# Patient Record
Sex: Female | Born: 1935 | Race: White | Hispanic: No | Smoking: Never smoker
Health system: Southern US, Community
[De-identification: ages and names within clinical notes are randomized; demographics above are authoritative.]

## PROBLEM LIST (undated history)

## (undated) DIAGNOSIS — G8929 Other chronic pain: Secondary | ICD-10-CM

## (undated) DIAGNOSIS — G629 Polyneuropathy, unspecified: Secondary | ICD-10-CM

## (undated) DIAGNOSIS — E119 Type 2 diabetes mellitus without complications: Secondary | ICD-10-CM

## (undated) DIAGNOSIS — E559 Vitamin D deficiency, unspecified: Secondary | ICD-10-CM

## (undated) DIAGNOSIS — E1140 Type 2 diabetes mellitus with diabetic neuropathy, unspecified: Secondary | ICD-10-CM

## (undated) DIAGNOSIS — I10 Essential (primary) hypertension: Secondary | ICD-10-CM

## (undated) DIAGNOSIS — M1990 Unspecified osteoarthritis, unspecified site: Secondary | ICD-10-CM

## (undated) DIAGNOSIS — K769 Liver disease, unspecified: Secondary | ICD-10-CM

## (undated) DIAGNOSIS — E7800 Pure hypercholesterolemia, unspecified: Secondary | ICD-10-CM

## (undated) DIAGNOSIS — Z973 Presence of spectacles and contact lenses: Secondary | ICD-10-CM

## (undated) DIAGNOSIS — M549 Dorsalgia, unspecified: Secondary | ICD-10-CM

## (undated) HISTORY — DX: Polyneuropathy, unspecified: G62.9

## (undated) HISTORY — DX: Essential (primary) hypertension: I10

## (undated) HISTORY — PX: APPENDECTOMY: SHX54

## (undated) HISTORY — DX: Pure hypercholesterolemia, unspecified: E78.00

## (undated) HISTORY — DX: Other chronic pain: G89.29

## (undated) HISTORY — DX: Type 2 diabetes mellitus without complications: E11.9

## (undated) HISTORY — PX: EYE SURGERY: SHX253

## (undated) HISTORY — DX: Unspecified osteoarthritis, unspecified site: M19.90

## (undated) HISTORY — PX: COLONOSCOPY: SHX174

## (undated) HISTORY — DX: Liver disease, unspecified: K76.9

## (undated) HISTORY — DX: Other chronic pain: M54.9

## (undated) HISTORY — DX: Vitamin D deficiency, unspecified: E55.9

## (undated) HISTORY — PX: ABDOMINAL HYSTERECTOMY: SHX81

## (undated) SURGERY — RELEASE, A1 PULLEY, FOR TRIGGER FINGER
Anesthesia: Regional | Laterality: Right

---

## 1965-04-25 HISTORY — PX: BREAST EXCISIONAL BIOPSY: SUR124

## 1991-04-26 HISTORY — PX: BACK SURGERY: SHX140

## 1998-07-13 ENCOUNTER — Ambulatory Visit (HOSPITAL_COMMUNITY): Admission: RE | Admit: 1998-07-13 | Discharge: 1998-07-13 | Payer: Self-pay | Admitting: Obstetrics & Gynecology

## 2000-01-07 ENCOUNTER — Encounter: Payer: Self-pay | Admitting: Family Medicine

## 2000-01-07 ENCOUNTER — Encounter: Admission: RE | Admit: 2000-01-07 | Discharge: 2000-01-07 | Payer: Self-pay | Admitting: Family Medicine

## 2000-12-08 ENCOUNTER — Encounter: Admission: RE | Admit: 2000-12-08 | Discharge: 2000-12-23 | Payer: Self-pay | Admitting: Anesthesiology

## 2001-01-09 ENCOUNTER — Encounter: Admission: RE | Admit: 2001-01-09 | Discharge: 2001-01-09 | Payer: Self-pay | Admitting: Family Medicine

## 2001-01-09 ENCOUNTER — Encounter: Payer: Self-pay | Admitting: Family Medicine

## 2001-02-08 ENCOUNTER — Ambulatory Visit (HOSPITAL_COMMUNITY): Admission: RE | Admit: 2001-02-08 | Discharge: 2001-02-08 | Payer: Self-pay | Admitting: Family Medicine

## 2001-04-05 ENCOUNTER — Encounter: Admission: RE | Admit: 2001-04-05 | Discharge: 2001-04-05 | Payer: Self-pay | Admitting: Family Medicine

## 2001-04-05 ENCOUNTER — Encounter: Payer: Self-pay | Admitting: Family Medicine

## 2001-12-25 ENCOUNTER — Encounter: Admission: RE | Admit: 2001-12-25 | Discharge: 2001-12-25 | Payer: Self-pay | Admitting: Family Medicine

## 2001-12-25 ENCOUNTER — Encounter: Payer: Self-pay | Admitting: Family Medicine

## 2002-08-03 ENCOUNTER — Encounter: Payer: Self-pay | Admitting: Emergency Medicine

## 2002-08-03 ENCOUNTER — Emergency Department (HOSPITAL_COMMUNITY): Admission: EM | Admit: 2002-08-03 | Discharge: 2002-08-03 | Payer: Self-pay | Admitting: Emergency Medicine

## 2002-12-24 ENCOUNTER — Encounter: Admission: RE | Admit: 2002-12-24 | Discharge: 2003-03-24 | Payer: Self-pay | Admitting: Family Medicine

## 2002-12-27 ENCOUNTER — Encounter: Payer: Self-pay | Admitting: Family Medicine

## 2002-12-27 ENCOUNTER — Encounter: Admission: RE | Admit: 2002-12-27 | Discharge: 2002-12-27 | Payer: Self-pay | Admitting: Family Medicine

## 2003-09-15 ENCOUNTER — Encounter: Admission: RE | Admit: 2003-09-15 | Discharge: 2003-12-14 | Payer: Self-pay | Admitting: Family Medicine

## 2004-01-27 ENCOUNTER — Encounter: Admission: RE | Admit: 2004-01-27 | Discharge: 2004-01-27 | Payer: Self-pay | Admitting: Family Medicine

## 2004-05-26 LAB — HM COLONOSCOPY

## 2004-11-24 ENCOUNTER — Encounter: Admission: RE | Admit: 2004-11-24 | Discharge: 2004-11-24 | Payer: Self-pay | Admitting: Family Medicine

## 2005-01-28 ENCOUNTER — Other Ambulatory Visit: Admission: RE | Admit: 2005-01-28 | Discharge: 2005-01-28 | Payer: Self-pay | Admitting: Family Medicine

## 2005-02-07 ENCOUNTER — Encounter: Admission: RE | Admit: 2005-02-07 | Discharge: 2005-02-07 | Payer: Self-pay | Admitting: Family Medicine

## 2005-11-02 ENCOUNTER — Encounter: Admission: RE | Admit: 2005-11-02 | Discharge: 2005-11-18 | Payer: Self-pay | Admitting: Family Medicine

## 2006-02-14 ENCOUNTER — Encounter: Admission: RE | Admit: 2006-02-14 | Discharge: 2006-02-14 | Payer: Self-pay | Admitting: Family Medicine

## 2006-07-19 ENCOUNTER — Ambulatory Visit: Admission: RE | Admit: 2006-07-19 | Discharge: 2006-07-19 | Payer: Self-pay | Admitting: Family Medicine

## 2006-07-19 ENCOUNTER — Ambulatory Visit: Payer: Self-pay | Admitting: Vascular Surgery

## 2006-07-19 ENCOUNTER — Encounter: Payer: Self-pay | Admitting: Vascular Surgery

## 2007-02-19 ENCOUNTER — Encounter: Admission: RE | Admit: 2007-02-19 | Discharge: 2007-02-19 | Payer: Self-pay | Admitting: Family Medicine

## 2008-02-20 ENCOUNTER — Encounter: Admission: RE | Admit: 2008-02-20 | Discharge: 2008-02-20 | Payer: Self-pay | Admitting: Family Medicine

## 2008-06-09 ENCOUNTER — Inpatient Hospital Stay (HOSPITAL_COMMUNITY): Admission: EM | Admit: 2008-06-09 | Discharge: 2008-06-10 | Payer: Self-pay | Admitting: Emergency Medicine

## 2009-02-24 ENCOUNTER — Encounter: Admission: RE | Admit: 2009-02-24 | Discharge: 2009-02-24 | Payer: Self-pay | Admitting: Family Medicine

## 2009-10-25 ENCOUNTER — Emergency Department (HOSPITAL_COMMUNITY): Admission: EM | Admit: 2009-10-25 | Discharge: 2009-10-25 | Payer: Self-pay | Admitting: Emergency Medicine

## 2009-11-06 ENCOUNTER — Encounter: Admission: RE | Admit: 2009-11-06 | Discharge: 2009-11-06 | Payer: Self-pay | Admitting: Family Medicine

## 2010-03-03 ENCOUNTER — Encounter: Admission: RE | Admit: 2010-03-03 | Discharge: 2010-03-03 | Payer: Self-pay | Admitting: Internal Medicine

## 2010-04-13 ENCOUNTER — Encounter
Admission: RE | Admit: 2010-04-13 | Discharge: 2010-04-13 | Payer: Self-pay | Source: Home / Self Care | Attending: Internal Medicine | Admitting: Internal Medicine

## 2010-07-11 LAB — POCT I-STAT, CHEM 8
BUN: 10 mg/dL (ref 6–23)
Calcium, Ion: 1.15 mmol/L (ref 1.12–1.32)
Chloride: 105 mEq/L (ref 96–112)
Creatinine, Ser: 0.9 mg/dL (ref 0.4–1.2)
Glucose, Bld: 139 mg/dL — ABNORMAL HIGH (ref 70–99)
HCT: 40 % (ref 36.0–46.0)
Hemoglobin: 13.6 g/dL (ref 12.0–15.0)
Potassium: 3.9 mEq/L (ref 3.5–5.1)
Sodium: 138 mEq/L (ref 135–145)
TCO2: 28 mmol/L (ref 0–100)

## 2010-07-11 LAB — POCT CARDIAC MARKERS
CKMB, poc: <1 ng/mL — ABNORMAL LOW (ref 1.0–8.0)
Myoglobin, poc: 71.7 ng/mL (ref 12–200)
Troponin i, poc: <0.05 ng/mL (ref 0.00–0.09)

## 2010-08-10 LAB — POCT I-STAT, CHEM 8
BUN: 28 mg/dL — ABNORMAL HIGH (ref 6–23)
Calcium, Ion: 0.92 mmol/L — ABNORMAL LOW (ref 1.12–1.32)
Chloride: 106 mEq/L (ref 96–112)
Creatinine, Ser: 0.7 mg/dL (ref 0.4–1.2)
Glucose, Bld: 247 mg/dL — ABNORMAL HIGH (ref 70–99)
HCT: 42 % (ref 36.0–46.0)
Hemoglobin: 14.3 g/dL (ref 12.0–15.0)
Potassium: 4.6 mEq/L (ref 3.5–5.1)
Sodium: 135 mEq/L (ref 135–145)
TCO2: 24 mmol/L (ref 0–100)

## 2010-08-10 LAB — DIFFERENTIAL
Basophils Absolute: 0 10*3/uL (ref 0.0–0.1)
Basophils Relative: 0 % (ref 0–1)
Eosinophils Absolute: 0.1 10*3/uL (ref 0.0–0.7)
Eosinophils Relative: 0 % (ref 0–5)
Lymphocytes Relative: 4 % — ABNORMAL LOW (ref 12–46)
Lymphs Abs: 0.8 10*3/uL (ref 0.7–4.0)
Monocytes Absolute: 0.5 10*3/uL (ref 0.1–1.0)
Monocytes Relative: 3 % (ref 3–12)
Neutro Abs: 17.4 10*3/uL — ABNORMAL HIGH (ref 1.7–7.7)
Neutrophils Relative %: 93 % — ABNORMAL HIGH (ref 43–77)

## 2010-08-10 LAB — BASIC METABOLIC PANEL
BUN: 8 mg/dL (ref 6–23)
CO2: 29 mEq/L (ref 19–32)
Calcium: 8.6 mg/dL (ref 8.4–10.5)
Chloride: 96 mEq/L (ref 96–112)
Creatinine, Ser: 0.72 mg/dL (ref 0.4–1.2)
GFR calc Af Amer: >60 mL/min (ref >60)
GFR calc non Af Amer: >60 mL/min (ref >60)
Glucose, Bld: 87 mg/dL (ref 70–99)
Potassium: 4.1 mEq/L (ref 3.5–5.1)
Sodium: 135 mEq/L (ref 135–145)

## 2010-08-10 LAB — CK TOTAL AND CKMB (NOT AT ARMC)
CK, MB: 0.8 ng/mL (ref 0.3–4.0)
Relative Index: INVALID (ref 0.0–2.5)
Total CK: 52 U/L (ref 7–177)

## 2010-08-10 LAB — CBC
HCT: 40.1 % (ref 36.0–46.0)
HCT: 36.3 % (ref 36.0–46.0)
Hemoglobin: 14 g/dL (ref 12.0–15.0)
Hemoglobin: 12.8 g/dL (ref 12.0–15.0)
MCHC: 35 g/dL (ref 30.0–36.0)
MCHC: 35.1 g/dL (ref 30.0–36.0)
MCV: 96.3 fL (ref 78.0–100.0)
MCV: 96.9 fL (ref 78.0–100.0)
Platelets: 211 10*3/uL (ref 150–400)
Platelets: 177 10*3/uL (ref 150–400)
RBC: 4.17 MIL/uL (ref 3.87–5.11)
RBC: 3.75 MIL/uL — ABNORMAL LOW (ref 3.87–5.11)
RDW: 12.8 % (ref 11.5–15.5)
RDW: 13.2 % (ref 11.5–15.5)
WBC: 18.7 10*3/uL — ABNORMAL HIGH (ref 4.0–10.5)
WBC: 12.4 10*3/uL — ABNORMAL HIGH (ref 4.0–10.5)

## 2010-08-10 LAB — COMPREHENSIVE METABOLIC PANEL
ALT: 14 U/L (ref 0–35)
AST: 40 U/L — ABNORMAL HIGH (ref 0–37)
Albumin: 3.7 g/dL (ref 3.5–5.2)
Alkaline Phosphatase: 64 U/L (ref 39–117)
BUN: 22 mg/dL (ref 6–23)
CO2: 25 mEq/L (ref 19–32)
Calcium: 9.3 mg/dL (ref 8.4–10.5)
Chloride: 100 mEq/L (ref 96–112)
Creatinine, Ser: 0.75 mg/dL (ref 0.4–1.2)
GFR calc Af Amer: >60 mL/min (ref >60)
GFR calc non Af Amer: >60 mL/min (ref >60)
Glucose, Bld: 256 mg/dL — ABNORMAL HIGH (ref 70–99)
Potassium: 4.5 mEq/L (ref 3.5–5.1)
Sodium: 137 mEq/L (ref 135–145)
Total Bilirubin: 1.3 mg/dL — ABNORMAL HIGH (ref 0.3–1.2)
Total Protein: 7.1 g/dL (ref 6.0–8.3)

## 2010-08-10 LAB — GLUCOSE, CAPILLARY
Glucose-Capillary: 171 mg/dL — ABNORMAL HIGH (ref 70–99)
Glucose-Capillary: 187 mg/dL — ABNORMAL HIGH (ref 70–99)
Glucose-Capillary: 200 mg/dL — ABNORMAL HIGH (ref 70–99)
Glucose-Capillary: 245 mg/dL — ABNORMAL HIGH (ref 70–99)
Glucose-Capillary: 188 mg/dL — ABNORMAL HIGH (ref 70–99)
Glucose-Capillary: 230 mg/dL — ABNORMAL HIGH (ref 70–99)
Glucose-Capillary: 85 mg/dL (ref 70–99)

## 2010-08-10 LAB — URINALYSIS, ROUTINE W REFLEX MICROSCOPIC
Bilirubin Urine: NEGATIVE
Glucose, UA: >1000 mg/dL — AB
Hgb urine dipstick: NEGATIVE
Ketones, ur: 15 mg/dL — AB
Nitrite: NEGATIVE
Protein, ur: 100 mg/dL — AB
Specific Gravity, Urine: 1.033 — ABNORMAL HIGH (ref 1.005–1.030)
Urobilinogen, UA: 0.2 mg/dL (ref 0.0–1.0)
pH: 6.5 (ref 5.0–8.0)

## 2010-08-10 LAB — LIPID PANEL
Cholesterol: 210 mg/dL — ABNORMAL HIGH (ref 0–200)
HDL: 43 mg/dL (ref >39)
LDL Cholesterol: 107 mg/dL — ABNORMAL HIGH (ref 0–99)
Total CHOL/HDL Ratio: 4.9 RATIO
Triglycerides: 298 mg/dL — ABNORMAL HIGH (ref <150)
VLDL: 60 mg/dL — ABNORMAL HIGH (ref 0–40)

## 2010-08-10 LAB — CARDIAC PANEL(CRET KIN+CKTOT+MB+TROPI)
CK, MB: 1.2 ng/mL (ref 0.3–4.0)
CK, MB: 1.2 ng/mL (ref 0.3–4.0)
CK, MB: 0.9 ng/mL (ref 0.3–4.0)
Relative Index: 1.1 (ref 0.0–2.5)
Relative Index: INVALID (ref 0.0–2.5)
Relative Index: 0.8 (ref 0.0–2.5)
Total CK: 106 U/L (ref 7–177)
Total CK: 79 U/L (ref 7–177)
Total CK: 119 U/L (ref 7–177)
Troponin I: <0.01 ng/mL (ref 0.00–0.06)
Troponin I: <0.01 ng/mL (ref 0.00–0.06)
Troponin I: <0.01 ng/mL (ref 0.00–0.06)

## 2010-08-10 LAB — POCT CARDIAC MARKERS
CKMB, poc: <1 ng/mL — ABNORMAL LOW (ref 1.0–8.0)
CKMB, poc: <1 ng/mL — ABNORMAL LOW (ref 1.0–8.0)
Myoglobin, poc: 73 ng/mL (ref 12–200)
Myoglobin, poc: 75.3 ng/mL (ref 12–200)
Troponin i, poc: <0.05 ng/mL (ref 0.00–0.09)
Troponin i, poc: <0.05 ng/mL (ref 0.00–0.09)

## 2010-08-10 LAB — APTT: aPTT: 30 seconds (ref 24–37)

## 2010-08-10 LAB — URINE MICROSCOPIC-ADD ON

## 2010-08-10 LAB — PROTIME-INR
INR: 1 (ref 0.00–1.49)
Prothrombin Time: 13.3 seconds (ref 11.6–15.2)

## 2010-08-10 LAB — TSH: TSH: 0.664 u[IU]/mL (ref 0.350–4.500)

## 2010-08-10 LAB — LIPASE, BLOOD: Lipase: 20 U/L (ref 11–59)

## 2010-08-10 LAB — D-DIMER, QUANTITATIVE (NOT AT ARMC): D-Dimer, Quant: 0.35 ug/mL-FEU (ref 0.00–0.48)

## 2010-08-10 LAB — HEMOGLOBIN A1C
Hgb A1c MFr Bld: 8 % — ABNORMAL HIGH (ref 4.6–6.1)
Mean Plasma Glucose: 183 mg/dL

## 2010-08-10 LAB — TROPONIN I: Troponin I: 0.01 ng/mL (ref 0.00–0.06)

## 2010-09-07 NOTE — H&P (Signed)
NAMEJAHARI, [name]                ACCOUNT NO.:  [id number]   MEDICAL RECORD NO.:  [id number]          PATIENT TYPE:  EMS   LOCATION:  MAJO                         FACILITY:  [facility]   PHYSICIAN:  Farris Has, MDDATE OF BIRTH:  02-Apr-1930   DATE OF ADMISSION:  06/09/2008  DATE OF DISCHARGE:                              HISTORY & PHYSICAL   PRIMARY CARE PHYSICIAN:  Emeline General. White, M.D.   CHIEF COMPLAINT:  Abdominal pain and chest pain.  The patient is a 77-  year-old female with history of diabetes and hypertension who reports  this past one week she had been having intermittent chest pain which is  not positional, sometimes worse with deep inspiration, sometimes not,  but he had been hurting consistently all day.  She cannot quite qualify  it or describe it.  She described it as a hurts.  States that her left  arm has been hurting as well.  Pain does not appear to be worse with  exertion.  She usually does not get chest pain or shortness of breath  with any exertion.  Has no coronary artery disease history.  Also, the  patient has been endorsing abdominal pain that has been happening for  about as long.  No fever, no chills, no constipation, no diarrhea.  The  abdominal pain is also hard to describe.  She says that there is some  right upper quadrant epigastric pain and as well as some suprapubic  pain.  She notes that there may have been some blood in her urine for  the past few days.  Otherwise, review of systems unremarkable.  No  shortness of breath.  No wheezing.  No lower extremity swelling.  She  does have history of peripheral neuropathy for which she takes  Neurontin, and that has been under control.   SOCIAL HISTORY:  The patient does not smoke or drink and does not abuse  drugs.  Lives at home with her husband.   FAMILY HISTORY:  Noncontributory.   PAST MEDICAL HISTORY:  1. Chronic back pain.  2. Diabetes.  3. Hypercholesteremia.  4. Hypertension.  5.  Neuropathy.   ALLERGIES:  MORPHINE.   MEDICATIONS:  1. Actos 30 mg daily.  2. Darvocet as needed.  3. Neurontin 300 mg q.i.d.  4. Benicar 20 mg daily.  5. Aspirin 81 mg daily.  6. Humalog 20 mg subcu p.m.  7. Metformin 500 mg b.i.d.  8. The patient may be taking some other medications but cannot      remember what.   PHYSICAL EXAMINATION:  VITAL SIGNS:  Temperature 99.9, blood pressure  179/64, pulse 88, respirations 19, satting 94% on room air.  The patient  appears to be in no acute distress, currently states that she is chest  pain free.  HEAD:  Nontraumatic.  Somewhat dry mucous membranes but normal skin  turgor.  LUNGS:  Clear to auscultation bilaterally.  HEART:  Regular rate and rhythm.  No murmurs, rubs or gallops.  ABDOMEN:  Slight epigastric tenderness noted.  Otherwise soft.  No  rebound tenderness noted.  Normal  bowel sounds throughout.  LOWER EXTREMITIES:  No clubbing, cyanosis or edema.  NEUROLOGIC:  Intact.   LABORATORY DATA:  White blood cell count 17.7, hemoglobin 14, sodium  137, potassium 4.5, creatinine 0.75 with a bilirubin 1.3, alk-phos 64,  AST 40, ALT 14, lipase 20.  Cardiac enzymes negative.  UA showing 21-50  white blood cells.  Chest x-ray showing pulmonary nodule.  Will need a  repeat to confirm.  CT scan of abdomen and pelvis showing liver lesion  which cannot be further qualified on the CT scan, MRI recommended, and  also gallbladder is distended, but no evidence of infection.  EKG showed  normal sinus rhythm.   ASSESSMENT/PLAN:  This is a 77 year old female with past medical history  of diabetes.  Presents with UTI, chest pain and abdominal pain with  possible liver lesion and distended gallbladder.   1. Chest pain.  The patient has a risk factors such as diabetes and      hypertension.  Will cycle cardiac enzymes.  The chest pain is      currently hard to quantify.  Says it has been persistent at least a      week or so.  Will check fasting  lipid panel check hemoglobin A1c.      Since sometimes it is pleuritic, will check a D-dimer.  2. Abdominal pain.  Etiology is not quite clear.  It is suprapubic      pain.  Could be related to UTI.  The epigastric pain may be related      to gallbladder distention revealed no evidence of cholecystitis.      Will continue to watch CMP.  Will obtain right upper quadrant      ultrasound to try to further evaluate gallbladder.  If there is      evidence of acute cholecystitis, would need surgical consult.  3. Liver lesions.  Will need MRI in the future.  This could be done      either as an outpatient or inpatient if the patient is staying here      for a long period time.  4. Diabetes.  Will switch to Lantus while in-house and continue      sliding scale insulin.  Will hold Actos.  5. Urinary tract infection.  Will cover with Cipro for now and await      urine culture.  6. New diabetic neuropathy.  Continue Neurontin.  7. Prophylaxis.  Protonix and Lovenox.      Farris Has, MD  Electronically Signed     AVD/MEDQ  D:  06/09/2008  T:  06/09/2008  Job:  [id number]   cc:   Emeline General. Dema Severin, M.D.

## 2010-09-07 NOTE — Consult Note (Signed)
NAMERIANA, COLLINSON                ACCOUNT NO.:  [id number]   MEDICAL RECORD NO.:  [id number]          PATIENT TYPE:  INP   LOCATION:  2010                         FACILITY:  [facility]   PHYSICIAN:  Jerline Pain, MD      DATE OF BIRTH:  [birth year]   DATE OF CONSULTATION:  06/09/2008  DATE OF DISCHARGE:                                 CONSULTATION   PRIMARY CARE PHYSICIAN:  Emeline General. White, MD   REFERRING PHYSICIAN:  Corinna L. Conley Canal, MD, [facility] hospitalist.   REASON FOR REFERRAL:  Evaluation of chest pain.   HISTORY OF PRESENT ILLNESS:  This is a 77 year old female with diabetes,  hypertension, hypercholesterolemia here with approximately 3- to 4-week  history of abdominal discomfort as well as chronic cough, which led to  nausea and vomiting and concomitant left breast chest discomfort.  The  chest pain occurred after 3 weeks of coughing and was not associated  with any diaphoresis, shortness of breath.  The chest pain has been  indolent, usually lasting several weeks in duration.  Occasionally, the  chest pain is under her left breast and radiates down her left arm.  Duration can be hours to days.  No specific exacerbating factors such as  exertion.  Pain is mild to moderate in intensity.  However currently,  she does not have any significant discomfort.  She is not tender to  palpation.  She is denying any rash.  No significant fevers.  Yesterday  after eating breakfast, she started belching and vomiting and aching  all over her abdomen.   She had had some suprapubic and right upper  quadrant pain.  Since she has been here, she has been diagnosed with  urinary tract infection.  Ultrasound of her gallbladder shows distended  gallbladder with no stones.  She does have hepatic steatosis.  Gallbladder was distended on CT of her abdomen and pelvis.  Her EKG was  sinus rhythm without any abnormalities.  There was no change from prior  ECG.  No ST changes.  No Q waves.  She has a son  who in his early 53s  had an  myocardial infarction.  Several years ago, she had had a stress  test, which was normal per the patient.  Her husband sees Dr. Mar Daring  and he is status post bypass.   PAST MEDICAL HISTORY:  1. Hypertension.  2. Hyperlipidemia.  3. Diabetes with lower extremity neuropathy.  4. Chronic back pain.   ALLERGIES:  MORPHINE.   MEDICATIONS:  1. Aspirin 81 mg a day.  2. Pravastatin 40 mg a day.  3. Cipro.  4. Neurontin 600 mg nightly.  5. Lantus 10 units subcu nightly.  6. Benicar 20 mg a day.  7. Protonix 40 mg a day, currently on IV heparin for DVT prophylaxis.   At home, she is also on:  1. Actos 30 mg a day.  2. Neurontin 300 mg 4 times a day.  3. Metformin 500 mg twice a day.   SOCIAL HISTORY:  She lives in [place] with her husband.  Note,  denies any tobacco, alcohol, or drug use ever.   FAMILY HISTORY:  No early family history of coronary artery disease;  however, she has 11 siblings and 61 of them have died of cancer.   REVIEW OF SYSTEMS:  Positive for fatigue, chest pain, nausea, vomiting,  abdominal pain.  She denies any hematemesis, melena, fevers, rashes,  syncope, palpitations.  Unless specified above, all other 12 review of  systems negative.   PHYSICAL EXAMINATION:  VITAL SIGNS:  Temperature 97.9, pulse 80,  respirations 16, blood pressure 151/71, oxygen saturation 96% on room  air.  GENERAL:  Alert and oriented x3, in no acute distress, pleasant.  EYES:  Well-perfused conjunctivae.  EOMI.  No scleral icterus.  NECK:  Supple.  No lymphadenopathy.  No thyromegaly.  No carotid bruits.  No JVD noted.  CARDIOVASCULAR:  Regular rate and rhythm without any appreciable  murmurs, rubs, or gallops.  Normal PMI.  LUNGS:  Clear to auscultation bilaterally.  Normal respiratory effort.  No wheezes.  No rales.  ABDOMEN:  Mild tenderness diffusely with positive bowel sounds.  No  hepatosplenomegaly noted.  No bruits.  EXTREMITIES:  No  clubbing, cyanosis, or edema.  Normal distal pulses.  SKIN:  Warm, dry, and intact.  No rashes.  PSYCH:  Normal affect.  NEUROLOGIC:  Nonfocal.   LABORATORY DATA:  Hemoglobin A1c 8.0 mildly elevated.  TSH 0.664, which  is normal.  Cardiac biomarkers last set 10:35 this morning were normal.  First set was at just after midnight and was normal.  She has had a 10-  hour span.  Total cholesterol 210, triglycerides elevated at 298, HDL  43, LDL 107.  PT/PTT were normal.  D-dimer was normal at 0.35.  Urinalysis positive.  Lipase was negative.  Liver function showed  bilirubin of 1.3, alkaline phosphatase of 64, AST of 40, ALT of 14.  Albumin was normal at 3.7.  Chest x-ray shows elevation of the right  hemidiaphragm.  No active disease, personally reviewed.  Ultrasound of  the abdomen shows gallbladder is distended without any gallstones, wall  thickening, or sonographic Murphy sign.  There is no biliary dilatation.  There is hepatic steatosis without demonstrated focal lesion on  ultrasound.  There was no demonstrated acute abdominal finding.  CT of  her abdomen showed prior hysterectomy.  No lymphadenopathy and prior  cage grafts at L4 and S1.  No acute abnormalities.  EKG, personally  viewed as described above in HPI, normal sinus rhythm without any  abnormalities.   ASSESSMENT AND PLAN:  This is a 77 year old female with diabetes,  hypertension, hyperlipidemia with atypical chest pain and abdominal  discomfort, chronic.  1. Chest pain - atypical.  So far ECG and cardiac biomarkers are      reassuring.  I would like to obtain one more set prior to      discharge.  Given her multiple cardiac risk factors including      diabetes mellitus which is a coronary artery disease equivalent, I      do believe that she warrants further cardiac testing with nuclear      stress test which can be performed on an outpatient basis to allow      expedited discharge.  Continue to modify risk factors.  I  agree      with aspirin and statin medication pravastatin.  I am curious if      she has had any prior statin intolerances in the past.  She is also  taking fish oil at home given her hypertriglyceridemia.  She may      require more potent formulation such as fenofibrate in the future.      In regards to her abdominal discomfort, she does have a distended      gallbladder; however, no appreciable stones are present.  In      regards to her diabetes, her hemoglobin A1c was 8.0, which is      elevated.  Goal was less than 7.  She may require increase in her      Lantus or other insulin at home.  2. Leukocytosis - white count was elevated earlier this morning at      18.7 indicative of an inflammatory response.  So far, there are no      acute abnormalities in regards to her abdominal process.  She does      have urinary tract infection, which may be the cause of this      leukocytosis.  She is currently being treated accordingly.      Jerline Pain, MD  Electronically Signed     MCS/MEDQ  D:  06/09/2008  T:  06/10/2008  Job:  [id number]   cc:   Corinna L. Conley Canal, [place]. Dema Severin, M.D.

## 2010-09-10 NOTE — H&P (Signed)
[facility]  Patient:    [name]                       MRN: [id number] Adm. Date:  [id number] Attending:  Nicholaus Bloom CC:         Biagio Borg, M.D.   History and Physical  NEW PATIENT EVALUATION  HISTORY OF PRESENT ILLNESS:  [name] is a very pleasant 77 year old who was sent to [place] by Dr. Jacklynn Ganong for evaluation and management of chronic back pain syndrome on the basis of degenerative disk disease.  The patient states that she has a history of back pain which began in the late 1990s when she was initially followed by Dr. Alfred Levins with degenerative disk disease.  She was sent to Dr. Gilmer Mor, who gave her epidural steroid injections, which partially helped her pain, and eventually she required surgical intervention by Dr. Glenna Fellows in 1998.  Subsequent to this she went back to Dr. Asa Lente, but Dr. Carma Lair group no longer covered partners, so she was sent to Dr. [name], who has been treating her for the past year with Ultram and nortriptyline, and she has done fairly well with pain levels around 3/10.  She would like to get off these medications nevertheless.  The patient describes her pain as a discomfort which is increased by movements, weightbearing, or housework, and improved by sitting still or relaxing.  There is no numbness or tingling, bowel or bladder incontinence, or weakness.  She has dysesthesias of her feet, which have been worked up with nerve conduction studies by Dr. Sanjuan Dame, but the results are not available.  She has had an MRI in the past year, and the results of this were not available at the time of dictation.  She does not recall other medications used, but she does recall that Dr. Sanjuan Dame did not want her on Vioxx.  She is not currently taking a nonsteroidal anti-inflammatory agent.  MEDICATIONS:  Ultram 50 mg one to two p.o. q.6h., nortriptyline  25 mg two in the evening, Lipitor, and  Premarin.  ALLERGIES:  MORPHINE causes itching.  FAMILY HISTORY:  Positive for diabetes, coronary artery disease, emphysema, cancer, hypertension, peripheral vascular disease, and strokes.  PAST SURGICAL HISTORY:  Significant for three C-sections, lumpectomy, and her back surgery.  ACTIVE MEDICAL PROBLEMS:  Hyperlipidemia and arthralgias of her hands on the basis of osteoarthritis.  SOCIAL HISTORY:  The patient is a nonsmoker, nondrinker.  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  She does part-time.  REVIEW OF SYSTEMS:  GENERAL:  Negative.  HEAD:  Significant for rare headaches.  EYES:  Negative.  NOSE, MOUTH, THROAT:  Negative.  EARS: Negative.  PULMONARY:  Negative.  CARDIOVASCULAR:  Negative. GASTROINTESTINAL:  Negative.  LIVER:  Negative.  GENITOURINARY:  Negative. MUSCULOSKELETAL:  See HPI.  NEUROLOGIC:  See HPI.  No history of seizure or stroke.  CUTANEOUS:  Significant for redness of her feet, which has been present for about the past two months, which is improved by putting her feet in cold water.  HEMATOLOGIC:  Negative.  ENDOCRINE:  Negative.  PSYCHIATRIC: Negative.  ALLERGY/IMMUNOLOGIC:  Positive for allergies to bug bites.  PHYSICAL EXAMINATION:  VITAL SIGNS:  Blood pressure 140/63, heart rate is 83, respiratory rate 16, O2 saturation is 96%, pain level is 3/10.  GENERAL:  This is a pleasant female who looks younger than her stated age.  HEENT:  Head was  normocephalic, atraumatic.  Eyes:  Extraocular movements intact, conjunctivae and sclerae clear.  Nose:  Patent nares without discharge.  Oropharynx is free of lesions.  NECK:  Supple without lymphadenopathy.  CHEST:  Lungs were clear to auscultation and percussion.  BREASTS, ABDOMEN, PELVIC, RECTAL:  Not performed.  BACK:  Well-healed surgical scar with intact forward flexion and hyperextension.  Straight leg raise signs were negative.  EXTREMITIES:  The patient demonstrated some mild bony enlargement of the  PIPs and DIPs of her hands with good range of motion of her joints.  Radial pulses and dorsalis pedis pulses were 2+ and symmetric.  She did have some reddish discoloration of her feet but no increased warmth.  NEUROLOGIC:  The patient was oriented to person, place, time, and reason for visit.  Deep tendon reflexes were symmetric in the upper and lower extremities.  They were hypoactive in the lower extremities.  Straight leg raise signs were negative.  Motor was 5/5 with symmetric bulk and tone. Sensory was significant for attenuated vibratory sense in both lower extremities, with intact pinprick.  Coordination was grossly intact.  IMPRESSION: 1. Chronic low back pain syndrome on the basis of lumbar degenerative disk    disease, status post lumbar laminectomy. 2. Dysesthesias of the feet with unknown results of nerve conduction studies,    which seem improved with nortriptyline. 3. Other health problems per Dr. Valere Dross, which include hyperlipidemia and    perimenopausal.  DISPOSITION: 1. I advised the patient that she should continue on her current medical    regimen, since it seems to be helping her fairly significantly, and I have    no problem with writing for the Ultram in the generic form and gave her a    prescription for 50 mg one to two p.o. q.6h. p.r.n., #200 with two refills. 2. She does not need a refill on her nortriptyline, but I encouraged her to    continue with this. 3. Trial of Celebrex 100 mg one p.o. q.d. versus glucosamine with MSG.    She will try one or the other.  I gave her enough samples to last two    months with the Celebrex and made her fully aware of the potential side    effects of this. 4. We will obtain the records from Dr. Sanjuan Dame and the results of her nerve    conduction studies if possible. 5. Follow up with me in eight weeks. 6. I have encouraged her to get [name] book on the back to see    whether we can get her on a more regimented  exercise regimen and see if    we can help with her back problems to a greater extent with this route.     She seems very open to this approach.  She plans to follow up with [place]    up at the new office at [facility].  DD:  12/08/00 TD:  12/09/00 Job: [id number]

## 2011-01-21 ENCOUNTER — Other Ambulatory Visit: Payer: Self-pay | Admitting: Internal Medicine

## 2011-01-21 DIAGNOSIS — Z1231 Encounter for screening mammogram for malignant neoplasm of breast: Secondary | ICD-10-CM

## 2011-02-16 ENCOUNTER — Emergency Department (HOSPITAL_COMMUNITY)
Admission: EM | Admit: 2011-02-16 | Discharge: 2011-02-16 | Disposition: A | Discharge status: Home / Self Care | Payer: Medicare Other | Attending: Emergency Medicine | Admitting: Emergency Medicine

## 2011-02-16 ENCOUNTER — Other Ambulatory Visit: Payer: Self-pay | Admitting: Internal Medicine

## 2011-02-16 ENCOUNTER — Emergency Department (HOSPITAL_COMMUNITY): Payer: Medicare Other

## 2011-02-16 ENCOUNTER — Inpatient Hospital Stay (INDEPENDENT_AMBULATORY_CARE_PROVIDER_SITE_OTHER)
Admission: RE | Admit: 2011-02-16 | Discharge: 2011-02-16 | Disposition: A | Discharge status: Home / Self Care | Payer: Medicare Other | Source: Ambulatory Visit | Attending: Family Medicine | Admitting: Family Medicine

## 2011-02-16 DIAGNOSIS — M79609 Pain in unspecified limb: Secondary | ICD-10-CM | POA: Insufficient documentation

## 2011-02-16 DIAGNOSIS — Z79899 Other long term (current) drug therapy: Secondary | ICD-10-CM

## 2011-02-16 DIAGNOSIS — E7800 Pure hypercholesterolemia, unspecified: Secondary | ICD-10-CM | POA: Insufficient documentation

## 2011-02-16 DIAGNOSIS — E119 Type 2 diabetes mellitus without complications: Secondary | ICD-10-CM | POA: Insufficient documentation

## 2011-02-16 DIAGNOSIS — M549 Dorsalgia, unspecified: Secondary | ICD-10-CM | POA: Insufficient documentation

## 2011-02-16 DIAGNOSIS — N644 Mastodynia: Secondary | ICD-10-CM

## 2011-02-16 DIAGNOSIS — Z7982 Long term (current) use of aspirin: Secondary | ICD-10-CM | POA: Insufficient documentation

## 2011-02-16 DIAGNOSIS — N63 Unspecified lump in unspecified breast: Secondary | ICD-10-CM

## 2011-02-16 DIAGNOSIS — Z794 Long term (current) use of insulin: Secondary | ICD-10-CM | POA: Insufficient documentation

## 2011-02-16 DIAGNOSIS — R51 Headache: Secondary | ICD-10-CM

## 2011-02-16 DIAGNOSIS — G8929 Other chronic pain: Secondary | ICD-10-CM | POA: Insufficient documentation

## 2011-02-16 DIAGNOSIS — I10 Essential (primary) hypertension: Secondary | ICD-10-CM | POA: Insufficient documentation

## 2011-02-16 DIAGNOSIS — M5020 Other cervical disc displacement, unspecified cervical region: Secondary | ICD-10-CM | POA: Insufficient documentation

## 2011-02-16 DIAGNOSIS — M25519 Pain in unspecified shoulder: Secondary | ICD-10-CM | POA: Insufficient documentation

## 2011-02-16 DIAGNOSIS — N6459 Other signs and symptoms in breast: Secondary | ICD-10-CM

## 2011-02-16 LAB — GLUCOSE, CAPILLARY: Glucose-Capillary: 257 mg/dL — ABNORMAL HIGH (ref 70–99)

## 2011-02-22 ENCOUNTER — Other Ambulatory Visit: Payer: Self-pay | Admitting: Neurosurgery

## 2011-02-22 DIAGNOSIS — M5020 Other cervical disc displacement, unspecified cervical region: Secondary | ICD-10-CM

## 2011-02-25 ENCOUNTER — Ambulatory Visit
Admission: RE | Admit: 2011-02-25 | Discharge: 2011-02-25 | Disposition: A | Discharge status: Home / Self Care | Payer: Medicare Other | Source: Ambulatory Visit | Attending: Neurosurgery | Admitting: Neurosurgery

## 2011-02-25 DIAGNOSIS — M5020 Other cervical disc displacement, unspecified cervical region: Secondary | ICD-10-CM

## 2011-02-25 MED ORDER — IOHEXOL 300 MG/ML  SOLN
1.0000 mL | Freq: Once | INTRAMUSCULAR | Status: AC | PRN
  Administered 2011-02-25: 1 mL via EPIDURAL

## 2011-02-25 MED ORDER — TRIAMCINOLONE ACETONIDE 40 MG/ML IJ SUSP (RADIOLOGY)
60.0000 mg | Freq: Once | INTRAMUSCULAR | Status: AC
  Administered 2011-02-25: 60 mg via EPIDURAL

## 2011-03-01 ENCOUNTER — Ambulatory Visit
Admit: 2011-03-01 | Discharge: 2011-03-01 | Disposition: A | Discharge status: Home / Self Care | Payer: Medicare Other | Attending: Internal Medicine | Admitting: Internal Medicine

## 2011-03-01 DIAGNOSIS — N63 Unspecified lump in unspecified breast: Secondary | ICD-10-CM

## 2011-03-01 DIAGNOSIS — N644 Mastodynia: Secondary | ICD-10-CM

## 2011-03-07 ENCOUNTER — Ambulatory Visit: Payer: Self-pay

## 2011-03-08 ENCOUNTER — Other Ambulatory Visit: Payer: Self-pay | Admitting: Internal Medicine

## 2011-03-08 ENCOUNTER — Ambulatory Visit
Admission: RE | Admit: 2011-03-08 | Discharge: 2011-03-08 | Disposition: A | Discharge status: Home / Self Care | Payer: Medicare Other | Source: Ambulatory Visit | Attending: Internal Medicine | Admitting: Internal Medicine

## 2011-03-08 DIAGNOSIS — E041 Nontoxic single thyroid nodule: Secondary | ICD-10-CM

## 2011-03-10 ENCOUNTER — Other Ambulatory Visit: Payer: Self-pay | Admitting: Internal Medicine

## 2011-03-10 DIAGNOSIS — E041 Nontoxic single thyroid nodule: Secondary | ICD-10-CM

## 2011-03-22 ENCOUNTER — Other Ambulatory Visit (HOSPITAL_COMMUNITY)
Admission: RE | Admit: 2011-03-22 | Discharge: 2011-03-22 | Disposition: A | Discharge status: Home / Self Care | Payer: Medicare Other | Source: Ambulatory Visit | Attending: Diagnostic Radiology | Admitting: Diagnostic Radiology

## 2011-03-22 ENCOUNTER — Ambulatory Visit
Admission: RE | Admit: 2011-03-22 | Discharge: 2011-03-22 | Disposition: A | Discharge status: Home / Self Care | Payer: Medicare Other | Source: Ambulatory Visit | Attending: Internal Medicine | Admitting: Internal Medicine

## 2011-03-22 DIAGNOSIS — E049 Nontoxic goiter, unspecified: Secondary | ICD-10-CM | POA: Insufficient documentation

## 2011-03-22 DIAGNOSIS — E041 Nontoxic single thyroid nodule: Secondary | ICD-10-CM

## 2011-06-10 ENCOUNTER — Other Ambulatory Visit: Payer: Self-pay | Admitting: Internal Medicine

## 2011-06-14 ENCOUNTER — Ambulatory Visit
Admission: RE | Admit: 2011-06-14 | Discharge: 2011-06-14 | Disposition: A | Discharge status: Home / Self Care | Payer: Medicare Other | Source: Ambulatory Visit | Attending: Internal Medicine | Admitting: Internal Medicine

## 2011-06-14 ENCOUNTER — Encounter: Payer: Self-pay | Admitting: Gastroenterology

## 2011-06-20 ENCOUNTER — Other Ambulatory Visit (INDEPENDENT_AMBULATORY_CARE_PROVIDER_SITE_OTHER): Payer: Medicare Other

## 2011-06-20 ENCOUNTER — Ambulatory Visit (INDEPENDENT_AMBULATORY_CARE_PROVIDER_SITE_OTHER): Payer: Medicare Other | Admitting: Gastroenterology

## 2011-06-20 ENCOUNTER — Encounter: Payer: Self-pay | Admitting: Gastroenterology

## 2011-06-20 DIAGNOSIS — R109 Unspecified abdominal pain: Secondary | ICD-10-CM

## 2011-06-20 LAB — CBC WITH DIFFERENTIAL/PLATELET
Basophils Absolute: 0.1 10*3/uL (ref 0.0–0.1)
Basophils Relative: 0.5 % (ref 0.0–3.0)
Eosinophils Absolute: 0.3 10*3/uL (ref 0.0–0.7)
Eosinophils Relative: 1.9 % (ref 0.0–5.0)
HCT: 41.1 % (ref 36.0–46.0)
Hemoglobin: 13.6 g/dL (ref 12.0–15.0)
Lymphocytes Relative: 29.8 % (ref 12.0–46.0)
Lymphs Abs: 3.9 10*3/uL (ref 0.7–4.0)
MCHC: 33.1 g/dL (ref 30.0–36.0)
MCV: 95.7 fl (ref 78.0–100.0)
Monocytes Absolute: 1 10*3/uL (ref 0.1–1.0)
Monocytes Relative: 7.2 % (ref 3.0–12.0)
Neutro Abs: 8 10*3/uL — ABNORMAL HIGH (ref 1.4–7.7)
Neutrophils Relative %: 60.6 % (ref 43.0–77.0)
Platelets: 307 10*3/uL (ref 150.0–400.0)
RBC: 4.3 Mil/uL (ref 3.87–5.11)
RDW: 12.4 % (ref 11.5–14.6)
WBC: 13.2 10*3/uL — ABNORMAL HIGH (ref 4.5–10.5)

## 2011-06-20 LAB — COMPREHENSIVE METABOLIC PANEL
ALT: 23 U/L (ref 0–35)
AST: 34 U/L (ref 0–37)
Albumin: 4.5 g/dL (ref 3.5–5.2)
Alkaline Phosphatase: 54 U/L (ref 39–117)
BUN: 19 mg/dL (ref 6–23)
CO2: 30 mEq/L (ref 19–32)
Calcium: 10.4 mg/dL (ref 8.4–10.5)
Chloride: 101 mEq/L (ref 96–112)
Creatinine, Ser: 1.3 mg/dL — ABNORMAL HIGH (ref 0.4–1.2)
GFR: 42.71 mL/min — ABNORMAL LOW (ref >60.00)
Glucose, Bld: 208 mg/dL — ABNORMAL HIGH (ref 70–99)
Potassium: 5 mEq/L (ref 3.5–5.1)
Sodium: 139 mEq/L (ref 135–145)
Total Bilirubin: 0.3 mg/dL (ref 0.3–1.2)
Total Protein: 7.9 g/dL (ref 6.0–8.3)

## 2011-06-20 NOTE — Patient Instructions (Addendum)
We will get records from Dr. Amedeo Plenty, [facility], for colonoscopy reports, pathology reports. You will be set up for a CT scan of abdomen and pelvis with IV and oral contrast for left sided abdominal pains. You will have labs checked today in the basement lab.  Please head down after you check out with the front desk  (cbc, cmet).     You have been scheduled for a CT scan of the abdomen and pelvis at [facility] ([address]---this is in the same building as Press photographer).   You are scheduled on 06/21/11 at 1 pm . You should arrive 15 minutes prior to your appointment time for registration. Please follow the written instructions below on the day of your exam:  WARNING: IF YOU ARE ALLERGIC TO IODINE/X-RAY DYE, PLEASE NOTIFY RADIOLOGY IMMEDIATELY AT [phone]! YOU WILL BE GIVEN A 13 HOUR PREMEDICATION PREP.  1) Do not eat or drink anything after 9 am  (4 hours prior to your test) 2) You have been given 2 bottles of oral contrast to drink. The solution may taste better if refrigerated, but do NOT add ice or any other liquid to this solution. Shake  well before drinking.    Drink 1 bottle of contrast @ 11 am  (2 hours prior to your exam)  Drink 1 bottle of contrast @ 12 pm  (1 hour prior to your exam)  You may take any medications as prescribed with a small amount of water except for the following: Metformin, Glucophage, Glucovance, Avandamet, Riomet, Fortamet, Actoplus Met, Janumet, Glumetza or Metaglip. The above medications must be held the day of the exam AND 48 hours after the exam.  The purpose of you drinking the oral contrast is to aid in the visualization of your intestinal tract. The contrast solution may cause some diarrhea. Before your exam is started, you will be given a small amount of fluid to drink. Depending on your individual set of symptoms, you may also receive an intravenous injection of x-ray contrast/dye. Plan on being at [facility] for 30 minutes  or long, depending on the type of exam you are having performed.  If you have any questions regarding your exam or if you need to reschedule, you may call the CT department at [phone] between the hours of 8:00 am and 5:00 pm, Monday-Friday.  ________________________________________________________________________

## 2011-06-20 NOTE — Progress Notes (Signed)
HPI: This is a   very pleasant 78 year old woman who is here with her husband today. I am meeting her for the first time  Had headache and abd pains at the same time.  She was given medicine for blood pressure medicine (benicar) and was told to come here for her abd pains.  Controlling the blood pressure helped her headaches.   She was told to take benicar.    She takes Aspirin.    She has belching whenever she eats, burping and the pains in peribump pains (left greater than right).  The pains in abd started about 3 weeks.  No nausea.  + anorexia.  The pain was present all the time, seemed worse after eating.  She has never had pains like this.  No fevers or chills.   Stopped meloxicam 2 weeks ago.   She is on tramadol, 81mg  asa.  darvocet is on her list but says she does not take it.    She has had colonoscopies:   [facility] Dr. Teena Irani has performed at least 2 of them.  She takes magnesium pill every day for chronic constipation.  But this past weekend diarrhrea.   The diarrhea has subsided though.  IMPRESSION from recent abd ultrasound:  Negative for gallstones. Fatty infiltration of the liver.   Review of systems: Pertinent positive and negative review of systems were noted in the above HPI section. Complete review of systems was performed and was otherwise normal.    Past Medical History  Diagnosis Date  . Liver lesion   . Chronic back pain   . Diabetes mellitus   . Hypercholesteremia   . Hypertension   . Neuropathy     Past Surgical History  Procedure Date  . Abdominal hysterectomy   . Cesarean section     x 3   . Back surgery     Current Outpatient Prescriptions  Medication Sig Dispense Refill  . aspirin 81 MG tablet Take 81 mg by mouth daily.      Marland Kitchen gabapentin (NEURONTIN) 300 MG capsule Take 300 mg by mouth 4 (four) times daily.      . Insulin Lispro, Human, (HUMALOG Craig) Inject 20 Units into the skin every evening.      . metFORMIN  (GLUCOPHAGE) 500 MG tablet Take 500 mg by mouth 2 (two) times daily with a meal.      . olmesartan (BENICAR) 20 MG tablet Take 20 mg by mouth daily.      . pioglitazone (ACTOS) 30 MG tablet Take 30 mg by mouth daily.      . propoxyphene-acetaminophen (DARVOCET A500) 100-500 MG per tablet Take 1 tablet by mouth as needed.        Allergies as of 06/20/2011 - Review Complete 06/20/2011  Allergen Reaction Noted  . Morphine and related Other (See Comments) 02/25/2011    Family History  Problem Relation Age of Onset  . Colon cancer Neg Hx   . Liver disease Sister   . Diabetes Sister     History   Social History  . Marital Status: Married    Spouse Name: N/A    Number of Children: 3  . Years of Education: N/A   Occupational History  . Retired    Social History Main Topics  . Smoking status: Never Smoker   . Smokeless tobacco: Never Used  . Alcohol Use: No  . Drug Use: No  . Sexually Active: Not on file   Other Topics Concern  .  Not on file   Social History Narrative   Daily caffeine        Physical Exam: BP 134/68  Pulse 60  Ht 5\' 3"  (1.6 m)  Wt 161 lb (73.029 kg)  BMI 28.52 kg/m2 Constitutional: generally well-appearing Psychiatric: alert and oriented x3 Eyes: extraocular movements intact Mouth: oral pharynx moist, no lesions Neck: supple no lymphadenopathy Cardiovascular: heart regular rate and rhythm Lungs: clear to auscultation bilaterally Abdomen: soft, minimal left-sided tenderness, nondistended, no obvious ascites, no peritoneal signs, normal bowel sounds, nicely healed midline incision from several previous surgeries Extremities: no lower extremity edema bilaterally Skin: no lesions on visible extremities    Assessment and plan: 78 y.o. female with  recent left-sided abdominal pain  We will get records sent over from her previous gastroenterologist, Dr. Amedeo Plenty. I am suspicious that she might have diverticulitis however she has no fevers and she is  only very minimally tender on the left side.  We will set up CT scan abdomen and pelvis with IV and oral contrast and lab tests including CBC and complete metabolic profile.

## 2011-06-21 ENCOUNTER — Ambulatory Visit (INDEPENDENT_AMBULATORY_CARE_PROVIDER_SITE_OTHER)
Admission: RE | Admit: 2011-06-21 | Discharge: 2011-06-21 | Disposition: A | Discharge status: Home / Self Care | Payer: Medicare Other | Source: Ambulatory Visit | Attending: Gastroenterology | Admitting: Gastroenterology

## 2011-06-21 DIAGNOSIS — R109 Unspecified abdominal pain: Secondary | ICD-10-CM

## 2011-06-21 MED ORDER — IOHEXOL 300 MG/ML  SOLN
80.0000 mL | Freq: Once | INTRAMUSCULAR | Status: AC | PRN
  Administered 2011-06-21: 80 mL via INTRAVENOUS

## 2011-07-07 ENCOUNTER — Telehealth: Payer: Self-pay | Admitting: Gastroenterology

## 2011-07-07 NOTE — Telephone Encounter (Signed)
Colonoscopy Dr. Amedeo Plenty, 01/2005, done for routine risk screening; "diverticulosis in sigmoid colon" no polyps or cancers. He recommended recall colonoscopy at 10 year interval which I agree with.   Chong Sicilian, she needs recall colonoscopy 01/2015.

## 2011-07-07 NOTE — Telephone Encounter (Signed)
Recall has been put in EPIC

## 2011-10-26 ENCOUNTER — Other Ambulatory Visit: Payer: Self-pay | Admitting: Dermatology

## 2011-11-16 ENCOUNTER — Emergency Department (HOSPITAL_COMMUNITY)
Admission: EM | Admit: 2011-11-16 | Discharge: 2011-11-16 | Disposition: A | Discharge status: Home / Self Care | Payer: Medicare Other | Attending: Emergency Medicine | Admitting: Emergency Medicine

## 2011-11-16 ENCOUNTER — Emergency Department (HOSPITAL_COMMUNITY): Payer: Medicare Other

## 2011-11-16 ENCOUNTER — Encounter (HOSPITAL_COMMUNITY): Payer: Self-pay | Admitting: *Deleted

## 2011-11-16 DIAGNOSIS — R079 Chest pain, unspecified: Secondary | ICD-10-CM | POA: Insufficient documentation

## 2011-11-16 LAB — COMPREHENSIVE METABOLIC PANEL
ALT: 13 U/L (ref 0–35)
AST: 19 U/L (ref 0–37)
Albumin: 3.8 g/dL (ref 3.5–5.2)
Alkaline Phosphatase: 45 U/L (ref 39–117)
BUN: 29 mg/dL — ABNORMAL HIGH (ref 6–23)
CO2: 25 mEq/L (ref 19–32)
Calcium: 9.7 mg/dL (ref 8.4–10.5)
Chloride: 106 mEq/L (ref 96–112)
Creatinine, Ser: 1.29 mg/dL — ABNORMAL HIGH (ref 0.50–1.10)
GFR calc Af Amer: 44 mL/min — ABNORMAL LOW (ref >90)
GFR calc non Af Amer: 38 mL/min — ABNORMAL LOW (ref >90)
Glucose, Bld: 155 mg/dL — ABNORMAL HIGH (ref 70–99)
Potassium: 4 mEq/L (ref 3.5–5.1)
Sodium: 142 mEq/L (ref 135–145)
Total Bilirubin: 0.1 mg/dL — ABNORMAL LOW (ref 0.3–1.2)
Total Protein: 7 g/dL (ref 6.0–8.3)

## 2011-11-16 LAB — TROPONIN I: Troponin I: <0.3 ng/mL (ref <0.30)

## 2011-11-16 LAB — CBC
HCT: 31.9 % — ABNORMAL LOW (ref 36.0–46.0)
Hemoglobin: 11 g/dL — ABNORMAL LOW (ref 12.0–15.0)
MCH: 31.7 pg (ref 26.0–34.0)
MCHC: 34.5 g/dL (ref 30.0–36.0)
MCV: 91.9 fL (ref 78.0–100.0)
Platelets: 279 10*3/uL (ref 150–400)
RBC: 3.47 MIL/uL — ABNORMAL LOW (ref 3.87–5.11)
RDW: 12.6 % (ref 11.5–15.5)
WBC: 9.8 10*3/uL (ref 4.0–10.5)

## 2011-11-16 LAB — POCT I-STAT TROPONIN I
Troponin i, poc: 0 ng/mL (ref 0.00–0.08)
Troponin i, poc: 0.01 ng/mL (ref 0.00–0.08)

## 2011-11-16 LAB — CK TOTAL AND CKMB (NOT AT ARMC)
CK, MB: 2.8 ng/mL (ref 0.3–4.0)
Relative Index: 2.5 (ref 0.0–2.5)
Total CK: 111 U/L (ref 7–177)

## 2011-11-16 LAB — LIPASE, BLOOD: Lipase: 24 U/L (ref 11–59)

## 2011-11-16 LAB — PROTIME-INR
INR: 1.04 (ref 0.00–1.49)
Prothrombin Time: 13.8 seconds (ref 11.6–15.2)

## 2011-11-16 LAB — APTT: aPTT: 31 seconds (ref 24–37)

## 2011-11-16 LAB — GLUCOSE, CAPILLARY: Glucose-Capillary: 136 mg/dL — ABNORMAL HIGH (ref 70–99)

## 2011-11-16 MED ORDER — HYDROCODONE-ACETAMINOPHEN 5-325 MG PO TABS: 1.0000 | ORAL_TABLET | ORAL | Status: AC | PRN

## 2011-11-16 MED ORDER — HYDROCODONE-ACETAMINOPHEN 5-325 MG PO TABS
2.0000 | ORAL_TABLET | Freq: Once | ORAL | Status: AC
  Administered 2011-11-16: 1 via ORAL
  Filled 2011-11-16: qty 2

## 2011-11-16 MED ORDER — FAMOTIDINE IN NACL 20-0.9 MG/50ML-% IV SOLN
20.0000 mg | Freq: Once | INTRAVENOUS | Status: AC
  Administered 2011-11-16: 20 mg via INTRAVENOUS
  Filled 2011-11-16: qty 50

## 2011-11-16 NOTE — ED Notes (Signed)
Received report and care of patient from [place]. Patient settled and oriented to room. She states there is no change in her pain. Underneath her breastbone all the way across her chest.... Steady, hard pain... Level about 6.

## 2011-11-16 NOTE — ED Notes (Signed)
Care transferred and report given to Melody, RN.

## 2011-11-16 NOTE — ED Provider Notes (Addendum)
History     CSN: [id number]  Arrival date & time 11/16/11  0458   First MD Initiated Contact with Patient 11/16/11 0459      Chief Complaint  Patient presents with  . Chest Pain    (Consider location/radiation/quality/duration/timing/severity/associated sxs/prior treatment) Patient is a 78 y.o. female presenting with chest pain. The history is provided by the patient.  Chest Pain The chest pain began 6 - 12 hours ago. Duration of episode(s) is 5 hours. Chest pain occurs constantly. The chest pain is worsening. The pain is associated with eating. At its most intense, the pain is at 7/10. The pain is currently at 2/10. The severity of the pain is mild. The quality of the pain is described as burning and aching. The pain radiates to the epigastrium. Primary symptoms include nausea. Pertinent negatives for primary symptoms include no cough, no wheezing, no palpitations and no abdominal pain. She tried nitroglycerin and aspirin for the symptoms.  Her past medical history is significant for diabetes and hypertension.  Her family medical history is significant for connective tissue disease in family.     Past Medical History  Diagnosis Date  . Liver lesion   . Chronic back pain   . Diabetes mellitus   . Hypercholesteremia   . Hypertension   . Neuropathy   . Arthritis     Past Surgical History  Procedure Date  . Abdominal hysterectomy   . Cesarean section     x 3   . Back surgery     Family History  Problem Relation Age of Onset  . Colon cancer Neg Hx   . Liver disease Sister   . Diabetes Sister     History  Substance Use Topics  . Smoking status: Never Smoker   . Smokeless tobacco: Never Used  . Alcohol Use: No    OB History    Grav Para Term Preterm Abortions TAB SAB Ect Mult Living                  Review of Systems  Respiratory: Negative for cough and wheezing.   Cardiovascular: Positive for chest pain. Negative for palpitations.  Gastrointestinal:  Positive for nausea. Negative for abdominal pain.  All other systems reviewed and are negative.    Allergies  Morphine and related  Home Medications   Current Outpatient Rx  Name Route Sig Dispense Refill  . ASPIRIN 81 MG PO TABS Oral Take 81 mg by mouth daily.    Marland Kitchen VITAMIN D3 2000 UNITS PO TABS Oral Take 1 capsule by mouth 2 (two) times daily.    . FENOFIBRATE MICRONIZED 134 MG PO CAPS Oral Take 134 mg by mouth daily.    Marland Kitchen GABAPENTIN 300 MG PO CAPS Oral Take 300 mg by mouth 2 (two) times daily.     Marland Kitchen HYDROCHLOROTHIAZIDE 12.5 MG PO CAPS Oral Take 12.5 mg by mouth daily.    . INSULIN GLARGINE 100 UNIT/ML [place] SOLN Subcutaneous Inject 40 Units into the skin daily.    . INSULIN LISPRO (HUMAN) 100 UNIT/ML [place] SOLN Subcutaneous Inject 10 Units into the skin 3 (three) times daily before meals.    Marland Kitchen METFORMIN HCL 500 MG PO TABS Oral Take 500 mg by mouth 2 (two) times daily with a meal.    . OCUVITE PO Oral Take by mouth as directed.    Marland Kitchen TRAMADOL HCL 50 MG PO TABS Oral Take 50 mg by mouth as directed.    Marland Kitchen VITAMIN B-12 1000 MCG  PO TABS Oral Take 1,000 mcg by mouth 2 (two) times daily.      BP 161/49  Pulse 60  Temp 98 F (36.7 C) (Oral)  Resp 16  SpO2 97%  Physical Exam  Constitutional: She is oriented to person, place, and time. She appears well-developed and well-nourished.  HENT:  Head: Normocephalic and atraumatic.  Eyes: Conjunctivae and EOM are normal. Pupils are equal, round, and reactive to light.  Neck: Normal range of motion.  Cardiovascular: Normal rate, regular rhythm and normal heart sounds.   Pulmonary/Chest: Effort normal and breath sounds normal.  Abdominal: Soft. Bowel sounds are normal.  Musculoskeletal: Normal range of motion.  Neurological: She is alert and oriented to person, place, and time.  Skin: Skin is warm and dry.  Psychiatric: She has a normal mood and affect. Her behavior is normal.    ED Course  Procedures (including critical care time)   Labs  Reviewed  CBC  COMPREHENSIVE METABOLIC PANEL  APTT  PROTIME-INR  TROPONIN I  CK TOTAL AND CKMB   No results found.   No diagnosis found.   Date: 11/16/2011  Rate: 60  Rhythm: normal sinus rhythm  QRS Axis: normal  Intervals: normal  ST/T Wave abnormalities: normal  Conduction Disutrbances: none  Narrative Interpretation: unremarkable     MDM  + epigastric chest pain.  Improved with nitro.  Labs benign x 1 .  + risk factors for cad.  appropriate for cdu.  Will obs, and reassess        Aaron Edelman, MD 12/05/11 2303  Aaron Edelman, MD 01/18/12 Lurena Nida

## 2011-11-16 NOTE — ED Notes (Signed)
Per EMS - pt from home, pt states pain began approx 10pm yesterday evening, progressively worse. Pain is non-radiating, steady/constant pain, epigastric pain. Worse w/ position change - EMS w/ EKG showing heart block - pt denies any cardiac hx. Pt w/ cbg of 147. Pt denies shortness of breath or diaphoresis - some intermittent nausea. Pain decreased s/p x4 0.4mg  SL nitro - pt also given 324mg  ASA.

## 2011-11-16 NOTE — ED Provider Notes (Signed)
Medical screening examination/treatment/procedure(s) were conducted as a shared visit with non-physician practitioner(s) and myself.  I personally evaluated the patient during the encounter  [name] game plan was sorted out by myself and the mid-level wasn't clear at turnover with the expectation was. However the patient and having the chest pain for more than 10 hours so a third troponin was ordered and that was negative patient was be discharged home with a cardiology followup and followup with her primary care Dr. based on 12 hours worth of chest pain without any change in the troponin it is unlikely that it was cardiac event. Patient has had persistent chest pain throughout the ED stay.  Mervin Kung, MD 11/16/11 [phone]

## 2011-11-16 NOTE — ED Notes (Signed)
Pt reports left lower chest pain that radiates up into her chest that began yesterday evening after dinner, pt denies shortness of breath or nausea, skin warm and dry. Pt denies any cardiac or GI hx. Pt describes pain as constant discomfort.

## 2011-11-16 NOTE — ED Notes (Signed)
Patient requested one Vicodin instead of two. Believes one will be enough. PA agreed.

## 2011-11-16 NOTE — ED Provider Notes (Signed)
9:15 AM Assumed care of patient in the CDU from Dr. Wetzel Bjornstad.  Patient began having chest pain around 10 PM last evening.  Pain located just inferior to both breasts.  She describes the pain as a pressure and reports that it does not radiate.  Pain has been constant, but is improving.  She has never had pain like this before.  Pain not associated with nausea, vomiting, diaphoresis, numbness, tingling, dizziness, syncope, or SOB.  She was brought in by EMS and was given Aspirin and NG x 3.  She reports that the NG helped with the pain somewhat.  PMH significant for HTN, hyperlipidemia, and DM.  Patient denies prior cardiac history.  No acute changes on EKG.  Troponin negative x 2.  Reassessed patient.  She reports that she is still having chest pain at this time.  She continues to deny any associated symptoms.  Patient in no acute distress.  VSS, Heart RRR, Lungs CTAB, chest wall tender to palpation just inferior to both breasts at the area where she is having pain, epigastrium tender to palpation, no LE edema, DP pulse 2+ bilaterally.  Discussed plan with Dr. Rogene Houston.  He is recommending giving the patient pain medication, CXR, getting another troponin, and having the patient follow up with Cardiology outpatient.  10:09 AM Discussed plan with patient.  She is in agreement with the plan.  Patient now reports that she was wearing a bra all day yesterday that was very tight.  She is wondering if this contributed to the pain.  Pain located in the area that the underwire of a bra would have been.  Third troponin negative, which was approximately 12 hours after the initial onset of pain.  No acute findings on EKG.  CXR negative. Therefore, feel that patient can be discharged home.  Patient instructed to follow up with PCP and Cardiology.  Patient in agreement with plan.    Sherlyn Lees Linden, PA-C 11/16/11 1256

## 2011-12-23 ENCOUNTER — Other Ambulatory Visit: Payer: Self-pay | Admitting: Orthopaedic Surgery

## 2012-01-05 ENCOUNTER — Encounter (HOSPITAL_BASED_OUTPATIENT_CLINIC_OR_DEPARTMENT_OTHER): Payer: Self-pay | Admitting: *Deleted

## 2012-01-05 NOTE — Progress Notes (Signed)
Pt diabetic-seeing pcp 9/13-will see if they can do lab Told to have a late even snack-clear liq until 6am dos-do not take any insulin am of surgery

## 2012-01-06 NOTE — H&P (Signed)
[name] is an 78 y.o. female.   Chief Complaint: right ring finger trigger HPI:[name] is complaining of a right ring trigger finger for the last number of months almost a year.  She has had multiple injections done in our office that helped only temporarily and she is now requesting surgery to take care of Kelsey Dennisthe most recent injection was done December 21, 2011.  We have discussed proceeding with a right ring finger trigger release to take care of [name].  Past Medical History  Diagnosis Date  . Liver lesion   . Chronic back pain   . Diabetes mellitus   . Hypercholesteremia   . Hypertension   . Neuropathy   . Arthritis   . Diabetic neuropathy   . Wears glasses     Past Surgical History  Procedure Date  . Abdominal hysterectomy   . Cesarean section     x 3   . Back surgery 1993    lumb lam-fusion  . Appendectomy   . Colonoscopy   . Eye surgery     both cataracts    Family History  Dennis Relation Age of Onset  . Colon cancer Neg Hx   . Liver disease Sister   . Diabetes Sister    Social History:  reports that she has never smoked. She has never used smokeless tobacco. She reports that she does not drink alcohol or use illicit drugs.  Allergies:  Allergies  Allergen Reactions  . Morphine And Related Other (See Comments)    fuzzy feeling    No prescriptions prior to admission    No results found for this or any previous visit (from the past 48 hour(s)). No results found.  Review of Systems  Constitutional: Negative.   HENT: Negative.   Eyes: Negative.   Respiratory: Negative.   Cardiovascular: Negative.   Gastrointestinal: Negative.   Genitourinary: Negative.   Musculoskeletal: Negative.   Skin: Negative.   Neurological: Negative.   Endo/Heme/Allergies: Negative.   Psychiatric/Behavioral: Negative.     Height 5\' 3"  (1.6 m), weight 68.04 kg (150 lb). Physical Exam  Constitutional: She appears well-nourished.  HENT:    Head: Atraumatic.  Eyes: EOM are normal.  Neck: Normal range of motion.  Cardiovascular: Regular rhythm.   Respiratory: Breath sounds normal.  GI: Bowel sounds are normal.  Musculoskeletal:       Right hand: Right ring finger catches in the palm on a regular basis.  She also has pain over the flexor tendon sheath to direct palpation.  Good neurovascular status and good capillary refill.  Neurological: She is alert.  Skin: Skin is warm.  Psychiatric: She has a normal mood and affect.     Assessment/Plan Assessment: Right ring trigger finger injected multiple times continuing to trigger. Plan: [name] needs a trigger finger release.  We have reviewed the risks of anesthesia and infection and neurovascular injury.  We have also discussed her recovery and the potential need for physical therapy postoperatively.  [name] R 01/06/2012, 12:45 PM

## 2012-01-10 ENCOUNTER — Encounter (HOSPITAL_BASED_OUTPATIENT_CLINIC_OR_DEPARTMENT_OTHER): Payer: Self-pay | Admitting: *Deleted

## 2012-01-10 ENCOUNTER — Ambulatory Visit (HOSPITAL_BASED_OUTPATIENT_CLINIC_OR_DEPARTMENT_OTHER)
Admission: RE | Admit: 2012-01-10 | Discharge: 2012-01-10 | Disposition: A | Discharge status: Home / Self Care | Payer: Medicare Other | Source: Ambulatory Visit | Attending: Orthopaedic Surgery | Admitting: Orthopaedic Surgery

## 2012-01-10 ENCOUNTER — Encounter (HOSPITAL_BASED_OUTPATIENT_CLINIC_OR_DEPARTMENT_OTHER)
Admission: RE | Disposition: A | Discharge status: Home / Self Care | Payer: Self-pay | Source: Ambulatory Visit | Attending: Orthopaedic Surgery

## 2012-01-10 ENCOUNTER — Ambulatory Visit (HOSPITAL_BASED_OUTPATIENT_CLINIC_OR_DEPARTMENT_OTHER): Payer: Medicare Other | Admitting: *Deleted

## 2012-01-10 DIAGNOSIS — Z794 Long term (current) use of insulin: Secondary | ICD-10-CM | POA: Insufficient documentation

## 2012-01-10 DIAGNOSIS — E1149 Type 2 diabetes mellitus with other diabetic neurological complication: Secondary | ICD-10-CM | POA: Insufficient documentation

## 2012-01-10 DIAGNOSIS — M6530 Trigger finger, unspecified finger: Secondary | ICD-10-CM

## 2012-01-10 DIAGNOSIS — I10 Essential (primary) hypertension: Secondary | ICD-10-CM | POA: Insufficient documentation

## 2012-01-10 DIAGNOSIS — E1142 Type 2 diabetes mellitus with diabetic polyneuropathy: Secondary | ICD-10-CM | POA: Insufficient documentation

## 2012-01-10 HISTORY — DX: Type 2 diabetes mellitus with diabetic neuropathy, unspecified: E11.40

## 2012-01-10 HISTORY — PX: TRIGGER FINGER RELEASE: SHX641

## 2012-01-10 HISTORY — DX: Presence of spectacles and contact lenses: Z97.3

## 2012-01-10 LAB — POCT HEMOGLOBIN-HEMACUE: Hemoglobin: 10.8 g/dL — ABNORMAL LOW (ref 12.0–15.0)

## 2012-01-10 LAB — GLUCOSE, CAPILLARY: Glucose-Capillary: 65 mg/dL — ABNORMAL LOW (ref 70–99)

## 2012-01-10 SURGERY — RELEASE, A1 PULLEY, FOR TRIGGER FINGER
Anesthesia: Regional | Site: Finger | Laterality: Right | Wound class: Clean

## 2012-01-10 MED ORDER — FENTANYL CITRATE 0.05 MG/ML IJ SOLN
INTRAMUSCULAR | Status: DC | PRN
  Administered 2012-01-10: 1 ug via INTRAVENOUS

## 2012-01-10 MED ORDER — MIDAZOLAM HCL 5 MG/5ML IJ SOLN
INTRAMUSCULAR | Status: DC | PRN
  Administered 2012-01-10: 0.5 mg via INTRAVENOUS

## 2012-01-10 MED ORDER — OXYCODONE HCL 5 MG/5ML PO SOLN: 5.0000 mg | Freq: Once | ORAL | Status: DC | PRN

## 2012-01-10 MED ORDER — LACTATED RINGERS IV SOLN
INTRAVENOUS | Status: DC
  Administered 2012-01-10: 10:00:00 via INTRAVENOUS

## 2012-01-10 MED ORDER — BUPIVACAINE HCL (PF) 0.5 % IJ SOLN
INTRAMUSCULAR | Status: DC | PRN
  Administered 2012-01-10: 4 mL

## 2012-01-10 MED ORDER — PROPOFOL 10 MG/ML IV BOLUS
INTRAVENOUS | Status: DC | PRN
  Administered 2012-01-10: 200 mg via INTRAVENOUS

## 2012-01-10 MED ORDER — HYDROCODONE-ACETAMINOPHEN 5-325 MG PO TABS: 1.0000 | ORAL_TABLET | Freq: Four times a day (QID) | ORAL | Status: DC | PRN

## 2012-01-10 MED ORDER — CEFAZOLIN SODIUM-DEXTROSE 2-3 GM-% IV SOLR
2.0000 g | INTRAVENOUS | Status: AC
  Administered 2012-01-10: 2 g via INTRAVENOUS

## 2012-01-10 MED ORDER — HYDROMORPHONE HCL PF 1 MG/ML IJ SOLN: 0.2500 mg | INTRAMUSCULAR | Status: DC | PRN

## 2012-01-10 MED ORDER — ONDANSETRON HCL 4 MG/2ML IJ SOLN: 4.0000 mg | Freq: Once | INTRAMUSCULAR | Status: DC | PRN

## 2012-01-10 MED ORDER — ONDANSETRON HCL 4 MG/2ML IJ SOLN
INTRAMUSCULAR | Status: DC | PRN
  Administered 2012-01-10: 4 mg via INTRAVENOUS

## 2012-01-10 MED ORDER — LIDOCAINE HCL (CARDIAC) 20 MG/ML IV SOLN
INTRAVENOUS | Status: DC | PRN
  Administered 2012-01-10: 25 mg via INTRAVENOUS

## 2012-01-10 MED ORDER — OXYCODONE HCL 5 MG PO TABS: 5.0000 mg | ORAL_TABLET | Freq: Once | ORAL | Status: DC | PRN

## 2012-01-10 MED ORDER — CHLORHEXIDINE GLUCONATE 4 % EX LIQD: 60.0000 mL | Freq: Once | CUTANEOUS | Status: DC

## 2012-01-10 SURGICAL SUPPLY — 46 items
BANDAGE ELASTIC 3 VELCRO ST LF (GAUZE/BANDAGES/DRESSINGS) ×1 IMPLANT
BANDAGE ELASTIC 4 VELCRO ST LF (GAUZE/BANDAGES/DRESSINGS) ×1 IMPLANT
BANDAGE GAUZE ELAST BULKY 4 IN (GAUZE/BANDAGES/DRESSINGS) ×2 IMPLANT
BLADE MINI RND TIP GREEN BEAV (BLADE) ×2 IMPLANT
BLADE SURG 15 STRL LF DISP TIS (BLADE) ×1 IMPLANT
BLADE SURG 15 STRL SS (BLADE) ×2
BNDG CMPR 9X4 STRL LF SNTH (GAUZE/BANDAGES/DRESSINGS) ×1
BNDG ESMARK 4X9 LF (GAUZE/BANDAGES/DRESSINGS) ×1 IMPLANT
COVER MAYO STAND STRL (DRAPES) ×2 IMPLANT
COVER TABLE BACK 60X90 (DRAPES) ×2 IMPLANT
CUFF TOURNIQUET SINGLE 18IN (TOURNIQUET CUFF) ×1 IMPLANT
DRAPE EXTREMITY T 121X128X90 (DRAPE) ×2 IMPLANT
DRAPE U-SHAPE 47X51 STRL (DRAPES) IMPLANT
DRSG EMULSION OIL 3X3 NADH (GAUZE/BANDAGES/DRESSINGS) ×2 IMPLANT
DURAPREP 26ML APPLICATOR (WOUND CARE) ×2 IMPLANT
ELECT NDL TIP 2.8 STRL (NEEDLE) IMPLANT
ELECT NEEDLE TIP 2.8 STRL (NEEDLE) ×2 IMPLANT
ELECT REM PT RETURN 9FT ADLT (ELECTROSURGICAL) ×2
ELECTRODE REM PT RTRN 9FT ADLT (ELECTROSURGICAL) ×1 IMPLANT
GLOVE BIO SURGEON STRL SZ 6.5 (GLOVE) ×1 IMPLANT
GLOVE BIO SURGEON STRL SZ8.5 (GLOVE) ×2 IMPLANT
GLOVE BIOGEL PI IND STRL 8 (GLOVE) ×1 IMPLANT
GLOVE BIOGEL PI IND STRL 8.5 (GLOVE) ×1 IMPLANT
GLOVE BIOGEL PI INDICATOR 8 (GLOVE) ×1
GLOVE BIOGEL PI INDICATOR 8.5 (GLOVE) ×1
GLOVE SS BIOGEL STRL SZ 8 (GLOVE) ×1 IMPLANT
GLOVE SUPERSENSE BIOGEL SZ 8 (GLOVE) ×1
GOWN PREVENTION PLUS XLARGE (GOWN DISPOSABLE) ×2 IMPLANT
GOWN STRL REIN XL XLG (GOWN DISPOSABLE) ×4 IMPLANT
LOOP VESSEL MAXI BLUE (MISCELLANEOUS) IMPLANT
NDL HYPO 25X1 1.5 SAFETY (NEEDLE) IMPLANT
NEEDLE HYPO 25X1 1.5 SAFETY (NEEDLE) ×2 IMPLANT
NS IRRIG 1000ML POUR BTL (IV SOLUTION) ×2 IMPLANT
PACK BASIN DAY SURGERY FS (CUSTOM PROCEDURE TRAY) ×2 IMPLANT
PENCIL BUTTON HOLSTER BLD 10FT (ELECTRODE) ×2 IMPLANT
SPONGE GAUZE 4X4 12PLY (GAUZE/BANDAGES/DRESSINGS) ×2 IMPLANT
STOCKINETTE 4X48 STRL (DRAPES) ×2 IMPLANT
SUT ETHILON 4 0 PS 2 18 (SUTURE) ×2 IMPLANT
SUT VIC AB 4-0 P-3 18XBRD (SUTURE) IMPLANT
SUT VIC AB 4-0 P3 18 (SUTURE)
SYR BULB 3OZ (MISCELLANEOUS) ×1 IMPLANT
SYR CONTROL 10ML LL (SYRINGE) ×1 IMPLANT
TOWEL OR 17X24 6PK STRL BLUE (TOWEL DISPOSABLE) ×2 IMPLANT
TOWEL OR NON WOVEN STRL DISP B (DISPOSABLE) ×2 IMPLANT
UNDERPAD 30X30 INCONTINENT (UNDERPADS AND DIAPERS) ×2 IMPLANT
WATER STERILE IRR 1000ML POUR (IV SOLUTION) ×2 IMPLANT

## 2012-01-10 NOTE — Transfer of Care (Signed)
Immediate Anesthesia Transfer of Care Note  Patient: [name]  Procedure(s) Performed: Procedure(s) (LRB) with comments: RELEASE TRIGGER FINGER/A-1 PULLEY (Right) - right ring trigger release  Patient Location: PACU  Anesthesia Type: General  Level of Consciousness: awake and sedated, cooperative  Airway & Oxygen Therapy: Patient Spontanous Breathing and Patient connected to face mask oxygen  Post-op Assessment: Report given to PACU RN, Post -op Vital signs reviewed and stable and Patient moving all extremities  Post vital signs: Reviewed and stable  Complications: No apparent anesthesia complications

## 2012-01-10 NOTE — Anesthesia Preprocedure Evaluation (Signed)
Anesthesia Evaluation  Patient identified by MRN, date of birth, ID band Patient awake    Airway Mallampati: I TM Distance: >3 FB Neck ROM: Full    Dental  (+) Teeth Intact and Dental Advisory Given   Pulmonary  breath sounds clear to auscultation        Cardiovascular hypertension, Pt. on medications Rhythm:Regular Rate:Normal     Neuro/Psych    GI/Hepatic   Endo/Other  diabetes, Well Controlled, Type 2, Insulin Dependent  Renal/GU      Musculoskeletal   Abdominal   Peds  Hematology   Anesthesia Other Findings   Reproductive/Obstetrics                           Anesthesia Physical Anesthesia Plan  ASA: III  Anesthesia Plan: Regional   Post-op Pain Management:    Induction: Intravenous  Airway Management Planned:   Additional Equipment:   Intra-op Plan:   Post-operative Plan:   Informed Consent: I have reviewed the patients History and Physical, chart, labs and discussed the procedure including the risks, benefits and alternatives for the proposed anesthesia with the patient or authorized representative who has indicated his/her understanding and acceptance.   Dental advisory given  Plan Discussed with: CRNA, Anesthesiologist and Surgeon  Anesthesia Plan Comments:         Anesthesia Quick Evaluation

## 2012-01-10 NOTE — Interval H&P Note (Signed)
History and Physical Interval Note:  01/10/2012 9:33 AM  [name]  has presented today for surgery, with the diagnosis of right ring trigger finger  The various methods of treatment have been discussed with the patient and family. After consideration of risks, benefits and other options for treatment, the patient has consented to  Procedure(s) (LRB) with comments: RELEASE TRIGGER FINGER/A-1 PULLEY (Right) - right ring trigger release as a surgical intervention .  The patient's history has been reviewed, patient examined, no change in status, stable for surgery.  I have reviewed the patient's chart and labs.  Questions were answered to the patient's satisfaction.     Camylle Whicker G

## 2012-01-10 NOTE — Anesthesia Procedure Notes (Signed)
Procedure Name: LMA Insertion Date/Time: 01/10/2012 10:10 AM Performed by: [place]ney [place] Pre-anesthesia Checklist: Patient identified, Emergency Drugs available, Suction available and Patient being monitored Patient Re-evaluated:Patient Re-evaluated prior to inductionOxygen Delivery Method: Circle System Utilized Preoxygenation: Pre-oxygenation with 100% oxygen Intubation Type: IV induction Ventilation: Mask ventilation without difficulty LMA: LMA inserted LMA Size: 4.0 Number of attempts: 1 Airway Equipment and Method: bite block Placement Confirmation: positive ETCO2 and breath sounds checked- equal and bilateral Tube secured with: Tape Dental Injury: Teeth and Oropharynx as per pre-operative assessment

## 2012-01-10 NOTE — Op Note (Signed)
#  [id number] 

## 2012-01-10 NOTE — Anesthesia Postprocedure Evaluation (Signed)
  Anesthesia Post-op Note  Patient: [name]  Procedure(s) Performed: Procedure(s) (LRB) with comments: RELEASE TRIGGER FINGER/A-1 PULLEY (Right) - right ring trigger release  Patient Location: PACU  Anesthesia Type: General  Level of Consciousness: awake, alert  and oriented  Airway and Oxygen Therapy: Patient Spontanous Breathing  Post-op Pain: none  Post-op Assessment: Post-op Vital signs reviewed  Post-op Vital Signs: Reviewed  Complications: No apparent anesthesia complications

## 2012-01-11 ENCOUNTER — Encounter (HOSPITAL_BASED_OUTPATIENT_CLINIC_OR_DEPARTMENT_OTHER): Payer: Self-pay | Admitting: Orthopaedic Surgery

## 2012-01-11 LAB — GLUCOSE, CAPILLARY: Glucose-Capillary: 80 mg/dL (ref 70–99)

## 2012-01-11 NOTE — Op Note (Signed)
NAMEAVANELL, CAJINA                ACCOUNT NO.:  [id number]  MEDICAL RECORD NO.:  [id number]  LOCATION:                                 FACILITY:  PHYSICIAN:  Monico Blitz. Evone Arseneau, M.D.DATE OF BIRTH:  1929/12/31  DATE OF PROCEDURE:  01/10/2012 DATE OF DISCHARGE:                              OPERATIVE REPORT   PREOPERATIVE DIAGNOSIS:  Right ring trigger finger.  POSTOPERATIVE DIAGNOSIS:  Right ring trigger finger.  PROCEDURE:  Right ring trigger finger release.  ANESTHESIA:  General.  ATTENDING SURGEON:  Monico Blitz. Rhona Raider, MD  ASSISTANT:  Roselee Nova, PA  INDICATION FOR PROCEDURE:  The patient is a 78 year old woman with a long history of a triggering right ring finger.  We have injected her on at least 3 occasions with transient relief with the first two.  She has dysfunction of the hand related to this triggering and she is offered a release.  Informed operative consent was obtained after discussion of possible complications including reaction to anesthesia, infection, and neurovascular injury.  SUMMARY OF FINDINGS AND PROCEDURE:  Under general anesthesia, through a small palmar incision, a very thick A1 pulley was released at the base of the right ring finger.  She was closed primarily and discharged home.  DESCRIPTION OF PROCEDURE:  The patient was taken to the operating suite where an attempt of a Bier block was made.  Portion of this was not successful and a general anesthetic ensued.  She was then positioned supine and prepped and draped in normal sterile fashion.  After the administration of IV Kefzol and an appropriate time-out, the right arm was elevated, exsanguinated, and a tourniquet inflated about the forearm.  A small palmar incision was made along the distal palmar flexion crease.  Dissection was carried down to the flexor tendon sheath for the ring finger.  This was then incised longitudinally sharply.  The A1 pulley was completely released.   Subsequently I could pull the tendon out of the wound.  The wound was then irrigated followed by reapproximation of skin with nylon.  The tourniquet was deflated and the hand became pink and warm immediately.  Adaptic was applied after some local anesthetic was instilled around the incision.  We then placed dry gauze dressing and loose Ace wrap.  Estimated blood loss and fluids were obtained from anesthesia records as well as accurate tourniquet time.  DISPOSITION:  The patient was extubated in the operating room and taken to recovery in stable condition.  She was to go home same day and follow up in the office closely.  I will contact her by phone tonight.     Monico Blitz Rhona Raider, M.D.     PGD/MEDQ  D:  01/10/2012  T:  01/11/2012  Job:  [id number]

## 2012-02-06 ENCOUNTER — Other Ambulatory Visit: Payer: Self-pay | Admitting: Internal Medicine

## 2012-02-06 DIAGNOSIS — Z1231 Encounter for screening mammogram for malignant neoplasm of breast: Secondary | ICD-10-CM

## 2012-03-05 ENCOUNTER — Ambulatory Visit
Admission: RE | Admit: 2012-03-05 | Discharge: 2012-03-05 | Disposition: A | Discharge status: Home / Self Care | Payer: Medicare Other | Source: Ambulatory Visit | Attending: Internal Medicine | Admitting: Internal Medicine

## 2012-03-05 DIAGNOSIS — Z1231 Encounter for screening mammogram for malignant neoplasm of breast: Secondary | ICD-10-CM

## 2012-07-29 ENCOUNTER — Emergency Department (HOSPITAL_COMMUNITY): Payer: Medicare Other

## 2012-07-29 ENCOUNTER — Other Ambulatory Visit (HOSPITAL_COMMUNITY): Payer: Medicare Other

## 2012-07-29 ENCOUNTER — Observation Stay (HOSPITAL_COMMUNITY)
Admission: EM | Admit: 2012-07-29 | Discharge: 2012-07-30 | Disposition: A | Discharge status: Home / Self Care | Payer: Medicare Other | Attending: Internal Medicine | Admitting: Internal Medicine

## 2012-07-29 ENCOUNTER — Observation Stay (HOSPITAL_COMMUNITY): Payer: Medicare Other

## 2012-07-29 ENCOUNTER — Encounter (HOSPITAL_COMMUNITY): Payer: Self-pay | Admitting: Emergency Medicine

## 2012-07-29 DIAGNOSIS — Z794 Long term (current) use of insulin: Secondary | ICD-10-CM | POA: Insufficient documentation

## 2012-07-29 DIAGNOSIS — R4182 Altered mental status, unspecified: Secondary | ICD-10-CM | POA: Insufficient documentation

## 2012-07-29 DIAGNOSIS — R001 Bradycardia, unspecified: Secondary | ICD-10-CM

## 2012-07-29 DIAGNOSIS — N189 Chronic kidney disease, unspecified: Secondary | ICD-10-CM | POA: Insufficient documentation

## 2012-07-29 DIAGNOSIS — N179 Acute kidney failure, unspecified: Secondary | ICD-10-CM | POA: Insufficient documentation

## 2012-07-29 DIAGNOSIS — I129 Hypertensive chronic kidney disease with stage 1 through stage 4 chronic kidney disease, or unspecified chronic kidney disease: Secondary | ICD-10-CM | POA: Insufficient documentation

## 2012-07-29 DIAGNOSIS — E119 Type 2 diabetes mellitus without complications: Secondary | ICD-10-CM

## 2012-07-29 DIAGNOSIS — E1149 Type 2 diabetes mellitus with other diabetic neurological complication: Secondary | ICD-10-CM | POA: Insufficient documentation

## 2012-07-29 DIAGNOSIS — G8929 Other chronic pain: Secondary | ICD-10-CM | POA: Diagnosis present

## 2012-07-29 DIAGNOSIS — I498 Other specified cardiac arrhythmias: Secondary | ICD-10-CM | POA: Insufficient documentation

## 2012-07-29 DIAGNOSIS — G9340 Encephalopathy, unspecified: Principal | ICD-10-CM | POA: Insufficient documentation

## 2012-07-29 DIAGNOSIS — M549 Dorsalgia, unspecified: Secondary | ICD-10-CM | POA: Insufficient documentation

## 2012-07-29 DIAGNOSIS — I10 Essential (primary) hypertension: Secondary | ICD-10-CM | POA: Diagnosis present

## 2012-07-29 DIAGNOSIS — D72829 Elevated white blood cell count, unspecified: Secondary | ICD-10-CM | POA: Insufficient documentation

## 2012-07-29 DIAGNOSIS — E1142 Type 2 diabetes mellitus with diabetic polyneuropathy: Secondary | ICD-10-CM | POA: Insufficient documentation

## 2012-07-29 DIAGNOSIS — E7800 Pure hypercholesterolemia, unspecified: Secondary | ICD-10-CM | POA: Insufficient documentation

## 2012-07-29 LAB — URINE MICROSCOPIC-ADD ON

## 2012-07-29 LAB — COMPREHENSIVE METABOLIC PANEL
ALT: 16 U/L (ref 0–35)
AST: 21 U/L (ref 0–37)
Albumin: 3.9 g/dL (ref 3.5–5.2)
Alkaline Phosphatase: 48 U/L (ref 39–117)
BUN: 34 mg/dL — ABNORMAL HIGH (ref 6–23)
CO2: 26 mEq/L (ref 19–32)
Calcium: 10.3 mg/dL (ref 8.4–10.5)
Chloride: 100 mEq/L (ref 96–112)
Creatinine, Ser: 1.48 mg/dL — ABNORMAL HIGH (ref 0.50–1.10)
GFR calc Af Amer: 37 mL/min — ABNORMAL LOW (ref >90)
GFR calc non Af Amer: 32 mL/min — ABNORMAL LOW (ref >90)
Glucose, Bld: 163 mg/dL — ABNORMAL HIGH (ref 70–99)
Potassium: 4.3 mEq/L (ref 3.5–5.1)
Sodium: 138 mEq/L (ref 135–145)
Total Bilirubin: 0.3 mg/dL (ref 0.3–1.2)
Total Protein: 7.7 g/dL (ref 6.0–8.3)

## 2012-07-29 LAB — URINALYSIS, ROUTINE W REFLEX MICROSCOPIC
Bilirubin Urine: NEGATIVE
Glucose, UA: 250 mg/dL — AB
Hgb urine dipstick: NEGATIVE
Ketones, ur: NEGATIVE mg/dL
Nitrite: NEGATIVE
Protein, ur: NEGATIVE mg/dL
Specific Gravity, Urine: 1.021 (ref 1.005–1.030)
Urobilinogen, UA: 0.2 mg/dL (ref 0.0–1.0)
pH: 5 (ref 5.0–8.0)

## 2012-07-29 LAB — POCT I-STAT, CHEM 8
BUN: 35 mg/dL — ABNORMAL HIGH (ref 6–23)
Calcium, Ion: 1.26 mmol/L (ref 1.13–1.30)
Chloride: 103 mEq/L (ref 96–112)
Creatinine, Ser: 1.4 mg/dL — ABNORMAL HIGH (ref 0.50–1.10)
Glucose, Bld: 165 mg/dL — ABNORMAL HIGH (ref 70–99)
HCT: 38 % (ref 36.0–46.0)
Hemoglobin: 12.9 g/dL (ref 12.0–15.0)
Potassium: 4.1 mEq/L (ref 3.5–5.1)
Sodium: 142 mEq/L (ref 135–145)
TCO2: 30 mmol/L (ref 0–100)

## 2012-07-29 LAB — GLUCOSE, CAPILLARY
Glucose-Capillary: 130 mg/dL — ABNORMAL HIGH (ref 70–99)
Glucose-Capillary: 148 mg/dL — ABNORMAL HIGH (ref 70–99)
Glucose-Capillary: 112 mg/dL — ABNORMAL HIGH (ref 70–99)

## 2012-07-29 LAB — CBC WITH DIFFERENTIAL/PLATELET
Basophils Absolute: 0.1 10*3/uL (ref 0.0–0.1)
Basophils Relative: 0 % (ref 0–1)
Eosinophils Absolute: 0.2 10*3/uL (ref 0.0–0.7)
Eosinophils Relative: 1 % (ref 0–5)
HCT: 37.1 % (ref 36.0–46.0)
Hemoglobin: 12.6 g/dL (ref 12.0–15.0)
Lymphocytes Relative: 41 % (ref 12–46)
Lymphs Abs: 5.3 10*3/uL — ABNORMAL HIGH (ref 0.7–4.0)
MCH: 30.4 pg (ref 26.0–34.0)
MCHC: 34 g/dL (ref 30.0–36.0)
MCV: 89.6 fL (ref 78.0–100.0)
Monocytes Absolute: 1 10*3/uL (ref 0.1–1.0)
Monocytes Relative: 8 % (ref 3–12)
Neutro Abs: 6.4 10*3/uL (ref 1.7–7.7)
Neutrophils Relative %: 50 % (ref 43–77)
Platelets: 296 10*3/uL (ref 150–400)
RBC: 4.14 MIL/uL (ref 3.87–5.11)
RDW: 13.2 % (ref 11.5–15.5)
WBC: 12.9 10*3/uL — ABNORMAL HIGH (ref 4.0–10.5)

## 2012-07-29 LAB — RAPID URINE DRUG SCREEN, HOSP PERFORMED
Amphetamines: NOT DETECTED
Barbiturates: NOT DETECTED
Benzodiazepines: NOT DETECTED
Cocaine: NOT DETECTED
Opiates: NOT DETECTED
Tetrahydrocannabinol: NOT DETECTED

## 2012-07-29 LAB — BLOOD GAS, ARTERIAL
Acid-Base Excess: 1.1 mmol/L (ref 0.0–2.0)
Bicarbonate: 25.5 mEq/L — ABNORMAL HIGH (ref 20.0–24.0)
Drawn by: 31276
FIO2: 0.21 %
O2 Saturation: 95.6 %
Patient temperature: 98.6
TCO2: 26.8 mmol/L (ref 0–100)
pCO2 arterial: 42.8 mmHg (ref 35.0–45.0)
pH, Arterial: 7.393 (ref 7.350–7.450)
pO2, Arterial: 74.9 mmHg — ABNORMAL LOW (ref 80.0–100.0)

## 2012-07-29 LAB — ETHANOL: Alcohol, Ethyl (B): <11 mg/dL (ref 0–11)

## 2012-07-29 LAB — PROTIME-INR
INR: 1 (ref 0.00–1.49)
Prothrombin Time: 13.1 seconds (ref 11.6–15.2)

## 2012-07-29 LAB — POCT I-STAT 3, ART BLOOD GAS (G3+)
Acid-Base Excess: 2 mmol/L (ref 0.0–2.0)
Bicarbonate: 29 mEq/L — ABNORMAL HIGH (ref 20.0–24.0)
O2 Saturation: 98 %
Patient temperature: 98.6
TCO2: 31 mmol/L (ref 0–100)
pCO2 arterial: 53.5 mmHg — ABNORMAL HIGH (ref 35.0–45.0)
pH, Arterial: 7.342 — ABNORMAL LOW (ref 7.350–7.450)
pO2, Arterial: 117 mmHg — ABNORMAL HIGH (ref 80.0–100.0)

## 2012-07-29 LAB — TROPONIN I
Troponin I: <0.3 ng/mL (ref <0.30)
Troponin I: <0.3 ng/mL (ref <0.30)

## 2012-07-29 LAB — AMMONIA: Ammonia: 36 umol/L (ref 11–60)

## 2012-07-29 LAB — LACTIC ACID, PLASMA: Lactic Acid, Venous: 2.1 mmol/L (ref 0.5–2.2)

## 2012-07-29 LAB — TSH: TSH: 0.196 u[IU]/mL — ABNORMAL LOW (ref 0.350–4.500)

## 2012-07-29 MED ORDER — INSULIN ASPART 100 UNIT/ML ~~LOC~~ SOLN
0.0000 [IU] | Freq: Three times a day (TID) | SUBCUTANEOUS | Status: DC
  Administered 2012-07-29 – 2012-07-30 (×2): 1 [IU] via SUBCUTANEOUS
  Administered 2012-07-30: 2 [IU] via SUBCUTANEOUS

## 2012-07-29 MED ORDER — SODIUM CHLORIDE 0.9 % IJ SOLN
3.0000 mL | Freq: Two times a day (BID) | INTRAMUSCULAR | Status: DC
  Administered 2012-07-29: 3 mL via INTRAVENOUS

## 2012-07-29 MED ORDER — ATROPINE SULFATE 1 MG/ML IJ SOLN
0.5000 mg | Freq: Once | INTRAMUSCULAR | Status: AC
  Administered 2012-07-29: 0.5 mg via INTRAVENOUS
  Filled 2012-07-29: qty 1

## 2012-07-29 MED ORDER — NALOXONE HCL 1 MG/ML IJ SOLN
1.0000 mg | INTRAMUSCULAR | Status: DC | PRN
  Administered 2012-07-29: 1 mg via INTRAVENOUS
  Filled 2012-07-29 (×2): qty 2

## 2012-07-29 MED ORDER — SODIUM CHLORIDE 0.9 % IV SOLN
INTRAVENOUS | Status: AC
  Administered 2012-07-29: 18:00:00 via INTRAVENOUS

## 2012-07-29 MED ORDER — HEPARIN SODIUM (PORCINE) 5000 UNIT/ML IJ SOLN
5000.0000 [IU] | Freq: Three times a day (TID) | INTRAMUSCULAR | Status: DC
  Administered 2012-07-29 – 2012-07-30 (×3): 5000 [IU] via SUBCUTANEOUS
  Filled 2012-07-29 (×6): qty 1

## 2012-07-29 NOTE — ED Notes (Signed)
Pt from home.  Per EMS, pt's husband states pt was lethargic this morning and difficult to arouse.  Pt with hx of headaches and states she's had one for a week.  Pt had MRI Thursday and was told everything was ok.  Pt with multiple meds that the husband is not sure why she takes them including Tramadol, Percocet, Neurontin, Klonopin.  Pt is SB with HR 43-45.

## 2012-07-29 NOTE — ED Notes (Signed)
Admit MD at bedside

## 2012-07-29 NOTE — ED Notes (Signed)
Pt's CBG is 130. RN Varney Biles notified.

## 2012-07-29 NOTE — H&P (Addendum)
[facility] History and Physical  [name] [id number] DOB: 27-Apr-1929 DOA: 07/29/2012  Referring physician: Dr. Monia Pouch PCP: Alesia Richards, MD  Specialists: none  Chief Complaint: AMS  HPI: [name] is a 79 y.o. female has a past medical history significant for insulin-dependent diabetes, poorly controlled (last hemoglobin A1c was 10.0 in 2010), chronic pain for which he is taking hydrocodone, oxycodone (we do not have evidence of oxycodone in Epic however son endorses seeing this medication at her place), hypertension presents with a chief complaint of altered mental status that started this morning. She lives with her husband, and this morning he wasn't able to wake her up. The husband reports that in the last couple weeks she was complaining of intermittent headaches, some dizziness. She was evaluated by her primary care Dr. and there was concern about the stroke, she had an MRI about a week ago and per family that was negative. When EMS arrived at their home, she was normotensive, her sugars were 155 however her heart rate was in the upper 40s. Chart review shows that she had an ED visit in 2013, and at that time her heart rate was 59-60. She had a brief visit in September of 2013 for trigger finger release, and heart rate was recorded as 57-60. Her husband denies that patient complained of chest pain, shortness of breath, abdominal pain, fevers or chills. Last week she was prescribed Prednisone (unclear why), as well as Meclizine for vertigo. Her husband reports that she had an episode like this on Friday when he wasn't able to wake her up, however she improved on her own. On Saturday she was at baseline, alert and able to participate in conversations.  Review of Systems: Unable to obtain a full review of systems secondary to her mental status  Past Medical History  Diagnosis Date  . Liver lesion   . Chronic back pain   . Diabetes mellitus   . Hypercholesteremia    . Hypertension   . Neuropathy   . Arthritis   . Diabetic neuropathy   . Wears glasses    Past Surgical History  Procedure Laterality Date  . Abdominal hysterectomy    . Cesarean section      x 3   . Back surgery  1993    lumb lam-fusion  . Appendectomy    . Colonoscopy    . Eye surgery      both cataracts  . Trigger finger release  01/10/2012    Procedure: RELEASE TRIGGER FINGER/A-1 PULLEY;  Surgeon: Hessie Dibble, MD;  Location: [place];  Service: Orthopedics;  Laterality: Right;  right ring trigger release   Social History:  reports that she has never smoked. She has never used smokeless tobacco. She reports that she does not drink alcohol or use illicit drugs.  Allergies  Allergen Reactions  . Morphine And Related Other (See Comments)    fuzzy feeling    Family History  Problem Relation Age of Onset  . Colon cancer Neg Hx   . Liver disease Sister   . Diabetes Sister     Prior to Admission medications   Medication Sig Start Date End Date Taking? Authorizing Provider  insulin glargine (LANTUS) 100 UNIT/ML injection Inject 15 Units into the skin at bedtime.    Yes Historical Provider, MD  insulin lispro (HUMALOG) 100 UNIT/ML injection Inject 5-10 Units into the skin 3 (three) times daily before meals.    Yes Historical Provider, MD  aspirin  81 MG tablet Take 81 mg by mouth daily.    Historical Provider, MD  Cholecalciferol (VITAMIN D3) 2000 UNITS TABS Take 1 capsule by mouth 2 (two) times daily.    Historical Provider, MD  fenofibrate micronized (LOFIBRA) 134 MG capsule Take 134 mg by mouth daily.    Historical Provider, MD  gabapentin (NEURONTIN) 300 MG capsule Take 300 mg by mouth 2 (two) times daily.     Historical Provider, MD  hydrochlorothiazide (MICROZIDE) 12.5 MG capsule Take 12.5 mg by mouth daily.    Historical Provider, MD  HYDROcodone-acetaminophen (NORCO) 5-325 MG per tablet Take 1 tablet by mouth every 6 (six) hours as needed for pain.  01/10/12   Otelia Limes Carnaghi, PA-C  losartan (COZAAR) 100 MG tablet Take 100 mg by mouth daily.    Historical Provider, MD  metFORMIN (GLUCOPHAGE) 500 MG tablet Take 1,000 mg by mouth every evening.     Historical Provider, MD  Multiple Vitamins-Minerals (OCUVITE PO) Take by mouth as directed.    Historical Provider, MD  pravastatin (PRAVACHOL) 40 MG tablet Take 40 mg by mouth every evening.    Historical Provider, MD  traMADol (ULTRAM) 50 MG tablet Take 50 mg by mouth as directed.    Historical Provider, MD  vitamin B-12 (CYANOCOBALAMIN) 1000 MCG tablet Take 1,000 mcg by mouth 2 (two) times daily.    Historical Provider, MD   Physical Exam: Filed Vitals:   07/29/12 1359 07/29/12 1400 07/29/12 1430 07/29/12 1500  BP: 158/57 164/62 155/58 152/61  Pulse: 49 50 49 50  Temp:      TempSrc:      Resp: 18 11 11 12   SpO2: 100% 100% 100% 100%     General:  Asleep, wakes up when prompted, confused to place and time, oriented to person. Knows birthdate and recognizes family members.   Eyes: PERRL, EOMI  ENT: moist oropharynx; mild bilateral erythematous TMs without bulging or discharges  Neck: supple, no JVD  Cardiovascular: RRR without MRG, bradycardic  Respiratory: CTA biL, good air movement without wheezing, rhonchi or crackled  Abdomen: soft, non tender to palpation, positive bowel sounds, no guarding, no rebound  Skin: no rashes  Musculoskeletal: no peripheral edema  Psychiatric: normal mood and affect  Neurologic: CN 2-12 grossly intact, MS 5/5 in all 4; slow speech, slurred, but improving once she was more awake. No pronator drift.   Labs on Admission:  Basic Metabolic Panel:  Recent Labs Lab 07/29/12 1127 07/29/12 1153  NA 138 142  K 4.3 4.1  CL 100 103  CO2 26  --   GLUCOSE 163* 165*  BUN 34* 35*  CREATININE 1.48* 1.40*  CALCIUM 10.3  --    Liver Function Tests:  Recent Labs Lab 07/29/12 1127  AST 21  ALT 16  ALKPHOS 48  BILITOT 0.3  PROT 7.7  ALBUMIN  3.9    Recent Labs Lab 07/29/12 1145  AMMONIA 36   CBC:  Recent Labs Lab 07/29/12 1127 07/29/12 1153  WBC 12.9*  --   NEUTROABS 6.4  --   HGB 12.6 12.9  HCT 37.1 38.0  MCV 89.6  --   PLT 296  --    CBG:  Recent Labs Lab 07/29/12 1247  GLUCAP 130*    Radiological Exams on Admission: Ct Head Wo Contrast  07/29/2012  *RADIOLOGY REPORT*  Clinical Data: Lethargy, headache, hypertension  CT HEAD WITHOUT CONTRAST  Technique:  Contiguous axial images were obtained from the base of the skull through the  vertex without contrast.  Comparison: None.  Findings: Atherosclerotic and physiologic intracranial calcifications.  Mild parenchymal atrophy. Patchy areas of hypoattenuation in  periventricular white matter bilaterally. Negative for acute intracranial hemorrhage, mass lesion, acute infarction, midline shift, or mass-effect. Acute infarct may be inapparent on noncontrast CT. Ventricles and sulci symmetric. Bone windows demonstrate no focal lesion.  IMPRESSION:  1. Negative for bleed or other acute intracranial process.  2. Atrophy and nonspecific white matter changes   Original Report Authenticated By: D. Wallace Going, MD    Dg Chest Port 1 View  07/29/2012  *RADIOLOGY REPORT*  Clinical Data: Altered mental status.  PORTABLE CHEST - 1 VIEW  Comparison: 11/16/2011.  Findings: Cardiomegaly.  Central pulmonary vascular prominence. Mild elevation right hemidiaphragm.  No segmental consolidation, frank pulmonary edema or gross pneumothorax.  IMPRESSION: Cardiomegaly.   Original Report Authenticated By: Genia Del, M.D.     EKG: Independently reviewed.  Assessment/Plan Active Problems:   Acute encephalopathy   Hypertension   Bradycardia   Chronic pain   Diabetes   Acute encephalopathy - This may be related to her multiple pain medications. Patient did respond to Narcan in the ED, however it is very confusing as her UDS is negative. - given dizziness, episode of unresponsiveness Fri,  will obtain MRI/MRA brain. - with her slurred speech, bradycardia and AMS will check a TSH - able to protect her airway. ABG shows mild hypercarbia. Does not need O2. - NPO until she wakes up more, hold home medications.  - continue supportive treatment for now, repeat ABG later - may be related to Neurontin.  Bradycardia - she is in sinus bradycardia; has had low heart rates in the past. She is not hypotensive.  - asked for telemetry floor.  - 2D echo - will obtain 3 sets of cardiac enzymes  DM - will check HBA1C - SSI for now. R/o hypoglycemia first as she had Lantus last night then can start back her home regimen - hold Metformin  Acute on chronic renal failure - baseline Cr 1.3 in 2013, now close at 1.4. Monitor  Leukocytosis - not septic appearing, no fevers, cough, UTI - likely related to recent Prednisone - has bilateral tympanic membranes mildly erythematous. MRI should provide some information whether suspicion for infection, but less likely.  DVT Prophylaxis - heparin s.q.  Code Status: Full  Family Communication: husband and son at bedside  Disposition Plan: telemetry  Time spent: 4  Chozen Latulippe M. Cruzita Lederer, MD [facility] Pager [phone]  If 7PM-7AM, please contact night-coverage [email or web address] Password [facility] 07/29/2012, 3:25 PM

## 2012-07-29 NOTE — Progress Notes (Signed)
RT spoke with RN about verifying MD still wants ABG for pt. Pt is able to state name and DOB but slightly confused. RT unaware of pt baseline. Per RN pt's mental status has neither improved or declined. RN will notify RT if ABG is still needed.

## 2012-07-29 NOTE — ED Provider Notes (Signed)
History     CSN: [id number]  Arrival date & time 07/29/12  1039   First MD Initiated Contact with Patient 07/29/12 1104      Chief Complaint  Patient presents with  . Altered Mental Status    (Consider location/radiation/quality/duration/timing/severity/associated sxs/prior treatment) Patient is a 79 y.o. female presenting with altered mental status. The history is provided by the spouse and medical records. No language interpreter was used.  Altered Mental Status Chronicity: [name] husband says that he could not awaken her this morning and therefore called EMS and had her brought to [facility] [place]. The current episode started 3 to 5 hours ago. The problem occurs constantly. The problem has not changed since onset.Associated symptoms comments: She has a history of headaches, and had a recent MRI ordered by her primary physician, [name] M.D., which reportedly was -3 days ago.. Nothing aggravates the symptoms. Nothing relieves the symptoms. Treatments tried: Patient was brought to [facility] via EMS. The treatment provided no relief.    Past Medical History  Diagnosis Date  . Liver lesion   . Chronic back pain   . Diabetes mellitus   . Hypercholesteremia   . Hypertension   . Neuropathy   . Arthritis   . Diabetic neuropathy   . Wears glasses     Past Surgical History  Procedure Laterality Date  . Abdominal hysterectomy    . Cesarean section      x 3   . Back surgery  1993    lumb lam-fusion  . Appendectomy    . Colonoscopy    . Eye surgery      both cataracts  . Trigger finger release  01/10/2012    Procedure: RELEASE TRIGGER FINGER/A-1 PULLEY;  Surgeon: Hessie Dibble, MD;  Location: [place];  Service: Orthopedics;  Laterality: Right;  right ring trigger release    Family History  Problem Relation Age of Onset  . Colon cancer Neg Hx   . Liver disease Sister   . Diabetes Sister     History  Substance Use Topics  . Smoking status:  Never Smoker   . Smokeless tobacco: Never Used  . Alcohol Use: No    OB History   Grav Para Term Preterm Abortions TAB SAB Ect Mult Living                  Review of Systems  Unable to perform ROS: Mental status change  Psychiatric/Behavioral: Positive for altered mental status.    Allergies  Morphine and related  Home Medications   Current Outpatient Rx  Name  Route  Sig  Dispense  Refill  . aspirin 81 MG tablet   Oral   Take 81 mg by mouth daily.         . Cholecalciferol (VITAMIN D3) 2000 UNITS TABS   Oral   Take 1 capsule by mouth 2 (two) times daily.         . fenofibrate micronized (LOFIBRA) 134 MG capsule   Oral   Take 134 mg by mouth daily.         Marland Kitchen gabapentin (NEURONTIN) 300 MG capsule   Oral   Take 300 mg by mouth 2 (two) times daily.          . hydrochlorothiazide (MICROZIDE) 12.5 MG capsule   Oral   Take 12.5 mg by mouth daily.         Marland Kitchen HYDROcodone-acetaminophen (NORCO) 5-325 MG per tablet   Oral  Take 1 tablet by mouth every 6 (six) hours as needed for pain.   30 tablet   0   . insulin glargine (LANTUS) 100 UNIT/ML injection   Subcutaneous   Inject 40 Units into the skin daily.         . insulin lispro (HUMALOG) 100 UNIT/ML injection   Subcutaneous   Inject 10 Units into the skin 3 (three) times daily before meals.         Marland Kitchen losartan (COZAAR) 100 MG tablet   Oral   Take 100 mg by mouth daily.         . metFORMIN (GLUCOPHAGE) 500 MG tablet   Oral   Take 1,000 mg by mouth every evening.          . Multiple Vitamins-Minerals (OCUVITE PO)   Oral   Take by mouth as directed.         . pravastatin (PRAVACHOL) 40 MG tablet   Oral   Take 40 mg by mouth every evening.         . traMADol (ULTRAM) 50 MG tablet   Oral   Take 50 mg by mouth as directed.         . vitamin B-12 (CYANOCOBALAMIN) 1000 MCG tablet   Oral   Take 1,000 mcg by mouth 2 (two) times daily.           BP 151/61  Pulse 45  Temp(Src)  97.4 F (36.3 C) (Oral)  Resp 16  SpO2 100%  Physical Exam  Nursing note and vitals reviewed. Constitutional: She appears well-developed and well-nourished.  Patient is awake, but does not answer questions. She has a resting rate of 43.  HENT:  Head: Normocephalic and atraumatic.  Right Ear: External ear normal.  Left Ear: External ear normal.  Mouth/Throat: Oropharynx is clear and moist.  Eyes: Conjunctivae and EOM are normal. Pupils are equal, round, and reactive to light.  Neck: Normal range of motion. Neck supple.  Cardiovascular: Regular rhythm and normal heart sounds.   Resting bradycardia of 43.  Pulmonary/Chest: Effort normal and breath sounds normal.  Abdominal: Soft. Bowel sounds are normal. She exhibits no distension. There is no tenderness.  Musculoskeletal: Normal range of motion. She exhibits no edema and no tenderness.  Neurological:   Awake but does not answer questions. Sensation and motor function appear intact.  Skin: Skin is warm and dry.  Psychiatric:  Unable to assess.    ED Course  Procedures (including critical care time)  11:04 AM  Date: 07/29/2012  Rate: 46  Rhythm: sinus bradycardia  QRS Axis: normal  Intervals: PR prolonged  ST/T Wave abnormalities: normal  Conduction Disutrbances:first-degree A-V block   Narrative Interpretation: Borderline EKG  Old EKG Reviewed: unchanged  11:28 AM Patient was seen and had physical examination. History was obtained from her husband. IV atropine and IV naloxone ordered. Laboratory testing was ordered.  Results for orders placed during the hospital encounter of 07/29/12  CBC WITH DIFFERENTIAL      Result Value Range   WBC 12.9 (*) 4.0 - 10.5 K/uL   RBC 4.14  3.87 - 5.11 MIL/uL   Hemoglobin 12.6  12.0 - 15.0 g/dL   HCT 37.1  36.0 - 46.0 %   MCV 89.6  78.0 - 100.0 fL   MCH 30.4  26.0 - 34.0 pg   MCHC 34.0  30.0 - 36.0 g/dL   RDW 13.2  11.5 - 15.5 %   Platelets 296  150 - 400 K/uL  Neutrophils  Relative 50  43 - 77 %   Neutro Abs 6.4  1.7 - 7.7 K/uL   Lymphocytes Relative 41  12 - 46 %   Lymphs Abs 5.3 (*) 0.7 - 4.0 K/uL   Monocytes Relative 8  3 - 12 %   Monocytes Absolute 1.0  0.1 - 1.0 K/uL   Eosinophils Relative 1  0 - 5 %   Eosinophils Absolute 0.2  0.0 - 0.7 K/uL   Basophils Relative 0  0 - 1 %   Basophils Absolute 0.1  0.0 - 0.1 K/uL  AMMONIA      Result Value Range   Ammonia 36  11 - 60 umol/L  COMPREHENSIVE METABOLIC PANEL      Result Value Range   Sodium 138  135 - 145 mEq/L   Potassium 4.3  3.5 - 5.1 mEq/L   Chloride 100  96 - 112 mEq/L   CO2 26  19 - 32 mEq/L   Glucose, Bld 163 (*) 70 - 99 mg/dL   BUN 34 (*) 6 - 23 mg/dL   Creatinine, Ser 1.48 (*) 0.50 - 1.10 mg/dL   Calcium 10.3  8.4 - 10.5 mg/dL   Total Protein 7.7  6.0 - 8.3 g/dL   Albumin 3.9  3.5 - 5.2 g/dL   AST 21  0 - 37 U/L   ALT 16  0 - 35 U/L   Alkaline Phosphatase 48  39 - 117 U/L   Total Bilirubin 0.3  0.3 - 1.2 mg/dL   GFR calc non Af Amer 32 (*) >90 mL/min   GFR calc Af Amer 37 (*) >90 mL/min  ETHANOL      Result Value Range   Alcohol, Ethyl (B) <11  0 - 11 mg/dL  LACTIC ACID, PLASMA      Result Value Range   Lactic Acid, Venous 2.1  0.5 - 2.2 mmol/L  PROTIME-INR      Result Value Range   Prothrombin Time 13.1  11.6 - 15.2 seconds   INR 1.00  0.00 - 1.49  URINALYSIS, ROUTINE W REFLEX MICROSCOPIC      Result Value Range   Color, Urine YELLOW  YELLOW   APPearance CLEAR  CLEAR   Specific Gravity, Urine 1.021  1.005 - 1.030   pH 5.0  5.0 - 8.0   Glucose, UA 250 (*) NEGATIVE mg/dL   Hgb urine dipstick NEGATIVE  NEGATIVE   Bilirubin Urine NEGATIVE  NEGATIVE   Ketones, ur NEGATIVE  NEGATIVE mg/dL   Protein, ur NEGATIVE  NEGATIVE mg/dL   Urobilinogen, UA 0.2  0.0 - 1.0 mg/dL   Nitrite NEGATIVE  NEGATIVE   Leukocytes, UA SMALL (*) NEGATIVE  URINE MICROSCOPIC-ADD ON      Result Value Range   Squamous Epithelial / LPF RARE  RARE   WBC, UA 3-6  <3 WBC/hpf   Bacteria, UA RARE  RARE   POCT I-STAT, CHEM 8      Result Value Range   Sodium 142  135 - 145 mEq/L   Potassium 4.1  3.5 - 5.1 mEq/L   Chloride 103  96 - 112 mEq/L   BUN 35 (*) 6 - 23 mg/dL   Creatinine, Ser 1.40 (*) 0.50 - 1.10 mg/dL   Glucose, Bld 165 (*) 70 - 99 mg/dL   Calcium, Ion 1.26  1.13 - 1.30 mmol/L   TCO2 30  0 - 100 mmol/L   Hemoglobin 12.9  12.0 - 15.0 g/dL   HCT 38.0  36.0 - 46.0 %  POCT I-STAT 3, BLOOD GAS (G3+)      Result Value Range   pH, Arterial 7.342 (*) 7.350 - 7.450   pCO2 arterial 53.5 (*) 35.0 - 45.0 mmHg   pO2, Arterial 117.0 (*) 80.0 - 100.0 mmHg   Bicarbonate 29.0 (*) 20.0 - 24.0 mEq/L   TCO2 31  0 - 100 mmol/L   O2 Saturation 98.0     Acid-Base Excess 2.0  0.0 - 2.0 mmol/L   Patient temperature 98.6 F     Collection site RADIAL, ALLEN'S TEST ACCEPTABLE     Drawn by RT     Sample type ARTERIAL     Dg Chest Port 1 View  07/29/2012  *RADIOLOGY REPORT*  Clinical Data: Altered mental status.  PORTABLE CHEST - 1 VIEW  Comparison: 11/16/2011.  Findings: Cardiomegaly.  Central pulmonary vascular prominence. Mild elevation right hemidiaphragm.  No segmental consolidation, frank pulmonary edema or gross pneumothorax.  IMPRESSION: Cardiomegaly.   Original Report Authenticated By: Genia Del, M.D.    1:17 PM Lab workup non-revealing up to now.  Still waiting on UDS and head CT.  Results up to this point reviewed with pt's husband.   2:08 PM Head CT and UDS both negative.  Call to [facility] to admit her for altered mental status.  She is somewhat more alert now, able to speak a little in response to questions.  2:17 PM Case discussed with Dr. Renne Crigler --> admit to observation status to a telemetry bed to [facility].    1. Altered mental status   2. Bradycardia            Mylinda Latina III, MD 07/29/12 1420

## 2012-07-30 DIAGNOSIS — E119 Type 2 diabetes mellitus without complications: Secondary | ICD-10-CM

## 2012-07-30 DIAGNOSIS — I495 Sick sinus syndrome: Secondary | ICD-10-CM

## 2012-07-30 DIAGNOSIS — R4182 Altered mental status, unspecified: Secondary | ICD-10-CM

## 2012-07-30 DIAGNOSIS — I10 Essential (primary) hypertension: Secondary | ICD-10-CM

## 2012-07-30 DIAGNOSIS — I498 Other specified cardiac arrhythmias: Secondary | ICD-10-CM

## 2012-07-30 LAB — HEMOGLOBIN A1C
Hgb A1c MFr Bld: 7.3 % — ABNORMAL HIGH (ref <5.7)
Mean Plasma Glucose: 163 mg/dL — ABNORMAL HIGH (ref <117)

## 2012-07-30 LAB — GLUCOSE, CAPILLARY
Glucose-Capillary: 124 mg/dL — ABNORMAL HIGH (ref 70–99)
Glucose-Capillary: 218 mg/dL — ABNORMAL HIGH (ref 70–99)
Glucose-Capillary: 144 mg/dL — ABNORMAL HIGH (ref 70–99)

## 2012-07-30 LAB — T4, FREE: Free T4: 1.07 ng/dL (ref 0.80–1.80)

## 2012-07-30 LAB — TROPONIN I: Troponin I: <0.3 ng/mL (ref <0.30)

## 2012-07-30 LAB — T3, FREE: T3, Free: 2 pg/mL — ABNORMAL LOW (ref 2.3–4.2)

## 2012-07-30 MED ORDER — LOSARTAN POTASSIUM 100 MG PO TABS: 100.0000 mg | ORAL_TABLET | Freq: Every day | ORAL | Status: DC

## 2012-07-30 MED ORDER — METFORMIN HCL 500 MG PO TABS: 1000.0000 mg | ORAL_TABLET | Freq: Every evening | ORAL | Status: DC

## 2012-07-30 MED ORDER — LOSARTAN POTASSIUM 50 MG PO TABS: 100.0000 mg | ORAL_TABLET | Freq: Every day | ORAL | Status: DC

## 2012-07-30 MED ORDER — HYDROCHLOROTHIAZIDE 12.5 MG PO CAPS
12.5000 mg | ORAL_CAPSULE | Freq: Every day | ORAL | Status: DC
  Administered 2012-07-30: 12.5 mg via ORAL
  Filled 2012-07-30: qty 1

## 2012-07-30 MED ORDER — INSULIN GLARGINE 100 UNIT/ML ~~LOC~~ SOLN: 25.0000 [IU] | Freq: Every day | SUBCUTANEOUS | Status: DC

## 2012-07-30 MED ORDER — LOSARTAN POTASSIUM 50 MG PO TABS
100.0000 mg | ORAL_TABLET | Freq: Every day | ORAL | Status: DC
  Administered 2012-07-30: 100 mg via ORAL
  Filled 2012-07-30: qty 2

## 2012-07-30 MED ORDER — HYDRALAZINE HCL 20 MG/ML IJ SOLN
5.0000 mg | Freq: Once | INTRAMUSCULAR | Status: AC
  Administered 2012-07-30: 5 mg via INTRAVENOUS
  Filled 2012-07-30: qty 1

## 2012-07-30 NOTE — Discharge Summary (Addendum)
Physician Discharge Summary  [name] [id number] DOB: 1929-12-18 DOA: 07/29/2012  PCP: Kelsey Richards, MD  Admit date: 07/29/2012 Discharge date: 07/30/2012  Time spent: 30 minutes  Recommendations for Outpatient Follow-up:  1. Follow up with PCP in 1 week check a b-met  Discharge Diagnoses:  Active Problems:   Acute encephalopathy   Hypertension   Bradycardia   Chronic pain   Diabetes   Discharge Condition: stable  Diet recommendation: carb modified medication  Filed Weights   07/29/12 1603  Weight: 67.586 kg (149 lb)    History of present illness:  79 y.o. female has a past medical history significant for insulin-dependent diabetes, poorly controlled (last hemoglobin A1c was 10.0 in 2010), chronic pain for which he is taking hydrocodone, oxycodone (we do not have evidence of oxycodone in Epic however son endorses seeing this medication at her place), hypertension presents with a chief complaint of altered mental status that started this morning. She lives with her husband, and this morning he wasn't able to wake her up. The husband reports that in the last couple weeks she was complaining of intermittent headaches, some dizziness. She was evaluated by her primary care Dr. and there was concern about the stroke, she had an MRI about a week ago and per family that was negative. When EMS arrived at their home, she was normotensive, her sugars were 155 however her heart rate was in the upper 40s. Chart review shows that she had an ED visit in 2013, and at that time her heart rate was 59-60. She had a brief visit in September of 2013 for trigger finger release, and heart rate was recorded as 57-60. Her husband denies that patient complained of chest pain, shortness of breath, abdominal pain, fevers or chills. Last week she was prescribed Prednisone (unclear why), as well as Meclizine for vertigo. Her husband reports that she had an episode like this on Friday when he wasn't able  to wake her up, however she improved on her own. On Saturday she was at baseline, alert and able to participate in conversations.   Hospital Course:  Acute encephalopathy:  - ct head 4.6.2014: Negative for bleed or other acute intracranial process  - MRI 4.6.2014 No acute infarct.  - pt is narcotics at home, she did respond to narcan.  - cont ASA.  - most likely cause of her encephalopathy was multifactorial AKI and narcotics.  Hypertension:  - she was intially orthostatic, this resolved. - will only restart The ACE in 1 week.  - hold the diuretic until she sees her PCP.  - b-met in  1 week.  Bradycardia:  - sinus brady and first degree AV block on EKG.  - not on rate controlling medications at home.  Diabetes  - restart metformin as outpatient.  - cont SSI.  - HBgA1c 7.3   Procedures:  CT head: no acute IC findings.  MRI head no acute infarct  Consultations:  none  Discharge Exam: Filed Vitals:   07/29/12 1603 07/29/12 2159 07/30/12 0240 07/30/12 0554  BP: 156/70 152/59 156/60 183/61  Pulse: 48 43 45 45  Temp: 97.7 F (36.5 C) 97.7 F (36.5 C) 97.4 F (36.3 C) 97.4 F (36.3 C)  TempSrc: Axillary Axillary Oral Oral  Resp: 14 15 18 20   Height: 5\' 2"  (1.575 m)     Weight: 67.586 kg (149 lb)     SpO2: 96% 97% 98% 93%    General: a&o X3 Cardiovascular: RRR Respiratory: good air movement,CTAB/L  Discharge Instructions  Discharge Orders   Future Orders Complete By Expires     Diet - low sodium heart healthy  As directed     Increase activity slowly  As directed         Medication List    STOP taking these medications       hydrochlorothiazide 12.5 MG capsule  Commonly known as:  MICROZIDE      TAKE these medications       aspirin 81 MG tablet  Take 81 mg by mouth daily.     fenofibrate micronized 134 MG capsule  Commonly known as:  LOFIBRA  Take 134 mg by mouth daily.     gabapentin 300 MG capsule  Commonly known as:  NEURONTIN  Take 300  mg by mouth 2 (two) times daily.     HUMALOG 100 UNIT/ML injection  Generic drug:  insulin lispro  Inject 5-10 Units into the skin 3 (three) times daily before meals.     HYDROcodone-acetaminophen 5-325 MG per tablet  Commonly known as:  NORCO  Take 1 tablet by mouth every 6 (six) hours as needed for pain.     insulin glargine 100 UNIT/ML injection  Commonly known as:  LANTUS  Inject 0.25 mLs (25 Units total) into the skin at bedtime.     losartan 100 MG tablet  Commonly known as:  COZAAR  Take 1 tablet (100 mg total) by mouth daily.  Start taking on:  08/03/2012     metFORMIN 500 MG tablet  Commonly known as:  GLUCOPHAGE  Take 1,000 mg by mouth every evening.     OCUVITE PO  Take by mouth as directed.     pravastatin 40 MG tablet  Commonly known as:  PRAVACHOL  Take 40 mg by mouth every evening.     traMADol 50 MG tablet  Commonly known as:  ULTRAM  Take 50 mg by mouth as directed.     vitamin B-12 1000 MCG tablet  Commonly known as:  CYANOCOBALAMIN  Take 1,000 mcg by mouth 2 (two) times daily.     Vitamin D3 2000 UNITS Tabs  Take 1 capsule by mouth 2 (two) times daily.          The results of significant diagnostics from this hospitalization (including imaging, microbiology, ancillary and laboratory) are listed below for reference.    Significant Diagnostic Studies: Ct Head Wo Contrast  07/29/2012  *RADIOLOGY REPORT*  Clinical Data: Lethargy, headache, hypertension  CT HEAD WITHOUT CONTRAST  Technique:  Contiguous axial images were obtained from the base of the skull through the vertex without contrast.  Comparison: None.  Findings: Atherosclerotic and physiologic intracranial calcifications.  Mild parenchymal atrophy. Patchy areas of hypoattenuation in  periventricular white matter bilaterally. Negative for acute intracranial hemorrhage, mass lesion, acute infarction, midline shift, or mass-effect. Acute infarct may be inapparent on noncontrast CT. Ventricles and  sulci symmetric. Bone windows demonstrate no focal lesion.  IMPRESSION:  1. Negative for bleed or other acute intracranial process.  2. Atrophy and nonspecific white matter changes   Original Report Authenticated By: D. Wallace Going, MD    Mr [name] Head Wo Contrast  07/29/2012  *RADIOLOGY REPORT*  Clinical Data:  Diabetic hypertensive patient presenting with altered mental status.  MRI BRAIN WITHOUT CONTRAST MRA HEAD WITHOUT CONTRAST  Technique: Multiplanar, multiecho pulse sequences of the brain and surrounding structures were obtained according to standard protocol without intravenous contrast.  Angiographic images of the head were obtained using MRA technique  without contrast.  Comparison: 07/29/2012 CT.  No comparison brain MR.  MRI HEAD  Findings:  Exam is slightly motion degraded.  No acute infarct.  No intracranial hemorrhage.  Mild small vessel disease type changes.  Mild global atrophy without hydrocephalus.  No intracranial mass lesion detected on this unenhanced exam.  Transverse ligament hypertrophy.  IMPRESSION: No acute infarct.  Please see above.  MRA HEAD  Findings: No significant stenosis of the internal carotid arteries or M1 segment of the middle cerebral arteries.  Moderate narrowing A1 segment right anterior cerebral artery.  Moderate narrowing middle cerebral artery branch vessels bilaterally.  Slightly irregular mildly ectatic vertebral arteries and basilar artery without high-grade stenosis.  Nonvisualization AICAs.  Narrowing proximal left superior cerebellar artery.  Moderate narrowing proximal posterior cerebral artery bilaterally. High-grade stenosis P2-P3 segment right posterior cerebral artery. Posterior cerebral artery branch vessel irregularity and narrowing bilaterally.  No aneurysm or vascular malformation.  IMPRESSION: Intracranial atherosclerotic type changes as detailed above most notable involving the posterior cerebral arteries.   Original Report Authenticated By: Genia Del,  M.D.    Mr Brain Wo Contrast  07/29/2012  *RADIOLOGY REPORT*  Clinical Data:  Diabetic hypertensive patient presenting with altered mental status.  MRI BRAIN WITHOUT CONTRAST MRA HEAD WITHOUT CONTRAST  Technique: Multiplanar, multiecho pulse sequences of the brain and surrounding structures were obtained according to standard protocol without intravenous contrast.  Angiographic images of the head were obtained using MRA technique without contrast.  Comparison: 07/29/2012 CT.  No comparison brain MR.  MRI HEAD  Findings:  Exam is slightly motion degraded.  No acute infarct.  No intracranial hemorrhage.  Mild small vessel disease type changes.  Mild global atrophy without hydrocephalus.  No intracranial mass lesion detected on this unenhanced exam.  Transverse ligament hypertrophy.  IMPRESSION: No acute infarct.  Please see above.  MRA HEAD  Findings: No significant stenosis of the internal carotid arteries or M1 segment of the middle cerebral arteries.  Moderate narrowing A1 segment right anterior cerebral artery.  Moderate narrowing middle cerebral artery branch vessels bilaterally.  Slightly irregular mildly ectatic vertebral arteries and basilar artery without high-grade stenosis.  Nonvisualization AICAs.  Narrowing proximal left superior cerebellar artery.  Moderate narrowing proximal posterior cerebral artery bilaterally. High-grade stenosis P2-P3 segment right posterior cerebral artery. Posterior cerebral artery branch vessel irregularity and narrowing bilaterally.  No aneurysm or vascular malformation.  IMPRESSION: Intracranial atherosclerotic type changes as detailed above most notable involving the posterior cerebral arteries.   Original Report Authenticated By: Genia Del, M.D.    Dg Chest Port 1 View  07/29/2012  *RADIOLOGY REPORT*  Clinical Data: Altered mental status.  PORTABLE CHEST - 1 VIEW  Comparison: 11/16/2011.  Findings: Cardiomegaly.  Central pulmonary vascular prominence. Mild elevation  right hemidiaphragm.  No segmental consolidation, frank pulmonary edema or gross pneumothorax.  IMPRESSION: Cardiomegaly.   Original Report Authenticated By: Genia Del, M.D.     Microbiology: No results found for this or any previous visit (from the past 240 hour(s)).   Labs: Basic Metabolic Panel:  Recent Labs Lab 07/29/12 1127 07/29/12 1153  NA 138 142  K 4.3 4.1  CL 100 103  CO2 26  --   GLUCOSE 163* 165*  BUN 34* 35*  CREATININE 1.48* 1.40*  CALCIUM 10.3  --    Liver Function Tests:  Recent Labs Lab 07/29/12 1127  AST 21  ALT 16  ALKPHOS 48  BILITOT 0.3  PROT 7.7  ALBUMIN 3.9   No results found  for this basename: LIPASE, AMYLASE,  in the last 168 hours  Recent Labs Lab 07/29/12 1145  AMMONIA 36   CBC:  Recent Labs Lab 07/29/12 1127 07/29/12 1153  WBC 12.9*  --   NEUTROABS 6.4  --   HGB 12.6 12.9  HCT 37.1 38.0  MCV 89.6  --   PLT 296  --    Cardiac Enzymes:  Recent Labs Lab 07/29/12 1521 07/29/12 2025 07/30/12 0137  TROPONINI <0.30 <0.30 <0.30   BNP: BNP (last 3 results) No results found for this basename: PROBNP,  in the last 8760 hours CBG:  Recent Labs Lab 07/29/12 1247 07/29/12 1616 07/29/12 2156 07/30/12 0529 07/30/12 0819  GLUCAP 130* 148* 112* 124* 218*       Signed:  Charlynne Cousins  [facility] 07/30/2012, 10:34 AM

## 2012-07-30 NOTE — Progress Notes (Signed)
[facility] PROGRESS NOTE  Assessment/Plan:  Acute encephalopathy: - ct head 4.6.2014: Negative for bleed or other acute intracranial process - MRI 4.6.2014 No acute infarct. - pt is narcotics at home, she did respond to narcan. - cont ASA.    Hypertension: - will only restart  The ACE in 1 week. - hold the diuretic until she sees her PCP.    Bradycardia: - sinus brady and first degree AV block on EKG. - not on rate controlling medications at home.   Diabetes - restart metformin as outpatient. - cont SSI. - HBgA1c 7.3    Code Status: Full  Family Communication: husband and son at bedside  Disposition Plan: telemetry   Consultants:  none  Procedures:  CT 4.6.2014  MRI 4.7.2014  Antibiotics:  none  HPI/Subjective:SHe relates she is more alert less dizzy with standing.  Objective: Filed Vitals:   07/29/12 1603 07/29/12 2159 07/30/12 0240 07/30/12 0554  BP: 156/70 152/59 156/60 183/61  Pulse: 48 43 45 45  Temp: 97.7 F (36.5 C) 97.7 F (36.5 C) 97.4 F (36.3 C) 97.4 F (36.3 C)  TempSrc: Axillary Axillary Oral Oral  Resp: 14 15 18 20   Height: 5\' 2"  (1.575 m)     Weight: 67.586 kg (149 lb)     SpO2: 96% 97% 98% 93%    Intake/Output Summary (Last 24 hours) at 07/30/12 1011 Last data filed at 07/30/12 0900  Gross per 24 hour  Intake    360 ml  Output   1100 ml  Net   -740 ml   Filed Weights   07/29/12 1603  Weight: 67.586 kg (149 lb)    Exam:  General: Alert, awake, oriented x3, in no acute distress.  HEENT: No bruits, no goiter.  Heart: Regular rate and rhythm, without murmurs, rubs, gallops.  Lungs: Good air movement, bilateral air movement.  Abdomen: Soft, nontender, nondistended, positive bowel sounds.  Neuro: Grossly intact, nonfocal.   Data Reviewed: Basic Metabolic Panel:  Recent Labs Lab 07/29/12 1127 07/29/12 1153  NA 138 142  K 4.3 4.1  CL 100 103  CO2 26  --   GLUCOSE 163* 165*  BUN 34* 35*  CREATININE 1.48*  1.40*  CALCIUM 10.3  --    Liver Function Tests:  Recent Labs Lab 07/29/12 1127  AST 21  ALT 16  ALKPHOS 48  BILITOT 0.3  PROT 7.7  ALBUMIN 3.9   No results found for this basename: LIPASE, AMYLASE,  in the last 168 hours  Recent Labs Lab 07/29/12 1145  AMMONIA 36   CBC:  Recent Labs Lab 07/29/12 1127 07/29/12 1153  WBC 12.9*  --   NEUTROABS 6.4  --   HGB 12.6 12.9  HCT 37.1 38.0  MCV 89.6  --   PLT 296  --    Cardiac Enzymes:  Recent Labs Lab 07/29/12 1521 07/29/12 2025 07/30/12 0137  TROPONINI <0.30 <0.30 <0.30   BNP (last 3 results) No results found for this basename: PROBNP,  in the last 8760 hours CBG:  Recent Labs Lab 07/29/12 1247 07/29/12 1616 07/29/12 2156 07/30/12 0529 07/30/12 0819  GLUCAP 130* 148* 112* 124* 218*    No results found for this or any previous visit (from the past 240 hour(s)).   Studies: Ct Head Wo Contrast  07/29/2012  *RADIOLOGY REPORT*  Clinical Data: Lethargy, headache, hypertension  CT HEAD WITHOUT CONTRAST  Technique:  Contiguous axial images were obtained from the base of the skull through the vertex without contrast.  Comparison: None.  Findings: Atherosclerotic and physiologic intracranial calcifications.  Mild parenchymal atrophy. Patchy areas of hypoattenuation in  periventricular white matter bilaterally. Negative for acute intracranial hemorrhage, mass lesion, acute infarction, midline shift, or mass-effect. Acute infarct may be inapparent on noncontrast CT. Ventricles and sulci symmetric. Bone windows demonstrate no focal lesion.  IMPRESSION:  1. Negative for bleed or other acute intracranial process.  2. Atrophy and nonspecific white matter changes   Original Report Authenticated By: D. Wallace Going, MD    Mr [name] Head Wo Contrast  07/29/2012  *RADIOLOGY REPORT*  Clinical Data:  Diabetic hypertensive patient presenting with altered mental status.  MRI BRAIN WITHOUT CONTRAST MRA HEAD WITHOUT CONTRAST  Technique:  Multiplanar, multiecho pulse sequences of the brain and surrounding structures were obtained according to standard protocol without intravenous contrast.  Angiographic images of the head were obtained using MRA technique without contrast.  Comparison: 07/29/2012 CT.  No comparison brain MR.  MRI HEAD  Findings:  Exam is slightly motion degraded.  No acute infarct.  No intracranial hemorrhage.  Mild small vessel disease type changes.  Mild global atrophy without hydrocephalus.  No intracranial mass lesion detected on this unenhanced exam.  Transverse ligament hypertrophy.  IMPRESSION: No acute infarct.  Please see above.  MRA HEAD  Findings: No significant stenosis of the internal carotid arteries or M1 segment of the middle cerebral arteries.  Moderate narrowing A1 segment right anterior cerebral artery.  Moderate narrowing middle cerebral artery branch vessels bilaterally.  Slightly irregular mildly ectatic vertebral arteries and basilar artery without high-grade stenosis.  Nonvisualization AICAs.  Narrowing proximal left superior cerebellar artery.  Moderate narrowing proximal posterior cerebral artery bilaterally. High-grade stenosis P2-P3 segment right posterior cerebral artery. Posterior cerebral artery branch vessel irregularity and narrowing bilaterally.  No aneurysm or vascular malformation.  IMPRESSION: Intracranial atherosclerotic type changes as detailed above most notable involving the posterior cerebral arteries.   Original Report Authenticated By: Genia Del, M.D.    Mr Brain Wo Contrast  07/29/2012  *RADIOLOGY REPORT*  Clinical Data:  Diabetic hypertensive patient presenting with altered mental status.  MRI BRAIN WITHOUT CONTRAST MRA HEAD WITHOUT CONTRAST  Technique: Multiplanar, multiecho pulse sequences of the brain and surrounding structures were obtained according to standard protocol without intravenous contrast.  Angiographic images of the head were obtained using MRA technique without  contrast.  Comparison: 07/29/2012 CT.  No comparison brain MR.  MRI HEAD  Findings:  Exam is slightly motion degraded.  No acute infarct.  No intracranial hemorrhage.  Mild small vessel disease type changes.  Mild global atrophy without hydrocephalus.  No intracranial mass lesion detected on this unenhanced exam.  Transverse ligament hypertrophy.  IMPRESSION: No acute infarct.  Please see above.  MRA HEAD  Findings: No significant stenosis of the internal carotid arteries or M1 segment of the middle cerebral arteries.  Moderate narrowing A1 segment right anterior cerebral artery.  Moderate narrowing middle cerebral artery branch vessels bilaterally.  Slightly irregular mildly ectatic vertebral arteries and basilar artery without high-grade stenosis.  Nonvisualization AICAs.  Narrowing proximal left superior cerebellar artery.  Moderate narrowing proximal posterior cerebral artery bilaterally. High-grade stenosis P2-P3 segment right posterior cerebral artery. Posterior cerebral artery branch vessel irregularity and narrowing bilaterally.  No aneurysm or vascular malformation.  IMPRESSION: Intracranial atherosclerotic type changes as detailed above most notable involving the posterior cerebral arteries.   Original Report Authenticated By: Genia Del, M.D.    Dg Chest Port 1 View  07/29/2012  *RADIOLOGY REPORT*  Clinical  Data: Altered mental status.  PORTABLE CHEST - 1 VIEW  Comparison: 11/16/2011.  Findings: Cardiomegaly.  Central pulmonary vascular prominence. Mild elevation right hemidiaphragm.  No segmental consolidation, frank pulmonary edema or gross pneumothorax.  IMPRESSION: Cardiomegaly.   Original Report Authenticated By: Genia Del, M.D.     Scheduled Meds: . heparin  5,000 Units Subcutaneous Q8H  . insulin aspart  0-9 Units Subcutaneous TID WC  . sodium chloride  3 mL Intravenous Q12H   Continuous Infusions:    Charlynne Cousins  [facility] Pager [phone]. If 8PM-8AM, please  contact night-coverage at [email or web address], password [facility] 07/30/2012, 10:11 AM  LOS: 1 day

## 2012-07-30 NOTE — Evaluation (Signed)
Physical Therapy Evaluation Patient Details Name: [name] MRN: [id number] DOB: 10/19/1929 Today's Date: 07/30/2012 Time: [id number] PT Time Calculation (min): 15 min  PT Assessment / Plan / Recommendation Clinical Impression  Pt adm with encephalopathy.  Pt's amb very close to baseline per family but it appears she has some balance issues that could be improved with HHPT. Pt agreeable to HHPT.    PT Assessment  All further PT needs can be met in the next venue of care    Follow Up Recommendations  Home health PT    Does the patient have the potential to tolerate intense rehabilitation      Barriers to Discharge        Equipment Recommendations  None recommended by PT    Recommendations for Other Services     Frequency      Precautions / Restrictions Precautions Precautions: Fall   Pertinent Vitals/Pain N/A      Mobility  Bed Mobility Bed Mobility: Supine to Sit;Sitting - Scoot to Edge of Bed Supine to Sit: 6: Modified independent (Device/Increase time);HOB elevated Sitting - Scoot to Edge of Bed: 7: Independent Transfers Transfers: Sit to Stand;Stand to Sit Sit to Stand: 6: Modified independent (Device/Increase time) Stand to Sit: 7: Independent Details for Transfer Assistance: Pt braced back of legs on bed when coming to stand. Ambulation/Gait Ambulation/Gait Assistance: 5: Supervision;6: Modified independent (Device/Increase time) Ambulation Distance (Feet): 200 Feet Assistive device: None Ambulation/Gait Assistance Details: In small environment such as room pt mod I.  In hallway needed supervision. Gait Pattern: Step-through pattern;Decreased step length - right;Decreased step length - left;Narrow base of support Gait velocity: decr    Exercises     PT Diagnosis: Abnormality of gait  PT Problem List: Decreased balance;Decreased mobility PT Treatment Interventions:     PT Goals    Visit Information  Last PT Received On: 07/30/12 Assistance  Needed: +1    Subjective Data  Subjective: "I almost have," pt stated about falling when asked if she had fallen at home. 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 difficulties    Solicitor Overall Cognitive Status: Impaired Area of Impairment: Memory Arousal/Alertness: Awake/alert Behavior During Session: WFL for tasks performed Memory Deficits: Decr memory of recent events    Extremity/Trunk Assessment Right Lower Extremity Assessment RLE ROM/Strength/Tone: WFL for tasks assessed Left Lower Extremity Assessment LLE ROM/Strength/Tone: WFL for tasks assessed   Balance Balance Balance Assessed: Yes Static Standing Balance Static Standing - Balance Support: No upper extremity supported Static Standing - Level of Assistance: 6: Modified independent (Device/Increase time)  End of Session PT - End of Session Activity Tolerance: Patient tolerated treatment well Patient left: in bed;with call bell/phone within reach;with family/visitor present;with bed alarm set  GP Functional Assessment Tool Used: clinical judgement Functional Limitation: Mobility: Walking and moving around Mobility: Walking and Moving Around Current Status [phone]): At least 1 percent but less than 20 percent impaired, limited or restricted Mobility: Walking and Moving Around Goal Status [phone]): 0 percent impaired, limited or restricted Mobility: Walking and Moving Around Discharge Status [phone]): At least 1 percent but less than 20 percent impaired, limited or restricted   [facility] 07/30/2012, 12:30 PM  [place]

## 2012-07-30 NOTE — Care Management Note (Signed)
    Page 1 of 2   07/30/2012     12:34:30 PM   CARE MANAGEMENT NOTE 07/30/2012  Patient:  [name], [name]   Account Number:  [id number]  Date Initiated:  07/30/2012  Documentation initiated by:  Tomi Bamberger  Subjective/Objective Assessment:   dx acute encephalopahty  admit as observation- lives with spouse.     Action/Plan:   pt eval- rec hhpt   Anticipated DC Date:  07/30/2012   Anticipated DC Plan:  [place]  CM consult      [facility] Choice  HOME HEALTH   Choice offered to / List presented to:  C-1 Patient        [place] arranged  HH-1 RN  [place].   Status of service:  Completed, signed off Medicare Important Message given?   (If response is "NO", the following Medicare IM given date fields will be blank) Date Medicare IM given:   Date Additional Medicare IM given:    Discharge Disposition:  [place]  Per UR Regulation:  Reviewed for med. necessity/level of care/duration of stay  If discussed at [place] of Stay Meetings, dates discussed:    Comments:  07/30/12 12:32 Tomi Bamberger RN, BSN [phone] patient lives with spouse, patient is for discharge today, patient chose [facility] from agency list for [facility] for medication management and hhpt.  Referral made to [facility] [place] notified.  Soc will begin 24-48 hrs post discharge.

## 2012-07-30 NOTE — Progress Notes (Signed)
NURSING PROGRESS NOTE  REJINA LUNDIN [id number] Discharge Data: 07/30/2012 5:11 PM Attending Provider: No att. providers found [id number] DAVID, MD     Sheralyn Boatman to be D/C'd Home per MD order.  Discussed with the patient and family the After Visit Summary and all questions fully answered. All IV's discontinued with no bleeding noted. All belongings returned to patient for patient to take home.   Last Vital Signs:  Blood pressure 133/63, pulse 52, temperature 98.2 F (36.8 C), temperature source Oral, resp. rate 18, height 5\' 2"  (1.575 m), weight 67.586 kg (149 lb), SpO2 93.00%.  Discharge Medication List   Medication List    STOP taking these medications       hydrochlorothiazide 12.5 MG capsule  Commonly known as:  MICROZIDE      TAKE these medications       aspirin 81 MG tablet  Take 81 mg by mouth daily.     beta carotene w/minerals tablet  Take 1 tablet by mouth daily.     fenofibrate micronized 134 MG capsule  Commonly known as:  LOFIBRA  Take 134 mg by mouth daily before breakfast.     gabapentin 300 MG capsule  Commonly known as:  NEURONTIN  Take 300 mg by mouth 2 (two) times daily.     HUMALOG 100 UNIT/ML injection  Generic drug:  insulin lispro  Inject 5-10 Units into the skin 3 (three) times daily before meals.     insulin glargine 100 UNIT/ML injection  Commonly known as:  LANTUS  Inject 0.25 mLs (25 Units total) into the skin at bedtime.     losartan 100 MG tablet  Commonly known as:  COZAAR  Take 1 tablet (100 mg total) by mouth daily.  Start taking on:  08/03/2012     meclizine 25 MG tablet  Commonly known as:  ANTIVERT  Take 25 mg by mouth 3 (three) times daily as needed for dizziness.     metFORMIN 500 MG tablet  Commonly known as:  GLUCOPHAGE  Take 1,000 mg by mouth every evening.     pravastatin 40 MG tablet  Commonly known as:  PRAVACHOL  Take 40 mg by mouth every evening.     solifenacin 10 MG tablet  Commonly known as:   VESICARE  Take 5-10 mg by mouth daily.     traMADol 50 MG tablet  Commonly known as:  ULTRAM  Take 50 mg by mouth every 8 (eight) hours as needed for pain.     vitamin B-12 1000 MCG tablet  Commonly known as:  CYANOCOBALAMIN  Take 1,000 mcg by mouth 2 (two) times daily.     Vitamin D3 2000 UNITS Tabs  Take 1 capsule by mouth 2 (two) times daily.         Joslyn Hy RN, MSN, [employer]

## 2012-07-30 NOTE — Progress Notes (Signed)
  Echocardiogram 2D Echocardiogram has been performed.  [name] 07/30/2012, 9:54 AM

## 2012-09-26 ENCOUNTER — Other Ambulatory Visit: Payer: Self-pay | Admitting: Neurosurgery

## 2012-09-26 DIAGNOSIS — M47817 Spondylosis without myelopathy or radiculopathy, lumbosacral region: Secondary | ICD-10-CM

## 2012-10-05 ENCOUNTER — Ambulatory Visit
Admission: RE | Admit: 2012-10-05 | Discharge: 2012-10-05 | Disposition: A | Discharge status: Home / Self Care | Payer: Medicare Other | Source: Ambulatory Visit | Attending: Neurosurgery | Admitting: Neurosurgery

## 2012-10-05 DIAGNOSIS — M47817 Spondylosis without myelopathy or radiculopathy, lumbosacral region: Secondary | ICD-10-CM

## 2012-10-05 MED ORDER — GADOBENATE DIMEGLUMINE 529 MG/ML IV SOLN
7.0000 mL | Freq: Once | INTRAVENOUS | Status: AC | PRN
  Administered 2012-10-05: 7 mL via INTRAVENOUS

## 2013-01-30 ENCOUNTER — Other Ambulatory Visit: Payer: Self-pay

## 2013-01-30 DIAGNOSIS — Z1231 Encounter for screening mammogram for malignant neoplasm of breast: Secondary | ICD-10-CM

## 2013-03-07 ENCOUNTER — Ambulatory Visit
Admission: RE | Admit: 2013-03-07 | Discharge: 2013-03-07 | Disposition: A | Discharge status: Home / Self Care | Payer: Medicare Other | Source: Ambulatory Visit

## 2013-03-07 DIAGNOSIS — Z1231 Encounter for screening mammogram for malignant neoplasm of breast: Secondary | ICD-10-CM

## 2013-03-07 LAB — HM MAMMOGRAPHY: HM Mammogram: NORMAL

## 2013-03-18 ENCOUNTER — Other Ambulatory Visit: Payer: Self-pay | Admitting: Emergency Medicine

## 2013-03-18 ENCOUNTER — Ambulatory Visit (INDEPENDENT_AMBULATORY_CARE_PROVIDER_SITE_OTHER): Payer: Medicare Other

## 2013-03-18 DIAGNOSIS — G2581 Restless legs syndrome: Secondary | ICD-10-CM

## 2013-03-18 DIAGNOSIS — G629 Polyneuropathy, unspecified: Secondary | ICD-10-CM

## 2013-03-18 DIAGNOSIS — G589 Mononeuropathy, unspecified: Secondary | ICD-10-CM

## 2013-03-18 MED ORDER — GABAPENTIN ENACARBIL ER 300 MG PO TBCR: 300.0000 mg | EXTENDED_RELEASE_TABLET | Freq: Every day | ORAL | Status: DC

## 2013-03-18 NOTE — Progress Notes (Signed)
Pt is currently taking Gabapentin 300 mg QID. Pt states her daughter was also taking Gabapentin QID and was switched to [place] and is taking that once a day. Pt is requesting a Rx for Horizion so she only has to take Rx once a day. Pt c/o pain in both feet and states the pain is keeping her up at night. She is taking Ropinirole HCl 3 mg QHS, but has no relief.

## 2013-03-18 NOTE — Progress Notes (Signed)
Patient will be placed on Horizant 300 mg and discontinue gabapentin. Advised may not be covered under insurance. Rx sent to [facility]

## 2013-04-02 ENCOUNTER — Encounter: Payer: Self-pay | Admitting: Internal Medicine

## 2013-04-02 DIAGNOSIS — E559 Vitamin D deficiency, unspecified: Secondary | ICD-10-CM | POA: Insufficient documentation

## 2013-04-02 DIAGNOSIS — E1122 Type 2 diabetes mellitus with diabetic chronic kidney disease: Secondary | ICD-10-CM | POA: Insufficient documentation

## 2013-04-02 DIAGNOSIS — E1169 Type 2 diabetes mellitus with other specified complication: Secondary | ICD-10-CM | POA: Insufficient documentation

## 2013-04-02 DIAGNOSIS — N184 Chronic kidney disease, stage 4 (severe): Secondary | ICD-10-CM | POA: Insufficient documentation

## 2013-04-03 ENCOUNTER — Encounter: Payer: Self-pay | Admitting: Emergency Medicine

## 2013-04-03 ENCOUNTER — Ambulatory Visit (INDEPENDENT_AMBULATORY_CARE_PROVIDER_SITE_OTHER): Payer: Medicare Other | Admitting: Emergency Medicine

## 2013-04-03 VITALS — BP 138/72 | HR 84 | Temp 98.2°F | Resp 18 | Wt 146.0 lb

## 2013-04-03 DIAGNOSIS — E109 Type 1 diabetes mellitus without complications: Secondary | ICD-10-CM

## 2013-04-03 DIAGNOSIS — E538 Deficiency of other specified B group vitamins: Secondary | ICD-10-CM

## 2013-04-03 DIAGNOSIS — G2581 Restless legs syndrome: Secondary | ICD-10-CM

## 2013-04-03 DIAGNOSIS — D649 Anemia, unspecified: Secondary | ICD-10-CM

## 2013-04-03 DIAGNOSIS — R5381 Other malaise: Secondary | ICD-10-CM

## 2013-04-03 DIAGNOSIS — E782 Mixed hyperlipidemia: Secondary | ICD-10-CM

## 2013-04-03 DIAGNOSIS — E559 Vitamin D deficiency, unspecified: Secondary | ICD-10-CM

## 2013-04-03 DIAGNOSIS — I10 Essential (primary) hypertension: Secondary | ICD-10-CM

## 2013-04-03 LAB — BASIC METABOLIC PANEL WITH GFR
BUN: 21 mg/dL (ref 6–23)
CO2: 29 mEq/L (ref 19–32)
Calcium: 10.2 mg/dL (ref 8.4–10.5)
Chloride: 101 mEq/L (ref 96–112)
Creat: 1.29 mg/dL — ABNORMAL HIGH (ref 0.50–1.10)
GFR, Est African American: 45 mL/min — ABNORMAL LOW
GFR, Est Non African American: 39 mL/min — ABNORMAL LOW
Glucose, Bld: 241 mg/dL — ABNORMAL HIGH (ref 70–99)
Potassium: 4.4 mEq/L (ref 3.5–5.3)
Sodium: 138 mEq/L (ref 135–145)

## 2013-04-03 LAB — CBC WITH DIFFERENTIAL/PLATELET
Basophils Absolute: 0 10*3/uL (ref 0.0–0.1)
Basophils Relative: 1 % (ref 0–1)
Eosinophils Absolute: 0.3 10*3/uL (ref 0.0–0.7)
Eosinophils Relative: 4 % (ref 0–5)
HCT: 34.9 % — ABNORMAL LOW (ref 36.0–46.0)
Hemoglobin: 12 g/dL (ref 12.0–15.0)
Lymphocytes Relative: 30 % (ref 12–46)
Lymphs Abs: 2.5 10*3/uL (ref 0.7–4.0)
MCH: 30.9 pg (ref 26.0–34.0)
MCHC: 34.4 g/dL (ref 30.0–36.0)
MCV: 89.9 fL (ref 78.0–100.0)
Monocytes Absolute: 0.9 10*3/uL (ref 0.1–1.0)
Monocytes Relative: 10 % (ref 3–12)
Neutro Abs: 4.6 10*3/uL (ref 1.7–7.7)
Neutrophils Relative %: 55 % (ref 43–77)
Platelets: 329 10*3/uL (ref 150–400)
RBC: 3.88 MIL/uL (ref 3.87–5.11)
RDW: 13.2 % (ref 11.5–15.5)
WBC: 8.3 10*3/uL (ref 4.0–10.5)

## 2013-04-03 LAB — IRON AND TIBC
%SAT: 21 % (ref 20–55)
Iron: 77 ug/dL (ref 42–145)
TIBC: 373 ug/dL (ref 250–470)
UIBC: 296 ug/dL (ref 125–400)

## 2013-04-03 LAB — HEPATIC FUNCTION PANEL
ALT: 15 U/L (ref 0–35)
AST: 19 U/L (ref 0–37)
Albumin: 4.5 g/dL (ref 3.5–5.2)
Alkaline Phosphatase: 56 U/L (ref 39–117)
Bilirubin, Direct: 0.1 mg/dL (ref 0.0–0.3)
Indirect Bilirubin: 0.2 mg/dL (ref 0.0–0.9)
Total Bilirubin: 0.3 mg/dL (ref 0.3–1.2)
Total Protein: 7.6 g/dL (ref 6.0–8.3)

## 2013-04-03 LAB — LIPID PANEL
Cholesterol: 176 mg/dL (ref 0–200)
HDL: 58 mg/dL (ref >39)
LDL Cholesterol: 91 mg/dL (ref 0–99)
Total CHOL/HDL Ratio: 3 Ratio
Triglycerides: 135 mg/dL (ref <150)
VLDL: 27 mg/dL (ref 0–40)

## 2013-04-03 LAB — MAGNESIUM: Magnesium: 2 mg/dL (ref 1.5–2.5)

## 2013-04-03 NOTE — Patient Instructions (Signed)
Restless Legs Syndrome Restless legs syndrome is a movement disorder. It may also be called a sensori-motor disorder.  CAUSES  No one knows what specifically causes restless legs syndrome, but it tends to run in families. It is also more common in people with low iron, in pregnancy, in people who need dialysis, and those with nerve damage (neuropathy).Some medications may make restless legs syndrome worse.Those medications include drugs to treat high blood pressure, some heart conditions, nausea, colds, allergies, and depression. SYMPTOMS Symptoms include uncomfortable sensations in the legs. These leg sensations are worse during periods of inactivity or rest. They are also worse while sitting or lying down. Individuals that have the disorder describe sensations in the legs that feel like:  Pulling.  Drawing.  Crawling.  Worming.  Boring.  Tingling.  Pins and needles.  Prickling.  Pain. The sensations are usually accompanied by an overwhelming urge to move the legs. Sudden muscle jerks may also occur. Movement provides temporary relief from the discomfort. In rare cases, the arms may also be affected. Symptoms may interfere with going to sleep (sleep onset insomnia). Restless legs syndrome may also be related to periodic limb movement disorder (PLMD). PLMD is another more common motor disorder. It also causes interrupted sleep. The symptoms from PLMD usually occur most often when you are awake. TREATMENT  Treatment for restless legs syndrome is symptomatic. This means that the symptoms are treated.   Massage and cold compresses may provide temporary relief.  Walk, stretch, or take a cold or hot bath.  Get regular exercise and a good night's sleep.  Avoid caffeine, alcohol, nicotine, and medications that can make it worse.  Do activities that provide mental stimulation like discussions, needlework, and video games. These may be helpful if you are not able to walk or  stretch. Some medications are effective in relieving the symptoms. However, many of these medications have side effects. Ask your caregiver about medications that may help your symptoms. Correcting iron deficiency may improve symptoms for some patients. Document Released: 04/01/2002 Document Revised: 07/04/2011 Document Reviewed: 07/08/2010 [employer] Patient Information 2014 [employer].  

## 2013-04-04 LAB — HEMOGLOBIN A1C
Hgb A1c MFr Bld: 8.9 % — ABNORMAL HIGH (ref <5.7)
Mean Plasma Glucose: 209 mg/dL — ABNORMAL HIGH (ref <117)

## 2013-04-04 LAB — TSH: TSH: 1.829 u[IU]/mL (ref 0.350–4.500)

## 2013-04-04 LAB — VITAMIN B12: Vitamin B-12: >2000 pg/mL — ABNORMAL HIGH (ref 211–911)

## 2013-04-05 NOTE — Progress Notes (Signed)
Subjective:    Patient ID: [name], female    DOB: 06-04-1929, 79 y.o.   MRN: [id number]  HPI Comments: 79 yo female presents for 3 month F/U for HTN, Cholesterol, IDDM with neuropathy, D. Deficient, anemia. She reports she is not exercising routinely. She is not eating healthy. She has been checking BS QD with average around 100. She admits to adjusting her insulin without checking BS level, depending on how bad she's eating.  Her BP has been good.  She notes she is still with fatigue but has not tried to increase activity to see if any relief. She denies any triggers.  She notes her legs are still aching and Requip is not helping. She also notes pain can be at qhs or after sitting for long periods. She denies any recent trauma.    Current Outpatient Prescriptions on File Prior to Visit  Medication Sig Dispense Refill  . aspirin 81 MG tablet Take 81 mg by mouth daily.      . beta carotene w/minerals (OCUVITE) tablet Take 1 tablet by mouth daily.      . Cholecalciferol (VITAMIN D3) 2000 UNITS TABS Take 1 capsule by mouth 2 (two) times daily.      . fenofibrate micronized (LOFIBRA) 134 MG capsule Take 134 mg by mouth daily before breakfast.      . gabapentin (NEURONTIN) 300 MG capsule Take 300 mg by mouth. Bottle states patient can take TID-QID daily      . insulin lispro (HUMALOG) 100 UNIT/ML injection Inject 5-10 Units into the skin. Patient takes Rx on a sliding scale basis      . losartan (COZAAR) 100 MG tablet Take 1 tablet (100 mg total) by mouth daily.      . metFORMIN (GLUCOPHAGE) 500 MG tablet Take 1,000 mg by mouth every evening.       . pravastatin (PRAVACHOL) 40 MG tablet Take 40 mg by mouth every evening.      . traMADol (ULTRAM) 50 MG tablet Take 50 mg by mouth every 8 (eight) hours as needed for pain.      . vitamin B-12 (CYANOCOBALAMIN) 1000 MCG tablet Take 1,000 mcg by mouth 2 (two) times daily.      . meclizine (ANTIVERT) 25 MG tablet Take 25 mg by mouth 3 (three)  times daily as needed for dizziness.       No current facility-administered medications on file prior to visit.   ALLERGIES Morphine and related  Past Medical History  Diagnosis Date  . Liver lesion   . Chronic back pain   . Neuropathy   . Diabetic neuropathy   . Wears glasses   . Type II or unspecified type diabetes mellitus without mention of complication, not stated as uncontrolled   . Hypercholesteremia   . Hypertension   . Arthritis   . Vitamin D deficiency      Review of Systems  Constitutional: Positive for fatigue.  Musculoskeletal: Positive for arthralgias and myalgias.    BP 138/72  Pulse 84  Temp(Src) 98.2 F (36.8 C) (Temporal)  Resp 18  Wt 146 lb (66.225 kg)     Objective:   Physical Exam  Nursing note and vitals reviewed. Constitutional: She is oriented to person, place, and time. She appears well-developed and well-nourished. No distress.  HENT:  Head: Normocephalic and atraumatic.  Right Ear: External ear normal.  Left Ear: External ear normal.  Nose: Nose normal.  Mouth/Throat: Oropharynx is clear and moist.  Eyes: Conjunctivae  and EOM are normal.  Neck: Normal range of motion. Neck supple. No JVD present. No thyromegaly present.  Cardiovascular: Normal rate, regular rhythm, normal heart sounds and intact distal pulses.   Pulmonary/Chest: Effort normal and breath sounds normal.  Abdominal: Soft. Bowel sounds are normal. She exhibits no distension and no mass. There is no tenderness. There is no rebound and no guarding.  Musculoskeletal: Normal range of motion. She exhibits no edema and no tenderness.  Lymphadenopathy:    She has no cervical adenopathy.  Neurological: She is alert and oriented to person, place, and time. No cranial nerve deficit.  Skin: Skin is warm and dry. No rash noted. No erythema. No pallor.  Psychiatric: She has a normal mood and affect. Her behavior is normal. Judgment and thought content normal.          Assessment  & Plan:   1.  3 month F/U for HTN, Cholesterol, IDDM with neuropathy, D. Deficient. Needs healthy diet, cardio QD and obtain healthy weight. Check Labs, Check BP if >130/80 or BS >150 call office. Advise needs to check BS TID with insulin. 2. Fatigue/ anemia- check labs, increase activity and H2O 3. Leg pain with history of neuropathy vs circulation- advised support stockings, elevation, increase activity and better DM control

## 2013-04-26 ENCOUNTER — Other Ambulatory Visit: Payer: Self-pay | Admitting: Emergency Medicine

## 2013-04-26 MED ORDER — AMMONIUM LACTATE 12 % EX LOTN: TOPICAL_LOTION | CUTANEOUS | Status: DC

## 2013-05-01 ENCOUNTER — Other Ambulatory Visit: Payer: Self-pay | Admitting: Internal Medicine

## 2013-05-08 ENCOUNTER — Ambulatory Visit: Payer: Self-pay | Admitting: Emergency Medicine

## 2013-06-01 ENCOUNTER — Other Ambulatory Visit: Payer: Self-pay | Admitting: Internal Medicine

## 2013-06-04 ENCOUNTER — Ambulatory Visit (INDEPENDENT_AMBULATORY_CARE_PROVIDER_SITE_OTHER): Payer: Medicare Other | Admitting: Internal Medicine

## 2013-06-04 ENCOUNTER — Encounter: Payer: Self-pay | Admitting: Internal Medicine

## 2013-06-04 VITALS — BP 152/80 | HR 64 | Temp 97.9°F | Resp 16 | Wt 147.2 lb

## 2013-06-04 DIAGNOSIS — I10 Essential (primary) hypertension: Secondary | ICD-10-CM

## 2013-06-04 DIAGNOSIS — Z79899 Other long term (current) drug therapy: Secondary | ICD-10-CM

## 2013-06-04 DIAGNOSIS — E1049 Type 1 diabetes mellitus with other diabetic neurological complication: Secondary | ICD-10-CM

## 2013-06-04 DIAGNOSIS — E1140 Type 2 diabetes mellitus with diabetic neuropathy, unspecified: Secondary | ICD-10-CM | POA: Insufficient documentation

## 2013-06-04 MED ORDER — ROPINIROLE HCL 1 MG PO TABS: ORAL_TABLET | ORAL | Status: DC

## 2013-06-04 MED ORDER — MAGNESIUM 400 MG PO CAPS: 500.0000 mg | ORAL_CAPSULE | Freq: Two times a day (BID) | ORAL | Status: DC

## 2013-06-04 MED ORDER — LORATADINE 10 MG PO TABS
10.0000 mg | ORAL_TABLET | Freq: Every day | ORAL | Status: DC
Start: 2013-06-04 — End: 2013-08-06

## 2013-06-04 MED ORDER — BISOPROLOL-HYDROCHLOROTHIAZIDE 5-6.25 MG PO TABS: 1.0000 | ORAL_TABLET | Freq: Every day | ORAL | Status: DC

## 2013-06-04 MED ORDER — MELOXICAM 15 MG PO TABS: ORAL_TABLET | ORAL | Status: DC

## 2013-06-04 MED ORDER — INSULIN GLARGINE 100 UNIT/ML SOLOSTAR PEN: 30.0000 [IU] | PEN_INJECTOR | Freq: Every day | SUBCUTANEOUS | Status: DC

## 2013-06-04 MED ORDER — METFORMIN HCL 500 MG PO TABS: ORAL_TABLET | ORAL | Status: DC

## 2013-06-04 NOTE — Progress Notes (Signed)
Patient ID: [name], female   DOB: [birth year], 80 y.o.   MRN: [id number]   This very nice 80 y.o. female presents for discussion her Restless Legs Symptoms. Also , she discussed her nocturnal Sx's of painful aching in her feet and calves which awaken her and / or keep her awake . About 15-20 minutes was spent discussing and reviewing her medications. And correcting her med list. Last BUN/Cr 19/1.22 and calc GFR = 42 consistent with with moderate renal impairment     Medication List         ammonium lactate 12 % lotion  Commonly known as:  AMLACTIN  Apply to affected area daily     aspirin 81 MG tablet  Take 81 mg by mouth daily.     beta carotene w/minerals tablet  Take 1 tablet by mouth daily.     HAIR/SKIN/NAILS Tabs  Take by mouth 2 (two) times daily.     fenofibrate micronized 134 MG capsule  Commonly known as:  LOFIBRA  Take 134 mg by mouth daily before breakfast.     FREESTYLE LITE test strip  Generic drug:  glucose blood     gabapentin 300 MG capsule  Commonly known as:  NEURONTIN  Take 300 mg by mouth. Bottle states patient can take TID-QID daily     HUMALOG 100 UNIT/ML injection  Generic drug:  insulin lispro  Inject 5-10 Units into the skin. Patient takes Rx on a sliding scale basis     insulin glargine 100 UNIT/ML injection  Commonly known as:  LANTUS  Inject 50 Units into the skin at bedtime.     LANTUS SOLOSTAR 100 UNIT/ML Solostar Pen  Generic drug:  Insulin Glargine  INJECT 50 TO 80 UNITS EACH DAY AS DIRECTED     LORazepam 1 MG tablet  Commonly known as:  ATIVAN     losartan 100 MG tablet  Commonly known as:  COZAAR  Take 1 tablet (100 mg total) by mouth daily.     Magnesium 400 MG Caps  Take 400 mg by mouth 2 (two) times daily.     meclizine 25 MG tablet  Commonly known as:  ANTIVERT  Take 25 mg by mouth 3 (three) times daily as needed for dizziness.     meloxicam 15 MG tablet  Commonly known as:  MOBIC  Take 15 mg by mouth daily.      metFORMIN 500 MG tablet  Commonly known as:  GLUCOPHAGE  Take 1,000 mg by mouth every evening.     Omega 3 1000 MG Caps  Take by mouth daily.     pravastatin 40 MG tablet  Commonly known as:  PRAVACHOL  TAKE 1 TABLET BY MOUTH AT BEDTIME FOR CHOLESTEROL     rOPINIRole 1 MG tablet  Commonly known as:  REQUIP  Take 1 mg by mouth. Takes one pill at lunch, one pill at dinner and 2 pills at bedtime     traMADol 50 MG tablet  Commonly known as:  ULTRAM  Take 50 mg by mouth every 8 (eight) hours as needed for pain.     vitamin B-12 1000 MCG tablet  Commonly known as:  CYANOCOBALAMIN  Take 1,000 mcg by mouth 2 (two) times daily.     vitamin C 500 MG tablet  Commonly known as:  ASCORBIC ACID  Take 500 mg by mouth daily.     Vitamin D3 2000 UNITS Tabs  Take 1 capsule by mouth 2 (two) times daily.  Allergies  Allergen Reactions  . Morphine And Related Other (See Comments)    fuzzy feeling    PMHx:   Past Medical History  Diagnosis Date  . Liver lesion   . Chronic back pain   . Neuropathy   . Diabetic neuropathy   . Wears glasses   . Type II or unspecified type diabetes mellitus without mention of complication, not stated as uncontrolled   . Hypercholesteremia   . Hypertension   . Arthritis   . Vitamin D deficiency     FHx:    Reviewed / unchanged  SHx:    Reviewed / unchanged  Systems Review: Constitutional: Denies fever, chills, wt changes, headaches, insomnia, fatigue, night sweats, change in appetite. Eyes: Denies redness, blurred vision, diplopia, discharge, itchy, watery eyes.  ENT: Denies discharge, congestion, post nasal drip, epistaxis, sore throat, earache, hearing loss, dental pain, tinnitus, vertigo, sinus pain, snoring.  CV: Denies chest pain, palpitations, irregular heartbeat, syncope, dyspnea, diaphoresis, orthopnea, PND, claudication, edema. Respiratory: denies cough, dyspnea, DOE, pleurisy, hoarseness, laryngitis, wheezing.   Gastrointestinal: Denies dysphagia, odynophagia, heartburn, reflux, water brash, abdominal pain or cramps, nausea, vomiting, bloating, diarrhea, constipation, hematemesis, melena, hematochezia,  or hemorrhoids. Genitourinary: Denies dysuria, frequency, urgency, nocturia, hesitancy, discharge, hematuria, flank pain. Musculoskeletal: Denies arthralgias, myalgias, stiffness, jt. swelling, pain, limp, strain/sprain.  Skin: Denies pruritus, rash, hives, warts, acne, eczema, change in skin lesion(s). Neuro: No weakness, tremor, incoordination, spasms, paresthesia, or pain. Psychiatric: Denies confusion, memory loss, or sensory loss. Endo: Denies change in weight, skin, hair change.  Heme/Lymph: No excessive bleeding, bruising, orenlarged lymph nodes.  BP: 152/80  Pulse: 64  Temp: 97.9 F (36.6 C)  Resp: 16    Estimated body mass index is 26.92 kg/(m^2) as calculated from the following:   Height as of 07/29/12: 5\' 2"  (1.575 m).   Weight as of this encounter: 147 lb 3.2 oz (66.769 kg).  On Exam: Appears well nourished - in no distress. Eyes: PERRLA, EOMs, conjunctiva no swelling or erythema. Sinuses: No frontal/maxillary tenderness ENT/Mouth: EAC's clear, TM's nl w/o erythema, bulging. Nares clear w/o erythema, swelling, exudates. Oropharynx clear without erythema or exudates. Oral hygiene is good. Tongue normal, non obstructing. Hearing intact.  Neck: Supple. Thyroid nl. Car 2+/2+ without bruits, nodes or JVD. Chest: Respirations nl with BS clear & equal w/o rales, rhonchi, wheezing or stridor.  Cor: Heart sounds normal w/ regular rate and rhythm without sig. murmurs, gallops, clicks, or rubs. Peripheral pulses normal and equal  without edema.  Abdomen: Soft & bowel sounds normal. Non-tender w/o guarding, rebound, hernias, masses, or organomegaly.  Lymphatics: Unremarkable.  Musculoskeletal: Full ROM all peripheral extremities, joint stability, 5/5 strength, and normal gait.  Skin: Warm, dry  without exposed rashes, lesions, ecchymosis apparent.  Neuro: Cranial nerves intact, reflexes equal bilaterally. Sensory-motor testing grossly intact. Tendon reflexes grossly intact.  Pysch: Alert & oriented x 3. Insight and judgement nl & appropriate. No ideations.  Assessment and Plan:  1. Hypertension - Continue monitor blood pressure at home. As BP's have been up at home in the [id number] systolic range will add [place] -5 qam  2. Hyperlipidemia - Continue diet/meds, exercise,& lifestyle modifications. Continue monitor periodic cholesterol/liver & renal functions   3. T1 IDDM w/ Neuropathy and Nephropathy - continue recommend prudent low glycemic diet, weight control, regular exercise, diabetic monitoring and periodic eye exams. Discussed changing Gabapentin 300 mg dosing to 1 taab at suppertime and 2 or 3 tablets at bedtime to help with her nocturnal neuralgia  pains.  4. Vitamin D Deficiency - Continue supplementation.  Recommended regular exercise, BP monitoring, weight control, and discussed med and SE's. Recommended labs to assess and monitor clinical status. Further disposition pending results of labs.

## 2013-06-04 NOTE — Patient Instructions (Addendum)
Suggest you take your   (1)     Gabapentin 300 mg - 1 tablet at supper and 2 or 3 at bedtime  (2)     Also you may take 1 or 2 Tramadol tablets at bedtime if needed for leg pains    Diabetic Neuropathy Diabetic neuropathy is a nerve disease or nerve damage that is caused by diabetes mellitus. About half of all people with diabetes mellitus have some form of nerve damage. Nerve damage is more common in those who have had diabetes mellitus for many years and who generally have not had good control of their blood sugar (glucose) level. Diabetic neuropathy is a common complication of diabetes mellitus. There are three more common types of diabetic neuropathy and a fourth type that is less common and less understood:   Peripheral neuropathy This is the most common type of diabetic neuropathy. It causes damage to the nerves of the feet and legs first and then eventually the hands and arms.The damage affects the ability to sense touch.  Autonomic neuropathy This type causes damage to the autonomic nervous system, which controls the following functions:  Heartbeat.  Body temperature.  Blood pressure.  Urination.  Digestion.  Sweating.  Sexual function.  Focal neuropathy Focal neuropathy can be painful and unpredictable and occurs most often in older adults with diabetes mellitus. It involves a specific nerve or one area and often comes on suddenly. It usually does not cause long-term problems.  Radiculoplexus neuropathy Sometimes called lumbosacral radiculoplexus neuropathy, radiculoplexus neuropathy affects the nerves of the thighs, hips, buttocks, or legs. It is more common in people with type 2 diabetes mellitus and in older men. It is characterized by debilitating pain, weakness, and atrophy, usually in the thigh muscles. CAUSES  The cause of peripheral, autonomic, and focal neuropathies is diabetes mellitus that is uncontrolled and high glucose levels. The cause of radiculoplexus  neuropathy is unknown. However, it is thought to be caused by inflammation related to uncontrolled glucose levels. SIGNS AND SYMPTOMS  Peripheral Neuropathy Peripheral neuropathy develops slowly over time. When the nerves of the feet and legs no longer work there may be:   Burning, stabbing, or aching pain in the legs or feet.  Inability to feel pressure or pain in your feet. This can lead to:  Thick calluses over pressure areas.  Pressure sores.  Ulcers.  Foot deformities.  Reduced ability to feel temperature changes.  Muscle weakness. Autonomic Neuropathy The symptoms of autonomic neuropathy vary depending on which nerves are affected. Symptoms may include:  Problems with digestion, such as:  Feeling sick to your stomach (nausea).  Vomiting.  Bloating.  Constipation.  Diarrhea.  Abdominal pain.  Difficulty with urination. This occurs if you lose your ability to sense when your bladder is full. Problems include:  Urine leakage (incontinence).  Inability to empty your bladder completely (retention).  Rapid or irregular heartbeat (palpitations).  Blood pressure drops when you stand up (orthostatic hypotension). When you stand up you may feel:  Dizzy.  Weak.  Faint.  In men, inability to attain and maintain an erection.  In women, vaginal dryness and problems with decreased sexual desire and arousal.  Problems with body temperature regulation.  Increased or decreased sweating. Focal Neuropathy  Abnormal eye movements or abnormal alignment of both eyes.  Weakness in the wrist.  Foot drop. This results in an inability to lift the foot properly and abnormal walking or foot movement.  Paralysis on one side of your face Gloriann Loan  palsy).  Chest or abdominal pain. Radiculoplexus Neuropathy  Sudden, severe pain in your hip, thigh, or buttocks.  Weakness and wasting of thigh muscles.  Difficulty rising from a seated position.  Abdominal  swelling.  Unexplained weight loss (usually more than 10 lb [4.5 kg]). DIAGNOSIS  Peripheral Neuropathy Your senses may be tested. Sensory function testing can be done with:  A light touch using a monofilament.  A vibration with tuning fork.  A sharp sensation with a pin prick. Other tests that can help diagnose neuropathy are:  Nerve conduction velocity. This test checks the transmission of an electrical current through a nerve.  Electromyography. This shows how muscles respond to electrical signals transmitted by nearby nerves.  Quantitative sensory testing. This is used to assess how your nerves respond to vibrations and changes in temperature. Autonomic Neuropathy Diagnosis is often based on reported symptoms. Tell your health care provider if you experience:   Dizziness.   Constipation.   Diarrhea.   Inappropriate urination or inability to urinate.   Inability to get or maintain an erection.  Tests that may be done include:   Electrocardiography or Holter monitor. These are tests that can help show problems with the heart rate or heart rhythm.   An X-ray exam may be done. Focal Neuropathy Diagnosis is made based on your symptoms and what your health care provider finds during your exam. Other tests may be done. They may include:  Nerve conduction velocities. This checks the transmission of electrical current through a nerve.  Electromyography. This shows how muscles respond to electrical signals transmitted by nearby nerves.  Quantitative sensory testing. This test is used to assess how your nerves respond to vibration and changes in temperature. Radiculoplexus Neuropathy  Often the first thing is to eliminate any other issue or problems that might be the cause, as there is no stick test for diagnosis.  X-ray exam of your spine and lumbar region.  Spinal tap to rule out cancer.  MRI to rule out other lesions. TREATMENT  Once nerve damage occurs, it  cannot be reversed. The goal of treatment is to keep the disease or nerve damage from getting worse and affecting more nerve fibers. Controlling your blood glucose level is the key. Most people with radiculoplexus neuropathy see at least a partial improvement over time. You will need to keep your blood glucose and HbA1c levels in the target range determined by your health care provider. Things that help control blood glucose levels include:   Blood glucose monitoring.   Meal planning.   Physical activity.   Diabetes medicine.  Over time, maintaining lower blood glucose levels helps lessen symptoms. Sometimes, prescription pain medicine is needed. HOME CARE INSTRUCTIONS:  Do not smoke.  Keep your blood glucose level in the range that you and your health care provider have determined acceptable for you.  Keep your blood pressure level in the range that you and your health care provider have determined acceptable for you.  Eat a well-balanced diet.  Be active every day.  Check your feet every day. SEEK MEDICAL CARE IF:   You have burning, stabbing, or aching pain in the legs or feet.  You are unable to feel pressure or pain in your feet.  You develop problems with digestion such as:  Nausea.  Vomiting.  Bloating.  Constipation.  Diarrhea.  Abdominal pain.  You have difficulty with urination, such as:  Incontinence.  Retention.  You have palpitations.  You develop orthostatic hypotension. When you  stand up you may feel:  Dizzy.  Weak.  Faint.  You cannot attain and maintain an erection (in men).  You have vaginal dryness and problems with decreased sexual desire and arousal (in women).  You have severe pain in your thighs, legs, or buttocks.  You have unexplained weight loss. Document Released: 06/20/2001 Document Revised: 01/30/2013 Document Reviewed: 09/20/2012 [facility] Patient Information 2014 [place].  Type 1 Diabetes Mellitus,  Adult Type 1 diabetes mellitus, often simply referred to as diabetes, is a long-term (chronic) disease. It occurs when the islet cells in the pancreas that make insulin (a hormone) are destroyed and can no longer make insulin. Insulin is needed to move sugars from food into the tissue cells. The tissue cells use the sugars for energy. In people with type 1 diabetes, the sugars build up in the blood instead of going into the tissue cells. As a result, high blood sugar (hyperglycemia) develops. Without insulin, the body breaks down fat cells for the needed energy. This breakdown of fat cells produces acid chemicals (ketones), which increases the acid levels in the body. The effect of either high ketone or sugar (glucose) levels can be life-threatening.  Type 1 diabetes was also previously called juvenile diabetes. It most often occurs before the age of 14, but it can occur at any age. RISK FACTORS A person is predisposed to developing type 1 diabetes if someone in his or her family has the disease and is exposed to certain additional environmental triggers.  SYMPTOMS  Symptoms of type 1 diabetes may develop gradually over days to weeks or suddenly. The symptoms occur due to hyperglycemia. The symptoms can include:   Increased thirst (polydipsia).  Increased urination (polyuria).  Increased urination during the night (nocturia).  Weight loss. This weight loss may be rapid.  Frequent, recurring infections.  Tiredness (fatigue).  Weakness.  Vision changes, such as blurred vision.  Fruity smell to your breath.  Abdominal pain.  Nausea or vomiting. DIAGNOSIS  Type 1 diabetes is diagnosed when symptoms of diabetes are present and when blood glucose levels are increased. Your blood glucose level may be checked by one or more of the following blood tests:  A fasting blood glucose test. You will not be allowed to eat for at least 8 hours before a blood sample is taken.  A random blood glucose  test. Your blood glucose is checked at any time of the day regardless of when you ate.  A hemoglobin A1c blood glucose test. A hemoglobin A1c test provides information about blood glucose control over the previous 3 months. TREATMENT  Although type 1 diabetes cannot be prevented, it can be managed with insulin, diet, and exercise.  You will need to take insulin daily to keep blood glucose in the desired range.  You will need to match insulin dosing with exercise and healthy food choices. The treatment goal is to maintain the before-meal blood sugar (preprandial glucose) level at 70 130 mg/dL.  HOME CARE INSTRUCTIONS   Have your hemoglobin A1c level checked twice a year.  Perform daily blood glucose monitoring as directed by your caregiver.  Monitor urine ketones when you are ill and as directed by your caregiver.  Take your insulin as directed by your caregiver to maintain your blood glucose level in the desired range.  Never run out of insulin. It is needed every day.  Adjust insulin based on your intake of carbohydrates. Carbohydrates can raise blood glucose levels but need to be included in your  diet. Carbohydrates provide vitamins, minerals, and fiber, which are an essential part of a healthy diet. Carbohydrates are found in fruits, vegetables, whole grains, dairy products, legumes, and foods containing added sugars.    Eat healthy foods. Alternate 3 meals with 3 snacks.  Maintain a healthy weight.  Carry a medical alert card or wear your medical alert jewelry.  Carry a 15 gram carbohydrate snack with you at all times to treat low blood glucose (hypoglycemia). Some examples of 15 gram carbohydrate snacks include:  Glucose tablets, 3 or 4.   Glucose gel, 15 gram tube.  Raisins, 2 tablespoons (24 grams).  Jelly beans, 6.  Animal crackers, 8.  Fruit juice, regular soda, or low-fat milk, 4 ounces (120 mL).  Gummy treats, 9.    Recognize hypoglycemia. Hypoglycemia  occurs with blood glucose levels of 70 mg/dL and below. The risk for hypoglycemia increases when fasting or skipping meals, during or after intense exercise, and during sleep. Hypoglycemia symptoms can include:  Tremors or shakes.  Decreased ability to concentrate.  Sweating.  Increased heart rate.  Headache.  Dry mouth.  Hunger.  Irritability.  Anxiety.  Restless sleep.  Altered speech or coordination.  Confusion.  Treat hypoglycemia promptly. If you are alert and able to safely swallow, follow the 15:15 rule:  Take 15 20 grams of rapid-acting glucose or carbohydrate. Rapid-acting options include glucose gel, glucose tablets, or 4 ounces (120 mL) of fruit juice, regular soda, or low-fat milk.  Check your blood glucose level 15 minutes after taking the glucose.   Take 15 20 grams more of glucose if the repeat blood glucose level is still 70 mg/dL or below.  Eat a meal or snack within 1 hour once blood glucose levels return to normal.  Be alert to polyuria and polydipsia, which are early signs of hyperglycemia. An early awareness of hyperglycemia allows for prompt treatment. Treat hyperglycemia as directed by your caregiver.  Engage in at least 150 minutes of moderate-intensity physical activity a week, spread over at least 3 days of the week or as directed by your caregiver.  Adjust your insulin dosing and food intake as needed if you start a new exercise or sport.  Follow your sick day plan at any time you are unable to eat or drink as usual.   Avoid tobacco use.  Limit alcohol intake to no more than 1 drink per day for nonpregnant women and 2 drinks per day for men. You should drink alcohol only when you are also eating food. Talk with your caregiver about whether alcohol is safe for you. Tell your caregiver if you drink alcohol several times a week.  Follow up with your caregiver regularly.  Schedule an eye exam within 5 years of diagnosis and then  annually.  Perform daily skin and foot care. Examine your skin and feet daily for cuts, bruises, redness, nail problems, bleeding, blisters, or sores. A foot exam by a caregiver should be done annually.  Brush your teeth and gums at least twice a day and floss at least once a day. Follow up with your dentist regularly.  Share your diabetes management plan with your workplace or school.  Stay up-to-date with immunizations.  Learn to manage stress.  Obtain ongoing diabetes education and support as needed.  Participate or seek rehabilitation as needed to maintain or improve independence and quality of life. Request a physical or occupational therapy referral if you are having foot or hand numbness or difficulties with grooming, dressing, eating, or  physical activity. SEEK MEDICAL CARE IF:   You are unable to eat food or drink fluids for more than 6 hours.  You have nausea and vomiting for more than 6 hours.  Your blood glucose level is over 240 mg/dL.  There is a change in mental status.  You develop an additional serious illness.  You have diarrhea for more than 6 hours.  You have been sick or have had a fever for a couple of days and are not getting better.  You have pain during any physical activity. SEEK IMMEDIATE MEDICAL CARE IF:  You have difficulty breathing.  You have moderate to large ketone levels. MAKE SURE YOU:  Understand these instructions.  Will watch your condition.  Will get help right away if you are not doing well or get worse. Document Released: 04/08/2000 Document Revised: 01/04/2012 Document Reviewed: 11/08/2011 [facility] Patient Information 2014 [place].

## 2013-06-06 ENCOUNTER — Encounter: Payer: Self-pay | Admitting: Emergency Medicine

## 2013-06-06 ENCOUNTER — Ambulatory Visit (INDEPENDENT_AMBULATORY_CARE_PROVIDER_SITE_OTHER): Payer: Medicare Other | Admitting: Emergency Medicine

## 2013-06-06 VITALS — BP 136/60 | HR 62 | Temp 98.2°F | Resp 18 | Ht 64.0 in | Wt 148.0 lb

## 2013-06-06 DIAGNOSIS — R21 Rash and other nonspecific skin eruption: Secondary | ICD-10-CM

## 2013-06-06 MED ORDER — DEXAMETHASONE SODIUM PHOSPHATE 100 MG/10ML IJ SOLN
10.0000 mg | Freq: Once | INTRAMUSCULAR | Status: AC
  Administered 2013-06-06: 10 mg via INTRAMUSCULAR

## 2013-06-06 MED ORDER — PREDNISONE 10 MG PO TABS: ORAL_TABLET | ORAL | Status: DC

## 2013-06-06 NOTE — Progress Notes (Signed)
Subjective:    Patient ID: [name], female    DOB: 09-01-1929, 80 y.o.   MRN: [id number]  HPI Comments: 80 yo female with rash x 2 days. She notes rash started yesterday with itch in head and now all over her body. She has been using aveeno body wash x several months with out any problems. She denies relief with topical peroxide or Benadryl. She notes she started Vinegar and honey on Sunday.  She denies fever, ST, Cough, runny eyes. She had measles as a child. She denies any new exposure to measles.  Rash    Current Outpatient Prescriptions on File Prior to Visit  Medication Sig Dispense Refill  . ammonium lactate (AMLACTIN) 12 % lotion Apply to affected area daily  400 g  1  . aspirin 81 MG tablet Take 81 mg by mouth daily.      . bisoprolol-hydrochlorothiazide (ZIAC) 5-6.25 MG per tablet Take 1 tablet by mouth daily. For BP  90 tablet  99  . Blood Glucose Monitoring Suppl (ACCU-CHEK AVIVA PLUS) W/DEVICE KIT       . Cholecalciferol (VITAMIN D3) 2000 UNITS TABS Take 1 capsule by mouth 2 (two) times daily.      . fenofibrate micronized (LOFIBRA) 134 MG capsule Take 134 mg by mouth daily before breakfast.      . gabapentin (NEURONTIN) 300 MG capsule Take 300 mg by mouth. Bottle states patient can take TID-QID daily      . Insulin Glargine (LANTUS SOLOSTAR) 100 UNIT/ML Solostar Pen Inject 30 Units into the skin daily. or as directed to control blood sugars  45 mL  3  . insulin lispro (HUMALOG) 100 UNIT/ML injection Inject 5-10 Units into the skin. Patient takes Rx on a sliding scale basis      . loratadine (CLARITIN) 10 MG tablet Take 1 tablet (10 mg total) by mouth daily. For allergies or itching      . LORazepam (ATIVAN) 1 MG tablet       . losartan (COZAAR) 100 MG tablet Take 1 tablet (100 mg total) by mouth daily.      . Magnesium 400 MG CAPS Take 500 mg by mouth 2 (two) times daily.  500 capsule    . meclizine (ANTIVERT) 25 MG tablet Take 25 mg by mouth 3 (three) times daily as  needed for dizziness.      . meloxicam (MOBIC) 15 MG tablet 1/2 to 1 tablet daily with food for pain and inflammation      . metFORMIN (GLUCOPHAGE) 500 MG tablet 1/2 (500 mg ) to 1 tablet (1000 mg) twice daily with food for diabetes      . Multiple Vitamins-Minerals (HAIR/SKIN/NAILS) TABS Take by mouth 2 (two) times daily.      . Omega 3 1000 MG CAPS Take by mouth daily.      . pravastatin (PRAVACHOL) 40 MG tablet TAKE 1 TABLET BY MOUTH AT BEDTIME FOR CHOLESTEROL  90 tablet  2  . rOPINIRole (REQUIP) 1 MG tablet Take 1 or 2 tablets twice daily (up to 4 tablets daily) for Restless Legs      . traMADol (ULTRAM) 50 MG tablet Take 50 mg by mouth every 8 (eight) hours as needed for pain.      . vitamin B-12 (CYANOCOBALAMIN) 1000 MCG tablet Take 1,000 mcg by mouth 2 (two) times daily.      . vitamin C (ASCORBIC ACID) 500 MG tablet Take 500 mg by mouth daily.  No current facility-administered medications on file prior to visit.   Allergies  Allergen Reactions  . Morphine And Related Other (See Comments)    fuzzy feeling   Past Medical History  Diagnosis Date  . Liver lesion   . Chronic back pain   . Neuropathy   . Diabetic neuropathy   . Wears glasses   . Type II or unspecified type diabetes mellitus without mention of complication, not stated as uncontrolled   . Hypercholesteremia   . Hypertension   . Arthritis   . Vitamin D deficiency      Review of Systems  Skin: Positive for rash.  All other systems reviewed and are negative.   BP 136/60  Pulse 62  Temp(Src) 98.2 F (36.8 C) (Temporal)  Resp 18  Ht 5' 4"  (1.626 m)  Wt 148 lb (67.132 kg)  BMI 25.39 kg/m2     Objective:   Physical Exam  Nursing note and vitals reviewed. Constitutional: She is oriented to person, place, and time. She appears well-developed and well-nourished.  HENT:  Head: Normocephalic and atraumatic.  Right Ear: External ear normal.  Left Ear: External ear normal.  Nose: Nose normal.   Mouth/Throat: Oropharynx is clear and moist. No oropharyngeal exudate.  Eyes: Conjunctivae and EOM are normal.  Neck: Normal range of motion.  Cardiovascular: Normal rate, regular rhythm, normal heart sounds and intact distal pulses.   Pulmonary/Chest: Effort normal and breath sounds normal.  Musculoskeletal: Normal range of motion.  Lymphadenopathy:    She has no cervical adenopathy.  Neurological: She is alert and oriented to person, place, and time.  Skin: Skin is warm and dry. Rash noted.  Minimal erythema with splotchy 2-3 mm mildly elevated patches over sides of abdomen and back primarily, milder rash on arms  Psychiatric: She has a normal mood and affect. Judgment normal.          Assessment & Plan:  Rash ? Related to Honey. D/C Honey- Dexam 10 mg, continue Benadryl, Aveeno lotion, mild showers. PredDP 10 mg if no relief will start tomorrow. w/c if SX increase or ER.   I DO NOT FEEL SHE HAS THE MEASLES BUT MADE PATIENT AWARE OF WHAT TO MONITOR FOR.

## 2013-06-06 NOTE — Patient Instructions (Signed)
Measles, Adult Measles is an illness that is caused by a virus. The virus can spread from person to person (contagious) very easily. Measles is more common in children than adults, but adults can get it if they have not had a vaccine or have never had the illness before. CAUSES  Measles is caused by a virus called rubeola. SYMPTOMS   Fever.  White spots inside the mouth ([place] spots).  Red, runny eyes (conjunctivitis) that might be extra sensitive to bright light.  Sneezing, coughing, and sore throat.  A red rash that starts on the face and spreads to the body. Symptoms usually begin to appear 8 10 days after exposure to the virus. The rash is the last symptom to develop and lasts 3 5 days. In very rare cases, there is no rash. DIAGNOSIS  Your caregiver will perform a physical exam and may take some blood tests. Caregivers can often tell if a person has measles by doing an exam and learning about the symptoms. TREATMENT  There is no specific treatment or cure for measles. Treatment aims to relieve symptoms and prevent complications from happening. Measles goes away on its own, usually within 2 weeks of symptoms starting.  HOME CARE INSTRUCTIONS  Rest.   Drink enough fluids to keep urine clear or pale yellow.  Keep the lights low if bright lights bother you.  Keep a humidifier in your room, if possible, to help relieve your cough.   Only take over-the-counter or prescription medicines as directed by your caregiver.   Keep all follow-up appointments as directed by your caregiver.  Prevent measles from spreading by staying away from others until 4 days after the rash appears. Others may get measles by breathing in infected droplets or touching a surface where the droplets fell and then touching the mouth or nose. These droplets are released into the air when you talk, cough, or sneeze. They are contagious for 2 hours.  Be aware that measles cases are often reported to a public  health agency. You may be contacted by a public health department and asked questions about how you got infected. PREVENTION  Measles can be prevented with a vaccine. If you had measles before, you cannot get it again and do not need a vaccine. If you are exposed to measles and did not receive a vaccine or have not had measles before, you may be able to get a vaccine or an antibody shot within 6 days of exposure to prevent infection. SEEK IMMEDIATE MEDICAL CARE IF:  You have ear pain or a headache.  You are breathing rapidly or strangely.  You have chest pain.  You have shortness of breath.  You are confused.  You have a seizure.  You have nausea or vomiting.  Your measles symptoms do not go away in 2 weeks.  You have symptoms of another illness. MAKE SURE YOU:  Understand these instructions.  Will watch your condition.  Will get help right away if you are not doing well or get worse. Document Released: 01/04/2012 Document Reviewed: 01/04/2012 [facility] Patient Information 2014 [place], [place]. Rash A rash is a change in the color or feel of your skin. There are many different types of rashes. You may have other problems along with your rash. HOME CARE  Avoid the thing that caused your rash.  Do not scratch your rash.  You may take cools baths to help stop itching.  Only take medicines as told by your doctor.  Keep all doctor visits  as told. GET HELP RIGHT AWAY IF:   Your pain, puffiness (swelling), or redness gets worse.  You have a fever.  You have new or severe problems.  You have body aches, watery poop (diarrhea), or you throw up (vomit).  Your rash is not better after 3 days. MAKE SURE YOU:   Understand these instructions.  Will watch your condition.  Will get help right away if you are not doing well or get worse. Document Released: 09/28/2007 Document Revised: 07/04/2011 Document Reviewed: 01/24/2011 [facility] Patient Information 2014 [place],  [place].

## 2013-06-29 ENCOUNTER — Encounter (HOSPITAL_COMMUNITY): Payer: Self-pay | Admitting: Emergency Medicine

## 2013-06-29 DIAGNOSIS — Z8719 Personal history of other diseases of the digestive system: Secondary | ICD-10-CM | POA: Insufficient documentation

## 2013-06-29 DIAGNOSIS — Z79899 Other long term (current) drug therapy: Secondary | ICD-10-CM | POA: Insufficient documentation

## 2013-06-29 DIAGNOSIS — E7800 Pure hypercholesterolemia, unspecified: Secondary | ICD-10-CM | POA: Insufficient documentation

## 2013-06-29 DIAGNOSIS — E1149 Type 2 diabetes mellitus with other diabetic neurological complication: Secondary | ICD-10-CM | POA: Insufficient documentation

## 2013-06-29 DIAGNOSIS — G8929 Other chronic pain: Secondary | ICD-10-CM | POA: Insufficient documentation

## 2013-06-29 DIAGNOSIS — M129 Arthropathy, unspecified: Secondary | ICD-10-CM | POA: Insufficient documentation

## 2013-06-29 DIAGNOSIS — I10 Essential (primary) hypertension: Secondary | ICD-10-CM | POA: Insufficient documentation

## 2013-06-29 DIAGNOSIS — E1142 Type 2 diabetes mellitus with diabetic polyneuropathy: Secondary | ICD-10-CM | POA: Insufficient documentation

## 2013-06-29 DIAGNOSIS — Z794 Long term (current) use of insulin: Secondary | ICD-10-CM | POA: Insufficient documentation

## 2013-06-29 DIAGNOSIS — Z7982 Long term (current) use of aspirin: Secondary | ICD-10-CM | POA: Insufficient documentation

## 2013-06-29 DIAGNOSIS — IMO0002 Reserved for concepts with insufficient information to code with codable children: Secondary | ICD-10-CM | POA: Insufficient documentation

## 2013-06-29 NOTE — ED Notes (Addendum)
Patient said she is having pain in her right leg, radiating to her groin and it goes down her leg.  The patient can walk but it hurts to walk.  She had this same pain but only on the side of her leg at the beginning of March and she saw Dr. Rhona Raider and Orthopedic MD.  He diagnosed her with bursitis and gave her an injection.  She could not advise what the medication.  She thinks it may have been a steroid.  She started having the pain again and called Dr. Rhona Raider they said the office was closed and she needed to go to Urgent Care.Dr. Erin Sons office called in a prescription and she took one of the pills and it did not work. Urgent Care advised her they could not give her any injections to the site and sent her here.  She says she cannot stand the pain any longer and needs to be treated.  She rates her pain 9/10.

## 2013-06-30 ENCOUNTER — Emergency Department (HOSPITAL_COMMUNITY)
Admission: EM | Admit: 2013-06-30 | Discharge: 2013-06-30 | Disposition: A | Discharge status: Home / Self Care | Payer: Medicare Other | Attending: Emergency Medicine | Admitting: Emergency Medicine

## 2013-06-30 DIAGNOSIS — M5410 Radiculopathy, site unspecified: Secondary | ICD-10-CM

## 2013-06-30 MED ORDER — METHOCARBAMOL 500 MG PO TABS
750.0000 mg | ORAL_TABLET | Freq: Once | ORAL | Status: AC
  Administered 2013-06-30: 750 mg via ORAL
  Filled 2013-06-30: qty 1.5

## 2013-06-30 MED ORDER — OXYCODONE-ACETAMINOPHEN 5-325 MG PO TABS: 2.0000 | ORAL_TABLET | ORAL | Status: DC | PRN

## 2013-06-30 MED ORDER — GABAPENTIN 300 MG PO CAPS: 300.0000 mg | ORAL_CAPSULE | Freq: Four times a day (QID) | ORAL | Status: DC

## 2013-06-30 MED ORDER — METHOCARBAMOL 750 MG PO TABS: 750.0000 mg | ORAL_TABLET | Freq: Four times a day (QID) | ORAL | Status: DC

## 2013-06-30 MED ORDER — OXYCODONE-ACETAMINOPHEN 5-325 MG PO TABS
2.0000 | ORAL_TABLET | Freq: Once | ORAL | Status: AC
  Administered 2013-06-30: 2 via ORAL
  Filled 2013-06-30: qty 2

## 2013-06-30 NOTE — ED Provider Notes (Signed)
CSN: [id number]     Arrival date & time 06/29/13  2145 History   First MD Initiated Contact with Patient 06/30/13 0112     Chief Complaint  Patient presents with  . Leg Pain     (Consider location/radiation/quality/duration/timing/severity/associated sxs/prior Treatment) HPI 80 year old female presents to emergency room from home with complaint of right leg pain.  Pain has been ongoing for the last 2 days.  She reports that initially she had pain only on the outer aspect of her right hip, seen by orthopedics, and told she had bursitis.  She was given a steroid injection, which made symptoms better.  Over the last 2 days, pain, has radiated from her hip, down to her toes.  Pain is constant gnawing, burning, stabbing pain.  Patient contacted her orthopedist office today, but was unable to be seen.  She was prescribed Naprosyn, which she has taken once, and reports has not helped with her pain.  Patient has history of chronic back pain, status post fusion.  She denies previous history of sciatica or radicular type pain.  She denies any weakness or numbness to the leg.  No bowel or bladder incontinence.  No trauma or change in activity.  Patient is prescribed Ultram, but has not taken it for her pain as she was unsure if they can be mixed with other medications.  She reports that she is taking Neurontin for peripheral neuropathy, takes one pill at dinnertime, and 2-3 tabs before bed.  She also takes Mobic for osteoarthritis, and Requip for restless legs. Past Medical History  Diagnosis Date  . Liver lesion   . Chronic back pain   . Neuropathy   . Diabetic neuropathy   . Wears glasses   . Type II or unspecified type diabetes mellitus without mention of complication, not stated as uncontrolled   . Hypercholesteremia   . Hypertension   . Arthritis   . Vitamin D deficiency    Past Surgical History  Procedure Laterality Date  . Abdominal hysterectomy    . Cesarean section      x 3   . Back  surgery  1993    lumb lam-fusion  . Appendectomy    . Colonoscopy    . Eye surgery      both cataracts  . Trigger finger release  01/10/2012    Procedure: RELEASE TRIGGER FINGER/A-1 PULLEY;  Surgeon: Hessie Dibble, MD;  Location: [facility];  Service: Orthopedics;  Laterality: Right;  right ring trigger release   Family History  Problem Relation Age of Onset  . Colon cancer Neg Hx   . Liver disease Sister   . Diabetes Sister    History  Substance Use Topics  . Smoking status: Never Smoker   . Smokeless tobacco: Never Used  . Alcohol Use: No   OB History   Grav Para Term Preterm Abortions TAB SAB Ect Mult Living                 Review of Systems   See History of Present Illness; otherwise all other systems are reviewed and negative  Allergies  Morphine and related  Home Medications   Current Outpatient Rx  Name  Route  Sig  Dispense  Refill  . ammonium lactate (AMLACTIN) 12 % lotion      Apply to affected area daily   400 g   1   . aspirin 81 MG tablet   Oral   Take 81 mg by mouth daily.         Marland Kitchen  bisoprolol-hydrochlorothiazide (ZIAC) 5-6.25 MG per tablet   Oral   Take 1 tablet by mouth daily. For BP   90 tablet   99   . Blood Glucose Monitoring Suppl (ACCU-CHEK AVIVA PLUS) W/DEVICE KIT               . Cholecalciferol (VITAMIN D3) 2000 UNITS TABS   Oral   Take 1 capsule by mouth 2 (two) times daily.         . fenofibrate micronized (LOFIBRA) 134 MG capsule   Oral   Take 134 mg by mouth daily before breakfast.         . gabapentin (NEURONTIN) 300 MG capsule   Oral   Take 1 capsule (300 mg total) by mouth 4 (four) times daily. Bottle states patient can take TID-QID daily   30 capsule   0   . Insulin Glargine (LANTUS SOLOSTAR) 100 UNIT/ML Solostar Pen   Subcutaneous   Inject 30 Units into the skin daily. or as directed to control blood sugars   45 mL   3     CYCLE FILL MEDICATION. Authorization is required f ...   .  insulin lispro (HUMALOG) 100 UNIT/ML injection   Subcutaneous   Inject 5-10 Units into the skin. Patient takes Rx on a sliding scale basis         . loratadine (CLARITIN) 10 MG tablet   Oral   Take 1 tablet (10 mg total) by mouth daily. For allergies or itching         . LORazepam (ATIVAN) 1 MG tablet               . losartan (COZAAR) 100 MG tablet   Oral   Take 1 tablet (100 mg total) by mouth daily.         . Magnesium 400 MG CAPS   Oral   Take 500 mg by mouth 2 (two) times daily.   500 capsule      . meclizine (ANTIVERT) 25 MG tablet   Oral   Take 25 mg by mouth 3 (three) times daily as needed for dizziness.         . meloxicam (MOBIC) 15 MG tablet      1/2 to 1 tablet daily with food for pain and inflammation         . metFORMIN (GLUCOPHAGE) 500 MG tablet      1/2 (500 mg ) to 1 tablet (1000 mg) twice daily with food for diabetes         . methocarbamol (ROBAXIN-750) 750 MG tablet   Oral   Take 1 tablet (750 mg total) by mouth 4 (four) times daily.   30 tablet   0   . Multiple Vitamins-Minerals (HAIR/SKIN/NAILS) TABS   Oral   Take by mouth 2 (two) times daily.         . Omega 3 1000 MG CAPS   Oral   Take by mouth daily.         . oxyCODONE-acetaminophen (PERCOCET/ROXICET) 5-325 MG per tablet   Oral   Take 2 tablets by mouth every 4 (four) hours as needed for severe pain.   20 tablet   0   . pravastatin (PRAVACHOL) 40 MG tablet      TAKE 1 TABLET BY MOUTH AT BEDTIME FOR CHOLESTEROL   90 tablet   2     CYCLE FILL MEDICATION. Authorization is required f ...   . predniSONE (DELTASONE) 10 MG tablet        1 po TID x 3 days, 1 PO BID x 3 days, 1 po QD x 5 days   20 tablet   0   . rOPINIRole (REQUIP) 1 MG tablet      Take 1 or 2 tablets twice daily (up to 4 tablets daily) for Restless Legs         . traMADol (ULTRAM) 50 MG tablet   Oral   Take 50 mg by mouth every 8 (eight) hours as needed for pain.         . vitamin B-12  (CYANOCOBALAMIN) 1000 MCG tablet   Oral   Take 1,000 mcg by mouth 2 (two) times daily.         . vitamin C (ASCORBIC ACID) 500 MG tablet   Oral   Take 500 mg by mouth daily.          BP 161/49  Pulse 60  Temp(Src) 98.2 F (36.8 C) (Oral)  Resp 16  Ht 5' 5" (1.651 m)  Wt 150 lb 5 oz (68.181 kg)  BMI 25.01 kg/m2  SpO2 98% Physical Exam  Nursing note and vitals reviewed. Constitutional: She is oriented to person, place, and time. She appears well-developed and well-nourished. She appears distressed.  HENT:  Head: Normocephalic and atraumatic.  Nose: Nose normal.  Mouth/Throat: Oropharynx is clear and moist.  Eyes: Conjunctivae and EOM are normal. Pupils are equal, round, and reactive to light.  Neck: Normal range of motion. Neck supple. No JVD present. No tracheal deviation present. No thyromegaly present.  Cardiovascular: Normal rate, regular rhythm, normal heart sounds and intact distal pulses.  Exam reveals no gallop and no friction rub.   No murmur heard. Pulmonary/Chest: Effort normal and breath sounds normal. No stridor. No respiratory distress. She has no wheezes. She has no rales. She exhibits no tenderness.  Abdominal: Soft. Bowel sounds are normal. She exhibits no distension and no mass. There is no tenderness. There is no rebound and no guarding.  Musculoskeletal: Normal range of motion. She exhibits tenderness. She exhibits no edema.  Patient is tender over her right SI joint, right buttock, right lateral thigh, right calf.  He has normal range of motion.  She reports pain is worse with external rotation of the hip.  There is no crepitus.  There is no overlying skin changes.  She has normal sensation.  Pulses are normal.  Lymphadenopathy:    She has no cervical adenopathy.  Neurological: She is alert and oriented to person, place, and time. She has normal reflexes. No cranial nerve deficit. She exhibits normal muscle tone. Coordination normal.  Skin: Skin is warm and  dry. No rash noted. No erythema. No pallor.  Psychiatric: She has a normal mood and affect. Her behavior is normal. Judgment and thought content normal.    ED Course  Procedures (including critical care time) Labs Review Labs Reviewed - No data to display Imaging Review No results found.   EKG Interpretation None      MDM   Final diagnoses:  Radiculopathy of leg    80 year old female with radiculopathy of the right leg.  We'll give a short course of Percocet, and Robaxin to: With her Naprosyn.  Prior charts reviewed, and patient may have had a unresponsive episode in the past with overuse of oxycodone, so be judicious in her prescribing.  She has been advised to follow back up with her orthopedist.    Kalman Drape, MD 06/30/13 [phone]

## 2013-06-30 NOTE — ED Notes (Signed)
Patient verbalized frustration with waiting time. Dr. Sharol Given made aware.

## 2013-06-30 NOTE — ED Notes (Signed)
Pt is being discharged with husband.

## 2013-06-30 NOTE — Discharge Instructions (Signed)
Take medications as instructed.  Follow up with your orthopedist as soon as possible. Return to the ER for worsening condition or new concerning symptoms.   Radicular Pain Radicular pain in either the arm or leg is usually from a bulging or herniated disk in the spine. A piece of the herniated disk may press against the nerves as the nerves exit the spine. This causes pain which is felt at the tips of the nerves down the arm or leg. Other causes of radicular pain may include:  Fractures.  Heart disease.  Cancer.  An abnormal and usually degenerative state of the nervous system or nerves (neuropathy). Diagnosis may require CT or MRI scanning to determine the primary cause.  Nerves that start at the neck (nerve roots) may cause radicular pain in the outer shoulder and arm. It can spread down to the thumb and fingers. The symptoms vary depending on which nerve root has been affected. In most cases radicular pain improves with conservative treatment. Neck problems may require physical therapy, a neck collar, or cervical traction. Treatment may take many weeks, and surgery may be considered if the symptoms do not improve.  Conservative treatment is also recommended for sciatica. Sciatica causes pain to radiate from the lower back or buttock area down the leg into the foot. Often there is a history of back problems. Most patients with sciatica are better after 2 to 4 weeks of rest and other supportive care. Short term bed rest can reduce the disk pressure considerably. Sitting, however, is not a good position since this increases the pressure on the disk. You should avoid bending, lifting, and all other activities which make the problem worse. Traction can be used in severe cases. Surgery is usually reserved for patients who do not improve within the first months of treatment. Only take over-the-counter or prescription medicines for pain, discomfort, or fever as directed by your caregiver. Narcotics and  muscle relaxants may help by relieving more severe pain and spasm and by providing mild sedation. Cold or massage can give significant relief. Spinal manipulation is not recommended. It can increase the degree of disc protrusion. Epidural steroid injections are often effective treatment for radicular pain. These injections deliver medicine to the spinal nerve in the space between the protective covering of the spinal cord and back bones (vertebrae). Your caregiver can give you more information about steroid injections. These injections are most effective when given within two weeks of the onset of pain.  You should see your caregiver for follow up care as recommended. A program for neck and back injury rehabilitation with stretching and strengthening exercises is an important part of management.  SEEK IMMEDIATE MEDICAL CARE IF:  You develop increased pain, weakness, or numbness in your arm or leg.  You develop difficulty with bladder or bowel control.  You develop abdominal pain. Document Released: 05/19/2004 Document Revised: 07/04/2011 Document Reviewed: 08/04/2008 [facility] Patient Information 2014 [place], [place].

## 2013-07-02 ENCOUNTER — Other Ambulatory Visit: Payer: Self-pay | Admitting: Internal Medicine

## 2013-07-19 ENCOUNTER — Ambulatory Visit: Payer: Self-pay | Admitting: Internal Medicine

## 2013-08-06 ENCOUNTER — Encounter: Payer: Self-pay | Admitting: Internal Medicine

## 2013-08-06 ENCOUNTER — Ambulatory Visit (INDEPENDENT_AMBULATORY_CARE_PROVIDER_SITE_OTHER): Payer: Medicare Other | Admitting: Internal Medicine

## 2013-08-06 VITALS — BP 154/66 | HR 60 | Temp 97.9°F | Resp 16 | Ht 64.0 in | Wt 143.2 lb

## 2013-08-06 DIAGNOSIS — D485 Neoplasm of uncertain behavior of skin: Secondary | ICD-10-CM

## 2013-08-06 DIAGNOSIS — E782 Mixed hyperlipidemia: Secondary | ICD-10-CM

## 2013-08-06 DIAGNOSIS — Z79899 Other long term (current) drug therapy: Secondary | ICD-10-CM | POA: Insufficient documentation

## 2013-08-06 DIAGNOSIS — E119 Type 2 diabetes mellitus without complications: Secondary | ICD-10-CM

## 2013-08-06 DIAGNOSIS — I10 Essential (primary) hypertension: Secondary | ICD-10-CM

## 2013-08-06 DIAGNOSIS — E559 Vitamin D deficiency, unspecified: Secondary | ICD-10-CM

## 2013-08-06 LAB — CBC WITH DIFFERENTIAL/PLATELET
Basophils Absolute: 0.1 10*3/uL (ref 0.0–0.1)
Basophils Relative: 1 % (ref 0–1)
Eosinophils Absolute: 0.3 10*3/uL (ref 0.0–0.7)
Eosinophils Relative: 4 % (ref 0–5)
HCT: 36.9 % (ref 36.0–46.0)
Hemoglobin: 12.3 g/dL (ref 12.0–15.0)
Lymphocytes Relative: 35 % (ref 12–46)
Lymphs Abs: 2.6 10*3/uL (ref 0.7–4.0)
MCH: 31.1 pg (ref 26.0–34.0)
MCHC: 33.3 g/dL (ref 30.0–36.0)
MCV: 93.4 fL (ref 78.0–100.0)
Monocytes Absolute: 0.7 10*3/uL (ref 0.1–1.0)
Monocytes Relative: 10 % (ref 3–12)
Neutro Abs: 3.7 10*3/uL (ref 1.7–7.7)
Neutrophils Relative %: 50 % (ref 43–77)
Platelets: 300 10*3/uL (ref 150–400)
RBC: 3.95 MIL/uL (ref 3.87–5.11)
RDW: 13.2 % (ref 11.5–15.5)
WBC: 7.4 10*3/uL (ref 4.0–10.5)

## 2013-08-06 NOTE — Patient Instructions (Signed)

## 2013-08-06 NOTE — Progress Notes (Signed)
Patient ID: [name], female   DOB: 27-Oct-1929, 80 y.o.   MRN: [id number]    This very nice 80 y.o. MWF presents for 3 month follow up with Hypertension, Hyperlipidemia, Pre-Diabetes and Vitamin D Deficiency.    HTN predates since 2000. BP has been controlled at home. Today's BP: 154/66 mmHg .  Patient had neg Cardiolyte in 2009.Patient denies any cardiac type chest pain, palpitations, dyspnea/orthopnea/PND, dizziness, claudication, or dependent edema.   Hyperlipidemia is controlled with diet & meds. Last Cholesterol was 176, Triglycerides were 135, HDL  58 and LDL 91 in Dec 2104 - at goal.  Patient denies myalgias or other med SE's.    Also, the patient has history of T1 IDDM since 1996 w/Stage 3 CKD (GFR 42 ml/min) and with last A1c of  8.9% in Dec 2014. Currently she's taking Lantus 30 u qhs and Humalog ~ 18 units by SS qam wit recent CBG's in the range of 200 +/- mg%.  Patient denies any symptoms of reactive hypoglycemia, diabetic polys, paresthesias or visual blurring.   Further, Patient has history of Vitamin D Deficiency with last vitamin D of   . Patient supplements vitamin D without any suspected side-effects.  Medication Sig  . ammonium lactate (AMLACTIN) 12 % lotion Apply to affected area daily  . aspirin 81 MG tablet Take 81 mg by mouth daily.  . bisoprolol-hydrochlorothiazide [facility]) 5-6.25  Take 1 tablet by mouth daily. For BP  . Glucose Monitoring Suppl (ACCU-CHEK AVIVA PLUS)    . Cholecalciferol (VITAMIN D3) 2000 UNITS TABS Take 1 capsule by mouth 2 (two) times daily.  . fenofibrate micronized (LOFIBRA) 134 MG capsule Take 134 mg by mouth daily before breakfast.  . gabapentin (NEURONTIN) 300 MG capsule Take 1 capsule (300 mg total) by mouth 4 (four) times daily  .  (LANTUS SOLOSTAR) 100 UNIT/ML Solostar Pen Inject 30 Units into the skin daily. or as directed to control blood sugars  . insulin lispro (HUMALOG) 100 UNIT/ML injection Inject 5-10 Units into the skin. Patient  takes Rx on a sliding scale basis  . LORazepam (ATIVAN) 1 MG tablet   . Magnesium 400 MG CAPS Take 500 mg by mouth 2 (two) times daily.  . meclizine (ANTIVERT) 25 MG tablet Take 25 mg by mouth 3 (three) times daily as needed for dizziness.  . meloxicam (MOBIC) 15 MG tablet 1/2 to 1 tablet daily with food for pain and inflammation  . metFORMIN (GLUCOPHAGE) 500 MG tablet 1/2 (500 mg ) to 1 tablet (1000 mg) twice daily with food for diabetes  . Multiple Vitamins-Minerals (HAIR/SKIN/NAILS) TABS Take by mouth 2 (two) times daily.  . Omega 3 1000 MG CAPS Take by mouth daily.  . pravastatin (PRAVACHOL) 40 MG tablet TAKE 1 TABLET BY MOUTH AT BEDTIME FOR CHOLESTEROL  . rOPINIRole (REQUIP) 1 MG tablet Take 1 or 2 tablets twice daily (up to 4 tablets daily) for Restless Legs  . traMADol (ULTRAM) 50 MG tablet Take 50 mg by mouth every 8 (eight) hours as needed for pain.  . vitamin B-12 (CYANOCOBALAMIN) 1000 MCG tablet Take 1,000 mcg by mouth 2 (two) times daily.  . vitamin C (ASCORBIC ACID) 500 MG tablet Take 500 mg by mouth daily.   Allergies  Allergen Reactions  . Morphine And Related Other (See Comments)    fuzzy feeling   PMHx:   Past Medical History  Diagnosis Date  . Liver lesion   . Chronic back pain   . Wears glasses   .  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 Hypercholesteremia   . Hypertension   . Arthritis   . Vitamin D deficiency    FHx:    Reviewed / unchanged  SHx:    Reviewed / unchanged   Systems Review: Constitutional: Denies fever, chills, wt changes, headaches, insomnia, fatigue, night sweats, change in appetite. Eyes: Denies redness, blurred vision, diplopia, discharge, itchy, watery eyes.  ENT: Denies discharge, congestion, post nasal drip, epistaxis, sore throat, earache, hearing loss, dental pain, tinnitus, vertigo, sinus pain, snoring.  CV: Denies chest pain, palpitations, irregular heartbeat, syncope, dyspnea, diaphoresis, orthopnea, PND, claudication,  edema. Respiratory: denies cough, dyspnea, DOE, pleurisy, hoarseness, laryngitis, wheezing.  Gastrointestinal: Denies dysphagia, odynophagia, heartburn, reflux, water brash, abdominal pain or cramps, nausea, vomiting, bloating, diarrhea, constipation, hematemesis, melena, hematochezia,  or hemorrhoids. Genitourinary: Denies dysuria, frequency, urgency, nocturia, hesitancy, discharge, hematuria, flank pain. Musculoskeletal: Denies arthralgias, myalgias, stiffness, jt. swelling, pain, limp, strain/sprain.  Skin: Denies pruritus, rash, hives, warts, acne, eczema, change in skin lesion(s). Neuro: No weakness, tremor, incoordination, spasms, paresthesia, or pain. Psychiatric: Denies confusion, memory loss, or sensory loss. Endo: Denies change in weight, skin, hair change.  Heme/Lymph: No excessive bleeding, bruising, orenlarged lymph nodes.   Exam:    BP 154/66  Pulse 60  Temp 97.9 F   Resp 16  Ht 5\' 4"    Wt 143 lb 3.2 oz   BMI 24.57 kg/m2  Appears well nourished - in no distress. Eyes: PERRLA, EOMs, conjunctiva no swelling or erythema. Sinuses: No frontal/maxillary tenderness ENT/Mouth: EAC's clear, TM's nl w/o erythema, bulging. Nares clear w/o erythema, swelling, exudates. Oropharynx clear without erythema or exudates. Oral hygiene is good. Tongue normal, non obstructing. Hearing intact.  Neck: Supple. Thyroid nl. Car 2+/2+ without bruits, nodes or JVD. Chest: Respirations nl with BS clear & equal w/o rales, rhonchi, wheezing or stridor.  Cor: Heart sounds normal w/ regular rate and rhythm without sig. murmurs, gallops, clicks, or rubs. Peripheral pulses normal and equal  without edema.   Musculoskeletal: Full ROM all peripheral extremities, joint stability, 5/5 strength, and normal gait.  Skin: Warm, dry without exposed rashes, lesions, ecchymosis apparent.  Neuro: Cranial nerves intact, reflexes equal bilaterally. Sensory-motor testing grossly intact. Tendon reflexes grossly intact.   Pysch: Alert & oriented x 3. Insight and judgement nl & appropriate. No ideations.  Assessment and Plan:  1. Hypertension - Continue monitor blood pressure at home. Continue diet/meds same.  2. Hyperlipidemia - Continue diet/meds, exercise,& lifestyle modifications. Continue monitor periodic cholesterol/liver & renal functions   3. T1 IDDM w/Stage III CKD - continue recommend prudent low glycemic diet, weight control, regular exercise, diabetic monitoring and periodic eye exams.  4. Vitamin D Deficiency - Continue supplementation.  Recommended regular exercise, BP monitoring, weight control, and discussed med and SE's. Recommended labs to assess and monitor clinical status. Further disposition pending results of labs.  ++++++++++++++++++++++++++++++++++++++++++++++++++++++++++++++++++++++++++++++++++++++   Patient also presents for evaluation of two irritated brown crusty  10 x 12 mm lesions of the mid back and Left flank.   Procedure Details   The risks, benefits, indications, potential complications were explained to the patient.  With informed consent the areas were treated with liquid nitrogen by triple freeze/thaw technique.  Areas wer covered with Bacitracin Ung and sterile Dsg.s  Diagnosis: Skin lesions unknown significance - suspect irritated seborrheic keratoses  Procedure code: A999333 / 0000000  Complications:  None  Plan: 1. Instructed to keep the wound dry and covered for 5-7 days 2.  Warning signs of infection were reviewed & pt advised to call if any questions/problems.    3. Recommended that the patient use minor analgesics as needed for pain.

## 2013-08-07 LAB — HEMOGLOBIN A1C
Hgb A1c MFr Bld: 10.2 % — ABNORMAL HIGH (ref <5.7)
Mean Plasma Glucose: 246 mg/dL — ABNORMAL HIGH (ref <117)

## 2013-08-07 LAB — LIPID PANEL
Cholesterol: 166 mg/dL (ref 0–200)
HDL: 53 mg/dL (ref >39)
LDL Cholesterol: 69 mg/dL (ref 0–99)
Total CHOL/HDL Ratio: 3.1 Ratio
Triglycerides: 222 mg/dL — ABNORMAL HIGH (ref <150)
VLDL: 44 mg/dL — ABNORMAL HIGH (ref 0–40)

## 2013-08-07 LAB — HEPATIC FUNCTION PANEL
ALT: 20 U/L (ref 0–35)
AST: 23 U/L (ref 0–37)
Albumin: 4.2 g/dL (ref 3.5–5.2)
Alkaline Phosphatase: 57 U/L (ref 39–117)
Bilirubin, Direct: 0.1 mg/dL (ref 0.0–0.3)
Indirect Bilirubin: 0.2 mg/dL (ref 0.2–1.2)
Total Bilirubin: 0.3 mg/dL (ref 0.2–1.2)
Total Protein: 7.1 g/dL (ref 6.0–8.3)

## 2013-08-07 LAB — BASIC METABOLIC PANEL WITH GFR
BUN: 25 mg/dL — ABNORMAL HIGH (ref 6–23)
CO2: 29 mEq/L (ref 19–32)
Calcium: 10.2 mg/dL (ref 8.4–10.5)
Chloride: 100 mEq/L (ref 96–112)
Creat: 1.5 mg/dL — ABNORMAL HIGH (ref 0.50–1.10)
GFR, Est African American: 37 mL/min — ABNORMAL LOW
GFR, Est Non African American: 32 mL/min — ABNORMAL LOW
Glucose, Bld: 321 mg/dL — ABNORMAL HIGH (ref 70–99)
Potassium: 5.1 mEq/L (ref 3.5–5.3)
Sodium: 139 mEq/L (ref 135–145)

## 2013-08-07 LAB — VITAMIN D 25 HYDROXY (VIT D DEFICIENCY, FRACTURES): Vit D, 25-Hydroxy: 55 ng/mL (ref 30–89)

## 2013-08-07 LAB — MAGNESIUM: Magnesium: 2.2 mg/dL (ref 1.5–2.5)

## 2013-08-07 LAB — TSH: TSH: 1.541 u[IU]/mL (ref 0.350–4.500)

## 2013-08-12 ENCOUNTER — Other Ambulatory Visit: Payer: Self-pay | Admitting: Neurosurgery

## 2013-08-12 DIAGNOSIS — M47816 Spondylosis without myelopathy or radiculopathy, lumbar region: Secondary | ICD-10-CM

## 2013-08-13 ENCOUNTER — Ambulatory Visit
Admission: RE | Admit: 2013-08-13 | Discharge: 2013-08-13 | Disposition: A | Discharge status: Home / Self Care | Payer: Medicare Other | Source: Ambulatory Visit | Attending: Neurosurgery | Admitting: Neurosurgery

## 2013-08-13 VITALS — BP 147/60 | HR 45

## 2013-08-13 DIAGNOSIS — G8929 Other chronic pain: Secondary | ICD-10-CM

## 2013-08-13 DIAGNOSIS — M47816 Spondylosis without myelopathy or radiculopathy, lumbar region: Secondary | ICD-10-CM

## 2013-08-13 MED ORDER — METHYLPREDNISOLONE ACETATE 40 MG/ML INJ SUSP (RADIOLOG
120.0000 mg | Freq: Once | INTRAMUSCULAR | Status: AC
  Administered 2013-08-13: 120 mg via EPIDURAL

## 2013-08-13 MED ORDER — IOHEXOL 180 MG/ML  SOLN
1.0000 mL | Freq: Once | INTRAMUSCULAR | Status: AC | PRN
  Administered 2013-08-13: 1 mL via EPIDURAL

## 2013-08-13 NOTE — Discharge Instructions (Signed)

## 2013-08-16 ENCOUNTER — Other Ambulatory Visit: Payer: Self-pay | Admitting: Internal Medicine

## 2013-08-19 ENCOUNTER — Other Ambulatory Visit: Payer: Self-pay | Admitting: Internal Medicine

## 2013-08-31 ENCOUNTER — Other Ambulatory Visit: Payer: Self-pay | Admitting: Internal Medicine

## 2013-10-14 ENCOUNTER — Other Ambulatory Visit: Payer: Self-pay | Admitting: Internal Medicine

## 2013-10-16 ENCOUNTER — Encounter: Payer: Self-pay | Admitting: Emergency Medicine

## 2013-10-16 ENCOUNTER — Ambulatory Visit (INDEPENDENT_AMBULATORY_CARE_PROVIDER_SITE_OTHER): Payer: Medicare Other | Admitting: Emergency Medicine

## 2013-10-16 VITALS — BP 144/68 | HR 56 | Temp 98.4°F | Resp 18 | Ht 64.0 in | Wt 143.0 lb

## 2013-10-16 DIAGNOSIS — IMO0001 Reserved for inherently not codable concepts without codable children: Secondary | ICD-10-CM

## 2013-10-16 DIAGNOSIS — L03818 Cellulitis of other sites: Secondary | ICD-10-CM

## 2013-10-16 DIAGNOSIS — L02818 Cutaneous abscess of other sites: Secondary | ICD-10-CM

## 2013-10-16 DIAGNOSIS — L03811 Cellulitis of head [any part, except face]: Secondary | ICD-10-CM

## 2013-10-16 LAB — CBC WITH DIFFERENTIAL/PLATELET
Basophils Absolute: 0 10*3/uL (ref 0.0–0.1)
Basophils Relative: 0 % (ref 0–1)
Eosinophils Absolute: 0.9 10*3/uL — ABNORMAL HIGH (ref 0.0–0.7)
Eosinophils Relative: 9 % — ABNORMAL HIGH (ref 0–5)
HCT: 36.1 % (ref 36.0–46.0)
Hemoglobin: 12 g/dL (ref 12.0–15.0)
Lymphocytes Relative: 26 % (ref 12–46)
Lymphs Abs: 2.5 10*3/uL (ref 0.7–4.0)
MCH: 31.1 pg (ref 26.0–34.0)
MCHC: 33.2 g/dL (ref 30.0–36.0)
MCV: 93.5 fL (ref 78.0–100.0)
Monocytes Absolute: 1 10*3/uL (ref 0.1–1.0)
Monocytes Relative: 10 % (ref 3–12)
Neutro Abs: 5.3 10*3/uL (ref 1.7–7.7)
Neutrophils Relative %: 55 % (ref 43–77)
Platelets: 305 10*3/uL (ref 150–400)
RBC: 3.86 MIL/uL — ABNORMAL LOW (ref 3.87–5.11)
RDW: 12.9 % (ref 11.5–15.5)
WBC: 9.6 10*3/uL (ref 4.0–10.5)

## 2013-10-16 MED ORDER — DEXAMETHASONE SODIUM PHOSPHATE 100 MG/10ML IJ SOLN
10.0000 mg | Freq: Once | INTRAMUSCULAR | Status: AC
  Administered 2013-10-16: 10 mg via INTRAMUSCULAR

## 2013-10-16 MED ORDER — CEFTRIAXONE SODIUM 1 G IJ SOLR
1.0000 g | Freq: Once | INTRAMUSCULAR | Status: AC
  Administered 2013-10-16: 1 g via INTRAMUSCULAR

## 2013-10-16 MED ORDER — DOXYCYCLINE HYCLATE 100 MG PO TABS: 100.0000 mg | ORAL_TABLET | Freq: Two times a day (BID) | ORAL | Status: DC

## 2013-10-16 NOTE — Patient Instructions (Signed)
Cellulitis If any increase in symptoms ER. Cellulitis is an infection of the skin and the tissue under the skin. The infected area is usually red and tender. This happens most often in the arms and lower legs. HOME CARE   Take your antibiotic medicine as told. Finish the medicine even if you start to feel better.  Keep the infected arm or leg raised (elevated).  Put a warm cloth on the area up to 4 times per day.  Only take medicines as told by your doctor.  Keep all doctor visits as told. GET HELP RIGHT AWAY IF:   You have a fever.  You feel very sleepy.  You throw up (vomit) or have watery poop (diarrhea).  You feel sick and have muscle aches and pains.  You see red streaks on the skin coming from the infected area.  Your red area gets bigger or turns a dark color.  Your bone or joint under the infected area is painful after the skin heals.  Your infection comes back in the same area or different area.  You have a puffy (swollen) bump in the infected area.  You have new symptoms. MAKE SURE YOU:   Understand these instructions.  Will watch your condition.  Will get help right away if you are not doing well or get worse. Document Released: 09/28/2007 Document Revised: 10/11/2011 Document Reviewed: 06/27/2011 [facility] Patient Information 2015 [place], [place]. This information is not intended to replace advice given to you by your health care provider. Make sure you discuss any questions you have with your health care provider.

## 2013-10-16 NOTE — Progress Notes (Signed)
Subjective:    Patient ID: [name], female    DOB: 25-Jan-1930, 80 y.o.   MRN: [id number]  HPI Comments: 80 yo WF with tick bite on left ear 4 days ago. She has been having increased redness and swelling and itch since then. She cleaned with peroxide but no relief with symptoms.     Medication List       This list is accurate as of: 10/16/13 11:05 AM.  Always use your most recent med list.               ACCU-CHEK AVIVA PLUS W/DEVICE Kit     ammonium lactate 12 % lotion  Commonly known as:  AMLACTIN  Apply to affected area daily     aspirin 81 MG tablet  Take 81 mg by mouth daily.     bisoprolol-hydrochlorothiazide 5-6.25 MG per tablet  Commonly known as:  ZIAC  Take 1 tablet by mouth daily. For BP     fenofibrate micronized 134 MG capsule  Commonly known as:  LOFIBRA  TAKE ONE CAPSULE BY MOUTH DAILY FOR BLOOD FATS     gabapentin 300 MG capsule  Commonly known as:  NEURONTIN  TAKE ONE CAPSULE BY MOUTH THREE(3) TO FOUR(4)  TIMES DAILY FOR DIABETIC NEUROPATHY PAIN     HAIR/SKIN/NAILS Tabs  Take by mouth 2 (two) times daily.     HUMALOG 100 UNIT/ML injection  Generic drug:  insulin lispro  Inject 5-10 Units into the skin. Patient takes Rx on a sliding scale basis     Insulin Glargine 100 UNIT/ML Solostar Pen  Commonly known as:  LANTUS SOLOSTAR  Inject 30 Units into the skin daily. or as directed to control blood sugars     LORazepam 1 MG tablet  Commonly known as:  ATIVAN     losartan 100 MG tablet  Commonly known as:  COZAAR  TAKE 1 TABLET BY MOUTH DAILY FOR BLOOD PRESSURE HEART AND KIDNEYS     Magnesium 400 MG Caps  Take 500 mg by mouth 2 (two) times daily.     meclizine 25 MG tablet  Commonly known as:  ANTIVERT  Take 25 mg by mouth 3 (three) times daily as needed for dizziness.     meloxicam 15 MG tablet  Commonly known as:  MOBIC  1/2 to 1 tablet daily with food for pain and inflammation     metFORMIN 500 MG tablet  Commonly known as:   GLUCOPHAGE  1/2 (500 mg ) to 1 tablet (1000 mg) twice daily with food for diabetes     Omega 3 1000 MG Caps  Take by mouth daily.     pravastatin 40 MG tablet  Commonly known as:  PRAVACHOL  TAKE 1 TABLET BY MOUTH AT BEDTIME FOR CHOLESTEROL     rOPINIRole 1 MG tablet  Commonly known as:  REQUIP  Take 1 or 2 tablets twice daily (up to 4 tablets daily) for Restless Legs     traMADol 50 MG tablet  Commonly known as:  ULTRAM  Take 50 mg by mouth every 8 (eight) hours as needed for pain.     vitamin B-12 1000 MCG tablet  Commonly known as:  CYANOCOBALAMIN  Take 1,000 mcg by mouth 2 (two) times daily.     vitamin C 500 MG tablet  Commonly known as:  ASCORBIC ACID  Take 500 mg by mouth daily.     Vitamin D3 2000 UNITS Tabs  Take 1 capsule by mouth 2 (  two) times daily.       Allergies  Allergen Reactions  . Morphine And Related Other (See Comments)    fuzzy feeling      Review of Systems  Skin: Positive for rash and wound.  All other systems reviewed and are negative.  BP 144/68  Pulse 56  Temp(Src) 98.4 F (36.9 C) (Temporal)  Resp 18  Ht 5' 4"  (1.626 m)  Wt 143 lb (64.864 kg)  BMI 24.53 kg/m2     Objective:   Physical Exam  Nursing note and vitals reviewed. Constitutional: She is oriented to person, place, and time. She appears well-developed and well-nourished.  HENT:  Head:    Right Ear: External ear normal.  Nose: Nose normal.  Mouth/Throat: Oropharynx is clear and moist.  Left lobe quarter size with edema, with decreased sensation  Eyes: Conjunctivae and EOM are normal.  Neck: Normal range of motion.  Cardiovascular: Normal rate, regular rhythm, normal heart sounds and intact distal pulses.   Pulmonary/Chest: Effort normal and breath sounds normal.  Musculoskeletal: Normal range of motion.  Lymphadenopathy:    She has no cervical adenopathy.  Neurological: She is alert and oriented to person, place, and time.  Skin: Skin is warm and dry.    Psychiatric: She has a normal mood and affect. Judgment normal.          Assessment & Plan:  1. Cellulitis from tick bite- Dexamethasone 10 mg IM, Rocephin 1 gm IM, Doxy 100 mg BID #28 AD, check labs, w/c if SX increase or ER.

## 2013-10-17 ENCOUNTER — Other Ambulatory Visit: Payer: Self-pay | Admitting: Emergency Medicine

## 2013-10-17 ENCOUNTER — Emergency Department (HOSPITAL_COMMUNITY)
Admission: EM | Admit: 2013-10-17 | Discharge: 2013-10-17 | Disposition: A | Discharge status: Home / Self Care | Payer: Medicare Other | Attending: Emergency Medicine | Admitting: Emergency Medicine

## 2013-10-17 ENCOUNTER — Encounter (HOSPITAL_COMMUNITY): Payer: Self-pay | Admitting: Emergency Medicine

## 2013-10-17 DIAGNOSIS — W57XXXA Bitten or stung by nonvenomous insect and other nonvenomous arthropods, initial encounter: Secondary | ICD-10-CM | POA: Insufficient documentation

## 2013-10-17 DIAGNOSIS — Z794 Long term (current) use of insulin: Secondary | ICD-10-CM | POA: Insufficient documentation

## 2013-10-17 DIAGNOSIS — E559 Vitamin D deficiency, unspecified: Secondary | ICD-10-CM | POA: Insufficient documentation

## 2013-10-17 DIAGNOSIS — I10 Essential (primary) hypertension: Secondary | ICD-10-CM | POA: Insufficient documentation

## 2013-10-17 DIAGNOSIS — M549 Dorsalgia, unspecified: Secondary | ICD-10-CM | POA: Insufficient documentation

## 2013-10-17 DIAGNOSIS — Z7982 Long term (current) use of aspirin: Secondary | ICD-10-CM | POA: Insufficient documentation

## 2013-10-17 DIAGNOSIS — H9210 Otorrhea, unspecified ear: Secondary | ICD-10-CM | POA: Insufficient documentation

## 2013-10-17 DIAGNOSIS — Y92009 Unspecified place in unspecified non-institutional (private) residence as the place of occurrence of the external cause: Secondary | ICD-10-CM | POA: Insufficient documentation

## 2013-10-17 DIAGNOSIS — T148 Other injury of unspecified body region: Secondary | ICD-10-CM | POA: Insufficient documentation

## 2013-10-17 DIAGNOSIS — E1149 Type 2 diabetes mellitus with other diabetic neurological complication: Secondary | ICD-10-CM | POA: Insufficient documentation

## 2013-10-17 DIAGNOSIS — G589 Mononeuropathy, unspecified: Secondary | ICD-10-CM | POA: Insufficient documentation

## 2013-10-17 DIAGNOSIS — R21 Rash and other nonspecific skin eruption: Secondary | ICD-10-CM | POA: Insufficient documentation

## 2013-10-17 DIAGNOSIS — Z79899 Other long term (current) drug therapy: Secondary | ICD-10-CM | POA: Insufficient documentation

## 2013-10-17 DIAGNOSIS — M129 Arthropathy, unspecified: Secondary | ICD-10-CM | POA: Insufficient documentation

## 2013-10-17 DIAGNOSIS — E1142 Type 2 diabetes mellitus with diabetic polyneuropathy: Secondary | ICD-10-CM | POA: Insufficient documentation

## 2013-10-17 DIAGNOSIS — E7800 Pure hypercholesterolemia, unspecified: Secondary | ICD-10-CM | POA: Insufficient documentation

## 2013-10-17 DIAGNOSIS — Y9389 Activity, other specified: Secondary | ICD-10-CM | POA: Insufficient documentation

## 2013-10-17 DIAGNOSIS — Z789 Other specified health status: Secondary | ICD-10-CM | POA: Insufficient documentation

## 2013-10-17 DIAGNOSIS — E119 Type 2 diabetes mellitus without complications: Secondary | ICD-10-CM | POA: Insufficient documentation

## 2013-10-17 DIAGNOSIS — G8929 Other chronic pain: Secondary | ICD-10-CM | POA: Insufficient documentation

## 2013-10-17 LAB — LYME ABY, WSTRN BLT IGG & IGM W/BANDS
B burgdorferi IgG Abs (IB): NEGATIVE
B burgdorferi IgM Abs (IB): NEGATIVE
Lyme Disease 18 kD IgG: NONREACTIVE
Lyme Disease 23 kD IgG: NONREACTIVE
Lyme Disease 23 kD IgM: NONREACTIVE
Lyme Disease 28 kD IgG: NONREACTIVE
Lyme Disease 30 kD IgG: NONREACTIVE
Lyme Disease 39 kD IgG: NONREACTIVE
Lyme Disease 39 kD IgM: NONREACTIVE
Lyme Disease 41 kD IgG: NONREACTIVE
Lyme Disease 41 kD IgM: REACTIVE — AB
Lyme Disease 45 kD IgG: NONREACTIVE
Lyme Disease 58 kD IgG: NONREACTIVE
Lyme Disease 66 kD IgG: NONREACTIVE
Lyme Disease 93 kD IgG: NONREACTIVE

## 2013-10-17 LAB — ROCKY MTN SPOTTED FVR ABS PNL(IGG+IGM)
RMSF IgG: 0.11 IV
RMSF IgM: 0.25 IV

## 2013-10-17 NOTE — ED Notes (Signed)
Pt found a tick on 10-14-13 behind LT ear . Pt reports going to PCP and was given a IM injection of steroids and RX's for Vibramycin 100mg  ,Cetirizine Hydrochloride 10mg . Pt also reports while at her PCP yesterday Blood work was done .

## 2013-10-17 NOTE — Progress Notes (Signed)
I called patient to provide Melissa's recommendations but patient states they are already on the way to the ER.  Patient denies any swelling or difficulty swallowing, just notes increasing rash spreading around neck.

## 2013-10-17 NOTE — ED Provider Notes (Signed)
CSN: [id number]     Arrival date & time 10/17/13  1614 History  This chart was scribed for non-physician practitioner Jamse Mead, PA-C working with Blanchie Dessert, MD by Eston Mould, ED Scribe. This patient was seen in room TR04C/TR04C and the patient's care was started at 5:12 PM .   Chief Complaint  Patient presents with  . Tick Removal   The history is provided by the patient. No language interpreter was used.   HPI Comments: [name] is a 80 y.o. female who presents to the Emergency Department complaining of tick removal F/U. Pt states she found a tick 10-14-13 behind her L ear. She reports being seen by PCP and was given a IM injection of steroids and Vancomycin 100-mg, and Cetirizine Hydrochloride 10-mg. States while at PCP yesterday evening, she had labs done and was advised to be seen in the ED for IV abx. She initially suspected she "had trash behind L ear", eventually removed the tick on her own 3 days ago, and reports having clear drainage with crusting shortly after from ear hole. She c/o itching and knot to L ear that began today; states she called her PCP and reports being informed to come to the ED if difficulty swallowing. She states "she feels the rash coming down to her throat". She reports cleaning tick wound with Peroxide and Alcohol but states "the area is worsening". Pt is generally outdoors gardening for short period of time. PCP is Dr. Melford Aase. Denies ear pain, recent fevers, CP, SOB, difficulty breathing, HA, emesis, and diarrhea.  Past Medical History  Diagnosis Date  . Liver lesion   . Chronic back pain   . Neuropathy   . Diabetic neuropathy   . Wears glasses   . Type II or unspecified type diabetes mellitus without mention of complication, not stated as uncontrolled   . Hypercholesteremia   . Hypertension   . Arthritis   . Vitamin D deficiency    Past Surgical History  Procedure Laterality Date  . Abdominal hysterectomy    . Cesarean  section      x 3   . Back surgery  1993    lumb lam-fusion  . Appendectomy    . Colonoscopy    . Eye surgery      both cataracts  . Trigger finger release  01/10/2012    Procedure: RELEASE TRIGGER FINGER/A-1 PULLEY;  Surgeon: Hessie Dibble, MD;  Location: [facility];  Service: Orthopedics;  Laterality: Right;  right ring trigger release   Family History  Problem Relation Age of Onset  . Colon cancer Neg Hx   . Liver disease Sister   . Diabetes Sister    History  Substance Use Topics  . Smoking status: Never Smoker   . Smokeless tobacco: Never Used  . Alcohol Use: No   OB History   Grav Para Term Preterm Abortions TAB SAB Ect Mult Living                 Review of Systems  Constitutional: Negative for fever and chills.  HENT: Positive for ear discharge. Negative for ear pain.   Respiratory: Negative for apnea and shortness of breath.   Cardiovascular: Negative for chest pain.  Gastrointestinal: Negative for nausea, vomiting and diarrhea.  Skin: Positive for rash and wound.  Neurological: Negative for headaches.   Allergies  Morphine and related  Home Medications   Prior to Admission medications   Medication Sig Start Date End Date Taking?  Authorizing Provider  ammonium lactate (LAC-HYDRIN) 12 % lotion Apply 1 application topically at bedtime. Apply to affected area daily 04/26/13 04/26/14 Yes Melissa R Smith, PA-C  aspirin 81 MG tablet Take 81 mg by mouth daily.   Yes Historical Provider, MD  bisoprolol-hydrochlorothiazide (ZIAC) 5-6.25 MG per tablet Take 1 tablet by mouth daily. For BP 06/04/13  Yes [name], MD  cetirizine (ZYRTEC) 10 MG chewable tablet Chew 10 mg by mouth daily.   Yes Historical Provider, MD  Cholecalciferol (VITAMIN D3) 2000 UNITS TABS Take 1 capsule by mouth 2 (two) times daily.   Yes Historical Provider, MD  doxycycline (VIBRA-TABS) 100 MG tablet Take 100 mg by mouth 2 (two) times daily. Started medication on 10-16-13 10/16/13   Yes Melissa R Smith, PA-C  fenofibrate micronized (LOFIBRA) 134 MG capsule TAKE ONE CAPSULE BY MOUTH DAILY FOR BLOOD FATS   Yes Vicie Mutters, PA-C  gabapentin (NEURONTIN) 300 MG capsule TAKE ONE CAPSULE BY MOUTH THREE(3) TO FOUR(4)  TIMES DAILY FOR DIABETIC NEUROPATHY PAIN   Yes Vicie Mutters, PA-C  Insulin Glargine (LANTUS) 100 UNIT/ML Solostar Pen Inject 30 Units into the skin at bedtime. or as directed to control blood sugars 06/04/13 06/04/14 Yes [name], MD  insulin lispro (HUMALOG) 100 UNIT/ML injection Inject 5-10 Units into the skin 3 (three) times daily with meals. Patient takes Rx on a sliding scale basis   Yes Historical Provider, MD  Loratadine 10 MG CAPS Take 1 capsule by mouth daily as needed. Allergies   Yes Historical Provider, MD  LORazepam (ATIVAN) 1 MG tablet Take 1 mg by mouth daily as needed for anxiety.  02/28/13  Yes Historical Provider, MD  losartan (COZAAR) 100 MG tablet TAKE 1 TABLET BY MOUTH DAILY FOR BLOOD PRESSURE HEART AND KIDNEYS 08/16/13  Yes [name], MD  Magnesium 400 MG CAPS Take 500 mg by mouth 2 (two) times daily. 06/04/13  Yes [name], MD  meclizine (ANTIVERT) 25 MG tablet Take 25 mg by mouth 3 (three) times daily as needed for dizziness.   Yes Historical Provider, MD  meloxicam (MOBIC) 15 MG tablet Take 7.5 mg by mouth daily. 1/2 to 1 tablet daily with food for pain and inflammation 06/04/13  Yes [name], MD  metFORMIN (GLUCOPHAGE) 500 MG tablet Take 500 mg by mouth See admin instructions. 1/2 (500 mg ) to 1 tablet (1000 mg) twice daily with food for diabetes 06/04/13  Yes [name], MD  Multiple Vitamins-Minerals (HAIR/SKIN/NAILS) TABS Take by mouth 2 (two) times daily.   Yes Historical Provider, MD  Omega 3 1000 MG CAPS Take 1 capsule by mouth daily.    Yes Historical Provider, MD  pravastatin (PRAVACHOL) 40 MG tablet TAKE 1 TABLET BY MOUTH AT BEDTIME FOR CHOLESTEROL 05/01/13  Yes [name], MD  rOPINIRole (REQUIP) 3 MG  tablet Take 3 mg by mouth 3 (three) times daily.   Yes Historical Provider, MD  traMADol (ULTRAM) 50 MG tablet Take 50 mg by mouth every 8 (eight) hours as needed for pain.   Yes Historical Provider, MD  vitamin B-12 (CYANOCOBALAMIN) 1000 MCG tablet Take 1,000 mcg by mouth 2 (two) times daily.   Yes Historical Provider, MD  vitamin C (ASCORBIC ACID) 500 MG tablet Take 500 mg by mouth daily.   Yes Historical Provider, MD  Blood Glucose Monitoring Suppl (ACCU-CHEK AVIVA PLUS) W/DEVICE KIT  05/17/13   Historical Provider, MD   BP 161/51  Pulse 55  Temp(Src) 98.5 F (36.9 C)  Resp 18  SpO2 100%  Physical Exam  Nursing note and vitals reviewed. Constitutional: She is oriented to person, place, and time. She appears well-developed and well-nourished. No distress.  HENT:  Head: Normocephalic and atraumatic.  Right Ear: Hearing, tympanic membrane, external ear and ear canal normal.  Left Ear: Hearing, tympanic membrane, external ear and ear canal normal.  Mouth/Throat: Oropharynx is clear and moist. No oropharyngeal exudate.  Left ear: Mild swelling with erythema and crust identified to the lobe with negative pain upon palpation. Negative induration or fluctuance identified. Negative active drainage or bleeding noted.  Mild papular rash identified to the left aspect of the neck running down into the anterior aspect of the neck with negative pain upon palpation. Mild erythema at the base noted.   Eyes: Conjunctivae and EOM are normal. Pupils are equal, round, and reactive to light. Right eye exhibits no discharge. Left eye exhibits no discharge.  Neck: Normal range of motion. Neck supple. No tracheal deviation present.  Mild lymphadenopathy identified to the left tonsillar aspect-anterior cervical region. Soft upon palpation, mobile-negative tenderness upon palpation.   Cardiovascular: Normal rate, regular rhythm and normal heart sounds.  Exam reveals no friction rub.   No murmur  heard. Pulmonary/Chest: Effort normal and breath sounds normal. No respiratory distress. She has no wheezes. She has no rales.  Patient is able to speak in full sentences without difficulty Negative use of accessory muscles Negative stridor  Musculoskeletal: Normal range of motion.  Full ROM to upper and lower extremities without difficulty noted, negative ataxia noted.  Lymphadenopathy:    She has cervical adenopathy.  Neurological: She is alert and oriented to person, place, and time. No cranial nerve deficit. She exhibits normal muscle tone. Coordination normal.  Cranial nerves III-XII grossly intact Strength 5+/5+ to upper and lower extremities bilaterally with resistance applied, equal distribution noted Negative facial drooping Negative slurred speech Negative aphasia Gait proper, proper balance - negative sway, negative drift, negative step-offs  Skin: Skin is warm and dry.  Psychiatric: She has a normal mood and affect. Her behavior is normal.    ED Course  Procedures (including critical care time) DIAGNOSTIC STUDIES: Oxygen Saturation is 100% on room air, normal by my interpretation.    COORDINATION OF CARE: 5:20 PM-Discussed treatment plan which includes labs. Pt agreed to plan.   Labs Review Labs Reviewed - No data to display  Imaging Review No results found.   EKG Interpretation None     MDM   Final diagnoses:  Tick bite  Rash    Filed Vitals:   10/17/13 1744 10/17/13 1753  BP:  161/51  Pulse:  55  Temp:  98.5 F (36.9 C)  Resp:  18  SpO2: 100% 100%    I personally performed the services described in this documentation, which was scribed in my presence. The recorded information has been reviewed and is accurate.  Patient presenting to the ED with rash secondary to tick bite-erythematous and swollen left ear lobe with negative active drainage or bleeding noted with rash extending to the left side and radiating to the anterior aspect of the  neck-appears to be in color relation. Rash appears to be papular with erythematous base with negative active drainage or bleeding. Negative swelling noted. Tick has been removed approximately 3 days ago. Patient was seen and assessed yesterday by her primary care provider -  Where blood work was performed and patient was started on doxycycline. Patient seen and assessed by her primary care provider today. This provider  spoke with attending physician, Dr. Maryan Rued who recommended patient to be discharged, reported that patient needs to continue taking doxycycline as prescribed for patient to followup with primary care provider. Patient stable, afebrile. Patient not septic appearing. Doubt erythema migrans. Denied chest pain, shortness of breath, difficulty breathing. Patient is able to speak in full senses that difficulty. Patient ambulated with pulse ox 100% room air. Discharged patient. Discussed with patient to continue taking medications as prescribed. Discussed with patient to avoid any physical strenuous activity. Discussed with patient to closely monitor symptoms and if symptoms are to worsen or change to report back to the ED - strict return instructions given.  Patient agreed to plan of care, understood, all questions answered.   Jamse Mead, PA-C 10/18/13 [phone]

## 2013-10-17 NOTE — ED Notes (Signed)
Declined W/C at D/C and was escorted to lobby by RN. 

## 2013-10-17 NOTE — Discharge Instructions (Signed)
Please call your doctor for a followup appointment within 24-48 hours. When you talk to your doctor please let them know that you were seen in the emergency department and have them acquire all of your records so that they can discuss the findings with you and formulate a treatment plan to fully care for your new and ongoing problems. Please continue doxycycline as prescribed-this can take a couple of days to work regarding rash Please call and set-up an appointment with your primary care provider to be reassessed Please rest and stay hydrated Please try not to scratch the rash Please continue to monitor symptoms closely and if symptoms are to worsen or change (fever greater than 101, chills, chest pain, shortness of breath, difficulty breathing, throat closing sensation, numbness, tingling, worsening or changes to rash, nausea, vomiting, stomach pain, weakness, blurred vision, sudden loss of vision, numbness to the left side of the face) please report back to the ED immediately  Tick Bite Information Ticks are insects that attach themselves to the skin and draw blood for food. There are various types of ticks. Common types include wood ticks and deer ticks. Most ticks live in shrubs and grassy areas. Ticks can climb onto your body when you make contact with leaves or grass where the tick is waiting. The most common places on the body for ticks to attach themselves are the scalp, neck, armpits, waist, and groin. Most tick bites are harmless, but sometimes ticks carry germs that cause diseases. These germs can be spread to a person during the tick's feeding process. The chance of a disease spreading through a tick bite depends on:   The type of tick.  Time of year.   How long the tick is attached.   Geographic location.  HOW CAN YOU PREVENT TICK BITES? Take these steps to help prevent tick bites when you are outdoors:  Wear protective clothing. Long sleeves and long pants are best.   Wear  white clothes so you can see ticks more easily.  Tuck your pant legs into your socks.   If walking on a trail, stay in the middle of the trail to avoid brushing against bushes.  Avoid walking through areas with long grass.  Put insect repellent on all exposed skin and along boot tops, pant legs, and sleeve cuffs.   Check clothing, hair, and skin repeatedly and before going inside.   Brush off any ticks that are not attached.  Take a shower or bath as soon as possible after being outdoors.  WHAT IS THE PROPER WAY TO REMOVE A TICK? Ticks should be removed as soon as possible to help prevent diseases caused by tick bites. 1. If latex gloves are available, put them on before trying to remove a tick.  2. Using fine-point tweezers, grasp the tick as close to the skin as possible. You may also use curved forceps or a tick removal tool. Grasp the tick as close to its head as possible. Avoid grasping the tick on its body. 3. Pull gently with steady upward pressure until the tick lets go. Do not twist the tick or jerk it suddenly. This may break off the tick's head or mouth parts. 4. Do not squeeze or crush the tick's body. This could force disease-carrying fluids from the tick into your body.  5. After the tick is removed, wash the bite area and your hands with soap and water or other disinfectant such as alcohol. 6. Apply a small amount of antiseptic cream or  ointment to the bite site.  7. Wash and disinfect any instruments that were used.  Do not try to remove a tick by applying a hot match, petroleum jelly, or fingernail polish to the tick. These methods do not work and may increase the chances of disease being spread from the tick bite.  WHEN SHOULD YOU SEEK MEDICAL CARE? Contact your health care provider if you are unable to remove a tick from your skin or if a part of the tick breaks off and is stuck in the skin.  After a tick bite, you need to be aware of signs and symptoms that  could be related to diseases spread by ticks. Contact your health care provider if you develop any of the following in the days or weeks after the tick bite:  Unexplained fever.  Rash. A circular rash that appears days or weeks after the tick bite may indicate the possibility of Lyme disease. The rash may resemble a target with a bull's-eye and may occur at a different part of your body than the tick bite.  Redness and swelling in the area of the tick bite.   Tender, swollen lymph glands.   Diarrhea.   Weight loss.   Cough.   Fatigue.   Muscle, joint, or bone pain.   Abdominal pain.   Headache.   Lethargy or a change in your level of consciousness.  Difficulty walking or moving your legs.   Numbness in the legs.   Paralysis.  Shortness of breath.   Confusion.   Repeated vomiting.  Document Released: 04/08/2000 Document Revised: 01/30/2013 Document Reviewed: 09/19/2012 [facility] Patient Information 2015 [place], [place]. This information is not intended to replace advice given to you by your health care provider. Make sure you discuss any questions you have with your health care provider.

## 2013-10-18 ENCOUNTER — Ambulatory Visit: Payer: Self-pay | Admitting: Emergency Medicine

## 2013-10-18 ENCOUNTER — Ambulatory Visit (INDEPENDENT_AMBULATORY_CARE_PROVIDER_SITE_OTHER): Payer: Medicare Other | Admitting: Emergency Medicine

## 2013-10-18 ENCOUNTER — Encounter: Payer: Self-pay | Admitting: Emergency Medicine

## 2013-10-18 VITALS — BP 126/64 | HR 56 | Temp 98.4°F | Resp 18 | Ht 64.0 in | Wt 143.0 lb

## 2013-10-18 DIAGNOSIS — IMO0001 Reserved for inherently not codable concepts without codable children: Secondary | ICD-10-CM

## 2013-10-18 NOTE — ED Provider Notes (Signed)
Medical screening examination/treatment/procedure(s) were performed by non-physician practitioner and as supervising physician I was immediately available for consultation/collaboration.   EKG Interpretation None        Blanchie Dessert, MD 10/18/13 1409

## 2013-10-18 NOTE — Progress Notes (Signed)
Subjective:    Patient ID: [name], female    DOB: 01/28/1930, 80 y.o.   MRN: [id number]  HPI Comments: 80 yo female with recent tick bite on 10/12/13 with cellulitis recheck. She did go to ED yesterday because itching was worse. ED only recommended adding aveeno lotion to current plan. She notes + improvement with itch and swelling this morning.      Medication List       This list is accurate as of: 10/18/13  9:07 AM.  Always use your most recent med list.               ACCU-CHEK AVIVA PLUS W/DEVICE Kit     ammonium lactate 12 % lotion  Commonly known as:  LAC-HYDRIN  Apply 1 application topically at bedtime. Apply to affected area daily     aspirin 81 MG tablet  Take 81 mg by mouth daily.     bisoprolol-hydrochlorothiazide 5-6.25 MG per tablet  Commonly known as:  ZIAC  Take 1 tablet by mouth daily. For BP     cetirizine 10 MG chewable tablet  Commonly known as:  ZYRTEC  Chew 10 mg by mouth daily.     doxycycline 100 MG tablet  Commonly known as:  VIBRA-TABS  Take 100 mg by mouth 2 (two) times daily. Started medication on 10-16-13     fenofibrate micronized 134 MG capsule  Commonly known as:  LOFIBRA  TAKE ONE CAPSULE BY MOUTH DAILY FOR BLOOD FATS     gabapentin 300 MG capsule  Commonly known as:  NEURONTIN  TAKE ONE CAPSULE BY MOUTH THREE(3) TO FOUR(4)  TIMES DAILY FOR DIABETIC NEUROPATHY PAIN     HAIR/SKIN/NAILS Tabs  Take by mouth 2 (two) times daily.     HUMALOG 100 UNIT/ML injection  Generic drug:  insulin lispro  Inject 5-10 Units into the skin 3 (three) times daily with meals. Patient takes Rx on a sliding scale basis     Insulin Glargine 100 UNIT/ML Solostar Pen  Commonly known as:  LANTUS  Inject 30 Units into the skin at bedtime. or as directed to control blood sugars     Loratadine 10 MG Caps  Take 1 capsule by mouth daily as needed. Allergies     LORazepam 1 MG tablet  Commonly known as:  ATIVAN  Take 1 mg by mouth daily as needed for  anxiety.     losartan 100 MG tablet  Commonly known as:  COZAAR  TAKE 1 TABLET BY MOUTH DAILY FOR BLOOD PRESSURE HEART AND KIDNEYS     Magnesium 400 MG Caps  Take 500 mg by mouth 2 (two) times daily.     meclizine 25 MG tablet  Commonly known as:  ANTIVERT  Take 25 mg by mouth 3 (three) times daily as needed for dizziness.     meloxicam 15 MG tablet  Commonly known as:  MOBIC  Take 7.5 mg by mouth daily. 1/2 to 1 tablet daily with food for pain and inflammation     metFORMIN 500 MG tablet  Commonly known as:  GLUCOPHAGE  Take 500 mg by mouth See admin instructions. 1/2 (500 mg ) to 1 tablet (1000 mg) twice daily with food for diabetes     Omega 3 1000 MG Caps  Take 1 capsule by mouth daily.     pravastatin 40 MG tablet  Commonly known as:  PRAVACHOL  TAKE 1 TABLET BY MOUTH AT BEDTIME FOR CHOLESTEROL     rOPINIRole  3 MG tablet  Commonly known as:  REQUIP  Take 3 mg by mouth 3 (three) times daily.     traMADol 50 MG tablet  Commonly known as:  ULTRAM  Take 50 mg by mouth every 8 (eight) hours as needed for pain.     vitamin B-12 1000 MCG tablet  Commonly known as:  CYANOCOBALAMIN  Take 1,000 mcg by mouth 2 (two) times daily.     vitamin C 500 MG tablet  Commonly known as:  ASCORBIC ACID  Take 500 mg by mouth daily.     Vitamin D3 2000 UNITS Tabs  Take 1 capsule by mouth 2 (two) times daily.       Allergies  Allergen Reactions  . Morphine And Related Other (See Comments)    fuzzy feeling   Past Medical History  Diagnosis Date  . Liver lesion   . Chronic back pain   . Neuropathy   . Diabetic neuropathy   . Wears glasses   . Type II or unspecified type diabetes mellitus without mention of complication, not stated as uncontrolled   . Hypercholesteremia   . Hypertension   . Arthritis   . Vitamin D deficiency      Review of Systems  Skin: Positive for rash.  All other systems reviewed and are negative.  BP 126/64  Pulse 56  Temp(Src) 98.4 F (36.9  C) (Temporal)  Resp 18  Ht 5' 4"  (1.626 m)  Wt 143 lb (64.864 kg)  BMI 24.53 kg/m2     Objective:   Physical Exam  Nursing note and vitals reviewed. Constitutional: She appears well-developed and well-nourished.  Neurological: She is alert.  Skin: Skin is warm. Rash noted. There is erythema.  Left ear lobe was erythematous firm approximately quarter size and has decreased to nickel size with less firmness. Hard mass in neck has almost resolved completely.  Psychiatric: Judgment normal.          Assessment & Plan:  Tick bite 10/12/13 left ear lobe with cellulitis- + improvement continue current plan and call with any concerns.

## 2013-11-12 ENCOUNTER — Ambulatory Visit: Payer: Self-pay | Admitting: Emergency Medicine

## 2013-11-22 ENCOUNTER — Ambulatory Visit (INDEPENDENT_AMBULATORY_CARE_PROVIDER_SITE_OTHER): Payer: Medicare Other | Admitting: Emergency Medicine

## 2013-11-22 ENCOUNTER — Encounter: Payer: Self-pay | Admitting: Emergency Medicine

## 2013-11-22 VITALS — BP 160/64 | HR 52 | Temp 98.2°F | Resp 16 | Ht 64.0 in | Wt 143.0 lb

## 2013-11-22 DIAGNOSIS — M810 Age-related osteoporosis without current pathological fracture: Secondary | ICD-10-CM

## 2013-11-22 DIAGNOSIS — I10 Essential (primary) hypertension: Secondary | ICD-10-CM

## 2013-11-22 DIAGNOSIS — S0006XD Insect bite (nonvenomous) of scalp, subsequent encounter: Secondary | ICD-10-CM

## 2013-11-22 DIAGNOSIS — S1096XD Insect bite of unspecified part of neck, subsequent encounter: Secondary | ICD-10-CM

## 2013-11-22 DIAGNOSIS — Z1331 Encounter for screening for depression: Secondary | ICD-10-CM

## 2013-11-22 DIAGNOSIS — E1149 Type 2 diabetes mellitus with other diabetic neurological complication: Secondary | ICD-10-CM

## 2013-11-22 DIAGNOSIS — S0086XD Insect bite (nonvenomous) of other part of head, subsequent encounter: Secondary | ICD-10-CM

## 2013-11-22 DIAGNOSIS — Z789 Other specified health status: Secondary | ICD-10-CM

## 2013-11-22 DIAGNOSIS — L089 Local infection of the skin and subcutaneous tissue, unspecified: Secondary | ICD-10-CM

## 2013-11-22 DIAGNOSIS — E782 Mixed hyperlipidemia: Secondary | ICD-10-CM

## 2013-11-22 DIAGNOSIS — Z0000 Encounter for general adult medical examination without abnormal findings: Secondary | ICD-10-CM

## 2013-11-22 LAB — HEPATIC FUNCTION PANEL
ALT: 14 U/L (ref 0–35)
AST: 17 U/L (ref 0–37)
Albumin: 4.3 g/dL (ref 3.5–5.2)
Alkaline Phosphatase: 56 U/L (ref 39–117)
Bilirubin, Direct: 0.1 mg/dL (ref 0.0–0.3)
Indirect Bilirubin: 0.3 mg/dL (ref 0.2–1.2)
Total Bilirubin: 0.4 mg/dL (ref 0.2–1.2)
Total Protein: 7.1 g/dL (ref 6.0–8.3)

## 2013-11-22 LAB — BASIC METABOLIC PANEL WITH GFR
BUN: 31 mg/dL — ABNORMAL HIGH (ref 6–23)
CO2: 30 mEq/L (ref 19–32)
Calcium: 10.3 mg/dL (ref 8.4–10.5)
Chloride: 103 mEq/L (ref 96–112)
Creat: 1.32 mg/dL — ABNORMAL HIGH (ref 0.50–1.10)
GFR, Est African American: 44 mL/min — ABNORMAL LOW
GFR, Est Non African American: 38 mL/min — ABNORMAL LOW
Glucose, Bld: 97 mg/dL (ref 70–99)
Potassium: 4.7 mEq/L (ref 3.5–5.3)
Sodium: 139 mEq/L (ref 135–145)

## 2013-11-22 LAB — CBC WITH DIFFERENTIAL/PLATELET
Basophils Absolute: 0.1 10*3/uL (ref 0.0–0.1)
Basophils Relative: 1 % (ref 0–1)
Eosinophils Absolute: 0.7 10*3/uL (ref 0.0–0.7)
Eosinophils Relative: 7 % — ABNORMAL HIGH (ref 0–5)
HCT: 35.6 % — ABNORMAL LOW (ref 36.0–46.0)
Hemoglobin: 12.1 g/dL (ref 12.0–15.0)
Lymphocytes Relative: 36 % (ref 12–46)
Lymphs Abs: 3.8 10*3/uL (ref 0.7–4.0)
MCH: 30.8 pg (ref 26.0–34.0)
MCHC: 34 g/dL (ref 30.0–36.0)
MCV: 90.6 fL (ref 78.0–100.0)
Monocytes Absolute: 1.1 10*3/uL — ABNORMAL HIGH (ref 0.1–1.0)
Monocytes Relative: 10 % (ref 3–12)
Neutro Abs: 4.8 10*3/uL (ref 1.7–7.7)
Neutrophils Relative %: 46 % (ref 43–77)
Platelets: 348 10*3/uL (ref 150–400)
RBC: 3.93 MIL/uL (ref 3.87–5.11)
RDW: 13.3 % (ref 11.5–15.5)
WBC: 10.5 10*3/uL (ref 4.0–10.5)

## 2013-11-22 LAB — LIPID PANEL
Cholesterol: 145 mg/dL (ref 0–200)
HDL: 61 mg/dL (ref >39)
LDL Cholesterol: 57 mg/dL (ref 0–99)
Total CHOL/HDL Ratio: 2.4 Ratio
Triglycerides: 135 mg/dL (ref <150)
VLDL: 27 mg/dL (ref 0–40)

## 2013-11-22 LAB — HEMOGLOBIN A1C
Hgb A1c MFr Bld: 8.9 % — ABNORMAL HIGH (ref <5.7)
Mean Plasma Glucose: 209 mg/dL — ABNORMAL HIGH (ref <117)

## 2013-11-22 NOTE — Patient Instructions (Signed)
Betadine and sugar paste on any sores. Dryer sheets before walking outside.   Diabetic Neuropathy Diabetic neuropathy is a nerve disease or nerve damage that is caused by diabetes mellitus. About half of all people with diabetes mellitus have some form of nerve damage. Nerve damage is more common in those who have had diabetes mellitus for many years and who generally have not had good control of their blood sugar (glucose) level. Diabetic neuropathy is a common complication of diabetes mellitus. There are three more common types of diabetic neuropathy and a fourth type that is less common and less understood:   Peripheral neuropathy--This is the most common type of diabetic neuropathy. It causes damage to the nerves of the feet and legs first and then eventually the hands and arms.The damage affects the ability to sense touch.  Autonomic neuropathy--This type causes damage to the autonomic nervous system, which controls the following functions:  Heartbeat.  Body temperature.  Blood pressure.  Urination.  Digestion.  Sweating.  Sexual function.  Focal neuropathy--Focal neuropathy can be painful and unpredictable and occurs most often in older adults with diabetes mellitus. It involves a specific nerve or one area and often comes on suddenly. It usually does not cause long-term problems.  Radiculoplexus neuropathy-- Sometimes called lumbosacral radiculoplexus neuropathy, radiculoplexus neuropathy affects the nerves of the thighs, hips, buttocks, or legs. It is more common in people with type 2 diabetes mellitus and in older men. It is characterized by debilitating pain, weakness, and atrophy, usually in the thigh muscles. CAUSES  The cause of peripheral, autonomic, and focal neuropathies is diabetes mellitus that is uncontrolled and high glucose levels. The cause of radiculoplexus neuropathy is unknown. However, it is thought to be caused by inflammation related to uncontrolled glucose  levels. SIGNS AND SYMPTOMS  Peripheral Neuropathy Peripheral neuropathy develops slowly over time. When the nerves of the feet and legs no longer work there may be:   Burning, stabbing, or aching pain in the legs or feet.  Inability to feel pressure or pain in your feet. This can lead to:  Thick calluses over pressure areas.  Pressure sores.  Ulcers.  Foot deformities.  Reduced ability to feel temperature changes.  Muscle weakness. Autonomic Neuropathy The symptoms of autonomic neuropathy vary depending on which nerves are affected. Symptoms may include:  Problems with digestion, such as:  Feeling sick to your stomach (nausea).  Vomiting.  Bloating.  Constipation.  Diarrhea.  Abdominal pain.  Difficulty with urination. This occurs if you lose your ability to sense when your bladder is full. Problems include:  Urine leakage (incontinence).  Inability to empty your bladder completely (retention).  Rapid or irregular heartbeat (palpitations).  Blood pressure drops when you stand up (orthostatic hypotension). When you stand up you may feel:  Dizzy.  Weak.  Faint.  In men, inability to attain and maintain an erection.  In women, vaginal dryness and problems with decreased sexual desire and arousal.  Problems with body temperature regulation.  Increased or decreased sweating. Focal Neuropathy  Abnormal eye movements or abnormal alignment of both eyes.  Weakness in the wrist.  Foot drop. This results in an inability to lift the foot properly and abnormal walking or foot movement.  Paralysis on one side of your face (Bell palsy).  Chest or abdominal pain. Radiculoplexus Neuropathy  Sudden, severe pain in your hip, thigh, or buttocks.  Weakness and wasting of thigh muscles.  Difficulty rising from a seated position.  Abdominal swelling.  Unexplained weight loss (  usually more than 10 lb [4.5 kg]). DIAGNOSIS  Peripheral Neuropathy Your senses  may be tested. Sensory function testing can be done with:  A light touch using a monofilament.  A vibration with tuning fork.  A sharp sensation with a pin prick. Other tests that can help diagnose neuropathy are:  Nerve conduction velocity. This test checks the transmission of an electrical current through a nerve.  Electromyography. This shows how muscles respond to electrical signals transmitted by nearby nerves.  Quantitative sensory testing. This is used to assess how your nerves respond to vibrations and changes in temperature. Autonomic Neuropathy Diagnosis is often based on reported symptoms. Tell your health care provider if you experience:   Dizziness.   Constipation.   Diarrhea.   Inappropriate urination or inability to urinate.   Inability to get or maintain an erection.  Tests that may be done include:   Electrocardiography or Holter monitor. These are tests that can help show problems with the heart rate or heart rhythm.   An X-ray exam may be done. Focal Neuropathy Diagnosis is made based on your symptoms and what your health care provider finds during your exam. Other tests may be done. They may include:  Nerve conduction velocities. This checks the transmission of electrical current through a nerve.  Electromyography. This shows how muscles respond to electrical signals transmitted by nearby nerves.  Quantitative sensory testing. This test is used to assess how your nerves respond to vibration and changes in temperature. Radiculoplexus Neuropathy  Often the first thing is to eliminate any other issue or problems that might be the cause, as there is no stick test for diagnosis.  X-ray exam of your spine and lumbar region.  Spinal tap to rule out cancer.  MRI to rule out other lesions. TREATMENT  Once nerve damage occurs, it cannot be reversed. The goal of treatment is to keep the disease or nerve damage from getting worse and affecting more nerve  fibers. Controlling your blood glucose level is the key. Most people with radiculoplexus neuropathy see at least a partial improvement over time. You will need to keep your blood glucose and HbA1c levels in the target range determined by your health care provider. Things that help control blood glucose levels include:   Blood glucose monitoring.   Meal planning.   Physical activity.   Diabetes medicine.  Over time, maintaining lower blood glucose levels helps lessen symptoms. Sometimes, prescription pain medicine is needed. HOME CARE INSTRUCTIONS:  Do not smoke.  Keep your blood glucose level in the range that you and your health care provider have determined acceptable for you.  Keep your blood pressure level in the range that you and your health care provider have determined acceptable for you.  Eat a well-balanced diet.  Be active every day.  Check your feet every day. SEEK MEDICAL CARE IF:   You have burning, stabbing, or aching pain in the legs or feet.  You are unable to feel pressure or pain in your feet.  You develop problems with digestion such as:  Nausea.  Vomiting.  Bloating.  Constipation.  Diarrhea.  Abdominal pain.  You have difficulty with urination, such as:  Incontinence.  Retention.  You have palpitations.  You develop orthostatic hypotension. When you stand up you may feel:  Dizzy.  Weak.  Faint.  You cannot attain and maintain an erection (in men).  You have vaginal dryness and problems with decreased sexual desire and arousal (in women).  You have  severe pain in your thighs, legs, or buttocks.  You have unexplained weight loss. Document Released: 06/20/2001 Document Revised: 01/30/2013 Document Reviewed: 09/20/2012 [facility] Patient Information 2015 [employer], [place]. This information is not intended to replace advice given to you by your health care provider. Make sure you discuss any questions you have with your health care  provider.

## 2013-11-22 NOTE — Progress Notes (Deleted)
Patient ID: SUMMIT BORCHARDT, female   DOB: [birth year], 80 y.o.   MRN: [id number] MEDICARE ANNUAL WELLNESS VISIT AND CPE  Assessment:     Plan:   During the course of the visit the patient was educated and counseled about appropriate screening and preventive services including:    Pneumococcal vaccine   Influenza vaccine  Td vaccine  Screening electrocardiogram  Screening mammography  Bone densitometry screening  Colorectal cancer screening  Diabetes screening  Glaucoma screening  Nutrition counseling   Advanced directives: given information/requested  Screening recommendations, referrals:  Vaccinations: Tdap vaccine {not indicated/requested/declined:14582} Influenza vaccine {not indicated/requested/declined:14582} Pneumococcal vaccine {not indicated/requested/declined:14582} Shingles vaccine {not indicated/requested/declined:14582} Hep B vaccine {not indicated/requested/declined:14582}  Nutrition assessed and recommended  Colonoscopy {not indicated/requested/declined:14582} Mammogram {not indicated/requested/declined:14582} Pap smear not indicated Pelvic exam not indicated Recommended yearly ophthalmology/optometry visit for glaucoma screening and checkup Recommended yearly dental visit for hygiene and checkup Advanced directives - {not indicated/requested/declined:14582}  Conditions/risks identified: BMI: Discussed weight loss, diet, and increase physical activity.  Increase physical activity: AHA recommends 150 minutes of physical activity a week.  Medications reviewed DEXA- {not indicated/requested/declined:14582} Diabetes at goal, ACE/ARB therapy {Ace Inhibitor Therapy:20833} RA- on DMARD*** Urinary Incontinence {ACTION; IS/IS BWI:20355974} an issue: discussed non pharmacology and pharmacology options.  Fall risk: {Desc; low/moderate/high:110033}- discussed PT, home fall assessment, medications.   Subjective:   [name] is a 80 y.o. female who  presents for Medicare Annual Wellness Visit and complete physical.    Date of last medicare wellness visit is unknown.  BS running better except with lumbar injections. She does have right side leg numbness on off in leg and thinks from back but uncertain if it could be related to diabetes.  She has had elevated blood pressure since ***. Her blood pressure {HAS HAS NOT:18834} been controlled at home, today their BP is BP: 160/64 mmHg She {DOES_DOES BUL:84536} workout. She denies chest pain, shortness of breath, dizziness.  She {ACTION; IS/IS IWO:03212248} on cholesterol medication and denies myalgias. Her cholesterol {ACTION; IS/IS GNO:03704888} at goal. The cholesterol last visit was:   Lab Results  Component Value Date   CHOL 166 08/06/2013   HDL 53 08/06/2013   LDLCALC 69 08/06/2013   TRIG 222* 08/06/2013   CHOLHDL 3.1 08/06/2013   She has had diabetes for *** years. She {Has/has not:18111} been working on diet and exercise for ***prediabetes, and denies {Symptoms; diabetes w/o none:19199}. Last A1C in the office was:  Lab Results  Component Value Date   HGBA1C 10.2* 08/06/2013   Patient is on Vitamin D supplement.   Lab Results  Component Value Date   VD25OH 55 08/06/2013       Names of Other Physician/Practitioners you currently use: 1. [facility]- here for primary care 2. ***, eye doctor, last visit *** 3. ***, dentist, last visit *** Patient Care Team: [name], MD as PCP - General (Internal Medicine)   Medication Review Current Outpatient Prescriptions on File Prior to Visit  Medication Sig Dispense Refill  . ammonium lactate (LAC-HYDRIN) 12 % lotion Apply 1 application topically at bedtime. Apply to affected area daily      . aspirin 81 MG tablet Take 81 mg by mouth daily.      . bisoprolol-hydrochlorothiazide (ZIAC) 5-6.25 MG per tablet Take 1 tablet by mouth daily. For BP      . Blood Glucose Monitoring Suppl (ACCU-CHEK AVIVA  PLUS) W/DEVICE KIT       . cetirizine (ZYRTEC) 10 MG  chewable tablet Chew 10 mg by mouth daily.      . Cholecalciferol (VITAMIN D3) 2000 UNITS TABS Take 1 capsule by mouth 2 (two) times daily.      . fenofibrate micronized (LOFIBRA) 134 MG capsule TAKE ONE CAPSULE BY MOUTH DAILY FOR BLOOD FATS  90 capsule  98  . gabapentin (NEURONTIN) 300 MG capsule TAKE ONE CAPSULE BY MOUTH THREE(3) TO FOUR(4)  TIMES DAILY FOR DIABETIC NEUROPATHY PAIN  360 capsule  2  . Insulin Glargine (LANTUS) 100 UNIT/ML Solostar Pen Inject 30 Units into the skin at bedtime. or as directed to control blood sugars      . insulin lispro (HUMALOG) 100 UNIT/ML injection Inject 5-10 Units into the skin 3 (three) times daily with meals. Patient takes Rx on a sliding scale basis      . Loratadine 10 MG CAPS Take 1 capsule by mouth daily as needed. Allergies      . LORazepam (ATIVAN) 1 MG tablet Take 1 mg by mouth daily as needed for anxiety.       Marland Kitchen losartan (COZAAR) 100 MG tablet TAKE 1 TABLET BY MOUTH DAILY FOR BLOOD PRESSURE HEART AND KIDNEYS  90 tablet  2  . Magnesium 400 MG CAPS Take 500 mg by mouth 2 (two) times daily.  500 capsule    . meclizine (ANTIVERT) 25 MG tablet Take 25 mg by mouth 3 (three) times daily as needed for dizziness.      . meloxicam (MOBIC) 15 MG tablet Take 7.5 mg by mouth daily. 1/2 to 1 tablet daily with food for pain and inflammation      . metFORMIN (GLUCOPHAGE) 500 MG tablet Take 500 mg by mouth See admin instructions. 1/2 (500 mg ) to 1 tablet (1000 mg) twice daily with food for diabetes      . Multiple Vitamins-Minerals (HAIR/SKIN/NAILS) TABS Take by mouth 2 (two) times daily.      . Omega 3 1000 MG CAPS Take 1 capsule by mouth daily.       . pravastatin (PRAVACHOL) 40 MG tablet TAKE 1 TABLET BY MOUTH AT BEDTIME FOR CHOLESTEROL      . rOPINIRole (REQUIP) 3 MG tablet Take 3 mg by mouth 3 (three) times daily.      . traMADol (ULTRAM) 50 MG tablet Take 50 mg by mouth every 8 (eight) hours as needed for  pain.      . vitamin B-12 (CYANOCOBALAMIN) 1000 MCG tablet Take 1,000 mcg by mouth 2 (two) times daily.      . vitamin C (ASCORBIC ACID) 500 MG tablet Take 500 mg by mouth daily.       No current facility-administered medications on file prior to visit.    Current Problems (verified) Patient Active Problem List   Diagnosis Date Noted  . Encounter for long-term (current) use of other medications 08/06/2013  . T1 IDDM w/Neuropathy 06/04/2013  . T1 IDDM w/nephropathy   . Mixed hyperlipidemia   . Arthritis   . Vitamin D deficiency   . Hypertension 07/29/2012  . Chronic pain 07/29/2012    Screening Tests Health Maintenance  Topic Date Due  . Foot Exam  01/21/1940  . Ophthalmology Exam  01/21/1940  . Urine Microalbumin  01/21/1942  . Tetanus/tdap  01/22/1951  . Zostavax  01/22/1992  . Influenza Vaccine  11/23/2013  . Hemoglobin A1c  02/05/2014  . Colonoscopy  02/18/2015  . Pneumococcal Polysaccharide Vaccine Age 14 And Over  Completed    Immunization History  Administered  Date(s) Administered  . DT 01/04/2011  . Influenza Split 01/23/2013  . Pneumococcal Polysaccharide-23 01/06/2012    Preventative care: Last colonoscopy: 2006 Last mammogram: 03/07/13 Last pap smear/pelvic exam: declines   DEXA:2007  Prior vaccinations: TD or Tdap: 2012  Influenza: 2014 Pneumococcal: 2013 Shingles/Zostavax:declines to cost  History reviewed: allergies, current medications, past family history, past medical history, past social history, past surgical history and problem list  Risk Factors: Osteoporosis: {osteoporosis causes:17871} History of fracture in the past year: {YES NO:22349}  Tobacco History  Substance Use Topics  . Smoking status: Never Smoker   . Smokeless tobacco: Never Used  . Alcohol Use: No   She {does/does not:19097} smoke.  Patient {ACTION; IS/IS UKG:25427062} a former smoker. Are there smokers in your home (other than you)?   {yes/no:20286}  Alcohol Current alcohol use: {history; alcohol:11675}  Caffeine Current caffeine use: {caffeine use:31917}  Exercise  Current exercise: {exercise types:16438}  Nutrition/Diet Current diet: {diet habits:16563}  Cardiac risk factors: {risk factors:510}.  Depression Screen (Note: if answer to either of the following is "Yes", a more complete depression screening is indicated)   Q1: Over the past two weeks, have you felt down, depressed or hopeless? {Responses; yes/no (default no):140031::"No"}  Q2: Over the past two weeks, have you felt little interest or pleasure in doing things? {Responses; yes/no (default no):140031::"No"}  Have you lost interest or pleasure in daily life? {Responses; yes/no (default no):140031::"No"}  Do you often feel hopeless? {Responses; yes/no (default no):140031::"No"}  Do you cry easily over simple problems? {Responses; yes/no (default no):140031::"No"}  Activities of Daily Living In your present state of health, do you have any difficulty performing the following activities?:  Driving? {Responses; yes/no (default no):140031::"No"} Managing money?  {Responses; yes/no (default no):140031::"No"} Feeding yourself? {Responses; yes/no (default no):140031::"No"} Getting from bed to chair? {Responses; yes/no (default no):140031::"No"} Climbing a flight of stairs? {Responses; yes/no (default no):140031::"No"} Preparing food and eating?: {yes/no (default no):140031::"No"} Bathing or showering? {Responses; yes/no (default no):140031::"No"} Getting dressed: {yes/no (default no):140031::"No"} Getting to the toilet? {Responses; yes/no (default no):140031::"No"} Using the toilet:{yes/no (default no):140031::"No"} Moving around from place to place: {yes/no (default no):140031::"No"} In the past year have you fallen or had a near fall?:{yes/no (default no):140031::"No"}   Are you sexually active?  {yes/no:20286}  Do you have more than one partner?   {Responses; yes/no (default no):140031::"No"}  Vision Difficulties: {yes/no (default no):140031::"No"}  Hearing Difficulties: {yes/no (default no):140031::"No"} Do you often ask people to speak up or repeat themselves? {Responses; yes/no (default no):140031::"No"} Do you experience ringing or noises in your ears? {Responses; yes/no (default no):140031::"No"} Do you have difficulty understanding soft or whispered voices? {Responses; yes/no (default no):140031::"No"}  Cognition  Do you feel that you have a problem with memory?{Responses; yes/no (default no):140031::"No"}  Do you often misplace items? {Responses; yes/no (default no):140031::"No"}  Do you feel safe at home?  {yes/no:20286}  Advanced directives Does patient have a Health Care Power of Attorney? {yes/no:20286} Does patient have a Living Will? {yes/no:20286}   Objective:     Blood pressure 160/64, pulse 52, temperature 98.2 F (36.8 C), temperature source Temporal, resp. rate 16, height _0  (1.626 m), weight 143 lb (64.864 kg). Body mass index is 24.53 kg/(m^2).  RECHECK 140/70  General appearance: alert, no distress, WD/WN,  female Cognitive Testing  Alert? {yes/no:20286}  Normal Appearance?{yes/no:20286}  Oriented to person? {yes/no:20286}  Place? {yes/no:20286}   Time? {yes/no:20286}  Recall of three objects?  {yes/no:20286}  Can perform simple calculations? {yes/no:20286}  Displays appropriate judgment?{yes/no:20286}  Can read the  correct time from a watch face?{yes/no:20286}  HEENT: normocephalic, sclerae anicteric, TMs pearly, nares patent, no discharge or erythema, pharynx normal Oral cavity: MMM, no lesions Neck: supple, no lymphadenopathy, no thyromegaly, no masses Heart: RRR, normal S1, S2, no murmurs Lungs: CTA bilaterally, no wheezes, rhonchi, or rales Abdomen: +bs, soft, non tender, non distended, no masses, no hepatomegaly, no splenomegaly Musculoskeletal: nontender, no swelling, no obvious  deformity Extremities: no edema, no cyanosis, no clubbing Pulses: 2+ symmetric, upper and lower extremities, normal cap refill Neurological: alert, oriented x 3, CN2-12 intact, strength normal upper extremities and lower extremities, sensation normal throughout, DTRs 2+ throughout, no cerebellar signs, gait normal Psychiatric: normal affect, behavior normal, pleasant  Breast: *** nontender, no masses or lumps, no skin changes, no nipple discharge or inversion, no axillary lymphadenopathy Gyn: defer  Rectal: defer  Medicare Attestation I have personally reviewed: The patient's medical and social history Their use of alcohol, tobacco or illicit drugs Their current medications and supplements The patient's functional ability including ADLs,fall risks, home safety risks, cognitive, and hearing and visual impairment Diet and physical activities Evidence for depression or mood disorders  The patient's weight, height, BMI, and visual acuity have been recorded in the chart.  I have made referrals, counseling, and provided education to the patient based on review of the above and I have provided the patient with a written personalized care plan for preventive services.     Kelby Aline, R, PA-C   11/22/2013

## 2013-11-25 ENCOUNTER — Other Ambulatory Visit: Payer: Self-pay

## 2013-11-25 DIAGNOSIS — E1149 Type 2 diabetes mellitus with other diabetic neurological complication: Secondary | ICD-10-CM

## 2013-11-25 NOTE — Progress Notes (Addendum)
Patient ID: ALEXISS ITURRALDE, female   DOB: 03-13-1930, 80 y.o.   MRN: [id number] Subjective:   CHINWE LOPE is a 80 y.o. female who presents for Medicare Annual Wellness Visit and 3 month follow up on hypertension, diabetes, hyperlipidemia, vitamin D def.  Date of last medicare wellness visit is unknown.   BS running better except with lumbar injections. She does have right side leg numbness on off in leg and thinks from back but uncertain if it could be related to diabetes.   She notes she had tick bite 10/12/13 with significant cellulitis associated infection involving left ear/ face/ neck which has resolved except for remaining scab and minor itching.  Her blood pressure has not been controlled at home, today their BP is BP: 160/64 mmHg She does not workout. She denies chest pain, shortness of breath, dizziness.  She is on cholesterol medication and denies myalgias. Her cholesterol is at goal. The cholesterol last visit was:   Lab Results  Component Value Date   CHOL 145 11/22/2013   HDL 61 11/22/2013   LDLCALC 57 11/22/2013   TRIG 135 11/22/2013   CHOLHDL 2.4 11/22/2013   She has not been working on diet and exercise for diabetes, and denies nausea, polyuria and visual disturbances. Last A1C in the office was:  Lab Results  Component Value Date   HGBA1C 8.9* 11/22/2013   Patient is on Vitamin D supplement.  Names of Other Physician/Practitioners you currently use:  Patient Care Team: [name], MD as PCP - General (Internal Medicine) Josephina Gip, MD as Consulting Physician (Optometry) [name], MD as Consulting Physician (Ophthalmology) Glenna Fellows, MD as Attending Physician (Neurosurgery) Hessie Dibble, MD as Consulting Physician (Orthopedic Surgery) Milus Banister, MD as Attending Physician (Gastroenterology)  Medication Review Current Outpatient Prescriptions on File Prior to Visit  Medication Sig Dispense Refill  . ammonium lactate (LAC-HYDRIN) 12 % lotion  Apply 1 application topically at bedtime. Apply to affected area daily      . aspirin 81 MG tablet Take 81 mg by mouth daily.      . bisoprolol-hydrochlorothiazide (ZIAC) 5-6.25 MG per tablet Take 1 tablet by mouth daily. For BP      . Blood Glucose Monitoring Suppl (ACCU-CHEK AVIVA PLUS) W/DEVICE KIT       . cetirizine (ZYRTEC) 10 MG chewable tablet Chew 10 mg by mouth daily.      . Cholecalciferol (VITAMIN D3) 2000 UNITS TABS Take 1 capsule by mouth 2 (two) times daily.      . fenofibrate micronized (LOFIBRA) 134 MG capsule TAKE ONE CAPSULE BY MOUTH DAILY FOR BLOOD FATS  90 capsule  98  . gabapentin (NEURONTIN) 300 MG capsule TAKE ONE CAPSULE BY MOUTH THREE(3) TO FOUR(4)  TIMES DAILY FOR DIABETIC NEUROPATHY PAIN  360 capsule  2  . Insulin Glargine (LANTUS) 100 UNIT/ML Solostar Pen Inject 30 Units into the skin at bedtime. or as directed to control blood sugars      . insulin lispro (HUMALOG) 100 UNIT/ML injection Inject 5-10 Units into the skin 3 (three) times daily with meals. Patient takes Rx on a sliding scale basis      . Loratadine 10 MG CAPS Take 1 capsule by mouth daily as needed. Allergies      . LORazepam (ATIVAN) 1 MG tablet Take 1 mg by mouth daily as needed for anxiety.       Marland Kitchen losartan (COZAAR) 100 MG tablet TAKE 1 TABLET BY MOUTH DAILY FOR  BLOOD PRESSURE HEART AND KIDNEYS  90 tablet  2  . Magnesium 400 MG CAPS Take 500 mg by mouth 2 (two) times daily.  500 capsule    . meclizine (ANTIVERT) 25 MG tablet Take 25 mg by mouth 3 (three) times daily as needed for dizziness.      . meloxicam (MOBIC) 15 MG tablet Take 7.5 mg by mouth daily. 1/2 to 1 tablet daily with food for pain and inflammation      . metFORMIN (GLUCOPHAGE) 500 MG tablet Take 500 mg by mouth See admin instructions. 1/2 (500 mg ) to 1 tablet (1000 mg) twice daily with food for diabetes      . Multiple Vitamins-Minerals (HAIR/SKIN/NAILS) TABS Take by mouth 2 (two) times daily.      . Omega 3 1000 MG CAPS Take 1 capsule by  mouth daily.       . pravastatin (PRAVACHOL) 40 MG tablet TAKE 1 TABLET BY MOUTH AT BEDTIME FOR CHOLESTEROL      . rOPINIRole (REQUIP) 3 MG tablet Take 3 mg by mouth 3 (three) times daily.      . traMADol (ULTRAM) 50 MG tablet Take 50 mg by mouth every 8 (eight) hours as needed for pain.      . vitamin B-12 (CYANOCOBALAMIN) 1000 MCG tablet Take 1,000 mcg by mouth 2 (two) times daily.      . vitamin C (ASCORBIC ACID) 500 MG tablet Take 500 mg by mouth daily.       No current facility-administered medications on file prior to visit.    Current Problems (verified) Patient Active Problem List   Diagnosis Date Noted  . Encounter for long-term (current) use of other medications 08/06/2013  . T1 IDDM w/Neuropathy 06/04/2013  . T1 IDDM w/nephropathy   . Mixed hyperlipidemia   . Arthritis   . Vitamin D deficiency   . Hypertension 07/29/2012  . Chronic pain 07/29/2012    Screening Tests Health Maintenance  Topic Date Due  . Ophthalmology Exam  01/21/1942  . Urine Microalbumin  01/21/1942  . Tetanus/tdap  01/22/1951  . Zostavax  01/22/1992  . Influenza Vaccine  11/23/2013  . Hemoglobin A1c  05/25/2014  . Foot Exam  11/23/2014  . Colonoscopy  02/18/2015  . Pneumococcal Polysaccharide Vaccine Age 32 And Over  Completed     Immunization History  Administered Date(s) Administered  . DT 01/04/2011  . Influenza Split 01/23/2013  . Pneumococcal Polysaccharide-23 01/06/2012    Preventative care: Last colonoscopy: 2006 Last mammogram: 03/07/13 Last pap smear/pelvic exam: 2012   DEXA:03/17/2006  Prior vaccinations: TD:2012  Influenza: 2014 Pneumococcal: 2013 Shingles/Zostavax:   Past Medical History  Diagnosis Date  . Liver lesion   . Chronic back pain   . Neuropathy   . Diabetic neuropathy   . Wears glasses   . Type II or unspecified type diabetes mellitus without mention of complication, not stated as uncontrolled   . Hypercholesteremia   . Hypertension   . Arthritis    . Vitamin D deficiency    Past Surgical History  Procedure Laterality Date  . Abdominal hysterectomy    . Cesarean section      x 3   . Back surgery  1993    lumb lam-fusion  . Appendectomy    . Colonoscopy    . Eye surgery      both cataracts  . Trigger finger release  01/10/2012    Procedure: RELEASE TRIGGER FINGER/A-1 PULLEY;  Surgeon: Hessie Dibble, MD;  Location: [place];  Service: Orthopedics;  Laterality: Right;  right ring trigger release   History  Substance Use Topics  . Smoking status: Never Smoker   . Smokeless tobacco: Never Used  . Alcohol Use: No   Family History  Problem Relation Age of Onset  . Colon cancer Neg Hx   . Liver disease Sister   . Diabetes Sister     Risk Factors: Osteoporosis: postmenopausal estrogen deficiency History of fracture in the past year: no  Tobacco History  Substance Use Topics  . Smoking status: Never Smoker   . Smokeless tobacco: Never Used  . Alcohol Use: No   She does not smoke.  Patient is not a former smoker. Are there smokers in your home (other than you)?  No  Alcohol Current alcohol use: none  Caffeine Current caffeine use: coffee 1-2 /day  Exercise Exercise limitations: The patient is experiencing exercise intolerance (difficulty climbing 5 stairs). Current exercise: housecleaning  Nutrition/Diet Current diet: in general, a "healthy" diet    Cardiac risk factors: advanced age (older than 29 for men, 39 for women), diabetes mellitus and dyslipidemia.  Depression Screen Nurse depression screen reviewed.  (Note: if answer to either of the following is "Yes", a more complete depression screening is indicated)   Q1: Over the past two weeks, have you felt down, depressed or hopeless? No  Q2: Over the past two weeks, have you felt little interest or pleasure in doing things? No  Have you lost interest or pleasure in daily life? No  Do you often feel hopeless? No  Do you cry easily  over simple problems? No  Activities of Daily Living Nurse ADLs screen reviewed.  In your present state of health, do you have any difficulty performing the following activities?:  Driving? No Managing money?  No Feeding yourself? No Getting from bed to chair? No Climbing a flight of stairs? No Preparing food and eating?: No Bathing or showering? No Getting dressed: No Getting to the toilet? No Using the toilet:No Moving around from place to place: No In the past year have you fallen or had a near fall?:No   Are you sexually active?  No  Do you have more than one partner?  No  Vision Difficulties: No  Hearing Difficulties: No Do you often ask people to speak up or repeat themselves? No Do you experience ringing or noises in your ears? No Do you have difficulty understanding soft or whispered voices? No  Cognition  Do you feel that you have a problem with memory?No  Do you often misplace items? No  Do you feel safe at home?  Yes  Advanced directives Does patient have a [place]? Yes Does patient have a Living Will? Yes    Objective:     Vision and hearing screens reviewed.   Blood pressure 160/64, pulse 52, temperature 98.2 F (36.8 C), temperature source Temporal, resp. rate 16, height $RemoveBe'5\' 4"'[id number]$  (1.626 m), weight 143 lb (64.864 kg). Body mass index is 24.53 kg/(m^2).  General appearance: alert, no distress, WD/WN,  female Cognitive Testing  Alert? Yes  Normal Appearance?Yes  Oriented to person? Yes  Place? Yes   Time? Yes  Recall of three objects?  Yes  Can perform simple calculations? Yes  Displays appropriate judgment?Yes  Can read the correct time from a watch face?Yes  HEENT: normocephalic, sclerae anicteric, TMs pearly, nares patent, no discharge or erythema, pharynx normal Oral cavity: MMM, no lesions Neck: supple,  no lymphadenopathy, no thyromegaly, no masses Heart: RRR, normal S1, S2, no murmurs Lungs: CTA bilaterally, no  wheezes, rhonchi, or rales Abdomen: +bs, soft, non tender, non distended, no masses, no hepatomegaly, no splenomegaly Musculoskeletal: nontender, no swelling, no obvious deformity Extremities: no edema, no cyanosis, no clubbing Pulses: 2+ symmetric, upper and lower extremities, normal cap refill Skin: Thick toe nails , left posterior ear lobe scaling with improvement from tick bite Neurological: alert, oriented x 3, CN2-12 intact, strength normal upper extremities and lower extremities, sensation normal throughout except mild decrease bilateral feet R>L, DTRs 2+ throughout, no cerebellar signs, gait normal Psychiatric: normal affect, behavior normal, pleasant  Breast: def Gyn: def  Rectal: def   Assessment:  1.  3 month F/U for HTN, Cholesterol,DM, D. Deficient. Needs healthy diet, cardio QD and obtain healthy weight. Check Labs, Check BP if >130/80 call office, Check BS if >200 call office  2. Thick nails/ dry skin DM- Advised needs podiatry evaluation with  DM Hx, epsom salt soaks, vaseline treatment QD, monitor QD and call with any concerns/ adverse changes  3. Tick bite f/u from 10/12/13- Improving continue to monitor for any changes or symptoms  4. Back pain with questionable right leg radiation/ neuropathy- Advised continue f/u ORTHO but add water exercise Plan:   During the course of the visit the patient was educated and counseled about appropriate screening and preventive services including:    Bone densitometry screening  Diabetes screening  Nutrition counseling   Screening recommendations, referrals: ALL FOLLOWING UP TO DATE OR DECLINES  Vaccinations: Tdap vaccine no  Influenza vaccine no Pneumococcal vaccine no Shingles vaccine no Hep B vaccine no  Nutrition assessed and recommended  Colonoscopy no Mammogram no Pap smear no Pelvic exam no Recommended yearly ophthalmology/optometry visit for glaucoma screening and checkup Recommended yearly dental visit for  hygiene and checkup Advanced directives - no  Conditions/risks identified: BMI: Discussed weight loss, diet, and increase physical activity.  Increase physical activity: AHA recommends 150 minutes of physical activity a week.  Medications reviewed DEXA- ordered Diabetes is not at goal, ACE/ARB therapy: Yes. Urinary Incontinence is not an issue: discussed non pharmacology and pharmacology options.  Fall risk: low- discussed PT, home fall assessment, medications.   Medicare Attestation I have personally reviewed: The patient's medical and social history Their use of alcohol, tobacco or illicit drugs Their current medications and supplements The patient's functional ability including ADLs,fall risks, home safety risks, cognitive, and hearing and visual impairment Diet and physical activities Evidence for depression or mood disorders  The patient's weight, height, BMI, and visual acuity have been recorded in the chart.  I have made referrals, counseling, and provided education to the patient based on review of the above and I have provided the patient with a written personalized care plan for preventive services.     Zniyah Midkiff, R, PA-C   11/25/2013    Other referrals - social services, PT, meals, transportation, nutrition therapy, weight loss, exercise, falls, tobacco, pap, pelvic CPT [phone] first AWV CPT [phone] subsequent AWV

## 2013-12-11 ENCOUNTER — Ambulatory Visit (INDEPENDENT_AMBULATORY_CARE_PROVIDER_SITE_OTHER): Payer: Medicare Other | Admitting: Podiatry

## 2013-12-11 ENCOUNTER — Encounter: Payer: Self-pay | Admitting: Podiatry

## 2013-12-11 ENCOUNTER — Telehealth (HOSPITAL_COMMUNITY): Payer: Self-pay | Admitting: *Deleted

## 2013-12-11 VITALS — BP 161/70 | HR 50 | Resp 14 | Ht 64.0 in | Wt 142.0 lb

## 2013-12-11 DIAGNOSIS — B351 Tinea unguium: Secondary | ICD-10-CM

## 2013-12-11 DIAGNOSIS — E1059 Type 1 diabetes mellitus with other circulatory complications: Secondary | ICD-10-CM

## 2013-12-11 DIAGNOSIS — R0989 Other specified symptoms and signs involving the circulatory and respiratory systems: Secondary | ICD-10-CM

## 2013-12-11 NOTE — Progress Notes (Signed)
   Subjective:    Patient ID: [name], female    DOB: May 28, 1929, 80 y.o.   MRN: [id number]  HPI Comments: N fungal toenails L left 1, 2nd toenails D and O over 4 to 5 years C thickened, elongated toenails and the 2nd toenail seems loose to the pt A diabetes and difficulty trimming T hx of oral anti-fungal medication about 5 years ago  Pt request diabetic foot exam and trimming of the toenails x 10.     Review of Systems  Musculoskeletal: Positive for back pain.  All other systems reviewed and are negative.      Objective:   Physical Exam  Orientated x3 white female  Vascular: DP pulses 2/4 bilaterally PT pulses 0/4 bilaterally  Neurological: Sensation to 10 g monofilament wire intact 4/5 right and 5/5 left Vibratory sensation intact bilaterally Ankle reflex equal and reactive bilaterally  Dermatological: Surgical scar medial right ankle The toenails are elongated incurvated with maximum hypertrophy and the first and second toenails bilaterally  Musculoskeletal: No deformities noted No restriction ankle, subtalar, midtarsal joints bilaterally       Assessment & Plan:   Assessment: Absent posterior tibial pulses bilaterally rule out peripheral arterial disease Protective is sensation intact bilaterally Onychomycoses  Plan: Debrided toenails 6-10 without a bleeding  Refer patient to vascular lab for a lower extremity arterial Doppler bilaterally for the indication of absent posterior tibial pulses and diabetes.  Notify patient upon receipt of vascular lab

## 2013-12-11 NOTE — Patient Instructions (Signed)
The vascular lab we'll contact you to schedule a lower extremity arterial Doppler  Diabetes and Foot Care Diabetes may cause you to have problems because of poor blood supply (circulation) to your feet and legs. This may cause the skin on your feet to become thinner, break easier, and heal more slowly. Your skin may become dry, and the skin may peel and crack. You may also have nerve damage in your legs and feet causing decreased feeling in them. You may not notice minor injuries to your feet that could lead to infections or more serious problems. Taking care of your feet is one of the most important things you can do for yourself.  HOME CARE INSTRUCTIONS  Wear shoes at all times, even in the house. Do not go barefoot. Bare feet are easily injured.  Check your feet daily for blisters, cuts, and redness. If you cannot see the bottom of your feet, use a mirror or ask someone for help.  Wash your feet with warm water (do not use hot water) and mild soap. Then pat your feet and the areas between your toes until they are completely dry. Do not soak your feet as this can dry your skin.  Apply a moisturizing lotion or petroleum jelly (that does not contain alcohol and is unscented) to the skin on your feet and to dry, brittle toenails. Do not apply lotion between your toes.  Trim your toenails straight across. Do not dig under them or around the cuticle. File the edges of your nails with an emery board or nail file.  Do not cut corns or calluses or try to remove them with medicine.  Wear clean socks or stockings every day. Make sure they are not too tight. Do not wear knee-high stockings since they may decrease blood flow to your legs.  Wear shoes that fit properly and have enough cushioning. To break in new shoes, wear them for just a few hours a day. This prevents you from injuring your feet. Always look in your shoes before you put them on to be sure there are no objects inside.  Do not cross your  legs. This may decrease the blood flow to your feet.  If you find a minor scrape, cut, or break in the skin on your feet, keep it and the skin around it clean and dry. These areas may be cleansed with mild soap and water. Do not cleanse the area with peroxide, alcohol, or iodine.  When you remove an adhesive bandage, be sure not to damage the skin around it.  If you have a wound, look at it several times a day to make sure it is healing.  Do not use heating pads or hot water bottles. They may burn your skin. If you have lost feeling in your feet or legs, you may not know it is happening until it is too late.  Make sure your health care provider performs a complete foot exam at least annually or more often if you have foot problems. Report any cuts, sores, or bruises to your health care provider immediately. SEEK MEDICAL CARE IF:   You have an injury that is not healing.  You have cuts or breaks in the skin.  You have an ingrown nail.  You notice redness on your legs or feet.  You feel burning or tingling in your legs or feet.  You have pain or cramps in your legs and feet.  Your legs or feet are numb.  Your feet  always feel cold. SEEK IMMEDIATE MEDICAL CARE IF:   There is increasing redness, swelling, or pain in or around a wound.  There is a red line that goes up your leg.  Pus is coming from a wound.  You develop a fever or as directed by your health care provider.  You notice a bad smell coming from an ulcer or wound. Document Released: 04/08/2000 Document Revised: 12/12/2012 Document Reviewed: 09/18/2012 [facility] Patient Information 2015 [place], [place]. This information is not intended to replace advice given to you by your health care provider. Make sure you discuss any questions you have with your health care provider.

## 2013-12-16 ENCOUNTER — Telehealth (HOSPITAL_COMMUNITY): Payer: Self-pay | Admitting: *Deleted

## 2013-12-17 ENCOUNTER — Ambulatory Visit: Payer: Self-pay | Admitting: Internal Medicine

## 2013-12-17 ENCOUNTER — Ambulatory Visit (HOSPITAL_COMMUNITY): Payer: Medicare Other

## 2013-12-18 ENCOUNTER — Encounter: Payer: Self-pay | Admitting: Internal Medicine

## 2013-12-18 ENCOUNTER — Ambulatory Visit (INDEPENDENT_AMBULATORY_CARE_PROVIDER_SITE_OTHER): Payer: Medicare Other | Admitting: Internal Medicine

## 2013-12-18 VITALS — BP 160/64 | HR 50 | Temp 98.4°F | Resp 18 | Ht 64.0 in | Wt 146.0 lb

## 2013-12-18 DIAGNOSIS — I150 Renovascular hypertension: Secondary | ICD-10-CM

## 2013-12-18 DIAGNOSIS — Z79899 Other long term (current) drug therapy: Secondary | ICD-10-CM

## 2013-12-18 DIAGNOSIS — E119 Type 2 diabetes mellitus without complications: Secondary | ICD-10-CM

## 2013-12-18 MED ORDER — TRAMADOL HCL 50 MG PO TABS: ORAL_TABLET | ORAL | Status: DC

## 2013-12-18 NOTE — Progress Notes (Signed)
Patient ID: [name], female   DOB: [birth year], 80 y.o.   MRN: [id number]   This very nice 80 y.o.MWF presents for 2 week f/u of abnormal BMET with elevated BUN and creatinine. Lab Results  Component Value Date   CREATININE 1.32* 11/22/2013   BUN 31* 11/22/2013   NA 139 11/22/2013   K 4.7 11/22/2013   CL 103 11/22/2013   CO2 30 11/22/2013  Patient is also followed for with Hypertension, Hyperlipidemia, Pre-Diabetes and Vitamin D Deficiency. Patient also relates that she is seeing Dr Glenna Fellows, neurosurgeon  In [place] and she has received 3 EDSI over the last year w/o benefit.   Patient is treated for HTN & BP has been controlled at home. Today's BP was 160/64 mmHg. Patient denies any cardiac type chest pain, palpitations, dyspnea/orthopnea/PND, dizziness, claudication, or dependent edema.  Hyperlipidemia is controlled with diet & meds. Patient denies myalgias or other med SE's. Last Lipids were  Chol 145; HDL 61; LDL  57; Trig 135 on  11/22/2013.   Also, the patient has history of T1_IDDM and patient denies any symptoms of reactive hypoglycemia, diabetic polys, paresthesias or visual blurring.  Last A1c was  8.9% on  11/22/2013.    Patient has history of Vitamin D Deficiency and patient supplements vitamin D without any suspected side-effects. Last vitamin D was  55 on  08/06/2013.   Medication List   ammonium lactate 12 % lotion  Commonly known as:  LAC-HYDRIN  Apply 1 application topically at bedtime. Apply to affected area daily     aspirin 81 MG tablet  Take 81 mg by mouth daily.     bisoprolol-hydrochlorothiazide 5-6.25 MG per tablet  Commonly known as:  ZIAC  Take 1 tablet by mouth daily. For BP     fenofibrate micronized 134 MG capsule  Commonly known as:  LOFIBRA  TAKE ONE CAPSULE BY MOUTH DAILY FOR BLOOD FATS     gabapentin 300 MG capsule  Commonly known as:  NEURONTIN  TAKE ONE CAPSULE BY MOUTH THREE(3) TO FOUR(4)  TIMES DAILY FOR DIABETIC NEUROPATHY PAIN     HAIR/SKIN/NAILS  Tabs  Take by mouth 2 (two) times daily.     HUMALOG 100 UNIT/ML injection  Generic drug:  insulin lispro  Inject 5-10 Units into the skin 3 (three) times daily with meals. Patient takes Rx on a sliding scale basis     Insulin Glargine 100 UNIT/ML Solostar Pen  Commonly known as:  LANTUS  Inject 30 Units into the skin at bedtime. or as directed to control blood sugars     LORazepam 1 MG tablet  Commonly known as:  ATIVAN  Take 1 mg by mouth daily as needed for anxiety.     losartan 100 MG tablet  Commonly known as:  COZAAR  TAKE 1 TABLET BY MOUTH DAILY FOR BLOOD PRESSURE HEART AND KIDNEYS     Magnesium 500 MG Caps  Take 500 mg by mouth 2 (two) times daily.     meclizine 25 MG tablet  Commonly known as:  ANTIVERT  Take 25 mg by mouth 3 (three) times daily as needed for dizziness.     meloxicam 15 MG tablet  Commonly known as:  MOBIC  Take 7.5 mg by mouth daily. 1/2 to 1 tablet daily with food for pain and inflammation     metFORMIN 500 MG tablet  Commonly known as:  GLUCOPHAGE  Take 500 mg by mouth See admin instructions. 1/2 (500 mg ) to 1  tablet (1000 mg) twice daily with food for diabetes     pravastatin 40 MG tablet  Commonly known as:  PRAVACHOL  TAKE 1 TABLET BY MOUTH AT BEDTIME FOR CHOLESTEROL     rOPINIRole 3 MG tablet  Commonly known as:  REQUIP  Take 3 mg by mouth 3 (three) times daily.     traMADol 50 MG tablet  Commonly known as:  ULTRAM  Take 1/2 to 1 tablet 4 x day or every 4 hours if needed for severe pain     Vitamin D3 2000 UNITS Tabs  Take 1 capsule by mouth 2 (two) times daily.     Allergies  Allergen Reactions  . Morphine And Related Other (See Comments)    fuzzy feeling   PMHx:   Past Medical History  Diagnosis Date  . Liver lesion   . Chronic back pain   . Neuropathy   . Diabetic neuropathy   . Wears glasses   . Type II or unspecified type diabetes mellitus without mention of complication, not stated as uncontrolled   .  Hypercholesteremia   . Hypertension   . Arthritis   . Vitamin D deficiency    FHx:    Reviewed / unchanged SHx:    Reviewed / unchanged  Systems Review:  Constitutional: Denies fever, chills, wt changes, headaches, insomnia, fatigue, night sweats, change in appetite. Eyes: Denies redness, blurred vision, diplopia, discharge, itchy, watery eyes.  ENT: Denies discharge, congestion, post nasal drip, epistaxis, sore throat, earache, hearing loss, dental pain, tinnitus, vertigo, sinus pain, snoring.  CV: Denies chest pain, palpitations, irregular heartbeat, syncope, dyspnea, diaphoresis, orthopnea, PND, claudication or edema. Respiratory: denies cough, dyspnea, DOE, pleurisy, hoarseness, laryngitis, wheezing.  Gastrointestinal: Denies dysphagia, odynophagia, heartburn, reflux, water brash, abdominal pain or cramps, nausea, vomiting, bloating, diarrhea, constipation, hematemesis, melena, hematochezia  or hemorrhoids. Genitourinary: Denies dysuria, frequency, urgency, nocturia, hesitancy, discharge, hematuria or flank pain. Musculoskeletal: Denies arthralgias, myalgias, stiffness, jt. swelling, pain, limping or strain/sprain.  Skin: Denies pruritus, rash, hives, warts, acne, eczema or change in skin lesion(s). Neuro: No weakness, tremor, incoordination, spasms, paresthesia or pain. Psychiatric: Denies confusion, memory loss or sensory loss. Endo: Denies change in weight, skin or hair change.  Heme/Lymph: No excessive bleeding, bruising or enlarged lymph nodes.  Exam:  BP 160/64  Pulse 50  Temp(Src) 98.4 F (36.9 C) (Temporal)  Resp 18  Ht 5\' 4"  (1.626 m)  Wt 146 lb (66.225 kg)  BMI 25.05 kg/m2  Appears well nourished and in no distress. Eyes: PERRLA, EOMs, conjunctiva no swelling or erythema. Sinuses: No frontal/maxillary tenderness ENT/Mouth: EAC's clear, TM's nl w/o erythema, bulging. Nares clear w/o erythema, swelling, exudates. Oropharynx clear without erythema or exudates. Oral  hygiene is good. Tongue normal, non obstructing. Hearing intact.  Neck: Supple. Thyroid nl. Car 2+/2+ without bruits, nodes or JVD. Chest: Respirations nl with BS clear & equal w/o rales, rhonchi, wheezing or stridor.  Cor: Heart sounds normal w/ regular rate and rhythm without sig. murmurs, gallops, clicks, or rubs. Peripheral pulses normal and equal  without edema.  Abdomen: Soft & bowel sounds normal. Non-tender w/o guarding, rebound, hernias, masses, or organomegaly.  Lymphatics: Unremarkable.  Musculoskeletal: Full ROM all peripheral extremities, joint stability, 5/5 strength, and normal gait.  Skin: Warm, dry without exposed rashes, lesions or ecchymosis apparent.  Neuro: Cranial nerves intact, reflexes equal bilaterally. Sensory-motor testing grossly intact. Tendon reflexes grossly intact.  Pysch: Alert & oriented x 3.  Insight and judgement nl & appropriate. No ideations.  Assessment and Plan:  1. Hypertension - Continue monitor blood pressure at home. Continue diet/meds same.  2. Hyperlipidemia - Continue diet/meds, exercise,& lifestyle modifications. Continue monitor periodic cholesterol/liver & renal functions   3. T1_IDDM -    4. Vitamin D Deficiency - Continue supplementation.  Recommended regular exercise, BP monitoring, weight control, and discussed med and SE's. Recommended labs to assess and monitor clinical status. Further disposition pending results of labs.

## 2013-12-18 NOTE — Patient Instructions (Signed)

## 2013-12-19 LAB — BASIC METABOLIC PANEL WITH GFR
BUN: 24 mg/dL — ABNORMAL HIGH (ref 6–23)
CO2: 27 mEq/L (ref 19–32)
Calcium: 9.6 mg/dL (ref 8.4–10.5)
Chloride: 103 mEq/L (ref 96–112)
Creat: 1.16 mg/dL — ABNORMAL HIGH (ref 0.50–1.10)
GFR, Est African American: 51 mL/min — ABNORMAL LOW
GFR, Est Non African American: 44 mL/min — ABNORMAL LOW
Glucose, Bld: 291 mg/dL — ABNORMAL HIGH (ref 70–99)
Potassium: 5.2 mEq/L (ref 3.5–5.3)
Sodium: 137 mEq/L (ref 135–145)

## 2013-12-24 ENCOUNTER — Other Ambulatory Visit: Payer: Self-pay | Admitting: Internal Medicine

## 2013-12-25 ENCOUNTER — Telehealth (HOSPITAL_COMMUNITY): Payer: Self-pay | Admitting: *Deleted

## 2014-01-13 ENCOUNTER — Other Ambulatory Visit: Payer: Self-pay | Admitting: Physician Assistant

## 2014-01-16 ENCOUNTER — Other Ambulatory Visit: Payer: Self-pay | Admitting: Internal Medicine

## 2014-01-17 ENCOUNTER — Encounter: Payer: Self-pay | Admitting: Internal Medicine

## 2014-01-28 ENCOUNTER — Other Ambulatory Visit: Payer: Self-pay | Admitting: *Deleted

## 2014-01-28 MED ORDER — INSULIN GLARGINE 100 UNIT/ML SOLOSTAR PEN: 30.0000 [IU] | PEN_INJECTOR | Freq: Every day | SUBCUTANEOUS | Status: DC

## 2014-02-05 ENCOUNTER — Other Ambulatory Visit: Payer: Self-pay

## 2014-02-05 DIAGNOSIS — Z1231 Encounter for screening mammogram for malignant neoplasm of breast: Secondary | ICD-10-CM

## 2014-02-20 ENCOUNTER — Other Ambulatory Visit: Payer: Self-pay | Admitting: Internal Medicine

## 2014-02-27 ENCOUNTER — Ambulatory Visit (INDEPENDENT_AMBULATORY_CARE_PROVIDER_SITE_OTHER): Payer: Medicare Other | Admitting: Internal Medicine

## 2014-02-27 ENCOUNTER — Encounter: Payer: Self-pay | Admitting: Internal Medicine

## 2014-02-27 ENCOUNTER — Telehealth: Payer: Self-pay | Admitting: *Deleted

## 2014-02-27 VITALS — BP 122/60 | HR 52 | Temp 98.6°F | Resp 18 | Ht 64.0 in | Wt 144.2 lb

## 2014-02-27 DIAGNOSIS — Z1331 Encounter for screening for depression: Secondary | ICD-10-CM

## 2014-02-27 DIAGNOSIS — I152 Hypertension secondary to endocrine disorders: Secondary | ICD-10-CM

## 2014-02-27 DIAGNOSIS — Z1212 Encounter for screening for malignant neoplasm of rectum: Secondary | ICD-10-CM

## 2014-02-27 DIAGNOSIS — E782 Mixed hyperlipidemia: Secondary | ICD-10-CM

## 2014-02-27 DIAGNOSIS — E559 Vitamin D deficiency, unspecified: Secondary | ICD-10-CM

## 2014-02-27 DIAGNOSIS — E1040 Type 1 diabetes mellitus with diabetic neuropathy, unspecified: Secondary | ICD-10-CM

## 2014-02-27 DIAGNOSIS — I150 Renovascular hypertension: Secondary | ICD-10-CM

## 2014-02-27 DIAGNOSIS — Z79899 Other long term (current) drug therapy: Secondary | ICD-10-CM

## 2014-02-27 DIAGNOSIS — Z0001 Encounter for general adult medical examination with abnormal findings: Secondary | ICD-10-CM

## 2014-02-27 DIAGNOSIS — Z9181 History of falling: Secondary | ICD-10-CM

## 2014-02-27 DIAGNOSIS — Z23 Encounter for immunization: Secondary | ICD-10-CM

## 2014-02-27 DIAGNOSIS — E1029 Type 1 diabetes mellitus with other diabetic kidney complication: Secondary | ICD-10-CM

## 2014-02-27 DIAGNOSIS — R6889 Other general symptoms and signs: Secondary | ICD-10-CM

## 2014-02-27 LAB — CBC WITH DIFFERENTIAL/PLATELET
Basophils Absolute: 0 10*3/uL (ref 0.0–0.1)
Basophils Relative: 0 % (ref 0–1)
Eosinophils Absolute: 0.5 10*3/uL (ref 0.0–0.7)
Eosinophils Relative: 6 % — ABNORMAL HIGH (ref 0–5)
HCT: 36.4 % (ref 36.0–46.0)
Hemoglobin: 12.1 g/dL (ref 12.0–15.0)
Lymphocytes Relative: 33 % (ref 12–46)
Lymphs Abs: 2.7 10*3/uL (ref 0.7–4.0)
MCH: 30.6 pg (ref 26.0–34.0)
MCHC: 33.2 g/dL (ref 30.0–36.0)
MCV: 92.2 fL (ref 78.0–100.0)
Monocytes Absolute: 1 10*3/uL (ref 0.1–1.0)
Monocytes Relative: 12 % (ref 3–12)
Neutro Abs: 4 10*3/uL (ref 1.7–7.7)
Neutrophils Relative %: 49 % (ref 43–77)
Platelets: 271 10*3/uL (ref 150–400)
RBC: 3.95 MIL/uL (ref 3.87–5.11)
RDW: 13.3 % (ref 11.5–15.5)
WBC: 8.2 10*3/uL (ref 4.0–10.5)

## 2014-02-27 NOTE — Patient Instructions (Signed)

## 2014-02-27 NOTE — Telephone Encounter (Signed)
Error, no call.

## 2014-02-27 NOTE — Progress Notes (Signed)
Patient ID: [name], female   DOB: [birth year], 80 y.o.   MRN: [id number]  Annual Screening Comprehensive Examination  This very nice 80 y.o.MWF presents for complete physical.  Patient has been followed for HTN, T2_NIDDM, Hyperlipidemia, and Vitamin D Deficiency. Patient has remote hx/o back surg in  1995 by Dr Carloyn Manner - now in [place]. She's been developing progressive LBP and has had several EDSI w/o significant benefit and is contemplating surg by Dr Carloyn Manner.    HTN predates since 2000. [name] BP has been controlled at home and patient denies any cardiac symptoms as chest pain, palpitations, shortness of breath, dizziness or ankle swelling. Patient did have a negative Cardiolite in  Today's BP: 122/60 mmHg    [name] hyperlipidemia is controlled with diet and medications. Patient denies myalgias or other medication SE's. Last lipids were at goal - Total Chol 145; HDL  61; LDL 57; Trig 135 on 11/22/2013.   Patient has T2_NIDDM since 1996 and was on pills until 2000 starting insulin and now T1DM.Marland Kitchen predating since  and patient denies reactive hypoglycemic symptoms, visual blurring, diabetic polys, or paresthesias.  Patient has been on Lantus with CBG's usu less than 130's. On infreq occasions, she covers with SS Humalog. She is very concerned over the cost of her insulins. Last A1c was 8.9%    Finally, patient has history of Vitamin D Deficiency and last Vitamin D was  55 on 08/06/2013.  Medication Sig  . ammonium lactate (LAC-HYDRIN) 12 % lotion Apply 1 application topically at bedtime. Apply to affected area daily  . aspirin 81 MG tablet Take 81 mg by mouth daily.  . bisoprolol-hctz 5-6.25 MG per tablet Take 1 tablet by mouth daily. For BP  . VITAMIN D 2000 UNITS TABS Take 1 capsule by mouth 2 (two) times daily.  . fenofibrate 134 MG capsule TAKE ONE CAPSULE BY MOUTH DAILY FOR BLOOD FATS  . gabapentin 300 MG capsule ONE CAP 3-4  TIMES DAILY FOR DIABETIC NEUROPATHY PAIN  . Insulin Glargine  (LANTUS)  Solostar  Inject 30 Units into the skin at bedtime. or as directed to control blood sugars  . insulin lispro (HUMALOG) 100 UNIT/ML injection Inject 5-10 Units into the skin 3  times daily with meals. Patient takes Rx on a sliding scale basis  . losartan  100 MG tablet TAKE 1 TABLET DAILY  . Magnesium 500 MG CAPS Take 500 mg  2 times daily.  . meloxicam  15 MG tablet Take 7.5 mg by mouth daily. 1/2 to 1 tablet daily with food for pain and inflammation  . metFORMIN  1000  TAKE ONE TABLET TWO TIMES A DAY AFTER MEALS  .  HAIR/SKIN/NAILS Take 2  times daily.  . pravastatin  40 MG tablet TAKE 1 TABLET BY MOUTH AT BEDTIME  . rOPINIRole  3 MG tablet TAKE 1 TAB TWICE DAILY AND TWO TABLETS AT BEDTIME FOR RESTLESS  LEGS  . traMADol (ULTRAM) 50 MG tablet Take 1/2 to 1 tablet 4 x day or every 4 hours if needed for severe pain   Allergies  Allergen Reactions  . Morphine And Related Other (See Comments)    fuzzy feeling   Past Medical History  Diagnosis Date  . Liver lesion   . Chronic back pain   . Neuropathy   . Diabetic neuropathy   . Wears glasses   . Type II or unspecified type diabetes mellitus without mention of complication, not stated as uncontrolled   . Hypercholesteremia   .  Hypertension   . Arthritis   . Vitamin D deficiency    Health Maintenance  Topic Date Due  . OPHTHALMOLOGY EXAM  [birth year]/21/1942  . URINE MICROALBUMIN  01/21/1942  . TETANUS/TDAP  01/22/1951  . ZOSTAVAX  01/22/1992  . INFLUENZA VACCINE  11/23/2013  . HEMOGLOBIN A1C  05/25/2014  . FOOT EXAM  11/23/2014  . COLONOSCOPY  02/18/2015  . PNEUMOCOCCAL POLYSACCHARIDE VACCINE AGE 48 AND OVER  Completed   Immunization History  Administered Date(s) Administered  . DT 01/04/2011  . Influenza Split 01/23/2013  . Influenza, High Dose Seasonal PF 02/27/2014  . Pneumococcal Conjugate-13 02/27/2014  . Pneumococcal Polysaccharide-23 01/06/2012   Past Surgical History  Procedure Laterality Date  . Abdominal  hysterectomy    . Cesarean section      x 3   . Back surgery  1993    lumb lam-fusion  . Appendectomy    . Colonoscopy    . Eye surgery      both cataracts  . Trigger finger release  01/10/2012    Procedure: RELEASE TRIGGER FINGER/A-1 PULLEY;  Surgeon: Hessie Dibble, MD;  Location: [facility];  Service: Orthopedics;  Laterality: Right;  right ring trigger release   Family History  Problem Relation Age of Onset  . Colon cancer Neg Hx   . Liver disease Sister   . Diabetes Sister    History  Substance Use Topics  . Smoking status: Never Smoker   . Smokeless tobacco: Never Used  . Alcohol Use: No     ROS Constitutional: Denies fever, chills, weight loss/gain, headaches, insomnia, fatigue, night sweats, and change in appetite. Eyes: Denies redness, blurred vision, diplopia, discharge, itchy, watery eyes.  ENT: Denies discharge, congestion, post nasal drip, epistaxis, sore throat, earache, hearing loss, dental pain, Tinnitus, Vertigo, Sinus pain, snoring.  Cardio: Denies chest pain, palpitations, irregular heartbeat, syncope, dyspnea, diaphoresis, orthopnea, PND, claudication, edema Respiratory: denies cough, dyspnea, DOE, pleurisy, hoarseness, laryngitis, wheezing.  Gastrointestinal: Denies dysphagia, heartburn, reflux, water brash, pain, cramps, nausea, vomiting, bloating, diarrhea, constipation, hematemesis, melena, hematochezia, jaundice, hemorrhoids Genitourinary: Denies dysuria, frequency, urgency, nocturia, hesitancy, discharge, hematuria, flank pain Breast: Breast lumps, nipple discharge, bleeding.  Musculoskeletal: Denies arthralgia, myalgia, stiffness, Jt. Swelling, pain, limp, and strain/sprain. Denies falls. Skin: Denies puritis, rash, hives, warts, acne, eczema, changing in skin lesion Neuro: No weakness, tremor, incoordination, spasms, paresthesia, pain Psychiatric: Denies confusion, memory loss, sensory loss. Denies Depression. Endocrine: Denies change  in weight, skin, hair change, nocturia, and paresthesia, diabetic polys, visual blurring, hyper / hypo glycemic episodes.  Heme/Lymph: No excessive bleeding, bruising, enlarged lymph nodes.  Physical Exam  BP 122/60  Pulse 52  Temp 98.6 F   Resp 18  Ht 5\' 4"    Wt 144 lb 3.2 oz   BMI 24.74   General Appearance: Well nourished and in no apparent distress. Eyes: PERRLA, EOMs, conjunctiva no swelling or erythema, normal fundi and vessels. Sinuses: No frontal/maxillary tenderness ENT/Mouth: EACs patent / TMs  nl. Nares clear without erythema, swelling, mucoid exudates. Oral hygiene is good. No erythema, swelling, or exudate. Tongue normal, non-obstructing. Tonsils not swollen or erythematous. Hearing normal.  Neck: Supple, thyroid normal. No bruits, nodes or JVD. Respiratory: Respiratory effort normal.  BS equal and clear bilateral without rales, rhonci, wheezing or stridor. Cardio: Heart sounds are normal with regular rate and rhythm and no murmurs, rubs or gallops. Peripheral pulses are normal and equal bilaterally without edema. No aortic or femoral bruits. Chest: symmetric with normal excursions  and percussion. Breasts: Symmetric, without lumps, nipple discharge, retractions, or fibrocystic changes.  Abdomen: Flat, soft, with bowl sounds. Nontender, no guarding, rebound, hernias, masses, or organomegaly.  Lymphatics: Non tender without lymphadenopathy.  Genitourinary:  Musculoskeletal: Full ROM all peripheral extremities, joint stability, 5/5 strength, and normal gait. Skin: Warm and dry without rashes, lesions, cyanosis, clubbing or  ecchymosis.  Neuro: Cranial nerves intact, reflexes 1(+) equal in UE, but absent in LE's. . Normal muscle tone, no cerebellar symptoms. Sensation sl decreased distally by monofilament ina distal pattern to the proximal days. Pysch: Awake and oriented X 3, normal affect, Insight and Judgment appropriate.   Assessment and Plan  1. Annual Screening  Examination 2. Hypertension  3. Hyperlipidemia 4. Pre Diabetes 5. Vitamin D Deficiency 6. Chronic LBP due to DDD.    Continue prudent diet as discussed, weight control, BP monitoring, regular exercise, and medications. Discussed med's effects and SE's. Screening labs and tests as requested with regular follow-up as recommended.

## 2014-02-28 LAB — BASIC METABOLIC PANEL WITH GFR
BUN: 22 mg/dL (ref 6–23)
CO2: 28 mEq/L (ref 19–32)
Calcium: 9.8 mg/dL (ref 8.4–10.5)
Chloride: 102 mEq/L (ref 96–112)
Creat: 1.23 mg/dL — ABNORMAL HIGH (ref 0.50–1.10)
GFR, Est African American: 47 mL/min — ABNORMAL LOW
GFR, Est Non African American: 41 mL/min — ABNORMAL LOW
Glucose, Bld: 255 mg/dL — ABNORMAL HIGH (ref 70–99)
Potassium: 4.8 mEq/L (ref 3.5–5.3)
Sodium: 140 mEq/L (ref 135–145)

## 2014-02-28 LAB — URINALYSIS, MICROSCOPIC ONLY
Bacteria, UA: NONE SEEN
Casts: NONE SEEN
Crystals: NONE SEEN
Squamous Epithelial / LPF: NONE SEEN

## 2014-02-28 LAB — LIPID PANEL
Cholesterol: 149 mg/dL (ref 0–200)
HDL: 56 mg/dL (ref >39)
LDL Cholesterol: 69 mg/dL (ref 0–99)
Total CHOL/HDL Ratio: 2.7 Ratio
Triglycerides: 120 mg/dL (ref <150)
VLDL: 24 mg/dL (ref 0–40)

## 2014-02-28 LAB — MAGNESIUM: Magnesium: 1.8 mg/dL (ref 1.5–2.5)

## 2014-02-28 LAB — MICROALBUMIN / CREATININE URINE RATIO
Creatinine, Urine: 144 mg/dL
Microalb Creat Ratio: 4.2 mg/g (ref 0.0–30.0)
Microalb, Ur: 0.6 mg/dL (ref <2.0)

## 2014-02-28 LAB — HEPATIC FUNCTION PANEL
ALT: 10 U/L (ref 0–35)
AST: 18 U/L (ref 0–37)
Albumin: 4 g/dL (ref 3.5–5.2)
Alkaline Phosphatase: 52 U/L (ref 39–117)
Bilirubin, Direct: 0.1 mg/dL (ref 0.0–0.3)
Indirect Bilirubin: 0.2 mg/dL (ref 0.2–1.2)
Total Bilirubin: 0.3 mg/dL (ref 0.2–1.2)
Total Protein: 7 g/dL (ref 6.0–8.3)

## 2014-02-28 LAB — INSULIN, FASTING: Insulin fasting, serum: 51.3 u[IU]/mL — ABNORMAL HIGH (ref 2.0–19.6)

## 2014-02-28 LAB — HEMOGLOBIN A1C
Hgb A1c MFr Bld: 8.1 % — ABNORMAL HIGH (ref <5.7)
Mean Plasma Glucose: 186 mg/dL — ABNORMAL HIGH (ref <117)

## 2014-02-28 LAB — VITAMIN D 25 HYDROXY (VIT D DEFICIENCY, FRACTURES): Vit D, 25-Hydroxy: 43 ng/mL (ref 30–89)

## 2014-02-28 LAB — TSH: TSH: 1.696 u[IU]/mL (ref 0.350–4.500)

## 2014-03-11 ENCOUNTER — Other Ambulatory Visit: Payer: Self-pay | Admitting: Neurosurgery

## 2014-03-11 ENCOUNTER — Ambulatory Visit
Admission: RE | Admit: 2014-03-11 | Discharge: 2014-03-11 | Disposition: A | Discharge status: Home / Self Care | Payer: Medicare Other | Source: Ambulatory Visit

## 2014-03-11 DIAGNOSIS — M47816 Spondylosis without myelopathy or radiculopathy, lumbar region: Secondary | ICD-10-CM

## 2014-03-11 DIAGNOSIS — Z1231 Encounter for screening mammogram for malignant neoplasm of breast: Secondary | ICD-10-CM

## 2014-03-12 ENCOUNTER — Other Ambulatory Visit (INDEPENDENT_AMBULATORY_CARE_PROVIDER_SITE_OTHER): Payer: Medicare Other | Admitting: *Deleted

## 2014-03-12 DIAGNOSIS — Z1212 Encounter for screening for malignant neoplasm of rectum: Secondary | ICD-10-CM

## 2014-03-12 LAB — POC HEMOCCULT BLD/STL (HOME/3-CARD/SCREEN)
Card #2 Fecal Occult Blod, POC: NEGATIVE
Card #3 Fecal Occult Blood, POC: NEGATIVE
Fecal Occult Blood, POC: NEGATIVE

## 2014-03-16 ENCOUNTER — Ambulatory Visit
Admission: RE | Admit: 2014-03-16 | Discharge: 2014-03-16 | Disposition: A | Discharge status: Home / Self Care | Payer: Medicare Other | Source: Ambulatory Visit | Attending: Neurosurgery | Admitting: Neurosurgery

## 2014-03-16 DIAGNOSIS — M47816 Spondylosis without myelopathy or radiculopathy, lumbar region: Secondary | ICD-10-CM

## 2014-03-17 ENCOUNTER — Other Ambulatory Visit: Payer: Self-pay | Admitting: *Deleted

## 2014-03-17 MED ORDER — LOSARTAN POTASSIUM 100 MG PO TABS: ORAL_TABLET | ORAL | Status: DC

## 2014-03-19 ENCOUNTER — Ambulatory Visit: Payer: Medicare Other | Admitting: Podiatry

## 2014-04-04 ENCOUNTER — Other Ambulatory Visit: Payer: Self-pay

## 2014-04-04 MED ORDER — ROPINIROLE HCL 3 MG PO TABS: ORAL_TABLET | ORAL | Status: DC

## 2014-06-06 ENCOUNTER — Ambulatory Visit (INDEPENDENT_AMBULATORY_CARE_PROVIDER_SITE_OTHER): Payer: Medicare Other | Admitting: Physician Assistant

## 2014-06-06 ENCOUNTER — Ambulatory Visit: Payer: Self-pay | Admitting: Physician Assistant

## 2014-06-06 ENCOUNTER — Encounter: Payer: Self-pay | Admitting: Physician Assistant

## 2014-06-06 VITALS — BP 140/58 | HR 56 | Temp 98.1°F | Resp 16 | Wt 141.8 lb

## 2014-06-06 DIAGNOSIS — G8929 Other chronic pain: Secondary | ICD-10-CM | POA: Diagnosis not present

## 2014-06-06 DIAGNOSIS — R519 Headache, unspecified: Secondary | ICD-10-CM

## 2014-06-06 DIAGNOSIS — E1029 Type 1 diabetes mellitus with other diabetic kidney complication: Secondary | ICD-10-CM | POA: Diagnosis not present

## 2014-06-06 DIAGNOSIS — R51 Headache: Secondary | ICD-10-CM | POA: Diagnosis not present

## 2014-06-06 NOTE — Progress Notes (Signed)
Assessment and Plan: 1. DM (diabetes mellitus) type I controlled with renal manifestation Get back on lantus (samples given) and humolog, sliding scale given, discussed hypoglycemia and what to do.   2. Chronic pain Leg weakness likely due to spinal stenosis however DM neuropathy a possiblity, information given, has follow up with Dr. Carloyn Manner next week, get better control of sugars  3. Nonintractable headache, unspecified chronicity pattern, unspecified headache type Cervical DJD/ Cervical radiculopathy- normal neuro and no TMJ tenderness-? From neck Continue present treatment and plan., Lie in darkened room and apply cold packs as needed for pain., Patient reassured that neurodiagnostic workup not indicated from benign H&P., discuss injection with Dr. Carloyn Manner.   Future Appointments Date Time Provider [place]  09/18/2014 10:30 AM [name], MD [facility] None  03/11/2015 10:00 AM [name], MD [facility] None     HPI 81 y.o.female presents for 65monthfollow up from CPE. Her insulin was switched last time to suppose to be cheaper insulin however she states that it was the same price and that her sugars were worse so she has gone back to the humalog sliding scale as needed and lantus 25 units. Sugars have been running well, only once it has been in the 200's, lowest is 90's a few times.   She complains of headaches, ibuprofen and rest helps. She follows up with Dr. RZadie Rhinenext week for Macular Degeneration. Normal Ct head/MRI head 2014.   Complains of bilateral leg numbness, had back surgery Dec 1st with Dr. RCarloyn Mannerdue to ruptured disk. + spinal stenosis via recent lumbar MRI.   Past Medical History  Diagnosis Date  . Liver lesion   . Chronic back pain   . Neuropathy   . Diabetic neuropathy   . Wears glasses   . Type II or unspecified type diabetes mellitus without mention of complication, not stated as uncontrolled   . Hypercholesteremia   . Hypertension   . Arthritis   .  Vitamin D deficiency      Allergies  Allergen Reactions  . Morphine And Related Other (See Comments)    fuzzy feeling     Current Outpatient Prescriptions on File Prior to Visit  Medication Sig Dispense Refill  . aspirin 81 MG tablet Take 81 mg by mouth daily.    . bisoprolol-hydrochlorothiazide (ZIAC) 5-6.25 MG per tablet Take 1 tablet by mouth daily. For BP    . Blood Glucose Monitoring Suppl (ACCU-CHEK AVIVA PLUS) W/DEVICE KIT     . Cholecalciferol (VITAMIN D3) 2000 UNITS TABS Take 1 capsule by mouth 2 (two) times daily.    . fenofibrate micronized (LOFIBRA) 134 MG capsule TAKE ONE CAPSULE BY MOUTH DAILY FOR BLOOD FATS 90 capsule 98  . gabapentin (NEURONTIN) 300 MG capsule TAKE ONE CAPSULE BY MOUTH THREE(3) TO FOUR(4)  TIMES DAILY FOR DIABETIC NEUROPATHY PAIN 360 capsule 2  . Insulin Glargine (LANTUS) 100 UNIT/ML Solostar Pen Inject 30 Units into the skin at bedtime. or as directed to control blood sugars 15 mL 0  . insulin lispro (HUMALOG) 100 UNIT/ML injection Inject 5-10 Units into the skin 3 (three) times daily with meals. Patient takes Rx on a sliding scale basis    . losartan (COZAAR) 100 MG tablet TAKE 1 TABLET BY MOUTH DAILY FOR BLOOD PRESSURE HEART AND KIDNEYS 90 tablet 2  . Magnesium 500 MG CAPS Take 500 mg by mouth 2 (two) times daily.    . meloxicam (MOBIC) 15 MG tablet Take 7.5 mg by mouth daily. 1/2 to  1 tablet daily with food for pain and inflammation    . metFORMIN (GLUCOPHAGE) 1000 MG tablet TAKE ONE TABLET TWO TIMES A DAY AFTER MEALS FOR DIABETES 60 tablet 2  . Multiple Vitamins-Minerals (HAIR/SKIN/NAILS) TABS Take by mouth 2 (two) times daily.    . pravastatin (PRAVACHOL) 40 MG tablet TAKE 1 TABLET BY MOUTH AT BEDTIME FOR CHOLESTEROL 90 tablet 1  . rOPINIRole (REQUIP) 3 MG tablet TAKE 1 TABLET BY MOUTH TWICE DAILY AND TAKE TWO TABLETS AT BEDTIME FOR RESTLESS  LEGS 120 tablet 3  . traMADol (ULTRAM) 50 MG tablet Take 1/2 to 1 tablet 4 x day or every 4 hours if needed for  severe pain 100 tablet 0   No current facility-administered medications on file prior to visit.    ROS: all negative except above.   Physical Exam: Filed Weights   06/06/14 1316  Weight: 141 lb 12.8 oz (64.32 kg)   BP 140/58 mmHg  Pulse 56  Temp(Src) 98.1 F (36.7 C) (Temporal)  Resp 16  Wt 141 lb 12.8 oz (64.32 kg) General Appearance: Well nourished, in no apparent distress. Eyes: PERRLA, EOMs, conjunctiva no swelling or erythema Sinuses: No Frontal/maxillary tenderness ENT/Mouth: Ext aud canals clear, TMs without erythema, bulging. No erythema, swelling, or exudate on post pharynx.  Tonsils not swollen or erythematous. Hearing normal.  Neck: Supple, thyroid normal.  Respiratory: Respiratory effort normal, BS equal bilaterally without rales, rhonchi, wheezing or stridor.  Cardio: RRR with no MRGs. Brisk peripheral pulses without edema.  Abdomen: Soft, + BS.  Non tender, no guarding, rebound, hernias, masses. Lymphatics: Non tender without lymphadenopathy.  Musculoskeletal: Full ROM, 4/5 strength, normal gait.  Skin: Warm, dry without rashes, lesions, ecchymosis.  Neuro: Cranial nerves intact. Normal muscle tone, no cerebellar symptoms. Sensation decreased bilateral feet.  Psych: Awake and oriented X 3, normal affect, Insight and Judgment appropriate.     Vicie Mutters, PA-C 1:16 PM [facility]

## 2014-06-06 NOTE — Patient Instructions (Addendum)
Spinal Stenosis Spinal stenosis is an abnormal narrowing of the canals of your spine (vertebrae). CAUSES  Spinal stenosis is caused by areas of bone pushing into the central canals of your vertebrae. This condition can be present at birth (congenital). It also may be caused by arthritic deterioration of your vertebrae (spinal degeneration).  SYMPTOMS   Pain that is generally worse with activities, particularly standing and walking.  Numbness, tingling, hot or cold sensations, weakness, or weariness in your legs.  Frequent episodes of falling.  A foot-slapping gait that leads to muscle weakness. DIAGNOSIS  Spinal stenosis is diagnosed with the use of magnetic resonance imaging (MRI) or computed tomography (CT). TREATMENT  Initial therapy for spinal stenosis focuses on the management of the pain and other symptoms associated with the condition. These therapies include:  Practicing postural changes to lessen pressure on your nerves.  Exercises to strengthen the core of your body.  Loss of excess body weight.  The use of nonsteroidal anti-inflammatory medicines to reduce swelling and inflammation in your nerves. When therapies to manage pain are not successful, surgery to treat spinal stenosis may be recommended. This surgery involves removing excess bone, which puts pressure on your nerve roots. During this surgery (laminectomy), the posterior boney arch (lamina) and excess bone around the facet joints are removed. Document Released: 07/02/2003 Document Revised: 08/26/2013 Document Reviewed: 07/20/2012 [facility] Patient Information 2015 [place], [place]. This information is not intended to replace advice given to you by your health care provider. Make sure you discuss any questions you have with your health care provider.   Sugar Insulin  <70  NONE  70-130 None  131-180 2 units  181-240 4 unit  241-300 6 units  301-350 8 units  351-400 10 units and call the doctor

## 2014-06-10 ENCOUNTER — Telehealth: Payer: Self-pay | Admitting: *Deleted

## 2014-06-10 ENCOUNTER — Other Ambulatory Visit: Payer: Self-pay | Admitting: Internal Medicine

## 2014-06-11 NOTE — Telephone Encounter (Signed)
Patient called with concerns of elevated BP readings.  States BP was 177/71 at 10:00am and 165/63 at 11:00am  With c/o headache.  Per Dr. Idell Pickles orders advised patient to take an additional Losartan pill today only and to increase Ziac 5 mg to two pills daily = 10 mg and continue to monitor BP.  If still elevated or headache continues, advised patient to call for ov for further eval and treatment.

## 2014-06-12 DIAGNOSIS — H35051 Retinal neovascularization, unspecified, right eye: Secondary | ICD-10-CM | POA: Diagnosis not present

## 2014-06-12 DIAGNOSIS — H3532 Exudative age-related macular degeneration: Secondary | ICD-10-CM | POA: Diagnosis not present

## 2014-06-19 ENCOUNTER — Other Ambulatory Visit: Payer: Self-pay | Admitting: Internal Medicine

## 2014-07-22 ENCOUNTER — Other Ambulatory Visit: Payer: Self-pay | Admitting: *Deleted

## 2014-07-22 MED ORDER — BISOPROLOL-HYDROCHLOROTHIAZIDE 5-6.25 MG PO TABS: 1.0000 | ORAL_TABLET | Freq: Every day | ORAL | Status: DC

## 2014-07-23 DIAGNOSIS — M461 Sacroiliitis, not elsewhere classified: Secondary | ICD-10-CM | POA: Diagnosis not present

## 2014-07-23 DIAGNOSIS — M47816 Spondylosis without myelopathy or radiculopathy, lumbar region: Secondary | ICD-10-CM | POA: Diagnosis not present

## 2014-08-06 ENCOUNTER — Other Ambulatory Visit: Payer: Self-pay | Admitting: *Deleted

## 2014-08-06 MED ORDER — MELOXICAM 15 MG PO TABS: ORAL_TABLET | ORAL | Status: DC

## 2014-08-26 DIAGNOSIS — H3532 Exudative age-related macular degeneration: Secondary | ICD-10-CM | POA: Diagnosis not present

## 2014-08-27 ENCOUNTER — Other Ambulatory Visit: Payer: Self-pay | Admitting: *Deleted

## 2014-08-27 MED ORDER — INSULIN ISOPHANE & REGULAR (HUMAN 70-30)100 UNIT/ML KWIKPEN: PEN_INJECTOR | SUBCUTANEOUS | Status: DC

## 2014-09-04 ENCOUNTER — Other Ambulatory Visit: Payer: Self-pay

## 2014-09-04 ENCOUNTER — Telehealth: Payer: Self-pay

## 2014-09-04 MED ORDER — INSULIN GLARGINE 100 UNIT/ML SOLOSTAR PEN: 30.0000 [IU] | PEN_INJECTOR | Freq: Every day | SUBCUTANEOUS | Status: DC

## 2014-09-04 MED ORDER — AZITHROMYCIN 250 MG PO TABS: ORAL_TABLET | ORAL | Status: AC

## 2014-09-04 NOTE — Telephone Encounter (Signed)
Pt called c/o productive cough, chest congestion, and sinus drainage. Per Dr.McKeown, pt needs to start [place]. Pt aware and is advised to sched OV if no relief after finishing ABX. Rx was sent to [facility]

## 2014-09-05 ENCOUNTER — Other Ambulatory Visit: Payer: Self-pay | Admitting: Physician Assistant

## 2014-09-18 ENCOUNTER — Ambulatory Visit (INDEPENDENT_AMBULATORY_CARE_PROVIDER_SITE_OTHER): Payer: Medicare Other | Admitting: Internal Medicine

## 2014-09-18 ENCOUNTER — Encounter: Payer: Self-pay | Admitting: Internal Medicine

## 2014-09-18 VITALS — BP 108/44 | HR 60 | Temp 97.2°F | Resp 16 | Ht 64.0 in | Wt 136.0 lb

## 2014-09-18 DIAGNOSIS — I10 Essential (primary) hypertension: Secondary | ICD-10-CM

## 2014-09-18 DIAGNOSIS — Z0001 Encounter for general adult medical examination with abnormal findings: Secondary | ICD-10-CM

## 2014-09-18 DIAGNOSIS — E782 Mixed hyperlipidemia: Secondary | ICD-10-CM | POA: Diagnosis not present

## 2014-09-18 DIAGNOSIS — Z1331 Encounter for screening for depression: Secondary | ICD-10-CM

## 2014-09-18 DIAGNOSIS — Z79899 Other long term (current) drug therapy: Secondary | ICD-10-CM | POA: Diagnosis not present

## 2014-09-18 DIAGNOSIS — E559 Vitamin D deficiency, unspecified: Secondary | ICD-10-CM

## 2014-09-18 DIAGNOSIS — E1049 Type 1 diabetes mellitus with other diabetic neurological complication: Secondary | ICD-10-CM

## 2014-09-18 DIAGNOSIS — E1029 Type 1 diabetes mellitus with other diabetic kidney complication: Secondary | ICD-10-CM | POA: Diagnosis not present

## 2014-09-18 DIAGNOSIS — R6889 Other general symptoms and signs: Secondary | ICD-10-CM

## 2014-09-18 DIAGNOSIS — Z0000 Encounter for general adult medical examination without abnormal findings: Secondary | ICD-10-CM

## 2014-09-18 DIAGNOSIS — E1040 Type 1 diabetes mellitus with diabetic neuropathy, unspecified: Secondary | ICD-10-CM

## 2014-09-18 DIAGNOSIS — Z9181 History of falling: Secondary | ICD-10-CM

## 2014-09-18 LAB — HEPATIC FUNCTION PANEL
ALT: 12 U/L (ref 0–35)
AST: 23 U/L (ref 0–37)
Albumin: 4.4 g/dL (ref 3.5–5.2)
Alkaline Phosphatase: 53 U/L (ref 39–117)
Bilirubin, Direct: 0.1 mg/dL (ref 0.0–0.3)
Indirect Bilirubin: 0.4 mg/dL (ref 0.2–1.2)
Total Bilirubin: 0.5 mg/dL (ref 0.2–1.2)
Total Protein: 7.4 g/dL (ref 6.0–8.3)

## 2014-09-18 LAB — LIPID PANEL
Cholesterol: 137 mg/dL (ref 0–200)
HDL: 54 mg/dL (ref >=46)
LDL Cholesterol: 60 mg/dL (ref 0–99)
Total CHOL/HDL Ratio: 2.5 Ratio
Triglycerides: 114 mg/dL (ref <150)
VLDL: 23 mg/dL (ref 0–40)

## 2014-09-18 LAB — CBC WITH DIFFERENTIAL/PLATELET
Basophils Absolute: 0.1 10*3/uL (ref 0.0–0.1)
Basophils Relative: 1 % (ref 0–1)
Eosinophils Absolute: 0.8 10*3/uL — ABNORMAL HIGH (ref 0.0–0.7)
Eosinophils Relative: 8 % — ABNORMAL HIGH (ref 0–5)
HCT: 35.5 % — ABNORMAL LOW (ref 36.0–46.0)
Hemoglobin: 11.8 g/dL — ABNORMAL LOW (ref 12.0–15.0)
Lymphocytes Relative: 37 % (ref 12–46)
Lymphs Abs: 3.5 10*3/uL (ref 0.7–4.0)
MCH: 30.4 pg (ref 26.0–34.0)
MCHC: 33.2 g/dL (ref 30.0–36.0)
MCV: 91.5 fL (ref 78.0–100.0)
MPV: 11.7 fL (ref 8.6–12.4)
Monocytes Absolute: 1.2 10*3/uL — ABNORMAL HIGH (ref 0.1–1.0)
Monocytes Relative: 13 % — ABNORMAL HIGH (ref 3–12)
Neutro Abs: 3.9 10*3/uL (ref 1.7–7.7)
Neutrophils Relative %: 41 % — ABNORMAL LOW (ref 43–77)
Platelets: 330 10*3/uL (ref 150–400)
RBC: 3.88 MIL/uL (ref 3.87–5.11)
RDW: 13.4 % (ref 11.5–15.5)
WBC: 9.5 10*3/uL (ref 4.0–10.5)

## 2014-09-18 LAB — BASIC METABOLIC PANEL WITH GFR
BUN: 32 mg/dL — ABNORMAL HIGH (ref 6–23)
CO2: 30 mEq/L (ref 19–32)
Calcium: 10.5 mg/dL (ref 8.4–10.5)
Chloride: 104 mEq/L (ref 96–112)
Creat: 1.76 mg/dL — ABNORMAL HIGH (ref 0.50–1.10)
GFR, Est African American: 31 mL/min — ABNORMAL LOW
GFR, Est Non African American: 27 mL/min — ABNORMAL LOW
Glucose, Bld: 83 mg/dL (ref 70–99)
Potassium: 4.9 mEq/L (ref 3.5–5.3)
Sodium: 143 mEq/L (ref 135–145)

## 2014-09-18 LAB — MAGNESIUM: Magnesium: 1.9 mg/dL (ref 1.5–2.5)

## 2014-09-18 LAB — TSH: TSH: 2.043 u[IU]/mL (ref 0.350–4.500)

## 2014-09-18 LAB — HEMOGLOBIN A1C
Hgb A1c MFr Bld: 7.2 % — ABNORMAL HIGH (ref <5.7)
Mean Plasma Glucose: 160 mg/dL — ABNORMAL HIGH (ref <117)

## 2014-09-18 NOTE — Patient Instructions (Signed)
Recommend Adult Low dose Aspirin or baby Aspirin 81 mg daily   To reduce risk of Colon Cancer 20 %,   Skin Cancer 26 % ,   Melanoma 46%   and   Pancreatic cancer 60%  ++++++++++++++++++   Recommend the book "The END of DIETING" by Dr Excell Seltzer   & the book "The END of DIABETES " by Dr Excell Seltzer  At [email or web address] - get book & Audio CD's     Being diabetic has a  300% increased risk for heart attack, stroke, cancer, and alzheimer- type vascular dementia. It is very important that you work harder with diet by avoiding all foods that are white. Avoid white rice (brown & wild rice is OK), white potatoes (sweetpotatoes in moderation is OK), White bread or wheat bread or anything made out of white flour like bagels, donuts, rolls, buns, biscuits, cakes, pastries, cookies, pizza crust, and pasta (made from white flour & egg whites) - vegetarian pasta or spinach or wheat pasta is OK. Multigrain breads like Arnold's or [employer], or multigrain sandwich thins or flatbreads.  Diet, exercise and weight loss can reverse and cure diabetes in the early stages.  Diet, exercise and weight loss is very important in the control and prevention of complications of diabetes which affects every system in your body, ie. Brain - dementia/stroke, eyes - glaucoma/blindness, heart - heart attack/heart failure, kidneys - dialysis, stomach - gastric paralysis, intestines - malabsorption, nerves - severe painful neuritis, circulation - gangrene & loss of a leg(s), and finally cancer and Alzheimers.    I recommend avoid fried & greasy foods,  sweets/candy, white rice (brown or wild rice or Quinoa is OK), white potatoes (sweet potatoes are OK) - anything made from white flour - bagels, doughnuts, rolls, buns, biscuits,white and wheat breads, pizza crust and traditional pasta made of white flour & egg white(vegetarian pasta or spinach or wheat pasta is OK).  Multi-grain bread is OK - like multi-grain flat bread or  sandwich thins. Avoid alcohol in excess. Exercise is also important.    Eat all the vegetables you want - avoid meat, especially red meat and dairy - especially cheese.  Cheese is the most concentrated form of trans-fats which is the worst thing to clog up our arteries. Veggie cheese is OK which can be found in the fresh produce section at [facility] or [employer] or [employer]  ++++++++++++++++++++++++++  Preventive Care for Adults  A healthy lifestyle and preventive care can promote health and wellness. Preventive health guidelines for women include the following key practices.  A routine yearly physical is a good way to check with your health care provider about your health and preventive screening. It is a chance to share any concerns and updates on your health and to receive a thorough exam.  Visit your dentist for a routine exam and preventive care every 6 months. Brush your teeth twice a day and floss once a day. Good oral hygiene prevents tooth decay and gum disease.  The frequency of eye exams is based on your age, health, family medical history, use of contact lenses, and other factors. Follow your health care provider's recommendations for frequency of eye exams.  Eat a healthy diet. Foods like vegetables, fruits, whole grains, low-fat dairy products, and lean protein foods contain the nutrients you need without too many calories. Decrease your intake of foods high in solid fats, added sugars, and salt. Eat the right amount of calories for you.Get information about a  proper diet from your health care provider, if necessary.  Regular physical exercise is one of the most important things you can do for your health. Most adults should get at least 150 minutes of moderate-intensity exercise (any activity that increases your heart rate and causes you to sweat) each week. In addition, most adults need muscle-strengthening exercises on 2 or more days a week.  Maintain a healthy weight.  The body mass index (BMI) is a screening tool to identify possible weight problems. It provides an estimate of body fat based on height and weight. Your health care provider can find your BMI and can help you achieve or maintain a healthy weight.For adults 20 years and older:  A BMI below 18.5 is considered underweight.  A BMI of 18.5 to 24.9 is normal.  A BMI of 25 to 29.9 is considered overweight.  A BMI of 30 and above is considered obese.  Maintain normal blood lipids and cholesterol levels by exercising and minimizing your intake of saturated fat. Eat a balanced diet with plenty of fruit and vegetables. If your lipid or cholesterol levels are high, you are over 50, or you are at high risk for heart disease, you may need your cholesterol levels checked more frequently.Ongoing high lipid and cholesterol levels should be treated with medicines if diet and exercise are not working.  If you smoke, find out from your health care provider how to quit. If you do not use tobacco, do not start.  Lung cancer screening is recommended for adults aged 71-80 years who are at high risk for developing lung cancer because of a history of smoking. A yearly low-dose CT scan of the lungs is recommended for people who have at least a 30-pack-year history of smoking and are a current smoker or have quit within the past 15 years. A pack year of smoking is smoking an average of 1 pack of cigarettes a day for 1 year (for example: 1 pack a day for 30 years or 2 packs a day for 15 years). Yearly screening should continue until the smoker has stopped smoking for at least 15 years. Yearly screening should be stopped for people who develop a health problem that would prevent them from having lung cancer treatment.  Avoid use of street drugs. Do not share needles with anyone. Ask for help if you need support or instructions about stopping the use of drugs.  High blood pressure causes heart disease and increases the risk of  stroke.  Ongoing high blood pressure should be treated with medicines if weight loss and exercise do not work.  If you are 48-22 years old, ask your health care provider if you should take aspirin to prevent strokes.  Diabetes screening involves taking a blood sample to check your fasting blood sugar level. This should be done once every 3 years, after age 90, if you are within normal weight and without risk factors for diabetes. Testing should be considered at a younger age or be carried out more frequently if you are overweight and have at least 1 risk factor for diabetes.  Breast cancer screening is essential preventive care for women. You should practice "breast self-awareness." This means understanding the normal appearance and feel of your breasts and may include breast self-examination. Any changes detected, no matter how small, should be reported to a health care provider. Women in their 22s and 30s should have a clinical breast exam (CBE) by a health care provider as part of a regular health  exam every 1 to 3 years. After age 62, women should have a CBE every year. Starting at age 60, women should consider having a mammogram (breast X-ray test) every year. Women who have a family history of breast cancer should talk to their health care provider about genetic screening. Women at a high risk of breast cancer should talk to their health care providers about having an MRI and a mammogram every year.  Breast cancer gene (BRCA)-related cancer risk assessment is recommended for women who have family members with BRCA-related cancers. BRCA-related cancers include breast, ovarian, tubal, and peritoneal cancers. Having family members with these cancers may be associated with an increased risk for harmful changes (mutations) in the breast cancer genes BRCA1 and BRCA2. Results of the assessment will determine the need for genetic counseling and BRCA1 and BRCA2 testing.  Routine pelvic exams to screen for  cancer are no longer recommended for nonpregnant women who are considered low risk for cancer of the pelvic organs (ovaries, uterus, and vagina) and who do not have symptoms. Ask your health care provider if a screening pelvic exam is right for you.  If you have had past treatment for cervical cancer or a condition that could lead to cancer, you need Pap tests and screening for cancer for at least 20 years after your treatment. If Pap tests have been discontinued, your risk factors (such as having a new sexual partner) need to be reassessed to determine if screening should be resumed. Some women have medical problems that increase the chance of getting cervical cancer. In these cases, your health care provider may recommend more frequent screening and Pap tests.    Colorectal cancer can be detected and often prevented. Most routine colorectal cancer screening begins at the age of 27 years and continues through age 58 years. However, your health care provider may recommend screening at an earlier age if you have risk factors for colon cancer. On a yearly basis, your health care provider may provide home test kits to check for hidden blood in the stool. Use of a small camera at the end of a tube, to directly examine the colon (sigmoidoscopy or colonoscopy), can detect the earliest forms of colorectal cancer. Talk to your health care provider about this at age 73, when routine screening begins. Direct exam of the colon should be repeated every 5-10 years through age 40 years, unless early forms of pre-cancerous polyps or small growths are found.  Osteoporosis is a disease in which the bones lose minerals and strength with aging. This can result in serious bone fractures or breaks. The risk of osteoporosis can be identified using a bone density scan. Women ages 33 years and over and women at risk for fractures or osteoporosis should discuss screening with their health care providers. Ask your health care  provider whether you should take a calcium supplement or vitamin D to reduce the rate of osteoporosis.  Menopause can be associated with physical symptoms and risks. Hormone replacement therapy is available to decrease symptoms and risks. You should talk to your health care provider about whether hormone replacement therapy is right for you.  Use sunscreen. Apply sunscreen liberally and repeatedly throughout the day. You should seek shade when your shadow is shorter than you. Protect yourself by wearing long sleeves, pants, a wide-brimmed hat, and sunglasses year round, whenever you are outdoors.  Once a month, do a whole body skin exam, using a mirror to look at the skin on your back. Tell  your health care provider of new moles, moles that have irregular borders, moles that are larger than a pencil eraser, or moles that have changed in shape or color.  Stay current with required vaccines (immunizations).  Influenza vaccine. All adults should be immunized every year.  Tetanus, diphtheria, and acellular pertussis (Td, Tdap) vaccine. Pregnant women should receive 1 dose of Tdap vaccine during each pregnancy. The dose should be obtained regardless of the length of time since the last dose. Immunization is preferred during the 27th-36th week of gestation. An adult who has not previously received Tdap or who does not know her vaccine status should receive 1 dose of Tdap. This initial dose should be followed by tetanus and diphtheria toxoids (Td) booster doses every 10 years. Adults with an unknown or incomplete history of completing a 3-dose immunization series with Td-containing vaccines should begin or complete a primary immunization series including a Tdap dose. Adults should receive a Td booster every 10 years.    Zoster vaccine. One dose is recommended for adults aged 51 years or older unless certain conditions are present.    Pneumococcal 13-valent conjugate (PCV13) vaccine. When indicated, a  person who is uncertain of her immunization history and has no record of immunization should receive the PCV13 vaccine. An adult aged 95 years or older who has certain medical conditions and has not been previously immunized should receive 1 dose of PCV13 vaccine. This PCV13 should be followed with a dose of pneumococcal polysaccharide (PPSV23) vaccine. The PPSV23 vaccine dose should be obtained at least 8 weeks after the dose of PCV13 vaccine. An adult aged 76 years or older who has certain medical conditions and previously received 1 or more doses of PPSV23 vaccine should receive 1 dose of PCV13. The PCV13 vaccine dose should be obtained 1 or more years after the last PPSV23 vaccine dose.    Pneumococcal polysaccharide (PPSV23) vaccine. When PCV13 is also indicated, PCV13 should be obtained first. All adults aged 67 years and older should be immunized. An adult younger than age 89 years who has certain medical conditions should be immunized. Any person who resides in a nursing home or long-term care facility should be immunized. An adult smoker should be immunized. People with an immunocompromised condition and certain other conditions should receive both PCV13 and PPSV23 vaccines. People with human immunodeficiency virus (HIV) infection should be immunized as soon as possible after diagnosis. Immunization during chemotherapy or radiation therapy should be avoided. Routine use of PPSV23 vaccine is not recommended for American Indians, [place] Natives, or people younger than 65 years unless there are medical conditions that require PPSV23 vaccine. When indicated, people who have unknown immunization and have no record of immunization should receive PPSV23 vaccine. One-time revaccination 5 years after the first dose of PPSV23 is recommended for people aged 19-64 years who have chronic kidney failure, nephrotic syndrome, asplenia, or immunocompromised conditions. People who received 1-2 doses of PPSV23 before age  40 years should receive another dose of PPSV23 vaccine at age 43 years or later if at least 5 years have passed since the previous dose. Doses of PPSV23 are not needed for people immunized with PPSV23 at or after age 68 years.   Preventive Services / Frequency  Ages 60 years and over  Blood pressure check.  Lipid and cholesterol check.  Lung cancer screening. / Every year if you are aged 56-80 years and have a 30-pack-year history of smoking and currently smoke or have quit within the past 15  years. Yearly screening is stopped once you have quit smoking for at least 15 years or develop a health problem that would prevent you from having lung cancer treatment.  Clinical breast exam.** / Every year after age 82 years.  BRCA-related cancer risk assessment.** / For women who have family members with a BRCA-related cancer (breast, ovarian, tubal, or peritoneal cancers).  Mammogram.** / Every year beginning at age 60 years and continuing for as long as you are in good health. Consult with your health care provider.  Pap test.** / Every 3 years starting at age 79 years through age 82 or 50 years with 3 consecutive normal Pap tests. Testing can be stopped between 65 and 70 years with 3 consecutive normal Pap tests and no abnormal Pap or HPV tests in the past 10 years.  Fecal occult blood test (FOBT) of stool. / Every year beginning at age 34 years and continuing until age 66 years. You may not need to do this test if you get a colonoscopy every 10 years.  Flexible sigmoidoscopy or colonoscopy.** / Every 5 years for a flexible sigmoidoscopy or every 10 years for a colonoscopy beginning at age 1 years and continuing until age 58 years.  Hepatitis C blood test.** / For all people born from [birth year] through 1963 and any individual with known risks for hepatitis C.  Osteoporosis screening.** / A one-time screening for women ages 52 years and over and women at risk for fractures or osteoporosis.  Skin  self-exam. / Monthly.  Influenza vaccine. / Every year.  Tetanus, diphtheria, and acellular pertussis (Tdap/Td) vaccine.** / 1 dose of Td every 10 years.  Zoster vaccine.** / 1 dose for adults aged 36 years or older.  Pneumococcal 13-valent conjugate (PCV13) vaccine.** / Consult your health care provider.  Pneumococcal polysaccharide (PPSV23) vaccine.** / 1 dose for all adults aged 51 years and older. Screening for abdominal aortic aneurysm (AAA)  by ultrasound is recommended for people who have history of high blood pressure or who are current or former smokers.

## 2014-09-18 NOTE — Progress Notes (Signed)
Patient ID: [name], female   DOB: 08/19/1929, 81 y.o.   MRN: [id number]  MEDICARE ANNUAL WELLNESS VISIT AND OV  Assessment:   1. Essential hypertension  - TSH  2. Mixed hyperlipidemia  - Lipid panel  3. 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 Vitamin D deficiency  - Vit D  25 hydroxy  5. 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 Depression screen  - Screen Negative  7. At low risk for fall   8. Medication management  - CBC with Differential/Platelet - BASIC METABOLIC PANEL WITH GFR - Hepatic function panel - Magnesium  9. Routine general medical examination at a health care facility   Plan:   During the course of the visit the patient was educated and counseled about appropriate screening and preventive services including:    Pneumococcal vaccine   Influenza vaccine  Td vaccine  Screening electrocardiogram  Bone densitometry screening  Colorectal cancer screening  Diabetes screening  Glaucoma screening  Nutrition counseling   Advanced directives: requested  Screening recommendations, referrals: Vaccinations:  Immunization History  Administered Date(s) Administered  . DT 01/04/2011  . Influenza Split 01/23/2013  . Influenza, High Dose Seasonal PF 02/27/2014  . Pneumococcal Conjugate-13 02/27/2014  . Pneumococcal Polysaccharide-23 01/06/2012  Shingles vaccine undecided due to high co-pay. Hep B vaccine not indicated  Nutrition assessed and recommended  Colonoscopy 2006 Recommended yearly ophthalmology/optometry visit for glaucoma screening and checkup Recommended yearly dental visit for hygiene and checkup Advanced directives - yes  Conditions/risks identified: BMI: Discussed weight loss, diet, and increase physical activity.  Increase physical activity: AHA recommends 150 minutes of physical activity a week.  Medications reviewed Diabetes is not at goal,  ACE/ARB therapy: Yes. Urinary Incontinence is not an issue: discussed non pharmacology and pharmacology options.  Fall risk: low- discussed PT, home fall assessment, medications.   Subjective:    [name]  presents for Medicare Annual Wellness Visit and OV.  Date of last medicare wellness visit was 11/22/2013. This very nice 81 y.o. MWF also presents for 6 month follow up with Hypertension, Hyperlipidemia, T1_IDDM and Vitamin D Deficiency.    Patient is treated for HTN since 2000 & BP has been controlled at home. Today's BP is 108/84. Patient had a negative Cardiolite in 2009. Patient has had no complaints of any cardiac type chest pain, palpitations, dyspnea/orthopnea/PND, dizziness, claudication, or dependent edema.   Hyperlipidemia is controlled with diet & meds. Patient denies myalgias or other med SE's. Last Lipids were at goal - Total Chol 149; HDL  56; LDL  69; Triglycerides 120 on 02/27/2014.   Also, the patient has history of T1_IDDM since 1996 and has been on insulin since 2000 and she has complications w/ CKD, retinopathy and Neuropathy. She has had no symptoms of reactive hypoglycemia, diabetic polys or visual blurring. She does report tingling and occasional painful paresthesias of the feet.  Last A1c was 8.1% on 02/27/2014.    Further, the patient also has history of Vitamin D Deficiency and supplements vitamin D without any suspected side-effects. Last vitamin D was  43 on 02/27/2014.   Names of Other Physician/Practitioners you currently use: 1. [facility] here for primary care 2. Dr's Groat & Rankin, eye doctor, last visit in April & May respectively 3. Dr Scott Wolrond, DDS, dentist, last visit May 2016  Patient Care Team: William McKeown, MD as PCP -   General (Internal Medicine) Christopher Groat, MD as Consulting Physician (Optometry) Gary A Rankin, MD as Consulting Physician (Ophthalmology) Mark Roy, MD as Attending Physician  (Neurosurgery) Peter Dalldorf, MD as Consulting Physician (Orthopedic Surgery) Daniel P Jacobs, MD as Attending Physician (Gastroenterology)  Medication Review: Medication Sig  . aspirin 81 MG tablet Take 81 mg by mouth daily.  . bisoprolol-hydrochlorothiazide (ZIAC) 5-6.25 MG per tablet Take 1 tablet by mouth daily. For BP  . Blood Glucose Monitoring Suppl (ACCU-CHEK AVIVA PLUS) W/DEVICE KIT   . Cholecalciferol (VITAMIN D3) 2000 UNITS TABS Take 1 capsule by mouth 2 (two) times daily.  . fenofibrate micronized (LOFIBRA) 134 MG capsule TAKE ONE CAPSULE BY MOUTH DAILY FOR BLOOD FATS  . gabapentin (NEURONTIN) 300 MG capsule TAKE ONE CAPSULE BY MOUTH THREE(3) TO FOUR(4)  TIMES DAILY FOR DIABETIC NEUROPATHY PAIN  . Insulin Glargine (LANTUS) 100 UNIT/ML Solostar Pen Inject 30 Units into the skin at bedtime. or as directed to control blood sugars  . Insulin Isophane & Regular Human (HUMULIN 70/30 PEN) (70-30) 100 UNIT/ML PEN Inject 5-10 units Derby tid  with meals,  on a sliding scale.  DX-10.29  . losartan (COZAAR) 100 MG tablet TAKE 1 TABLET BY MOUTH DAILY FOR BLOOD PRESSURE HEART AND KIDNEYS  . Magnesium 500 MG CAPS Take 500 mg by mouth 2 (two) times daily.  . meloxicam (MOBIC) 15 MG tablet 1/2 to 1 tablet daily with food for pain and inflammation  . metFORMIN (GLUCOPHAGE) 1000 MG tablet TAKE ONE TABLET TWO TIMES A DAY AFTER MEALS FOR DIABETES  . Multiple Vitamins-Minerals (HAIR/SKIN/NAILS) TABS Take by mouth 2 (two) times daily.  . pravastatin (PRAVACHOL) 40 MG tablet TAKE 1 TABLET BY MOUTH AT BEDTIME FOR CHOLESTEROL  . rOPINIRole (REQUIP) 3 MG tablet TAKE 1 TABLET BY MOUTH TWICE DAILY AND TAKE TWO TABLETS AT BEDTIME FOR RESTLESS  LEGS  . traMADol (ULTRAM) 50 MG tablet Take 1/2 to 1 tablet 4 x day or every 4 hours if needed for severe pain   Current Problems (verified) Patient Active Problem List   Diagnosis Date Noted  . Medication management 08/06/2013  . Type 1 diabetes mellitus with  neurological manifestations 06/04/2013  . DM (diabetes mellitus) type I controlled with renal manifestation   . Mixed hyperlipidemia   . Arthritis   . Vitamin D deficiency   . Hypertension 07/29/2012  . Chronic pain 07/29/2012   Screening Tests Health Maintenance  Topic Date Due  . OPHTHALMOLOGY EXAM  01/21/1942  . TETANUS/TDAP  01/22/1951  . ZOSTAVAX  01/22/1992  . HEMOGLOBIN A1C  08/28/2014  . INFLUENZA VACCINE  11/24/2014  . COLONOSCOPY  02/18/2015  . FOOT EXAM  02/28/2015  . URINE MICROALBUMIN  02/28/2015  . DEXA SCAN  Completed  . PNA vac Low Risk Adult  Completed    Immunization History  Administered Date(s) Administered  . DT 01/04/2011  . Influenza Split 01/23/2013  . Influenza, High Dose Seasonal PF 02/27/2014  . Pneumococcal Conjugate-13 02/27/2014  . Pneumococcal Polysaccharide-23 01/06/2012   Preventative care: Last colonoscopy: 2006  History reviewed: allergies, current medications, past family history, past medical history, past social history, past surgical history and problem list  Risk Factors: Tobacco History  Substance Use Topics  . Smoking status: Never Smoker   . Smokeless tobacco: Never Used  . Alcohol Use: No   She does not smoke.  Patient is not a former smoker. Are there smokers in your home (other than you)?  No Alcohol Current alcohol use: none    Caffeine Current caffeine use: coffee 1-3 cups /day, tea 0-2 /day and caffeinated soft drinks 0-2 /day  Exercise Current exercise: housecleaning, walking and yard work  Nutrition/Diet Current diet: in general, a "healthy" diet    Cardiac risk factors: advanced age (older than 55 for men, 65 for women), diabetes mellitus, dyslipidemia, hypertension, obesity (BMI >= 30 kg/m2) and sedentary lifestyle.  Depression Screen (Note: if answer to either of the following is "Yes", a more complete depression screening is indicated)   Q1: Over the past two weeks, have you felt down, depressed or  hopeless? No  Q2: Over the past two weeks, have you felt little interest or pleasure in doing things? No  Have you lost interest or pleasure in daily life? No  Do you often feel hopeless? No  Do you cry easily over simple problems? No  Activities of Daily Living In your present state of health, do you have any difficulty performing the following activities?:  Driving? No Managing money?  No Feeding yourself? No Getting from bed to chair? No Climbing a flight of stairs? No Preparing food and eating?: No Bathing or showering? No Getting dressed: No Getting to the toilet? No Using the toilet:No Moving around from place to place: No In the past year have you fallen or had a near fall?:No   Are you sexually active?  No  Do you have more than one partner?  No  Vision Difficulties: No  Hearing Difficulties: No Do you often ask people to speak up or repeat themselves? No Do you experience ringing or noises in your ears? No Do you have difficulty understanding soft or whispered voices? Sometimes.  Cognition  Do you feel that you have a problem with memory?No  Do you often misplace items? No  Do you feel safe at home?  Yes  Advanced directives Does patient have a Health Care Power of Attorney? Yes Does patient have a Living Will? Yes  Past Medical History  Diagnosis Date  . Liver lesion   . Chronic back pain   . Neuropathy   . Diabetic neuropathy   . Wears glasses   . Type II or unspecified type diabetes mellitus without mention of complication, not stated as uncontrolled   . Hypercholesteremia   . Hypertension   . Arthritis   . Vitamin D deficiency    Past Surgical History  Procedure Laterality Date  . Abdominal hysterectomy    . Cesarean section      x 3   . Back surgery  1993    lumb lam-fusion  . Appendectomy    . Colonoscopy    . Eye surgery      both cataracts  . Trigger finger release  01/10/2012    Procedure: RELEASE TRIGGER FINGER/A-1 PULLEY;  Surgeon:  Peter G Dalldorf, MD;  Location: [facility];  Service: Orthopedics;  Laterality: Right;  right ring trigger release   ROS: Constitutional: Denies fever, chills, weight loss/gain, headaches, insomnia, fatigue, night sweats, and change in appetite. Eyes: Denies redness, blurred vision, diplopia, discharge, itchy, watery eyes.  ENT: Denies discharge, congestion, post nasal drip, epistaxis, sore throat, earache, hearing loss, dental pain, Tinnitus, Vertigo, Sinus pain, snoring.  Cardio: Denies chest pain, palpitations, irregular heartbeat, syncope, dyspnea, diaphoresis, orthopnea, PND, claudication, edema Respiratory: denies cough, dyspnea, DOE, pleurisy, hoarseness, laryngitis, wheezing.  Gastrointestinal: Denies dysphagia, heartburn, reflux, water brash, pain, cramps, nausea, vomiting, bloating, diarrhea, constipation, hematemesis, melena, hematochezia, jaundice, hemorrhoids Genitourinary: Denies dysuria, frequency, urgency, nocturia, hesitancy, discharge,   hematuria, flank pain Breast: Breast lumps, nipple discharge, bleeding.  Musculoskeletal: Denies arthralgia, myalgia, stiffness, Jt. Swelling, pain, limp, and strain/sprain. Denies falls. Skin: Denies puritis, rash, hives, warts, acne, eczema, changing in skin lesion Neuro: No weakness, tremor, incoordination, spasms, paresthesia, pain Psychiatric: Denies confusion, memory loss, sensory loss. Denies Depression. Endocrine: Denies change in weight, skin, hair change, nocturia, and paresthesia, diabetic polys, visual blurring, hyper / hypo glycemic episodes.  Heme/Lymph: No excessive bleeding, bruising, enlarged lymph nodes  Objective:     BP 108/44 mmHg  Pulse 60  Temp(Src) 97.2 F (36.2 C)  Resp 16  Ht 5' 4" (1.626 m)  Wt 136 lb (61.689 kg)  BMI 23.33 kg/m2  General Appearance: Well nourished, alert, WD/WN, female and in no apparent distress. Eyes: PERRLA, EOMs, conjunctiva no swelling or erythema, normal fundi and  vessels. Sinuses: No frontal/maxillary tenderness ENT/Mouth: EACs patent / TMs  nl. Nares clear without erythema, swelling, mucoid exudates. Oral hygiene is good. No erythema, swelling, or exudate. Tongue normal, non-obstructing. Tonsils not swollen or erythematous. Hearing normal.  Neck: Supple, thyroid normal. No bruits, nodes or JVD. Respiratory: Respiratory effort normal.  BS equal and clear bilateral without rales, rhonci, wheezing or stridor. Cardio: Heart sounds are normal with regular rate and rhythm and no murmurs, rubs or gallops. Peripheral pulses are normal and equal bilaterally without edema. No aortic or femoral bruits. Chest: symmetric with normal excursions and percussion. Abdomen: Flat, soft  with nl bowel sounds. Nontender, no guarding, rebound, hernias, masses, or organomegaly.  Lymphatics: Non tender without lymphadenopathy.  Musculoskeletal: Full ROM all peripheral extremities, joint stability, 5/5 strength, and normal gait. Skin: Warm and dry without rashes, lesions, cyanosis, clubbing or  ecchymosis.  Neuro: Cranial nerves intact. DTR's UE  Nl/Equal and DTR's LE's absent.  Normal muscle tone, no cerebellar symptoms. Sensation decreased in a stocking distribution by monofilament testing to the mid foot bilateral.  Pysch: Alert and oriented X 3, normal affect, Insight and Judgment appropriate.   Cognitive Testing  Alert? Yes  Normal Appearance?Yes  Oriented to person? Yes  Place? Yes   Time? Yes  Recall of three objects?  Yes  Can perform simple calculations? Yes  Displays appropriate judgment? Yes  Can read the correct time from a watch/clock?Yes  Medicare Attestation I have personally reviewed: The patient's medical and social history Their use of alcohol, tobacco or illicit drugs Their current medications and supplements The patient's functional ability including ADLs,fall risks, home safety risks, cognitive, and hearing and visual impairment Diet and physical  activities Evidence for depression or mood disorders  The patient's weight, height, BMI, and visual acuity have been recorded in the chart.  I have made referrals, counseling, and provided education to the patient based on review of the above and I have provided the patient with a written personalized care plan for preventive services.  Over 40 minutes of exam, counseling, chart review was performed.  Raghav Verrilli DAVID, MD   09/18/2014

## 2014-09-19 LAB — VITAMIN D 25 HYDROXY (VIT D DEFICIENCY, FRACTURES): Vit D, 25-Hydroxy: 36 ng/mL (ref 30–100)

## 2014-09-29 ENCOUNTER — Other Ambulatory Visit: Payer: Self-pay

## 2014-09-29 MED ORDER — METFORMIN HCL 1000 MG PO TABS: ORAL_TABLET | ORAL | Status: DC

## 2014-10-01 ENCOUNTER — Other Ambulatory Visit: Payer: Self-pay | Admitting: Internal Medicine

## 2014-10-01 DIAGNOSIS — T63483S Toxic effect of venom of other arthropod, assault, sequela: Secondary | ICD-10-CM

## 2014-10-01 MED ORDER — PREDNISONE 20 MG PO TABS: ORAL_TABLET | ORAL | Status: DC

## 2014-10-10 ENCOUNTER — Telehealth: Payer: Self-pay | Admitting: *Deleted

## 2014-10-10 DIAGNOSIS — T63483S Toxic effect of venom of other arthropod, assault, sequela: Secondary | ICD-10-CM

## 2014-10-10 MED ORDER — PREDNISONE 10 MG PO TABS: ORAL_TABLET | ORAL | Status: DC

## 2014-10-10 MED ORDER — AZITHROMYCIN 250 MG PO TABS: ORAL_TABLET | ORAL | Status: AC

## 2014-10-10 NOTE — Telephone Encounter (Signed)
Patient called and states she has had laryngitis x 3-4 days.  OK to send in RX for Z-pak and Prednisone.

## 2014-10-16 ENCOUNTER — Ambulatory Visit (INDEPENDENT_AMBULATORY_CARE_PROVIDER_SITE_OTHER): Payer: Medicare Other | Admitting: Physician Assistant

## 2014-10-16 VITALS — BP 120/60 | HR 52 | Temp 97.7°F | Resp 16 | Wt 133.0 lb

## 2014-10-16 DIAGNOSIS — B351 Tinea unguium: Secondary | ICD-10-CM | POA: Diagnosis not present

## 2014-10-16 DIAGNOSIS — G5602 Carpal tunnel syndrome, left upper limb: Secondary | ICD-10-CM

## 2014-10-16 DIAGNOSIS — E1029 Type 1 diabetes mellitus with other diabetic kidney complication: Secondary | ICD-10-CM

## 2014-10-16 DIAGNOSIS — L600 Ingrowing nail: Secondary | ICD-10-CM

## 2014-10-16 MED ORDER — TERBINAFINE HCL 250 MG PO TABS: 250.0000 mg | ORAL_TABLET | Freq: Every day | ORAL | Status: AC

## 2014-10-16 NOTE — Patient Instructions (Signed)
Get carpal tunnel splint and wear at night for 6 weeks Can refer to ortho if need to  Okay I will send in lamisil for you to take. You can only get it from [facility], [facility] or [facility] or your insurance will not cover it.The lamisil is taken once a day for 3 months and then if can be taken for several months after for a month on it and month off it. It gets processed through you liver so we need to check your liver function at 6 weeks after taking the drug. It can take up to 6 months to a year for your toenails to get better.   Onychomycosis/Fungal Toenails  WHAT IS IT? An infection that lies within the keratin of your nail plate that is caused by a fungus.  WHY ME? Fungal infections affect all ages, sexes, races, and creeds.  There may be many factors that predispose you to a fungal infection such as age, coexisting medical conditions such as diabetes, or an autoimmune disease; stress, medications, fatigue, genetics, etc.  Bottom line: fungus thrives in a warm, moist environment and your shoes offer such a location.  IS IT CONTAGIOUS? Theoretically, yes.  You do not want to share shoes, nail clippers or files with someone who has fungal toenails.  Walking around barefoot in the same room or sleeping in the same bed is unlikely to transfer the organism.  It is important to realize, however, that fungus can spread easily from one nail to the next on the same foot.  HOW DO WE TREAT THIS?  There are several ways to treat this condition.  Treatment may depend on many factors such as age, medications, pregnancy, liver and kidney conditions, etc.  It is best to ask your doctor which options are available to you.   No treatment.   Unlike many other medical concerns, you can live with this condition.  However for many people this can be a painful condition and may lead to ingrown toenails or a bacterial infection.  It is recommended that you keep the nails cut short to help reduce the amount of fungal  nail.  Topical treatment.  These range from herbal remedies to prescription strength nail lacquers.  About 40-50% effective, topicals require twice daily application for approximately 9 to 12 months or until an entirely new nail has grown out.  The most effective topicals are medical grade medications available through physicians offices.  Oral antifungal medications.  With an 80-90% cure rate, the most common oral medication requires 3 to 4 months of therapy and stays in your system for a year as the new nail grows out.  Oral antifungal medications do require blood work to make sure it is a safe drug for you.  A liver function panel will be performed prior to starting the medication and after the first month of treatment.  It is important to have the blood work performed to avoid any harmful side effects.  In general, this medication safe but blood work is required.  Laser Therapy.  This treatment is performed by applying a specialized laser to the affected nail plate.  This therapy is noninvasive, fast, and non-painful.  It is not covered by insurance and is therefore, out of pocket.  The results have been very good with a 80-95% cure rate.  [facility] is the only practice in the area to offer this therapy.  Permanent Nail Avulsion.  Removing the entire nail so that a new nail  will not grow back.  Carpal Tunnel Syndrome Carpal tunnel syndrome is a disorder of the nervous system in the wrist that causes pain, hand weakness, and/or loss of feeling. Carpal tunnel syndrome is caused by the compression, stretching, or irritation of the median nerve at the wrist joint. Athletes who experience carpal tunnel syndrome may notice a decrease in their performance to the condition, especially for sports that require strong hand or wrist action.  SYMPTOMS   Tingling, numbness, or burning pain in the hand or fingers.  Inability to sleep due to pain in the hand.  Sharp pains that shoot from the wrist  up the arm or to the fingers, especially at night.  Morning stiffness or cramping of the hand.  Thumb weakness, resulting in difficulty holding objects or making a fist.  Shiny, dry skin on the hand.  Reduced performance in any sport requiring a strong grip. CAUSES   Median nerve damage at the wrist is caused by pressure due to swelling, inflammation, or scarred tissue.  Sources of pressure include:  Repetitive gripping or squeezing that causes inflammation of the tendon sheaths.  Scarring or shortening of the ligament that covers the median nerve.  Traumatic injury to the wrist or forearm such as fracture, sprain, or dislocation.  Prolonged hyperextension (wrist bent backward) or hyperflexion (wrist bent downward) of the wrist. RISK INCREASES WITH:  Diabetes mellitus.  Menopause or amenorrhea.  Rheumatoid arthritis.  Raynaud disease.  Pregnancy.  Gout.  Kidney disease.  Ganglion cyst.  Repetitive hand or wrist action.  Hypothyroidism (underactive thyroid gland).  Repetitive jolting or shaking of the hands or wrist.  Prolonged forceful weight-bearing on the hands. PREVENTION  Bracing the hand and wrist straight during activities that involve repetitive grasping.  For activities that require prolonged extension of the wrist (bending towards the top of the forearm) periodically change the position of your wrists.  Learn and use proper technique in activities that result in the wrist position in neutral to slight extension.  Avoid bending the wrist into full extension or flexion (up or down).  Keep the wrist in a straight (neutral) position. To keep the wrist in this position, wear a splint.  Avoid repetitive hand and wrist motions.  When possible avoid prolonged grasping of items (steering wheel of a car, a pen, a vacuum cleaner, or a rake).  Loosen your grip for activities that require prolonged grasping of items.  Place keyboards and writing surfaces  at the correct height as to decrease strain on the wrist and hand.  Alternate work tasks to avoid prolonged wrist flexion.  Avoid pinching activities (needlework and writing) as they may irritate your carpal tunnel syndrome.  If these activities are necessary, complete them for shorter periods of time.  When writing, use a felt tip or rollerball pen and/or build up the grip on a pen to decrease the forces required for writing. PROGNOSIS  Carpal tunnel syndrome is usually curable with appropriate conservative treatment and sometimes resolves spontaneously. For some cases, surgery is necessary, especially if muscle wasting or nerve changes have developed.  RELATED COMPLICATIONS   Permanent numbness and a weak thumb or fingers in the affected hand.  Permanent paralysis of a portion of the hand and fingers. TREATMENT  Treatment initially consists of stopping activities that aggravate the symptoms as well as medication and ice to reduce inflammation. A wrist splint is often recommended for wear during activities of repetitive motion as well as at night. It is also important to  learn and use proper technique when performing activities that typically cause pain. On occasion, a corticosteroid injection may be given. If symptoms persist despite conservative treatment, surgery may be an option. Surgical techniques free the pinched or compressed nerve. Carpal tunnel surgery is usually performed on an outpatient basis, meaning you go home the same day as surgery. These procedures provide almost complete relief of all symptoms in 95% of patients. Expect at least 2 weeks for healing after surgery. For cases that are the result of repeated jolting or shaking of the hand or wrist or prolonged hyperextension, surgery is not usually recommended because stretching of the median nerve, not compression, is usually the cause of carpal tunnel syndrome in these cases. MEDICATION   If pain medication is necessary,  nonsteroidal anti-inflammatory medications, such as aspirin and ibuprofen, or other minor pain relievers, such as acetaminophen, are often recommended.  Do not take pain medication for 7 days before surgery.  Prescription pain relievers are usually only prescribed after surgery. Use only as directed and only as much as you need.  Corticosteroid injections may be given to reduce inflammation. However, they are not always recommended.  Vitamin B6 (pyridoxine) may reduce symptoms; use only if prescribed for your disorder. SEEK MEDICAL CARE IF:   Symptoms get worse or do not improve in 2 weeks despite treatment.  You also have a current or recent history of neck or shoulder injury that has resulted in pain or tingling elsewhere in your arm. Document Released: 04/11/2005 Document Revised: 08/26/2013 Document Reviewed: 07/24/2008 [facility] Patient Information 2015 [place], [place]. This information is not intended to replace advice given to you by your health care provider. Make sure you discuss any questions you have with your health care provider.

## 2014-10-16 NOTE — Progress Notes (Signed)
Toenail Avulsion Procedure Note  Pre-operative Diagnosis: Left Ingrown/onychomycosis of toenails Great toenail              Carpal tunnel  Post-operative Diagnosis: Left Ingrown Great toenail  Indications: risk infection  Anesthesia: Lidocaine 1% without epinephrine   Procedure Details   Left carpal tunnel compression test is positive, neurovascular exam is normal, no thenar or hypothenar atrophy, Phalen's: positive, strength normal and wrist has normal range of motion  The risks (including bleeding and infection) and benefits of the  procedure and Verbal informed consent obtained.  After digital block anesthesia was obtained.  After prepping with alcohol, the toenail was removed with forceps and debrided.  All visible granulation tissue is debrided. Antibiotic and bulky dressing was applied.   Findings: onychomycosis of toenails Carpal tunnel  Complications: none.  Plan: 1.  Change dressing twice daily until healed over. 2. Warning signs of infection were reviewed.   3. Recommended that the patient use OTC acetaminophen as needed for pain.  4. Return in 6 weeks  5. Get carpal tunnel splint for at night, normal neuro, + carpal tunnel, may need to refer to ortho. Marland Kitchen

## 2014-11-04 ENCOUNTER — Other Ambulatory Visit: Payer: Self-pay | Admitting: *Deleted

## 2014-11-04 ENCOUNTER — Other Ambulatory Visit: Payer: Self-pay

## 2014-11-04 MED ORDER — FENOFIBRATE MICRONIZED 134 MG PO CAPS: ORAL_CAPSULE | ORAL | Status: DC

## 2014-11-04 MED ORDER — MELOXICAM 15 MG PO TABS: ORAL_TABLET | ORAL | Status: DC

## 2014-11-11 DIAGNOSIS — H3532 Exudative age-related macular degeneration: Secondary | ICD-10-CM | POA: Diagnosis not present

## 2014-12-01 ENCOUNTER — Other Ambulatory Visit: Payer: Self-pay | Admitting: Physician Assistant

## 2014-12-01 ENCOUNTER — Other Ambulatory Visit: Payer: Self-pay | Admitting: *Deleted

## 2014-12-01 MED ORDER — PRAVASTATIN SODIUM 40 MG PO TABS: ORAL_TABLET | ORAL | Status: DC

## 2014-12-01 MED ORDER — ROPINIROLE HCL 3 MG PO TABS: ORAL_TABLET | ORAL | Status: DC

## 2014-12-12 ENCOUNTER — Other Ambulatory Visit: Payer: Self-pay | Admitting: Internal Medicine

## 2014-12-12 DIAGNOSIS — I10 Essential (primary) hypertension: Secondary | ICD-10-CM

## 2014-12-16 DIAGNOSIS — E119 Type 2 diabetes mellitus without complications: Secondary | ICD-10-CM | POA: Diagnosis not present

## 2014-12-16 DIAGNOSIS — Z961 Presence of intraocular lens: Secondary | ICD-10-CM | POA: Diagnosis not present

## 2014-12-16 DIAGNOSIS — H26491 Other secondary cataract, right eye: Secondary | ICD-10-CM | POA: Diagnosis not present

## 2014-12-16 DIAGNOSIS — H3531 Nonexudative age-related macular degeneration: Secondary | ICD-10-CM | POA: Diagnosis not present

## 2014-12-16 DIAGNOSIS — H3532 Exudative age-related macular degeneration: Secondary | ICD-10-CM | POA: Diagnosis not present

## 2014-12-24 DIAGNOSIS — M7071 Other bursitis of hip, right hip: Secondary | ICD-10-CM | POA: Diagnosis not present

## 2014-12-30 DIAGNOSIS — M7071 Other bursitis of hip, right hip: Secondary | ICD-10-CM | POA: Diagnosis not present

## 2015-01-01 ENCOUNTER — Encounter: Payer: Self-pay | Admitting: Internal Medicine

## 2015-01-01 ENCOUNTER — Ambulatory Visit (INDEPENDENT_AMBULATORY_CARE_PROVIDER_SITE_OTHER): Payer: Medicare Other | Admitting: Internal Medicine

## 2015-01-01 VITALS — BP 152/64 | HR 54 | Temp 98.2°F | Resp 18 | Ht 64.0 in | Wt 137.0 lb

## 2015-01-01 DIAGNOSIS — E782 Mixed hyperlipidemia: Secondary | ICD-10-CM | POA: Diagnosis not present

## 2015-01-01 DIAGNOSIS — I10 Essential (primary) hypertension: Secondary | ICD-10-CM

## 2015-01-01 DIAGNOSIS — E559 Vitamin D deficiency, unspecified: Secondary | ICD-10-CM | POA: Diagnosis not present

## 2015-01-01 DIAGNOSIS — E1029 Type 1 diabetes mellitus with other diabetic kidney complication: Secondary | ICD-10-CM

## 2015-01-01 DIAGNOSIS — Z79899 Other long term (current) drug therapy: Secondary | ICD-10-CM | POA: Diagnosis not present

## 2015-01-01 LAB — LIPID PANEL
Cholesterol: 146 mg/dL (ref 125–200)
HDL: 58 mg/dL
LDL Cholesterol: 58 mg/dL
Total CHOL/HDL Ratio: 2.5 ratio
Triglycerides: 152 mg/dL — ABNORMAL HIGH
VLDL: 30 mg/dL

## 2015-01-01 LAB — CBC WITH DIFFERENTIAL/PLATELET
Basophils Absolute: 0 10*3/uL (ref 0.0–0.1)
Basophils Relative: 0 % (ref 0–1)
Eosinophils Absolute: 0.1 10*3/uL (ref 0.0–0.7)
Eosinophils Relative: 1 % (ref 0–5)
HCT: 32.6 % — ABNORMAL LOW (ref 36.0–46.0)
Hemoglobin: 10.7 g/dL — ABNORMAL LOW (ref 12.0–15.0)
Lymphocytes Relative: 25 % (ref 12–46)
Lymphs Abs: 2.9 10*3/uL (ref 0.7–4.0)
MCH: 30.7 pg (ref 26.0–34.0)
MCHC: 32.8 g/dL (ref 30.0–36.0)
MCV: 93.7 fL (ref 78.0–100.0)
MPV: 11.4 fL (ref 8.6–12.4)
Monocytes Absolute: 1.1 10*3/uL — ABNORMAL HIGH (ref 0.1–1.0)
Monocytes Relative: 9 % (ref 3–12)
Neutro Abs: 7.6 10*3/uL (ref 1.7–7.7)
Neutrophils Relative %: 65 % (ref 43–77)
Platelets: 293 10*3/uL (ref 150–400)
RBC: 3.48 MIL/uL — ABNORMAL LOW (ref 3.87–5.11)
RDW: 13.7 % (ref 11.5–15.5)
WBC: 11.7 10*3/uL — ABNORMAL HIGH (ref 4.0–10.5)

## 2015-01-01 LAB — BASIC METABOLIC PANEL WITH GFR
BUN: 29 mg/dL — ABNORMAL HIGH (ref 7–25)
CO2: 28 mmol/L (ref 20–31)
Calcium: 9.8 mg/dL (ref 8.6–10.4)
Chloride: 101 mmol/L (ref 98–110)
Creat: 1.46 mg/dL — ABNORMAL HIGH (ref 0.60–0.88)
GFR, Est African American: 38 mL/min — ABNORMAL LOW (ref >=60)
GFR, Est Non African American: 33 mL/min — ABNORMAL LOW (ref >=60)
Glucose, Bld: 244 mg/dL — ABNORMAL HIGH (ref 65–99)
Potassium: 4.7 mmol/L (ref 3.5–5.3)
Sodium: 140 mmol/L (ref 135–146)

## 2015-01-01 LAB — HEPATIC FUNCTION PANEL
ALT: 17 U/L (ref 6–29)
AST: 20 U/L (ref 10–35)
Albumin: 4.3 g/dL (ref 3.6–5.1)
Alkaline Phosphatase: 49 U/L (ref 33–130)
Bilirubin, Direct: 0.1 mg/dL (ref <=0.2)
Indirect Bilirubin: 0.2 mg/dL (ref 0.2–1.2)
Total Bilirubin: 0.3 mg/dL (ref 0.2–1.2)
Total Protein: 6.9 g/dL (ref 6.1–8.1)

## 2015-01-01 LAB — MAGNESIUM: Magnesium: 2.2 mg/dL (ref 1.5–2.5)

## 2015-01-01 LAB — TSH: TSH: 0.755 u[IU]/mL (ref 0.350–4.500)

## 2015-01-01 NOTE — Patient Instructions (Signed)
Linaclotide oral capsules What is this medicine? LINACLOTIDE (lin a KLOE tide) is used to treat irritable bowel syndrome (IBS) with constipation as the main problem. It may also be used for relief of chronic constipation. This medicine may be used for other purposes; ask your health care provider or pharmacist if you have questions. COMMON BRAND NAME(S): Linzess What should I tell my health care provider before I take this medicine? They need to know if you have any of these conditions: -history of stool (fecal) impaction -now have diarrhea or have diarrhea often -other medical condition -stomach or intestinal disease, including bowel obstruction or abdominal adhesions -an unusual or allergic reaction to linaclotide, other medicines, foods, dyes, or preservatives -pregnant or trying to get pregnant -breast-feeding How should I use this medicine? Take this medicine by mouth with a glass of water. Follow the directions on the prescription label. Do not cut, crush or chew this medicine. Take on an empty stomach, at least 30 minutes before your first meal of the day. Take your medicine at regular intervals. Do not take your medicine more often than directed. Do not stop taking except on your doctor's advice. A special MedGuide will be given to you by the pharmacist with each prescription and refill. Be sure to read this information carefully each time. Talk to your pediatrician regarding the use of this medicine in children. This medicine is not approved for use in children. Overdosage: If you think you've taken too much of this medicine contact a poison control center or emergency room at once. Overdosage: If you think you have taken too much of this medicine contact a poison control center or emergency room at once. NOTE: This medicine is only for you. Do not share this medicine with others. What if I miss a dose? If you miss a dose, just skip that dose. Wait until your next dose, and take only  that dose. Do not take double or extra doses. What may interact with this medicine? -certain medicines for bowel problems or bladder incontinence (these can cause constipation) This list may not describe all possible interactions. Give your health care provider a list of all the medicines, herbs, non-prescription drugs, or dietary supplements you use. Also tell them if you smoke, drink alcohol, or use illegal drugs. Some items may interact with your medicine. What should I watch for while using this medicine? Visit your doctor for regular check ups. Tell your doctor if your symptoms do not get better or if they get worse. Diarrhea is a common side effect of this medicine. It often begins within 2 weeks of starting this medicine. Stop taking this medicine and call your doctor if you get severe diarrhea. Stop taking this medicine and call your doctor or go to the nearest hospital emergency room right away if you develop unusual or severe stomach-area (abdominal) pain, especially if you also have bright red, bloody stools or black stools that look like tar. What side effects may I notice from receiving this medicine? Side effects that you should report to your doctor or health care professional as soon as possible: -allergic reactions like skin rash, itching or hives, swelling of the face, lips, or tongue -black, tarry stools -bloody or watery diarrhea -new or worsening stomach pain -severe or prolonged diarrhea Side effects that usually do not require medical attention (Report these to your doctor or health care professional if they continue or are bothersome.): -bloating -gas -loose stools This list may not describe all possible side effects.  Call your doctor for medical advice about side effects. You may report side effects to FDA at 1-800-FDA-1088. Where should I keep my medicine? Keep out of the reach of children. Store at room temperature between 20 and 25 degrees C (68 and 77 degrees F).  Keep this medicine in the original container. Keep tightly closed in a dry place. Do not remove the desiccant packet from the bottle, it helps to protect your medicine from moisture. Throw away any unused medicine after the expiration date. NOTE: This sheet is a summary. It may not cover all possible information. If you have questions about this medicine, talk to your doctor, pharmacist, or health care provider.  2015, Elsevier/Gold Standard. (2010-12-28 13:05:27)

## 2015-01-01 NOTE — Progress Notes (Signed)
Patient ID: [name], female   DOB: [birth year], 81 y.o.   MRN: [id number]  Assessment and Plan:  Hypertension:  -Continue medication -monitor blood pressure at home. -Continue DASH diet -Reminder to go to the ER if any CP, SOB, nausea, dizziness, severe HA, changes vision/speech, left arm numbness and tingling and jaw pain.  Cholesterol - Continue diet and exercise -Check cholesterol.   Diabetes with diabetic chronic kidney disease  -lantus samples given -Continue diet and exercise.  -Check A1C  Vitamin D Def -check level -continue medications.   Constipation -linzess samples given -if she likes we will send that in  Continue diet and meds as discussed. Further disposition pending results of labs. Discussed med's effects and SE's.    HPI 81 y.o. female  presents for 3 month follow up with hypertension, hyperlipidemia, diabetes and vitamin D deficiency.   Her blood pressure has been controlled at home, today their BP is BP: (!) 152/64 mmHg.She does not workout. She denies chest pain, shortness of breath, dizziness.  She has been having some issues with with her sciatic pain.     She is on cholesterol medication and denies myalgias. Her cholesterol is at goal. The cholesterol was:  09/18/2014: Cholesterol 137; HDL 54; LDL Cholesterol 60; Triglycerides 114   She has been working on diet and exercise for diabetes with diabetic chronic kidney disease, she is on bASA, she is on ACE/ARB, and denies  foot ulcerations, hyperglycemia, hypoglycemia , increased appetite, nausea, paresthesia of the feet, polydipsia, polyuria, visual disturbances, vomiting and weight loss. Last A1C was: 09/18/2014: Hgb A1c MFr Bld 7.2*   Patient is on Vitamin D supplement. 09/18/2014: Vit D, 25-Hydroxy 36    Current Medications:  Current Outpatient Prescriptions on File Prior to Visit  Medication Sig Dispense Refill  . ACCU-CHEK AVIVA PLUS test strip     . Alcohol Swabs (ALCOHOL PREP) 70 % PADS      . aspirin 81 MG tablet Take 81 mg by mouth daily.    . ASSURE COMFORT LANCETS 30G MISC     . bisoprolol-hydrochlorothiazide (ZIAC) 5-6.25 MG per tablet Take 1 tablet by mouth daily. For BP 90 tablet 3  . Blood Glucose Calibration (ACCU-CHEK AVIVA) SOLN     . Blood Glucose Monitoring Suppl (ACCU-CHEK AVIVA PLUS) W/DEVICE KIT     . Cholecalciferol (VITAMIN D3) 2000 UNITS TABS Take 1 capsule by mouth 2 (two) times daily.    . fenofibrate micronized (LOFIBRA) 134 MG capsule TAKE ONE CAPSULE BY MOUTH DAILY FOR BLOOD FATS 90 capsule 98  . gabapentin (NEURONTIN) 300 MG capsule TAKE ONE CAPSULE BY MOUTH THREE(3) TO FOUR(4)  TIMES DAILY FOR DIABETIC NEUROPATHY PAIN 360 capsule 2  . Insulin Glargine (LANTUS) 100 UNIT/ML Solostar Pen Inject 30 Units into the skin at bedtime. or as directed to control blood sugars 15 mL 3  . Insulin Isophane & Regular Human (HUMULIN 70/30 PEN) (70-30) 100 UNIT/ML PEN Inject 5-10 units Page tid  with meals,  on a sliding scale.  DX-10.29 15 mL 11  . losartan (COZAAR) 100 MG tablet TAKE 1 TABLET BY MOUTH DAILY FOR HIGH BLOOD PRESSURE, HEART, AND KIDNEYS 90 tablet 1  . Magnesium 500 MG CAPS Take 500 mg by mouth 2 (two) times daily.    . meloxicam (MOBIC) 15 MG tablet 1/2 to 1 tablet daily with food for pain and inflammation 90 tablet 1  . metFORMIN (GLUCOPHAGE) 1000 MG tablet TAKE ONE TABLET TWO TIMES A DAY AFTER MEALS FOR  DIABETES 60 tablet 3  . Multiple Vitamins-Minerals (HAIR/SKIN/NAILS) TABS Take by mouth 2 (two) times daily.    . pravastatin (PRAVACHOL) 40 MG tablet TAKE 1 TABLET BY MOUTH AT BEDTIME FOR CHOLESTEROL 90 tablet 1  . rOPINIRole (REQUIP) 3 MG tablet TAKE 1 TABLET BY MOUTH TWICE DAILY AND TAKE TWO TABLETS AT BEDTIME FOR RESTLESS  LEGS 120 tablet 2  . terbinafine (LAMISIL) 250 MG tablet Take 1 tablet (250 mg total) by mouth daily. Take one daily for 3 months, need liver function lab at 6 weeks. 90 tablet 0  . traMADol (ULTRAM) 50 MG tablet Take 1/2 to 1 tablet 4 x  day or every 4 hours if needed for severe pain 100 tablet 0   No current facility-administered medications on file prior to visit.   Medical History:  Past Medical History  Diagnosis Date  . Liver lesion   . Chronic back pain   . Neuropathy   . Diabetic neuropathy   . Wears glasses   . Type II or unspecified type diabetes mellitus without mention of complication, not stated as uncontrolled   . Hypercholesteremia   . Hypertension   . Arthritis   . Vitamin D deficiency    Allergies:  Allergies  Allergen Reactions  . Morphine And Related Other (See Comments)    fuzzy feeling     Review of Systems:  Review of Systems  Constitutional: Positive for malaise/fatigue. Negative for fever and chills.  HENT: Negative for congestion, ear pain and sore throat.   Eyes: Negative.   Respiratory: Negative for cough, shortness of breath and wheezing.   Cardiovascular: Negative for chest pain, palpitations and leg swelling.  Gastrointestinal: Positive for constipation. Negative for heartburn, diarrhea, blood in stool and melena.  Genitourinary: Negative.   Skin: Negative.   Neurological: Negative for dizziness, sensory change, loss of consciousness and headaches.  Psychiatric/Behavioral: Negative for depression. The patient is not nervous/anxious and does not have insomnia.     Family history- Review and unchanged  Social history- Review and unchanged  Physical Exam: BP 152/64 mmHg  Pulse 54  Temp(Src) 98.2 F (36.8 C) (Temporal)  Resp 18  Ht 5' 4"  (1.626 m)  Wt 137 lb (62.143 kg)  BMI 23.50 kg/m2 Wt Readings from Last 3 Encounters:  01/01/15 137 lb (62.143 kg)  10/16/14 133 lb (60.328 kg)  09/18/14 136 lb (61.689 kg)   General Appearance: Well nourished well developed, non-toxic appearing, in no apparent distress. Eyes: PERRLA, EOMs, conjunctiva no swelling or erythema ENT/Mouth: Ear canals clear with no erythema, swelling, or discharge.  TMs normal bilaterally, oropharynx  clear, moist, with no exudate.   Neck: Supple, thyroid normal, no JVD, no cervical adenopathy.  Respiratory: Respiratory effort normal, breath sounds clear A&P, no wheeze, rhonchi or rales noted.  No retractions, no accessory muscle usage Cardio: RRR with no MRGs. No noted edema.  Abdomen: Soft, + BS.  Non tender, no guarding, rebound, masses.  Small umbilical hernia Musculoskeletal: Full ROM, 5/5 strength, Normal gait Skin: Warm, dry without rashes, lesions, ecchymosis.  Neuro: Awake and oriented X 3, Cranial nerves intact. No cerebellar symptoms.  Psych: normal affect, Insight and Judgment appropriate.    Starlyn Skeans, PA-C 10:08 AM [facility]

## 2015-01-02 LAB — INSULIN, RANDOM: Insulin: 35.9 u[IU]/mL — ABNORMAL HIGH (ref 2.0–19.6)

## 2015-01-02 LAB — HEMOGLOBIN A1C
Hgb A1c MFr Bld: 7.6 % — ABNORMAL HIGH (ref <5.7)
Mean Plasma Glucose: 171 mg/dL — ABNORMAL HIGH (ref <117)

## 2015-01-02 LAB — VITAMIN D 25 HYDROXY (VIT D DEFICIENCY, FRACTURES): Vit D, 25-Hydroxy: 48 ng/mL (ref 30–100)

## 2015-01-10 ENCOUNTER — Other Ambulatory Visit: Payer: Self-pay | Admitting: Internal Medicine

## 2015-01-10 ENCOUNTER — Other Ambulatory Visit: Payer: Self-pay | Admitting: Physician Assistant

## 2015-01-14 DIAGNOSIS — M5020 Other cervical disc displacement, unspecified cervical region: Secondary | ICD-10-CM | POA: Diagnosis not present

## 2015-01-14 DIAGNOSIS — M47816 Spondylosis without myelopathy or radiculopathy, lumbar region: Secondary | ICD-10-CM | POA: Diagnosis not present

## 2015-01-14 DIAGNOSIS — M461 Sacroiliitis, not elsewhere classified: Secondary | ICD-10-CM | POA: Diagnosis not present

## 2015-01-27 DIAGNOSIS — H353211 Exudative age-related macular degeneration, right eye, with active choroidal neovascularization: Secondary | ICD-10-CM | POA: Diagnosis not present

## 2015-01-31 ENCOUNTER — Encounter: Payer: Self-pay | Admitting: *Deleted

## 2015-02-10 ENCOUNTER — Encounter: Payer: Self-pay | Admitting: Gastroenterology

## 2015-02-12 ENCOUNTER — Other Ambulatory Visit: Payer: Self-pay

## 2015-02-12 DIAGNOSIS — Z1231 Encounter for screening mammogram for malignant neoplasm of breast: Secondary | ICD-10-CM

## 2015-02-20 DIAGNOSIS — M5020 Other cervical disc displacement, unspecified cervical region: Secondary | ICD-10-CM | POA: Diagnosis not present

## 2015-02-20 DIAGNOSIS — M47816 Spondylosis without myelopathy or radiculopathy, lumbar region: Secondary | ICD-10-CM | POA: Diagnosis not present

## 2015-02-20 DIAGNOSIS — M461 Sacroiliitis, not elsewhere classified: Secondary | ICD-10-CM | POA: Diagnosis not present

## 2015-02-25 ENCOUNTER — Other Ambulatory Visit: Payer: Self-pay | Admitting: Physician Assistant

## 2015-02-27 ENCOUNTER — Telehealth: Payer: Self-pay | Admitting: *Deleted

## 2015-02-27 NOTE — Telephone Encounter (Signed)
Called and followed up on patient in regard to having diarrhea earlier this week.  She states her diarrhea has subsided and she feels better.

## 2015-03-02 ENCOUNTER — Other Ambulatory Visit: Payer: Self-pay | Admitting: *Deleted

## 2015-03-02 MED ORDER — PRAVASTATIN SODIUM 40 MG PO TABS: ORAL_TABLET | ORAL | Status: DC

## 2015-03-11 ENCOUNTER — Other Ambulatory Visit: Payer: Self-pay | Admitting: *Deleted

## 2015-03-11 ENCOUNTER — Ambulatory Visit (INDEPENDENT_AMBULATORY_CARE_PROVIDER_SITE_OTHER): Payer: Medicare Other | Admitting: *Deleted

## 2015-03-11 ENCOUNTER — Encounter: Payer: Self-pay | Admitting: Internal Medicine

## 2015-03-11 DIAGNOSIS — Z23 Encounter for immunization: Secondary | ICD-10-CM

## 2015-03-11 DIAGNOSIS — I10 Essential (primary) hypertension: Secondary | ICD-10-CM

## 2015-03-11 MED ORDER — ACCU-CHEK AVIVA PLUS VI STRP: ORAL_STRIP | Status: DC

## 2015-03-11 MED ORDER — LOSARTAN POTASSIUM 100 MG PO TABS: ORAL_TABLET | ORAL | Status: DC

## 2015-03-11 MED ORDER — ROPINIROLE HCL 3 MG PO TABS: ORAL_TABLET | ORAL | Status: DC

## 2015-03-11 MED ORDER — INSULIN GLARGINE 100 UNIT/ML SOLOSTAR PEN: PEN_INJECTOR | SUBCUTANEOUS | Status: DC

## 2015-03-11 MED ORDER — INSULIN ISOPHANE & REGULAR (HUMAN 70-30)100 UNIT/ML KWIKPEN: PEN_INJECTOR | SUBCUTANEOUS | Status: DC

## 2015-03-11 MED ORDER — TRAMADOL HCL 50 MG PO TABS: ORAL_TABLET | ORAL | Status: DC

## 2015-03-11 MED ORDER — FENOFIBRATE MICRONIZED 134 MG PO CAPS: ORAL_CAPSULE | ORAL | Status: DC

## 2015-03-11 MED ORDER — ASSURE COMFORT LANCETS 30G MISC: Status: DC

## 2015-03-11 MED ORDER — ACCU-CHEK AVIVA VI SOLN: Status: DC

## 2015-03-11 MED ORDER — MELOXICAM 15 MG PO TABS: ORAL_TABLET | ORAL | Status: DC

## 2015-03-11 MED ORDER — PRAVASTATIN SODIUM 40 MG PO TABS: ORAL_TABLET | ORAL | Status: DC

## 2015-03-11 MED ORDER — FLUTICASONE PROPIONATE 50 MCG/ACT NA SUSP: 2.0000 | Freq: Every day | NASAL | Status: DC

## 2015-03-11 MED ORDER — GABAPENTIN 300 MG PO CAPS: ORAL_CAPSULE | ORAL | Status: DC

## 2015-03-11 MED ORDER — METFORMIN HCL 1000 MG PO TABS: ORAL_TABLET | ORAL | Status: DC

## 2015-03-11 MED ORDER — BISOPROLOL-HYDROCHLOROTHIAZIDE 5-6.25 MG PO TABS: 1.0000 | ORAL_TABLET | Freq: Every day | ORAL | Status: DC

## 2015-03-13 ENCOUNTER — Ambulatory Visit
Admission: RE | Admit: 2015-03-13 | Discharge: 2015-03-13 | Disposition: A | Discharge status: Home / Self Care | Payer: Medicare Other | Source: Ambulatory Visit

## 2015-03-13 DIAGNOSIS — Z1231 Encounter for screening mammogram for malignant neoplasm of breast: Secondary | ICD-10-CM | POA: Diagnosis not present

## 2015-04-08 DIAGNOSIS — H353211 Exudative age-related macular degeneration, right eye, with active choroidal neovascularization: Secondary | ICD-10-CM | POA: Diagnosis not present

## 2015-04-08 DIAGNOSIS — H353221 Exudative age-related macular degeneration, left eye, with active choroidal neovascularization: Secondary | ICD-10-CM | POA: Diagnosis not present

## 2015-04-08 DIAGNOSIS — E119 Type 2 diabetes mellitus without complications: Secondary | ICD-10-CM | POA: Diagnosis not present

## 2015-04-08 DIAGNOSIS — H35362 Drusen (degenerative) of macula, left eye: Secondary | ICD-10-CM | POA: Diagnosis not present

## 2015-04-08 DIAGNOSIS — H353231 Exudative age-related macular degeneration, bilateral, with active choroidal neovascularization: Secondary | ICD-10-CM | POA: Diagnosis not present

## 2015-04-08 DIAGNOSIS — H353133 Nonexudative age-related macular degeneration, bilateral, advanced atrophic without subfoveal involvement: Secondary | ICD-10-CM | POA: Diagnosis not present

## 2015-04-08 DIAGNOSIS — H35352 Cystoid macular degeneration, left eye: Secondary | ICD-10-CM | POA: Diagnosis not present

## 2015-04-08 DIAGNOSIS — Z961 Presence of intraocular lens: Secondary | ICD-10-CM | POA: Diagnosis not present

## 2015-04-15 ENCOUNTER — Encounter: Payer: Self-pay | Admitting: Internal Medicine

## 2015-04-15 ENCOUNTER — Ambulatory Visit (INDEPENDENT_AMBULATORY_CARE_PROVIDER_SITE_OTHER): Payer: Medicare Other | Admitting: Internal Medicine

## 2015-04-15 ENCOUNTER — Other Ambulatory Visit: Payer: Self-pay | Admitting: Internal Medicine

## 2015-04-15 VITALS — BP 136/74 | HR 60 | Temp 97.7°F | Resp 16 | Ht 64.0 in | Wt 135.8 lb

## 2015-04-15 DIAGNOSIS — Z1212 Encounter for screening for malignant neoplasm of rectum: Secondary | ICD-10-CM

## 2015-04-15 DIAGNOSIS — I10 Essential (primary) hypertension: Secondary | ICD-10-CM

## 2015-04-15 DIAGNOSIS — E1040 Type 1 diabetes mellitus with diabetic neuropathy, unspecified: Secondary | ICD-10-CM

## 2015-04-15 DIAGNOSIS — E559 Vitamin D deficiency, unspecified: Secondary | ICD-10-CM | POA: Diagnosis not present

## 2015-04-15 DIAGNOSIS — Z9181 History of falling: Secondary | ICD-10-CM

## 2015-04-15 DIAGNOSIS — E1022 Type 1 diabetes mellitus with diabetic chronic kidney disease: Secondary | ICD-10-CM | POA: Diagnosis not present

## 2015-04-15 DIAGNOSIS — E782 Mixed hyperlipidemia: Secondary | ICD-10-CM

## 2015-04-15 DIAGNOSIS — Z0000 Encounter for general adult medical examination without abnormal findings: Secondary | ICD-10-CM | POA: Diagnosis not present

## 2015-04-15 DIAGNOSIS — Z1331 Encounter for screening for depression: Secondary | ICD-10-CM

## 2015-04-15 DIAGNOSIS — Z79899 Other long term (current) drug therapy: Secondary | ICD-10-CM

## 2015-04-15 DIAGNOSIS — Z0001 Encounter for general adult medical examination with abnormal findings: Secondary | ICD-10-CM

## 2015-04-15 DIAGNOSIS — N189 Chronic kidney disease, unspecified: Secondary | ICD-10-CM | POA: Diagnosis not present

## 2015-04-15 LAB — CBC WITH DIFFERENTIAL/PLATELET
Basophils Absolute: 0.1 10*3/uL (ref 0.0–0.1)
Basophils Relative: 1 % (ref 0–1)
Eosinophils Absolute: 0.5 10*3/uL (ref 0.0–0.7)
Eosinophils Relative: 6 % — ABNORMAL HIGH (ref 0–5)
HCT: 36.5 % (ref 36.0–46.0)
Hemoglobin: 12 g/dL (ref 12.0–15.0)
Lymphocytes Relative: 30 % (ref 12–46)
Lymphs Abs: 2.4 10*3/uL (ref 0.7–4.0)
MCH: 30.9 pg (ref 26.0–34.0)
MCHC: 32.9 g/dL (ref 30.0–36.0)
MCV: 94.1 fL (ref 78.0–100.0)
MPV: 11.7 fL (ref 8.6–12.4)
Monocytes Absolute: 0.6 10*3/uL (ref 0.1–1.0)
Monocytes Relative: 8 % (ref 3–12)
Neutro Abs: 4.5 10*3/uL (ref 1.7–7.7)
Neutrophils Relative %: 55 % (ref 43–77)
Platelets: 315 10*3/uL (ref 150–400)
RBC: 3.88 MIL/uL (ref 3.87–5.11)
RDW: 12.9 % (ref 11.5–15.5)
WBC: 8.1 10*3/uL (ref 4.0–10.5)

## 2015-04-15 NOTE — Patient Instructions (Signed)

## 2015-04-15 NOTE — Progress Notes (Signed)
Patient ID: [name], female   DOB: 31-Oct-1929, 81 y.o.   MRN: [id number]  Annual Screening/Preventative Visit And Comprehensive Evaluation &  Examination  This very nice 81 y.o. MWF presents for presents for a Wellness/Preventative Visit & comprehensive evaluation and management of multiple medical co-morbidities.  Patient has been followed for HTN, Insulin req T2_DM w/CKD, Hyperlipidemia and Vitamin D Deficiency.    HTN predates since 2008. [name] BP has been controlled at home and patient denies any cardiac symptoms as chest pain, palpitations, shortness of breath, dizziness or ankle swelling. Today's BP: 136/74 mmHg    [name] hyperlipidemia is controlled with diet and medications. Patient denies myalgias or other medication SE's. Last lipids were at goal with  Cholesterol 146; HDL 58; LDL 58; Triglycerides 152 on 01/01/2015.   Patient has Ins req T2_DM prediabetes predating since 1996 and has CKD 2 and painful peripheral sensory neuropathy  and patient denies reactive hypoglycemic symptoms, visual blurring or  diabetic polys. She currently is on Metformin and a base of Lantus 22 units qhs and infrequently ha to cover with her SSI - Humulin 70/30 mix (only 3 x in the last month). Last A1c was 7.6% on 01/01/2015.   Finally, patient has history of Vitamin D Deficiency and last Vitamin D was 48 on 01/01/2015.    Medication Sig  . aspirin 81 MG tablet Take 81 mg by mouth daily.  . bisoprolol-hctz (ZIAC) 5-6.25 MG tablet Take 1 tablet by mouth daily. For BP  . VITAMIN D 2000 UNITS  Take 1 capsule by mouth 2 (two) times daily.  . fenofibrate  134 MG capsule TAKE ONE CAPSULE BY MOUTH DAILY FOR BLOOD FATS  . FLONASE nasal spray Place 2 sprays into both nostrils daily.  Marland Kitchen gabapentin  300 MG capsule TAKE 1 CAPSULE BY MOUTH THREE TIMES DAILY TO FOUR TIMES DAILY FOR DIABETIC NEUROPATHY PAIN  . LANTUS) 100 UNIT/ML Solostar  Inject 50 units at bedtime or as directed to control blood sugars.  DX-E11.22   . HUMULIN 70/30 PEN  Inject 5-10 units Elmer tid  with meals,  on a sliding scale.  DX-11.22  . losartan 100 MG tablet TAKE 1 TABLET BY MOUTH DAILY FOR HIGH BLOOD PRESSURE, HEART, AND KIDNEYS  . Magnesium 500 MG CAPS Take 500 mg by mouth 2 (two) times daily.  . meloxicam 15 MG tablet 1/2 to 1 tablet daily with food for pain and inflammation  . metFORMIN  1000 MG tablet TAKE ONE TABLET TWO TIMES A DAY AFTER MEALS FOR DIABETES  .  (HAIR/SKIN/NAILS Take by mouth 2 (two) times daily.  . pravastatin (PRAVACHOL) 40 MG tablet TAKE 1 TABLET BY MOUTH AT BEDTIME FOR CHOLESTEROL  . rOPINIRole (REQUIP) 3 MG tablet TAKE 1 TABLET BY MOUTH TWICE DAILY AND TAKE TWO TABLETS AT BEDTIME FOR RESTLESS  LEGS  . traMADol (ULTRAM) 50 MG tablet Take 1/2 to 1 tablet 4 x day or every 4 hours if needed for severe pain   Allergies  Allergen Reactions  . Morphine And Related Other (See Comments)    fuzzy feeling   Past Medical History  Diagnosis Date  . Liver lesion   . Chronic back pain   . Neuropathy ([place])   . Diabetic neuropathy ([place])   . Wears glasses   . Type II or unspecified type diabetes mellitus without mention of complication, not stated as uncontrolled   . Hypercholesteremia   . Hypertension   . Arthritis   . Vitamin D deficiency  Health Maintenance  Topic Date Due  . TETANUS/TDAP  01/22/1951  . ZOSTAVAX  01/22/1992  . HEMOGLOBIN A1C  07/01/2015  . FOOT EXAM  09/18/2015  . INFLUENZA VACCINE  11/24/2015  . OPHTHALMOLOGY EXAM  12/16/2015  . DEXA SCAN  Completed  . PNA vac Low Risk Adult  Completed   Immunization History  Administered Date(s) Administered  . DT 01/04/2011  . Influenza Split 01/23/2013  . Influenza, High Dose Seasonal PF 02/27/2014, 03/11/2015  . Pneumococcal Conjugate-13 02/27/2014  . Pneumococcal Polysaccharide-23 01/06/2012   Past Surgical History  Procedure Laterality Date  . Abdominal hysterectomy    . Cesarean section      x 3   . Back surgery  1993    lumb  lam-fusion  . Appendectomy    . Colonoscopy    . Eye surgery      both cataracts  . Trigger finger release  01/10/2012    Procedure: RELEASE TRIGGER FINGER/A-1 PULLEY;  Surgeon: Hessie Dibble, MD;  Location: [facility];  Service: Orthopedics;  Laterality: Right;  right ring trigger release   Family History  Problem Relation Age of Onset  . Colon cancer Neg Hx   . Liver disease Sister   . Diabetes Sister    Social History  Substance Use Topics  . Smoking status: Never Smoker   . Smokeless tobacco: Never Used  . Alcohol Use: No    ROS Constitutional: Denies fever, chills, weight loss/gain, headaches, insomnia,  night sweats, and change in appetite. Does c/o fatigue. Eyes: Denies redness, blurred vision, diplopia, discharge, itchy, watery eyes.  ENT: Denies discharge, congestion, post nasal drip, epistaxis, sore throat, earache, hearing loss, dental pain, Tinnitus, Vertigo, Sinus pain, snoring.  Cardio: Denies chest pain, palpitations, irregular heartbeat, syncope, dyspnea, diaphoresis, orthopnea, PND, claudication, edema Respiratory: denies cough, dyspnea, DOE, pleurisy, hoarseness, laryngitis, wheezing.  Gastrointestinal: Denies dysphagia, heartburn, reflux, water brash, pain, cramps, nausea, vomiting, bloating, diarrhea, constipation, hematemesis, melena, hematochezia, jaundice, hemorrhoids Genitourinary: Denies dysuria, frequency, urgency, nocturia, hesitancy, discharge, hematuria, flank pain Breast: Breast lumps, nipple discharge, bleeding.  Musculoskeletal: Denies arthralgia, myalgia, stiffness, Jt. Swelling, pain, limp, and strain/sprain. Denies falls. Skin: Denies puritis, rash, hives, warts, acne, eczema, changing in skin lesion Neuro: No weakness, tremor, incoordination, spasms,but has burning dysthesias of the feet and legs.  Psychiatric: Denies confusion, memory loss, sensory loss. Denies Depression. Endocrine: Denies change in weight, skin, hair change,  nocturia, and paresthesia, diabetic polys, visual blurring, hyper / hypo glycemic episodes.  Heme/Lymph: No excessive bleeding, bruising, enlarged lymph nodes.  Physical Exam  BP 136/74 mmHg  Pulse 60  Temp(Src) 97.7 F (36.5 C)  Resp 16  Ht 5\' 4"  (1.626 m)  Wt 135 lb 12.8 oz (61.598 kg)  BMI 23.30 kg/m2  General Appearance: Well nourished and in no apparent distress.  Eyes: PERRLA, EOMs, conjunctiva no swelling or erythema, normal fundi and vessels. Sinuses: No frontal/maxillary tenderness ENT/Mouth: EACs patent / TMs  nl. Nares clear without erythema, swelling, mucoid exudates. Oral hygiene is good. No erythema, swelling, or exudate. Tongue normal, non-obstructing. Tonsils not swollen or erythematous. Hearing normal.  Neck: Supple, thyroid normal. No bruits, nodes or JVD. Respiratory: Respiratory effort normal.  BS equal and clear bilateral without rales, rhonci, wheezing or stridor. Cardio: Heart sounds are normal with regular rate and rhythm and no murmurs, rubs or gallops. Peripheral pulses are normal and equal bilaterally without edema. No aortic or femoral bruits. Chest: symmetric with normal excursions and percussion. Breasts: Symmetric, without  lumps, nipple discharge, retractions, or fibrocystic changes.  Abdomen: Flat, soft, with bowl sounds. Nontender, no guarding, rebound, hernias, masses, or organomegaly.  Lymphatics: Non tender without lymphadenopathy.  Musculoskeletal: Full ROM all peripheral extremities, joint stability, 5/5 strength and normal gait. Generalized decrease in muscle power, tone & bulk.  Skin: Warm and dry without rashes, lesions, cyanosis, clubbing or  ecchymosis.  Neuro: Cranial nerves intact. DTR's 1+ in UE's and absent in LE's.  No cerebellar symptoms. Sensation decreased to touch, Vibratory & Monofilament at the mid shin level bilaterally.   Pysch: Alert and oriented X 3, normal affect, Insight and Judgment appropriate.   Assessment and Plan  1.  Annual Preventative Screening Examination   2. Essential hypertension  - EKG 12-Lead - TSH  3. Mixed hyperlipidemia  - Lipid panel - TSH  4. Controlled type 1 diabetes mellitus with chronic kidney disease, unspecified CKD stage (HCC)  - Microalbumin / creatinine urine ratio - Hemoglobin A1c  5. Vitamin D deficiency  - VITAMIN D 25 Hydroxy   6. Type 1 diabetes mellitus with diabetic neuropathy (HCC)  - HM DIABETES FOOT EXAM - LOW EXTREMITY NEUR EXAM DOCUM  7. Screening for rectal cancer  - POC Hemoccult Bld/Stl   8. At low risk for fall   9. Depression screen   10. Medication management  - Urinalysis, Routine w reflex microscopic  - CBC with Differential/Platelet - BASIC METABOLIC PANEL WITH GFR - Hepatic function panel - Magnesium   Continue prudent diet as discussed, weight control, BP monitoring, regular exercise, and medications. Discussed med's effects and SE's. Screening labs and tests as requested with regular follow-up as recommended. Over 40 minutes of exam, counseling, chart review and high complex critical decision making was performed.

## 2015-04-16 ENCOUNTER — Other Ambulatory Visit: Payer: Self-pay | Admitting: *Deleted

## 2015-04-16 ENCOUNTER — Other Ambulatory Visit: Payer: Self-pay | Admitting: Internal Medicine

## 2015-04-16 LAB — URINALYSIS, ROUTINE W REFLEX MICROSCOPIC
Bilirubin Urine: NEGATIVE
Glucose, UA: NEGATIVE
Hgb urine dipstick: NEGATIVE
Ketones, ur: NEGATIVE
Nitrite: NEGATIVE
Protein, ur: NEGATIVE
Specific Gravity, Urine: 1.018 (ref 1.001–1.035)
pH: 6.5 (ref 5.0–8.0)

## 2015-04-16 LAB — BASIC METABOLIC PANEL WITH GFR
BUN: 27 mg/dL — ABNORMAL HIGH (ref 7–25)
CO2: 25 mmol/L (ref 20–31)
Calcium: 9.9 mg/dL (ref 8.6–10.4)
Chloride: 103 mmol/L (ref 98–110)
Creat: 1.46 mg/dL — ABNORMAL HIGH (ref 0.60–0.88)
GFR, Est African American: 38 mL/min — ABNORMAL LOW (ref >=60)
GFR, Est Non African American: 33 mL/min — ABNORMAL LOW (ref >=60)
Glucose, Bld: 228 mg/dL — ABNORMAL HIGH (ref 65–99)
Potassium: 4.6 mmol/L (ref 3.5–5.3)
Sodium: 141 mmol/L (ref 135–146)

## 2015-04-16 LAB — HEPATIC FUNCTION PANEL
ALT: 14 U/L (ref 6–29)
AST: 19 U/L (ref 10–35)
Albumin: 4.3 g/dL (ref 3.6–5.1)
Alkaline Phosphatase: 52 U/L (ref 33–130)
Bilirubin, Direct: 0.1 mg/dL (ref <=0.2)
Indirect Bilirubin: 0.2 mg/dL (ref 0.2–1.2)
Total Bilirubin: 0.3 mg/dL (ref 0.2–1.2)
Total Protein: 6.8 g/dL (ref 6.1–8.1)

## 2015-04-16 LAB — URINALYSIS, MICROSCOPIC ONLY
Casts: NONE SEEN [LPF]
Crystals: NONE SEEN [HPF]
Squamous Epithelial / LPF: NONE SEEN [HPF] (ref <=5)
Yeast: NONE SEEN [HPF]

## 2015-04-16 LAB — MAGNESIUM: Magnesium: 1.9 mg/dL (ref 1.5–2.5)

## 2015-04-16 LAB — LIPID PANEL
Cholesterol: 160 mg/dL (ref 125–200)
HDL: 54 mg/dL (ref >=46)
LDL Cholesterol: 64 mg/dL (ref <130)
Total CHOL/HDL Ratio: 3 Ratio (ref <=5.0)
Triglycerides: 208 mg/dL — ABNORMAL HIGH (ref <150)
VLDL: 42 mg/dL — ABNORMAL HIGH (ref <30)

## 2015-04-16 LAB — MICROALBUMIN / CREATININE URINE RATIO
Creatinine, Urine: 148 mg/dL (ref 20–320)
Microalb Creat Ratio: 9 mcg/mg creat (ref <30)
Microalb, Ur: 1.3 mg/dL

## 2015-04-16 LAB — VITAMIN D 25 HYDROXY (VIT D DEFICIENCY, FRACTURES): Vit D, 25-Hydroxy: 36 ng/mL (ref 30–100)

## 2015-04-16 LAB — HEMOGLOBIN A1C
Hgb A1c MFr Bld: 7.5 % — ABNORMAL HIGH (ref <5.7)
Mean Plasma Glucose: 169 mg/dL — ABNORMAL HIGH (ref <117)

## 2015-04-16 LAB — TSH: TSH: 0.881 u[IU]/mL (ref 0.350–4.500)

## 2015-04-16 MED ORDER — INSULIN GLARGINE 100 UNIT/ML ~~LOC~~ SOLN: SUBCUTANEOUS | Status: DC

## 2015-04-17 ENCOUNTER — Other Ambulatory Visit: Payer: Self-pay | Admitting: Internal Medicine

## 2015-04-18 ENCOUNTER — Other Ambulatory Visit: Payer: Self-pay | Admitting: Internal Medicine

## 2015-04-18 DIAGNOSIS — N390 Urinary tract infection, site not specified: Secondary | ICD-10-CM

## 2015-04-18 LAB — URINE CULTURE: Colony Count: >=100000

## 2015-04-18 MED ORDER — CIPROFLOXACIN HCL 250 MG PO TABS: 250.0000 mg | ORAL_TABLET | Freq: Two times a day (BID) | ORAL | Status: AC

## 2015-04-29 DIAGNOSIS — H353211 Exudative age-related macular degeneration, right eye, with active choroidal neovascularization: Secondary | ICD-10-CM | POA: Diagnosis not present

## 2015-05-04 ENCOUNTER — Other Ambulatory Visit: Payer: Self-pay | Admitting: *Deleted

## 2015-05-04 DIAGNOSIS — Z1212 Encounter for screening for malignant neoplasm of rectum: Secondary | ICD-10-CM

## 2015-05-04 LAB — POC HEMOCCULT BLD/STL (HOME/3-CARD/SCREEN)
Card #2 Fecal Occult Blod, POC: NEGATIVE
Card #3 Fecal Occult Blood, POC: NEGATIVE
Fecal Occult Blood, POC: NEGATIVE

## 2015-05-06 DIAGNOSIS — H353133 Nonexudative age-related macular degeneration, bilateral, advanced atrophic without subfoveal involvement: Secondary | ICD-10-CM | POA: Diagnosis not present

## 2015-05-06 DIAGNOSIS — H353221 Exudative age-related macular degeneration, left eye, with active choroidal neovascularization: Secondary | ICD-10-CM | POA: Diagnosis not present

## 2015-05-06 DIAGNOSIS — H353211 Exudative age-related macular degeneration, right eye, with active choroidal neovascularization: Secondary | ICD-10-CM | POA: Diagnosis not present

## 2015-05-07 DIAGNOSIS — M461 Sacroiliitis, not elsewhere classified: Secondary | ICD-10-CM | POA: Diagnosis not present

## 2015-05-07 DIAGNOSIS — M5020 Other cervical disc displacement, unspecified cervical region: Secondary | ICD-10-CM | POA: Diagnosis not present

## 2015-05-07 DIAGNOSIS — M47816 Spondylosis without myelopathy or radiculopathy, lumbar region: Secondary | ICD-10-CM | POA: Diagnosis not present

## 2015-05-11 ENCOUNTER — Other Ambulatory Visit: Payer: Self-pay | Admitting: *Deleted

## 2015-05-11 MED ORDER — AMMONIUM LACTATE 12 % EX LOTN: 1.0000 "application " | TOPICAL_LOTION | CUTANEOUS | Status: DC | PRN

## 2015-05-18 ENCOUNTER — Encounter: Payer: Self-pay | Admitting: Physician Assistant

## 2015-05-18 ENCOUNTER — Ambulatory Visit (INDEPENDENT_AMBULATORY_CARE_PROVIDER_SITE_OTHER): Payer: Medicare Other | Admitting: Physician Assistant

## 2015-05-18 VITALS — BP 130/68 | HR 57 | Temp 97.3°F | Resp 14 | Ht 64.0 in | Wt 138.4 lb

## 2015-05-18 DIAGNOSIS — Z0001 Encounter for general adult medical examination with abnormal findings: Secondary | ICD-10-CM

## 2015-05-18 DIAGNOSIS — N183 Chronic kidney disease, stage 3 (moderate): Secondary | ICD-10-CM | POA: Diagnosis not present

## 2015-05-18 DIAGNOSIS — E782 Mixed hyperlipidemia: Secondary | ICD-10-CM

## 2015-05-18 DIAGNOSIS — E1140 Type 2 diabetes mellitus with diabetic neuropathy, unspecified: Secondary | ICD-10-CM

## 2015-05-18 DIAGNOSIS — I10 Essential (primary) hypertension: Secondary | ICD-10-CM

## 2015-05-18 DIAGNOSIS — G8929 Other chronic pain: Secondary | ICD-10-CM

## 2015-05-18 DIAGNOSIS — E1365 Other specified diabetes mellitus with hyperglycemia: Secondary | ICD-10-CM

## 2015-05-18 DIAGNOSIS — Z794 Long term (current) use of insulin: Secondary | ICD-10-CM

## 2015-05-18 DIAGNOSIS — L309 Dermatitis, unspecified: Secondary | ICD-10-CM

## 2015-05-18 DIAGNOSIS — E559 Vitamin D deficiency, unspecified: Secondary | ICD-10-CM

## 2015-05-18 DIAGNOSIS — E1322 Other specified diabetes mellitus with diabetic chronic kidney disease: Secondary | ICD-10-CM

## 2015-05-18 DIAGNOSIS — E11319 Type 2 diabetes mellitus with unspecified diabetic retinopathy without macular edema: Secondary | ICD-10-CM | POA: Insufficient documentation

## 2015-05-18 DIAGNOSIS — R6889 Other general symptoms and signs: Secondary | ICD-10-CM | POA: Diagnosis not present

## 2015-05-18 DIAGNOSIS — IMO0002 Reserved for concepts with insufficient information to code with codable children: Secondary | ICD-10-CM

## 2015-05-18 DIAGNOSIS — Z79899 Other long term (current) drug therapy: Secondary | ICD-10-CM | POA: Diagnosis not present

## 2015-05-18 DIAGNOSIS — Z0000 Encounter for general adult medical examination without abnormal findings: Secondary | ICD-10-CM

## 2015-05-18 MED ORDER — PREDNISONE 20 MG PO TABS: ORAL_TABLET | ORAL | Status: DC

## 2015-05-18 MED ORDER — TRIAMCINOLONE ACETONIDE 0.1 % EX CREA: 1.0000 "application " | TOPICAL_CREAM | Freq: Every day | CUTANEOUS | Status: DC

## 2015-05-18 NOTE — Progress Notes (Signed)
Patient ID: [name], female   DOB: [birth year], 82 y.o.   MRN: [id number]  MEDICARE ANNUAL WELLNESS VISIT AND OV  Assessment:   1. Essential hypertension - continue medications, DASH diet, exercise and monitor at home. Call if greater than 130/80.   2. Mixed hyperlipidemia -continue medications, check lipids, decrease fatty foods, increase activity.   3. Uncontrolled secondary diabetes mellitus with stage 3 CKD (GFR 30-59) (HCC) Discussed general issues about diabetes pathophysiology and management., Educational material distributed., Suggested low cholesterol diet., Encouraged aerobic exercise., Discussed foot care., Reminded to get yearly retinal exam.  4. Type 2 diabetes mellitus with diabetic neuropathy, with long-term current use of insulin ([place]) Discussed general issues about diabetes pathophysiology and management., Educational material distributed., Suggested low cholesterol diet., Encouraged aerobic exercise., Discussed foot care., Reminded to get yearly retinal exam.  5. Vitamin D deficiency  6. Medication management  7. Chronic pain Continue medications  8. Dermatitis - predniSONE (DELTASONE) 20 MG tablet; 2 tablets daily for 3 days, 1 tablet daily for 4 days.  Dispense: 10 tablet; Refill: 0 - triamcinolone cream (KENALOG) 0.1 %; Apply 1 application topically daily.  Dispense: 453.6 g; Refill: 1  9. Diabetic retinopathy associated with type 2 diabetes mellitus, macular edema presence unspecified, unspecified retinopathy severity ([place]) Continue follow up eye doctor   Plan:   During the course of the visit the patient was educated and counseled about appropriate screening and preventive services including:    Pneumococcal vaccine   Influenza vaccine  Td vaccine  Screening electrocardiogram  Bone densitometry screening  Colorectal cancer screening  Diabetes screening  Glaucoma screening  Nutrition counseling   Advanced directives:  requested  Conditions/risks identified: BMI: Discussed weight loss, diet, and increase physical activity.  Increase physical activity: AHA recommends 150 minutes of physical activity a week.  Medications reviewed Diabetes is not at goal, ACE/ARB therapy: Yes. Urinary Incontinence is not an issue: discussed non pharmacology and pharmacology options.  Fall risk: low- discussed PT, home fall assessment, medications.   Subjective:    [name]  presents for [employer] Visit and OV.  Date of last medicare wellness visit was 08/2014. This very nice 82 y.o. MWF also presents for rash and medicare wellness.   She states that for 3-4 days she has had itching from under her bra to up to her neck. She has had shingles years ago and states it feels similar. No pain, fever, chills. Has had new Aveeno body wash and new lotion, nothing else new.  Lab Results  Component Value Date   ALT 14 04/15/2015   AST 19 04/15/2015   ALKPHOS 52 04/15/2015   BILITOT 0.3 04/15/2015    Patient is treated for HTN since 2000 & BP has been controlled at home. BP: 130/68 mmHg  Patient had a negative Cardiolite in 2009. Patient has had no complaints of any cardiac type chest pain, palpitations, dyspnea/orthopnea/PND, dizziness, claudication, or dependent edema.   Hyperlipidemia is controlled with diet & meds. Patient denies myalgias or other med SE's. Last Lipids were at goal  Lab Results  Component Value Date   CHOL 160 04/15/2015   HDL 54 04/15/2015   LDLCALC 64 04/15/2015   TRIG 208* 04/15/2015   CHOLHDL 3.0 04/15/2015    Also, the patient has history of T1_IDDM since 1996 and has been on insulin since 2000 and she has complications w/ CKD, retinopathy and Neuropathy. She has had no symptoms of reactive hypoglycemia, diabetic polys or visual blurring.  She does report tingling and occasional painful paresthesias of the feet.  Lab Results  Component Value Date   HGBA1C 7.5* 04/15/2015   Lab  Results  Component Value Date   GFRNONAA 33* 04/15/2015    Further, the patient also has history of Vitamin D Deficiency and supplements vitamin D without any suspected side-effects.  Lab Results  Component Value Date   VD25OH 36 04/15/2015    Names of Other Physician/Practitioners you currently use: 1. [facility] here for primary care 2. [facility], eye doctor, last visit in May 06 2015, goes back Feb 16th.  3. Dr Sissy Hoff, DDS, dentist, last visit Nov 2016  Patient Care Team: [name], MD as PCP - General (Internal Medicine) Warden Fillers, MD as Consulting Physician (Optometry) [name], MD as Consulting Physician (Ophthalmology) Glenna Fellows, MD as Attending Physician (Neurosurgery) Melrose Nakayama, MD as Consulting Physician (Orthopedic Surgery) Milus Banister, MD as Attending Physician (Gastroenterology)    Medication List       This list is accurate as of: 05/18/15  3:01 PM.  Always use your most recent med list.               ACCU-CHEK AVIVA PLUS test strip  Generic drug:  glucose blood  Check blood sugar 3 times daily-DX-E11.22     ACCU-CHEK AVIVA PLUS w/Device Kit     ACCU-CHEK AVIVA Soln  Check blood sugar 3 times daily-DX-E11.22     ammonium lactate 12 % lotion  Commonly known as:  LAC-HYDRIN  Apply 1 application topically as needed for dry skin.     aspirin 81 MG tablet  Take 81 mg by mouth daily.     ASSURE COMFORT LANCETS 30G Misc  Check blood sugar 3 times daily-DX-E11.22     bisoprolol-hydrochlorothiazide 5-6.25 MG tablet  Commonly known as:  ZIAC  Take 1 tablet by mouth daily. For BP     fenofibrate micronized 134 MG capsule  Commonly known as:  LOFIBRA  TAKE ONE CAPSULE BY MOUTH DAILY FOR BLOOD FATS     fluticasone 50 MCG/ACT nasal spray  Commonly known as:  FLONASE  Place 2 sprays into both nostrils daily.     gabapentin 300 MG capsule  Commonly known as:  NEURONTIN   TAKE 1 CAPSULE BY MOUTH THREE TIMES DAILY TO FOUR TIMES DAILY FOR DIABETIC NEUROPATHY PAIN     gabapentin 600 MG tablet  Commonly known as:  NEURONTIN  Take 1 tablet by mouth 4  times daily for pain     HAIR/SKIN/NAILS Tabs  Take by mouth 2 (two) times daily.     Insulin Isophane & Regular Human (70-30) 100 UNIT/ML PEN  Commonly known as:  HUMULIN 70/30 PEN  Inject 5-10 units [place] tid  with meals,  on a sliding scale.  DX-11.22     LANTUS SOLOSTAR 100 UNIT/ML Solostar Pen  Generic drug:  Insulin Glargine  INJECT 30 UNITS INTO THE SKIN AT BEDTIME. OR AS DIRECTED TO CONTROL BLOOD SUGARS     losartan 100 MG tablet  Commonly known as:  COZAAR  TAKE 1 TABLET BY MOUTH DAILY FOR HIGH BLOOD PRESSURE, HEART, AND KIDNEYS     Magnesium 500 MG Caps  Take 500 mg by mouth 2 (two) times daily.     meloxicam 15 MG tablet  Commonly known as:  MOBIC  1/2 to 1 tablet daily with food for pain and inflammation     metFORMIN 1000  MG tablet  Commonly known as:  GLUCOPHAGE  TAKE ONE TABLET TWO TIMES A DAY AFTER MEALS FOR DIABETES     pravastatin 40 MG tablet  Commonly known as:  PRAVACHOL  TAKE 1 TABLET BY MOUTH AT BEDTIME FOR CHOLESTEROL     rOPINIRole 3 MG tablet  Commonly known as:  REQUIP  TAKE 1 TABLET BY MOUTH TWICE DAILY AND TAKE TWO TABLETS AT BEDTIME FOR RESTLESS  LEGS     traMADol 50 MG tablet  Commonly known as:  ULTRAM  Take 1/2 to 1 tablet 4 x day or every 4 hours if needed for severe pain     Vitamin D3 2000 units Tabs  Take 1 capsule by mouth 2 (two) times daily.      Screening Tests  Immunization History  Administered Date(s) Administered  . DT 01/04/2011  . Influenza Split 01/23/2013  . Influenza, High Dose Seasonal PF 02/27/2014, 03/11/2015  . Pneumococcal Conjugate-13 02/27/2014  . Pneumococcal Polysaccharide-23 01/06/2012   Preventative care: Last colonoscopy: 2006 [facility] 02/2015 DEXA 2007 Echo 2014  Tetanus 2012 Flu 2016 Pneumo 2013 Prevnar 13  2015 Shingles vaccine declines  Risk Factors: Tobacco Social History  Substance Use Topics  . Smoking status: Never Smoker   . Smokeless tobacco: Never Used  . Alcohol Use: No   She does not smoke.  Patient is not a former smoker. Are there smokers in your home (other than you)?  No Alcohol Current alcohol use: none  Caffeine Current caffeine use: coffee 1-3 cups /day, tea 0-2 /day and caffeinated soft drinks 0-2 /day  Exercise Current exercise: housecleaning, walking and yard work  Nutrition/Diet Current diet: in general, a "healthy" diet    Cardiac risk factors: advanced age (older than 52 for men, 28 for women), diabetes mellitus, dyslipidemia, hypertension, obesity (BMI >= 30 kg/m2) and sedentary lifestyle.  Depression Screen (Note: if answer to either of the following is "Yes", a more complete depression screening is indicated)   Q1: Over the past two weeks, have you felt down, depressed or hopeless? No  Q2: Over the past two weeks, have you felt little interest or pleasure in doing things? No  Have you lost interest or pleasure in daily life? No  Do you often feel hopeless? No  Do you cry easily over simple problems? No  Activities of Daily Living In your present state of health, do you have any difficulty performing the following activities?:  Driving? No Managing money?  No Feeding yourself? No Getting from bed to chair? No Climbing a flight of stairs? No Preparing food and eating?: No Bathing or showering? No Getting dressed: No Getting to the toilet? No Using the toilet:No Moving around from place to place: No In the past year have you fallen or had a near fall? Yes, 1 fall, no injury  Vision Difficulties: No  Hearing Difficulties: No Do you often ask people to speak up or repeat themselves? No Do you experience ringing or noises in your ears? No Do you have difficulty understanding soft or whispered voices? Sometimes.  Cognition  Do you feel that  you have a problem with memory?No  Do you often misplace items? No  Do you feel safe at home?  Yes  Advanced directives Does patient have a [place]? Yes Does patient have a Living Will? Yes  Past Medical History  Diagnosis Date  . Liver lesion   . Chronic back pain   . Neuropathy ([place])   . Diabetic  neuropathy ([place])   . Wears glasses   . Type II or unspecified type diabetes mellitus without mention of complication, not stated as uncontrolled   . Hypercholesteremia   . Hypertension   . Arthritis   . Vitamin D deficiency    Past Surgical History  Procedure Laterality Date  . Abdominal hysterectomy    . Cesarean section      x 3   . Back surgery  1993    lumb lam-fusion  . Appendectomy    . Colonoscopy    . Eye surgery      both cataracts  . Trigger finger release  01/10/2012    Procedure: RELEASE TRIGGER FINGER/A-1 PULLEY;  Surgeon: Hessie Dibble, MD;  Location: [facility];  Service: Orthopedics;  Laterality: Right;  right ring trigger release    Objective:     Blood pressure 130/68, pulse 57, temperature 97.3 F (36.3 C), temperature source Temporal, resp. rate 14, height 5' 4"  (1.626 m), weight 138 lb 6.4 oz (62.778 kg), SpO2 95 %.   General Appearance: Well nourished, alert, WD/WN, female and in no apparent distress. Eyes: PERRLA, EOMs, conjunctiva no swelling or erythema, normal fundi and vessels. Sinuses: No frontal/maxillary tenderness ENT/Mouth: EACs patent / TMs  nl. Nares clear without erythema, swelling, mucoid exudates. Oral hygiene is good. No erythema, swelling, or exudate. Tongue normal, non-obstructing. Tonsils not swollen or erythematous. Hearing normal.  Neck: Supple, thyroid normal. No bruits, nodes or JVD. Respiratory: Respiratory effort normal.  BS equal and clear bilateral without rales, rhonci, wheezing or stridor. Cardio: Heart sounds are normal with regular rate and rhythm and no murmurs, rubs or gallops.  Peripheral pulses are normal and equal bilaterally without edema. No aortic or femoral bruits. Chest: symmetric with normal excursions and percussion. Abdomen: Flat, soft  with nl bowel sounds. Nontender, no guarding, rebound, hernias, masses, or organomegaly.  Lymphatics: Non tender without lymphadenopathy.  Musculoskeletal: Full ROM all peripheral extremities, joint stability, 5/5 strength, and normal gait. Skin: right flank with dry skin, excoriations, fine papules, no vesicles.  Neuro: Cranial nerves intact. DTR's UE  Nl/Equal and DTR's LE's absent.  Normal muscle tone, no cerebellar symptoms. Sensation decreased bilateral feet to shins.  Pysch: Alert and oriented X 3, normal affect, Insight and Judgment appropriate.    Medicare Attestation I have personally reviewed: The patient's medical and social history Their use of alcohol, tobacco or illicit drugs Their current medications and supplements The patient's functional ability including ADLs,fall risks, home safety risks, cognitive, and hearing and visual impairment Diet and physical activities Evidence for depression or mood disorders  The patient's weight, height, BMI, and visual acuity have been recorded in the chart.  I have made referrals, counseling, and provided education to the patient based on review of the above and I have provided the patient with a written personalized care plan for preventive services.  Over 40 minutes of exam, counseling, chart review was performed.  Vicie Mutters, PA-C   05/18/2015

## 2015-05-18 NOTE — Patient Instructions (Addendum)
Get on zyrtec Decrease vitamin d to 5000 a day Get dove soap to use Stop the new lotion   Contact Dermatitis Dermatitis is redness, soreness, and swelling (inflammation) of the skin. Contact dermatitis is a reaction to certain substances that touch the skin. There are two types of contact dermatitis:   Irritant contact dermatitis. This type is caused by something that irritates your skin, such as dry hands from washing them too much. This type does not require previous exposure to the substance for a reaction to occur. This type is more common.  Allergic contact dermatitis. This type is caused by a substance that you are allergic to, such as a nickel allergy or poison ivy. This type only occurs if you have been exposed to the substance (allergen) before. Upon a repeat exposure, your body reacts to the substance. This type is less common. CAUSES  Many different substances can cause contact dermatitis. Irritant contact dermatitis is most commonly caused by exposure to:   Makeup.   Soaps.   Detergents.   Bleaches.   Acids.   Metal salts, such as nickel.  Allergic contact dermatitis is most commonly caused by exposure to:   Poisonous plants.   Chemicals.   Jewelry.   Latex.   Medicines.   Preservatives in products, such as clothing.  RISK FACTORS This condition is more likely to develop in:   People who have jobs that expose them to irritants or allergens.  People who have certain medical conditions, such as asthma or eczema.  SYMPTOMS  Symptoms of this condition may occur anywhere on your body where the irritant has touched you or is touched by you. Symptoms include:  Dryness or flaking.   Redness.   Cracks.   Itching.   Pain or a burning feeling.   Blisters.  Drainage of small amounts of blood or clear fluid from skin cracks. With allergic contact dermatitis, there may also be swelling in areas such as the eyelids, mouth, or genitals.    DIAGNOSIS  This condition is diagnosed with a medical history and physical exam. A patch skin test may be performed to help determine the cause. If the condition is related to your job, you may need to see an occupational medicine specialist. TREATMENT Treatment for this condition includes figuring out what caused the reaction and protecting your skin from further contact. Treatment may also include:   Steroid creams or ointments. Oral steroid medicines may be needed in more severe cases.  Antibiotics or antibacterial ointments, if a skin infection is present.  Antihistamine lotion or an antihistamine taken by mouth to ease itching.  A bandage (dressing). HOME CARE INSTRUCTIONS Skin Care  Moisturize your skin as needed.   Apply cool compresses to the affected areas.  Try taking a bath with:  Epsom salts. Follow the instructions on the packaging. You can get these at your local pharmacy or grocery store.  Baking soda. Pour a small amount into the bath as directed by your health care provider.  Colloidal oatmeal. Follow the instructions on the packaging. You can get this at your local pharmacy or grocery store.  Try applying baking soda paste to your skin. Stir water into baking soda until it reaches a paste-like consistency.  Do not scratch your skin.  Bathe less frequently, such as every other day.  Bathe in lukewarm water. Avoid using hot water. Medicines  Take or apply over-the-counter and prescription medicines only as told by your health care provider.   If you  were prescribed an antibiotic medicine, take or apply your antibiotic as told by your health care provider. Do not stop using the antibiotic even if your condition starts to improve. General Instructions  Keep all follow-up visits as told by your health care provider. This is important.  Avoid the substance that caused your reaction. If you do not know what caused it, keep a journal to try to track what  caused it. Write down:  What you eat.  What cosmetic products you use.  What you drink.  What you wear in the affected area. This includes jewelry.  If you were given a dressing, take care of it as told by your health care provider. This includes when to change and remove it. SEEK MEDICAL CARE IF:   Your condition does not improve with treatment.  Your condition gets worse.  You have signs of infection such as swelling, tenderness, redness, soreness, or warmth in the affected area.  You have a fever.  You have new symptoms. SEEK IMMEDIATE MEDICAL CARE IF:   You have a severe headache, neck pain, or neck stiffness.  You vomit.  You feel very sleepy.  You notice red streaks coming from the affected area.  Your bone or joint underneath the affected area becomes painful after the skin has healed.  The affected area turns darker.  You have difficulty breathing.   This information is not intended to replace advice given to you by your health care provider. Make sure you discuss any questions you have with your health care provider.   Document Released: 04/08/2000 Document Revised: 12/31/2014 Document Reviewed: 08/27/2014 Elsevier Interactive Patient Education [employer].   Diabetes is a very complicated disease...lets simplify it.  An easy way to look at it to understand the complications is if you think of the extra sugar floating in your blood stream as glass shards floating through your blood stream.    Diabetes affects your small vessels first: 1) The glass shards (sugar) scraps down the tiny blood vessels in your eyes and lead to diabetic retinopathy, the leading cause of blindness in the [place]. Diabetes is the leading cause of newly diagnosed adult (21 to 80 years of age) blindness in the [place].  2) The glass shards scratches down the tiny vessels of your legs leading to nerve damage called neuropathy and can lead to amputations of your feet. More than  60% of all non-traumatic amputations of lower limbs occur in people with diabetes.  3) Over time the small vessels in your brain are shredded and closed off, individually this does not cause any problems but over a long period of time many of the small vessels being blocked can lead to Vascular Dementia.   4) Your kidney's are a filter system and have a "net" that keeps certain things in the body and lets bad things out. Sugar shreds this net and leads to kidney damage and eventually failure. Decreasing the sugar that is destroying the net and certain blood pressure medications can help stop or decrease progression of kidney disease. Diabetes was the primary cause of kidney failure in 44 percent of all new cases in 2011.  5) Diabetes also destroys the small vessels in your penis that lead to erectile dysfunction. Eventually the vessels are so damaged that you may not be responsive to cialis or viagra.   Diabetes and your large vessels: Your larger vessels consist of your coronary arteries in your heart and the carotid vessels to your brain.  Diabetes or even increased sugars put you at 300% increased risk of heart attack and stroke and this is why.. The sugar scrapes down your large blood vessels and your body sees this as an internal injury and tries to repair itself. Just like you get a scab on your skin, your platelets will stick to the blood vessel wall trying to heal it. This is why we have diabetics on low dose aspirin daily, this prevents the platelets from sticking and can prevent plaque formation. In addition, your body takes cholesterol and tries to shove it into the open wound. This is why we want your LDL, or bad cholesterol, below 70.   The combination of platelets and cholesterol over 5-10 years forms plaque that can break off and cause a heart attack or stroke.   PLEASE REMEMBER:  Diabetes is preventable! Up to 27 percent of complications and morbidities among individuals with type 2  diabetes can be prevented, delayed, or effectively treated and minimized with regular visits to a health professional, appropriate monitoring and medication, and a healthy diet and lifestyle.

## 2015-05-25 ENCOUNTER — Ambulatory Visit (INDEPENDENT_AMBULATORY_CARE_PROVIDER_SITE_OTHER): Payer: Medicare Other | Admitting: *Deleted

## 2015-05-25 DIAGNOSIS — N390 Urinary tract infection, site not specified: Secondary | ICD-10-CM | POA: Diagnosis not present

## 2015-05-25 LAB — URINALYSIS, ROUTINE W REFLEX MICROSCOPIC
Bilirubin Urine: NEGATIVE
Glucose, UA: NEGATIVE
Hgb urine dipstick: NEGATIVE
Ketones, ur: NEGATIVE
Leukocytes, UA: NEGATIVE
Nitrite: NEGATIVE
Protein, ur: NEGATIVE
Specific Gravity, Urine: 1.019 (ref 1.001–1.035)
pH: 5 (ref 5.0–8.0)

## 2015-05-25 NOTE — Progress Notes (Signed)
Patient ID: [name], female   DOB: Jun 16, 1929, 82 y.o.   MRN: [id number] Patient presents for 1 month recheck UA, C&S.  Patient still has c/o abd pain, but denies any UTI symptoms.

## 2015-05-27 LAB — URINE CULTURE
Colony Count: NO GROWTH
Organism ID, Bacteria: NO GROWTH

## 2015-06-11 ENCOUNTER — Encounter: Payer: Self-pay | Admitting: Internal Medicine

## 2015-06-11 ENCOUNTER — Ambulatory Visit (INDEPENDENT_AMBULATORY_CARE_PROVIDER_SITE_OTHER): Payer: Medicare Other | Admitting: Internal Medicine

## 2015-06-11 VITALS — BP 138/56 | HR 58 | Temp 98.0°F | Resp 16 | Ht 64.0 in | Wt 130.0 lb

## 2015-06-11 DIAGNOSIS — H35352 Cystoid macular degeneration, left eye: Secondary | ICD-10-CM | POA: Diagnosis not present

## 2015-06-11 DIAGNOSIS — J069 Acute upper respiratory infection, unspecified: Secondary | ICD-10-CM

## 2015-06-11 DIAGNOSIS — H353221 Exudative age-related macular degeneration, left eye, with active choroidal neovascularization: Secondary | ICD-10-CM | POA: Diagnosis not present

## 2015-06-11 DIAGNOSIS — H353211 Exudative age-related macular degeneration, right eye, with active choroidal neovascularization: Secondary | ICD-10-CM | POA: Diagnosis not present

## 2015-06-11 DIAGNOSIS — H353133 Nonexudative age-related macular degeneration, bilateral, advanced atrophic without subfoveal involvement: Secondary | ICD-10-CM | POA: Diagnosis not present

## 2015-06-11 MED ORDER — PROMETHAZINE-DM 6.25-15 MG/5ML PO SYRP: ORAL_SOLUTION | ORAL | Status: DC

## 2015-06-11 MED ORDER — AZELASTINE HCL 0.1 % NA SOLN: 2.0000 | Freq: Two times a day (BID) | NASAL | Status: DC

## 2015-06-11 MED ORDER — CETIRIZINE HCL 10 MG PO CAPS: 10.0000 mg | ORAL_CAPSULE | Freq: Every day | ORAL | Status: DC

## 2015-06-11 MED ORDER — DEXAMETHASONE SODIUM PHOSPHATE 100 MG/10ML IJ SOLN
10.0000 mg | Freq: Once | INTRAMUSCULAR | Status: AC
  Administered 2015-06-11: 10 mg via INTRAMUSCULAR

## 2015-06-11 NOTE — Progress Notes (Signed)
Subjective:    Patient ID: [name], female    DOB: Jan 16, 1930, 82 y.o.   MRN: [id number]  HPI  Patient presents to the office for evaluation of fatigue and headaches.  She reports that she just hasn't been feeling good.  She reports that it was very cold when she was at the beach 4 days ago and she has been very tired and feeling cold since then.  She reports that she did have very low at home this morning at 72, but generally on average she tends to run around 100.  She does use sliding scale insulin. She did use 22 units of insulin last night.  She did feel better after she at this morning.   Last night she ate cornflakes for dinner.  She is having some clear watery nasal dishcarge and frontal sinus pressure.  She does not have sinus infections regularly and does not tend to get allergies often.     Review of Systems  Constitutional: Positive for chills and fatigue. Negative for fever.  HENT: Positive for congestion and sinus pressure. Negative for ear pain, hearing loss, nosebleeds, postnasal drip, rhinorrhea, sore throat and trouble swallowing.   Eyes: Positive for visual disturbance (chronic macular degeneration). Negative for photophobia, pain, discharge, redness and itching.  Respiratory: Positive for cough. Negative for choking, chest tightness, shortness of breath and wheezing.   Cardiovascular: Negative for chest pain and palpitations.  Gastrointestinal: Negative for nausea and vomiting.  Neurological: Positive for dizziness (off kilter sensation) and headaches. Negative for weakness and light-headedness.       Objective:   Physical Exam  Constitutional: She is oriented to person, place, and time. She appears well-developed and well-nourished. No distress.  HENT:  Head: Normocephalic.  Right Ear: A middle ear effusion is present.  Left Ear: A middle ear effusion is present.  Nose: Mucosal edema present. Right sinus exhibits frontal sinus tenderness. Left sinus exhibits  frontal sinus tenderness.  Mouth/Throat: Oropharynx is clear and moist. No oropharyngeal exudate.  Eyes: Conjunctivae are normal. No scleral icterus.  Neck: Normal range of motion. Neck supple. No JVD present. No thyromegaly present.  Cardiovascular: Normal rate, regular rhythm, normal heart sounds and intact distal pulses.  Exam reveals no gallop and no friction rub.   No murmur heard. Pulmonary/Chest: Effort normal and breath sounds normal. No respiratory distress. She has no wheezes. She has no rales. She exhibits no tenderness.  Musculoskeletal: Normal range of motion.  Lymphadenopathy:    She has no cervical adenopathy.  Neurological: She is alert and oriented to person, place, and time.  Skin: Skin is warm and dry. She is not diaphoretic.  Psychiatric: She has a normal mood and affect. Her behavior is normal. Judgment and thought content normal.  Nursing note and vitals reviewed.   Filed Vitals:   06/11/15 1015  BP: 138/56  Pulse: 58  Temp: 98 F (36.7 C)  Resp: 16          Assessment & Plan:    1. Acute URI -diabetes does not seem to be the issue -decadron - azelastine (ASTELIN) 0.1 % nasal spray; Place 2 sprays into both nostrils 2 (two) times daily. Use in each nostril as directed  Dispense: 30 mL; Refill: 2 - promethazine-dextromethorphan (PROMETHAZINE-DM) 6.25-15 MG/5ML syrup; Take 5-10 mL PO q8hrs prn for cough and nasal congestion.  Dispense: 360 mL; Refill: 1 - Cetirizine HCl 10 MG CAPS; Take 1 capsule (10 mg total) by mouth at bedtime.  Dispense:  30 capsule; Refill: 0

## 2015-06-11 NOTE — Patient Instructions (Addendum)
Please start taking cetirizine 1 tablet right before bedtime.    Please continue to use flonase night before bedtime.  Please use saline in your nose to rinse your sinuses out before bedtime.    Please use asteline nose spray 2 sprays per nostril once in the morning and once in the evenings.  Please take cough syrup up to 3 times daily as needed for coughing.    If you need to you can decrease your humalog to 20 units instead of 22 if you continue to have morning blood sugars less than 75.    If you do not improve in the next 5 days please call the office and we will call in an antibiotic.

## 2015-06-11 NOTE — Addendum Note (Signed)
Addended by: Desirea Mizrahi A on: 06/11/2015 11:11 AM   Modules accepted: Orders

## 2015-06-17 DIAGNOSIS — H353211 Exudative age-related macular degeneration, right eye, with active choroidal neovascularization: Secondary | ICD-10-CM | POA: Diagnosis not present

## 2015-06-20 ENCOUNTER — Other Ambulatory Visit: Payer: Self-pay | Admitting: Internal Medicine

## 2015-07-16 DIAGNOSIS — H353221 Exudative age-related macular degeneration, left eye, with active choroidal neovascularization: Secondary | ICD-10-CM | POA: Diagnosis not present

## 2015-07-17 ENCOUNTER — Other Ambulatory Visit: Payer: Self-pay | Admitting: Internal Medicine

## 2015-07-20 ENCOUNTER — Ambulatory Visit: Payer: Self-pay | Admitting: Internal Medicine

## 2015-08-04 ENCOUNTER — Other Ambulatory Visit: Payer: Self-pay | Admitting: Internal Medicine

## 2015-08-05 DIAGNOSIS — H353211 Exudative age-related macular degeneration, right eye, with active choroidal neovascularization: Secondary | ICD-10-CM | POA: Diagnosis not present

## 2015-08-06 ENCOUNTER — Other Ambulatory Visit: Payer: Self-pay | Admitting: Internal Medicine

## 2015-08-11 ENCOUNTER — Other Ambulatory Visit: Payer: Self-pay | Admitting: Internal Medicine

## 2015-08-12 DIAGNOSIS — M461 Sacroiliitis, not elsewhere classified: Secondary | ICD-10-CM | POA: Diagnosis not present

## 2015-08-12 DIAGNOSIS — M5020 Other cervical disc displacement, unspecified cervical region: Secondary | ICD-10-CM | POA: Diagnosis not present

## 2015-08-12 DIAGNOSIS — M47816 Spondylosis without myelopathy or radiculopathy, lumbar region: Secondary | ICD-10-CM | POA: Diagnosis not present

## 2015-08-27 DIAGNOSIS — H353211 Exudative age-related macular degeneration, right eye, with active choroidal neovascularization: Secondary | ICD-10-CM | POA: Diagnosis not present

## 2015-08-27 DIAGNOSIS — H353133 Nonexudative age-related macular degeneration, bilateral, advanced atrophic without subfoveal involvement: Secondary | ICD-10-CM | POA: Diagnosis not present

## 2015-08-27 DIAGNOSIS — H401121 Primary open-angle glaucoma, left eye, mild stage: Secondary | ICD-10-CM | POA: Diagnosis not present

## 2015-08-27 DIAGNOSIS — H353221 Exudative age-related macular degeneration, left eye, with active choroidal neovascularization: Secondary | ICD-10-CM | POA: Diagnosis not present

## 2015-08-28 ENCOUNTER — Other Ambulatory Visit: Payer: Self-pay | Admitting: Internal Medicine

## 2015-09-10 DIAGNOSIS — H353211 Exudative age-related macular degeneration, right eye, with active choroidal neovascularization: Secondary | ICD-10-CM | POA: Diagnosis not present

## 2015-09-10 DIAGNOSIS — H353221 Exudative age-related macular degeneration, left eye, with active choroidal neovascularization: Secondary | ICD-10-CM | POA: Diagnosis not present

## 2015-09-10 DIAGNOSIS — H353133 Nonexudative age-related macular degeneration, bilateral, advanced atrophic without subfoveal involvement: Secondary | ICD-10-CM | POA: Diagnosis not present

## 2015-09-10 DIAGNOSIS — H35352 Cystoid macular degeneration, left eye: Secondary | ICD-10-CM | POA: Diagnosis not present

## 2015-09-23 DIAGNOSIS — H353211 Exudative age-related macular degeneration, right eye, with active choroidal neovascularization: Secondary | ICD-10-CM | POA: Diagnosis not present

## 2015-10-12 DIAGNOSIS — Z794 Long term (current) use of insulin: Secondary | ICD-10-CM | POA: Diagnosis not present

## 2015-10-12 DIAGNOSIS — E1165 Type 2 diabetes mellitus with hyperglycemia: Secondary | ICD-10-CM | POA: Diagnosis not present

## 2015-10-20 DIAGNOSIS — H353231 Exudative age-related macular degeneration, bilateral, with active choroidal neovascularization: Secondary | ICD-10-CM | POA: Diagnosis not present

## 2015-10-20 DIAGNOSIS — H40012 Open angle with borderline findings, low risk, left eye: Secondary | ICD-10-CM | POA: Diagnosis not present

## 2015-10-20 DIAGNOSIS — E119 Type 2 diabetes mellitus without complications: Secondary | ICD-10-CM | POA: Diagnosis not present

## 2015-10-20 DIAGNOSIS — Z961 Presence of intraocular lens: Secondary | ICD-10-CM | POA: Diagnosis not present

## 2015-10-22 ENCOUNTER — Ambulatory Visit (INDEPENDENT_AMBULATORY_CARE_PROVIDER_SITE_OTHER): Payer: Medicare Other | Admitting: Internal Medicine

## 2015-10-22 ENCOUNTER — Encounter: Payer: Self-pay | Admitting: Internal Medicine

## 2015-10-22 VITALS — BP 124/60 | HR 60 | Temp 97.3°F | Resp 16 | Ht 64.0 in | Wt 130.4 lb

## 2015-10-22 DIAGNOSIS — Z79899 Other long term (current) drug therapy: Secondary | ICD-10-CM | POA: Diagnosis not present

## 2015-10-22 DIAGNOSIS — E1040 Type 1 diabetes mellitus with diabetic neuropathy, unspecified: Secondary | ICD-10-CM | POA: Diagnosis not present

## 2015-10-22 DIAGNOSIS — IMO0002 Reserved for concepts with insufficient information to code with codable children: Secondary | ICD-10-CM

## 2015-10-22 DIAGNOSIS — I10 Essential (primary) hypertension: Secondary | ICD-10-CM | POA: Diagnosis not present

## 2015-10-22 DIAGNOSIS — E1322 Other specified diabetes mellitus with diabetic chronic kidney disease: Secondary | ICD-10-CM

## 2015-10-22 DIAGNOSIS — E1365 Other specified diabetes mellitus with hyperglycemia: Secondary | ICD-10-CM

## 2015-10-22 DIAGNOSIS — N183 Chronic kidney disease, stage 3 (moderate): Secondary | ICD-10-CM | POA: Diagnosis not present

## 2015-10-22 DIAGNOSIS — E782 Mixed hyperlipidemia: Secondary | ICD-10-CM | POA: Diagnosis not present

## 2015-10-22 DIAGNOSIS — E559 Vitamin D deficiency, unspecified: Secondary | ICD-10-CM | POA: Diagnosis not present

## 2015-10-22 LAB — CBC WITH DIFFERENTIAL/PLATELET
Basophils Absolute: 97 cells/uL (ref 0–200)
Basophils Relative: 1 %
Eosinophils Absolute: 388 cells/uL (ref 15–500)
Eosinophils Relative: 4 %
HCT: 36.8 % (ref 35.0–45.0)
Hemoglobin: 12.3 g/dL (ref 11.7–15.5)
Lymphocytes Relative: 33 %
Lymphs Abs: 3201 cells/uL (ref 850–3900)
MCH: 30.8 pg (ref 27.0–33.0)
MCHC: 33.4 g/dL (ref 32.0–36.0)
MCV: 92.2 fL (ref 80.0–100.0)
MPV: 11.8 fL (ref 7.5–12.5)
Monocytes Absolute: 873 cells/uL (ref 200–950)
Monocytes Relative: 9 %
Neutro Abs: 5141 cells/uL (ref 1500–7800)
Neutrophils Relative %: 53 %
Platelets: 319 10*3/uL (ref 140–400)
RBC: 3.99 MIL/uL (ref 3.80–5.10)
RDW: 13.1 % (ref 11.0–15.0)
WBC: 9.7 10*3/uL (ref 3.8–10.8)

## 2015-10-22 LAB — HEPATIC FUNCTION PANEL
ALT: 14 U/L (ref 6–29)
AST: 22 U/L (ref 10–35)
Albumin: 4.3 g/dL (ref 3.6–5.1)
Alkaline Phosphatase: 45 U/L (ref 33–130)
Bilirubin, Direct: 0.1 mg/dL (ref <=0.2)
Indirect Bilirubin: 0.3 mg/dL (ref 0.2–1.2)
Total Bilirubin: 0.4 mg/dL (ref 0.2–1.2)
Total Protein: 7.3 g/dL (ref 6.1–8.1)

## 2015-10-22 LAB — BASIC METABOLIC PANEL WITH GFR
BUN: 36 mg/dL — ABNORMAL HIGH (ref 7–25)
CO2: 29 mmol/L (ref 20–31)
Calcium: 10.3 mg/dL (ref 8.6–10.4)
Chloride: 103 mmol/L (ref 98–110)
Creat: 1.66 mg/dL — ABNORMAL HIGH (ref 0.60–0.88)
GFR, Est African American: 33 mL/min — ABNORMAL LOW (ref >=60)
GFR, Est Non African American: 28 mL/min — ABNORMAL LOW (ref >=60)
Glucose, Bld: 132 mg/dL — ABNORMAL HIGH (ref 65–99)
Potassium: 4.7 mmol/L (ref 3.5–5.3)
Sodium: 142 mmol/L (ref 135–146)

## 2015-10-22 LAB — LIPID PANEL
Cholesterol: 177 mg/dL (ref 125–200)
HDL: 68 mg/dL (ref >=46)
LDL Cholesterol: 90 mg/dL (ref <130)
Total CHOL/HDL Ratio: 2.6 Ratio (ref <=5.0)
Triglycerides: 96 mg/dL (ref <150)
VLDL: 19 mg/dL (ref <30)

## 2015-10-22 LAB — TSH: TSH: 2.17 mIU/L

## 2015-10-22 LAB — MAGNESIUM: Magnesium: 1.8 mg/dL (ref 1.5–2.5)

## 2015-10-22 NOTE — Progress Notes (Signed)
Patient ID: [name], female   DOB: [birth year], 82 y.o.   MRN: [id number]  [facility]                       [name], M.D.        [name]. Silverio Lay, P.A.-C       Starlyn Skeans, P.A.-C   [facility]                [address] [place] [id number] Telephone [phone] Telefax [phone] ______________________________________________________________________     This very nice 82 y.o. MWF presents for 6 month follow up with Hypertension, Hyperlipidemia, Insulin requiring T2_DM and Vitamin D Deficiency.      Patient is treated for HTN  Since circa 2008 & BP has been controlled at home. Today's BP  is 124/60 mmHg. Patient has had no complaints of any cardiac type chest pain, palpitations, dyspnea/orthopnea/PND, dizziness, claudication, or dependent edema.     Hyperlipidemia is controlled with diet & meds. Patient denies myalgias or other med SE's. Last Lipids were at goal with Cholesterol 160; HDL 54; LDL 64; but elevated Triglycerides 208 on 04/15/2015:      Also, the patient has history of poorly controlled  Insulin requiring T2_DM  Predating since 1996 on oral agent and started on insulins in 2000. She also has hx/o CKD3 (GFR 33 ml/min) consequent of her DM and in addition she  has painful Diabetic peripheral sensory Neuropathy. She has had no symptoms of reactive hypoglycemia, diabetic polys or visual blurring, but does c/o painful burning dysthesias of her feet.   Last A1c was 7.5% on 04/15/2015.  Recently she was seen again By Dr Buddy Duty for her Diabetes as she had seen him in previous years.  She reports for the last 2 weeks that her  CBG's have been much better w/o any changes in therapy      Further, the patient also has history of Vitamin D Deficiency and supplements vitamin D without any suspected side-effects. Last vitamin D was very low at 36 on  04/15/2015.    Medication Sig  .  aspirin 81 MG tablet Take 81 mg by mouth daily.  . ASTELIN nasal spray Place 2 sprays into both nostrils 2 (two) times daily. Use in each nostril as directed  . bisoprolol-hctz (ZIAC) 5-6.25 MG  Take 1 tablet by mouth  daily for blood pressure  . Cetirizine  10 MG CAPS Take 1 capsule (10 mg total) by mouth at bedtime.  Marland Kitchen VITAMIN D3 2000 UNITS TABS Take 1 capsule by mouth 2 (two) times daily.  . fenofibrate  134 MG capsule TAKE ONE CAPSULE BY MOUTH  DAILY FOR BLOOD FATS  . FLONASE nasal spray Place 2 sprays into both nostrils daily.  Marland Kitchen gabapentin  600 MG tablet Take 1 tablet by mouth 4  times daily for pain  .  HUMULIN 70/30 PEN Inject 5-10 units [place] tid  with meals,  on a sliding scale.  DX-11.22  . LANTUS SOLOSTAR 100 UNIT/ML  INJECT 30 UNITS INTO THE SKIN AT BEDTIME. OR AS DIRECTED TO CONTROL BLOOD SUGARS  . losartan  100 MG tablet TAKE 1 TABLET BY MOUTH  DAILY FOR HIGH BLOOD  PRESSURE, HEART, AND  KIDNEYS  . Magnesium 500 MG CAPS Take 500 mg by mouth 2 (two) times daily.  Marland Kitchen  meloxicam 15 MG tablet Take 1/2 to 1 tablet daily  with food for pain and  inflammation  . metFORMIN  1000 MG tablet TAKE ONE TABLET TWO TIMES A DAY AFTER MEALS FOR  DIABETES  . HAIR/SKIN/NAILs Take by mouth 2 (two) times daily.  . pravastatin  40 MG tablet Take 1 tablet by mouth at  bedtime for cholesterol  . rOPINIRole  3 MG tablet TAKE 1 TABLET BY MOUTH  TWICE DAILY AND TAKE TWO  TABLETS AT BEDTIME FOR  RESTLESS LEGS  . traMADol  50 MG tablet Take 1/2 to 1 tablet 4 x day or every 4 hours if needed for severe pain  . LAC-HYDRIN 12 % lotion Apply 1 application topically as needed for dry skin.  Marland Kitchen gabapentin  300 MG capsule TAKE 1 CAPSULE BY MOUTH THREE TIMES DAILY TO FOUR TIMES DAILY FOR DIABETIC NEUROPATHY PAIN  . triamcinolone crm  0.1 % Apply 1 application topically daily.   Allergies  Allergen Reactions  . Morphine And Related Other (See Comments)    fuzzy feeling   PMHx:   Past Medical History  Diagnosis Date   . Liver lesion   . Chronic back pain   . Neuropathy ([place])   . Diabetic neuropathy ([place])   . Wears glasses   . Type II or unspecified type diabetes mellitus without mention of complication, not stated as uncontrolled   . Hypercholesteremia   . Hypertension   . Arthritis   . Vitamin D deficiency    Immunization History  Administered Date(s) Administered  . DT 01/04/2011  . Influenza Split 01/23/2013  . Influenza, High Dose Seasonal PF 02/27/2014, 03/11/2015  . Pneumococcal Conjugate-13 02/27/2014  . Pneumococcal Polysaccharide-23 01/06/2012   Past Surgical History  Procedure Laterality Date  . Abdominal hysterectomy    . Cesarean section  x 3     . Back surgery - lumb lam-fusion  1993  . Appendectomy    . Colonoscopy    . Eye surgery      both cataracts  . Trigger finger release  - Hessie Dibble, MD  01/10/2012   FHx:    Reviewed / unchanged  SHx:    Reviewed / unchanged  Systems Review:  Constitutional: Denies fever, chills, wt changes, headaches, insomnia, fatigue, night sweats, change in appetite. Eyes: Denies redness, blurred vision, diplopia, discharge, itchy, watery eyes.  ENT: Denies discharge, congestion, post nasal drip, epistaxis, sore throat, earache, hearing loss, dental pain, tinnitus, vertigo, sinus pain, snoring.  CV: Denies chest pain, palpitations, irregular heartbeat, syncope, dyspnea, diaphoresis, orthopnea, PND, claudication or edema. Respiratory: denies cough, dyspnea, DOE, pleurisy, hoarseness, laryngitis, wheezing.  Gastrointestinal: Denies dysphagia, odynophagia, heartburn, reflux, water brash, abdominal pain or cramps, nausea, vomiting, bloating, diarrhea, constipation, hematemesis, melena, hematochezia  or hemorrhoids. Genitourinary: Denies dysuria, frequency, urgency, nocturia, hesitancy, discharge, hematuria or flank pain. Musculoskeletal: Denies arthralgias, myalgias, stiffness, jt. swelling, pain, limping or strain/sprain.  Skin: Denies  pruritus, rash, hives, warts, acne, eczema or change in skin lesion(s). Neuro: No weakness, tremor, incoordination, spasms, but does have painful paresthesias as above.  Psychiatric: Denies confusion, memory loss or sensory loss. Endo: Denies change in weight, skin or hair change.  Heme/Lymph: No excessive bleeding, bruising or enlarged lymph nodes.  Physical Exam  BP 124/60   Pulse 60  Temp97.3 F  Resp 16  Ht 5\' 4"   Wt 130 lb 6.4 oz     BMI 22.37   Appears well nourished and in no distress.  Eyes: PERRLA,  EOMs, conjunctiva no swelling or erythema. Sinuses: No frontal/maxillary tenderness ENT/Mouth: EAC's clear, TM's nl w/o erythema, bulging. Nares clear w/o erythema, swelling, exudates. Oropharynx clear without erythema or exudates. Oral hygiene is good. Tongue normal, non obstructing. Hearing intact.  Neck: Supple. Thyroid nl. Car 2+/2+ without bruits, nodes or JVD. Chest: Respirations nl with BS clear & equal w/o rales, rhonchi, wheezing or stridor.  Cor: Heart sounds normal w/ regular rate and rhythm without sig. murmurs, gallops, clicks, or rubs. Peripheral pulses normal and equal  without edema.  Abdomen: Soft & bowel sounds normal. Non-tender w/o guarding, rebound, hernias, masses, or organomegaly.  Lymphatics: Unremarkable.  Musculoskeletal: Full ROM all peripheral extremities, joint stability, 5/5 strength, and normal gait.  Skin: Warm, dry without exposed rashes, lesions or ecchymosis apparent.  Neuro: Cranial nerves intact.  DTR's 1+ in UE's and absent in LE's.  Motor testing grossly intact. Decreased sensory to Monofilament in a distal stocking distribution at the mid feet bilaterally. Pysch: Alert & oriented x 3.  Insight and judgement nl & appropriate. No ideations.  Assessment and Plan:  1. Essential hypertension  - Continue monitor blood pressure at home. Continue diet/meds same. - TSH  2. Mixed hyperlipidemia  - Continue diet/meds, exercise,& lifestyle  modifications. Continue monitor periodic cholesterol/liver & renal functions - Lipid panel - TSH  3. Uncontrolled secondary diabetes mellitus with stage 3 CKD (GFR 33) (HCC)   - Continue diet, exercise, lifestyle modifications. Monitor appropriate labs. - VITAMIN D 25 Hydroxy   4. Vitamin D deficiency  - Continue supplementation.  5. Medication management   - CBC with Differential/Platelet - BASIC METABOLIC PANEL WITH GFR - Hepatic function panel - Magnesium   Recommended regular exercise, BP monitoring, weight control, and discussed med and SE's. Recommended labs to assess and monitor clinical status. Further disposition pending results of labs. Over 30 minutes of exam, counseling, chart review was performed

## 2015-10-22 NOTE — Patient Instructions (Signed)

## 2015-10-23 LAB — VITAMIN D 25 HYDROXY (VIT D DEFICIENCY, FRACTURES): Vit D, 25-Hydroxy: 50 ng/mL (ref 30–100)

## 2015-10-30 DIAGNOSIS — H40052 Ocular hypertension, left eye: Secondary | ICD-10-CM | POA: Diagnosis not present

## 2015-10-30 DIAGNOSIS — Z961 Presence of intraocular lens: Secondary | ICD-10-CM | POA: Diagnosis not present

## 2015-10-30 DIAGNOSIS — H40012 Open angle with borderline findings, low risk, left eye: Secondary | ICD-10-CM | POA: Diagnosis not present

## 2015-10-30 DIAGNOSIS — E119 Type 2 diabetes mellitus without complications: Secondary | ICD-10-CM | POA: Diagnosis not present

## 2015-10-30 DIAGNOSIS — H353231 Exudative age-related macular degeneration, bilateral, with active choroidal neovascularization: Secondary | ICD-10-CM | POA: Diagnosis not present

## 2015-11-11 DIAGNOSIS — H401111 Primary open-angle glaucoma, right eye, mild stage: Secondary | ICD-10-CM | POA: Diagnosis not present

## 2015-11-11 DIAGNOSIS — H401122 Primary open-angle glaucoma, left eye, moderate stage: Secondary | ICD-10-CM | POA: Diagnosis not present

## 2015-11-17 DIAGNOSIS — H353211 Exudative age-related macular degeneration, right eye, with active choroidal neovascularization: Secondary | ICD-10-CM | POA: Diagnosis not present

## 2015-11-17 DIAGNOSIS — H353221 Exudative age-related macular degeneration, left eye, with active choroidal neovascularization: Secondary | ICD-10-CM | POA: Diagnosis not present

## 2015-11-17 DIAGNOSIS — H353133 Nonexudative age-related macular degeneration, bilateral, advanced atrophic without subfoveal involvement: Secondary | ICD-10-CM | POA: Diagnosis not present

## 2015-11-19 DIAGNOSIS — H353211 Exudative age-related macular degeneration, right eye, with active choroidal neovascularization: Secondary | ICD-10-CM | POA: Diagnosis not present

## 2015-11-24 DIAGNOSIS — M461 Sacroiliitis, not elsewhere classified: Secondary | ICD-10-CM | POA: Diagnosis not present

## 2015-11-24 DIAGNOSIS — M47816 Spondylosis without myelopathy or radiculopathy, lumbar region: Secondary | ICD-10-CM | POA: Diagnosis not present

## 2015-12-08 DIAGNOSIS — Z961 Presence of intraocular lens: Secondary | ICD-10-CM | POA: Diagnosis not present

## 2015-12-08 DIAGNOSIS — H401111 Primary open-angle glaucoma, right eye, mild stage: Secondary | ICD-10-CM | POA: Diagnosis not present

## 2015-12-08 DIAGNOSIS — H401122 Primary open-angle glaucoma, left eye, moderate stage: Secondary | ICD-10-CM | POA: Diagnosis not present

## 2015-12-21 ENCOUNTER — Ambulatory Visit (INDEPENDENT_AMBULATORY_CARE_PROVIDER_SITE_OTHER): Payer: Medicare Other | Admitting: Physician Assistant

## 2015-12-21 ENCOUNTER — Ambulatory Visit (HOSPITAL_COMMUNITY)
Admission: RE | Admit: 2015-12-21 | Discharge: 2015-12-21 | Disposition: A | Discharge status: Home / Self Care | Payer: Medicare Other | Source: Ambulatory Visit | Attending: Physician Assistant | Admitting: Physician Assistant

## 2015-12-21 ENCOUNTER — Telehealth: Payer: Self-pay | Admitting: Physician Assistant

## 2015-12-21 ENCOUNTER — Ambulatory Visit: Payer: Self-pay | Admitting: Internal Medicine

## 2015-12-21 ENCOUNTER — Encounter: Payer: Self-pay | Admitting: Physician Assistant

## 2015-12-21 VITALS — BP 126/72 | HR 56 | Temp 98.4°F | Resp 14 | Ht 64.0 in | Wt 127.4 lb

## 2015-12-21 DIAGNOSIS — R109 Unspecified abdominal pain: Secondary | ICD-10-CM

## 2015-12-21 DIAGNOSIS — R9389 Abnormal findings on diagnostic imaging of other specified body structures: Secondary | ICD-10-CM

## 2015-12-21 DIAGNOSIS — Z23 Encounter for immunization: Secondary | ICD-10-CM

## 2015-12-21 DIAGNOSIS — R634 Abnormal weight loss: Secondary | ICD-10-CM | POA: Insufficient documentation

## 2015-12-21 DIAGNOSIS — M7070 Other bursitis of hip, unspecified hip: Secondary | ICD-10-CM

## 2015-12-21 DIAGNOSIS — R110 Nausea: Secondary | ICD-10-CM | POA: Diagnosis not present

## 2015-12-21 DIAGNOSIS — R5383 Other fatigue: Secondary | ICD-10-CM | POA: Diagnosis not present

## 2015-12-21 DIAGNOSIS — D649 Anemia, unspecified: Secondary | ICD-10-CM | POA: Diagnosis not present

## 2015-12-21 LAB — BASIC METABOLIC PANEL WITH GFR
BUN: 35 mg/dL — ABNORMAL HIGH (ref 7–25)
CO2: 29 mmol/L (ref 20–31)
Calcium: 10.1 mg/dL (ref 8.6–10.4)
Chloride: 102 mmol/L (ref 98–110)
Creat: 2.03 mg/dL — ABNORMAL HIGH (ref 0.60–0.88)
GFR, Est African American: 26 mL/min — ABNORMAL LOW (ref >=60)
GFR, Est Non African American: 22 mL/min — ABNORMAL LOW (ref >=60)
Glucose, Bld: 189 mg/dL — ABNORMAL HIGH (ref 65–99)
Potassium: 5.1 mmol/L (ref 3.5–5.3)
Sodium: 141 mmol/L (ref 135–146)

## 2015-12-21 LAB — IRON AND TIBC
%SAT: 25 % (ref 11–50)
Iron: 115 ug/dL (ref 45–160)
TIBC: 454 ug/dL — ABNORMAL HIGH (ref 250–450)
UIBC: 339 ug/dL (ref 125–400)

## 2015-12-21 LAB — CBC WITH DIFFERENTIAL/PLATELET
Basophils Absolute: 0 cells/uL (ref 0–200)
Basophils Relative: 0 %
Eosinophils Absolute: 445 cells/uL (ref 15–500)
Eosinophils Relative: 5 %
HCT: 37.2 % (ref 35.0–45.0)
Hemoglobin: 12.3 g/dL (ref 11.7–15.5)
Lymphocytes Relative: 28 %
Lymphs Abs: 2492 cells/uL (ref 850–3900)
MCH: 30.8 pg (ref 27.0–33.0)
MCHC: 33.1 g/dL (ref 32.0–36.0)
MCV: 93 fL (ref 80.0–100.0)
MPV: 11.9 fL (ref 7.5–12.5)
Monocytes Absolute: 712 cells/uL (ref 200–950)
Monocytes Relative: 8 %
Neutro Abs: 5251 cells/uL (ref 1500–7800)
Neutrophils Relative %: 59 %
Platelets: 327 10*3/uL (ref 140–400)
RBC: 4 MIL/uL (ref 3.80–5.10)
RDW: 12.9 % (ref 11.0–15.0)
WBC: 8.9 10*3/uL (ref 3.8–10.8)

## 2015-12-21 LAB — HEPATIC FUNCTION PANEL
ALT: 12 U/L (ref 6–29)
AST: 21 U/L (ref 10–35)
Albumin: 4.1 g/dL (ref 3.6–5.1)
Alkaline Phosphatase: 42 U/L (ref 33–130)
Bilirubin, Direct: 0.1 mg/dL (ref <=0.2)
Indirect Bilirubin: 0.4 mg/dL (ref 0.2–1.2)
Total Bilirubin: 0.5 mg/dL (ref 0.2–1.2)
Total Protein: 7 g/dL (ref 6.1–8.1)

## 2015-12-21 LAB — AMYLASE: Amylase: 40 U/L (ref 0–105)

## 2015-12-21 LAB — FERRITIN: Ferritin: 123 ng/mL (ref 20–288)

## 2015-12-21 MED ORDER — AZITHROMYCIN 250 MG PO TABS: ORAL_TABLET | ORAL | 1 refills | Status: AC

## 2015-12-21 MED ORDER — INSULIN GLARGINE 100 UNIT/ML SOLOSTAR PEN: PEN_INJECTOR | SUBCUTANEOUS | 2 refills | Status: DC

## 2015-12-21 MED ORDER — DEXAMETHASONE SODIUM PHOSPHATE 100 MG/10ML IJ SOLN
10.0000 mg | Freq: Once | INTRAMUSCULAR | Status: AC
  Administered 2015-12-21: 10 mg via INTRAMUSCULAR

## 2015-12-21 MED ORDER — METOCLOPRAMIDE HCL 5 MG PO TABS: 5.0000 mg | ORAL_TABLET | Freq: Three times a day (TID) | ORAL | 1 refills | Status: DC

## 2015-12-21 NOTE — Telephone Encounter (Signed)
Patient with weight loss, fatigue with walking from parking lot to office with abnormality on CXR, will treat with zpak but also get CT to rule out cancer since no fever, chills, cough, etc

## 2015-12-21 NOTE — Progress Notes (Signed)
   Subjective:    Patient ID: [name], female    DOB: 05-18-1929, 82 y.o.   MRN: [id number]  HPI 82 y.o. WF presents with tailbone pain x 2 months and weight loss. She has a remote history of lumbar surgery 20+ years ago, last MRI of spine was 03/16/2014 and showed severe spinal stenosis and DDD. She has bilateral buttocks pain x 2 months, has constant tingling in legs from DM, denies weakness or pain radiating down her leg, worse with sitting for a long time or worse with sitting on something hard. Tramadol helps some.   She has lost 10 lbs over a year, she states she has no energy, decreased appetite she will get hungry but then after a few bites she gets full will have nausea occ or feel need to vomit after eating but denies vomiting, does not walk much anymore, she has fatigue with walking from the parking lot to the office. Denies night sweats. Last colonoscopy was 2006. Last mammogram was 02/2015. Last CXR 2014, she had normal echo 2014.  Wt Readings from Last 10 Encounters:  12/21/15 127 lb 6.4 oz (57.8 kg)  10/22/15 130 lb 6.4 oz (59.1 kg)  06/11/15 130 lb (59 kg)  05/18/15 138 lb 6.4 oz (62.8 kg)  04/15/15 135 lb 12.8 oz (61.6 kg)  01/01/15 137 lb (62.1 kg)  10/16/14 133 lb (60.3 kg)  09/18/14 136 lb (61.7 kg)  06/06/14 141 lb 12.8 oz (64.3 kg)  02/27/14 144 lb 3.2 oz (65.4 kg)   Blood pressure 126/72, pulse (!) 56, temperature 98.4 F (36.9 C), resp. rate 14, height 5\' 4"  (1.626 m), weight 127 lb 6.4 oz (57.8 kg), SpO2 97 %.   Review of Systems  Constitutional: Positive for appetite change, fatigue and unexpected weight change. Negative for activity change, chills, diaphoresis and fever.  HENT: Negative.   Respiratory: Negative.   Cardiovascular: Negative.   Gastrointestinal: Positive for abdominal pain (feels hernia/new mass left of umbilicus), constipation and nausea. Negative for abdominal distention, anal bleeding, blood in stool, diarrhea, rectal pain and vomiting.   Genitourinary: Negative.   Musculoskeletal: Positive for arthralgias and back pain. Negative for gait problem, joint swelling, myalgias, neck pain and neck stiffness.  Neurological: Negative.   Psychiatric/Behavioral: Negative.        Objective:   Physical Exam  Constitutional: She is oriented to person, place, and time. She appears well-developed and well-nourished. No distress.  Cardiovascular: Normal rate and regular rhythm.   Pulmonary/Chest: Effort normal and breath sounds normal. She has no wheezes.  Abdominal: Soft. Bowel sounds are normal. She exhibits mass (possible stool versus tendermass left of umblicus). There is tenderness in the periumbilical area. There is no rigidity, no rebound, no guarding, no tenderness at McBurney's point and negative Murphy's sign.  Musculoskeletal: She exhibits tenderness.  Bilateral ischial bursa tenderness to palpation. No SI pain, full ROM back, normal reflexes.   Neurological: She is alert and oriented to person, place, and time.  Skin: Skin is warm and dry. No rash noted.      Assessment & Plan:  Ischial bursitis Prednisone injections, monitor sugar, ice, if not better follow up ortho No red flag symptoms, no new distal symptoms  Weight loss Get CXR, check labs, + nausea/constipation/fullness- get AB [place]- likely DM gastroparesis Will try reglan if all negative.

## 2015-12-21 NOTE — Patient Instructions (Addendum)
Gastroparesis Gastroparesis, also called delayed gastric emptying, is a condition in which food takes longer than normal to empty from the stomach. The condition is usually long-lasting (chronic). CAUSES This condition may be caused by:  An endocrine disorder, such as hypothyroidism or diabetes. Diabetes is the most common cause of this condition.  A nervous system disease, such as Parkinson disease or multiple sclerosis.  Cancer, infection, or surgery of the stomach or vagus nerve.  A connective tissue disorder, such as scleroderma.  Certain medicines. In most cases, the cause is not known. RISK FACTORS This condition is more likely to develop in:  People with certain disorders, including endocrine disorders, eating disorders, amyloidosis, and scleroderma.  People with certain diseases, including Parkinson disease or multiple sclerosis.  People with cancer or infection of the stomach or vagus nerve.  People who have had surgery on the stomach or vagus nerve.  People who take certain medicines.  Women. SYMPTOMS Symptoms of this condition include:  An early feeling of fullness when eating.  Nausea.  Weight loss.  Vomiting.  Heartburn.  Abdominal bloating.  Inconsistent blood glucose levels.  Lack of appetite.  Acid from the stomach coming up into the esophagus (gastroesophageal reflux).  Spasms of the stomach. Symptoms may come and go. DIAGNOSIS This condition is diagnosed with tests, such as:  Tests that check how long it takes food to move through the stomach and intestines. These tests include:  Upper gastrointestinal (GI) series. In this test, X-rays of the intestines are taken after you drink a liquid. The liquid makes the intestines show up better on the X-rays.  Gastric emptying scintigraphy. In this test, scans are taken after you eat food that contains a small amount of radioactive material.  Wireless capsule GI monitoring system. This test  involves swallowing a capsule that records information about movement through the stomach.  Gastric manometry. This test measures electrical and muscular activity in the stomach. It is done with a thin tube that is passed down the throat and into the stomach.  Endoscopy. This test checks for abnormalities in the lining of the stomach. It is done with a long, thin tube that is passed down the throat and into the stomach.  An ultrasound. This test can help rule out gallbladder disease or pancreatitis as a cause of your symptoms. It uses sound waves to take pictures of the inside of your body. TREATMENT There is no cure for gastroparesis. This condition may be managed with:  Treatment of the underlying condition causing the gastroparesis.  Lifestyle changes, including exercise and dietary changes. Dietary changes can include:  Changes in what and when you eat.  Eating smaller meals more often.  Eating low-fat foods.  Eating low-fiber forms of high-fiber foods, such as cooked vegetables instead of raw vegetables.  Having liquid foods in place of solid foods. Liquid foods are easier to digest.  Medicines. These may be given to control nausea and vomiting and to stimulate stomach muscles.  Getting food through a feeding tube. This may be done in severe cases.  A gastric neurostimulator. This is a device that is inserted into the body with surgery. It helps improve stomach emptying and control nausea and vomiting. HOME CARE INSTRUCTIONS  Follow your health care provider's instructions about exercise and diet.  Take medicines only as directed by your health care provider. SEEK MEDICAL CARE IF:  Your symptoms do not improve with treatment.  You have new symptoms. SEEK IMMEDIATE MEDICAL CARE IF:  You have  severe abdominal pain that does not improve with treatment.  You have nausea that does not go away.  You cannot keep fluids down.   This information is not intended to replace  advice given to you by your health care provider. Make sure you discuss any questions you have with your health care provider.   Document Released: 04/11/2005 Document Revised: 08/26/2014 Document Reviewed: 04/07/2014 Elsevier Interactive Patient Education 2016 [employer]  Bursitis Bursitis is inflammation and irritation of a bursa, which is one of the small, fluid-filled sacs that cushion and protect the moving parts of your body. These sacs are located between bones and muscles, muscle attachments, or skin areas next to bones. A bursa protects these structures from the wear and tear that results from frequent movement. An inflamed bursa causes pain and swelling. Fluid may build up inside the sac. Bursitis is most common near joints, especially the knees, elbows, hips, and shoulders. CAUSES Bursitis can be caused by:   Injury from:  A direct blow, like falling on your knee or elbow.  Overuse of a joint (repetitive stress).  Infection. This can happen if bacteria gets into a bursa through a cut or scrape near a joint.  Diseases that cause joint inflammation, such as gout and rheumatoid arthritis. RISK FACTORS You may be at risk for bursitis if you:   Have a job or hobby that involves a lot of repetitive stress on your joints.  Have a condition that weakens your body's defense system (immune system), such as diabetes, cancer, or HIV.  Lift and reach overhead often.  Kneel or lean on hard surfaces often.  Run or walk often. SIGNS AND SYMPTOMS The most common signs and symptoms of bursitis are:  Pain that gets worse when you move the affected body part or put weight on it.  Inflammation.  Stiffness. Other signs and symptoms may include:  Redness.  Tenderness.  Warmth.  Pain that continues after rest.  Fever and chills. This may occur in bursitis caused by infection. DIAGNOSIS Bursitis may be diagnosed by:   Medical history and physical exam.  MRI.  A  procedure to drain fluid from the bursa with a needle (aspiration). The fluid may be checked for signs of infection or gout.  Blood tests to rule out other causes of inflammation. TREATMENT  Bursitis can usually be treated at home with rest, ice, compression, and elevation (RICE). For mild bursitis, RICE treatment may be all you need. Other treatments may include:  Nonsteroidal anti-inflammatory drugs (NSAIDs) to treat pain and inflammation.  Corticosteroids to fight inflammation. You may have these drugs injected into and around the area of bursitis.  Aspiration of bursitis fluid to relieve pain and improve movement.  Antibiotic medicine to treat an infected bursa.  A splint, brace, or walking aid.  Physical therapy if you continue to have pain or limited movement.  Surgery to remove a damaged or infected bursa. This may be needed if you have a very bad case of bursitis or if other treatments have not worked. HOME CARE INSTRUCTIONS   Take medicines only as directed by your health care provider.  If you were prescribed an antibiotic medicine, finish it all even if you start to feel better.  Rest the affected area as directed by your health care provider.  Keep the area elevated.  Avoid activities that make pain worse.  Apply ice to the injured area:  Place ice in a plastic bag.  Place a towel between your skin  and the bag.  Leave the ice on for 20 minutes, 2-3 times a day.  Use splints, braces, pads, or walking aids as directed by your health care provider.  Keep all follow-up visits as directed by your health care provider. This is important. PREVENTION   Wear knee pads if you kneel often.  Wear sturdy running or walking shoes that fit you well.  Take regular breaks from repetitive activity.  Warm up by stretching before doing any strenuous activity.  Maintain a healthy weight or lose weight as recommended by your health care provider. Ask your health care  provider if you need help.  Exercise regularly. Start any new physical activity gradually. SEEK MEDICAL CARE IF:   Your bursitis is not responding to treatment or home care.  You have a fever.  You have chills.   This information is not intended to replace advice given to you by your health care provider. Make sure you discuss any questions you have with your health care provider.   Document Released: 04/08/2000 Document Revised: 12/31/2014 Document Reviewed: 07/01/2013 Elsevier Interactive Patient Education [employer].

## 2015-12-21 NOTE — Addendum Note (Signed)
Addended by: Elsie Amis D on: 12/21/2015 02:15 PM   Modules accepted: Orders

## 2015-12-31 ENCOUNTER — Ambulatory Visit
Admission: RE | Admit: 2015-12-31 | Discharge: 2015-12-31 | Disposition: A | Discharge status: Home / Self Care | Payer: Medicare Other | Source: Ambulatory Visit | Attending: Physician Assistant | Admitting: Physician Assistant

## 2015-12-31 DIAGNOSIS — R634 Abnormal weight loss: Secondary | ICD-10-CM

## 2015-12-31 DIAGNOSIS — R918 Other nonspecific abnormal finding of lung field: Secondary | ICD-10-CM | POA: Diagnosis not present

## 2015-12-31 DIAGNOSIS — R110 Nausea: Secondary | ICD-10-CM

## 2015-12-31 DIAGNOSIS — R1033 Periumbilical pain: Secondary | ICD-10-CM | POA: Diagnosis not present

## 2015-12-31 DIAGNOSIS — R9389 Abnormal findings on diagnostic imaging of other specified body structures: Secondary | ICD-10-CM

## 2016-01-06 ENCOUNTER — Encounter: Payer: Self-pay | Admitting: Physician Assistant

## 2016-01-06 ENCOUNTER — Ambulatory Visit (INDEPENDENT_AMBULATORY_CARE_PROVIDER_SITE_OTHER): Payer: Medicare Other | Admitting: Physician Assistant

## 2016-01-06 VITALS — BP 116/70 | HR 59 | Temp 97.4°F | Resp 16 | Ht 64.0 in | Wt 130.4 lb

## 2016-01-06 DIAGNOSIS — M25511 Pain in right shoulder: Secondary | ICD-10-CM | POA: Diagnosis not present

## 2016-01-06 MED ORDER — DEXAMETHASONE SODIUM PHOSPHATE 100 MG/10ML IJ SOLN
10.0000 mg | Freq: Once | INTRAMUSCULAR | Status: AC
  Administered 2016-01-06: 10 mg via INTRAMUSCULAR

## 2016-01-06 NOTE — Patient Instructions (Signed)
If not better follow up with ortho Can ice it or heat whichever feels better rest  Biceps Tendon Tendinitis (Distal) With Rehab Tendinitis involves inflammation and pain over the affected tendon. The distal biceps tendon (near the elbow) is vulnerable to tendinitis. Distal biceps tendonitis is usually due to the bony bump near the elbow (bicipital tuberosity) causing increased friction over the tendon. The biceps tendon attaches the biceps muscle to one bone in the elbow and two in the shoulder. It is important for proper function of the elbow and for turning the palm upward (supination). SYMPTOMS   Pain, aching, tenderness, and sometimes warmth or redness over the front of the elbow.  Pain when bending the elbow or turning the palm up, using the wrist, especially if performed against resistance.  Crackling sound (crepitation) when the tendon or elbow is moved or touched. CAUSES  The symptoms of biceps tendonitis are due to inflammation of the tendon. Inflammation may be caused by:  Strain from sudden increase in amount or intensity of activity.  Direct blow or injury to the elbow (uncommon).  Overuse or repetitive elbow bending or wrist rotation, particularly when turning the palm up, or with elbow hyperextension. RISK INCREASES WITH:  Sports that involve contact or overhead arm activity (throwing sports, gymnastics, weightlifting, bodybuilding, rock climbing).  Heavy labor.  Poor strength and flexibility.  Failure to warm up properly before activity.  Injury to other structures of the elbow.  Restraint of the elbow. PREVENTION  Warm up and stretch properly before activity.  Allow time for recovery between activities.  Maintain physical fitness:  Strength, flexibility, and endurance.  Cardiovascular fitness.  Learn and use proper exercise technique. PROGNOSIS  With proper treatment, biceps tendon tendonitis is usually curable within 6 weeks.  RELATED COMPLICATIONS     Longer healing time if not properly treated or if not given enough time to heal.  Chronically inflamed tendon that causes persistent pain with activity, that may progress to constant pain and potentially rupture of the tendon.  Recurring symptoms, especially if activity is resumed too soon, with overuse or with poor technique. TREATMENT  Treatment first involves ice and medicine to reduce pain and inflammation. Modify activities that cause pain, to reduce the chances of causing the condition to get worse. Strengthening and stretching exercises should be performed to promote proper use of the muscles of the elbow. These exercise may be performed at home or with a therapist. Other treatments may be given such as ultrasound or heat therapy. Surgery is usually not recommended.  MEDICATION  If pain medicine is needed, nonsteroidal anti-inflammatory medicines (aspirin and ibuprofen), or other minor pain relievers (acetaminophen), are often advised.  Do not take pain relieving medication for 7 days before surgery.  Prescription pain relievers may be given if your caregiver thinks they are needed. Use only as directed and only as much as you need. HEAT AND COLD:   Cold treatment (icing) should be applied for 10 to 15 minutes every 2 to 3 hours for inflammation and pain, and immediately after activity that aggravates your symptoms. Use ice packs or an ice massage.  Heat treatment may be used before performing stretching and strengthening activities prescribed by your caregiver, physical therapist, or athletic trainer. Use a heat pack or a warm water soak. SEEK MEDICAL CARE IF:   Symptoms get worse or do not improve in 2 weeks, despite treatment.  New, unexplained symptoms develop. (Drugs used in treatment may produce side effects.) EXERCISES  RANGE OF  MOTION (ROM) AND STRETCHING EXERCISES - Biceps Tendon Tendinitis (Distal) These exercises may help you when beginning to rehabilitate your injury.  Your symptoms may go away with or without further involvement from your physician, physical therapist, or athletic trainer. While completing these exercises, remember:   Restoring tissue flexibility helps normal motion to return to the joints. This allows healthier, less painful movement and activity.  An effective stretch should be held for at least 30 seconds.  A stretch should never be painful. You should only feel a gentle lengthening or release in the stretched tissue. STRETCH - Elbow Flexors   Lie on a firm bed or countertop on your back. Be sure that you are in a comfortable position which will allow you to relax your arm muscles.  Place a folded towel under your right / left upper arm, so that your elbow and shoulder are at the same height. Extend your arm; your elbow should not rest on the bed or towel.  Allow the weight of your hand to straighten your elbow. Keep your arm and chest muscles relaxed. Your caretaker may ask you to increase the intensity of your stretch by adding a small wrist or hand weight.  Hold for __________ seconds. You should feel a stretch on the inside of your elbow. Slowly return to the starting position. Repeat __________ times. Complete this exercise __________ times per day. RANGE OF MOTION - Supination, Active   Stand or sit with your elbows at your side. Bend your right / left elbow to 90 degrees.  Turn your palm upward until you feel a gentle stretch on the inside of your forearm.  Hold this position for __________ seconds. Slowly release and return to the starting position. Repeat __________ times. Complete this stretch __________ times per day.  RANGE OF MOTION - Pronation, Active   Stand or sit with your elbows at your side. Bend your right / left elbow to 90 degrees.  Turn your palm downward until you feel a gentle stretch on the top of your forearm.  Hold this position for __________ seconds. Slowly release and return to the starting  position. Repeat __________ times. Complete this stretch __________ times per day.  STRENGTHENING EXERCISES - Biceps Tendon Tendinitis (Distal) These exercises may help you when beginning to rehabilitate your injury. They may resolve your symptoms with or without further involvement from your physician, physical therapist or athletic trainer. While completing these exercises, remember:   Muscles can gain both the endurance and the strength needed for everyday activities through controlled exercises.  Complete these exercises as instructed by your physician, physical therapist or athletic trainer. Increase the resistance and repetitions only as guided.  You may experience muscle soreness or fatigue, but the pain or discomfort you are trying to eliminate should never get worse during these exercises. If this pain does get worse, stop and make sure you are following the directions exactly. If the pain is still present after adjustments, discontinue the exercise until you can discuss the trouble with your clinician. STRENGTH - Elbow Flexors, Isometric   Stand or sit upright on a firm surface. Place your right / left arm so that your hand is palm-up and at the height of your waist.  Place your opposite hand on top of your forearm. Gently push down as your right / left arm resists. Push as hard as you can with both arms without causing any pain or movement at your right / left elbow. Hold this stationary position for __________  seconds.  Gradually release the tension in both arms. Allow your muscles to relax completely before repeating. Repeat __________ times. Complete this exercise __________ times per day. STRENGTH - Forearm Supinators   Sit with your right / left forearm supported on a table, keeping your elbow below shoulder height. Rest your hand over the edge, palm down.  Gently grip a hammer or a soup ladle.  Without moving your elbow, slowly turn your palm and hand upward to a "thumbs-up"  position.  Hold this position for __________ seconds. Slowly return to the starting position. Repeat __________ times. Complete this exercise __________ times per day.  STRENGTH - Forearm Pronators   Sit with your right / left forearm supported on a table, keeping your elbow below shoulder height. Rest your hand over the edge, palm up.  Gently grip a hammer or a soup ladle.  Without moving your elbow, slowly turn your palm and hand upward to a "thumbs-up" position.  Hold this position for __________ seconds. Slowly return to the starting position. Repeat __________ times. Complete this exercise __________ times per day.  STRENGTH - Elbow Flexors, Supinated  With good posture, stand or sit on a firm chair without armrests. Allow your right / left arm to rest at your side with your palm facing forward.  Holding a __________ weight, or gripping a rubber exercise band or tubing, bring your hand toward your shoulder.  Allow your muscles to control the resistance as your hand returns to your side. Repeat __________ times. Complete this exercise __________ times per day.  STRENGTH - Elbow Flexors, Neutral  With good posture, stand or sit on a firm chair without armrests. Allow your right / left arm to rest at your side with your thumb facing forward.  Holding a __________ weight, or gripping a rubber exercise band or tubing, bring your hand toward your shoulder.  Allow your muscles to control the resistance as your hand returns to your side. Repeat __________ times. Complete this exercise __________ times per day.    This information is not intended to replace advice given to you by your health care provider. Make sure you discuss any questions you have with your health care provider.   Document Released: 04/11/2005 Document Revised: 05/02/2014 Document Reviewed: 07/24/2008 Elsevier Interactive Patient Education [employer].

## 2016-01-06 NOTE — Progress Notes (Signed)
Subjective:    Patient ID: [name], female    DOB: [birth year], 82 y.o.   MRN: [id number]  HPI 82 y.o. right handed WF presents with right shoulder pain x 2 days. She has had rotator cuff surgery on her left shoulder many years back.. Pain anterior shoulder with some posterior, pain with abduction, lifting objects.   Blood pressure 116/70, pulse (!) 59, temperature 97.4 F (36.3 C), resp. rate 16, height 5' 4"  (1.626 m), weight 130 lb 6.4 oz (59.1 kg), SpO2 97 %.   Medications Current Outpatient Prescriptions on File Prior to Visit  Medication Sig  . ACCU-CHEK AVIVA PLUS test strip Test 3 times a day  . aspirin 81 MG tablet Take 81 mg by mouth daily.  . ASSURE COMFORT LANCETS 30G MISC Check blood sugar 3 times daily-DX-E11.22  . azelastine (ASTELIN) 0.1 % nasal spray Place 2 sprays into both nostrils 2 (two) times daily. Use in each nostril as directed  . bisoprolol-hydrochlorothiazide (ZIAC) 5-6.25 MG tablet Take 1 tablet by mouth  daily for blood pressure  . Blood Glucose Calibration (ACCU-CHEK AVIVA) SOLN Check blood sugar 3 times daily-[id number]  . Blood Glucose Monitoring Suppl (ACCU-CHEK AVIVA PLUS) W/DEVICE KIT   . Cholecalciferol (VITAMIN D3) 2000 UNITS TABS Take 1 capsule by mouth 2 (two) times daily.  . dorzolamide-timolol (COSOPT) 22.3-6.8 MG/ML ophthalmic solution   . EASY COMFORT INSULIN SYRINGE 30G X 1/2" 0.5 ML MISC   . fenofibrate micronized (LOFIBRA) 134 MG capsule TAKE ONE CAPSULE BY MOUTH  DAILY FOR BLOOD FATS  . fluticasone (FLONASE) 50 MCG/ACT nasal spray Place 2 sprays into both nostrils daily.  Marland Kitchen gabapentin (NEURONTIN) 600 MG tablet Take 1 tablet by mouth 4  times daily for pain  . Insulin Glargine (LANTUS SOLOSTAR) 100 UNIT/ML Solostar Pen INJECT 30 UNITS INTO THE SKIN AT BEDTIME. OR AS DIRECTED TO CONTROL BLOOD SUGARS  . losartan (COZAAR) 100 MG tablet TAKE 1 TABLET BY MOUTH  DAILY FOR HIGH BLOOD  PRESSURE, HEART, AND  KIDNEYS  . Magnesium 500 MG CAPS  Take 500 mg by mouth 2 (two) times daily.  . meloxicam (MOBIC) 15 MG tablet Take 1/2 to 1 tablet daily  with food for pain and  inflammation  . metFORMIN (GLUCOPHAGE) 1000 MG tablet TAKE ONE TABLET TWO TIMES A DAY AFTER MEALS FOR  DIABETES  . metoCLOPramide (REGLAN) 5 MG tablet Take 1 tablet (5 mg total) by mouth 3 (three) times daily.  . Multiple Vitamins-Minerals (HAIR/SKIN/NAILS) TABS Take by mouth 2 (two) times daily.  . pravastatin (PRAVACHOL) 40 MG tablet Take 1 tablet by mouth at  bedtime for cholesterol  . rOPINIRole (REQUIP) 3 MG tablet TAKE 1 TABLET BY MOUTH  TWICE DAILY AND TAKE TWO  TABLETS AT BEDTIME FOR  RESTLESS LEGS  . traMADol (ULTRAM) 50 MG tablet Take 1/2 to 1 tablet 4 x day or every 4 hours if needed for severe pain  . VICTOZA 18 MG/3ML SOPN    No current facility-administered medications on file prior to visit.     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  Review of Systems  Constitutional: Negative.   Musculoskeletal: Positive for arthralgias. Negative for back pain, gait problem, joint swelling, myalgias and neck pain.  Skin: Negative.  Negative for  rash.  Neurological: Negative.        Objective:   Physical Exam  Constitutional: She is oriented to person, place, and time. She appears well-developed and well-nourished. No distress.  Musculoskeletal:  Right shoulder with normal distal neurovascular exam, right bicipital groove tenderness, pain with abduction to 90-120 degrees, + empty can test, no pain with internal/external rotation.   Neurological: She is alert and oriented to person, place, and time.  Skin: Skin is warm and dry. No rash noted.       Assessment & Plan:   Bicipital tendonitis with supraspinatus tenderness Discussed benefits and risk of bleeding, infection, tendon rupture. A trigger point injection was performed at the site of maximal tenderness at bicipital groove/tendon using 1% plain Lidocaine and Dexamethasone. This was well tolerated, and followed by immediate relief of pain.

## 2016-01-07 ENCOUNTER — Other Ambulatory Visit: Payer: Self-pay | Admitting: Internal Medicine

## 2016-01-19 DIAGNOSIS — E119 Type 2 diabetes mellitus without complications: Secondary | ICD-10-CM | POA: Diagnosis not present

## 2016-01-19 DIAGNOSIS — Z794 Long term (current) use of insulin: Secondary | ICD-10-CM | POA: Diagnosis not present

## 2016-01-20 DIAGNOSIS — H353211 Exudative age-related macular degeneration, right eye, with active choroidal neovascularization: Secondary | ICD-10-CM | POA: Diagnosis not present

## 2016-01-25 ENCOUNTER — Other Ambulatory Visit: Payer: Self-pay | Admitting: Internal Medicine

## 2016-01-25 DIAGNOSIS — I10 Essential (primary) hypertension: Secondary | ICD-10-CM

## 2016-01-25 DIAGNOSIS — M7071 Other bursitis of hip, right hip: Secondary | ICD-10-CM | POA: Diagnosis not present

## 2016-01-25 MED ORDER — LOSARTAN POTASSIUM 100 MG PO TABS: ORAL_TABLET | ORAL | 1 refills | Status: DC

## 2016-01-26 ENCOUNTER — Encounter: Payer: Self-pay | Admitting: Internal Medicine

## 2016-01-26 ENCOUNTER — Ambulatory Visit (INDEPENDENT_AMBULATORY_CARE_PROVIDER_SITE_OTHER): Payer: Medicare Other | Admitting: Internal Medicine

## 2016-01-26 VITALS — BP 158/80 | HR 62 | Temp 98.0°F | Resp 16 | Ht 64.0 in | Wt 126.0 lb

## 2016-01-26 DIAGNOSIS — M7070 Other bursitis of hip, unspecified hip: Secondary | ICD-10-CM

## 2016-01-26 DIAGNOSIS — E1040 Type 1 diabetes mellitus with diabetic neuropathy, unspecified: Secondary | ICD-10-CM | POA: Diagnosis not present

## 2016-01-26 DIAGNOSIS — IMO0002 Reserved for concepts with insufficient information to code with codable children: Secondary | ICD-10-CM

## 2016-01-26 DIAGNOSIS — I10 Essential (primary) hypertension: Secondary | ICD-10-CM | POA: Diagnosis not present

## 2016-01-26 DIAGNOSIS — N183 Chronic kidney disease, stage 3 (moderate): Secondary | ICD-10-CM | POA: Diagnosis not present

## 2016-01-26 DIAGNOSIS — E1322 Other specified diabetes mellitus with diabetic chronic kidney disease: Secondary | ICD-10-CM | POA: Diagnosis not present

## 2016-01-26 DIAGNOSIS — E559 Vitamin D deficiency, unspecified: Secondary | ICD-10-CM | POA: Diagnosis not present

## 2016-01-26 DIAGNOSIS — J189 Pneumonia, unspecified organism: Secondary | ICD-10-CM

## 2016-01-26 DIAGNOSIS — Z79899 Other long term (current) drug therapy: Secondary | ICD-10-CM | POA: Diagnosis not present

## 2016-01-26 DIAGNOSIS — E1365 Other specified diabetes mellitus with hyperglycemia: Secondary | ICD-10-CM | POA: Diagnosis not present

## 2016-01-26 DIAGNOSIS — E11319 Type 2 diabetes mellitus with unspecified diabetic retinopathy without macular edema: Secondary | ICD-10-CM | POA: Diagnosis not present

## 2016-01-26 DIAGNOSIS — H353221 Exudative age-related macular degeneration, left eye, with active choroidal neovascularization: Secondary | ICD-10-CM | POA: Diagnosis not present

## 2016-01-26 DIAGNOSIS — E782 Mixed hyperlipidemia: Secondary | ICD-10-CM

## 2016-01-26 LAB — LIPID PANEL
Cholesterol: 127 mg/dL (ref 125–200)
HDL: 47 mg/dL (ref >=46)
LDL Cholesterol: 57 mg/dL (ref <130)
Total CHOL/HDL Ratio: 2.7 Ratio (ref <=5.0)
Triglycerides: 115 mg/dL (ref <150)
VLDL: 23 mg/dL (ref <30)

## 2016-01-26 LAB — CBC WITH DIFFERENTIAL/PLATELET
Basophils Absolute: 0 cells/uL (ref 0–200)
Basophils Relative: 0 %
Eosinophils Absolute: 460 cells/uL (ref 15–500)
Eosinophils Relative: 5 %
HCT: 32.8 % — ABNORMAL LOW (ref 35.0–45.0)
Hemoglobin: 10.8 g/dL — ABNORMAL LOW (ref 11.7–15.5)
Lymphocytes Relative: 47 %
Lymphs Abs: 4324 cells/uL — ABNORMAL HIGH (ref 850–3900)
MCH: 30.8 pg (ref 27.0–33.0)
MCHC: 32.9 g/dL (ref 32.0–36.0)
MCV: 93.4 fL (ref 80.0–100.0)
MPV: 11.5 fL (ref 7.5–12.5)
Monocytes Absolute: 828 cells/uL (ref 200–950)
Monocytes Relative: 9 %
Neutro Abs: 3588 cells/uL (ref 1500–7800)
Neutrophils Relative %: 39 %
Platelets: 278 10*3/uL (ref 140–400)
RBC: 3.51 MIL/uL — ABNORMAL LOW (ref 3.80–5.10)
RDW: 13.4 % (ref 11.0–15.0)
WBC: 9.2 10*3/uL (ref 3.8–10.8)

## 2016-01-26 LAB — HEPATIC FUNCTION PANEL
ALT: 15 U/L (ref 6–29)
AST: 28 U/L (ref 10–35)
Albumin: 4.1 g/dL (ref 3.6–5.1)
Alkaline Phosphatase: 56 U/L (ref 33–130)
Bilirubin, Direct: 0.1 mg/dL (ref <=0.2)
Indirect Bilirubin: 0.2 mg/dL (ref 0.2–1.2)
Total Bilirubin: 0.3 mg/dL (ref 0.2–1.2)
Total Protein: 6.7 g/dL (ref 6.1–8.1)

## 2016-01-26 LAB — BASIC METABOLIC PANEL WITH GFR
BUN: 43 mg/dL — ABNORMAL HIGH (ref 7–25)
CO2: 26 mmol/L (ref 20–31)
Calcium: 9.7 mg/dL (ref 8.6–10.4)
Chloride: 105 mmol/L (ref 98–110)
Creat: 1.8 mg/dL — ABNORMAL HIGH (ref 0.60–0.88)
GFR, Est African American: 29 mL/min — ABNORMAL LOW (ref >=60)
GFR, Est Non African American: 25 mL/min — ABNORMAL LOW (ref >=60)
Glucose, Bld: 151 mg/dL — ABNORMAL HIGH (ref 65–99)
Potassium: 4.4 mmol/L (ref 3.5–5.3)
Sodium: 140 mmol/L (ref 135–146)

## 2016-01-26 LAB — TSH: TSH: 1.43 mIU/L

## 2016-01-26 MED ORDER — LOSARTAN POTASSIUM 100 MG PO TABS: ORAL_TABLET | ORAL | 1 refills | Status: DC

## 2016-01-26 NOTE — Progress Notes (Signed)
Assessment and Plan:  Hypertension:  Recheck still mildly up -she will monitor at home and call office if consistently over 150/90 -Continue medication,  -monitor blood pressure at home.  -Continue DASH diet.   -Reminder to go to the ER if any CP, SOB, nausea, dizziness, severe HA, changes vision/speech, left arm numbness and tingling, and jaw pain.  Cholesterol: -Continue diet and exercise.  -Check cholesterol.   Pre-diabetes: -Continue diet and exercise.  -Check A1C  Vitamin D Def: -check level -continue medications.   Ischial bursitis -resolved  CAP -resolved and feeling much better  Continue diet and meds as discussed. Further disposition pending results of labs.  HPI 82 y.o. female  presents for 3 month follow up with hypertension, hyperlipidemia, prediabetes and vitamin D.   Her blood pressure has been controlled at home, today their BP is BP: (!) 158/80.   She does not workout. She denies chest pain, shortness of breath, dizziness.   She is on cholesterol medication and denies myalgias. Her cholesterol is at goal. The cholesterol last visit was:   Lab Results  Component Value Date   CHOL 177 10/22/2015   HDL 68 10/22/2015   LDLCALC 90 10/22/2015   TRIG 96 10/22/2015   CHOLHDL 2.6 10/22/2015     She has been working on diet and exercise for prediabetes, and denies foot ulcerations, hyperglycemia, hypoglycemia , increased appetite, nausea, paresthesia of the feet, polydipsia, polyuria, visual disturbances, vomiting and weight loss. Last A1C in the office was:  Lab Results  Component Value Date   HGBA1C 7.5 (H) 04/15/2015  She reports that her blood sugars have been doing alright.  She reports that this morning it was 134.  She reports that it is doing well.  She is now having more energy and is able to move around more.  Patient is on Vitamin D supplement.  Lab Results  Component Value Date   VD25OH 50 10/22/2015     Patient reports that she did have a  recent issue with pneumonia.  She reports that since she finished  Her treatement for PNA she reports that she feels much better.  She reports that she is doing much better since her iscial bursa was injected.  She is no longer having pain.    Current Medications:  Current Outpatient Prescriptions on File Prior to Visit  Medication Sig Dispense Refill  . ACCU-CHEK AVIVA PLUS test strip Test 3 times a day 300 each 3  . aspirin 81 MG tablet Take 81 mg by mouth daily.    . ASSURE COMFORT LANCETS 30G MISC Check blood sugar 3 times daily-DX-E11.22 300 each 1  . azelastine (ASTELIN) 0.1 % nasal spray Place 2 sprays into both nostrils 2 (two) times daily. Use in each nostril as directed 30 mL 2  . bisoprolol-hydrochlorothiazide (ZIAC) 5-6.25 MG tablet TAKE 1 TABLET BY MOUTH DAILY FOR HIGH BLOOD PRESSURE 90 tablet 1  . Blood Glucose Calibration (ACCU-CHEK AVIVA) SOLN Check blood sugar 3 times daily-DX-E11.22 3 each 1  . Cholecalciferol (VITAMIN D3) 2000 UNITS TABS Take 1 capsule by mouth 2 (two) times daily.    Marland Kitchen EASY COMFORT INSULIN SYRINGE 30G X 1/2" 0.5 ML MISC     . fenofibrate micronized (LOFIBRA) 134 MG capsule TAKE ONE CAPSULE BY MOUTH  DAILY FOR BLOOD FATS 90 capsule 3  . gabapentin (NEURONTIN) 600 MG tablet Take 1 tablet by mouth 4  times daily for pain 360 tablet 2  . losartan (COZAAR) 100 MG tablet TAKE  1 TABLET BY MOUTH  DAILY FOR HIGH BLOOD  PRESSURE, HEART, AND  KIDNEYS 90 tablet 1  . Magnesium 500 MG CAPS Take 500 mg by mouth 2 (two) times daily.    . meloxicam (MOBIC) 15 MG tablet Take 1/2 to 1 tablet daily  with food for pain and  inflammation 90 tablet 0  . metFORMIN (GLUCOPHAGE) 1000 MG tablet TAKE ONE TABLET TWO TIMES A DAY AFTER MEALS FOR  DIABETES 180 tablet 1  . metoCLOPramide (REGLAN) 5 MG tablet Take 1 tablet (5 mg total) by mouth 3 (three) times daily. 90 tablet 1  . Multiple Vitamins-Minerals (HAIR/SKIN/NAILS) TABS Take by mouth 2 (two) times daily.    . pravastatin  (PRAVACHOL) 40 MG tablet Take 1 tablet by mouth at  bedtime for cholesterol 90 tablet 3  . rOPINIRole (REQUIP) 3 MG tablet TAKE 1 TABLET BY MOUTH  TWICE DAILY AND TAKE TWO  TABLETS AT BEDTIME FOR  RESTLESS LEGS 360 tablet 3  . traMADol (ULTRAM) 50 MG tablet Take 1/2 to 1 tablet 4 x day or every 4 hours if needed for severe pain 300 tablet 0   No current facility-administered medications on file prior to visit.     Medical History:  Past Medical History:  Diagnosis Date  . Arthritis   . Chronic back pain   . Diabetic neuropathy ([place])   . Hypercholesteremia   . Hypertension   . Liver lesion   . Neuropathy ([place])   . Type II or unspecified type diabetes mellitus without mention of complication, not stated as uncontrolled   . Vitamin D deficiency   . Wears glasses     Allergies:  Allergies  Allergen Reactions  . Morphine And Related Other (See Comments)    fuzzy feeling     Review of Systems:  Review of Systems  Constitutional: Negative for chills, fever and malaise/fatigue.  HENT: Negative for congestion, ear pain and sore throat.   Eyes: Negative.   Respiratory: Negative for cough, shortness of breath and wheezing.   Cardiovascular: Negative for chest pain, palpitations and leg swelling.  Gastrointestinal: Negative for abdominal pain, blood in stool, constipation, diarrhea, heartburn and melena.  Genitourinary: Negative.   Skin: Negative.   Neurological: Negative for dizziness, sensory change, loss of consciousness and headaches.  Psychiatric/Behavioral: Negative for depression. The patient is not nervous/anxious and does not have insomnia.     Family history- Review and unchanged  Social history- Review and unchanged  Physical Exam: BP (!) 158/80   Pulse 62   Temp 98 F (36.7 C) (Temporal)   Resp 16   Ht 5\' 4"  (1.626 m)   Wt 126 lb (57.2 kg)   BMI 21.63 kg/m  Wt Readings from Last 3 Encounters:  01/26/16 126 lb (57.2 kg)  01/06/16 130 lb 6.4 oz (59.1 kg)   12/21/15 127 lb 6.4 oz (57.8 kg)    General Appearance: Well nourished well developed, in no apparent distress. Eyes: PERRLA, EOMs, conjunctiva no swelling or erythema ENT/Mouth: Ear canals normal without obstruction, swelling, erythma, discharge.  TMs normal bilaterally.  Oropharynx moist, clear, without exudate, or postoropharyngeal swelling. Neck: Supple, thyroid normal,no cervical adenopathy  Respiratory: Respiratory effort normal, Breath sounds clear A&P without rhonchi, wheeze, or rale.  No retractions, no accessory usage. Cardio: RRR with no MRGs. Brisk peripheral pulses without edema.  Abdomen: Soft, + BS,  Non tender, no guarding, rebound, hernias, masses. Musculoskeletal: Full ROM, 5/5 strength, Normal gait Skin: Warm, dry without rashes, lesions, ecchymosis.  Neuro: Awake and oriented X 3, Cranial nerves intact. Normal muscle tone, no cerebellar symptoms. Psych: Normal affect, Insight and Judgment appropriate.    Starlyn Skeans, PA-C 3:54 PM [facility]

## 2016-01-27 ENCOUNTER — Other Ambulatory Visit: Payer: Self-pay | Admitting: Internal Medicine

## 2016-01-27 LAB — HEMOGLOBIN A1C
Hgb A1c MFr Bld: 7.7 % — ABNORMAL HIGH (ref <5.7)
Mean Plasma Glucose: 174 mg/dL

## 2016-01-27 MED ORDER — GLIPIZIDE 5 MG PO TABS: ORAL_TABLET | ORAL | 11 refills | Status: DC

## 2016-02-09 ENCOUNTER — Other Ambulatory Visit: Payer: Self-pay | Admitting: Internal Medicine

## 2016-02-09 DIAGNOSIS — Z1231 Encounter for screening mammogram for malignant neoplasm of breast: Secondary | ICD-10-CM

## 2016-02-29 ENCOUNTER — Ambulatory Visit (INDEPENDENT_AMBULATORY_CARE_PROVIDER_SITE_OTHER): Payer: Medicare Other | Admitting: Internal Medicine

## 2016-02-29 ENCOUNTER — Encounter: Payer: Self-pay | Admitting: Internal Medicine

## 2016-02-29 VITALS — BP 126/56 | HR 56 | Temp 97.3°F | Resp 16 | Ht 64.0 in | Wt 128.6 lb

## 2016-02-29 DIAGNOSIS — Z794 Long term (current) use of insulin: Secondary | ICD-10-CM | POA: Diagnosis not present

## 2016-02-29 DIAGNOSIS — E1040 Type 1 diabetes mellitus with diabetic neuropathy, unspecified: Secondary | ICD-10-CM | POA: Diagnosis not present

## 2016-02-29 DIAGNOSIS — N184 Chronic kidney disease, stage 4 (severe): Secondary | ICD-10-CM | POA: Diagnosis not present

## 2016-02-29 DIAGNOSIS — D62 Acute posthemorrhagic anemia: Secondary | ICD-10-CM | POA: Diagnosis not present

## 2016-02-29 DIAGNOSIS — Z79899 Other long term (current) drug therapy: Secondary | ICD-10-CM | POA: Diagnosis not present

## 2016-02-29 DIAGNOSIS — E1122 Type 2 diabetes mellitus with diabetic chronic kidney disease: Secondary | ICD-10-CM

## 2016-02-29 DIAGNOSIS — E1022 Type 1 diabetes mellitus with diabetic chronic kidney disease: Secondary | ICD-10-CM

## 2016-02-29 DIAGNOSIS — D519 Vitamin B12 deficiency anemia, unspecified: Secondary | ICD-10-CM

## 2016-02-29 LAB — CBC WITH DIFFERENTIAL/PLATELET
Basophils Absolute: 0 cells/uL (ref 0–200)
Basophils Relative: 0 %
Eosinophils Absolute: 420 cells/uL (ref 15–500)
Eosinophils Relative: 3 %
HCT: 34.4 % — ABNORMAL LOW (ref 35.0–45.0)
Hemoglobin: 11.1 g/dL — ABNORMAL LOW (ref 11.7–15.5)
Lymphocytes Relative: 28 %
Lymphs Abs: 3920 cells/uL — ABNORMAL HIGH (ref 850–3900)
MCH: 30.7 pg (ref 27.0–33.0)
MCHC: 32.3 g/dL (ref 32.0–36.0)
MCV: 95.3 fL (ref 80.0–100.0)
MPV: 11.6 fL (ref 7.5–12.5)
Monocytes Absolute: 1400 cells/uL — ABNORMAL HIGH (ref 200–950)
Monocytes Relative: 10 %
Neutro Abs: 8260 cells/uL — ABNORMAL HIGH (ref 1500–7800)
Neutrophils Relative %: 59 %
Platelets: 353 10*3/uL (ref 140–400)
RBC: 3.61 MIL/uL — ABNORMAL LOW (ref 3.80–5.10)
RDW: 13.1 % (ref 11.0–15.0)
WBC: 14 10*3/uL — ABNORMAL HIGH (ref 3.8–10.8)

## 2016-02-29 LAB — VITAMIN B12: Vitamin B-12: >2000 pg/mL — ABNORMAL HIGH (ref 200–1100)

## 2016-02-29 LAB — RETICULOCYTES
ABS Retic: 46930 cells/uL (ref 20000–80000)
RBC.: 3.61 MIL/uL — ABNORMAL LOW (ref 3.80–5.10)
Retic Ct Pct: 1.3 %

## 2016-02-29 NOTE — Progress Notes (Signed)
[facility]   [name], M.D.    [name]. Silverio Lay, P.A.-C      Starlyn Skeans, P.A.-C  [facility]                [address] [place] [address] Telephone [phone] Telefax [phone] Subjective:    Patient ID: [name], female    DOB: 1929/08/16, 82 y.o.   MRN: [id number]  HPI   This very nice 82 yo MWF with longstanding poorly controlled Ins Req T2_DM circa 1996 and who also has peripheral sensory neuropathy and CKD4 was seen recently with noted rise in A1c from 7.5% to 7.7% and she had 1/45 tab of Glipizide 5 mg (+2.5 mg) added with her insulin regimen and she reports she "thinks" that her CBG's have been better in the range <100, usu betw 80-90's and only "rarely" up as high as the 160's. She also was noted to have a drop in Hgb from 12.3 to 10.8 gm%. She did have Nl iron panel earlier this year.  Renal functions showed BUN rising from 35 -> 43, and Creat dropping from 2.03 to 1.80 and GFR was 25 stable from recent 22 ml/min. Patient reports feeling "well" overall.   Medication Sig  . aspirin 81 MG tablet Take 81 mg by mouth daily.  Marland Kitchen azelastine (ASTELIN) 0.1 % nasal spray Place 2 sprays into both nostrils 2 (two) times daily.   . bisoprolol-hctz (ZIAC) 5-6.25 MG tablet TAKE 1 TABLET BY MOUTH DAILY FOR HIGH BLOOD PRESSURE  . VITAMIN D 2000 UNITS TABS Take 1 capsule by mouth 2 (two) times daily.  . fenofibrate  134 MG capsule TAKE ONE CAPSULE BY MOUTH  DAILY FOR BLOOD FATS  . gabapentin (NEURONTIN) 600 MG tablet Take 1 tablet by mouth 4  times daily for pain  . glipiZIDE (GLUCOTROL) 5 MG tablet Take 1/2 tablet daily with your largest meal.    . losartan (COZAAR) 100 MG tablet TAKE 1 TABLET BY MOUTH  DAILY  . Magnesium 500 MG CAPS Take 500 mg by mouth 2 (two) times daily.  . meloxicam (MOBIC) 15 MG tablet Take 1/2 to 1 tablet daily  with food for pain and  inflammation  .  metFORMIN 1000 MG tablet TAKE ONE TABTWO TIMES A DAY AFTER MEALS  . HAIR/SKIN/NAILS Take by mouth 2 (two) times daily.  . pravastatin (PRAVACHOL) 40 MG tablet Take 1 tablet by mouth at  bedtime for cholesterol  . rOPINIRole (REQUIP) 3 MG tablet TAKE 1 TAB 2 x/day and 2 tabs at HS  . traMADol (ULTRAM) 50 MG tablet 4 x day or every 4 hours if needed for severe pain  . metoCLOPramide (REGLAN) 5 MG tablet Take 1 tab 3 times daily.   Allergies  Allergen Reactions  . Morphine And Related Other (See Comments)    fuzzy feeling   Past Medical History:  Diagnosis Date  . Arthritis   . Chronic back pain   . Diabetic neuropathy ([place])   . Hypercholesteremia   . Hypertension   . Liver lesion   . Neuropathy ([place])   . Type II or unspecified type diabetes mellitus without mention of complication, not stated as uncontrolled   . Vitamin D deficiency   . Wears glasses    Review of Systems   10 point systems review negative except as above.  Objective:   Physical Exam  BP (!) 126/56   Pulse (!) 56   Temp 97.3 F (36.3 C)   Resp 16   Ht 5\' 4"  (1.626 m)   Wt 128 lb 9.6 oz (58.3 kg)   BMI 22.07 kg/m   HEENT - Eac's patent. TM's Nl. EOM's full. PERRLA. NasoOroPharynx clear. Neck - supple. Nl Thyroid. Carotids 2+ & No bruits, nodes, JVD Chest - Clear equal BS w/o Rales, rhonchi, wheezes. Cor - Nl HS. RRR w/o sig MGR. PP 1(+). No edema. MS- FROM w/o deformities. Muscle power, tone and bulk Nl. Gait Nl. Neuro - DTR's flat thru-out.  Nl w/o focal abnormalities.    Assessment & Plan:   1. Ins Req T2_DM with CKD4 ([employer])  - long discussion about importance of better diet compliance in hopes of better control.   2. Diabetes mellitus with diabetic neuropathy ([facility])  - discussed talering Gabapentin 600 mg to only 1/2 tab 3-4 x/day due to sedation.   3. Type 2 diabetes mellitus with CKD4 , with long-term current use of insulin ([facility])  - BASIC METABOLIC PANEL WITH GFR  4. Anemia, r/o  - CBC  with Differential/Platelet - Folate RBC - Reticulocytes  5. Anemia r/o vitamin B12 deficiency  - CBC with Differential/Platelet - Vitamin B12 - Methylmalonic acid, serum - Reticulocytes  6. Medication management  - BASIC METABOLIC PANEL WITH GFR

## 2016-02-29 NOTE — Patient Instructions (Signed)
Type 1 Diabetes Mellitus, Adult Type 1 diabetes mellitus, often simply referred to as diabetes, is a long-term (chronic) disease. It occurs when the islet cells in the pancreas that make insulin (a hormone) are destroyed and can no longer make insulin. Insulin is needed to move sugars from food into the tissue cells. The tissue cells use the sugars for energy. In people with type 1 diabetes, the sugars build up in the blood instead of going into the tissue cells. As a result, high blood sugar (hyperglycemia) develops. Without insulin, the body breaks down fat cells for the needed energy. This breakdown of fat cells produces acid chemicals (ketones), which increases the acid levels in the body. The effect of either high ketone or high sugar (glucose) levels can be life-threatening.  Type 1 diabetes was also previously called juvenile diabetes. It most often occurs before the age of 35, but it can occur at any age. RISK FACTORS A person is predisposed to developing type 1 diabetes if someone in his or her family has the disease and is exposed to certain additional environmental triggers.  SYMPTOMS  Symptoms of type 1 diabetes may develop gradually over days to weeks or suddenly. The symptoms occur due to hyperglycemia. The symptoms can include:   Increased thirst (polydipsia).  Increased urination (polyuria).  Increased urination during the night (nocturia).  Weight loss. This weight loss may be rapid.  Frequent, recurring infections.  Tiredness (fatigue).  Weakness.  Vision changes, such as blurred vision.  Fruity smell to your breath.  Abdominal pain.  Nausea or vomiting.  An open skin wound (ulcer). DIAGNOSIS  Type 1 diabetes is diagnosed when symptoms of diabetes are present and when blood glucose levels are increased. Your blood glucose level may be checked by one or more of the following blood tests:  A fasting blood glucose test. You will not be allowed to eat for at least 8  hours before a blood sample is taken.  A random blood glucose test. Your blood glucose is checked at any time of the day regardless of when you ate.  A hemoglobin A1c blood glucose test. A hemoglobin A1c test provides information about blood glucose control over the previous 3 months. TREATMENT  Although type 1 diabetes cannot be prevented, it can be managed with insulin, diet, and exercise.  You will need to take insulin daily to keep blood glucose in the desired range.  You will need to match insulin dosing with exercise and healthy food choices. Generally, the goal of treatment is to maintain a pre-meal (preprandial) blood glucose level of 80-130 mg/dL. HOME CARE INSTRUCTIONS   Have your hemoglobin A1c level checked twice a year.  Perform daily blood glucose monitoring as directed by your health care provider.  Monitor urine ketones when you are ill and as directed by your health care provider.  Take your insulin as directed by your health care provider to maintain your blood glucose level in the desired range.  Never run out of insulin. It is needed every day.  Adjust insulin based on your intake of carbohydrates. Carbohydrates can raise blood glucose levels but need to be included in your diet. Carbohydrates provide vitamins, minerals, and fiber, which are an essential part of a healthy diet. Carbohydrates are found in fruits, vegetables, whole grains, dairy products, legumes, and foods containing added sugars.  Eat healthy foods. Alternate 3 meals with 3 snacks.  Maintain a healthy weight.  Carry a medical alert card or wear your medical alert  jewelry.  Carry a 15-gram carbohydrate snack with you at all times to treat low blood glucose (hypoglycemia). Some examples of 15-gram carbohydrate snacks include:  Glucose tablets, 3 or 4.  Glucose gel, 15-gram tube.  Raisins, 2 tablespoons (24 grams).  Jelly beans, 6.  Animal crackers, 8.  Fruit juice, regular soda, or  low-fat milk, 4 ounces (120 mL).  Gummy treats, 9.  Recognize hypoglycemia. Hypoglycemia occurs with blood glucose levels of 70 mg/dL and below. The risk for hypoglycemia increases when fasting or skipping meals, during or after intense exercise, and during sleep. Hypoglycemia symptoms can include:  Tremors or shakes.  Decreased ability to concentrate.  Sweating.  Increased heart rate.  Headache.  Dry mouth.  Hunger.  Irritability.  Anxiety.  Restless sleep.  Altered speech or coordination.  Confusion.  Treat hypoglycemia promptly. If you are alert and able to safely swallow, follow the 15:15 rule:  Take 15-20 grams of rapid-acting glucose or carbohydrate. Rapid-acting options include glucose gel, glucose tablets, or 4 ounces (120 mL) of fruit juice, regular soda, or low-fat milk.  Check your blood glucose level 15 minutes after taking the glucose.  Take 15-20 grams more of glucose if the repeat blood glucose level is still 70 mg/dL or below.  Eat a meal or snack within 1 hour once blood glucose levels return to normal.  Be alert to polyuria and polydipsia, which are early signs of hyperglycemia. An early awareness of hyperglycemia allows for prompt treatment. Treat hyperglycemia as directed by your health care provider.  Exercise regularly as directed by your health care provider. This includes:  Stretching and performing strength training exercises, such as yoga or weight lifting, at least 2 times per week.  Performing a total of at least 150 minutes of moderate-intensity exercise each week, such as brisk walking or water aerobics.  Exercising at least 3 days per week, making sure you allow no more than 2 consecutive days to pass without exercising.  Avoiding long periods of inactivity (90 minutes or more). When you have to spend an extended period of time sitting down, take frequent breaks to walk or stretch.  Adjust your insulin dosing and food intake as needed  if you start a new exercise or sport.  Follow your sick-day plan at any time you are unable to eat or drink as usual.   Do not use any tobacco products including cigarettes, chewing tobacco, or electronic cigarettes. If you need help quitting, ask your health care provider.  Limit alcohol intake to no more than 1 drink per day for nonpregnant women and 2 drinks per day for men. You should drink alcohol only when you are also eating food. Talk with your health care provider about whether alcohol is safe for you. Tell your health care provider if you drink alcohol several times a week.  Keep all follow-up visits as directed by your health care provider.  Schedule an eye exam within 5 years of diagnosis and then annually.  Perform daily skin and foot care. Examine your skin and feet daily for cuts, bruises, redness, nail problems, bleeding, blisters, or sores. A foot exam should be done by a health care provider 5 years after diagnosis, and then every year after the first exam.  Brush your teeth and gums at least twice a day and floss at least once a day. Follow up with your dentist regularly.  Share your diabetes management plan with your workplace or school.  Keep your immunizations up to date. It  is recommended that you receive a flu (influenza) vaccine every year. It is also recommended that you receive a pneumonia (pneumococcal) vaccine. If you are 12 years of age or older and have never received a pneumonia vaccine, this vaccine may be given as a series of two separate shots. Ask your health care provider which additional vaccines may be recommended.  Learn to manage stress.  Obtain ongoing diabetes education and support as needed.  Participate in or seek rehabilitation as needed to maintain or improve independence and quality of life. Request a physical or occupational therapy referral if you are having foot or hand numbness, or difficulties with grooming, dressing, eating, or physical  activity. SEEK MEDICAL CARE IF:   You are unable to eat food or drink fluids for more than 6 hours.  You have nausea and vomiting for more than 6 hours.  Your blood glucose level is over 240 mg/dL.  There is a change in mental status.  You develop an additional serious illness.  You have diarrhea for more than 6 hours.  You have been sick or have had a fever for a couple of days and are not getting better.  You have pain during any physical activity. SEEK IMMEDIATE MEDICAL CARE IF:  You have difficulty breathing.  You have moderate to large ketone levels. MAKE SURE YOU:  Understand these instructions.  Will watch your condition.  Will get help right away if you are not doing well or get worse.   This information is not intended to replace advice given to you by your health care provider. Make sure you discuss any questions you have with your health care provider.   Document Released: 04/08/2000 Document Revised: 12/31/2014 Document Reviewed: 11/08/2011 Elsevier Interactive Patient Education [employer].

## 2016-03-01 ENCOUNTER — Telehealth: Payer: Self-pay | Admitting: *Deleted

## 2016-03-01 ENCOUNTER — Other Ambulatory Visit: Payer: Self-pay | Admitting: Internal Medicine

## 2016-03-01 DIAGNOSIS — IMO0002 Reserved for concepts with insufficient information to code with codable children: Secondary | ICD-10-CM

## 2016-03-01 DIAGNOSIS — E1065 Type 1 diabetes mellitus with hyperglycemia: Secondary | ICD-10-CM

## 2016-03-01 DIAGNOSIS — N184 Chronic kidney disease, stage 4 (severe): Principal | ICD-10-CM

## 2016-03-01 DIAGNOSIS — E1022 Type 1 diabetes mellitus with diabetic chronic kidney disease: Secondary | ICD-10-CM

## 2016-03-01 LAB — BASIC METABOLIC PANEL WITH GFR
BUN: 31 mg/dL — ABNORMAL HIGH (ref 7–25)
CO2: 26 mmol/L (ref 20–31)
Calcium: 10 mg/dL (ref 8.6–10.4)
Chloride: 104 mmol/L (ref 98–110)
Creat: 1.94 mg/dL — ABNORMAL HIGH (ref 0.60–0.88)
GFR, Est African American: 27 mL/min — ABNORMAL LOW (ref >=60)
GFR, Est Non African American: 23 mL/min — ABNORMAL LOW (ref >=60)
Glucose, Bld: 58 mg/dL — ABNORMAL LOW (ref 65–99)
Potassium: 4.5 mmol/L (ref 3.5–5.3)
Sodium: 140 mmol/L (ref 135–146)

## 2016-03-01 LAB — FOLATE RBC: RBC Folate: 659 ng/mL (ref >280)

## 2016-03-01 NOTE — Telephone Encounter (Signed)
Spoke with patient regarding her lab results.  She states she is having pain in her right neck, right shoulder and moving toward her right ear.  Per Dr Melford Aase, apply heat to the area and take Tylenol.  Patient was advised to avoid Meloxicam, Tramadol and products such as Aleve and Advil.

## 2016-03-02 LAB — METHYLMALONIC ACID, SERUM: Methylmalonic Acid, Quant: 228 nmol/L (ref 87–318)

## 2016-03-09 ENCOUNTER — Other Ambulatory Visit: Payer: Self-pay | Admitting: Internal Medicine

## 2016-03-09 DIAGNOSIS — E782 Mixed hyperlipidemia: Secondary | ICD-10-CM

## 2016-03-09 MED ORDER — FENOFIBRATE MICRONIZED 134 MG PO CAPS: ORAL_CAPSULE | ORAL | 3 refills | Status: DC

## 2016-03-16 DIAGNOSIS — R319 Hematuria, unspecified: Secondary | ICD-10-CM | POA: Diagnosis not present

## 2016-03-16 DIAGNOSIS — N183 Chronic kidney disease, stage 3 (moderate): Secondary | ICD-10-CM | POA: Diagnosis not present

## 2016-03-16 DIAGNOSIS — E1129 Type 2 diabetes mellitus with other diabetic kidney complication: Secondary | ICD-10-CM | POA: Diagnosis not present

## 2016-03-16 DIAGNOSIS — I129 Hypertensive chronic kidney disease with stage 1 through stage 4 chronic kidney disease, or unspecified chronic kidney disease: Secondary | ICD-10-CM | POA: Diagnosis not present

## 2016-03-23 ENCOUNTER — Ambulatory Visit
Admission: RE | Admit: 2016-03-23 | Discharge: 2016-03-23 | Disposition: A | Discharge status: Home / Self Care | Payer: Medicare Other | Source: Ambulatory Visit | Attending: Internal Medicine | Admitting: Internal Medicine

## 2016-03-23 DIAGNOSIS — Z1231 Encounter for screening mammogram for malignant neoplasm of breast: Secondary | ICD-10-CM | POA: Diagnosis not present

## 2016-03-29 DIAGNOSIS — M461 Sacroiliitis, not elsewhere classified: Secondary | ICD-10-CM | POA: Diagnosis not present

## 2016-03-30 DIAGNOSIS — H353211 Exudative age-related macular degeneration, right eye, with active choroidal neovascularization: Secondary | ICD-10-CM | POA: Diagnosis not present

## 2016-04-05 DIAGNOSIS — H353221 Exudative age-related macular degeneration, left eye, with active choroidal neovascularization: Secondary | ICD-10-CM | POA: Diagnosis not present

## 2016-04-08 DIAGNOSIS — H401111 Primary open-angle glaucoma, right eye, mild stage: Secondary | ICD-10-CM | POA: Diagnosis not present

## 2016-04-08 DIAGNOSIS — H401122 Primary open-angle glaucoma, left eye, moderate stage: Secondary | ICD-10-CM | POA: Diagnosis not present

## 2016-04-13 ENCOUNTER — Other Ambulatory Visit: Payer: Self-pay | Admitting: Physician Assistant

## 2016-04-14 DIAGNOSIS — H353211 Exudative age-related macular degeneration, right eye, with active choroidal neovascularization: Secondary | ICD-10-CM | POA: Diagnosis not present

## 2016-04-14 DIAGNOSIS — H3561 Retinal hemorrhage, right eye: Secondary | ICD-10-CM | POA: Diagnosis not present

## 2016-04-14 DIAGNOSIS — H353133 Nonexudative age-related macular degeneration, bilateral, advanced atrophic without subfoveal involvement: Secondary | ICD-10-CM | POA: Diagnosis not present

## 2016-04-14 DIAGNOSIS — H18232 Secondary corneal edema, left eye: Secondary | ICD-10-CM | POA: Diagnosis not present

## 2016-04-14 DIAGNOSIS — H353221 Exudative age-related macular degeneration, left eye, with active choroidal neovascularization: Secondary | ICD-10-CM | POA: Diagnosis not present

## 2016-04-21 DIAGNOSIS — H26492 Other secondary cataract, left eye: Secondary | ICD-10-CM | POA: Diagnosis not present

## 2016-04-21 DIAGNOSIS — H18232 Secondary corneal edema, left eye: Secondary | ICD-10-CM | POA: Diagnosis not present

## 2016-04-21 DIAGNOSIS — H353221 Exudative age-related macular degeneration, left eye, with active choroidal neovascularization: Secondary | ICD-10-CM | POA: Diagnosis not present

## 2016-04-21 DIAGNOSIS — H353211 Exudative age-related macular degeneration, right eye, with active choroidal neovascularization: Secondary | ICD-10-CM | POA: Diagnosis not present

## 2016-04-22 DIAGNOSIS — Z961 Presence of intraocular lens: Secondary | ICD-10-CM | POA: Diagnosis not present

## 2016-04-22 DIAGNOSIS — H26492 Other secondary cataract, left eye: Secondary | ICD-10-CM | POA: Diagnosis not present

## 2016-05-03 ENCOUNTER — Other Ambulatory Visit: Payer: Self-pay | Admitting: Physician Assistant

## 2016-05-03 ENCOUNTER — Ambulatory Visit (INDEPENDENT_AMBULATORY_CARE_PROVIDER_SITE_OTHER): Payer: Medicare Other | Admitting: Internal Medicine

## 2016-05-03 ENCOUNTER — Other Ambulatory Visit: Payer: Self-pay | Admitting: Internal Medicine

## 2016-05-03 ENCOUNTER — Encounter: Payer: Self-pay | Admitting: Internal Medicine

## 2016-05-03 VITALS — BP 164/70 | HR 52 | Temp 97.5°F | Resp 16 | Ht 64.0 in | Wt 125.8 lb

## 2016-05-03 DIAGNOSIS — E1122 Type 2 diabetes mellitus with diabetic chronic kidney disease: Secondary | ICD-10-CM | POA: Diagnosis not present

## 2016-05-03 DIAGNOSIS — R6889 Other general symptoms and signs: Secondary | ICD-10-CM | POA: Diagnosis not present

## 2016-05-03 DIAGNOSIS — E1040 Type 1 diabetes mellitus with diabetic neuropathy, unspecified: Secondary | ICD-10-CM | POA: Diagnosis not present

## 2016-05-03 DIAGNOSIS — Z136 Encounter for screening for cardiovascular disorders: Secondary | ICD-10-CM

## 2016-05-03 DIAGNOSIS — Z79899 Other long term (current) drug therapy: Secondary | ICD-10-CM

## 2016-05-03 DIAGNOSIS — E559 Vitamin D deficiency, unspecified: Secondary | ICD-10-CM | POA: Diagnosis not present

## 2016-05-03 DIAGNOSIS — E11319 Type 2 diabetes mellitus with unspecified diabetic retinopathy without macular edema: Secondary | ICD-10-CM | POA: Diagnosis not present

## 2016-05-03 DIAGNOSIS — Z794 Long term (current) use of insulin: Secondary | ICD-10-CM

## 2016-05-03 DIAGNOSIS — I10 Essential (primary) hypertension: Secondary | ICD-10-CM

## 2016-05-03 DIAGNOSIS — Z1212 Encounter for screening for malignant neoplasm of rectum: Secondary | ICD-10-CM

## 2016-05-03 DIAGNOSIS — E782 Mixed hyperlipidemia: Secondary | ICD-10-CM

## 2016-05-03 DIAGNOSIS — N184 Chronic kidney disease, stage 4 (severe): Secondary | ICD-10-CM

## 2016-05-03 DIAGNOSIS — Z0001 Encounter for general adult medical examination with abnormal findings: Secondary | ICD-10-CM | POA: Diagnosis not present

## 2016-05-03 LAB — HEMOGLOBIN A1C
Hgb A1c MFr Bld: 10.5 % — ABNORMAL HIGH (ref <5.7)
Mean Plasma Glucose: 255 mg/dL

## 2016-05-03 LAB — CBC WITH DIFFERENTIAL/PLATELET
Basophils Absolute: 83 cells/uL (ref 0–200)
Basophils Relative: 1 %
Eosinophils Absolute: 166 cells/uL (ref 15–500)
Eosinophils Relative: 2 %
HCT: 38.7 % (ref 35.0–45.0)
Hemoglobin: 12.4 g/dL (ref 11.7–15.5)
Lymphocytes Relative: 26 %
Lymphs Abs: 2158 cells/uL (ref 850–3900)
MCH: 30.8 pg (ref 27.0–33.0)
MCHC: 32 g/dL (ref 32.0–36.0)
MCV: 96.3 fL (ref 80.0–100.0)
MPV: 12.3 fL (ref 7.5–12.5)
Monocytes Absolute: 747 cells/uL (ref 200–950)
Monocytes Relative: 9 %
Neutro Abs: 5146 cells/uL (ref 1500–7800)
Neutrophils Relative %: 62 %
Platelets: 279 10*3/uL (ref 140–400)
RBC: 4.02 MIL/uL (ref 3.80–5.10)
RDW: 12.4 % (ref 11.0–15.0)
WBC: 8.3 10*3/uL (ref 3.8–10.8)

## 2016-05-03 LAB — BASIC METABOLIC PANEL WITH GFR
BUN: 36 mg/dL — ABNORMAL HIGH (ref 7–25)
CO2: 30 mmol/L (ref 20–31)
Calcium: 9.6 mg/dL (ref 8.6–10.4)
Chloride: 95 mmol/L — ABNORMAL LOW (ref 98–110)
Creat: 1.67 mg/dL — ABNORMAL HIGH (ref 0.60–0.88)
GFR, Est African American: 32 mL/min — ABNORMAL LOW (ref >=60)
GFR, Est Non African American: 28 mL/min — ABNORMAL LOW (ref >=60)
Glucose, Bld: 440 mg/dL — ABNORMAL HIGH (ref 65–99)
Potassium: 4.6 mmol/L (ref 3.5–5.3)
Sodium: 133 mmol/L — ABNORMAL LOW (ref 135–146)

## 2016-05-03 LAB — HEPATIC FUNCTION PANEL
ALT: 10 U/L (ref 6–29)
AST: 17 U/L (ref 10–35)
Albumin: 3.8 g/dL (ref 3.6–5.1)
Alkaline Phosphatase: 57 U/L (ref 33–130)
Bilirubin, Direct: 0.1 mg/dL (ref <=0.2)
Indirect Bilirubin: 0.3 mg/dL (ref 0.2–1.2)
Total Bilirubin: 0.4 mg/dL (ref 0.2–1.2)
Total Protein: 7.3 g/dL (ref 6.1–8.1)

## 2016-05-03 LAB — LIPID PANEL
Cholesterol: 170 mg/dL (ref <200)
HDL: 50 mg/dL — ABNORMAL LOW (ref >50)
LDL Cholesterol: 85 mg/dL (ref <100)
Total CHOL/HDL Ratio: 3.4 Ratio (ref <5.0)
Triglycerides: 174 mg/dL — ABNORMAL HIGH (ref <150)
VLDL: 35 mg/dL — ABNORMAL HIGH (ref <30)

## 2016-05-03 LAB — MAGNESIUM: Magnesium: 2 mg/dL (ref 1.5–2.5)

## 2016-05-03 LAB — TSH: TSH: 0.46 mIU/L

## 2016-05-03 NOTE — Patient Instructions (Signed)

## 2016-05-03 NOTE — Progress Notes (Signed)
[facility] [name], M.D.    [name]. [name], P.A.-C      [name], P.A.-C  [facility]                [address] [place], [place] [address] Telephone [phone] Telefax [phone]  Annual Screening/Preventative Visit & Comprehensive Evaluation &  Examination     This very nice 83 y.o. MWF presents for a Screening/Preventative Visit & comprehensive evaluation and management of multiple medical co-morbidities.  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      HTN predates since 2008 . [name] BP has been controlled at home and patient denies any cardiac symptoms as chest pain, palpitations, shortness of breath, dizziness or ankle swelling. Today's BP is elevated at 164/70 as apparently she has stopped taking her Losartan!     [name] hyperlipidemia is controlled with diet and medications. Patient denies myalgias or other medication SE's. Last lipids were at goal: Lab Results  Component Value Date   CHOL 127 01/26/2016   HDL 47 01/26/2016   LDLCALC 57 01/26/2016   TRIG 115 01/26/2016   CHOLHDL 2.7 01/26/2016      Patient has Insulin requiring  T2_DM w/CKD4 (GFR 23 ml/mkin) predating since starting oral agents in 1996 and subsequently adding Insulins in 2000. Patient denies reactive hypoglycemic symptoms, visual blurring ordiabetic polys. She does have hx/o Peripheral Sensory Neuropathy, but today denies any pain or dysthesias of her LE's. Patient has been on Metformin 1000 mg bid and Lantus 18 u qhs and has a Rx for Glipizide prn largest meal which she has not been taking.   Patient seems to have very limited insight into her diabetic management. She reports CBG's have ranged from 90's to 200's.  She is also followed by Dr Lorrene Reid for her CKD. She is followed by Dr Katy Fitch and also by Dr Zadie Rhine for Avastin  Intraocular  injections. .  Last A1c was not at goal: Lab Results  Component Value Date   HGBA1C 7.7 (H) 01/26/2016      Finally, patient has history of Vitamin D Deficiency and last Vitamin D was near goal (goal 70-100):  Lab Results  Component Value Date   VD25OH 50 10/22/2015   Current Outpatient Prescriptions on File Prior to Visit  Medication Sig  . ACCU-CHEK AVIVA PLUS test strip Test 3 times a day  . aspirin 81 MG tablet Take 81 mg by mouth daily.  . ASSURE COMFORT LANCETS 30G MISC Check blood sugar 3 times daily-DX-E11.22  . azelastine (ASTELIN) 0.1 % nasal spray Place 2 sprays into both nostrils 2 (two) times daily. Use in each nostril as directed  . Blood Glucose Calibration (ACCU-CHEK AVIVA) SOLN Check blood sugar 3 times daily-DX-E11.22  . Cholecalciferol (VITAMIN D3) 2000 UNITS TABS Take 1 capsule by mouth 2 (two) times daily.  Marland Kitchen EASY COMFORT INSULIN SYRINGE 30G X 1/2" 0.5 ML MISC   . fenofibrate micronized (LOFIBRA) 134 MG capsule TAKE ONE CAPSULE BY MOUTH  DAILY FOR BLOOD FATS  . gabapentin (NEURONTIN) 600 MG tablet Take 1 tablet by mouth 4  times daily for pain  . glipiZIDE (GLUCOTROL) 5 MG tablet Take 1/2 tablet daily with your largest meal.  Do not take this medication if you have not eaten. (Patient not taking: Reported on 05/03/2016)  . losartan (COZAAR) 100  MG tablet TAKE 1 TABLET BY MOUTH  DAILY FOR HIGH BLOOD  PRESSURE, HEART, AND  KIDNEYS (Patient not taking: Reported on 05/03/2016)  . Magnesium 500 MG CAPS Take 500 mg by mouth 2 (two) times daily.  . Multiple Vitamins-Minerals (HAIR/SKIN/NAILS) TABS Take by mouth 2 (two) times daily.  . pravastatin (PRAVACHOL) 40 MG tablet TAKE 1 TABLET BY MOUTH AT BEDTIME FOR CHOLESTEROL.   No current facility-administered medications on file prior to visit.    Allergies  Allergen Reactions  . Morphine And Related Other (See Comments)    fuzzy feeling   Past Medical History:  Diagnosis Date  . Arthritis   . Chronic back pain   .  Diabetic neuropathy ([place])   . Hypercholesteremia   . Hypertension   . Liver lesion   . Neuropathy ([place])   . Type II or unspecified type diabetes mellitus without mention of complication, not stated as uncontrolled   . Vitamin D deficiency   . Wears glasses    Health Maintenance  Topic Date Due  . ZOSTAVAX  01/22/1992  . OPHTHALMOLOGY EXAM  12/16/2015  . HEMOGLOBIN A1C  07/26/2016  . FOOT EXAM  10/21/2016  . TETANUS/TDAP  01/03/2021  . INFLUENZA VACCINE  Completed  . DEXA SCAN  Completed  . PNA vac Low Risk Adult  Completed   Immunization History  Administered Date(s) Administered  . DT 01/04/2011  . Influenza Split 01/23/2013  . Influenza, High Dose Seasonal PF 02/27/2014, 03/11/2015, 12/21/2015  . Pneumococcal Conjugate-13 02/27/2014  . Pneumococcal Polysaccharide-23 01/06/2012   Past Surgical History:  Procedure Laterality Date  . ABDOMINAL HYSTERECTOMY    . APPENDECTOMY    . BACK SURGERY  1993   lumb lam-fusion  . CESAREAN SECTION     x 3   . COLONOSCOPY    . EYE SURGERY     both cataracts  . TRIGGER FINGER RELEASE  01/10/2012   Procedure: RELEASE TRIGGER FINGER/A-1 PULLEY;  Surgeon: Hessie Dibble, MD;  Location: [facility];  Service: Orthopedics;  Laterality: Right;  right ring trigger release   Family History  Problem Relation Age of Onset  . Colon cancer Neg Hx   . Liver disease Sister   . Diabetes Sister    Social History  Substance Use Topics  . Smoking status: Never Smoker  . Smokeless tobacco: Never Used  . Alcohol use No    ROS Constitutional: Denies fever, chills, weight loss/gain, headaches, insomnia,  night sweats, and change in appetite. Does c/o fatigue. Eyes: Denies redness, blurred vision, diplopia, discharge, itchy, watery eyes.  ENT: Denies discharge, congestion, post nasal drip, epistaxis, sore throat, earache, hearing loss, dental pain, Tinnitus, Vertigo, Sinus pain, snoring.  Cardio: Denies chest pain, palpitations,  irregular heartbeat, syncope, dyspnea, diaphoresis, orthopnea, PND, claudication, edema Respiratory: denies cough, dyspnea, DOE, pleurisy, hoarseness, laryngitis, wheezing.  Gastrointestinal: Denies dysphagia, heartburn, reflux, water brash, pain, cramps, nausea, vomiting, bloating, diarrhea, constipation, hematemesis, melena, hematochezia, jaundice, hemorrhoids Genitourinary: Denies dysuria, frequency, urgency, nocturia, hesitancy, discharge, hematuria, flank pain Breast: Breast lumps, nipple discharge, bleeding.  Musculoskeletal: Denies arthralgia, myalgia, stiffness, Jt. Swelling, pain, limp, and strain/sprain. Denies falls. Skin: Denies puritis, rash, hives, warts, acne, eczema, changing in skin lesion Neuro: No weakness, tremor, incoordination, spasms, paresthesia, pain Psychiatric: Denies confusion, memory loss, sensory loss. Denies Depression. Endocrine: Denies change in weight, skin, hair change, nocturia, and paresthesia, diabetic polys, visual blurring, hyper / hypo glycemic episodes.  Heme/Lymph: No excessive bleeding, bruising, enlarged lymph nodes.  Physical  Exam  BP (!) 164/70   Pulse (!) 52   Temp 97.5 F (36.4 C)   Resp 16   Ht 5\' 4"  (1.626 m)   Wt 125 lb 12.8 oz (57.1 kg)   BMI 21.59 kg/m   General Appearance: Well nourished and in no apparent distress.  Eyes: PERRLA, EOMs, conjunctiva no swelling or erythema. Mild lenticular clouding ou.  Fundi with scattered dot & blot H & E and retinal scars.  Sinuses: No frontal/maxillary tenderness ENT/Mouth: EACs patent / TMs  nl. Nares clear without erythema, swelling, mucoid exudates. Oral hygiene is good. No erythema, swelling, or exudate. Tongue normal, non-obstructing. Tonsils not swollen or erythematous. Hearing normal.  Neck: Supple, thyroid normal. No bruits, nodes or JVD. Respiratory: Respiratory effort normal.  BS equal and clear bilateral without rales, rhonci, wheezing or stridor. Cardio: Heart sounds are normal with  regular rate and rhythm and no murmurs, rubs or gallops. Peripheral pulses are normal and equal bilaterally without edema. No aortic or femoral bruits. Chest: symmetric with normal excursions and percussion. Breasts: Symmetric, without lumps, nipple discharge, retractions, or fibrocystic changes.  Abdomen: Flat, soft with bowel sounds active. Nontender, no guarding, rebound, hernias, masses, or organomegaly.  Lymphatics: Non tender without lymphadenopathy.  Musculoskeletal: Full ROM all peripheral extremities, joint stability, 5/5 strength, and normal gait. Skin: Warm and dry without rashes, lesions, cyanosis, clubbing or  ecchymosis.  Neuro: Cranial nerves intact. DTR's hyporeflexic thru-out.  Normal muscle tone, no cerebellar symptoms. Sensation intact to touch and sl decreased to vibratory and Monofilament in a stocking distribution.  Pysch: Alert and oriented X 3, normal affect, Insight and Judgment limited.   Assessment and Plan  1. Annual Preventative Screening Examination  2. Essential hypertension  - recommended restart her Losartan and call if BP's remain elevated. - Microalbumin / creatinine urine ratio - EKG 12-Lead - CBC with Differential/Platelet - BASIC METABOLIC PANEL WITH GFR - TSH  3. Mixed hyperlipidemia  - EKG 12-Lead - Hepatic function panel - Lipid panel - TSH  4. Type 2 DM with stage 4 CKD, with long-term current use of insulin (HCC)  - Microalbumin / creatinine urine ratio - EKG 12-Lead - Hemoglobin A1c  5. Vitamin D deficiency  - VITAMIN D 25 Hydroxy   6. Type 1 diabetes mellitus with diabetic neuropathy (HCC)  - HM DIABETES FOOT EXAM - LOW EXTREMITY NEUR EXAM DOCUM - TSH - Hemoglobin A1c  7. Diabetic retinopathy associated with type 2 diabetes mellitus  (HCC)  - TSH - Hemoglobin A1c  8. Screening for rectal cancer  - POC Hemoccult Bld/Stl  9. Screening for ischemic heart disease  - EKG 12-Lead  10. Medication management   -  Urinalysis, Routine w reflex microscopic - CBC with Differential/Platelet - BASIC METABOLIC PANEL WITH GFR - Hepatic function panel - Magnesium       Continue prudent diet as discussed, weight control, BP monitoring, regular exercise, and medications. Discussed med's effects and SE's. Screening labs and tests as requested with regular follow-up as recommended. Over 40 minutes of exam, counseling, chart review and high complex critical decision making was performed.

## 2016-05-04 LAB — URINALYSIS, ROUTINE W REFLEX MICROSCOPIC
Bilirubin Urine: NEGATIVE
Hgb urine dipstick: NEGATIVE
Ketones, ur: NEGATIVE
Nitrite: NEGATIVE
Protein, ur: NEGATIVE
Specific Gravity, Urine: 1.023 (ref 1.001–1.035)
pH: 7 (ref 5.0–8.0)

## 2016-05-04 LAB — URINALYSIS, MICROSCOPIC ONLY
Bacteria, UA: NONE SEEN [HPF]
Casts: NONE SEEN [LPF]
Crystals: NONE SEEN [HPF]
RBC / HPF: NONE SEEN RBC/HPF (ref <=2)
Squamous Epithelial / LPF: NONE SEEN [HPF] (ref <=5)
Yeast: NONE SEEN [HPF]

## 2016-05-04 LAB — MICROALBUMIN / CREATININE URINE RATIO
Creatinine, Urine: 42 mg/dL (ref 20–320)
Microalb Creat Ratio: 5 mcg/mg creat (ref <30)
Microalb, Ur: 0.2 mg/dL

## 2016-05-04 LAB — VITAMIN D 25 HYDROXY (VIT D DEFICIENCY, FRACTURES): Vit D, 25-Hydroxy: 81 ng/mL (ref 30–100)

## 2016-06-02 DIAGNOSIS — N183 Chronic kidney disease, stage 3 (moderate): Secondary | ICD-10-CM | POA: Diagnosis not present

## 2016-06-02 DIAGNOSIS — I129 Hypertensive chronic kidney disease with stage 1 through stage 4 chronic kidney disease, or unspecified chronic kidney disease: Secondary | ICD-10-CM | POA: Diagnosis not present

## 2016-06-02 DIAGNOSIS — R319 Hematuria, unspecified: Secondary | ICD-10-CM | POA: Diagnosis not present

## 2016-06-02 DIAGNOSIS — E1129 Type 2 diabetes mellitus with other diabetic kidney complication: Secondary | ICD-10-CM | POA: Diagnosis not present

## 2016-06-06 DIAGNOSIS — M7062 Trochanteric bursitis, left hip: Secondary | ICD-10-CM | POA: Diagnosis not present

## 2016-06-27 ENCOUNTER — Other Ambulatory Visit: Payer: Self-pay | Admitting: Physician Assistant

## 2016-06-28 DIAGNOSIS — H353211 Exudative age-related macular degeneration, right eye, with active choroidal neovascularization: Secondary | ICD-10-CM | POA: Diagnosis not present

## 2016-06-30 ENCOUNTER — Ambulatory Visit (INDEPENDENT_AMBULATORY_CARE_PROVIDER_SITE_OTHER): Payer: Medicare Other | Admitting: Physician Assistant

## 2016-06-30 ENCOUNTER — Encounter: Payer: Self-pay | Admitting: Physician Assistant

## 2016-06-30 VITALS — BP 116/60 | HR 60 | Temp 97.3°F | Resp 14 | Ht 62.0 in | Wt 127.0 lb

## 2016-06-30 DIAGNOSIS — M7070 Other bursitis of hip, unspecified hip: Secondary | ICD-10-CM

## 2016-06-30 DIAGNOSIS — J069 Acute upper respiratory infection, unspecified: Secondary | ICD-10-CM

## 2016-06-30 DIAGNOSIS — R110 Nausea: Secondary | ICD-10-CM

## 2016-06-30 MED ORDER — DEXAMETHASONE SODIUM PHOSPHATE 100 MG/10ML IJ SOLN
10.0000 mg | Freq: Once | INTRAMUSCULAR | Status: AC
  Administered 2016-06-30: 10 mg via INTRAMUSCULAR

## 2016-06-30 MED ORDER — AZITHROMYCIN 250 MG PO TABS: ORAL_TABLET | ORAL | 1 refills | Status: AC

## 2016-06-30 NOTE — Patient Instructions (Signed)
Please pick one of the over the counter allergy medications below and take it once daily for allergies.  Claritin or loratadine cheapest but likely the weakest  Zyrtec or certizine at night because it can make you sleepy The strongest is allegra or fexafinadine  Cheapest at [employer], [employer], [employer]  HOW TO TREAT VIRAL COUGH AND COLD SYMPTOMS:  -Symptoms usually last at least 1 week with the worst symptoms being around day 4.  - colds usually start with a sore throat and end with a cough, and the cough can take 2 weeks to get better.  -No antibiotics are needed for colds, flu, sore throats, cough, bronchitis UNLESS symptoms are longer than 7 days OR if you are getting better then get drastically worse.  -There are a lot of combination medications (Dayquil, Nyquil, Vicks 44, tyelnol cold and sinus, ETC). Please look at the ingredients on the back so that you are treating the correct symptoms and not doubling up on medications/ingredients.    Medicines you can use  Nasal congestion  - pseudoephedrine (Sudafed)- behind the counter, do not use if you have high blood pressure, medicine that have -D in them.  - phenylephrine (Sudafed PE) -Dextormethorphan + chlorpheniramine (Coridcidin HBP)- okay if you have high blood pressure -Oxymetazoline (Afrin) nasal spray- LIMIT to 3 days -Saline nasal spray -Neti pot (used distilled or bottled water)  Ear pain/congestion  -pseudoephedrine (sudafed) - Nasonex/flonase nasal spray  Fever  -Acetaminophen (Tyelnol) -Ibuprofen (Advil, motrin, aleve)  Sore Throat  -Acetaminophen (Tyelnol) -Ibuprofen (Advil, motrin, aleve) -Drink a lot of water -Gargle with salt water - Rest your voice (don't talk) -Throat sprays -Cough drops  Body Aches  -Acetaminophen (Tyelnol) -Ibuprofen (Advil, motrin, aleve)  Headache  -Acetaminophen (Tyelnol) -Ibuprofen (Advil, motrin, aleve) - Exedrin, Exedrin Migraine  Allergy symptoms (cough, sneeze, runny nose, itchy  eyes) -Claritin or loratadine cheapest but likely the weakest  -Zyrtec or certizine at night because it can make you sleepy -The strongest is allegra or fexafinadine  Cheapest at [employer], [employer], [employer]  Cough  -Dextromethorphan (Delsym)- medicine that has DM in it -Guafenesin (Mucinex/Robitussin) - cough drops - drink lots of water  Chest Congestion  -Guafenesin (Mucinex/Robitussin)  Red Itchy Eyes  - Naphcon-A  Upset Stomach  - Bland diet (nothing spicy, greasy, fried, and high acid foods like tomatoes, oranges, berries) -OKAY- cereal, bread, soup, crackers, rice -Eat smaller more frequent meals -reduce caffeine, no alcohol -Loperamide (Imodium-AD) if diarrhea -Prevacid for heart burn  General health when sick  -Hydration -wash your hands frequently -keep surfaces clean -change pillow cases and sheets often -Get fresh air but do not exercise strenuously -Vitamin D, double up on it - Vitamin C -Zinc

## 2016-06-30 NOTE — Progress Notes (Signed)
Subjective:    Patient ID: [name], female    DOB: [birth year], 83 y.o.   MRN: [id number]  HPI 83 y.o. WF with history of DM2, chol, chronic pain presents with fatigue. She states that she has had fatigue, decreased appetite, sneezing, some mild SOB, cough, states it feels like she did last year when she was diagnosed with pneumonia.   Blood pressure 116/60, pulse 60, temperature 97.3 F (36.3 C), resp. rate 14, height 5\' 2"  (1.575 m), weight 127 lb (57.6 kg), SpO2 98 %.  Medications Current Outpatient Prescriptions on File Prior to Visit  Medication Sig  . ACCU-CHEK AVIVA PLUS test strip Test 3 times a day  . aspirin 81 MG tablet Take 81 mg by mouth daily.  . ASSURE COMFORT LANCETS 30G MISC Check blood sugar 3 times daily-DX-E11.22  . bisoprolol-hydrochlorothiazide (ZIAC) 5-6.25 MG tablet TAKE 1 TABLET BY MOUTH DAILY FOR HIGH BLOOD PRESSURE  . Blood Glucose Calibration (ACCU-CHEK AVIVA) SOLN Check blood sugar 3 times daily-DX-E11.22  . Cholecalciferol (VITAMIN D3) 2000 UNITS TABS Take 1 capsule by mouth 2 (two) times daily.  . dorzolamide-timolol (COSOPT) 22.3-6.8 MG/ML ophthalmic solution   . EASY COMFORT INSULIN SYRINGE 30G X 1/2" 0.5 ML MISC   . fenofibrate micronized (LOFIBRA) 134 MG capsule TAKE ONE CAPSULE BY MOUTH  DAILY FOR BLOOD FATS  . gabapentin (NEURONTIN) 600 MG tablet Take 1 tablet by mouth 4  times daily for pain  . glipiZIDE (GLUCOTROL) 5 MG tablet Take 1/2 tablet daily with your largest meal.  Do not take this medication if you have not eaten.  Marland Kitchen LANTUS SOLOSTAR 100 UNIT/ML Solostar Pen   . latanoprost (XALATAN) 0.005 % ophthalmic solution   . losartan (COZAAR) 100 MG tablet TAKE 1 TABLET BY MOUTH  DAILY FOR HIGH BLOOD  PRESSURE, HEART, AND  KIDNEYS  . LOTEMAX 0.5 % GEL   . Magnesium 500 MG CAPS Take 500 mg by mouth 2 (two) times daily.  . metFORMIN (GLUCOPHAGE) 1000 MG tablet TAKE ONE TABLET TWO TIMES A DAY AFTER MEALS FOR DIABETES  . Multiple  Vitamins-Minerals (HAIR/SKIN/NAILS) TABS Take by mouth 2 (two) times daily.  . pravastatin (PRAVACHOL) 40 MG tablet TAKE 1 TABLET BY MOUTH AT BEDTIME FOR CHOLESTEROL.  Marland Kitchen rOPINIRole (REQUIP) 3 MG tablet TAKE 1 TABLET BY MOUTH TWICE DAILY AND TAKE TWO TABLETS AT BEDTIME FOR RESTLESS LEGS  . azelastine (ASTELIN) 0.1 % nasal spray Place 2 sprays into both nostrils 2 (two) times daily. Use in each nostril as directed   No current facility-administered medications on file prior to visit.     Problem list She has Hypertension; Chronic pain; Uncontrolled secondary diabetes mellitus with stage 3 CKD (GFR 30-59) ([place]); Mixed hyperlipidemia; Vitamin D deficiency; Diabetes mellitus with neuropathy ([place]); Medication management; Diabetic retinopathy ([place]); and Encounter for Medicare annual wellness exam on her problem list.  Review of Systems  Constitutional: Positive for fatigue. Negative for chills, diaphoresis and fever.  HENT: Positive for postnasal drip, sinus pressure and sneezing. Negative for congestion, ear pain and sore throat.   Respiratory: Positive for cough and shortness of breath. Negative for chest tightness and wheezing.   Cardiovascular: Negative.   Gastrointestinal: Negative.   Genitourinary: Negative.   Musculoskeletal: Negative for neck pain.  Neurological: Positive for headaches.       Objective:   Physical Exam  Constitutional: She is oriented to person, place, and time. She appears well-developed and well-nourished. No distress.  HENT:  Head: Normocephalic.  Right  Ear: A middle ear effusion is present.  Left Ear: A middle ear effusion is present.  Nose: Mucosal edema present. Right sinus exhibits no frontal sinus tenderness. Left sinus exhibits no frontal sinus tenderness.  Mouth/Throat: Oropharynx is clear and moist. No oropharyngeal exudate.  Eyes: Conjunctivae are normal. No scleral icterus.  Neck: Normal range of motion. Neck supple. No JVD present. No thyromegaly  present.  Cardiovascular: Normal rate, regular rhythm, normal heart sounds and intact distal pulses.  Exam reveals no gallop and no friction rub.   No murmur heard. Pulmonary/Chest: Effort normal and breath sounds normal. No respiratory distress. She has no wheezes. She has no rales. She exhibits no tenderness.  Musculoskeletal: Normal range of motion.  Lymphadenopathy:    She has no cervical adenopathy.  Neurological: She is alert and oriented to person, place, and time.  Skin: Skin is warm and dry. She is not diaphoretic.  Psychiatric: She has a normal mood and affect. Her behavior is normal. Judgment and thought content normal.  Nursing note and vitals reviewed.     Assessment & Plan:  1. Nausea without vomiting/URI - azithromycin (ZITHROMAX) 250 MG tablet; Take 2 tablets (500 mg) on  Day 1,  followed by 1 tablet (250 mg) once daily on Days 2 through 5.  Dispense: 6 each; Refill: 1 Get on an allergy pill Will do dexamethasone shot   Ischial bursitis, unspecified laterality -     dexamethasone (DECADRON) injection 10 mg; Inject 1 mL (10 mg total) into the muscle once.

## 2016-07-05 DIAGNOSIS — H353221 Exudative age-related macular degeneration, left eye, with active choroidal neovascularization: Secondary | ICD-10-CM | POA: Diagnosis not present

## 2016-08-01 ENCOUNTER — Ambulatory Visit (INDEPENDENT_AMBULATORY_CARE_PROVIDER_SITE_OTHER): Payer: Medicare Other | Admitting: Internal Medicine

## 2016-08-01 ENCOUNTER — Encounter: Payer: Self-pay | Admitting: Internal Medicine

## 2016-08-01 VITALS — BP 140/70 | HR 60 | Temp 98.2°F | Resp 16 | Ht 62.0 in | Wt 127.0 lb

## 2016-08-01 DIAGNOSIS — E1365 Other specified diabetes mellitus with hyperglycemia: Secondary | ICD-10-CM

## 2016-08-01 DIAGNOSIS — E1140 Type 2 diabetes mellitus with diabetic neuropathy, unspecified: Secondary | ICD-10-CM

## 2016-08-01 DIAGNOSIS — E782 Mixed hyperlipidemia: Secondary | ICD-10-CM

## 2016-08-01 DIAGNOSIS — IMO0002 Reserved for concepts with insufficient information to code with codable children: Secondary | ICD-10-CM

## 2016-08-01 DIAGNOSIS — I10 Essential (primary) hypertension: Secondary | ICD-10-CM

## 2016-08-01 DIAGNOSIS — Z79899 Other long term (current) drug therapy: Secondary | ICD-10-CM

## 2016-08-01 DIAGNOSIS — E559 Vitamin D deficiency, unspecified: Secondary | ICD-10-CM | POA: Diagnosis not present

## 2016-08-01 DIAGNOSIS — N183 Chronic kidney disease, stage 3 (moderate): Secondary | ICD-10-CM

## 2016-08-01 DIAGNOSIS — E1322 Other specified diabetes mellitus with diabetic chronic kidney disease: Secondary | ICD-10-CM | POA: Diagnosis not present

## 2016-08-01 LAB — CBC WITH DIFFERENTIAL/PLATELET
Basophils Absolute: 77 cells/uL (ref 0–200)
Basophils Relative: 1 %
Eosinophils Absolute: 462 cells/uL (ref 15–500)
Eosinophils Relative: 6 %
HCT: 36.6 % (ref 35.0–45.0)
Hemoglobin: 12 g/dL (ref 11.7–15.5)
Lymphocytes Relative: 31 %
Lymphs Abs: 2387 cells/uL (ref 850–3900)
MCH: 30.8 pg (ref 27.0–33.0)
MCHC: 32.8 g/dL (ref 32.0–36.0)
MCV: 93.8 fL (ref 80.0–100.0)
MPV: 11.1 fL (ref 7.5–12.5)
Monocytes Absolute: 847 cells/uL (ref 200–950)
Monocytes Relative: 11 %
Neutro Abs: 3927 cells/uL (ref 1500–7800)
Neutrophils Relative %: 51 %
Platelets: 320 10*3/uL (ref 140–400)
RBC: 3.9 MIL/uL (ref 3.80–5.10)
RDW: 13.5 % (ref 11.0–15.0)
WBC: 7.7 10*3/uL (ref 3.8–10.8)

## 2016-08-01 LAB — LIPID PANEL
Cholesterol: 179 mg/dL (ref <200)
HDL: 51 mg/dL (ref >50)
LDL Cholesterol: 83 mg/dL (ref <100)
Total CHOL/HDL Ratio: 3.5 Ratio (ref <5.0)
Triglycerides: 225 mg/dL — ABNORMAL HIGH (ref <150)
VLDL: 45 mg/dL — ABNORMAL HIGH (ref <30)

## 2016-08-01 LAB — HEPATIC FUNCTION PANEL
ALT: 13 U/L (ref 6–29)
AST: 21 U/L (ref 10–35)
Albumin: 4.1 g/dL (ref 3.6–5.1)
Alkaline Phosphatase: 50 U/L (ref 33–130)
Bilirubin, Direct: 0.1 mg/dL (ref <=0.2)
Indirect Bilirubin: 0.2 mg/dL (ref 0.2–1.2)
Total Bilirubin: 0.3 mg/dL (ref 0.2–1.2)
Total Protein: 7.1 g/dL (ref 6.1–8.1)

## 2016-08-01 LAB — BASIC METABOLIC PANEL WITH GFR
BUN: 30 mg/dL — ABNORMAL HIGH (ref 7–25)
CO2: 25 mmol/L (ref 20–31)
Calcium: 9.9 mg/dL (ref 8.6–10.4)
Chloride: 103 mmol/L (ref 98–110)
Creat: 1.45 mg/dL — ABNORMAL HIGH (ref 0.60–0.88)
GFR, Est African American: 38 mL/min — ABNORMAL LOW (ref >=60)
GFR, Est Non African American: 33 mL/min — ABNORMAL LOW (ref >=60)
Glucose, Bld: 181 mg/dL — ABNORMAL HIGH (ref 65–99)
Potassium: 4.6 mmol/L (ref 3.5–5.3)
Sodium: 143 mmol/L (ref 135–146)

## 2016-08-01 LAB — TSH: TSH: 0.37 mIU/L — ABNORMAL LOW

## 2016-08-01 MED ORDER — TRAMADOL HCL 50 MG PO TABS: 100.0000 mg | ORAL_TABLET | Freq: Four times a day (QID) | ORAL | 0 refills | Status: AC | PRN

## 2016-08-01 NOTE — Progress Notes (Signed)
Assessment and Plan:  Hypertension:  -well controlled currently -Continue medication -monitor blood pressure at home. -Continue DASH diet -Reminder to go to the ER if any CP, SOB, nausea, dizziness, severe HA, changes vision/speech, left arm numbness and tingling and jaw pain.  Cholesterol -cont pravastatin, technically not at goal as LDL should be less than 70 for excellent cardiovascular risk reduction. Could consider either atorvastatin vs. crestor for further lowering or adding in zetia - Continue diet and exercise -Check cholesterol.   Diabetes with diabetic polyneuropathy, retinopathy -decrease lantus to 14 units at bedtime.  Consider splitting dose -cont medications -may dose adjust depending on A1c.  May also benefit to change to 70/30 -retinopathy is going to be treated with injections next month -asked patient to consider PT for balance caused by polyneuropathy -Continue diet and exercise.  -Check A1C  Vitamin D Def -continue medications.   Chronic constipation -add in miralax Wednesday and Sunday alongside stool softener -drink plenty of water -high fiber diet -exercise and move more for better bowel movements  Back pain and myalgias -refilled tramadol -cont salonpas as needed for pain and discomfort -argued that back brace would likely cause more core instability and thus greater back pain -heating pad as often as tolerated. -cont gabapentin   Continue diet and meds as discussed. Further disposition pending results of labs. Discussed med's effects and SE's.    HPI 83 y.o. female  presents for 3 month follow up with hypertension, hyperlipidemia, diabetes and vitamin D deficiency.   Her blood pressure has been controlled at home, today their BP is BP: 140/70.She does not workout. She denies chest pain, shortness of breath, dizziness.   She is on cholesterol medication and denies myalgias. Her cholesterol is at goal. The cholesterol was:  05/03/2016: Cholesterol  170; HDL 50; LDL Cholesterol 85; Triglycerides 174   She has been working on diet and exercise for diabetes with diabetic polyneuropathy, she is on bASA, she is on ACE/ARB, and denies  foot ulcerations, hyperglycemia, hypoglycemia , increased appetite, nausea, paresthesia of the feet, polydipsia, polyuria, visual disturbances, vomiting and weight loss. Last A1C was: 05/03/2016: Hgb A1c MFr Bld 10.5.  She reports that she is having early morning hypoglycemia.  She has had several readings that have been less than 50.  She does not eat anything before bedtime.  She does not have nightsweats or nightmares.     Patient is on Vitamin D supplement. 05/03/2016: Vit D, 25-Hydroxy 81  She reports that she has been having some pain in her right upper side.  She reports that the pain is like a catching pain.  She rpeorts that she had an injection in the left hip which was helpful. Being still is helpful.  She has been taking advil.  She reports that she ran out of the tramadol.    She would like to have some more.  She reports that she is staggering a lot.   She reports that she feels like she is off kilter a lot.  She knows where she is but if she can touch stuff that helps the staggering.  She does not feel dizzy.  She reports that she just can't really control her walking.  She does have severe neuropathy.      Current Medications:  Current Outpatient Prescriptions on File Prior to Visit  Medication Sig Dispense Refill  . ACCU-CHEK AVIVA PLUS test strip Test 3 times a day 300 each 3  . aspirin 81 MG tablet Take 81 mg  by mouth daily.    . ASSURE COMFORT LANCETS 30G MISC Check blood sugar 3 times daily-DX-E11.22 300 each 1  . bisoprolol-hydrochlorothiazide (ZIAC) 5-6.25 MG tablet TAKE 1 TABLET BY MOUTH DAILY FOR HIGH BLOOD PRESSURE 90 tablet 1  . Blood Glucose Calibration (ACCU-CHEK AVIVA) SOLN Check blood sugar 3 times daily-DX-E11.22 3 each 1  . Cholecalciferol (VITAMIN D3) 2000 UNITS TABS Take 1 capsule by  mouth 2 (two) times daily.    . dorzolamide-timolol (COSOPT) 22.3-6.8 MG/ML ophthalmic solution     . EASY COMFORT INSULIN SYRINGE 30G X 1/2" 0.5 ML MISC     . gabapentin (NEURONTIN) 600 MG tablet Take 1 tablet by mouth 4  times daily for pain 360 tablet 2  . glipiZIDE (GLUCOTROL) 5 MG tablet Take 1/2 tablet daily with your largest meal.  Do not take this medication if you have not eaten. 30 tablet 11  . LANTUS SOLOSTAR 100 UNIT/ML Solostar Pen     . latanoprost (XALATAN) 0.005 % ophthalmic solution     . LOTEMAX 0.5 % GEL     . Magnesium 500 MG CAPS Take 500 mg by mouth 2 (two) times daily.    . Multiple Vitamins-Minerals (HAIR/SKIN/NAILS) TABS Take by mouth 2 (two) times daily.    . pravastatin (PRAVACHOL) 40 MG tablet TAKE 1 TABLET BY MOUTH AT BEDTIME FOR CHOLESTEROL. 90 tablet 1  . rOPINIRole (REQUIP) 3 MG tablet TAKE 1 TABLET BY MOUTH TWICE DAILY AND TAKE TWO TABLETS AT BEDTIME FOR RESTLESS LEGS 120 tablet 1  . fenofibrate micronized (LOFIBRA) 134 MG capsule TAKE ONE CAPSULE BY MOUTH  DAILY FOR BLOOD FATS 90 capsule 3   No current facility-administered medications on file prior to visit.    Medical History:  Past Medical History:  Diagnosis Date  . Arthritis   . Chronic back pain   . Diabetic neuropathy ([place])   . Hypercholesteremia   . Hypertension   . Liver lesion   . Neuropathy ([place])   . Type II or unspecified type diabetes mellitus without mention of complication, not stated as uncontrolled   . Vitamin D deficiency   . Wears glasses    Allergies:  Allergies  Allergen Reactions  . Morphine And Related Other (See Comments)    fuzzy feeling     Review of Systems:  Review of Systems  Constitutional: Positive for malaise/fatigue. Negative for chills, diaphoresis and fever.  HENT: Negative for congestion, ear pain, hearing loss, sinus pain and sore throat.   Respiratory: Negative for cough, shortness of breath and wheezing.   Cardiovascular: Negative for chest pain,  palpitations and leg swelling.  Gastrointestinal: Positive for constipation. Negative for abdominal pain, blood in stool, diarrhea, heartburn, melena, nausea and vomiting.  Genitourinary: Negative.   Musculoskeletal: Positive for back pain, joint pain and myalgias.  Neurological: Positive for tingling and sensory change. Negative for dizziness, speech change, seizures, loss of consciousness and weakness.  Psychiatric/Behavioral: Negative for depression. The patient is not nervous/anxious and does not have insomnia.     Family history- Review and unchanged  Social history- Review and unchanged  Physical Exam: BP 140/70   Pulse 60   Temp 98.2 F (36.8 C) (Temporal)   Resp 16   Ht 5\' 2"  (1.575 m)   Wt 127 lb (57.6 kg)   BMI 23.23 kg/m  Wt Readings from Last 3 Encounters:  08/01/16 127 lb (57.6 kg)  06/30/16 127 lb (57.6 kg)  05/03/16 125 lb 12.8 oz (57.1 kg)   General  Appearance: Well nourished well developed, non-toxic appearing, in no apparent distress.  Well dressed and well groomed. Eyes: PERRLA, EOMs, conjunctiva no swelling or erythema ENT/Mouth: Ear canals clear with no erythema, swelling, or discharge.  TMs normal bilaterally, oropharynx clear, moist, with no exudate.   Neck: Supple, thyroid normal, no JVD, no cervical adenopathy.  Respiratory: Respiratory effort normal, breath sounds clear A&P, no wheeze, rhonchi or rales noted.  No retractions, no accessory muscle usage Cardio: RRR with no MRGs. No noted edema.  Abdomen: Soft, + BS.  Non tender, no guarding, rebound, hernias, masses. Musculoskeletal: Full ROM, 5/5 strength, unsteady gait without use of assistive device.  Minimal sensation to light touch of the bilateral feet Skin: Warm, dry without rashes, lesions, ecchymosis.  Neuro: Awake and oriented X 3, Cranial nerves intact. No cerebellar symptoms.  Psych: normal affect, Insight and Judgment appropriate.     Starlyn Skeans, PA-C 11:26 AM [facility]

## 2016-08-01 NOTE — Patient Instructions (Addendum)
You can try using that salonpas cream 2-3 times daily on your side.    You can use either a salon pas patch or you can use an icy hot patch below your back brace.  The neoprene wraps are the best back braces.    You do want to cut down to 14 units of lantus in the evenings.  I don't want your blood sugars dropping less than 70 in the mornings.    Try using miralax on Wednesday and Sunday.  Mix a capful of the powder in any type of liquid.  Please try to drink plenty of water.  Keep taking your stool softener daily.

## 2016-08-02 LAB — HEMOGLOBIN A1C
Hgb A1c MFr Bld: 7.4 % — ABNORMAL HIGH (ref <5.7)
Mean Plasma Glucose: 166 mg/dL

## 2016-08-23 DIAGNOSIS — M47816 Spondylosis without myelopathy or radiculopathy, lumbar region: Secondary | ICD-10-CM | POA: Diagnosis not present

## 2016-08-31 ENCOUNTER — Other Ambulatory Visit: Payer: Self-pay | Admitting: Neurosurgery

## 2016-08-31 ENCOUNTER — Ambulatory Visit (INDEPENDENT_AMBULATORY_CARE_PROVIDER_SITE_OTHER): Payer: Medicare Other | Admitting: *Deleted

## 2016-08-31 DIAGNOSIS — M47816 Spondylosis without myelopathy or radiculopathy, lumbar region: Secondary | ICD-10-CM

## 2016-08-31 DIAGNOSIS — E039 Hypothyroidism, unspecified: Secondary | ICD-10-CM

## 2016-08-31 LAB — TSH: TSH: 0.6 mIU/L

## 2016-08-31 NOTE — Progress Notes (Signed)
Patient here fr a [facility] to recheck a TSH, due to it being out of range at last labs.  The patient does not currently take medication for her thyroid.

## 2016-09-10 ENCOUNTER — Ambulatory Visit
Admission: RE | Admit: 2016-09-10 | Discharge: 2016-09-10 | Disposition: A | Discharge status: Home / Self Care | Payer: Medicare Other | Source: Ambulatory Visit | Attending: Neurosurgery | Admitting: Neurosurgery

## 2016-09-10 DIAGNOSIS — M47816 Spondylosis without myelopathy or radiculopathy, lumbar region: Secondary | ICD-10-CM

## 2016-09-10 DIAGNOSIS — M48061 Spinal stenosis, lumbar region without neurogenic claudication: Secondary | ICD-10-CM | POA: Diagnosis not present

## 2016-09-13 DIAGNOSIS — M5020 Other cervical disc displacement, unspecified cervical region: Secondary | ICD-10-CM | POA: Diagnosis not present

## 2016-09-13 DIAGNOSIS — M47816 Spondylosis without myelopathy or radiculopathy, lumbar region: Secondary | ICD-10-CM | POA: Diagnosis not present

## 2016-09-16 ENCOUNTER — Other Ambulatory Visit: Payer: Self-pay | Admitting: Neurosurgery

## 2016-09-16 DIAGNOSIS — M47816 Spondylosis without myelopathy or radiculopathy, lumbar region: Secondary | ICD-10-CM

## 2016-09-23 ENCOUNTER — Ambulatory Visit
Admission: RE | Admit: 2016-09-23 | Discharge: 2016-09-23 | Disposition: A | Discharge status: Home / Self Care | Payer: Medicare Other | Source: Ambulatory Visit | Attending: Neurosurgery | Admitting: Neurosurgery

## 2016-09-23 DIAGNOSIS — M47816 Spondylosis without myelopathy or radiculopathy, lumbar region: Secondary | ICD-10-CM

## 2016-09-23 DIAGNOSIS — M5126 Other intervertebral disc displacement, lumbar region: Secondary | ICD-10-CM | POA: Diagnosis not present

## 2016-09-23 MED ORDER — METHYLPREDNISOLONE ACETATE 40 MG/ML INJ SUSP (RADIOLOG
120.0000 mg | Freq: Once | INTRAMUSCULAR | Status: AC
  Administered 2016-09-23: 120 mg via EPIDURAL

## 2016-09-23 MED ORDER — IOPAMIDOL (ISOVUE-M 200) INJECTION 41%
1.0000 mL | Freq: Once | INTRAMUSCULAR | Status: AC
  Administered 2016-09-23: 1 mL via EPIDURAL

## 2016-09-23 NOTE — Discharge Instructions (Signed)

## 2016-09-27 DIAGNOSIS — H353212 Exudative age-related macular degeneration, right eye, with inactive choroidal neovascularization: Secondary | ICD-10-CM | POA: Diagnosis not present

## 2016-09-27 DIAGNOSIS — E119 Type 2 diabetes mellitus without complications: Secondary | ICD-10-CM | POA: Diagnosis not present

## 2016-09-27 DIAGNOSIS — H353221 Exudative age-related macular degeneration, left eye, with active choroidal neovascularization: Secondary | ICD-10-CM | POA: Diagnosis not present

## 2016-09-27 DIAGNOSIS — H353113 Nonexudative age-related macular degeneration, right eye, advanced atrophic without subfoveal involvement: Secondary | ICD-10-CM | POA: Diagnosis not present

## 2016-09-27 DIAGNOSIS — H43811 Vitreous degeneration, right eye: Secondary | ICD-10-CM | POA: Diagnosis not present

## 2016-10-03 ENCOUNTER — Other Ambulatory Visit: Payer: Self-pay | Admitting: *Deleted

## 2016-10-03 ENCOUNTER — Telehealth: Payer: Self-pay | Admitting: *Deleted

## 2016-10-03 DIAGNOSIS — N183 Chronic kidney disease, stage 3 (moderate): Secondary | ICD-10-CM | POA: Diagnosis not present

## 2016-10-03 MED ORDER — AZITHROMYCIN 250 MG PO TABS: ORAL_TABLET | ORAL | 0 refills | Status: DC

## 2016-10-03 NOTE — Telephone Encounter (Signed)
Patient called and states she has sinus drainage and pressure and a productive cough.  Per Dr Melford Aase, an [place] for a Z-pak has been sent in and patient was advised to take OTC Phenylephrine- PE .

## 2016-10-04 ENCOUNTER — Other Ambulatory Visit: Payer: Self-pay | Admitting: Nurse Practitioner

## 2016-10-04 DIAGNOSIS — H353113 Nonexudative age-related macular degeneration, right eye, advanced atrophic without subfoveal involvement: Secondary | ICD-10-CM | POA: Diagnosis not present

## 2016-10-04 DIAGNOSIS — H353212 Exudative age-related macular degeneration, right eye, with inactive choroidal neovascularization: Secondary | ICD-10-CM | POA: Diagnosis not present

## 2016-10-04 DIAGNOSIS — H353222 Exudative age-related macular degeneration, left eye, with inactive choroidal neovascularization: Secondary | ICD-10-CM | POA: Diagnosis not present

## 2016-10-04 DIAGNOSIS — E119 Type 2 diabetes mellitus without complications: Secondary | ICD-10-CM | POA: Diagnosis not present

## 2016-10-04 DIAGNOSIS — M47816 Spondylosis without myelopathy or radiculopathy, lumbar region: Secondary | ICD-10-CM

## 2016-10-04 DIAGNOSIS — H353123 Nonexudative age-related macular degeneration, left eye, advanced atrophic without subfoveal involvement: Secondary | ICD-10-CM | POA: Diagnosis not present

## 2016-10-12 ENCOUNTER — Ambulatory Visit
Admission: RE | Admit: 2016-10-12 | Discharge: 2016-10-12 | Disposition: A | Discharge status: Home / Self Care | Payer: Medicare Other | Source: Ambulatory Visit | Attending: Nurse Practitioner | Admitting: Nurse Practitioner

## 2016-10-12 DIAGNOSIS — M5126 Other intervertebral disc displacement, lumbar region: Secondary | ICD-10-CM | POA: Diagnosis not present

## 2016-10-12 DIAGNOSIS — M47816 Spondylosis without myelopathy or radiculopathy, lumbar region: Secondary | ICD-10-CM

## 2016-10-12 MED ORDER — IOPAMIDOL (ISOVUE-M 200) INJECTION 41%
1.0000 mL | Freq: Once | INTRAMUSCULAR | Status: AC
  Administered 2016-10-12: 1 mL via EPIDURAL

## 2016-10-12 MED ORDER — METHYLPREDNISOLONE ACETATE 40 MG/ML INJ SUSP (RADIOLOG
120.0000 mg | Freq: Once | INTRAMUSCULAR | Status: AC
  Administered 2016-10-12: 120 mg via EPIDURAL

## 2016-10-12 NOTE — Discharge Instructions (Signed)

## 2016-11-05 ENCOUNTER — Other Ambulatory Visit: Payer: Self-pay | Admitting: Internal Medicine

## 2016-11-06 DIAGNOSIS — I700 Atherosclerosis of aorta: Secondary | ICD-10-CM | POA: Insufficient documentation

## 2016-11-06 NOTE — Progress Notes (Signed)
Patient ID: [name], female   DOB: 1930-01-18, 83 y.o.   MRN: [id number]  MEDICARE ANNUAL WELLNESS VISIT AND OV  Assessment:    Essential hypertension - continue medications, DASH diet, exercise and monitor at home. Call if greater than 130/80. -     CBC with Differential/Platelet -     BASIC METABOLIC PANEL WITH GFR -     Hepatic function panel -     TSH  Uncontrolled secondary diabetes mellitus with stage 3 CKD (GFR 30-59) (HCC) Discussed general issues about diabetes pathophysiology and management., Educational material distributed., Suggested low cholesterol diet., Encouraged aerobic exercise., Discussed foot care., Reminded to get yearly retinal exam. -     Hemoglobin A1c  Controlled type 2 diabetes with neuropathy ([place]) Discussed general issues about diabetes pathophysiology and management., Educational material distributed., Suggested low cholesterol diet., Encouraged aerobic exercise., Discussed foot care., Reminded to get yearly retinal exam. -     Hemoglobin A1c  Diabetic retinopathy associated with type 2 diabetes mellitus, macular edema presence unspecified, unspecified laterality, unspecified retinopathy severity ([place]) Discussed general issues about diabetes pathophysiology and management., Educational material distributed., Suggested low cholesterol diet., Encouraged aerobic exercise., Discussed foot care., Reminded to get yearly retinal exam. -     Hemoglobin A1c  Mixed hyperlipidemia -continue medications, check lipids, decrease fatty foods, increase activity.  -     Lipid panel  Chronic pain syndrome monitor  Medication management -     Magnesium  Vitamin D deficiency  Encounter for Medicare annual wellness exam  Atherosclerosis of aorta ([place]) Control blood pressure, cholesterol, glucose, increase exercise.  -     Lipid panel -     Hemoglobin A1c  Diarrhea, unspecified type Add probiotic, diet discussed Benign AB If not better or any AB pain,  fever, chills, worsening symptoms back OV or go to ER   Plan:   During the course of the visit the patient was educated and counseled about appropriate screening and preventive services including:    Pneumococcal vaccine   Influenza vaccine  Td vaccine  Screening electrocardiogram  Bone densitometry screening  Colorectal cancer screening  Diabetes screening  Glaucoma screening  Nutrition counseling   Advanced directives: requested   Subjective:   [name]  presents for Medicare Annual Wellness Visit and follow up for HTN, chol, DM with CKD/retinopathy/neuropathy.   She has a garden, helps gets some of the beans/garden, states she is not able to do as much as she use to, will rest often.  She has had some loose stools x 1 weeks, some lower AB pain, feels stomach rumbling, this AM loose stool, has been eating a lot of fresh fruit. No fever, chills.  Patient is treated for HTN since 2000 & BP has been controlled at home. BP: 120/68  Patient had a negative Cardiolite in 2009. Patient has had no complaints of any cardiac type chest pain, palpitations, dyspnea/orthopnea/PND, dizziness, claudication, or dependent edema.  Hyperlipidemia is controlled with diet & meds. Patient denies myalgias or other med SE's. Last Lipids were at goal  Lab Results  Component Value Date   CHOL 179 08/01/2016   HDL 51 08/01/2016   LDLCALC 83 08/01/2016   TRIG 225 (H) 08/01/2016   CHOLHDL 3.5 08/01/2016   Also, the patient has history of T2_IDDM x 1996 and has been on insulin since 2000 and she has complications w/ CKD, retinopathy and Neuropathy, had recent cortisone shot in her back that ran up her sugars. She is  on glipizide 1/2 in the morning, she is on lantus 4 units. She has had no symptoms of reactive hypoglycemia, diabetic polys or visual blurring. She does report tingling and occasional painful paresthesias of the feet.  Lab Results  Component Value Date   HGBA1C 7.4 (H)  08/01/2016   Lab Results  Component Value Date   GFRNONAA 33 (L) 08/01/2016   Further, the patient also has history of Vitamin D Deficiency, on supplements.  Lab Results  Component Value Date   VD25OH 81 05/03/2016   BMI is Body mass index is 22.53 kg/m., she is working on diet and exercise. Wt Readings from Last 3 Encounters:  11/07/16 123 lb 3.2 oz (55.9 kg)  08/01/16 127 lb (57.6 kg)  06/30/16 127 lb (57.6 kg)    Names of Other Physician/Practitioners you currently use: 1. [facility] here for primary care 2. [facility], eye doctor, last visit in Dec 04 2015, goes back in aug 3. Dr Sissy Hoff, DDS, dentist, last visit July 2018  Patient Care Team: [name], MD as PCP - General (Internal Medicine) Warden Fillers, MD as Consulting Physician (Optometry) Zadie Rhine Clent Demark, MD as Consulting Physician (Ophthalmology) Glenna Fellows, MD as Attending Physician (Neurosurgery) Melrose Nakayama, MD as Consulting Physician (Orthopedic Surgery) Milus Banister, MD as Attending Physician (Gastroenterology)   Current Outpatient Prescriptions on File Prior to Visit  Medication Sig  . ACCU-CHEK AVIVA PLUS test strip Test 3 times a day  . aspirin 81 MG tablet Take 81 mg by mouth daily.  . ASSURE COMFORT LANCETS 30G MISC Check blood sugar 3 times daily-DX-E11.22  . bisoprolol-hydrochlorothiazide (ZIAC) 5-6.25 MG tablet TAKE 1 TABLET BY MOUTH DAILY FOR HIGH BLOOD PRESSURE  . Blood Glucose Calibration (ACCU-CHEK AVIVA) SOLN Check blood sugar 3 times daily-DX-E11.22  . Cholecalciferol (VITAMIN D3) 2000 UNITS TABS Take 1 capsule by mouth 2 (two) times daily.  . dorzolamide-timolol (COSOPT) 22.3-6.8 MG/ML ophthalmic solution   . EASY COMFORT INSULIN SYRINGE 30G X 1/2" 0.5 ML MISC   . glipiZIDE (GLUCOTROL) 5 MG tablet Take 1/2 tablet daily with your largest meal.  Do not take this medication if you have not eaten.  Marland Kitchen LANTUS SOLOSTAR 100  UNIT/ML Solostar Pen   . latanoprost (XALATAN) 0.005 % ophthalmic solution   . LOTEMAX 0.5 % GEL   . Magnesium 500 MG CAPS Take 500 mg by mouth 2 (two) times daily.  . Multiple Vitamins-Minerals (HAIR/SKIN/NAILS) TABS Take by mouth 2 (two) times daily.  . pravastatin (PRAVACHOL) 40 MG tablet TAKE ONE TABLET BY MOUTH AT BEDTIME FOR CHOLESTEROL  . traMADol (ULTRAM) 50 MG tablet Take 2 tablets (100 mg total) by mouth every 6 (six) hours as needed.   No current facility-administered medications on file prior to visit.    Patient Active Problem List   Diagnosis Date Noted  . Atherosclerosis of aorta ([place]) 11/06/2016  . Diabetic retinopathy ([place]) 05/18/2015  . Encounter for Medicare annual wellness exam 05/18/2015  . Medication management 08/06/2013  . Controlled type 2 diabetes with neuropathy ([place]) 06/04/2013  . Uncontrolled secondary diabetes mellitus with stage 3 CKD (GFR 30-59) (HCC)   . Mixed hyperlipidemia   . Vitamin D deficiency   . Hypertension 07/29/2012  . Chronic pain 07/29/2012    Screening Tests  Immunization History  Administered Date(s) Administered  . DT 01/04/2011  . Influenza Split 01/23/2013  . Influenza, High Dose Seasonal PF 02/27/2014, 03/11/2015, 12/21/2015  . Pneumococcal Conjugate-13 02/27/2014  .  Pneumococcal Polysaccharide-23 01/06/2012   Preventative care: Last colonoscopy: 2006 will not get another due to age [facility] 02/2016 CXR 11/2015 DEXA 2007 Echo 2014 Lumbar 2018 MRA 2014  Tetanus 2012 Flu 2017 Pneumo 2013 Prevnar 13 2015 Shingles vaccine declines  Allergies Allergies  Allergen Reactions  . Morphine And Related Other (See Comments)    fuzzy feeling    SURGICAL HISTORY She  has a past surgical history that includes Abdominal hysterectomy; Cesarean section; Back surgery (1993); Appendectomy; Colonoscopy; Eye surgery; and Trigger finger release (01/10/2012). FAMILY HISTORY Her family history includes Diabetes in her sister; Liver  disease in her sister. SOCIAL HISTORY She  reports that she has never smoked. She has never used smokeless tobacco. She reports that she does not drink alcohol or use drugs.  MEDICARE WELLNESS OBJECTIVES: Physical activity: Current Exercise Habits: The patient does not participate in regular exercise at present Cardiac risk factors: Cardiac Risk Factors include: advanced age (>42men, >59 women);diabetes mellitus;dyslipidemia;family history of premature cardiovascular disease;hypertension;sedentary lifestyle Depression/mood screen:   Depression screen [facility] 2/9 11/07/2016  Decreased Interest 0  Down, Depressed, Hopeless 0  PHQ - 2 Score 0    ADLs:  In your present state of health, do you have any difficulty performing the following activities: 11/07/2016 05/03/2016  Hearing? N N  Vision? N N  Difficulty concentrating or making decisions? N Y  Walking or climbing stairs? N N  Dressing or bathing? N N  Doing errands, shopping? N N  Preparing Food and eating ? N -  Using the Toilet? N -  In the past six months, have you accidently leaked urine? Y -  Do you have problems with loss of bowel control? N -  Managing your Medications? N -  Managing your Finances? N -  Housekeeping or managing your Housekeeping? N -  Some recent data might be hidden     Cognitive Testing  Alert? Yes  Normal Appearance?Yes  Oriented to person? Yes  Place? Yes   Time? Yes  Recall of three objects?  Yes  Can perform simple calculations? Yes  Displays appropriate judgment?Yes  Can read the correct time from a watch face?Yes  EOL planning: Does Patient Have a Medical Advance Directive?: No   Objective:     Blood pressure 120/68, pulse 62, temperature (!) 97.5 F (36.4 C), resp. rate 14, height 5\' 2"  (1.575 m), weight 123 lb 3.2 oz (55.9 kg), SpO2 97 %.   General Appearance: Well nourished, alert, WD/WN, female and in no apparent distress. Eyes: PERRLA, EOMs, conjunctiva no swelling or erythema, normal  fundi and vessels. Sinuses: No frontal/maxillary tenderness ENT/Mouth: EACs patent / TMs  nl. Nares clear without erythema, swelling, mucoid exudates. Oral hygiene is good. No erythema, swelling, or exudate. Tongue normal, non-obstructing. Tonsils not swollen or erythematous. Hearing normal.  Neck: Supple, thyroid normal. No bruits, nodes or JVD. Respiratory: Respiratory effort normal.  BS equal and clear bilateral without rales, rhonci, wheezing or stridor. Cardio: Heart sounds are normal with regular rate and rhythm and no murmurs, rubs or gallops. Peripheral pulses are normal and equal bilaterally without edema. No aortic or femoral bruits. Chest: symmetric with normal excursions and percussion. Abdomen: Flat, soft  with nl bowel sounds. Nontender, no guarding, rebound, hernias, masses, or organomegaly.  Lymphatics: Non tender without lymphadenopathy.  Musculoskeletal: Full ROM all peripheral extremities, joint stability, 5/5 strength, and normal gait. Skin: right flank with dry skin, excoriations, fine papules, no vesicles.  Neuro: Cranial nerves intact. DTR's UE  Nl/Equal  and DTR's LE's absent.  Normal muscle tone, no cerebellar symptoms. Sensation decreased bilateral feet. Pysch: Alert and oriented X 3, normal affect, Insight and Judgment appropriate.    Medicare Attestation I have personally reviewed: The patient's medical and social history Their use of alcohol, tobacco or illicit drugs Their current medications and supplements The patient's functional ability including ADLs,fall risks, home safety risks, cognitive, and hearing and visual impairment Diet and physical activities Evidence for depression or mood disorders  The patient's weight, height, BMI, and visual acuity have been recorded in the chart.  I have made referrals, counseling, and provided education to the patient based on review of the above and I have provided the patient with a written personalized care plan for  preventive services.  Over 40 minutes of exam, counseling, chart review was performed.  Vicie Mutters, PA-C   11/07/2016

## 2016-11-07 ENCOUNTER — Encounter: Payer: Self-pay | Admitting: Physician Assistant

## 2016-11-07 ENCOUNTER — Ambulatory Visit: Payer: Self-pay | Admitting: Internal Medicine

## 2016-11-07 ENCOUNTER — Other Ambulatory Visit: Payer: Self-pay

## 2016-11-07 ENCOUNTER — Ambulatory Visit (INDEPENDENT_AMBULATORY_CARE_PROVIDER_SITE_OTHER): Payer: Medicare Other | Admitting: Physician Assistant

## 2016-11-07 VITALS — BP 120/68 | HR 62 | Temp 97.5°F | Resp 14 | Ht 62.0 in | Wt 123.2 lb

## 2016-11-07 DIAGNOSIS — E1322 Other specified diabetes mellitus with diabetic chronic kidney disease: Secondary | ICD-10-CM | POA: Diagnosis not present

## 2016-11-07 DIAGNOSIS — Z0000 Encounter for general adult medical examination without abnormal findings: Secondary | ICD-10-CM

## 2016-11-07 DIAGNOSIS — E1140 Type 2 diabetes mellitus with diabetic neuropathy, unspecified: Secondary | ICD-10-CM

## 2016-11-07 DIAGNOSIS — N183 Chronic kidney disease, stage 3 (moderate): Secondary | ICD-10-CM

## 2016-11-07 DIAGNOSIS — R197 Diarrhea, unspecified: Secondary | ICD-10-CM

## 2016-11-07 DIAGNOSIS — R6889 Other general symptoms and signs: Secondary | ICD-10-CM | POA: Diagnosis not present

## 2016-11-07 DIAGNOSIS — I10 Essential (primary) hypertension: Secondary | ICD-10-CM | POA: Diagnosis not present

## 2016-11-07 DIAGNOSIS — E11319 Type 2 diabetes mellitus with unspecified diabetic retinopathy without macular edema: Secondary | ICD-10-CM

## 2016-11-07 DIAGNOSIS — Z79899 Other long term (current) drug therapy: Secondary | ICD-10-CM | POA: Diagnosis not present

## 2016-11-07 DIAGNOSIS — G894 Chronic pain syndrome: Secondary | ICD-10-CM

## 2016-11-07 DIAGNOSIS — E1365 Other specified diabetes mellitus with hyperglycemia: Secondary | ICD-10-CM

## 2016-11-07 DIAGNOSIS — I700 Atherosclerosis of aorta: Secondary | ICD-10-CM

## 2016-11-07 DIAGNOSIS — E782 Mixed hyperlipidemia: Secondary | ICD-10-CM

## 2016-11-07 DIAGNOSIS — E559 Vitamin D deficiency, unspecified: Secondary | ICD-10-CM | POA: Diagnosis not present

## 2016-11-07 DIAGNOSIS — IMO0002 Reserved for concepts with insufficient information to code with codable children: Secondary | ICD-10-CM

## 2016-11-07 DIAGNOSIS — Z0001 Encounter for general adult medical examination with abnormal findings: Secondary | ICD-10-CM | POA: Diagnosis not present

## 2016-11-07 LAB — LIPID PANEL
Cholesterol: 157 mg/dL (ref <200)
HDL: 65 mg/dL (ref >50)
LDL Cholesterol: 76 mg/dL (ref <100)
Total CHOL/HDL Ratio: 2.4 Ratio (ref <5.0)
Triglycerides: 80 mg/dL (ref <150)
VLDL: 16 mg/dL (ref <30)

## 2016-11-07 LAB — BASIC METABOLIC PANEL WITH GFR
BUN: 31 mg/dL — ABNORMAL HIGH (ref 7–25)
CO2: 30 mmol/L (ref 20–31)
Calcium: 10.4 mg/dL (ref 8.6–10.4)
Chloride: 102 mmol/L (ref 98–110)
Creat: 1.36 mg/dL — ABNORMAL HIGH (ref 0.60–0.88)
GFR, Est African American: 41 mL/min — ABNORMAL LOW (ref >=60)
GFR, Est Non African American: 36 mL/min — ABNORMAL LOW (ref >=60)
Glucose, Bld: 80 mg/dL (ref 65–99)
Potassium: 4.8 mmol/L (ref 3.5–5.3)
Sodium: 140 mmol/L (ref 135–146)

## 2016-11-07 LAB — CBC WITH DIFFERENTIAL/PLATELET
Basophils Absolute: 68 cells/uL (ref 0–200)
Basophils Relative: 1 %
Eosinophils Absolute: 272 cells/uL (ref 15–500)
Eosinophils Relative: 4 %
HCT: 36.6 % (ref 35.0–45.0)
Hemoglobin: 12.2 g/dL (ref 11.7–15.5)
Lymphocytes Relative: 34 %
Lymphs Abs: 2312 cells/uL (ref 850–3900)
MCH: 31.4 pg (ref 27.0–33.0)
MCHC: 33.3 g/dL (ref 32.0–36.0)
MCV: 94.3 fL (ref 80.0–100.0)
MPV: 11.4 fL (ref 7.5–12.5)
Monocytes Absolute: 748 cells/uL (ref 200–950)
Monocytes Relative: 11 %
Neutro Abs: 3400 cells/uL (ref 1500–7800)
Neutrophils Relative %: 50 %
Platelets: 343 10*3/uL (ref 140–400)
RBC: 3.88 MIL/uL (ref 3.80–5.10)
RDW: 13.6 % (ref 11.0–15.0)
WBC: 6.8 10*3/uL (ref 3.8–10.8)

## 2016-11-07 LAB — HEPATIC FUNCTION PANEL
ALT: 12 U/L (ref 6–29)
AST: 19 U/L (ref 10–35)
Albumin: 4.1 g/dL (ref 3.6–5.1)
Alkaline Phosphatase: 49 U/L (ref 33–130)
Bilirubin, Direct: 0.1 mg/dL (ref <=0.2)
Indirect Bilirubin: 0.2 mg/dL (ref 0.2–1.2)
Total Bilirubin: 0.3 mg/dL (ref 0.2–1.2)
Total Protein: 6.9 g/dL (ref 6.1–8.1)

## 2016-11-07 LAB — TSH: TSH: 0.63 mIU/L

## 2016-11-07 MED ORDER — FENOFIBRATE MICRONIZED 134 MG PO CAPS: ORAL_CAPSULE | ORAL | 3 refills | Status: DC

## 2016-11-07 MED ORDER — ROPINIROLE HCL 3 MG PO TABS: ORAL_TABLET | ORAL | 1 refills | Status: DC

## 2016-11-07 NOTE — Patient Instructions (Addendum)
Here is sliding scale for your insulin. This will help control the spikes in sugar depending on what you are eating. Please remember only take the insulin WITH food, if you are sick or unable to eat DO NOT take your insulin. Also a low blood sugar is much more dangerous than a high blood sugar. Your brain needs 2 things, oxygen and sugar, so lets make sure it gets both. If at any time you have a question or concern, call the office or message [place] in [place].   Sugar Insulin  <70  NONE  70-130 None  131-180 2 units  181-240 4 unit  241-300 6 units  301-350 8 units  351-400 10 units and call the doctor   If you start to have worsening AB pain, diarrhea, fever, chills, decreased appetite  Go to Er or call the office   Diarrhea, Adult Diarrhea is frequent loose and watery bowel movements. Diarrhea can make you feel weak and cause you to become dehydrated. Dehydration can make you tired and thirsty, cause you to have a dry mouth, and decrease how often you urinate. Diarrhea typically lasts 2-3 days. However, it can last longer if it is a sign of something more serious. It is important to treat your diarrhea as told by your health care provider. Follow these instructions at home: Eating and drinking  Follow these recommendations as told by your health care provider:  Take an oral rehydration solution (ORS). This is a drink that is sold at pharmacies and retail stores.  Drink clear fluids, such as water, ice chips, diluted fruit juice, and low-calorie sports drinks.  Eat bland, easy-to-digest foods in small amounts as you are able. These foods include bananas, applesauce, rice, lean meats, toast, and crackers.  Avoid drinking fluids that contain a lot of sugar or caffeine, such as energy drinks, sports drinks, and soda.  Avoid alcohol.  Avoid spicy or fatty foods.  General instructions  Drink enough fluid to keep your urine clear or pale yellow.  Wash your hands often. If soap and water  are not available, use hand sanitizer.  Make sure that all people in your household wash their hands well and often.  Take over-the-counter and prescription medicines only as told by your health care provider.  Rest at home while you recover.  Watch your condition for any changes.  Take a warm bath to relieve any burning or pain from frequent diarrhea episodes.  Keep all follow-up visits as told by your health care provider. This is important. Contact a health care provider if:  You have a fever.  Your diarrhea gets worse.  You have new symptoms.  You cannot keep fluids down.  You feel light-headed or dizzy.  You have a headache  You have muscle cramps. Get help right away if:  You have chest pain.  You feel extremely weak or you faint.  You have bloody or black stools or stools that look like tar.  You have severe pain, cramping, or bloating in your abdomen.  You have trouble breathing or you are breathing very quickly.  Your heart is beating very quickly.  Your skin feels cold and clammy.  You feel confused.  You have signs of dehydration, such as: ? Dark urine, very little urine, or no urine. ? Cracked lips. ? Dry mouth. ? Sunken eyes. ? Sleepiness. ? Weakness. This information is not intended to replace advice given to you by your health care provider. Make sure you discuss any questions  you have with your health care provider. Document Released: 04/01/2002 Document Revised: 08/20/2015 Document Reviewed: 12/16/2014 Elsevier Interactive Patient Education  2017 [employer] for Gastroesophageal Reflux Disease, Adult When you have gastroesophageal reflux disease (GERD), the foods you eat and your eating habits are very important. Choosing the right foods can help ease your discomfort. What guidelines do I need to follow?  Choose fruits, vegetables, whole grains, and low-fat dairy products.  Choose low-fat meat, fish, and  poultry.  Limit fats such as oils, salad dressings, butter, nuts, and avocado.  Keep a food diary. This helps you identify foods that cause symptoms.  Avoid foods that cause symptoms. These may be different for everyone.  Eat small meals often instead of 3 large meals a day.  Eat your meals slowly, in a place where you are relaxed.  Limit fried foods.  Cook foods using methods other than frying.  Avoid drinking alcohol.  Avoid drinking large amounts of liquids with your meals.  Avoid bending over or lying down until 2-3 hours after eating. What foods are not recommended? These are some foods and drinks that may make your symptoms worse: Vegetables Tomatoes. Tomato juice. Tomato and spaghetti sauce. Chili peppers. Onion and garlic. Horseradish. Fruits Oranges, grapefruit, and lemon (fruit and juice). Meats High-fat meats, fish, and poultry. This includes hot dogs, ribs, ham, sausage, salami, and bacon. Dairy Whole milk and chocolate milk. Sour cream. Cream. Butter. Ice cream. Cream cheese. Drinks Coffee and tea. Bubbly (carbonated) drinks or energy drinks. Condiments Hot sauce. Barbecue sauce. Sweets/Desserts Chocolate and cocoa. Donuts. Peppermint and spearmint. Fats and Oils High-fat foods. This includes [place] fries and potato chips. Other Vinegar. Strong spices. This includes black pepper, white pepper, red pepper, cayenne, curry powder, cloves, ginger, and chili powder. The items listed above may not be a complete list of foods and drinks to avoid. Contact your dietitian for more information. This information is not intended to replace advice given to you by your health care provider. Make sure you discuss any questions you have with your health care provider. Document Released: 10/11/2011 Document Revised: 09/17/2015 Document Reviewed: 02/13/2013 Elsevier Interactive Patient Education  2017 [employer].

## 2016-11-08 LAB — MAGNESIUM: Magnesium: 1.8 mg/dL (ref 1.5–2.5)

## 2016-11-08 LAB — HEMOGLOBIN A1C
Hgb A1c MFr Bld: 8 % — ABNORMAL HIGH (ref <5.7)
Mean Plasma Glucose: 183 mg/dL

## 2016-11-08 NOTE — Progress Notes (Signed)
Pt aware of lab results & voiced understanding of those results.

## 2016-11-09 ENCOUNTER — Emergency Department (HOSPITAL_COMMUNITY): Payer: Medicare Other

## 2016-11-09 ENCOUNTER — Emergency Department (HOSPITAL_COMMUNITY)
Admission: EM | Admit: 2016-11-09 | Discharge: 2016-11-09 | Disposition: A | Discharge status: Home / Self Care | Payer: Medicare Other | Attending: Emergency Medicine | Admitting: Emergency Medicine

## 2016-11-09 ENCOUNTER — Encounter (HOSPITAL_COMMUNITY): Payer: Self-pay

## 2016-11-09 DIAGNOSIS — R404 Transient alteration of awareness: Secondary | ICD-10-CM | POA: Diagnosis not present

## 2016-11-09 DIAGNOSIS — Z79899 Other long term (current) drug therapy: Secondary | ICD-10-CM | POA: Diagnosis not present

## 2016-11-09 DIAGNOSIS — I10 Essential (primary) hypertension: Secondary | ICD-10-CM | POA: Diagnosis not present

## 2016-11-09 DIAGNOSIS — Z7902 Long term (current) use of antithrombotics/antiplatelets: Secondary | ICD-10-CM | POA: Diagnosis not present

## 2016-11-09 DIAGNOSIS — R55 Syncope and collapse: Secondary | ICD-10-CM | POA: Diagnosis not present

## 2016-11-09 DIAGNOSIS — R531 Weakness: Secondary | ICD-10-CM | POA: Diagnosis not present

## 2016-11-09 DIAGNOSIS — E119 Type 2 diabetes mellitus without complications: Secondary | ICD-10-CM | POA: Diagnosis not present

## 2016-11-09 DIAGNOSIS — R42 Dizziness and giddiness: Secondary | ICD-10-CM | POA: Insufficient documentation

## 2016-11-09 DIAGNOSIS — Z7982 Long term (current) use of aspirin: Secondary | ICD-10-CM | POA: Insufficient documentation

## 2016-11-09 DIAGNOSIS — Z794 Long term (current) use of insulin: Secondary | ICD-10-CM | POA: Insufficient documentation

## 2016-11-09 LAB — COMPREHENSIVE METABOLIC PANEL
ALT: 15 U/L (ref 14–54)
AST: 23 U/L (ref 15–41)
Albumin: 3.7 g/dL (ref 3.5–5.0)
Alkaline Phosphatase: 43 U/L (ref 38–126)
Anion gap: 8 (ref 5–15)
BUN: 30 mg/dL — ABNORMAL HIGH (ref 6–20)
CO2: 29 mmol/L (ref 22–32)
Calcium: 9.6 mg/dL (ref 8.9–10.3)
Chloride: 100 mmol/L — ABNORMAL LOW (ref 101–111)
Creatinine, Ser: 1.63 mg/dL — ABNORMAL HIGH (ref 0.44–1.00)
GFR calc Af Amer: 32 mL/min — ABNORMAL LOW (ref >60)
GFR calc non Af Amer: 28 mL/min — ABNORMAL LOW (ref >60)
Glucose, Bld: 186 mg/dL — ABNORMAL HIGH (ref 65–99)
Potassium: 3.6 mmol/L (ref 3.5–5.1)
Sodium: 137 mmol/L (ref 135–145)
Total Bilirubin: 0.4 mg/dL (ref 0.3–1.2)
Total Protein: 6.6 g/dL (ref 6.5–8.1)

## 2016-11-09 LAB — CBC WITH DIFFERENTIAL/PLATELET
Basophils Absolute: 0 10*3/uL (ref 0.0–0.1)
Basophils Relative: 0 %
Eosinophils Absolute: 0.3 10*3/uL (ref 0.0–0.7)
Eosinophils Relative: 4 %
HCT: 33.7 % — ABNORMAL LOW (ref 36.0–46.0)
Hemoglobin: 11.6 g/dL — ABNORMAL LOW (ref 12.0–15.0)
Lymphocytes Relative: 33 %
Lymphs Abs: 3.1 10*3/uL (ref 0.7–4.0)
MCH: 31.7 pg (ref 26.0–34.0)
MCHC: 34.4 g/dL (ref 30.0–36.0)
MCV: 92.1 fL (ref 78.0–100.0)
Monocytes Absolute: 0.7 10*3/uL (ref 0.1–1.0)
Monocytes Relative: 8 %
Neutro Abs: 5.3 10*3/uL (ref 1.7–7.7)
Neutrophils Relative %: 55 %
Platelets: 289 10*3/uL (ref 150–400)
RBC: 3.66 MIL/uL — ABNORMAL LOW (ref 3.87–5.11)
RDW: 13.5 % (ref 11.5–15.5)
WBC: 9.4 10*3/uL (ref 4.0–10.5)

## 2016-11-09 LAB — URINALYSIS, ROUTINE W REFLEX MICROSCOPIC
Bilirubin Urine: NEGATIVE
Glucose, UA: NEGATIVE mg/dL
Hgb urine dipstick: NEGATIVE
Ketones, ur: NEGATIVE mg/dL
Leukocytes, UA: NEGATIVE
Nitrite: NEGATIVE
Protein, ur: NEGATIVE mg/dL
Specific Gravity, Urine: 1.012 (ref 1.005–1.030)
pH: 8 (ref 5.0–8.0)

## 2016-11-09 LAB — I-STAT TROPONIN, ED: Troponin i, poc: 0 ng/mL (ref 0.00–0.08)

## 2016-11-09 NOTE — ED Notes (Signed)
Patient transported to CT 

## 2016-11-09 NOTE — ED Provider Notes (Signed)
[facility] DEPT Provider Note   CSN: [id number] Arrival date & time: 11/09/16  0123  By signing my name below, I, Margit Banda, attest that this documentation has been prepared under the direction and in the presence of Abdifatah Colquhoun, Gwenyth Allegra, MD. Electronically Signed: Margit Banda, ED Scribe. 11/09/16. 1:27 AM.  History   Chief Complaint Chief Complaint  Patient presents with  . Dizziness    HPI [name] is a 83 y.o. female with a PMHx of neuropathy, HTN, and DM who presents to the Emergency Department complaining of gradually worsening, dizziness that started shortly PTA. Pt reports onset started while she was walking. Associated sx include lightheadedness, nausea and HA. No recent medication changes. Pt had a recent normal well visit with her PCP. Pt denies fever, vomiting or any other sx at this time.   The history is provided by the patient and the EMS personnel. No language interpreter was used.    Past Medical History:  Diagnosis Date  . Arthritis   . Chronic back pain   . Diabetic neuropathy ([place])   . Hypercholesteremia   . Hypertension   . Liver lesion   . Neuropathy   . Type II or unspecified type diabetes mellitus without mention of complication, not stated as uncontrolled   . Vitamin D deficiency   . Wears glasses     Patient Active Problem List   Diagnosis Date Noted  . Atherosclerosis of aorta ([place]) 11/06/2016  . Diabetic retinopathy ([place]) 05/18/2015  . Encounter for Medicare annual wellness exam 05/18/2015  . Medication management 08/06/2013  . Controlled type 2 diabetes with neuropathy ([place]) 06/04/2013  . Uncontrolled secondary diabetes mellitus with stage 3 CKD (GFR 30-59) ([facility])   . Mixed hyperlipidemia   . Vitamin D deficiency   . Hypertension 07/29/2012  . Chronic pain 07/29/2012    Past Surgical History:  Procedure Laterality Date  . ABDOMINAL HYSTERECTOMY    . APPENDECTOMY    . BACK SURGERY  1993   lumb lam-fusion  . CESAREAN  SECTION     x 3   . COLONOSCOPY    . EYE SURGERY     both cataracts  . TRIGGER FINGER RELEASE  01/10/2012   Procedure: RELEASE TRIGGER FINGER/A-1 PULLEY;  Surgeon: Hessie Dibble, MD;  Location: [facility];  Service: Orthopedics;  Laterality: Right;  right ring trigger release    OB History    No data available       Home Medications    Prior to Admission medications   Medication Sig Start Date End Date Taking? Authorizing Provider  ACCU-CHEK AVIVA PLUS test strip Test 3 times a day 08/28/15   [name], MD  aspirin 81 MG tablet Take 81 mg by mouth daily.    [provider]  ASSURE COMFORT LANCETS 30G MISC Check blood sugar 3 times daily-DX-E11.22 03/11/15   [name], MD  bisoprolol-hydrochlorothiazide [facility]) 5-6.25 MG tablet TAKE 1 TABLET BY MOUTH DAILY FOR HIGH BLOOD PRESSURE 05/03/16   [name], MD  Blood Glucose Calibration (ACCU-CHEK AVIVA) SOLN Check blood sugar 3 times daily-DX-E11.22 03/11/15   [name], MD  Cholecalciferol (VITAMIN D3) 2000 UNITS TABS Take 1 capsule by mouth 2 (two) times daily.    [provider]  dorzolamide-timolol (COSOPT) 22.3-6.8 MG/ML ophthalmic solution  03/31/16   [provider]  EASY COMFORT INSULIN SYRINGE 30G X 1/2" 0.5 ML MISC  10/01/15   [provider]  fenofibrate micronized (LOFIBRA) 134 MG capsule  TAKE ONE CAPSULE BY MOUTH  DAILY FOR BLOOD FATS 11/07/16   Vicie Mutters, PA-C  glipiZIDE (GLUCOTROL) 5 MG tablet Take 1/2 tablet daily with your largest meal.  Do not take this medication if you have not eaten. 01/27/16 01/26/17  Forcucci, Courtney, PA-C  LANTUS SOLOSTAR 100 UNIT/ML Solostar Pen  04/13/16   [provider]  latanoprost (XALATAN) 0.005 % ophthalmic solution  03/28/16   [provider]  LOTEMAX 0.5 % GEL  04/14/16   [provider]  Magnesium 500 MG CAPS Take 500 mg by mouth 2 (two) times daily.    [provider]    Multiple Vitamins-Minerals (HAIR/SKIN/NAILS) TABS Take by mouth 2 (two) times daily.    [provider]  pravastatin (PRAVACHOL) 40 MG tablet TAKE ONE TABLET BY MOUTH AT BEDTIME FOR CHOLESTEROL 11/05/16   [name], MD  rOPINIRole (REQUIP) 3 MG tablet TAKE 1 TABLET BY MOUTH TWICE DAILY AND TAKE TWO TABLETS AT BEDTIME FOR RESTLESS LEGS 11/07/16   Vicie Mutters, PA-C  traMADol (ULTRAM) 50 MG tablet Take 2 tablets (100 mg total) by mouth every 6 (six) hours as needed. 08/01/16 08/01/17  Forcucci, Loma Sousa, PA-C    Family History Family History  Problem Relation Age of Onset  . Liver disease Sister   . Diabetes Sister   . Colon cancer Neg Hx     Social History Social History  Substance Use Topics  . Smoking status: Never Smoker  . Smokeless tobacco: Never Used  . Alcohol use No     Allergies   Morphine and related   Review of Systems Review of Systems  Constitutional: Negative for fever.  Gastrointestinal: Positive for nausea. Negative for vomiting.  Neurological: Positive for dizziness, light-headedness and headaches.  All other systems reviewed and are negative.    Physical Exam Updated Vital Signs BP (!) 125/52   Pulse (!) 55   Temp 98.1 F (36.7 C) (Oral)   Resp 16   Ht 5\' 2"  (1.575 m)   Wt 55.8 kg (123 lb)   SpO2 94%   BMI 22.50 kg/m   Physical Exam  Constitutional: She is oriented to person, place, and time. She appears well-developed and well-nourished. No distress.  HENT:  Head: Normocephalic and atraumatic.  Right Ear: Hearing normal.  Left Ear: Hearing normal.  Nose: Nose normal.  Mouth/Throat: Oropharynx is clear and moist and mucous membranes are normal.  Eyes: Pupils are equal, round, and reactive to light. Conjunctivae and EOM are normal.  Neck: Normal range of motion. Neck supple.  Cardiovascular: Regular rhythm, S1 normal and S2 normal.  Bradycardia present.  Exam reveals no gallop and no friction rub.   No murmur  heard. Pulmonary/Chest: Effort normal and breath sounds normal. No respiratory distress. She exhibits no tenderness.  Abdominal: Soft. Normal appearance and bowel sounds are normal. There is no hepatosplenomegaly. There is no tenderness. There is no rebound, no guarding, no tenderness at McBurney's point and negative Murphy's sign. No hernia.  Musculoskeletal: Normal range of motion.  Neurological: She is alert and oriented to person, place, and time. She has normal strength. No cranial nerve deficit or sensory deficit. Coordination normal. GCS eye subscore is 4. GCS verbal subscore is 5. GCS motor subscore is 6.  Extraocular muscle movement: normal No visual field cut Pupils: equal and reactive both direct and consensual response is normal No nystagmus present    Sensory function is intact to light touch, pinprick Proprioception intact  Grip strength 5/5 symmetric in  upper extremities No pronator drift Normal finger to nose bilaterally  Lower extremity strength 5/5 against gravity Normal heel to shin bilaterally     Skin: Skin is warm, dry and intact. No rash noted. No cyanosis.  Psychiatric: She has a normal mood and affect. Her speech is normal and behavior is normal. Thought content normal.  Nursing note and vitals reviewed.    ED Treatments / Results  DIAGNOSTIC STUDIES: Oxygen Saturation is 100% on RA, normal by my interpretation.   COORDINATION OF CARE: 1:27 AM-Discussed next steps with pt. Pt verbalized understanding and is agreeable with the plan.   Labs (all labs ordered are listed, but only abnormal results are displayed) Labs Reviewed  CBC WITH DIFFERENTIAL/PLATELET - Abnormal; Notable for the following:       Result Value   RBC 3.66 (*)    Hemoglobin 11.6 (*)    HCT 33.7 (*)    All other components within normal limits  COMPREHENSIVE METABOLIC PANEL - Abnormal; Notable for the following:    Chloride 100 (*)    Glucose, Bld 186 (*)    BUN 30 (*)     Creatinine, Ser 1.63 (*)    GFR calc non Af Amer 28 (*)    GFR calc Af Amer 32 (*)    All other components within normal limits  URINALYSIS, ROUTINE W REFLEX MICROSCOPIC - Abnormal; Notable for the following:    APPearance CLOUDY (*)    All other components within normal limits  I-STAT TROPOININ, ED    EKG  EKG Interpretation  Date/Time:  Wednesday November 09 2016 01:32:02 EDT Ventricular Rate:  53 PR Interval:    QRS Duration: 91 QT Interval:  434 QTC Calculation: 408 R Axis:   74 Text Interpretation:  Sinus bradycardia Otherwise within normal limits Confirmed by Orpah Greek [phone]) on 11/09/2016 1:36:27 AM       Radiology Ct Head Wo Contrast  Result Date: 11/09/2016 CLINICAL DATA:  Dizziness EXAM: CT HEAD WITHOUT CONTRAST TECHNIQUE: Contiguous axial images were obtained from the base of the skull through the vertex without intravenous contrast. COMPARISON:  Brain MRI 07/29/2012 FINDINGS: Brain: There is a focus of hypoattenuation in the right frontal white matter that is new compared to the head CT of 07/29/2012. There is no acute hemorrhage. There is mild periventricular hypoattenuation otherwise, but no other focal parenchymal abnormality. No midline shift. No hydrocephalus. Vascular: No hyperdense vessel or unexpected calcification. Skull: Normal visualized skull base, calvarium and extracranial soft tissues. Sinuses/Orbits: No sinus fluid levels or advanced mucosal thickening. No mastoid effusion. Normal orbits. IMPRESSION: 1. New focus of hypoattenuation within the right frontal white matter compared to the remote prior studies of April 2014. This is probably the sequela of an old ischemic event. 2. No acute hemorrhage or other acute abnormality. Electronically Signed   By: Ulyses Jarred M.D.   On: 11/09/2016 02:26    Procedures Procedures (including critical care time)  Medications Ordered in ED Medications - No data to display   Initial Impression / Assessment  and Plan / ED Course  I have reviewed the triage vital signs and the nursing notes.  Pertinent labs & imaging results that were available during my care of the patient were reviewed by me and considered in my medical decision making (see chart for details).     Patient presents to the emergency department for evaluation of dizziness. She reports that she has been noticing some dizziness tonight when she moves. She describes this  as lightheadedness. There is no vertigo. She has a slight headache. She reports that when she was walking she felt like she was going to fall so she had to sit down on the floor, did not fall. She is complaining of increasing pain in her right foot secondary to chronic neuropathy from diabetes. There is no neurologic deficit noted on examination. Workup is entirely normal. CT head unremarkable. Lab work unremarkable. Urinalysis negative for signs of infection. Patient continues to do well here in the ER. No progression of symptoms, no concerning symptoms on examination and requires further workup. She will be discharged, follow-up with primary care.  Final Clinical Impressions(s) / ED Diagnoses   Final diagnoses:  Dizziness    New Prescriptions New Prescriptions   No medications on file   I personally performed the services described in this documentation, which was scribed in my presence. The recorded information has been reviewed and is accurate.     Orpah Greek, MD 11/09/16 [phone]

## 2016-11-09 NOTE — ED Triage Notes (Signed)
Pt arrived via [facility] from home c/o dizziness when walking and she sat down on the floor. No injuries but states she has been having right foot soreness when walking

## 2016-11-10 ENCOUNTER — Telehealth: Payer: Self-pay | Admitting: *Deleted

## 2016-11-10 NOTE — Telephone Encounter (Signed)
Patient called to report she went to the ER on 11/09/2016 due to a fall and dizziness. She states she felt like her foot give away. Per Irving Shows, the patient was informed to reduce her Ziac to 1/2 tablet daily and keep a list of her BP and pulse.She should continue to keep a list of her blood sugars and be aware  that Glipizide can cause her sugars to be low. The patient has an OV on Monday,11/14/2016, to recheck her labs, with Irving Shows.

## 2016-11-14 ENCOUNTER — Ambulatory Visit (INDEPENDENT_AMBULATORY_CARE_PROVIDER_SITE_OTHER): Payer: Medicare Other | Admitting: Physician Assistant

## 2016-11-14 ENCOUNTER — Encounter: Payer: Self-pay | Admitting: Physician Assistant

## 2016-11-14 VITALS — BP 128/58 | HR 57 | Temp 97.5°F | Resp 16 | Ht 62.0 in | Wt 121.4 lb

## 2016-11-14 DIAGNOSIS — E1140 Type 2 diabetes mellitus with diabetic neuropathy, unspecified: Secondary | ICD-10-CM

## 2016-11-14 DIAGNOSIS — I10 Essential (primary) hypertension: Secondary | ICD-10-CM

## 2016-11-14 DIAGNOSIS — IMO0002 Reserved for concepts with insufficient information to code with codable children: Secondary | ICD-10-CM

## 2016-11-14 DIAGNOSIS — M79671 Pain in right foot: Secondary | ICD-10-CM

## 2016-11-14 DIAGNOSIS — E538 Deficiency of other specified B group vitamins: Secondary | ICD-10-CM | POA: Diagnosis not present

## 2016-11-14 DIAGNOSIS — D649 Anemia, unspecified: Secondary | ICD-10-CM

## 2016-11-14 DIAGNOSIS — E1365 Other specified diabetes mellitus with hyperglycemia: Secondary | ICD-10-CM

## 2016-11-14 DIAGNOSIS — R42 Dizziness and giddiness: Secondary | ICD-10-CM | POA: Diagnosis not present

## 2016-11-14 DIAGNOSIS — E1322 Other specified diabetes mellitus with diabetic chronic kidney disease: Secondary | ICD-10-CM

## 2016-11-14 DIAGNOSIS — E11319 Type 2 diabetes mellitus with unspecified diabetic retinopathy without macular edema: Secondary | ICD-10-CM | POA: Diagnosis not present

## 2016-11-14 DIAGNOSIS — N183 Chronic kidney disease, stage 3 (moderate): Secondary | ICD-10-CM

## 2016-11-14 LAB — BASIC METABOLIC PANEL WITH GFR
BUN: 38 mg/dL — ABNORMAL HIGH (ref 7–25)
CO2: 27 mmol/L (ref 20–31)
Calcium: 9.6 mg/dL (ref 8.6–10.4)
Chloride: 99 mmol/L (ref 98–110)
Creat: 1.53 mg/dL — ABNORMAL HIGH (ref 0.60–0.88)
GFR, Est African American: 36 mL/min — ABNORMAL LOW (ref >=60)
GFR, Est Non African American: 31 mL/min — ABNORMAL LOW (ref >=60)
Glucose, Bld: 137 mg/dL — ABNORMAL HIGH (ref 65–99)
Potassium: 4.5 mmol/L (ref 3.5–5.3)
Sodium: 136 mmol/L (ref 135–146)

## 2016-11-14 LAB — CBC WITH DIFFERENTIAL/PLATELET
Basophils Absolute: 109 cells/uL (ref 0–200)
Basophils Relative: 1 %
Eosinophils Absolute: 436 cells/uL (ref 15–500)
Eosinophils Relative: 4 %
HCT: 34.9 % — ABNORMAL LOW (ref 35.0–45.0)
Hemoglobin: 11.7 g/dL (ref 11.7–15.5)
Lymphocytes Relative: 34 %
Lymphs Abs: 3706 cells/uL (ref 850–3900)
MCH: 31.4 pg (ref 27.0–33.0)
MCHC: 33.5 g/dL (ref 32.0–36.0)
MCV: 93.6 fL (ref 80.0–100.0)
MPV: 11 fL (ref 7.5–12.5)
Monocytes Absolute: 1090 cells/uL — ABNORMAL HIGH (ref 200–950)
Monocytes Relative: 10 %
Neutro Abs: 5559 cells/uL (ref 1500–7800)
Neutrophils Relative %: 51 %
Platelets: 367 10*3/uL (ref 140–400)
RBC: 3.73 MIL/uL — ABNORMAL LOW (ref 3.80–5.10)
RDW: 13.6 % (ref 11.0–15.0)
WBC: 10.9 10*3/uL — ABNORMAL HIGH (ref 3.8–10.8)

## 2016-11-14 LAB — HEPATIC FUNCTION PANEL
ALT: 11 U/L (ref 6–29)
AST: 17 U/L (ref 10–35)
Albumin: 4.1 g/dL (ref 3.6–5.1)
Alkaline Phosphatase: 50 U/L (ref 33–130)
Bilirubin, Direct: 0.1 mg/dL (ref <=0.2)
Indirect Bilirubin: 0.2 mg/dL (ref 0.2–1.2)
Total Bilirubin: 0.3 mg/dL (ref 0.2–1.2)
Total Protein: 6.7 g/dL (ref 6.1–8.1)

## 2016-11-14 LAB — IRON AND TIBC
%SAT: 24 % (ref 11–50)
Iron: 95 ug/dL (ref 45–160)
TIBC: 396 ug/dL (ref 250–450)
UIBC: 301 ug/dL

## 2016-11-14 NOTE — Progress Notes (Signed)
Assessment and Plan: Essential hypertension - continue medications but cut ziac in half, DASH diet, exercise and monitor at home. Call if greater than 130/80.  -     CBC with Differential/Platelet -     BASIC METABOLIC PANEL WITH GFR -     Hepatic function panel  Uncontrolled secondary diabetes mellitus with stage 3 CKD (GFR 30-59) (HCC) Discussed general issues about diabetes pathophysiology and management., Educational material distributed., Suggested low cholesterol diet., Encouraged aerobic exercise., Discussed foot care., Reminded to get yearly retinal exam.  Controlled type 2 diabetes with neuropathy [facility]) Discussed general issues about diabetes pathophysiology and management., Educational material distributed., Suggested low cholesterol diet., Encouraged aerobic exercise., Discussed foot care., Reminded to get yearly retinal exam. Follow up Dr. Carloyn Manner, declines meds  Diabetic retinopathy associated with type 2 diabetes mellitus, macular edema presence unspecified, unspecified laterality, unspecified retinopathy severity ([place]) Discussed general issues about diabetes pathophysiology and management., Educational material distributed., Suggested low cholesterol diet., Encouraged aerobic exercise., Discussed foot care., Reminded to get yearly retinal exam.  Right foot pain Follow up Dr. Carloyn Manner, declines meds, may benefit from PT, no foot drop in the office.   Dizziness Increase fluids, cut ziac in half, stop glipizide, monitor BP/BS -     CBC with Differential/Platelet -     BASIC METABOLIC PANEL WITH GFR -     Hepatic function panel -     Iron and TIBC -     Vitamin B12  Anemia, unspecified type -     Iron and TIBC -     Vitamin B12   HPI 83 y.o.female  presents for follow up from the ER. Patient went to the ER on 11/09/16, for: dizziness and fall. She had normal CT head other than old ischemic event.  She continues to have dizziness, and states she feels like her foot will give  away.Patient was informed to reduce her Ziac to 1/2 tablet daily and keep a list of her BP and pulse.She should continue to keep a list of her blood sugars and be aware  that Glipizide can cause her sugars to be low.   BS was 108 this AM, BP was 103/59 this AM, Pulse is 59. She has not cut the ziac in half. She states that her right foot felt like it "froze up" on her and "gave way" when she was walking so she fell. She has some pain in her right foot. Has follow up with Dr. Carloyn Manner next week.    Ct Head Wo Contrast  Result Date: 11/09/2016 CLINICAL DATA:  Dizziness EXAM: CT HEAD WITHOUT CONTRAST TECHNIQUE: Contiguous axial images were obtained from the base of the skull through the vertex without intravenous contrast. COMPARISON:  Brain MRI 07/29/2012 FINDINGS: Brain: There is a focus of hypoattenuation in the right frontal white matter that is new compared to the head CT of 07/29/2012. There is no acute hemorrhage. There is mild periventricular hypoattenuation otherwise, but no other focal parenchymal abnormality. No midline shift. No hydrocephalus. Vascular: No hyperdense vessel or unexpected calcification. Skull: Normal visualized skull base, calvarium and extracranial soft tissues. Sinuses/Orbits: No sinus fluid levels or advanced mucosal thickening. No mastoid effusion. Normal orbits. IMPRESSION: 1. New focus of hypoattenuation within the right frontal white matter compared to the remote prior studies of April 2014. This is probably the sequela of an old ischemic event. 2. No acute hemorrhage or other acute abnormality. Electronically Signed   By: Ulyses Jarred M.D.   On: 11/09/2016 02:26  Past Medical History:  Diagnosis Date  . Arthritis   . Chronic back pain   . Diabetic neuropathy ([place])   . Hypercholesteremia   . Hypertension   . Liver lesion   . Neuropathy   . Type II or unspecified type diabetes mellitus without mention of complication, not stated as uncontrolled   . Vitamin D  deficiency   . Wears glasses      Allergies  Allergen Reactions  . Morphine And Related Other (See Comments)    fuzzy feeling      Current Outpatient Prescriptions on File Prior to Visit  Medication Sig Dispense Refill  . ACCU-CHEK AVIVA PLUS test strip Test 3 times a day 300 each 3  . aspirin 81 MG tablet Take 81 mg by mouth daily.    . ASSURE COMFORT LANCETS 30G MISC Check blood sugar 3 times daily-DX-E11.22 300 each 1  . bisoprolol-hydrochlorothiazide (ZIAC) 5-6.25 MG tablet TAKE 1 TABLET BY MOUTH DAILY FOR HIGH BLOOD PRESSURE 90 tablet 1  . Blood Glucose Calibration (ACCU-CHEK AVIVA) SOLN Check blood sugar 3 times daily-DX-E11.22 3 each 1  . Cholecalciferol (VITAMIN D3) 2000 UNITS TABS Take 1 capsule by mouth 2 (two) times daily.    . dorzolamide-timolol (COSOPT) 22.3-6.8 MG/ML ophthalmic solution     . EASY COMFORT INSULIN SYRINGE 30G X 1/2" 0.5 ML MISC     . fenofibrate micronized (LOFIBRA) 134 MG capsule TAKE ONE CAPSULE BY MOUTH  DAILY FOR BLOOD FATS 90 capsule 3  . glipiZIDE (GLUCOTROL) 5 MG tablet Take 1/2 tablet daily with your largest meal.  Do not take this medication if you have not eaten. 30 tablet 11  . LANTUS SOLOSTAR 100 UNIT/ML Solostar Pen     . latanoprost (XALATAN) 0.005 % ophthalmic solution     . LOTEMAX 0.5 % GEL     . Magnesium 500 MG CAPS Take 500 mg by mouth 2 (two) times daily.    . Multiple Vitamins-Minerals (HAIR/SKIN/NAILS) TABS Take by mouth 2 (two) times daily.    . pravastatin (PRAVACHOL) 40 MG tablet TAKE ONE TABLET BY MOUTH AT BEDTIME FOR CHOLESTEROL 90 tablet 1  . rOPINIRole (REQUIP) 3 MG tablet TAKE 1 TABLET BY MOUTH TWICE DAILY AND TAKE TWO TABLETS AT BEDTIME FOR RESTLESS LEGS 120 tablet 1  . traMADol (ULTRAM) 50 MG tablet Take 2 tablets (100 mg total) by mouth every 6 (six) hours as needed. 120 tablet 0   No current facility-administered medications on file prior to visit.     ROS: all negative except above.   Physical Exam: Filed Weights    11/14/16 1410  Weight: 121 lb 6.4 oz (55.1 kg)   BP (!) 128/58   Pulse (!) 57   Temp (!) 97.5 F (36.4 C)   Resp 16   Ht 5\' 2"  (1.575 m)   Wt 121 lb 6.4 oz (55.1 kg)   BMI 22.20 kg/m  General Appearance: Well nourished, in no apparent distress. Eyes: PERRLA, EOMs, conjunctiva no swelling or erythema Sinuses: No Frontal/maxillary tenderness ENT/Mouth: Ext aud canals clear, TMs without erythema, bulging. No erythema, swelling, or exudate on post pharynx.  Tonsils not swollen or erythematous. Hearing normal.  Neck: Supple, thyroid normal.  Respiratory: Respiratory effort normal, BS equal bilaterally without rales, rhonchi, wheezing or stridor.  Cardio: RRR with no MRGs. Brisk peripheral pulses without edema.  Abdomen: Soft, + BS.  Non tender, no guarding, rebound, hernias, masses. Lymphatics: Non tender without lymphadenopathy.  Musculoskeletal: Full ROM, 5/5 strength, normal  gait.  Skin: Warm, dry without rashes, lesions, ecchymosis.  Neuro: Cranial nerves intact. Normal muscle tone, no cerebellar symptoms. Sensation intact.  Psych: Awake and oriented X 3, normal affect, Insight and Judgment appropriate.     Vicie Mutters, PA-C 2:34 PM [facility]

## 2016-11-14 NOTE — Patient Instructions (Addendum)
Stop the glipizide and monitor your blood sugar Cut ziac/bisoprolol in half and monitor your blood pressure Please monitor your blood pressure, as we get older our body can not respond to a low blood pressure as well as it did when we were younger, for this reason we want a bit higher of a blood pressure as you get older to avoid dizziness and fatigue which can lead to falls. [place] call if your blood pressure is consistently above 150/90.    Follow up with Dr. Carloyn Manner.    Dizziness Dizziness is a common problem. It is a feeling of unsteadiness or light-headedness. You may feel like you are about to faint. Dizziness can lead to injury if you stumble or fall. Anyone can become dizzy, but dizziness is more common in older adults. This condition can be caused by a number of things, including medicines, dehydration, or illness. Follow these instructions at home: Taking these steps may help with your condition: Eating and drinking  Drink enough fluid to keep your urine clear or pale yellow. This helps to keep you from becoming dehydrated. Try to drink more clear fluids, such as water.  Do not drink alcohol.  Limit your caffeine intake if directed by your health care provider.  Limit your salt intake if directed by your health care provider. Activity  Avoid making quick movements. ? Rise slowly from chairs and steady yourself until you feel okay. ? In the morning, first sit up on the side of the bed. When you feel okay, stand slowly while you hold onto something until you know that your balance is fine.  Move your legs often if you need to stand in one place for a long time. Tighten and relax your muscles in your legs while you are standing.  Do not drive or operate heavy machinery if you feel dizzy.  Avoid bending down if you feel dizzy. Place items in your home so that they are easy for you to reach without leaning over. Lifestyle  Do not use any tobacco products, including cigarettes,  chewing tobacco, or electronic cigarettes. If you need help quitting, ask your health care provider.  Try to reduce your stress level, such as with yoga or meditation. Talk with your health care provider if you need help. General instructions  Watch your dizziness for any changes.  Take medicines only as directed by your health care provider. Talk with your health care provider if you think that your dizziness is caused by a medicine that you are taking.  Tell a friend or a family member that you are feeling dizzy. If he or she notices any changes in your behavior, have this person call your health care provider.  Keep all follow-up visits as directed by your health care provider. This is important. Contact a health care provider if:  Your dizziness does not go away.  Your dizziness or light-headedness gets worse.  You feel nauseous.  You have reduced hearing.  You have new symptoms.  You are unsteady on your feet or you feel like the room is spinning. Get help right away if:  You vomit or have diarrhea and are unable to eat or drink anything.  You have problems talking, walking, swallowing, or using your arms, hands, or legs.  You feel generally weak.  You are not thinking clearly or you have trouble forming sentences. It may take a friend or family member to notice this.  You have chest pain, abdominal pain, shortness of breath, or sweating.  Your vision changes.  You notice any bleeding.  You have a headache.  You have neck pain or a stiff neck.  You have a fever. This information is not intended to replace advice given to you by your health care provider. Make sure you discuss any questions you have with your health care provider. Document Released: 10/05/2000 Document Revised: 09/17/2015 Document Reviewed: 04/07/2014 Elsevier Interactive Patient Education  2017 [employer].

## 2016-11-15 LAB — VITAMIN B12: Vitamin B-12: 1136 pg/mL — ABNORMAL HIGH (ref 200–1100)

## 2016-11-15 NOTE — Progress Notes (Signed)
LVM for pt to return office call for LAB results.

## 2016-11-15 NOTE — Progress Notes (Signed)
Pt aware of lab results & voiced understanding of those results.

## 2016-11-18 DIAGNOSIS — M461 Sacroiliitis, not elsewhere classified: Secondary | ICD-10-CM | POA: Diagnosis not present

## 2016-11-18 DIAGNOSIS — M47816 Spondylosis without myelopathy or radiculopathy, lumbar region: Secondary | ICD-10-CM | POA: Diagnosis not present

## 2016-11-21 ENCOUNTER — Ambulatory Visit (HOSPITAL_COMMUNITY)
Admission: RE | Admit: 2016-11-21 | Discharge: 2016-11-21 | Disposition: A | Discharge status: Home / Self Care | Payer: Medicare Other | Source: Ambulatory Visit | Attending: Physician Assistant | Admitting: Physician Assistant

## 2016-11-21 ENCOUNTER — Other Ambulatory Visit: Payer: Self-pay | Admitting: Physician Assistant

## 2016-11-21 DIAGNOSIS — M79671 Pain in right foot: Secondary | ICD-10-CM

## 2016-11-21 DIAGNOSIS — S92911A Unspecified fracture of right toe(s), initial encounter for closed fracture: Secondary | ICD-10-CM | POA: Insufficient documentation

## 2016-11-21 DIAGNOSIS — X58XXXA Exposure to other specified factors, initial encounter: Secondary | ICD-10-CM | POA: Diagnosis not present

## 2016-11-21 DIAGNOSIS — S92411A Displaced fracture of proximal phalanx of right great toe, initial encounter for closed fracture: Secondary | ICD-10-CM | POA: Diagnosis not present

## 2016-11-22 ENCOUNTER — Telehealth: Payer: Self-pay

## 2016-11-22 NOTE — Telephone Encounter (Signed)
Foot still hurts & is bruising. Should she go for an x-ray.  [facility] Dr.Roy.

## 2016-11-23 NOTE — Progress Notes (Signed)
Pt aware of lab results & voiced understanding of those results.

## 2016-11-29 DIAGNOSIS — M25571 Pain in right ankle and joints of right foot: Secondary | ICD-10-CM | POA: Diagnosis not present

## 2016-12-12 DIAGNOSIS — H353212 Exudative age-related macular degeneration, right eye, with inactive choroidal neovascularization: Secondary | ICD-10-CM | POA: Diagnosis not present

## 2016-12-12 DIAGNOSIS — H35352 Cystoid macular degeneration, left eye: Secondary | ICD-10-CM | POA: Diagnosis not present

## 2016-12-12 DIAGNOSIS — E119 Type 2 diabetes mellitus without complications: Secondary | ICD-10-CM | POA: Diagnosis not present

## 2016-12-12 DIAGNOSIS — H26492 Other secondary cataract, left eye: Secondary | ICD-10-CM | POA: Diagnosis not present

## 2016-12-12 DIAGNOSIS — H353222 Exudative age-related macular degeneration, left eye, with inactive choroidal neovascularization: Secondary | ICD-10-CM | POA: Diagnosis not present

## 2016-12-14 DIAGNOSIS — H401122 Primary open-angle glaucoma, left eye, moderate stage: Secondary | ICD-10-CM | POA: Diagnosis not present

## 2016-12-14 DIAGNOSIS — H401111 Primary open-angle glaucoma, right eye, mild stage: Secondary | ICD-10-CM | POA: Diagnosis not present

## 2016-12-27 ENCOUNTER — Ambulatory Visit (INDEPENDENT_AMBULATORY_CARE_PROVIDER_SITE_OTHER): Payer: Medicare Other | Admitting: Internal Medicine

## 2016-12-27 ENCOUNTER — Encounter: Payer: Self-pay | Admitting: Internal Medicine

## 2016-12-27 VITALS — BP 130/62 | HR 56 | Temp 97.3°F | Resp 16 | Ht 64.0 in | Wt 121.0 lb

## 2016-12-27 DIAGNOSIS — Z79899 Other long term (current) drug therapy: Secondary | ICD-10-CM

## 2016-12-27 DIAGNOSIS — R1030 Lower abdominal pain, unspecified: Secondary | ICD-10-CM | POA: Diagnosis not present

## 2016-12-27 DIAGNOSIS — R531 Weakness: Secondary | ICD-10-CM | POA: Diagnosis not present

## 2016-12-27 NOTE — Progress Notes (Signed)
[facility]   [name], M.D.    [name]. Silverio Lay, P.A.-C [name]                [address] [place] [address] Telephone [phone] Telefax [phone]  Subjective:    Patient ID: [name], female    DOB: 03-15-1930, 83 y.o.   MRN: [id number]  HPI  This very nice 83 yo MWF with HTN, HLD, Insulin Req T2_IDDM w/ CKD4 presents for evaluation with c/o generalized weakness,/ postural light-headedness, loss of appetite, 1 episode of N/V- emesis 4-5 nights ago. Denies HB/Reflux. Describes lower midline abdominal discomfort not affected correlated with , meals/eating or D/C. Denies Urgency, frequency or dysuria. Husband feels that "She's just depressed".   Denies Respiratory sx's.   Medication Sig  . aspirin 81 MG tablet Take 81 mg by mouth daily.  . bisoprolol-hydrochlorothiazide (ZIAC) 5-6.25 MG tablet TAKE 1 TABLET BY MOUTH DAILY FOR HIGH BLOOD PRESSURE  . Cholecalciferol (VITAMIN D3) 2000 UNITS TABS Take 1 capsule by mouth 2 (two) times daily.  . dorzolamide-timolol (COSOPT) 22.3-6.8 MG/ML ophthalmic solution   . Fenofibrate 134 MG capsule TAKE ONE CAPSULE BY MOUTH  DAILY FOR BLOOD FATS  . glipiZIDE  5 MG tablet Take 1/2 tablet daily with your largest meal.    . LANTUS SOLOSTAR 100 UNIT/ML Solostar Pen   . XALATAN 0.005 % ophth soln   . LOTEMAX 0.5 % GEL   . Magnesium 500 MG CAPS Take 500 mg by mouth 2 (two) times daily.  Marland Kitchen HAIR/SKIN/NAILS Take by mouth 2 (two) times daily.  . pravastatin (PRAVACHOL) 40 MG tablet TAKE ONE TABLET BY MOUTH AT BEDTIME FOR CHOLESTEROL  . rOPINIRole 3 MG tablet TAKE 1 TABLET BY MOUTH TWICE DAILY AND TAKE TWO TABLETS AT BEDTIME FOR RESTLESS LEGS  . traMADol (ULTRAM) 50 MG tablet Take 2 tab every 6 (six) hours as needed.    Allergies  Allergen Reactions  . Morphine And Related Other (See Comments)    fuzzy feeling   Past Medical History:   Diagnosis Date  . Arthritis   . Chronic back pain   . Diabetic neuropathy ([place])   . Hypercholesteremia   . Hypertension   . Liver lesion   . Neuropathy   . Type II or unspecified type diabetes mellitus without mention of complication, not stated as uncontrolled   . Vitamin D deficiency   . Wears glasses    Past Surgical History:  Procedure Laterality Date  . ABDOMINAL HYSTERECTOMY    . APPENDECTOMY    . BACK SURGERY  1993   lumb lam-fusion  . CESAREAN SECTION     x 3   . COLONOSCOPY    . EYE SURGERY     both cataracts  . TRIGGER FINGER RELEASE  01/10/2012   Procedure: RELEASE TRIGGER FINGER/A-1 PULLEY;  Surgeon: Hessie Dibble, MD;  Location: [facility];  Service: Orthopedics;  Laterality: Right;  right ring trigger release   Review of Systems  10 point systems review negative except as above.    Objective:   Physical Exam  BP 130/62   Pulse (!) 56   Temp (!) 97.3 F (36.3 C)   Resp 16   Ht 5\' 4"  (1.626 m)   Wt 121 lb (54.9 kg)   BMI 20.77 kg/m   HEENT - Eac's patent.  TM's Nl. EOM's full. PERRLA. NasoOroPharynx clear. Neck - supple. Nl Thyroid. Carotids 2+ & No bruits, nodes, JVD Chest - Clear equal BS w/o Rales, rhonchi, wheezes. Cor - Nl HS. RRR w/o sig MGR. PP 1(+). No edema. Abd - Soft with lowe midline suprapubic tenderness - MS- FROM w/o deformities. Muscle power, tone and bulk Nl. Gait Nl. Neuro - No obvious Cr N abnormalities. Nl w/o focal abnormalities. Psyche - Mental status fat affect  Assessment & Plan:   1. Weakness  - BASIC METABOLIC PANEL WITH GFR - Hepatic function panel  2. Lower abdominal pain  - Urinalysis w microscopic + reflex culture - Urine Culture  3. Medication management  - BASIC METABOLIC PANEL WITH GFR - Hepatic function panel

## 2016-12-28 LAB — URINE CULTURE
MICRO NUMBER:: 80966503
Result:: NO GROWTH
SPECIMEN QUALITY:: ADEQUATE

## 2016-12-30 ENCOUNTER — Other Ambulatory Visit: Payer: Self-pay | Admitting: *Deleted

## 2016-12-30 MED ORDER — AMOXICILLIN 250 MG PO CAPS: 250.0000 mg | ORAL_CAPSULE | Freq: Three times a day (TID) | ORAL | 0 refills | Status: DC

## 2017-01-01 ENCOUNTER — Other Ambulatory Visit: Payer: Self-pay | Admitting: Internal Medicine

## 2017-01-01 DIAGNOSIS — N390 Urinary tract infection, site not specified: Secondary | ICD-10-CM

## 2017-01-01 LAB — URINE CULTURE
MICRO NUMBER:: 80972370
SPECIMEN QUALITY:: ADEQUATE

## 2017-01-01 LAB — BASIC METABOLIC PANEL WITH GFR
BUN/Creatinine Ratio: 14 (calc) (ref 6–22)
BUN: 20 mg/dL (ref 7–25)
CO2: 31 mmol/L (ref 20–32)
Calcium: 10.1 mg/dL (ref 8.6–10.4)
Chloride: 98 mmol/L (ref 98–110)
Creat: 1.45 mg/dL — ABNORMAL HIGH (ref 0.60–0.88)
GFR, Est African American: 38 mL/min/{1.73_m2} — ABNORMAL LOW (ref >=60)
GFR, Est Non African American: 33 mL/min/{1.73_m2} — ABNORMAL LOW (ref >=60)
Glucose, Bld: 199 mg/dL — ABNORMAL HIGH (ref 65–99)
Potassium: 4.4 mmol/L (ref 3.5–5.3)
Sodium: 137 mmol/L (ref 135–146)

## 2017-01-01 LAB — URINALYSIS W MICROSCOPIC + REFLEX CULTURE
Bacteria, UA: NONE SEEN /HPF
Bilirubin Urine: NEGATIVE
Glucose, UA: NEGATIVE
Hgb urine dipstick: NEGATIVE
Hyaline Cast: NONE SEEN /LPF
Ketones, ur: NEGATIVE
Nitrites, Initial: NEGATIVE
Protein, ur: NEGATIVE
RBC / HPF: NONE SEEN /HPF (ref 0–2)
Specific Gravity, Urine: 1.013 (ref 1.001–1.03)
Squamous Epithelial / LPF: NONE SEEN /HPF (ref <=5)
WBC, UA: NONE SEEN /HPF (ref 0–5)
pH: 6.5 (ref 5.0–8.0)

## 2017-01-01 LAB — HEPATIC FUNCTION PANEL
AG Ratio: 1.5 (calc) (ref 1.0–2.5)
ALT: 10 U/L (ref 6–29)
AST: 16 U/L (ref 10–35)
Albumin: 4.1 g/dL (ref 3.6–5.1)
Alkaline phosphatase (APISO): 56 U/L (ref 33–130)
Bilirubin, Direct: 0.1 mg/dL (ref 0.0–0.2)
Globulin: 2.8 g/dL (calc) (ref 1.9–3.7)
Indirect Bilirubin: 0.4 mg/dL (calc) (ref 0.2–1.2)
Total Bilirubin: 0.5 mg/dL (ref 0.2–1.2)
Total Protein: 6.9 g/dL (ref 6.1–8.1)

## 2017-01-01 LAB — CULTURE INDICATED

## 2017-01-01 LAB — EXTRA LAV TOP TUBE

## 2017-01-16 ENCOUNTER — Other Ambulatory Visit: Payer: Self-pay | Admitting: Physician Assistant

## 2017-01-30 ENCOUNTER — Ambulatory Visit (INDEPENDENT_AMBULATORY_CARE_PROVIDER_SITE_OTHER): Payer: Medicare Other | Admitting: *Deleted

## 2017-01-30 DIAGNOSIS — N390 Urinary tract infection, site not specified: Secondary | ICD-10-CM | POA: Diagnosis not present

## 2017-01-30 DIAGNOSIS — Z23 Encounter for immunization: Secondary | ICD-10-CM | POA: Diagnosis not present

## 2017-01-30 NOTE — Progress Notes (Signed)
Patient is here for a NV to recheck a UA and urine culture.  Patient has finished her Amoxicillin and is having no UTI symptoms.  Her weight is 120.0 lb. She received her HD Flu vaccine today in her in her left deltoid.

## 2017-02-01 LAB — CULTURE INDICATED

## 2017-02-01 LAB — URINALYSIS W MICROSCOPIC + REFLEX CULTURE
Bacteria, UA: NONE SEEN /HPF
Bilirubin Urine: NEGATIVE
Glucose, UA: NEGATIVE
Hgb urine dipstick: NEGATIVE
Nitrites, Initial: NEGATIVE
Protein, ur: NEGATIVE
RBC / HPF: NONE SEEN /HPF (ref 0–2)
Specific Gravity, Urine: 1.02 (ref 1.001–1.03)
Squamous Epithelial / LPF: NONE SEEN /HPF (ref <=5)
WBC, UA: NONE SEEN /HPF (ref 0–5)
pH: <=5 (ref 5.0–8.0)

## 2017-02-01 LAB — URINE CULTURE
MICRO NUMBER:: 81122763
SPECIMEN QUALITY:: ADEQUATE

## 2017-02-06 DIAGNOSIS — H353211 Exudative age-related macular degeneration, right eye, with active choroidal neovascularization: Secondary | ICD-10-CM | POA: Diagnosis not present

## 2017-02-06 DIAGNOSIS — E119 Type 2 diabetes mellitus without complications: Secondary | ICD-10-CM | POA: Diagnosis not present

## 2017-02-06 DIAGNOSIS — H353222 Exudative age-related macular degeneration, left eye, with inactive choroidal neovascularization: Secondary | ICD-10-CM | POA: Diagnosis not present

## 2017-02-06 DIAGNOSIS — H353123 Nonexudative age-related macular degeneration, left eye, advanced atrophic without subfoveal involvement: Secondary | ICD-10-CM | POA: Diagnosis not present

## 2017-02-06 DIAGNOSIS — H35351 Cystoid macular degeneration, right eye: Secondary | ICD-10-CM | POA: Diagnosis not present

## 2017-02-09 DIAGNOSIS — E118 Type 2 diabetes mellitus with unspecified complications: Secondary | ICD-10-CM | POA: Insufficient documentation

## 2017-02-09 DIAGNOSIS — Z794 Long term (current) use of insulin: Secondary | ICD-10-CM | POA: Insufficient documentation

## 2017-02-09 NOTE — Progress Notes (Signed)
Assessment and Plan:  [name] was seen today for skin problem.  Diagnoses and all orders for this visit:  Cellulitis of left lower extremity -     triamcinolone ointment (KENALOG) 0.1 %; Apply 1 application topically 2 (two) times daily. -     cephALEXin (KEFLEX) 250 MG capsule; Take 1 capsule (250 mg total) by mouth 3 (three) times daily. -      Follow up in 10-14 days  Type 2 diabetes mellitus with complication, with long-term current use of insulin (HCC)       -      Discussed sliding scale for improved control; scale chart provided.  Further disposition pending results of labs. Discussed med's effects and SE's.   Over 20 minutes of exam, counseling, chart review, and critical decision making was performed.   Future Appointments Date Time Provider [place]  02/17/2017 11:00 AM Liane Comber, NP [facility] None  02/27/2017 11:00 AM Vicie Mutters, PA-C [facility] None  06/05/2017 10:00 AM [name], MD [facility] None    ------------------------------------------------------------------------------------------------------------------   HPI BP 130/60   Pulse (!) 51   Temp (!) 96.3 F (35.7 C)   Ht 5\' 4"  (1.626 m)   Wt 121 lb (54.9 kg)   SpO2 97%   BMI 20.77 kg/m   83 y.o.female with a history of T2DM treated with insulin with accompanying neuropathy presents for evaluation of a concerning spot on her left anterior lower leg. She reports this began 2-3 months ago, and has been gradually increasing in diameter, and is now somewhat tender- area appears as an approximately 2 x 3cm, injected, erythematous area with a lichenified center. Not fluctuant/abcessed, patient denies discharge from the area, but states it is tender to palpation. She demonstrates a small thickened/lichenified/scaly area to right upper inner thigh as well without injection; she reports area is somewhat pruritic.   She reports her BGLs were running 190s then readjusted meter and has come down  to 150s. She will continue to adjust with sliding scale provided.   Past Medical History:  Diagnosis Date  . Arthritis   . Chronic back pain   . Diabetic neuropathy ([place])   . Hypercholesteremia   . Hypertension   . Liver lesion   . Neuropathy   . Type II or unspecified type diabetes mellitus without mention of complication, not stated as uncontrolled   . Vitamin D deficiency   . Wears glasses      Allergies  Allergen Reactions  . Morphine And Related Other (See Comments)    fuzzy feeling    Current Outpatient Prescriptions on File Prior to Visit  Medication Sig  . ACCU-CHEK AVIVA PLUS test strip Test 3 times a day  . aspirin 81 MG tablet Take 81 mg by mouth daily.  . ASSURE COMFORT LANCETS 30G MISC Check blood sugar 3 times daily-DX-E11.22  . bisoprolol-hydrochlorothiazide (ZIAC) 5-6.25 MG tablet TAKE 1 TABLET BY MOUTH DAILY FOR HIGH BLOOD PRESSURE  . Blood Glucose Calibration (ACCU-CHEK AVIVA) SOLN Check blood sugar 3 times daily-DX-E11.22  . Cholecalciferol (VITAMIN D3) 2000 UNITS TABS Take 1 capsule by mouth 2 (two) times daily.  . dorzolamide-timolol (COSOPT) 22.3-6.8 MG/ML ophthalmic solution   . EASY COMFORT INSULIN SYRINGE 30G X 1/2" 0.5 ML MISC   . fenofibrate micronized (LOFIBRA) 134 MG capsule TAKE ONE CAPSULE BY MOUTH  DAILY FOR BLOOD FATS  . LANTUS SOLOSTAR 100 UNIT/ML Solostar Pen INJECT 30 UNITS INTO THE SKIN AT BEDTIME OR AS DIRECTED TO CONTROL BLOOD SUGARS  .  latanoprost (XALATAN) 0.005 % ophthalmic solution   . Magnesium 500 MG CAPS Take 500 mg by mouth 2 (two) times daily.  . Multiple Vitamins-Minerals (HAIR/SKIN/NAILS) TABS Take by mouth 2 (two) times daily.  . pravastatin (PRAVACHOL) 40 MG tablet TAKE ONE TABLET BY MOUTH AT BEDTIME FOR CHOLESTEROL  . traMADol (ULTRAM) 50 MG tablet Take 2 tablets (100 mg total) by mouth every 6 (six) hours as needed.  Marland Kitchen glipiZIDE (GLUCOTROL) 5 MG tablet Take 1/2 tablet daily with your largest meal.  Do not take this  medication if you have not eaten.  Marland Kitchen LOTEMAX 0.5 % GEL   . rOPINIRole (REQUIP) 3 MG tablet TAKE 1 TABLET BY MOUTH TWICE DAILY AND TAKE TWO TABLETS AT BEDTIME FOR RESTLESS LEGS (Patient not taking: Reported on 02/10/2017)   No current facility-administered medications on file prior to visit.     ROS: all negative except above.   Physical Exam:  BP 130/60   Pulse (!) 51   Temp (!) 96.3 F (35.7 C)   Ht 5\' 4"  (1.626 m)   Wt 121 lb (54.9 kg)   SpO2 97%   BMI 20.77 kg/m   General Appearance: Well nourished, in no apparent distress. Neck: Supple.  Respiratory: Respiratory effort normal, BS equal bilaterally without rales, rhonchi, wheezing or stridor.  Cardio: RRR with no MRGs. 1+ symmetrical distal pulses without edema.  Abdomen: Soft, + BS.  Non tender, no guarding, rebound, hernias, masses. Lymphatics: Non tender without lymphadenopathy.  Musculoskeletal:  normal gait.  Skin: Warm, dry-  Area on L anterior lower leg approximately 2 x 3cm, injected, erythematous area with a lichenified center. Not fluctuant/abcessed, tender to palpation. Small round thickened/lichenified/scaly area to right upper inner thigh as well without injection or tenderness.  Psych: Awake and oriented X 3, normal affect, Insight and Judgment appropriate.     Izora Ribas, NP 10:47 AM [name]

## 2017-02-10 ENCOUNTER — Encounter: Payer: Self-pay | Admitting: Adult Health

## 2017-02-10 ENCOUNTER — Ambulatory Visit (INDEPENDENT_AMBULATORY_CARE_PROVIDER_SITE_OTHER): Payer: Medicare Other | Admitting: Adult Health

## 2017-02-10 VITALS — BP 130/60 | HR 51 | Temp 96.3°F | Ht 64.0 in | Wt 121.0 lb

## 2017-02-10 DIAGNOSIS — Z794 Long term (current) use of insulin: Secondary | ICD-10-CM | POA: Diagnosis not present

## 2017-02-10 DIAGNOSIS — E118 Type 2 diabetes mellitus with unspecified complications: Secondary | ICD-10-CM | POA: Diagnosis not present

## 2017-02-10 DIAGNOSIS — L03116 Cellulitis of left lower limb: Secondary | ICD-10-CM | POA: Diagnosis not present

## 2017-02-10 MED ORDER — TRIAMCINOLONE ACETONIDE 0.1 % EX OINT: 1.0000 "application " | TOPICAL_OINTMENT | Freq: Two times a day (BID) | CUTANEOUS | 1 refills | Status: DC

## 2017-02-10 MED ORDER — CEPHALEXIN 250 MG PO CAPS: 250.0000 mg | ORAL_CAPSULE | Freq: Three times a day (TID) | ORAL | 0 refills | Status: AC

## 2017-02-10 NOTE — Patient Instructions (Addendum)
Take a probiotic supplement for 1-2 months after taking the antibiotic. We will follow up in 10-14 day. Please call us if the area is getting worse and not better in the meantime.    Here is sliding scale for your meal time insulin. This will help control the spikes in sugar depending on what you are eating. Please remember only take the insulin WITH food, if you are sick or unable to eat DO NOT take your insulin. Also a low blood sugar is much more dangerous than a high blood sugar. Your brain needs 2 things, oxygen and sugar, so lets make sure it gets both. If at any time you have a question or concern, call the office or message [place] in [place].   Sugar Insulin  <70  NONE  70-130 None  131-180 1 units  181-240 2 unit  241-300 3 units  301-350 4 units  351-400 5 units and call the doctor        Cephalexin tablets or capsules What is this medicine? CEPHALEXIN (sef a LEX in) is a cephalosporin antibiotic. It is used to treat certain kinds of bacterial infections It will not work for colds, flu, or other viral infections. This medicine may be used for other purposes; ask your health care provider or pharmacist if you have questions. COMMON BRAND NAME(S): Biocef, Daxbia, Keflex, Keftab What should I tell my health care provider before I take this medicine? They need to know if you have any of these conditions: -kidney disease -stomach or intestine problems, especially colitis -an unusual or allergic reaction to cephalexin, other cephalosporins, penicillins, other antibiotics, medicines, foods, dyes or preservatives -pregnant or trying to get pregnant -breast-feeding How should I use this medicine? Take this medicine by mouth with a full glass of water. Follow the directions on the prescription label. This medicine can be taken with or without food. Take your medicine at regular intervals. Do not take your medicine more often than directed. Take all of your medicine as directed even if you  think you are better. Do not skip doses or stop your medicine early. Talk to your pediatrician regarding the use of this medicine in children. While this drug may be prescribed for selected conditions, precautions do apply. Overdosage: If you think you have taken too much of this medicine contact a poison control center or emergency room at once. NOTE: This medicine is only for you. Do not share this medicine with others. What if I miss a dose? If you miss a dose, take it as soon as you can. If it is almost time for your next dose, take only that dose. Do not take double or extra doses. There should be at least 4 to 6 hours between doses. What may interact with this medicine? -probenecid -some other antibiotics This list may not describe all possible interactions. Give your health care provider a list of all the medicines, herbs, non-prescription drugs, or dietary supplements you use. Also tell them if you smoke, drink alcohol, or use illegal drugs. Some items may interact with your medicine. What should I watch for while using this medicine? Tell your doctor or health care professional if your symptoms do not begin to improve in a few days. Do not treat diarrhea with over the counter products. Contact your doctor if you have diarrhea that lasts more than 2 days or if it is severe and watery. If you have diabetes, you may get a false-positive result for sugar in your urine. Check with  your doctor or health care professional. What side effects may I notice from receiving this medicine? Side effects that you should report to your doctor or health care professional as soon as possible: -allergic reactions like skin rash, itching or hives, swelling of the face, lips, or tongue -breathing problems -pain or trouble passing urine -redness, blistering, peeling or loosening of the skin, including inside the mouth -severe or watery diarrhea -unusually weak or tired -yellowing of the eyes, skin Side  effects that usually do not require medical attention (report to your doctor or health care professional if they continue or are bothersome): -gas or heartburn -genital or anal irritation -headache -joint or muscle pain -nausea, vomiting This list may not describe all possible side effects. Call your doctor for medical advice about side effects. You may report side effects to FDA at 1-800-FDA-1088. Where should I keep my medicine? Keep out of the reach of children. Store at room temperature between 59 and 86 degrees F (15 and 30 degrees C). Throw away any unused medicine after the expiration date. NOTE: This sheet is a summary. It may not cover all possible information. If you have questions about this medicine, talk to your doctor, pharmacist, or health care provider.  2018 Elsevier/Gold Standard (2007-07-16 17:09:13)

## 2017-02-17 ENCOUNTER — Ambulatory Visit (INDEPENDENT_AMBULATORY_CARE_PROVIDER_SITE_OTHER): Payer: Medicare Other | Admitting: Adult Health

## 2017-02-17 ENCOUNTER — Encounter: Payer: Self-pay | Admitting: Adult Health

## 2017-02-17 VITALS — BP 138/62 | HR 58 | Temp 97.3°F | Ht 64.0 in | Wt 120.0 lb

## 2017-02-17 DIAGNOSIS — Z794 Long term (current) use of insulin: Secondary | ICD-10-CM | POA: Diagnosis not present

## 2017-02-17 DIAGNOSIS — L03116 Cellulitis of left lower limb: Secondary | ICD-10-CM

## 2017-02-17 DIAGNOSIS — E118 Type 2 diabetes mellitus with unspecified complications: Secondary | ICD-10-CM

## 2017-02-17 NOTE — Patient Instructions (Signed)

## 2017-02-17 NOTE — Progress Notes (Signed)
Assessment and Plan:  Esterlene was seen today for skin problem.  Diagnoses and all orders for this visit:  Cellulitis of left lower extremity Continue prescribed medications: -     triamcinolone ointment (KENALOG) 0.1 %; Apply 1 application topically 2 (two) times daily. -     cephALEXin (KEFLEX) 250 MG capsule; Take 1 capsule (250 mg total) by mouth 3 (three) times daily. Monitor injected area by marking; if area is not beginning to improve at the follow up with completion of antibiotic course may consider alternate agent.  Discussed anticipated long course of healing/treatment until resolution secondary to peripheral vascular disease  Type 2 diabetes mellitus with complication, with long-term current use of insulin (HCC)       -      Discussed dietary choices at length, and that she needs to avoid white carbs (sugar, flour, white potatoes) for improved control of her blood sugars. She did not present with a log today- instructed for her to do so at her follow up visit, and to bring her meter for help with calibration.   Further disposition pending results of labs. Discussed med's effects and SE's.   Over 20 minutes of exam, counseling, chart review, and critical decision making was performed.   Future Appointments Date Time Provider [place]  02/27/2017 11:00 AM Vicie Mutters, PA-C [facility] None  06/05/2017 10:00 AM [name], MD [facility] None    ------------------------------------------------------------------------------------------------------------------   HPI BP 138/62   Pulse (!) 58   Temp (!) 97.3 F (36.3 C)   Ht 5\' 4"  (1.626 m)   Wt 120 lb (54.4 kg)   SpO2 97%   BMI 20.60 kg/m   83 y.o.female with a history of T2DM treated with insulin with accompanying neuropathy presents accompanied by her husband for follow up for a possibly infected lesion on her left anterior lower leg ongoing for 2-3 months for which we started keflex 1 week ago.  Area appears  very similar to last visit with a approx 2cm round injected area with a lichenified center. Not fluctuant/abcessed, patient denies discharge from the area, but states remains tender to palpation. The area to right upper inner thigh appears much improved with application of triamcinolone and is no longer pruritic. She still has 5 days left to course of keflex.   Her sugars were reportedly running in the 190s at last visit, then 150s with adjustment of meter. She reports they had been running lower in the 140s after reproviding her an insulin scale to refer to, but she reports 284 this morning - she reports she had country steak and creamed potatoes last night with an ice cream sandwhich for dessert. Discussed simple carbohydrates at length, and emphasized she needs to avoid these as much as possible, and not eat several poor options in 1 meal. The patient will bring a sugar log at next visit in a few weeks as well as her glucose meter for assistance with calibration.   Past Medical History:  Diagnosis Date  . Arthritis   . Chronic back pain   . Diabetic neuropathy ([place])   . Hypercholesteremia   . Hypertension   . Liver lesion   . Neuropathy   . Type II or unspecified type diabetes mellitus without mention of complication, not stated as uncontrolled   . Vitamin D deficiency   . Wears glasses      Allergies  Allergen Reactions  . Morphine And Related Other (See Comments)    fuzzy feeling  Current Outpatient Prescriptions on File Prior to Visit  Medication Sig  . ACCU-CHEK AVIVA PLUS test strip Test 3 times a day  . aspirin 81 MG tablet Take 81 mg by mouth daily.  . ASSURE COMFORT LANCETS 30G MISC Check blood sugar 3 times daily-DX-E11.22  . bisoprolol-hydrochlorothiazide (ZIAC) 5-6.25 MG tablet TAKE 1 TABLET BY MOUTH DAILY FOR HIGH BLOOD PRESSURE  . Blood Glucose Calibration (ACCU-CHEK AVIVA) SOLN Check blood sugar 3 times daily-DX-E11.22  . cephALEXin (KEFLEX) 250 MG capsule Take 1  capsule (250 mg total) by mouth 3 (three) times daily.  . Cholecalciferol (VITAMIN D3) 2000 UNITS TABS Take 1 capsule by mouth 2 (two) times daily.  . dorzolamide-timolol (COSOPT) 22.3-6.8 MG/ML ophthalmic solution   . EASY COMFORT INSULIN SYRINGE 30G X 1/2" 0.5 ML MISC   . fenofibrate micronized (LOFIBRA) 134 MG capsule TAKE ONE CAPSULE BY MOUTH  DAILY FOR BLOOD FATS  . LANTUS SOLOSTAR 100 UNIT/ML Solostar Pen INJECT 30 UNITS INTO THE SKIN AT BEDTIME OR AS DIRECTED TO CONTROL BLOOD SUGARS  . latanoprost (XALATAN) 0.005 % ophthalmic solution   . LOTEMAX 0.5 % GEL   . Magnesium 500 MG CAPS Take 500 mg by mouth 2 (two) times daily.  . Multiple Vitamins-Minerals (HAIR/SKIN/NAILS) TABS Take by mouth 2 (two) times daily.  . pravastatin (PRAVACHOL) 40 MG tablet TAKE ONE TABLET BY MOUTH AT BEDTIME FOR CHOLESTEROL  . rOPINIRole (REQUIP) 3 MG tablet TAKE 1 TABLET BY MOUTH TWICE DAILY AND TAKE TWO TABLETS AT BEDTIME FOR RESTLESS LEGS  . traMADol (ULTRAM) 50 MG tablet Take 2 tablets (100 mg total) by mouth every 6 (six) hours as needed.  . triamcinolone ointment (KENALOG) 0.1 % Apply 1 application topically 2 (two) times daily.  Marland Kitchen glipiZIDE (GLUCOTROL) 5 MG tablet Take 1/2 tablet daily with your largest meal.  Do not take this medication if you have not eaten.   No current facility-administered medications on file prior to visit.     ROS: Review of Systems  Constitutional: Negative for chills, diaphoresis and fever.  HENT: Negative.   Eyes: Negative.  Negative for blurred vision.  Respiratory: Negative for cough, shortness of breath and wheezing.   Cardiovascular: Negative for chest pain, palpitations, orthopnea, claudication and leg swelling.  Gastrointestinal: Negative for diarrhea, nausea and vomiting.  Genitourinary: Negative.  Negative for frequency.  Musculoskeletal: Negative for falls, joint pain and myalgias.  Skin: Negative for itching and rash.  Neurological: Negative for dizziness,  tingling, sensory change, weakness and headaches.  Endo/Heme/Allergies: Negative for polydipsia.  Psychiatric/Behavioral: Negative.   All other systems reviewed and are negative.    Physical Exam:  BP 138/62   Pulse (!) 58   Temp (!) 97.3 F (36.3 C)   Ht 5\' 4"  (1.626 m)   Wt 120 lb (54.4 kg)   SpO2 97%   BMI 20.60 kg/m   General Appearance: Well nourished, in no apparent distress. Neck: Supple.  Respiratory: Respiratory effort normal, BS equal bilaterally without rales, rhonchi, wheezing or stridor.  Cardio: RRR with no MRGs. 1+ symmetrical distal pulses without edema, skin taut and shiny over shins Abdomen: Soft, + BS.  Non tender, no guarding, rebound, hernias, masses. Lymphatics: Non tender without lymphadenopathy.  Musculoskeletal:  normal gait.  Skin: Warm, dry-  Area on L anterior lower leg approximately 2 x 3cm, injected, erythematous area with a lichenified center. Not fluctuant/abcessed, tender to palpation.  Psych: Awake and oriented X 3, normal affect, Insight and Judgment appropriate.    Caryl Pina  Talmadge Coventry, NP 12:36 PM [facility]

## 2017-02-20 ENCOUNTER — Other Ambulatory Visit: Payer: Self-pay | Admitting: Neurosurgery

## 2017-02-20 DIAGNOSIS — M47816 Spondylosis without myelopathy or radiculopathy, lumbar region: Secondary | ICD-10-CM

## 2017-02-21 ENCOUNTER — Other Ambulatory Visit: Payer: Self-pay | Admitting: Internal Medicine

## 2017-02-24 ENCOUNTER — Ambulatory Visit
Admission: RE | Admit: 2017-02-24 | Discharge: 2017-02-24 | Disposition: A | Discharge status: Home / Self Care | Payer: Medicare Other | Source: Ambulatory Visit | Attending: Neurosurgery | Admitting: Neurosurgery

## 2017-02-24 DIAGNOSIS — M47816 Spondylosis without myelopathy or radiculopathy, lumbar region: Secondary | ICD-10-CM

## 2017-02-24 DIAGNOSIS — M47817 Spondylosis without myelopathy or radiculopathy, lumbosacral region: Secondary | ICD-10-CM | POA: Diagnosis not present

## 2017-02-24 MED ORDER — IOPAMIDOL (ISOVUE-M 200) INJECTION 41%
1.0000 mL | Freq: Once | INTRAMUSCULAR | Status: AC
  Administered 2017-02-24: 1 mL via EPIDURAL

## 2017-02-24 MED ORDER — METHYLPREDNISOLONE ACETATE 40 MG/ML INJ SUSP (RADIOLOG
120.0000 mg | Freq: Once | INTRAMUSCULAR | Status: AC
  Administered 2017-02-24: 120 mg via EPIDURAL

## 2017-02-24 NOTE — Discharge Instructions (Signed)

## 2017-02-24 NOTE — Progress Notes (Signed)
Assessment and Plan:  Essential hypertension - continue medications, DASH diet, exercise and monitor at home. Call if greater than 130/80.  -     CBC with Differential/Platelet -     BASIC METABOLIC PANEL WITH GFR -     Hepatic function panel -     TSH  Atherosclerosis of aorta (HCC) Control blood pressure, cholesterol, glucose, increase exercise.   Type 2 diabetes mellitus with complication, with long-term current use of insulin (HCC) Will increase lantus to 16 units, adjusted sliding scale, give to patient and husband.  -     Blood Glucose Monitoring Suppl (ACCU-CHEK AVIVA) device; Use as instructed -     insulin lispro (HUMALOG) 100 UNIT/ML injection; Inject 0.05-0.1 mLs (5-10 Units total) 3 (three) times daily with meals into the skin. Patient takes Rx on a sliding scale basis -     metFORMIN (GLUCOPHAGE) 1000 MG tablet; TAKE ONE TABLET TWO TIMES A DAY AFTER MEALS FOR DIABETES -     Hemoglobin A1c  Neuropathy due to type 2 diabetes mellitus (HCC) Will increase lantus to 16 units, adjusted sliding scale, give to patient and husband. -     Blood Glucose Monitoring Suppl (ACCU-CHEK AVIVA) device; Use as instructed -     insulin lispro (HUMALOG) 100 UNIT/ML injection; Inject 0.05-0.1 mLs (5-10 Units total) 3 (three) times daily with meals into the skin. Patient takes Rx on a sliding scale basis -     metFORMIN (GLUCOPHAGE) 1000 MG tablet; TAKE ONE TABLET TWO TIMES A DAY AFTER MEALS FOR DIABETES -     Hemoglobin A1c  Diabetic retinopathy associated with type 2 diabetes mellitus, macular edema presence unspecified, unspecified laterality, unspecified retinopathy severity (HCC) Will increase lantus to 16 units, adjusted sliding scale, give to patient and husband. Continue yearly visits -     Blood Glucose Monitoring Suppl (ACCU-CHEK AVIVA) device; Use as instructed -     insulin lispro (HUMALOG) 100 UNIT/ML injection; Inject 0.05-0.1 mLs (5-10 Units total) 3 (three) times daily with meals  into the skin. Patient takes Rx on a sliding scale basis -     metFORMIN (GLUCOPHAGE) 1000 MG tablet; TAKE ONE TABLET TWO TIMES A DAY AFTER MEALS FOR DIABETES -     Hemoglobin A1c  CKD stage 3 due to type 2 diabetes mellitus (HCC) -     Blood Glucose Monitoring Suppl (ACCU-CHEK AVIVA) device; Use as instructed -     insulin lispro (HUMALOG) 100 UNIT/ML injection; Inject 0.05-0.1 mLs (5-10 Units total) 3 (three) times daily with meals into the skin. Patient takes Rx on a sliding scale basis -     metFORMIN (GLUCOPHAGE) 1000 MG tablet; TAKE ONE TABLET TWO TIMES A DAY AFTER MEALS FOR DIABETES -     Hemoglobin A1c  Mixed hyperlipidemia -continue medications, check lipids, decrease fatty foods, increase activity.  -     Lipid panel  Medication management -     Magnesium  Erythematous skin nodule X 3 months, no signs of cellulitis at this time, rule out malignancy  Will refer to derm/skin surgery center for removal -     Ambulatory referral to Dermatology    HPI 83 y.o.female  presents for follow up for diabetes, chol, HTN.  Her blood pressure has been controlled at home, today their BP is BP: 126/76  She does not workout. She denies chest pain, shortness of breath, dizziness. Has 2 Cm x 1.5 cm nodule getting larger on left leg, erythematous and poor borders, will refer  to dermatology for evaluation/possible removal.   She is on cholesterol medication and denies myalgias. Her cholesterol is at goal. The cholesterol last visit was:   Lab Results  Component Value Date   CHOL 157 11/07/2016   HDL 65 11/07/2016   LDLCALC 76 11/07/2016   TRIG 80 11/07/2016   CHOLHDL 2.4 11/07/2016   She has been working on diet and exercise for Diabetes with diabetic chronic kidney disease, she is on bASA, she is on ACE/ARB, sugars have been 198 fasting, has been on antibiotic for sore on her leg, and had injection in her back last friday, needs new accuchek aviva, she is on humolog/lispro sliding scale  insulin if less than 130 she will take none, she is on metformin, and lantus 14 units at night and denies polydipsia, polyuria and visual disturbances. Last A1C was:  Lab Results  Component Value Date   HGBA1C 8.0 (H) 11/07/2016   Patient is on Vitamin D supplement.   Lab Results  Component Value Date   VD25OH 81 05/03/2016     BMI is Body mass index is 20.36 kg/m., she is working on diet and exercise. Wt Readings from Last 3 Encounters:  02/27/17 118 lb 9.6 oz (53.8 kg)  02/17/17 120 lb (54.4 kg)  02/10/17 121 lb (54.9 kg)     Past Medical History:  Diagnosis Date  . Arthritis   . Chronic back pain   . Diabetic neuropathy ([place])   . Hypercholesteremia   . Hypertension   . Liver lesion   . Neuropathy   . Type II or unspecified type diabetes mellitus without mention of complication, not stated as uncontrolled   . Vitamin D deficiency   . Wears glasses      Allergies  Allergen Reactions  . Morphine And Related Other (See Comments)    fuzzy feeling      Current Outpatient Medications on File Prior to Visit  Medication Sig Dispense Refill  . ACCU-CHEK AVIVA PLUS test strip Test 3 times a day 300 each 3  . aspirin 81 MG tablet Take 81 mg by mouth daily.    . ASSURE COMFORT LANCETS 30G MISC Check blood sugar 3 times daily-DX-E11.22 300 each 1  . bisoprolol-hydrochlorothiazide (ZIAC) 5-6.25 MG tablet TAKE 1 TABLET BY MOUTH DAILY FOR HIGH BLOOD PRESSURE 90 tablet 1  . Blood Glucose Calibration (ACCU-CHEK AVIVA) SOLN Check blood sugar 3 times daily-DX-E11.22 3 each 1  . Cholecalciferol (VITAMIN D3) 2000 UNITS TABS Take 1 capsule by mouth 2 (two) times daily.    . dorzolamide-timolol (COSOPT) 22.3-6.8 MG/ML ophthalmic solution     . EASY COMFORT INSULIN SYRINGE 30G X 1/2" 0.5 ML MISC     . fenofibrate micronized (LOFIBRA) 134 MG capsule TAKE ONE CAPSULE BY MOUTH  DAILY FOR BLOOD FATS 90 capsule 3  . LANTUS SOLOSTAR 100 UNIT/ML Solostar Pen INJECT 30 UNITS INTO THE SKIN AT  BEDTIME OR AS DIRECTED TO CONTROL BLOOD SUGARS 15 pen 3  . latanoprost (XALATAN) 0.005 % ophthalmic solution     . LOTEMAX 0.5 % GEL     . Magnesium 500 MG CAPS Take 500 mg by mouth 2 (two) times daily.    . Multiple Vitamins-Minerals (HAIR/SKIN/NAILS) TABS Take by mouth 2 (two) times daily.    . pravastatin (PRAVACHOL) 40 MG tablet TAKE ONE TABLET BY MOUTH AT BEDTIME FOR CHOLESTEROL 90 tablet 1  . rOPINIRole (REQUIP) 3 MG tablet TAKE 1 TABLET BY MOUTH TWICE DAILY AND TAKE TWO TABLETS AT  BEDTIME FOR RESTLESS LEGS 120 tablet 1  . traMADol (ULTRAM) 50 MG tablet Take 2 tablets (100 mg total) by mouth every 6 (six) hours as needed. 120 tablet 0  . triamcinolone ointment (KENALOG) 0.1 % Apply 1 application topically 2 (two) times daily. 80 g 1   No current facility-administered medications on file prior to visit.     ROS: all negative except above.   Physical Exam: Filed Weights   02/27/17 1054  Weight: 118 lb 9.6 oz (53.8 kg)   BP 126/76   Pulse 60   Temp (!) 97.3 F (36.3 C)   Resp 14   Ht 5\' 4"  (1.626 m)   Wt 118 lb 9.6 oz (53.8 kg)   SpO2 96%   BMI 20.36 kg/m  General Appearance: Well nourished, in no apparent distress. Eyes: PERRLA, EOMs, conjunctiva no swelling or erythema Sinuses: No Frontal/maxillary tenderness ENT/Mouth: Ext aud canals clear, TMs without erythema, bulging. No erythema, swelling, or exudate on post pharynx.  Tonsils not swollen or erythematous. Hearing normal.  Neck: Supple, thyroid normal.  Respiratory: Respiratory effort normal, BS equal bilaterally without rales, rhonchi, wheezing or stridor.  Cardio: RRR with no MRGs. Brisk peripheral pulses without edema.  Abdomen: Soft, + BS.  Non tender, no guarding, rebound, hernias, masses. Lymphatics: Non tender without lymphadenopathy.  Musculoskeletal: Full ROM, 5/5 strength, normal gait.  Skin: 2 Cm x 1.5 cm nodule left lateral lower leg with erythema, no warmth, nontender no fluctuance. , poor borders.    Warm, dry without rashes, lesions, ecchymosis.  Neuro: Cranial nerves intact. Normal muscle tone, no cerebellar symptoms. Sensation intact.  Psych: Awake and oriented X 3, normal affect, Insight and Judgment appropriate.     Vicie Mutters, PA-C 11:45 AM [facility]

## 2017-02-27 ENCOUNTER — Encounter: Payer: Self-pay | Admitting: Physician Assistant

## 2017-02-27 ENCOUNTER — Ambulatory Visit (INDEPENDENT_AMBULATORY_CARE_PROVIDER_SITE_OTHER): Payer: Medicare Other | Admitting: Physician Assistant

## 2017-02-27 VITALS — BP 126/76 | HR 60 | Temp 97.3°F | Resp 14 | Ht 64.0 in | Wt 118.6 lb

## 2017-02-27 DIAGNOSIS — E11319 Type 2 diabetes mellitus with unspecified diabetic retinopathy without macular edema: Secondary | ICD-10-CM

## 2017-02-27 DIAGNOSIS — Z794 Long term (current) use of insulin: Secondary | ICD-10-CM | POA: Diagnosis not present

## 2017-02-27 DIAGNOSIS — I10 Essential (primary) hypertension: Secondary | ICD-10-CM

## 2017-02-27 DIAGNOSIS — R229 Localized swelling, mass and lump, unspecified: Secondary | ICD-10-CM

## 2017-02-27 DIAGNOSIS — N183 Chronic kidney disease, stage 3 unspecified: Secondary | ICD-10-CM

## 2017-02-27 DIAGNOSIS — Z79899 Other long term (current) drug therapy: Secondary | ICD-10-CM

## 2017-02-27 DIAGNOSIS — E1122 Type 2 diabetes mellitus with diabetic chronic kidney disease: Secondary | ICD-10-CM | POA: Diagnosis not present

## 2017-02-27 DIAGNOSIS — I700 Atherosclerosis of aorta: Secondary | ICD-10-CM

## 2017-02-27 DIAGNOSIS — E782 Mixed hyperlipidemia: Secondary | ICD-10-CM

## 2017-02-27 DIAGNOSIS — E118 Type 2 diabetes mellitus with unspecified complications: Secondary | ICD-10-CM

## 2017-02-27 DIAGNOSIS — E1140 Type 2 diabetes mellitus with diabetic neuropathy, unspecified: Secondary | ICD-10-CM

## 2017-02-27 MED ORDER — ACCU-CHEK AVIVA DEVI: 0 refills | Status: DC

## 2017-02-27 MED ORDER — INSULIN LISPRO 100 UNIT/ML ~~LOC~~ SOLN: 5.0000 [IU] | Freq: Three times a day (TID) | SUBCUTANEOUS | 0 refills | Status: DC

## 2017-02-27 MED ORDER — METFORMIN HCL 1000 MG PO TABS: ORAL_TABLET | ORAL | 3 refills | Status: DC

## 2017-02-27 NOTE — Patient Instructions (Addendum)
We adjust your lantus or night time insulin based on your morning fasting sugar If your morning sugar is above 150 for more than 3 night in a row, increase the lantus by 2 units Start at the 16 units at night If your morning sugar is below 150 for more than 3 nights in a row, decrease the lantus/night time by 2 units  Here is sliding scale for your meal time insulin. This will help control the spikes in sugar depending on what you are eating. Please remember only take the insulin WITH food, if you are sick or unable to eat DO NOT take your insulin. Also a low blood sugar is much more dangerous than a high blood sugar. Your brain needs 2 things, oxygen and sugar, so lets make sure it gets both. If at any time you have a question or concern, call the office or message [place] in [place].   Sugar Insulin  <70  NONE  70-130 None  131-180 2 units  181-240 3 unit  241-300 4 units  301-350 5 units  351-400 6 units and call the doctor    Continue the metformin If your morning sugar is always below 100 then the issue is with your sugar spiking after meals. Try to take your blood sugar approximately 2 hours after eating, this number should be less than 200. If it is not, think about the foods that you ate and better choices you can make.      Bad carbs also include fruit juice, alcohol, and sweet tea. These are empty calories that do not signal to your brain that you are full.   Please remember the good carbs are still carbs which convert into sugar. So please measure them out no more than 1/2-1 cup of rice, oatmeal, pasta, and beans  Veggies are however free foods! Pile them on.   Not all fruit is created equal. Please see the list below, the fruit at the bottom is higher in sugars than the fruit at the top. Please avoid all dried fruits.

## 2017-02-28 LAB — BASIC METABOLIC PANEL WITH GFR
BUN/Creatinine Ratio: 20 (calc) (ref 6–22)
BUN: 29 mg/dL — ABNORMAL HIGH (ref 7–25)
CO2: 32 mmol/L (ref 20–32)
Calcium: 10.3 mg/dL (ref 8.6–10.4)
Chloride: 95 mmol/L — ABNORMAL LOW (ref 98–110)
Creat: 1.45 mg/dL — ABNORMAL HIGH (ref 0.60–0.88)
GFR, Est African American: 38 mL/min/{1.73_m2} — ABNORMAL LOW (ref >=60)
GFR, Est Non African American: 33 mL/min/{1.73_m2} — ABNORMAL LOW (ref >=60)
Glucose, Bld: 497 mg/dL — ABNORMAL HIGH (ref 65–99)
Potassium: 4.9 mmol/L (ref 3.5–5.3)
Sodium: 133 mmol/L — ABNORMAL LOW (ref 135–146)

## 2017-02-28 LAB — CBC WITH DIFFERENTIAL/PLATELET
Basophils Absolute: 63 cells/uL (ref 0–200)
Basophils Relative: 0.7 %
Eosinophils Absolute: 153 cells/uL (ref 15–500)
Eosinophils Relative: 1.7 %
HCT: 40.8 % (ref 35.0–45.0)
Hemoglobin: 13.2 g/dL (ref 11.7–15.5)
Lymphs Abs: 2601 cells/uL (ref 850–3900)
MCH: 30.4 pg (ref 27.0–33.0)
MCHC: 32.4 g/dL (ref 32.0–36.0)
MCV: 94 fL (ref 80.0–100.0)
MPV: 12.5 fL (ref 7.5–12.5)
Monocytes Relative: 9.6 %
Neutro Abs: 5319 cells/uL (ref 1500–7800)
Neutrophils Relative %: 59.1 %
Platelets: 333 10*3/uL (ref 140–400)
RBC: 4.34 10*6/uL (ref 3.80–5.10)
RDW: 11.1 % (ref 11.0–15.0)
Total Lymphocyte: 28.9 %
WBC mixed population: 864 cells/uL (ref 200–950)
WBC: 9 10*3/uL (ref 3.8–10.8)

## 2017-02-28 LAB — HEMOGLOBIN A1C
Hgb A1c MFr Bld: 10.8 % of total Hgb — ABNORMAL HIGH (ref <5.7)
Mean Plasma Glucose: 263 (calc)
eAG (mmol/L): 14.6 (calc)

## 2017-02-28 LAB — LIPID PANEL
Cholesterol: 170 mg/dL (ref <200)
HDL: 57 mg/dL (ref >50)
LDL Cholesterol (Calc): 86 mg/dL (calc)
Non-HDL Cholesterol (Calc): 113 mg/dL (calc) (ref <130)
Total CHOL/HDL Ratio: 3 (calc) (ref <5.0)
Triglycerides: 167 mg/dL — ABNORMAL HIGH (ref <150)

## 2017-02-28 LAB — HEPATIC FUNCTION PANEL
AG Ratio: 1.4 (calc) (ref 1.0–2.5)
ALT: 10 U/L (ref 6–29)
AST: 15 U/L (ref 10–35)
Albumin: 4.2 g/dL (ref 3.6–5.1)
Alkaline phosphatase (APISO): 70 U/L (ref 33–130)
Bilirubin, Direct: 0.1 mg/dL (ref 0.0–0.2)
Globulin: 2.9 g/dL (calc) (ref 1.9–3.7)
Indirect Bilirubin: 0.4 mg/dL (calc) (ref 0.2–1.2)
Total Bilirubin: 0.5 mg/dL (ref 0.2–1.2)
Total Protein: 7.1 g/dL (ref 6.1–8.1)

## 2017-02-28 LAB — MAGNESIUM: Magnesium: 2.1 mg/dL (ref 1.5–2.5)

## 2017-02-28 LAB — TSH: TSH: 1.34 mIU/L (ref 0.40–4.50)

## 2017-03-06 DIAGNOSIS — E119 Type 2 diabetes mellitus without complications: Secondary | ICD-10-CM | POA: Diagnosis not present

## 2017-03-06 DIAGNOSIS — H35351 Cystoid macular degeneration, right eye: Secondary | ICD-10-CM | POA: Diagnosis not present

## 2017-03-06 DIAGNOSIS — H353211 Exudative age-related macular degeneration, right eye, with active choroidal neovascularization: Secondary | ICD-10-CM | POA: Diagnosis not present

## 2017-03-13 DIAGNOSIS — C44719 Basal cell carcinoma of skin of left lower limb, including hip: Secondary | ICD-10-CM | POA: Diagnosis not present

## 2017-03-13 DIAGNOSIS — M79605 Pain in left leg: Secondary | ICD-10-CM | POA: Diagnosis not present

## 2017-03-13 DIAGNOSIS — L989 Disorder of the skin and subcutaneous tissue, unspecified: Secondary | ICD-10-CM | POA: Diagnosis not present

## 2017-03-15 DIAGNOSIS — D485 Neoplasm of uncertain behavior of skin: Secondary | ICD-10-CM | POA: Diagnosis not present

## 2017-03-15 DIAGNOSIS — L853 Xerosis cutis: Secondary | ICD-10-CM | POA: Diagnosis not present

## 2017-03-15 DIAGNOSIS — C4492 Squamous cell carcinoma of skin, unspecified: Secondary | ICD-10-CM | POA: Diagnosis not present

## 2017-03-15 DIAGNOSIS — L570 Actinic keratosis: Secondary | ICD-10-CM | POA: Diagnosis not present

## 2017-03-24 DIAGNOSIS — M47816 Spondylosis without myelopathy or radiculopathy, lumbar region: Secondary | ICD-10-CM | POA: Diagnosis not present

## 2017-03-24 DIAGNOSIS — R490 Dysphonia: Secondary | ICD-10-CM | POA: Diagnosis not present

## 2017-03-29 DIAGNOSIS — C4492 Squamous cell carcinoma of skin, unspecified: Secondary | ICD-10-CM | POA: Diagnosis not present

## 2017-03-29 DIAGNOSIS — L853 Xerosis cutis: Secondary | ICD-10-CM | POA: Diagnosis not present

## 2017-03-29 DIAGNOSIS — C44222 Squamous cell carcinoma of skin of right ear and external auricular canal: Secondary | ICD-10-CM | POA: Diagnosis not present

## 2017-03-29 DIAGNOSIS — C44229 Squamous cell carcinoma of skin of left ear and external auricular canal: Secondary | ICD-10-CM | POA: Diagnosis not present

## 2017-03-29 DIAGNOSIS — L570 Actinic keratosis: Secondary | ICD-10-CM | POA: Diagnosis not present

## 2017-04-03 ENCOUNTER — Other Ambulatory Visit: Payer: Self-pay | Admitting: Internal Medicine

## 2017-04-03 DIAGNOSIS — I10 Essential (primary) hypertension: Secondary | ICD-10-CM

## 2017-04-19 ENCOUNTER — Ambulatory Visit: Payer: Self-pay | Admitting: Physician Assistant

## 2017-04-19 ENCOUNTER — Encounter: Payer: Self-pay | Admitting: Physician Assistant

## 2017-04-19 ENCOUNTER — Ambulatory Visit (INDEPENDENT_AMBULATORY_CARE_PROVIDER_SITE_OTHER): Payer: Medicare Other | Admitting: Physician Assistant

## 2017-04-19 ENCOUNTER — Other Ambulatory Visit: Payer: Self-pay | Admitting: Internal Medicine

## 2017-04-19 VITALS — BP 122/68 | HR 66 | Resp 14 | Ht 64.0 in | Wt 120.8 lb

## 2017-04-19 DIAGNOSIS — C44709 Unspecified malignant neoplasm of skin of left lower limb, including hip: Secondary | ICD-10-CM

## 2017-04-19 DIAGNOSIS — N183 Chronic kidney disease, stage 3 unspecified: Secondary | ICD-10-CM

## 2017-04-19 DIAGNOSIS — I700 Atherosclerosis of aorta: Secondary | ICD-10-CM

## 2017-04-19 DIAGNOSIS — E1140 Type 2 diabetes mellitus with diabetic neuropathy, unspecified: Secondary | ICD-10-CM | POA: Diagnosis not present

## 2017-04-19 DIAGNOSIS — E1122 Type 2 diabetes mellitus with diabetic chronic kidney disease: Secondary | ICD-10-CM | POA: Diagnosis not present

## 2017-04-19 DIAGNOSIS — E11319 Type 2 diabetes mellitus with unspecified diabetic retinopathy without macular edema: Secondary | ICD-10-CM | POA: Diagnosis not present

## 2017-04-19 MED ORDER — DOXYCYCLINE HYCLATE 100 MG PO CAPS: ORAL_CAPSULE | ORAL | 0 refills | Status: DC

## 2017-04-19 NOTE — Progress Notes (Deleted)
Diabetes Education and Follow-Up Visit  83 y.o.female presents for diabetic education. She has Diabetes Mellitus type 2:  {with or without complications:30421263}, she {ACTION; IS/IS NOT:21021397} on bASA, and denies {Symptoms; diabetes w/o none:19199}.  Last hemoglobin A1c was: Lab Results  Component Value Date   HGBA1C 10.8 (H) 02/27/2017   HGBA1C 8.0 (H) 11/07/2016   HGBA1C 7.4 (H) 08/01/2016    There is no height or weight on file to calculate BMI.  Pt is on a regimen of: {Treatments; meds diabetes oral:16626}  Pt checks her sugars {1-4:31454} x day  Lowest sugar was ***.  She has hypoglycemia awareness.  Highest sugar was ***.  Glucometer:   Exercise:  Patient {DOES NOT does:27190::"does not"} have CKD She {ACTION; IS/IS DZH:29924268} on ACE/ARB  Lab Results  Component Value Date   GFRAA 38 (L) 02/27/2017      Lab Results  Component Value Date   GFRNONAA 33 (L) 02/27/2017    Lab Results  Component Value Date   CREATININE 1.45 (H) 02/27/2017   BUN 29 (H) 02/27/2017   NA 133 (L) 02/27/2017   K 4.9 02/27/2017   CL 95 (L) 02/27/2017   CO2 32 02/27/2017    Lab Results  Component Value Date   MICROALBUR 0.2 05/03/2016     She {ACTION; IS/IS TMH:96222979} on a Statin.  She {ACTION; IS/IS GXQ:11941740} at goal of less than 70.  Lab Results  Component Value Date   CHOL 170 02/27/2017   HDL 57 02/27/2017   LDLCALC 76 11/07/2016   TRIG 167 (H) 02/27/2017   CHOLHDL 3.0 02/27/2017     Problem List has Hypertension; Chronic pain; CKD stage 3 due to type 2 diabetes mellitus ([place]); Mixed hyperlipidemia; Vitamin D deficiency; Neuropathy due to type 2 diabetes mellitus ([place]); Medication management; Diabetic retinopathy ([place]); Encounter for Medicare annual wellness exam; Atherosclerosis of aorta ([place]); and Type 2 diabetes mellitus with complication, with long-term current use of insulin ([place]) on their problem list.  Medications Current Outpatient Medications on  File Prior to Visit  Medication Sig  . ACCU-CHEK AVIVA PLUS test strip Test 3 times a day  . aspirin 81 MG tablet Take 81 mg by mouth daily.  . ASSURE COMFORT LANCETS 30G MISC Check blood sugar 3 times daily-DX-E11.22  . bisoprolol-hydrochlorothiazide (ZIAC) 5-6.25 MG tablet TAKE ONE TABLET BY MOUTH DAILY FOR FOR BLOOD PRESSURE  . Blood Glucose Calibration (ACCU-CHEK AVIVA) SOLN Check blood sugar 3 times daily-DX-E11.22  . Blood Glucose Monitoring Suppl (ACCU-CHEK AVIVA) device Use as instructed  . Cholecalciferol (VITAMIN D3) 2000 UNITS TABS Take 1 capsule by mouth 2 (two) times daily.  . dorzolamide-timolol (COSOPT) 22.3-6.8 MG/ML ophthalmic solution   . EASY COMFORT INSULIN SYRINGE 30G X 1/2" 0.5 ML MISC   . fenofibrate micronized (LOFIBRA) 134 MG capsule TAKE ONE CAPSULE BY MOUTH  DAILY FOR BLOOD FATS  . insulin lispro (HUMALOG) 100 UNIT/ML injection Inject 0.05-0.1 mLs (5-10 Units total) 3 (three) times daily with meals into the skin. Patient takes Rx on a sliding scale basis  . LANTUS SOLOSTAR 100 UNIT/ML Solostar Pen INJECT 30 UNITS INTO THE SKIN AT BEDTIME OR AS DIRECTED TO CONTROL BLOOD SUGARS  . latanoprost (XALATAN) 0.005 % ophthalmic solution   . LOTEMAX 0.5 % GEL   . Magnesium 500 MG CAPS Take 500 mg by mouth 2 (two) times daily.  . metFORMIN (GLUCOPHAGE) 1000 MG tablet TAKE ONE TABLET TWO TIMES A DAY AFTER MEALS FOR DIABETES  . Multiple Vitamins-Minerals (HAIR/SKIN/NAILS) TABS Take  by mouth 2 (two) times daily.  . pravastatin (PRAVACHOL) 40 MG tablet TAKE ONE TABLET BY MOUTH AT BEDTIME FOR CHOLESTEROL  . rOPINIRole (REQUIP) 3 MG tablet TAKE 1 TABLET BY MOUTH TWICE DAILY AND TAKE TWO TABLETS AT BEDTIME FOR RESTLESS LEGS  . traMADol (ULTRAM) 50 MG tablet Take 2 tablets (100 mg total) by mouth every 6 (six) hours as needed.  . triamcinolone ointment (KENALOG) 0.1 % Apply 1 application topically 2 (two) times daily.   No current facility-administered medications on file prior to  visit.     ROS- see HPI  Physical Exam: There were no vitals taken for this visit. There is no height or weight on file to calculate BMI. General Appearance: Well nourished, in no apparent distress. Eyes: PERRLA, EOMs, conjunctiva no swelling or erythema ENT/Mouth: Ext aud canals clear, TMs without erythema, bulging. No erythema, swelling, or exudate on post pharynx.  Tonsils not swollen or erythematous. Hearing normal.  Respiratory: Respiratory effort normal, BS equal bilaterally without rales, rhonchi, wheezing or stridor.  Cardio: RRR with no MRGs. Brisk peripheral pulses without edema.  Abdomen: Soft, + BS.  Non tender, no guarding, rebound, hernias, masses. Musculoskeletal: Full ROM, 5/5 strength, normal gait.  Skin: Warm, dry without rashes, lesions, ecchymosis.  Neuro: Cranial nerves intact. Normal muscle tone, no cerebellar symptoms. Sensation intact.    Plan and Assessment: Diabetes Education: Reviewed 'ABCs' of diabetes management (respective goals in parentheses):  A1C (<7), blood pressure (<130/80), and cholesterol (LDL <70) Eye Exam yearly and Dental Exam every 6 months. Dietary recommendations Physical Activity recommendations - Strongly advised her to start checking sugars at different times of the day - check 2 times a day, rotating checks - given sugar log and advised how to fill it and to bring it at next appt  - given foot care handout and explained the principles  - given instructions for hypoglycemia management    Future Appointments  Date Time Provider [place]  04/19/2017  4:30 PM Vicie Mutters, PA-C [facility] None  06/05/2017 10:00 AM [name], MD [facility] None

## 2017-04-19 NOTE — Patient Instructions (Addendum)
Do the lantus only 14 units at night- this effects the morning sugar  Can do the humolog WITH LARGE MEALS AT [facility] This insulin immediately brings down your sugar so only take with food Follow the sliding scale  Sugar Insulin  <70  NONE  70-130 None  131-180 2 units  181-240 3 unit  241-300 4 units  301-350 5 units  351-400 6 units and call the doctor     If you have any low blood sugars please cut down on your insulin or call us immediately  If your morning sugar is always below 100 then the issue is with your sugar spiking after meals. Try to take your blood sugar approximately 2 hours after eating, this number should be less than 200. If it is not, think about the foods that you ate and better choices you can make.   Please be careful with your insulin This medication forces your blood sugar down no matter what it is starting at. If at any time you start to have low blood sugars in the morning or during the day please decrease your insulin. Please never take this medication if you are sick or can not eat. A low blood sugar is much more dangerous than a high blood sugar. Your brain needs two things, sugar and oxygen.    Stop the neosporin/polysporin, just use vaseline Wash with warm water and soap, pat dry If redness goes outside of the marks then get the antibiotic given filled and start to take it   Wound Infection A wound infection happens when germs start to grow in the wound. Germs that cause wound infections are most commonly bacteria. Other types of infections can occur as well. In some cases, infection can cause the wound to break open. Wound infections need treatment. If a wound infection is left untreated, complications can occur. This may include an infection in your bloodstream (sepsis) or in a bone (osteomyelitis). What are the causes? This condition is caused by germs growing in the wound. What increases the risk? The following  factors may make you more likely to develop this condition:  Having a weak body defense system (immune system).  Having diabetes.  Taking steroid medicines for a long time (chronic use).  Smoking.  Older age.  Being overweight.  What are the signs or symptoms? Symptoms of this condition include:  Having more redness, swelling, or pain at the wound site.  Having more blood, pus, or fluid at the wound site.  A bad smell coming from a wound or bandage (dressing).  Having a fever.  Feeling tired or fatigued.  How is this diagnosed? This condition is diagnosed with a medical history and physical exam. You may also have blood tests. How is this treated? This condition is treated with an antibiotic medicine. The infection should improve 24-48 hours after you start antibiotics. Any redness around the wound should stop spreading, and the wound should be less painful. Follow these instructions at home: Medicines  Take or apply over-the-counter and prescription medicines only as told by your health care provider.  If you were prescribed antibiotic medicine, take or apply it as told by your health care provider. Do not stop using the antibiotic even if your condition improves. Wound care  Clean the wound each day or as told by your health care provider. ? Wash the wound with mild soap and water. ? Rinse the wound with water to remove all  soap. ? Pat the wound dry with a clean towel. Do not rub it.  Follow instructions from your health care provider about how to take care of your wound. Make sure you: ? Wash your hands with soap and water before you change your dressing. If soap and water are not available, use hand sanitizer. ? Change your dressing as told by your health care provider. ? Leave stitches (sutures), skin glue, or adhesive strips in place, if this applies. These skin closures may need to stay in place for 2 weeks or longer. If adhesive strip edges start to loosen and  curl up, you may trim the loose edges. Do not remove adhesive strips completely unless your health care provider tells you to do that. Some wounds are left open to heal on their own.  Check your wound every day for signs of infection. Watch for: ? More redness, swelling, or pain. ? More fluid or blood. ? Warmth. ? Pus or a bad smell. General instructions  Keep the dressing dry until your health care provider says it can be removed.  Do not take baths, swim, use a hot tub, or do anything that would put your wound underwater until your health care provider approves.  Raise (elevate) the injured area above the level of your heart while you are sitting or lying down.  Do not scratch or pick at the wound.  Keep all follow-up visits as told by your health care provider. This is important. Contact a health care provider if:  Your pain is not controlled with medicine.  You have more redness, swelling, or pain around your wound.  You have more fluid or blood coming from your wound.  Your wound feels warm to the touch.  You have pus coming from your wound.  You continue to notice a bad smell coming from your wound or your dressing.  Your wound that was closed breaks open. Get help right away if:  You have a red streak going away from your wound.  You have a fever. This information is not intended to replace advice given to you by your health care provider. Make sure you discuss any questions you have with your health care provider. Document Released: 01/08/2003 Document Revised: 09/23/2015 Document Reviewed: 09/29/2014 Elsevier Interactive Patient Education  2018 [employer].

## 2017-04-19 NOTE — Progress Notes (Signed)
Diabetes Education and Follow-Up Visit  83 y.o.female presents for diabetic education. She has Diabetes Mellitus type 2:  with diabetic chronic kidney disease, with diabetic polyneuropathy and with diabetic retinopathy; mild non-proliferative; without macular edema, she is on bASA, and denies paresthesia of the feet, polydipsia, polyuria and visual disturbances.  Last hemoglobin A1c was: Lab Results  Component Value Date   HGBA1C 10.8 (H) 02/27/2017   HGBA1C 8.0 (H) 11/07/2016   HGBA1C 7.4 (H) 08/01/2016    Body mass index is 20.74 kg/m.  Pt is on a regimen of: humolog/ lispro sliding scale, if less than 130 she will take none- ADJUSTED LAST VISIT Lantus 14 units- INCREASED TO 16 LAST VISIT but she has gone back down to 14 units Metformin 500mg  BID or 1000 mg a day  Pt checks her sugars 1 x day and only takes insulin in the morning  Lowest sugar was 73.  She did not have any hypoglycemia awareness at that time.  Highest sugar was dec 4th 230.  Glucometer: accuchek aviva  Exercise: none  Patient does have CKD She is not on ACE/ARB due to kidney function.   Lab Results  Component Value Date   GFRNONAA 33 (L) 02/27/2017    Lab Results  Component Value Date   CREATININE 1.45 (H) 02/27/2017   BUN 29 (H) 02/27/2017   NA 133 (L) 02/27/2017   K 4.9 02/27/2017   CL 95 (L) 02/27/2017   CO2 32 02/27/2017   Lab Results  Component Value Date   MICROALBUR 0.2 05/03/2016    She is on a Statin. Pravastatin 40 and fenofibrate.  She is not at goal of less than 70.  Lab Results  Component Value Date   CHOL 170 02/27/2017   HDL 57 02/27/2017   LDLCALC 76 11/07/2016   TRIG 167 (H) 02/27/2017   CHOLHDL 3.0 02/27/2017    Problem List has Hypertension; Chronic pain; CKD stage 3 due to type 2 diabetes mellitus ([place]); Mixed hyperlipidemia; Vitamin D deficiency; Neuropathy due to type 2 diabetes mellitus ([place]); Medication management; Diabetic retinopathy ([place]); Encounter for Medicare  annual wellness exam; Atherosclerosis of aorta ([place]); and Type 2 diabetes mellitus with complication, with long-term current use of insulin ([place]) on their problem list.  Medications Current Outpatient Medications on File Prior to Visit  Medication Sig  . ACCU-CHEK AVIVA PLUS test strip Test 3 times a day  . aspirin 81 MG tablet Take 81 mg by mouth daily.  . ASSURE COMFORT LANCETS 30G MISC Check blood sugar 3 times daily-DX-E11.22  . bisoprolol-hydrochlorothiazide (ZIAC) 5-6.25 MG tablet TAKE ONE TABLET BY MOUTH DAILY FOR FOR BLOOD PRESSURE  . Blood Glucose Calibration (ACCU-CHEK AVIVA) SOLN Check blood sugar 3 times daily-DX-E11.22  . Blood Glucose Monitoring Suppl (ACCU-CHEK AVIVA) device Use as instructed  . Cholecalciferol (VITAMIN D3) 2000 UNITS TABS Take 1 capsule by mouth 2 (two) times daily.  . dorzolamide-timolol (COSOPT) 22.3-6.8 MG/ML ophthalmic solution   . EASY COMFORT INSULIN SYRINGE 30G X 1/2" 0.5 ML MISC   . fenofibrate micronized (LOFIBRA) 134 MG capsule TAKE ONE CAPSULE BY MOUTH  DAILY FOR BLOOD FATS  . insulin lispro (HUMALOG) 100 UNIT/ML injection Inject 0.05-0.1 mLs (5-10 Units total) 3 (three) times daily with meals into the skin. Patient takes Rx on a sliding scale basis  . LANTUS SOLOSTAR 100 UNIT/ML Solostar Pen INJECT 30 UNITS INTO THE SKIN AT BEDTIME OR AS DIRECTED TO CONTROL BLOOD SUGARS  . latanoprost (XALATAN) 0.005 % ophthalmic solution   .  LOTEMAX 0.5 % GEL   . Magnesium 500 MG CAPS Take 500 mg by mouth 2 (two) times daily.  . Multiple Vitamins-Minerals (HAIR/SKIN/NAILS) TABS Take by mouth 2 (two) times daily.  . pravastatin (PRAVACHOL) 40 MG tablet TAKE ONE TABLET BY MOUTH AT BEDTIME FOR CHOLESTEROL  . rOPINIRole (REQUIP) 3 MG tablet TAKE 1 TABLET BY MOUTH TWICE DAILY AND TAKE TWO TABLETS AT BEDTIME FOR RESTLESS LEGS  . traMADol (ULTRAM) 50 MG tablet Take 2 tablets (100 mg total) by mouth every 6 (six) hours as needed.  . triamcinolone ointment (KENALOG) 0.1 %  Apply 1 application topically 2 (two) times daily.   No current facility-administered medications on file prior to visit.     ROS- see HPI  Physical Exam: Blood pressure 122/68, pulse 66, resp. rate 14, height 5\' 4"  (1.626 m), weight 120 lb 12.8 oz (54.8 kg), SpO2 97 %. Body mass index is 20.74 kg/m. General Appearance: Well nourished, in no apparent distress. Eyes: PERRLA, EOMs, conjunctiva no swelling or erythema ENT/Mouth: Ext aud canals clear, TMs without erythema, bulging. No erythema, swelling, or exudate on post pharynx.  Tonsils not swollen or erythematous. Hearing normal.  Respiratory: Respiratory effort normal, BS equal bilaterally without rales, rhonchi, wheezing or stridor.  Cardio: RRR with no MRGs. Brisk peripheral pulses without edema.  Abdomen: Soft, + BS.  Non tender, no guarding, rebound, hernias, masses. Musculoskeletal: Full ROM, 5/5 strength, normal gait.  Skin: Warm, dry without rashes, lesions, ecchymosis.  Neuro: Cranial nerves intact. Normal muscle tone, no cerebellar symptoms. Sensation intact.    Plan and Assessment: Diabetes Education: Reviewed 'ABCs' of diabetes management (respective goals in parentheses):  A1C (<7), blood pressure (<130/80), and cholesterol (LDL <70) Eye Exam yearly and Dental Exam every 6 months. Dietary recommendations Physical Activity recommendations - Strongly advised her to start checking sugars at different times of the day - check 2 times a day, rotating checks - given sugar log and advised how to fill it and to bring it at next appt  - given foot care handout and explained the principles  - given instructions for hypoglycemia management    Future Appointments  Date Time Provider [place]  06/05/2017 10:00 AM [name], MD [facility] None

## 2017-04-24 DIAGNOSIS — H353113 Nonexudative age-related macular degeneration, right eye, advanced atrophic without subfoveal involvement: Secondary | ICD-10-CM | POA: Diagnosis not present

## 2017-04-24 DIAGNOSIS — E119 Type 2 diabetes mellitus without complications: Secondary | ICD-10-CM | POA: Diagnosis not present

## 2017-04-24 DIAGNOSIS — H35351 Cystoid macular degeneration, right eye: Secondary | ICD-10-CM | POA: Diagnosis not present

## 2017-04-24 DIAGNOSIS — H353222 Exudative age-related macular degeneration, left eye, with inactive choroidal neovascularization: Secondary | ICD-10-CM | POA: Diagnosis not present

## 2017-04-24 DIAGNOSIS — H353211 Exudative age-related macular degeneration, right eye, with active choroidal neovascularization: Secondary | ICD-10-CM | POA: Diagnosis not present

## 2017-04-28 DIAGNOSIS — L570 Actinic keratosis: Secondary | ICD-10-CM | POA: Diagnosis not present

## 2017-04-28 DIAGNOSIS — L853 Xerosis cutis: Secondary | ICD-10-CM | POA: Diagnosis not present

## 2017-04-28 DIAGNOSIS — Z8589 Personal history of malignant neoplasm of other organs and systems: Secondary | ICD-10-CM | POA: Diagnosis not present

## 2017-05-08 ENCOUNTER — Ambulatory Visit: Payer: Medicare Other | Admitting: Registered"

## 2017-05-11 ENCOUNTER — Ambulatory Visit: Payer: Medicare Other | Admitting: Registered"

## 2017-05-16 ENCOUNTER — Encounter: Payer: Self-pay | Admitting: Internal Medicine

## 2017-06-05 ENCOUNTER — Ambulatory Visit (INDEPENDENT_AMBULATORY_CARE_PROVIDER_SITE_OTHER): Payer: Medicare Other | Admitting: Internal Medicine

## 2017-06-05 ENCOUNTER — Encounter: Payer: Self-pay | Admitting: Internal Medicine

## 2017-06-05 VITALS — BP 130/62 | HR 64 | Temp 97.1°F | Resp 16 | Ht 63.5 in | Wt 119.6 lb

## 2017-06-05 DIAGNOSIS — Z0000 Encounter for general adult medical examination without abnormal findings: Secondary | ICD-10-CM | POA: Diagnosis not present

## 2017-06-05 DIAGNOSIS — E1140 Type 2 diabetes mellitus with diabetic neuropathy, unspecified: Secondary | ICD-10-CM | POA: Diagnosis not present

## 2017-06-05 DIAGNOSIS — Z136 Encounter for screening for cardiovascular disorders: Secondary | ICD-10-CM

## 2017-06-05 DIAGNOSIS — N183 Chronic kidney disease, stage 3 unspecified: Secondary | ICD-10-CM

## 2017-06-05 DIAGNOSIS — Z1211 Encounter for screening for malignant neoplasm of colon: Secondary | ICD-10-CM

## 2017-06-05 DIAGNOSIS — E11319 Type 2 diabetes mellitus with unspecified diabetic retinopathy without macular edema: Secondary | ICD-10-CM | POA: Diagnosis not present

## 2017-06-05 DIAGNOSIS — E782 Mixed hyperlipidemia: Secondary | ICD-10-CM

## 2017-06-05 DIAGNOSIS — Z794 Long term (current) use of insulin: Secondary | ICD-10-CM

## 2017-06-05 DIAGNOSIS — E039 Hypothyroidism, unspecified: Secondary | ICD-10-CM

## 2017-06-05 DIAGNOSIS — Z0001 Encounter for general adult medical examination with abnormal findings: Secondary | ICD-10-CM

## 2017-06-05 DIAGNOSIS — I10 Essential (primary) hypertension: Secondary | ICD-10-CM | POA: Diagnosis not present

## 2017-06-05 DIAGNOSIS — E1122 Type 2 diabetes mellitus with diabetic chronic kidney disease: Secondary | ICD-10-CM | POA: Diagnosis not present

## 2017-06-05 DIAGNOSIS — N390 Urinary tract infection, site not specified: Secondary | ICD-10-CM

## 2017-06-05 DIAGNOSIS — Z79899 Other long term (current) drug therapy: Secondary | ICD-10-CM

## 2017-06-05 DIAGNOSIS — E559 Vitamin D deficiency, unspecified: Secondary | ICD-10-CM

## 2017-06-05 DIAGNOSIS — Z1212 Encounter for screening for malignant neoplasm of rectum: Secondary | ICD-10-CM

## 2017-06-05 MED ORDER — GLIPIZIDE 5 MG PO TABS: ORAL_TABLET | ORAL | 2 refills | Status: DC

## 2017-06-05 NOTE — Progress Notes (Signed)
[facility] [name], M.D.     [name]. Silverio Lay, P.A.-C Liane Comber, Olga [address] [place], [place] [address] Telephone [phone] Telefax [phone] Annual Screening/Preventative Visit & Comprehensive Evaluation &  Examination     This very nice 84 y.o. MWF presents for a Screening/Preventative Visit & comprehensive evaluation and management of multiple medical co-morbidities.  Patient has been followed for HTN, T2_IDDM/CKD3, Hyperlipidemia and Vitamin D Deficiency.      HTN predates circa 2008. [name] BP has been controlled at home and patient denies any cardiac symptoms as chest pain, palpitations, shortness of breath, dizziness or ankle swelling. Today's BP is at goal -  130/62.      [name] hyperlipidemia is controlled with diet and medications. Patient denies myalgias or other medication SE's. Last lipids were at goal albeit elevated Trig's: Lab Results  Component Value Date   CHOL 167 06/05/2017   HDL 48 (L) 06/05/2017   LDLCALC 76 11/07/2016   TRIG 198 (H) 06/05/2017   CHOLHDL 3.5 06/05/2017      Patient has Insulin Requiring T2_DM predating since 1996 and CKD3 (GFR 33) and she was started on Insulin in 2000.  [name] poor compliance and comprehension of her diabetes & management is very limited as reflected in her A1c's. Patient denies reactive hypoglycemic symptoms, visual blurring, diabetic polys, but does have burning paresthesias of her feet. She also has Diabetic Neuropathy followed by Dr. Katy Fitch and she receives periodic Avastin intraoccular injections per Dr Zadie Rhine. She is followed by Dr Lorrene Reid for her CKD3/4. Last A1c was not at goal: Lab Results  Component Value Date   HGBA1C 10.8 (H) 02/27/2017      Finally, patient has history of Vitamin D Deficiency and last Vitamin D was at goal: Lab Results  Component Value Date   VD25OH 81 05/03/2016   Current Outpatient  Medications on File Prior to Visit  Medication Sig  . ACCU-CHEK AVIVA PLUS test strip Test 3 times a day  . aspirin 81 MG tablet Take 81 mg by mouth daily.  . ASSURE COMFORT LANCETS 30G MISC Check blood sugar 3 times daily-DX-E11.22  . bisoprolol-hydrochlorothiazide (ZIAC) 5-6.25 MG tablet TAKE ONE TABLET BY MOUTH DAILY FOR FOR BLOOD PRESSURE  . Blood Glucose Calibration (ACCU-CHEK AVIVA) SOLN Check blood sugar 3 times daily-DX-E11.22  . Blood Glucose Monitoring Suppl (ACCU-CHEK AVIVA) device Use as instructed  . Cholecalciferol (VITAMIN D3) 2000 UNITS TABS Take 1 capsule by mouth 2 (two) times daily.  . dorzolamide-timolol (COSOPT) 22.3-6.8 MG/ML ophthalmic solution   . doxycycline (VIBRAMYCIN) 100 MG capsule Take 1 capsule twice daily with food  . EASY COMFORT INSULIN SYRINGE 30G X 1/2" 0.5 ML MISC   . fenofibrate micronized (LOFIBRA) 134 MG capsule TAKE ONE CAPSULE BY MOUTH  DAILY FOR BLOOD FATS  . insulin lispro (HUMALOG) 100 UNIT/ML injection Inject 0.05-0.1 mLs (5-10 Units total) 3 (three) times daily with meals into the skin. Patient takes Rx on a sliding scale basis  . LANTUS SOLOSTAR 100 UNIT/ML Solostar Pen INJECT 30 UNITS INTO THE SKIN AT BEDTIME OR AS DIRECTED TO CONTROL BLOOD SUGARS  . latanoprost (XALATAN) 0.005 % ophthalmic solution   . LOTEMAX 0.5 % GEL   . Magnesium 500 MG CAPS Take 500 mg by mouth 2 (two) times daily.  . metFORMIN (GLUCOPHAGE) 1000 MG tablet TAKE ONE TABLET BY MOUTH TWICE A DAY AFTER MEALS FOR DIABETES  . Multiple Vitamins-Minerals (HAIR/SKIN/NAILS) TABS Take by mouth  2 (two) times daily.  . pravastatin (PRAVACHOL) 40 MG tablet TAKE ONE TABLET BY MOUTH AT BEDTIME FOR CHOLESTEROL  . rOPINIRole (REQUIP) 3 MG tablet TAKE 1 TABLET BY MOUTH TWICE DAILY AND TAKE TWO TABLETS AT BEDTIME FOR RESTLESS LEGS  . traMADol (ULTRAM) 50 MG tablet Take 2 tablets (100 mg total) by mouth every 6 (six) hours as needed.  . triamcinolone ointment (KENALOG) 0.1 % Apply 1 application  topically 2 (two) times daily.   No current facility-administered medications on file prior to visit.    Allergies  Allergen Reactions  . Morphine And Related Other (See Comments)    fuzzy feeling   Past Medical History:  Diagnosis Date  . Arthritis   . Chronic back pain   . Diabetic neuropathy ([place])   . Hypercholesteremia   . Hypertension   . Liver lesion   . Neuropathy   . Type II or unspecified type diabetes mellitus without mention of complication, not stated as uncontrolled   . Vitamin D deficiency   . Wears glasses    Health Maintenance  Topic Date Due  . URINE MICROALBUMIN  05/03/2017  . HEMOGLOBIN A1C  08/27/2017  . OPHTHALMOLOGY EXAM  10/04/2017  . FOOT EXAM  11/07/2017  . TETANUS/TDAP  01/03/2021  . INFLUENZA VACCINE  Completed  . DEXA SCAN  Completed  . PNA vac Low Risk Adult  Completed   Immunization History  Administered Date(s) Administered  . DT 01/04/2011  . Influenza Split 01/23/2013  . Influenza, High Dose Seasonal PF 02/27/2014, 03/11/2015, 12/21/2015, 01/30/2017  . Pneumococcal Conjugate-13 02/27/2014  . Pneumococcal Polysaccharide-23 01/06/2012   Last Colon - 02/17/2005 - Dr Teena Irani - recc no further f/U Last MGM - 03/23/2016  -  Past Surgical History:  Procedure Laterality Date  . ABDOMINAL HYSTERECTOMY    . APPENDECTOMY    . BACK SURGERY  1993   lumb lam-fusion  . CESAREAN SECTION     x 3   . COLONOSCOPY    . EYE SURGERY     both cataracts  . TRIGGER FINGER RELEASE  01/10/2012   Procedure: RELEASE TRIGGER FINGER/A-1 PULLEY;  Surgeon: Hessie Dibble, MD;  Location: [facility];  Service: Orthopedics;  Laterality: Right;  right ring trigger release   Family History  Problem Relation Age of Onset  . Liver disease Sister   . Diabetes Sister   . Colon cancer Neg Hx    Social History   Tobacco Use  . Smoking status: Never Smoker  . Smokeless tobacco: Never Used  Substance Use Topics  . Alcohol use: No  . Drug  use: No    ROS Constitutional: Denies fever, chills, weight loss/gain, headaches, insomnia,  night sweats, and change in appetite. Does c/o fatigue. Eyes: Denies redness, blurred vision, diplopia, discharge, itchy, watery eyes.  ENT: Denies discharge, congestion, post nasal drip, epistaxis, sore throat, earache, hearing loss, dental pain, Tinnitus, Vertigo, Sinus pain, snoring.  Cardio: Denies chest pain, palpitations, irregular heartbeat, syncope, dyspnea, diaphoresis, orthopnea, PND, claudication, edema Respiratory: denies cough, dyspnea, DOE, pleurisy, hoarseness, laryngitis, wheezing.  Gastrointestinal: Denies dysphagia, heartburn, reflux, water brash, pain, cramps, nausea, vomiting, bloating, diarrhea, constipation, hematemesis, melena, hematochezia, jaundice, hemorrhoids Genitourinary: Denies dysuria, frequency, urgency, nocturia, hesitancy, discharge, hematuria, flank pain Breast: Breast lumps, nipple discharge, bleeding.  Musculoskeletal: Denies arthralgia, myalgia, stiffness, Jt. Swelling, pain, limp, and strain/sprain. Denies falls. Skin: Denies puritis, rash, hives, warts, acne, eczema, changing in skin lesion Neuro: No weakness, tremor, incoordination, spasms, paresthesia,  pain Psychiatric: Denies confusion, memory loss, sensory loss. Denies Depression. Endocrine: Denies change in weight, skin, hair change, nocturia, and paresthesia, diabetic polys, visual blurring, hyper / hypo glycemic episodes.  Heme/Lymph: No excessive bleeding, bruising, enlarged lymph nodes.  Physical Exam  BP 130/62   Pulse 64   Temp (!) 97.1 F (36.2 C)   Resp 16   Ht 5' 3.5" (1.613 m)   Wt 119 lb 9.6 oz (54.3 kg)   BMI 20.85 kg/m   General Appearance: Well nourished, well groomed and in no apparent distress.  Eyes: PERRLA, EOMs, conjunctiva no swelling or erythema, normal fundi and vessels. Sinuses: No frontal/maxillary tenderness ENT/Mouth: EACs patent / TMs  nl. Nares clear without erythema,  swelling, mucoid exudates. Oral hygiene is good. No erythema, swelling, or exudate. Tongue normal, non-obstructing. Tonsils not swollen or erythematous. Hearing normal.  Neck: Supple, thyroid not palpable. No bruits, nodes or JVD. Respiratory: Respiratory effort normal.  BS equal and clear bilateral without rales, rhonci, wheezing or stridor. Cardio: Heart sounds are normal with regular rate and rhythm and no murmurs, rubs or gallops. Peripheral pulses are normal and equal bilaterally without edema. No aortic or femoral bruits. Chest: symmetric with normal excursions and percussion. Breasts: Symmetric, without lumps, nipple discharge, retractions, or fibrocystic changes.  Abdomen: Flat, soft with bowel sounds active. Nontender, no guarding, rebound, hernias, masses, or organomegaly.  Lymphatics: Non tender without lymphadenopathy.  Musculoskeletal: Full ROM all peripheral extremities, joint stability, 5/5 strength, and normal gait. Skin: Warm and dry without rashes, lesions, cyanosis, clubbing or  ecchymosis.  Neuro: Cranial nerves intact, reflexes equal bilaterally. Normal muscle tone, no cerebellar symptoms. Sensation intact to touch and vibratory to the toes bilaterallyand decreased  Monofilament from the ankles to the toes. Pysch: Alert and oriented X 3, normal affect, Insight and Judgment appropriate.   Assessment and Plan  1. Annual Preventative Screening Examination  2. Essential hypertension  - EKG 12-Lead - Urinalysis, Routine w reflex microscopic - Microalbumin / creatinine urine ratio - CBC with Differential/Platelet - BASIC METABOLIC PANEL WITH GFR - Magnesium - TSH  3. Hyperlipidemia, mixed  - EKG 12-Lead - Hepatic function panel - Lipid panel - TSH  4. Type 2 diabetes mellitus with stage 3 chronic kidney disease, with long-term current use of insulin (HCC)  - New Rx Glipizide 5 mg to begin 1/2 to 1 tab 3 x/day and to begin taper of Lantus by 2 units to maintain  flucose levels less than 150 mg %  - EKG 12-Lead - Urinalysis, Routine w reflex microscopic - Microalbumin / creatinine urine ratio - Hemoglobin A1c  5. Vitamin D deficiency  - Vit D level  6. Neuropathy due to type 2 diabetes mellitus (HCC)  - HM DIABETES FOOT EXAM - LOW EXTREMITY NEUR EXAM DOCUM - Hemoglobin A1c  7. Diabetic retinopathy associated with type 2 diabetes mellitus (HCC)  - Hemoglobin A1c  8. Hypothyroidism  - TSH  9. Colorectal cancer screening  - POC Hemoccult Bld/Stl  10. Medication management  - Urinalysis, Routine w reflex microscopic - Microalbumin / creatinine urine ratio - CBC with Differential/Platelet - BASIC METABOLIC PANEL WITH GFR - Hepatic function panel - Magnesium - Lipid panel - TSH - Hemoglobin A1c  11. Screening for ischemic heart disease  - EKG 12-Lead       Patient was counseled in prudent diet to achieve/maintain BMI less than 25 for weight control, BP monitoring, regular exercise and medications. Discussed med's effects and SE's. Screening labs and  tests as requested with regular follow-up as recommended. Over 40 minutes of exam, counseling, chart review and high complex critical decision making was performed.

## 2017-06-05 NOTE — Patient Instructions (Signed)

## 2017-06-06 ENCOUNTER — Other Ambulatory Visit: Payer: Self-pay | Admitting: Internal Medicine

## 2017-06-06 DIAGNOSIS — Z1231 Encounter for screening mammogram for malignant neoplasm of breast: Secondary | ICD-10-CM

## 2017-06-06 DIAGNOSIS — E0590 Thyrotoxicosis, unspecified without thyrotoxic crisis or storm: Secondary | ICD-10-CM

## 2017-06-06 DIAGNOSIS — N390 Urinary tract infection, site not specified: Secondary | ICD-10-CM | POA: Diagnosis not present

## 2017-06-06 LAB — LIPID PANEL
Cholesterol: 167 mg/dL (ref <200)
HDL: 48 mg/dL — ABNORMAL LOW (ref >50)
LDL Cholesterol (Calc): 89 mg/dL (calc)
Non-HDL Cholesterol (Calc): 119 mg/dL (calc) (ref <130)
Total CHOL/HDL Ratio: 3.5 (calc) (ref <5.0)
Triglycerides: 198 mg/dL — ABNORMAL HIGH (ref <150)

## 2017-06-06 LAB — URINALYSIS, ROUTINE W REFLEX MICROSCOPIC
Bilirubin Urine: NEGATIVE
Hgb urine dipstick: NEGATIVE
Hyaline Cast: NONE SEEN /LPF
Ketones, ur: NEGATIVE
Nitrite: NEGATIVE
Protein, ur: NEGATIVE
Specific Gravity, Urine: 1.018 (ref 1.001–1.03)
WBC, UA: >=60 /HPF — AB (ref 0–5)
pH: <=5 (ref 5.0–8.0)

## 2017-06-06 LAB — CBC WITH DIFFERENTIAL/PLATELET
Basophils Absolute: 66 cells/uL (ref 0–200)
Basophils Relative: 0.6 %
Eosinophils Absolute: 352 cells/uL (ref 15–500)
Eosinophils Relative: 3.2 %
HCT: 38.2 % (ref 35.0–45.0)
Hemoglobin: 12.6 g/dL (ref 11.7–15.5)
Lymphs Abs: 2497 cells/uL (ref 850–3900)
MCH: 30.6 pg (ref 27.0–33.0)
MCHC: 33 g/dL (ref 32.0–36.0)
MCV: 92.7 fL (ref 80.0–100.0)
MPV: 12.2 fL (ref 7.5–12.5)
Monocytes Relative: 8.2 %
Neutro Abs: 7183 cells/uL (ref 1500–7800)
Neutrophils Relative %: 65.3 %
Platelets: 364 10*3/uL (ref 140–400)
RBC: 4.12 10*6/uL (ref 3.80–5.10)
RDW: 12.2 % (ref 11.0–15.0)
Total Lymphocyte: 22.7 %
WBC mixed population: 902 cells/uL (ref 200–950)
WBC: 11 10*3/uL — ABNORMAL HIGH (ref 3.8–10.8)

## 2017-06-06 LAB — BASIC METABOLIC PANEL WITH GFR
BUN/Creatinine Ratio: 21 (calc) (ref 6–22)
BUN: 28 mg/dL — ABNORMAL HIGH (ref 7–25)
CO2: 30 mmol/L (ref 20–32)
Calcium: 10.4 mg/dL (ref 8.6–10.4)
Chloride: 103 mmol/L (ref 98–110)
Creat: 1.34 mg/dL — ABNORMAL HIGH (ref 0.60–0.88)
GFR, Est African American: 42 mL/min/{1.73_m2} — ABNORMAL LOW (ref >=60)
GFR, Est Non African American: 36 mL/min/{1.73_m2} — ABNORMAL LOW (ref >=60)
Glucose, Bld: 219 mg/dL — ABNORMAL HIGH (ref 65–99)
Potassium: 4.4 mmol/L (ref 3.5–5.3)
Sodium: 142 mmol/L (ref 135–146)

## 2017-06-06 LAB — HEPATIC FUNCTION PANEL
AG Ratio: 1.5 (calc) (ref 1.0–2.5)
ALT: 12 U/L (ref 6–29)
AST: 21 U/L (ref 10–35)
Albumin: 4.3 g/dL (ref 3.6–5.1)
Alkaline phosphatase (APISO): 57 U/L (ref 33–130)
Bilirubin, Direct: 0.1 mg/dL (ref 0.0–0.2)
Globulin: 2.8 g/dL (calc) (ref 1.9–3.7)
Indirect Bilirubin: 0.3 mg/dL (calc) (ref 0.2–1.2)
Total Bilirubin: 0.4 mg/dL (ref 0.2–1.2)
Total Protein: 7.1 g/dL (ref 6.1–8.1)

## 2017-06-06 LAB — MICROALBUMIN / CREATININE URINE RATIO
Creatinine, Urine: 113 mg/dL (ref 20–275)
Microalb Creat Ratio: 15 mcg/mg creat (ref <30)
Microalb, Ur: 1.7 mg/dL

## 2017-06-06 LAB — TSH: TSH: 0.37 mIU/L — ABNORMAL LOW (ref 0.40–4.50)

## 2017-06-06 LAB — HEMOGLOBIN A1C
Hgb A1c MFr Bld: 8.4 % of total Hgb — ABNORMAL HIGH (ref <5.7)
Mean Plasma Glucose: 194 (calc)
eAG (mmol/L): 10.8 (calc)

## 2017-06-06 LAB — MAGNESIUM: Magnesium: 1.8 mg/dL (ref 1.5–2.5)

## 2017-06-06 NOTE — Addendum Note (Signed)
Addended by: Eulis Canner on: 06/06/2017 02:02 PM   Modules accepted: Orders

## 2017-06-08 ENCOUNTER — Other Ambulatory Visit: Payer: Self-pay | Admitting: Internal Medicine

## 2017-06-08 DIAGNOSIS — N390 Urinary tract infection, site not specified: Secondary | ICD-10-CM

## 2017-06-08 LAB — URINE CULTURE
MICRO NUMBER:: 90188673
SPECIMEN QUALITY:: ADEQUATE

## 2017-06-08 MED ORDER — SULFAMETHOXAZOLE-TRIMETHOPRIM 800-160 MG PO TABS: ORAL_TABLET | ORAL | 0 refills | Status: DC

## 2017-06-09 ENCOUNTER — Other Ambulatory Visit: Payer: Self-pay | Admitting: Internal Medicine

## 2017-06-12 DIAGNOSIS — H43811 Vitreous degeneration, right eye: Secondary | ICD-10-CM | POA: Diagnosis not present

## 2017-06-12 DIAGNOSIS — H35351 Cystoid macular degeneration, right eye: Secondary | ICD-10-CM | POA: Diagnosis not present

## 2017-06-12 DIAGNOSIS — H353211 Exudative age-related macular degeneration, right eye, with active choroidal neovascularization: Secondary | ICD-10-CM | POA: Diagnosis not present

## 2017-06-12 DIAGNOSIS — E119 Type 2 diabetes mellitus without complications: Secondary | ICD-10-CM | POA: Diagnosis not present

## 2017-06-23 DIAGNOSIS — R6881 Early satiety: Secondary | ICD-10-CM | POA: Diagnosis not present

## 2017-06-23 DIAGNOSIS — M47816 Spondylosis without myelopathy or radiculopathy, lumbar region: Secondary | ICD-10-CM | POA: Diagnosis not present

## 2017-06-27 ENCOUNTER — Other Ambulatory Visit: Payer: Self-pay | Admitting: Neurosurgery

## 2017-06-28 ENCOUNTER — Ambulatory Visit
Admission: RE | Admit: 2017-06-28 | Discharge: 2017-06-28 | Disposition: A | Discharge status: Home / Self Care | Payer: Medicare Other | Source: Ambulatory Visit | Attending: Neurosurgery | Admitting: Neurosurgery

## 2017-06-28 ENCOUNTER — Ambulatory Visit: Payer: Medicare Other

## 2017-06-28 ENCOUNTER — Ambulatory Visit
Admission: RE | Admit: 2017-06-28 | Discharge: 2017-06-28 | Disposition: A | Discharge status: Home / Self Care | Payer: Medicare Other | Source: Ambulatory Visit | Attending: Internal Medicine | Admitting: Internal Medicine

## 2017-06-28 ENCOUNTER — Other Ambulatory Visit: Payer: Self-pay | Admitting: Neurosurgery

## 2017-06-28 DIAGNOSIS — K869 Disease of pancreas, unspecified: Secondary | ICD-10-CM

## 2017-06-28 DIAGNOSIS — Z1231 Encounter for screening mammogram for malignant neoplasm of breast: Secondary | ICD-10-CM

## 2017-06-28 DIAGNOSIS — R1013 Epigastric pain: Secondary | ICD-10-CM | POA: Diagnosis not present

## 2017-07-02 IMAGING — CT CT CHEST W/O CM
3 of 4 series · 17 of 30 positions shown, 19 images · non-contrast
Comparison: 12/21/2015

CLINICAL DATA: Abnormal chest x-ray with weight loss

EXAM:
CT CHEST WITHOUT CONTRAST
TECHNIQUE: Multidetector CT imaging of the chest was performed following the
standard protocol without IV contrast.

[Series 3: chest w/o · axial · non-contrast · 0.65mm/px · z∈[-210,-18]mm · 6 of 109 slices shown]
[im 16/109  lung]
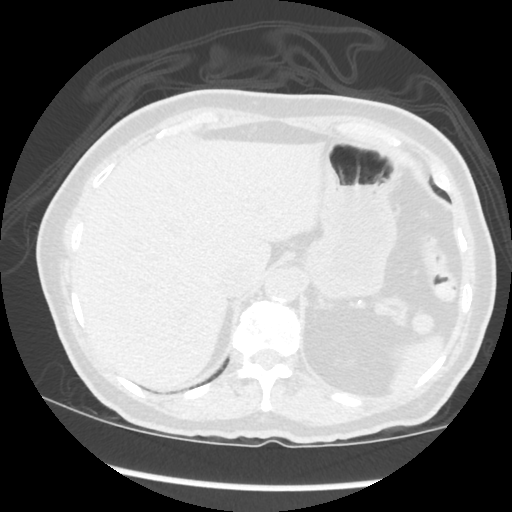
[im 31/109  lung]
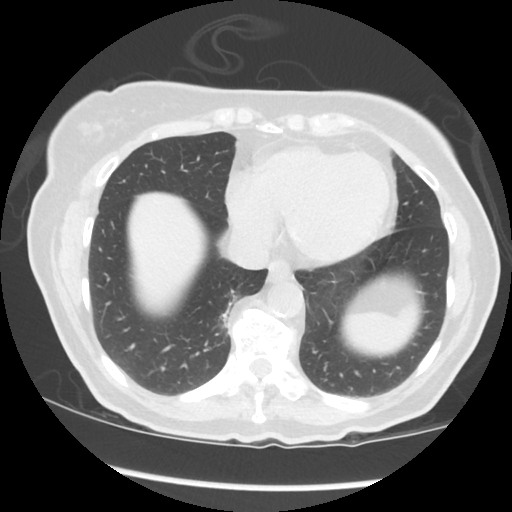
[im 47/109  lung]
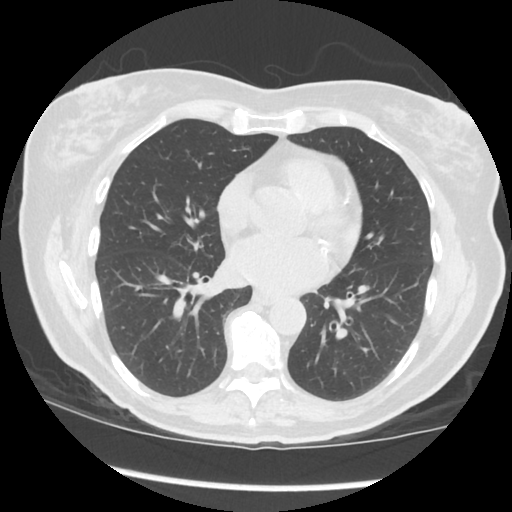
[im 62/109  lung]
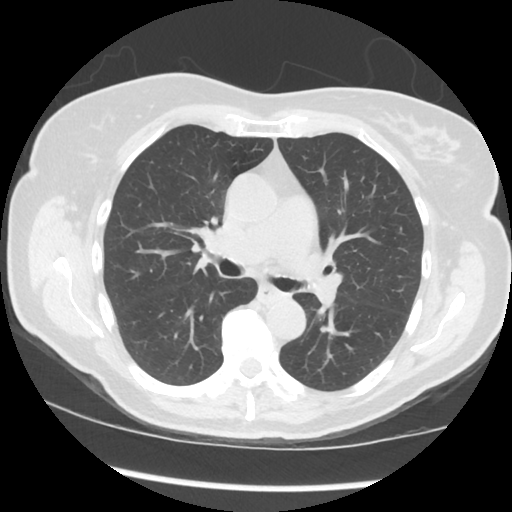
[im 78/109  lung]
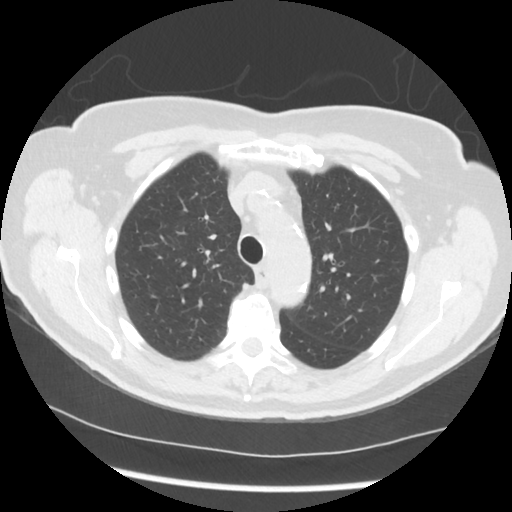
[im 93/109  lung]
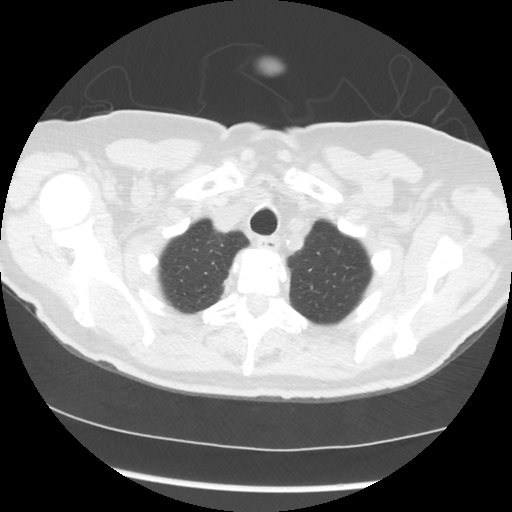

[Series 4: lung windows · axial · 0.65mm/px · z∈[-215,-12]mm · 7 of 109 slices shown, 9 images]
[im 14/109  mediastinal]
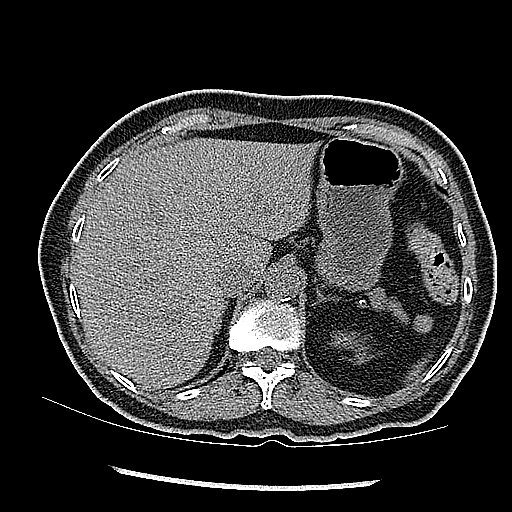
[im 14/109  lung]
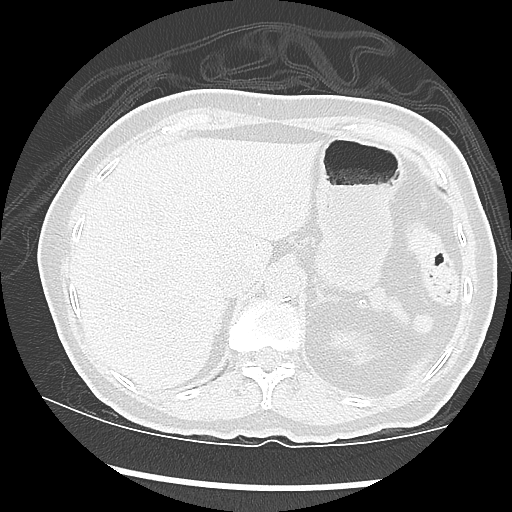
[im 28/109  lung]
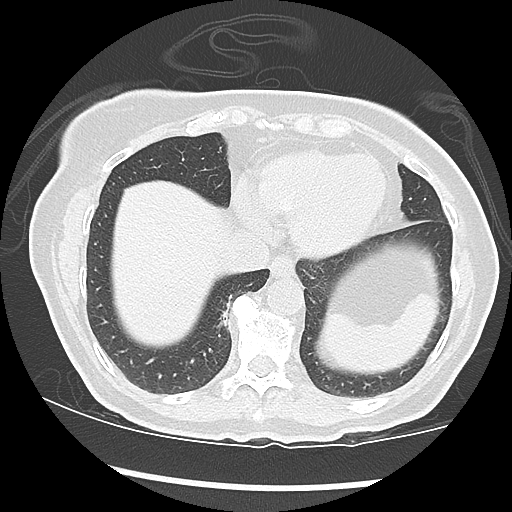
[im 41/109  lung]
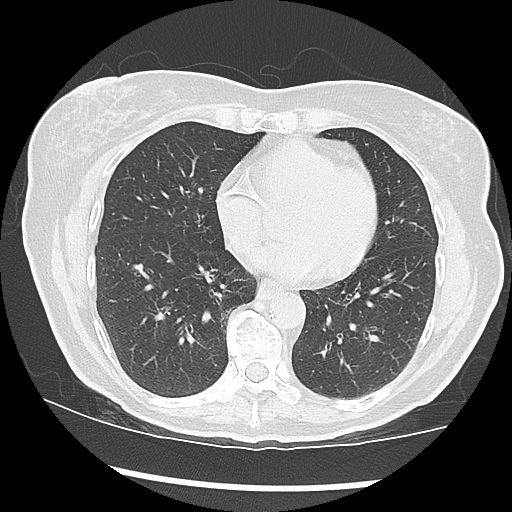
[im 55/109  lung]
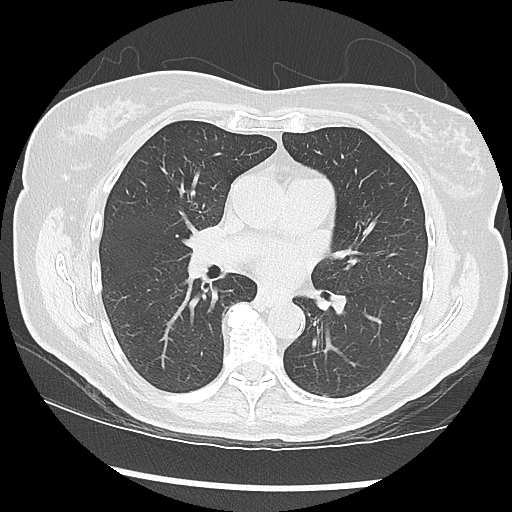
[im 68/109  mediastinal]
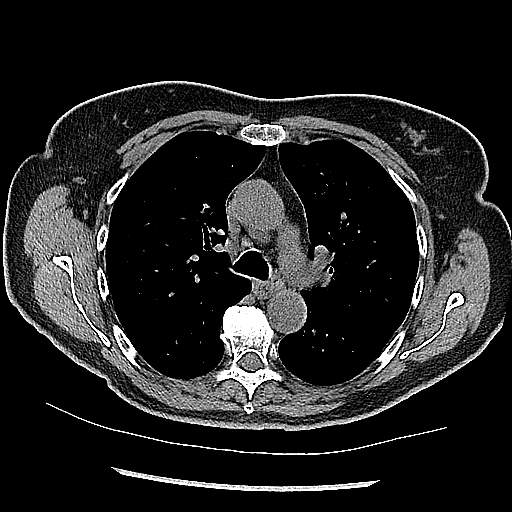
[im 68/109  lung]
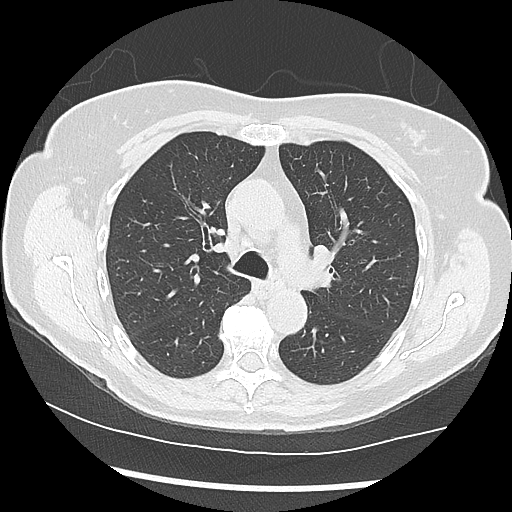
[im 82/109  lung]
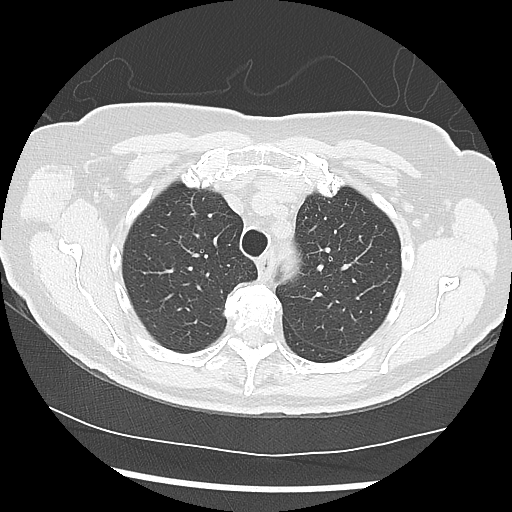
[im 95/109  lung]
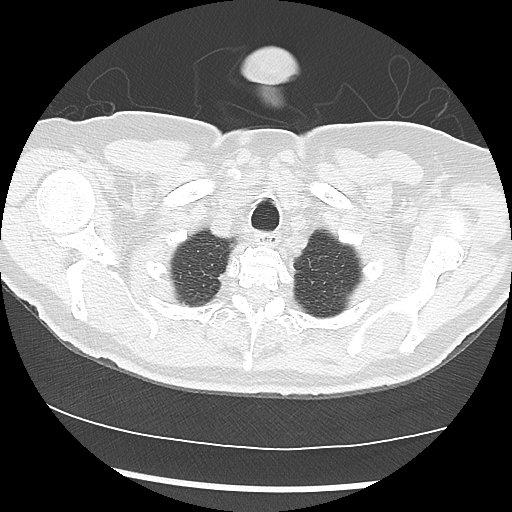

[Series 602: sagittal body · sagittal · 0.65mm/px · 4 of 135 slices shown]
[im 14/135  mediastinal]
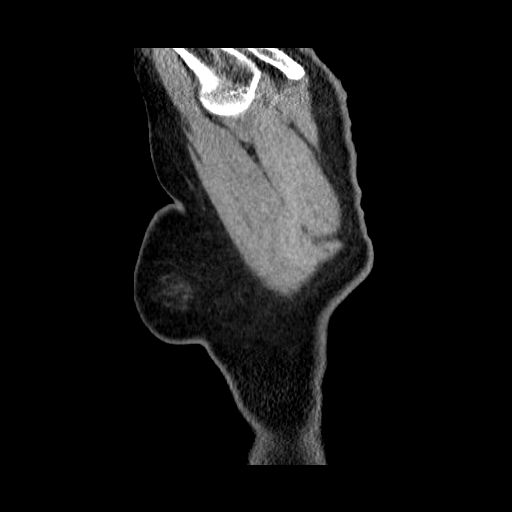
[im 27/135  mediastinal]
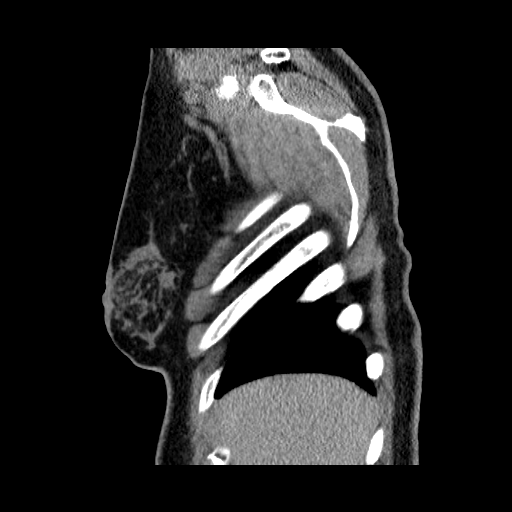
[im 41/135  mediastinal]
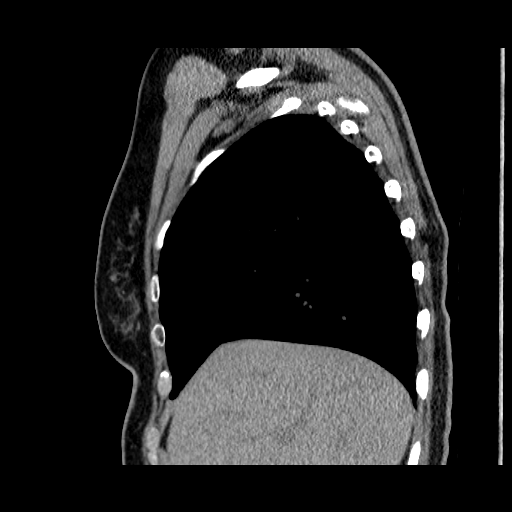
[im 54/135  mediastinal]
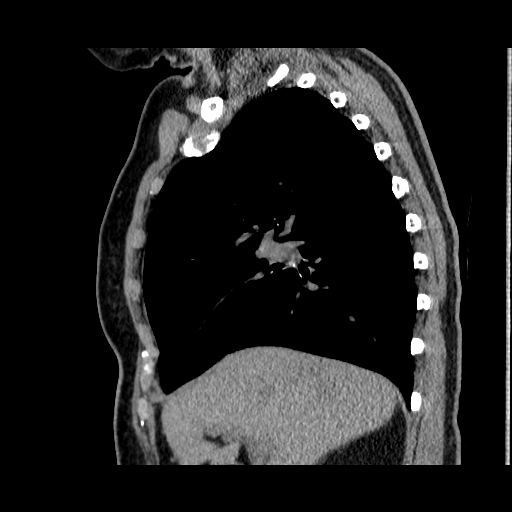

[17 of 30 positions shown; findings below may reference images not displayed]

FINDINGS: Cardiovascular: Somewhat limited due the lack of IV contrast. Aortic
calcifications are seen without aneurysmal dilatation. Diffuse
coronary calcifications are noted. The cardiac structures appear
within normal limits.

Mediastinum/Nodes: The thoracic inlet is within normal limits. No
significant hilar or mediastinal adenopathy is noted. No axillary
adenopathy is seen.

Lungs/Pleura: Lungs are well aerated bilaterally without focal
infiltrate or sizable effusion. No focal mass lesion is seen. The
previously seen lingular abnormality is no longer identified.

Upper Abdomen: Within normal limits.

Musculoskeletal: Degenerative changes of the thoracic spine are
noted. No acute bony abnormality is seen.
IMPRESSION: No acute abnormality is noted. The previously seen lingular
infiltrate has resolved in the interval.

## 2017-07-05 ENCOUNTER — Ambulatory Visit (INDEPENDENT_AMBULATORY_CARE_PROVIDER_SITE_OTHER): Payer: Medicare Other | Admitting: Adult Health

## 2017-07-05 ENCOUNTER — Encounter: Payer: Self-pay | Admitting: Adult Health

## 2017-07-05 VITALS — BP 126/50 | HR 58 | Temp 97.0°F | Ht 63.5 in | Wt 118.0 lb

## 2017-07-05 DIAGNOSIS — N390 Urinary tract infection, site not specified: Secondary | ICD-10-CM | POA: Diagnosis not present

## 2017-07-05 DIAGNOSIS — I6623 Occlusion and stenosis of bilateral posterior cerebral arteries: Secondary | ICD-10-CM

## 2017-07-05 DIAGNOSIS — E0590 Thyrotoxicosis, unspecified without thyrotoxic crisis or storm: Secondary | ICD-10-CM

## 2017-07-05 DIAGNOSIS — I129 Hypertensive chronic kidney disease with stage 1 through stage 4 chronic kidney disease, or unspecified chronic kidney disease: Secondary | ICD-10-CM

## 2017-07-05 DIAGNOSIS — IMO0001 Reserved for inherently not codable concepts without codable children: Secondary | ICD-10-CM

## 2017-07-05 DIAGNOSIS — E1122 Type 2 diabetes mellitus with diabetic chronic kidney disease: Secondary | ICD-10-CM

## 2017-07-05 DIAGNOSIS — R42 Dizziness and giddiness: Secondary | ICD-10-CM | POA: Diagnosis not present

## 2017-07-05 NOTE — Progress Notes (Signed)
Assessment and Plan:  [name] was seen today for dizziness and weakness.  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 Will defer repeat MRA at this time as would likely not change plan of care.  Continue ASA -   Urinary tract infection without hematuria, site unspecified Follow up UA today for recent infection -  -     Urine Culture -     Urinalysis, Routine w reflex microscopic  Hyperthyroidism -     TSH  Type 2 DM with CKD and hypertension (HCC) Glucose elevated overall - taking glipizide incorrectly; discussed taking TID today as originally prescribed - advised to take 1/2 tab for glucose 130-180, full tab for glucose over 180, not to take if not eating or below 130. Hypoglycemic symptoms and appropriate response discussed. Patient to call if ongoing concerns with hyperglycemia/hypoglycemia - otherwise follow up as scheduled.    Further disposition pending results of labs. Discussed med's effects and SE's.   Over 30 minutes of exam, counseling, chart review, and critical decision making was performed.   Future Appointments  Date Time Provider [place]  09/13/2017 11:00 AM Liane Comber, NP [facility] None  12/20/2017 11:00 AM [name], MD [facility] None  07/05/2018 10:00 AM [name], MD [facility] None    ------------------------------------------------------------------------------------------------------------------   HPI BP (!) 126/50   Pulse (!) 58   Temp (!) 97 F (36.1 C)   Ht 5' 3.5" (1.613 m)   Wt 118 lb (53.5 kg)   SpO2 98%   BMI 20.57  kg/m   84 y.o.female with hx of htn, ASHD, T2DM with CKD/neuropathy presented today for lab-only for urine and TSH recheck after recent UTI treated by septra DS x 10 days, and TSH was noted to be 0.37 on 06/05/2017 (not on thyroid medication) - and c/o generally not feeling well and somewhat dizzy for 3 weeks or more, not associated with position changes or other notable triggers. She reports poor appetite, has been sleeping more, she also reports new vague frontal headaches. BP noted to be 126/50 today- currently treated by bisoprolol/hctz 5-6.25 mg - she has been taking just a 1/2 tab of this "for a while" - reports she does not check BPs at home. No recent changes to BP meds -  Orthostatics: supine 130/62, P 52, sitting 132/50, P 61, standing 138/60, P 62  She reports Mr. Melford Aase recently adjusted diabetic medications; was on metformin and insulin (13 units of lantus, sliding scale lispro PRN - reports needed very seldomly, fasting ranged 100-130) was discontinued and she was started on glipizide 2.5-5mg  TID- (recent fasting 145-220) - however she has only been taking 5 mg once daily.   She is not a smoker, is on ASA  On review of chart it appears dizziness is intermittently ongoing with hx of moderate posterior cerebral artery stenosis by MRA in 2014 - results as follows:   Findings: No significant stenosis of the internal carotid arteries or M1 segment of the middle cerebral arteries.  Moderate narrowing A1 segment right anterior cerebral artery.  Moderate narrowing middle cerebral artery branch vessels bilaterally.  Slightly irregular mildly ectatic vertebral arteries and  basilar artery without high-grade stenosis.  Nonvisualization AICAs.  Narrowing proximal left superior cerebellar artery.  Moderate narrowing proximal posterior cerebral artery bilaterally. High-grade stenosis P2-P3 segment right posterior cerebral artery. Posterior cerebral artery branch vessel irregularity  and narrowing bilaterally.  Past Medical History:  Diagnosis Date  . Arthritis   . Chronic back pain   . Diabetic neuropathy ([place])   . Hypercholesteremia   . Hypertension   . Liver lesion   . Neuropathy   . Type II or unspecified type diabetes mellitus without mention of complication, not stated as uncontrolled   . Vitamin D deficiency   . Wears glasses      Allergies  Allergen Reactions  . Morphine And Related Other (See Comments)    fuzzy feeling    Current Outpatient Medications on File Prior to Visit  Medication Sig  . ACCU-CHEK AVIVA PLUS test strip Test 3 times a day  . aspirin 81 MG tablet Take 81 mg by mouth daily.  . ASSURE COMFORT LANCETS 30G MISC Check blood sugar 3 times daily-DX-E11.22  . bisoprolol-hydrochlorothiazide (ZIAC) 5-6.25 MG tablet TAKE ONE TABLET BY MOUTH DAILY FOR FOR BLOOD PRESSURE  . Blood Glucose Calibration (ACCU-CHEK AVIVA) SOLN Check blood sugar 3 times daily-DX-E11.22  . Blood Glucose Monitoring Suppl (ACCU-CHEK AVIVA) device Use as instructed  . Cholecalciferol (VITAMIN D3) 2000 UNITS TABS Take 1 capsule by mouth 2 (two) times daily.  . dorzolamide-timolol (COSOPT) 22.3-6.8 MG/ML ophthalmic solution   . fenofibrate micronized (LOFIBRA) 134 MG capsule TAKE ONE CAPSULE BY MOUTH  DAILY FOR BLOOD FATS  . glipiZIDE (GLUCOTROL) 5 MG tablet Take 1/2 to 1 tablet 3 x / day before meals  . latanoprost (XALATAN) 0.005 % ophthalmic solution   . LOTEMAX 0.5 % GEL   . Magnesium 500 MG CAPS Take 500 mg by mouth 2 (two) times daily.  . metFORMIN (GLUCOPHAGE) 1000 MG tablet TAKE ONE TABLET BY MOUTH TWICE A DAY AFTER MEALS FOR DIABETES  . Multiple Vitamins-Minerals (HAIR/SKIN/NAILS) TABS Take by mouth 2 (two) times daily.  . pravastatin (PRAVACHOL) 40 MG tablet TAKE ONE TABLET BY MOUTH AT BEDTIME FOR CHOLESTEROL  . rOPINIRole (REQUIP) 3 MG tablet TAKE 1 TABLET BY MOUTH TWICE DAILY AND TAKE TWO TABLETS AT BEDTIME FOR RESTLESS LEGS  . traMADol (ULTRAM) 50 MG  tablet Take 2 tablets (100 mg total) by mouth every 6 (six) hours as needed.  . triamcinolone ointment (KENALOG) 0.1 % Apply 1 application topically 2 (two) times daily.  Marland Kitchen EASY COMFORT INSULIN SYRINGE 30G X 1/2" 0.5 ML MISC   . insulin lispro (HUMALOG) 100 UNIT/ML injection Inject 0.05-0.1 mLs (5-10 Units total) 3 (three) times daily with meals into the skin. Patient takes Rx on a sliding scale basis (Patient not taking: Reported on 07/05/2017)  . LANTUS SOLOSTAR 100 UNIT/ML Solostar Pen INJECT 30 UNITS INTO THE SKIN AT BEDTIME OR AS DIRECTED TO CONTROL BLOOD SUGARS (Patient not taking: Reported on 07/05/2017)  . sulfamethoxazole-trimethoprim (BACTRIM DS,SEPTRA DS) 800-160 MG tablet Take 1 tablet 2 x / day with food for UTI. (Patient not taking: Reported on 07/05/2017)   No current facility-administered medications on file prior to visit.     ROS: Review of Systems  Constitutional: Positive for malaise/fatigue. Negative for chills, diaphoresis, fever and weight loss.  HENT: Negative for hearing loss and tinnitus.   Eyes: Negative for blurred vision and double vision.  Respiratory: Negative for cough, sputum production, shortness of breath and wheezing.   Cardiovascular: Negative for chest pain,  palpitations, orthopnea, claudication, leg swelling and PND.  Gastrointestinal: Negative for abdominal pain, blood in stool, constipation, diarrhea, heartburn, melena, nausea and vomiting.  Genitourinary: Negative.  Negative for dysuria, flank pain, frequency, hematuria and urgency.  Musculoskeletal: Negative for falls, joint pain and myalgias.  Skin: Negative for rash.  Neurological: Positive for dizziness, weakness ("low energy") and headaches (Mild, frontal). Negative for tingling and sensory change.  Endo/Heme/Allergies: Negative for polydipsia.  Psychiatric/Behavioral: Negative.  Negative for depression, memory loss, substance abuse and suicidal ideas. The patient is not nervous/anxious and does not  have insomnia.   All other systems reviewed and are negative.    Physical Exam:  BP (!) 126/50   Pulse (!) 58   Temp (!) 97 F (36.1 C)   Ht 5' 3.5" (1.613 m)   Wt 118 lb (53.5 kg)   SpO2 98%   BMI 20.57 kg/m   General Appearance: Well nourished, in no apparent/acute distress. Eyes: PERRLA, EOMs, conjunctiva no swelling or erythema Sinuses: No Frontal/maxillary tenderness ENT/Mouth: Ext aud canals clear, TMs without erythema, bulging. No erythema, swelling, or exudate on post pharynx.  Tonsils not swollen or erythematous. Hearing normal.  Neck: Supple, thyroid normal.  Respiratory: Respiratory effort normal, BS equal bilaterally without rales, rhonchi, wheezing or stridor.  Cardio: RRR with no MRGs. Brisk peripheral pulses without edema.  Abdomen: Soft, + BS.  Non tender, no guarding, rebound, hernias, masses. Lymphatics: Non tender without lymphadenopathy.  Musculoskeletal: Full ROM, 5/5 strength, normal gait.  Skin: Warm, dry without rashes, lesions, ecchymosis.  Neuro: Cranial nerves intact. Normal muscle tone, no cerebellar symptoms. Sensation intact.  Psych: Awake and oriented X 3, normal affect, Insight and Judgment appropriate.     Izora Ribas, NP 12:56 PM [facility]

## 2017-07-05 NOTE — Patient Instructions (Addendum)
Glipizide - take with each meal - if blood sugar between 130-180 take 1/2 tab, if above 180 take a whole. Skip if below 130 or you are not eating.   Start checking blood pressure -   Please monitor your blood pressure, as we get older our body can not respond to a low blood pressure as well as it did when we were younger, for this reason we want a bit higher of a blood pressure as you get older to avoid dizziness and fatigue which can lead to falls.  You reported today that you are only taking 1/2 a tab of bisoprolol/hctz 5-6.25 - please start checking your blood pressure daily, and try stopping blood pressure to see if this helps with the dizziness.   [place] call if your blood pressure is consistently above 150/90 and restart taking blood pressure medication.   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   Dizziness Dizziness is a common problem. It is a feeling of unsteadiness or light-headedness. You may feel like you are about to faint. Dizziness can lead to injury if you stumble or fall. Anyone can become dizzy, but dizziness is more common in older adults. This condition can be caused by a number of things, including medicines, dehydration, or illness. Follow these instructions at home: Eating and drinking  Drink enough fluid to keep your urine clear or pale yellow. This helps to keep you from becoming dehydrated. Try to drink more clear fluids, such as water.  Do not drink alcohol.  Limit your caffeine intake if told to do so by your health care provider. Check ingredients and nutrition facts to see if a food or beverage contains caffeine.  Limit your salt (sodium) intake if told to do so by your health care provider. Check ingredients and nutrition facts to see if a food or beverage contains  sodium. Activity  Avoid making quick movements. ? Rise slowly from chairs and steady yourself until you feel okay. ? In the morning, first sit up on the side of the bed. When you feel okay, stand slowly while you hold onto something until you know that your balance is fine.  If you need to stand in one place for a long time, move your legs often. Tighten and relax the muscles in your legs while you are standing.  Do not drive or use heavy machinery if you feel dizzy.  Avoid bending down if you feel dizzy. Place items in your home so that they are easy for you to reach without leaning over. Lifestyle  Do not use any products that contain nicotine or tobacco, such as cigarettes and e-cigarettes. If you need help quitting, ask your health care provider.  Try to reduce your stress level by using methods such as yoga or meditation. Talk with your health care provider if you need help to manage your stress. General instructions  Watch your dizziness for any changes.  Take over-the-counter and prescription medicines only as told by your health care provider. Talk with your health care provider if you think that your dizziness is caused by a medicine that you are taking.  Tell a friend or a family member that you are feeling dizzy. If he or she notices any changes in your behavior, have this person call your health care provider.  Keep all follow-up visits as told by your health care provider. This is important. Contact a health care provider if:  Your dizziness does not go away.  Your dizziness or light-headedness gets worse.  You feel nauseous.  You have reduced hearing.  You have new symptoms.  You are unsteady on your feet or you feel like the room is spinning. Get help right away if:  You vomit or have diarrhea and are unable to eat or drink anything.  You have problems talking, walking, swallowing, or using your arms, hands, or legs.  You feel generally weak.  You are not  thinking clearly or you have trouble forming sentences. It may take a friend or family member to notice this.  You have chest pain, abdominal pain, shortness of breath, or sweating.  Your vision changes.  You have any bleeding.  You have a severe headache.  You have neck pain or a stiff neck.  You have a fever. These symptoms may represent a serious problem that is an emergency. Do not wait to see if the symptoms will go away. Get medical help right away. Call your local emergency services (911 in the [place] Do not drive yourself to the hospital. Summary  Dizziness is a feeling of unsteadiness or light-headedness. This condition can be caused by a number of things, including medicines, dehydration, or illness.  Anyone can become dizzy, but dizziness is more common in older adults.  Drink enough fluid to keep your urine clear or pale yellow. Do not drink alcohol.  Avoid making quick movements if you feel dizzy. Monitor your dizziness for any changes. This information is not intended to replace advice given to you by your health care provider. Make sure you discuss any questions you have with your health care provider. Document Released: 10/05/2000 Document Revised: 05/14/2016 Document Reviewed: 05/14/2016 Elsevier Interactive Patient Education  [employer].

## 2017-07-06 ENCOUNTER — Other Ambulatory Visit: Payer: Self-pay | Admitting: *Deleted

## 2017-07-06 DIAGNOSIS — H04123 Dry eye syndrome of bilateral lacrimal glands: Secondary | ICD-10-CM | POA: Diagnosis not present

## 2017-07-06 DIAGNOSIS — H401122 Primary open-angle glaucoma, left eye, moderate stage: Secondary | ICD-10-CM | POA: Diagnosis not present

## 2017-07-06 DIAGNOSIS — H401111 Primary open-angle glaucoma, right eye, mild stage: Secondary | ICD-10-CM | POA: Diagnosis not present

## 2017-07-06 DIAGNOSIS — H353231 Exudative age-related macular degeneration, bilateral, with active choroidal neovascularization: Secondary | ICD-10-CM | POA: Diagnosis not present

## 2017-07-06 DIAGNOSIS — Z961 Presence of intraocular lens: Secondary | ICD-10-CM | POA: Diagnosis not present

## 2017-07-06 LAB — URINALYSIS, ROUTINE W REFLEX MICROSCOPIC
Bacteria, UA: NONE SEEN /HPF
Bilirubin Urine: NEGATIVE
Hgb urine dipstick: NEGATIVE
Hyaline Cast: NONE SEEN /LPF
Ketones, ur: NEGATIVE
Nitrite: NEGATIVE
Protein, ur: NEGATIVE
RBC / HPF: NONE SEEN /HPF (ref 0–2)
Specific Gravity, Urine: 1.015 (ref 1.001–1.03)
Squamous Epithelial / HPF: NONE SEEN /HPF (ref <=5)
pH: <=5 (ref 5.0–8.0)

## 2017-07-06 LAB — URINE CULTURE
MICRO NUMBER:: 90321319
Result:: NO GROWTH
SPECIMEN QUALITY:: ADEQUATE

## 2017-07-06 LAB — TSH: TSH: 0.58 mIU/L (ref 0.40–4.50)

## 2017-07-06 MED ORDER — GLIPIZIDE 5 MG PO TABS: ORAL_TABLET | ORAL | 2 refills | Status: DC

## 2017-07-13 ENCOUNTER — Telehealth: Payer: Self-pay

## 2017-07-13 DIAGNOSIS — M47816 Spondylosis without myelopathy or radiculopathy, lumbar region: Secondary | ICD-10-CM | POA: Diagnosis not present

## 2017-07-13 NOTE — Telephone Encounter (Signed)
Patient is asking if she should go back on her BP meds? BP was 157/85 today

## 2017-07-14 NOTE — Telephone Encounter (Signed)
Patient will monitor her BP over the weekend and will call us on Monday with her readings,

## 2017-07-18 NOTE — Telephone Encounter (Signed)
LMTCB, following up about BP readings.

## 2017-07-20 NOTE — Telephone Encounter (Signed)
Patient called with BP readings from yesterday and today. Yesterday was 157/85 and Today was 154/83.  Said she will call next week with more readings.

## 2017-07-20 NOTE — Telephone Encounter (Signed)
Patient aware and understood instructions.

## 2017-07-24 NOTE — Telephone Encounter (Signed)
Patient is concerned about her BP reading at 141/86. Patient advised to continue to monitor her BP and anything over 150 to call us.

## 2017-08-01 ENCOUNTER — Telehealth: Payer: Self-pay | Admitting: *Deleted

## 2017-08-01 NOTE — Telephone Encounter (Signed)
Information regarding the patient's medication and DX sent to [facility], that provides her diabetic supplies at [phone]).

## 2017-08-02 NOTE — Progress Notes (Signed)
Assessment and Plan:  [name] was seen today for medication management.  Diagnoses and all orders for this visit:  Type 2 DM with CKD and hypertension ([place]) Discussed medications at length; emphasized appropriate glipizide use - she is able to repeat appropriate parameters back. Instructions further provided on AVS.  She is to keep a log of  BP, TID blood sugar checks, glipizide use, insulin dose and food log and present at follow up as scheduled She is agreeable to stopping nightly cookies and ice cream sandwiches; alternatively suggested fresh fruit with peanut butter She may call with any questions or concerns, may follow up sooner if needed  Essential hypertension Continue with 1/2 tab ziac to maintain higher BP; goal to maintain under 150/90 Monitor blood pressure at home; call if consistently elevated, or repeated lower values with dizziness Continue DASH diet.   Reminder to go to the ER if any CP, SOB, nausea, dizziness, severe HA, changes vision/speech, left arm numbness and tingling and jaw pain.  Further disposition pending results of labs. Discussed med's effects and SE's.   Over 30 minutes of exam, counseling, chart review, and critical decision making was performed.   Future Appointments  Date Time Provider [place]  09/13/2017 11:00 AM Liane Comber, NP [facility] None  12/20/2017 11:00 AM [name], MD [facility] None  07/05/2018 10:00 AM [name], MD [facility] None    ------------------------------------------------------------------------------------------------------------------   HPI BP 138/60   Pulse (!) 55   Temp (!) 97.3 F (36.3 C)   Ht 5' 3.5" (1.613 m)   Wt 115 lb (52.2 kg)   SpO2 97%   BMI 20.05 kg/m   84 y.o.female with history of htn and poorly controlled diabetes presents for request to "review medications" - she presents with a very sporadic log of blood pressure and blood sugars today. At our previous visit, she was c/o  dizziness and ziac was reduced to 1/2 tab to maintain BPs somewhat higher due to hx of stenosis of bilateral posterior cerebral arteries. She reports dizziness has fully resolved after this adjustment, presents sporadic log with values averaging in 130-140s/70s with rare outliers.   T2DM is poorly controlled (most recent A1C 8.4% is improved from previous 10.8%) secondary to poor comprehension of disease process and consequent adherence issues with medication regimen; she was advised to start taking glipizide TID at last visit - to take 1/2 tab for glucose ranging 130-180, and full tab for 180+. She reports she has taken full tab if she is at home to take her blood sugar, but has not taken it if out, and has not taken 1/2 tab if below 180. She does report to consistently taking 1000 mg metformin BID as well as 14 units of lantus nightly. She presents with 2-3 "fasting" blood sugars logged; these are ranging 300-325. Discussed night time routine, and she has been eating 2 cookies at night as well as an ice cream sandwich.   Past Medical History:  Diagnosis Date  . Arthritis   . Chronic back pain   . Diabetic neuropathy ([place])   . Hypercholesteremia   . Hypertension   . Liver lesion   . Neuropathy   . Type II or unspecified type diabetes mellitus without mention of complication, not stated as uncontrolled   . Vitamin D deficiency   . Wears glasses      Allergies  Allergen Reactions  . Morphine And Related Other (See Comments)    fuzzy feeling    Current Outpatient Medications on  File Prior to Visit  Medication Sig  . ACCU-CHEK AVIVA PLUS test strip Test 3 times a day  . aspirin 81 MG tablet Take 81 mg by mouth daily.  . ASSURE COMFORT LANCETS 30G MISC Check blood sugar 3 times daily-[id number]  . bisoprolol-hydrochlorothiazide (ZIAC) 5-6.25 MG tablet TAKE ONE TABLET BY MOUTH DAILY FOR FOR BLOOD PRESSURE (Patient taking differently: TAKE 1/2 TABLET BY MOUTH DAILY FOR FOR BLOOD PRESSURE)  .  Blood Glucose Calibration (ACCU-CHEK AVIVA) SOLN Check blood sugar 3 times daily-[id number]  . Blood Glucose Monitoring Suppl (ACCU-CHEK AVIVA) device Use as instructed  . Cholecalciferol (VITAMIN D3) 2000 UNITS TABS Take 1 capsule by mouth 2 (two) times daily.  . dorzolamide-timolol (COSOPT) 22.3-6.8 MG/ML ophthalmic solution   . EASY COMFORT INSULIN SYRINGE 30G X 1/2" 0.5 ML MISC   . fenofibrate micronized (LOFIBRA) 134 MG capsule TAKE ONE CAPSULE BY MOUTH  DAILY FOR BLOOD FATS  . glipiZIDE (GLUCOTROL) 5 MG tablet Take 1/2 to 1 tablet 3 x / day before meals  . insulin lispro (HUMALOG) 100 UNIT/ML injection Inject 0.05-0.1 mLs (5-10 Units total) 3 (three) times daily with meals into the skin. Patient takes Rx on a sliding scale basis  . LANTUS SOLOSTAR 100 UNIT/ML Solostar Pen INJECT 30 UNITS INTO THE SKIN AT BEDTIME OR AS DIRECTED TO CONTROL BLOOD SUGARS  . latanoprost (XALATAN) 0.005 % ophthalmic solution   . LOTEMAX 0.5 % GEL   . Magnesium 500 MG CAPS Take 500 mg by mouth 2 (two) times daily.  . metFORMIN (GLUCOPHAGE) 1000 MG tablet TAKE ONE TABLET BY MOUTH TWICE A DAY AFTER MEALS FOR DIABETES  . Multiple Vitamins-Minerals (HAIR/SKIN/NAILS) TABS Take by mouth 2 (two) times daily.  . pravastatin (PRAVACHOL) 40 MG tablet TAKE ONE TABLET BY MOUTH AT BEDTIME FOR CHOLESTEROL  . rOPINIRole (REQUIP) 3 MG tablet TAKE 1 TABLET BY MOUTH TWICE DAILY AND TAKE TWO TABLETS AT BEDTIME FOR RESTLESS LEGS  . sulfamethoxazole-trimethoprim (BACTRIM DS,SEPTRA DS) 800-160 MG tablet Take 1 tablet 2 x / day with food for UTI. (Patient not taking: Reported on 07/05/2017)  . triamcinolone ointment (KENALOG) 0.1 % Apply 1 application topically 2 (two) times daily. (Patient not taking: Reported on 08/03/2017)   No current facility-administered medications on file prior to visit.     ROS: all negative except above.   Physical Exam:  BP 138/60   Pulse (!) 55   Temp (!) 97.3 F (36.3 C)   Ht 5' 3.5" (1.613 m)   Wt  115 lb (52.2 kg)   SpO2 97%   BMI 20.05 kg/m   General Appearance: Well nourished, in no apparent distress. Eyes: PERRLA, conjunctiva no swelling or erythema ENT/Mouth: Hearing normal.  Neck: Supple.  Respiratory: Respiratory effort normal, BS equal bilaterally without rales, rhonchi, wheezing or stridor.  Cardio: RRR with no MRGs. Brisk peripheral pulses without edema.  Abdomen: Soft, + BS.  Non tender. Lymphatics: Non tender without lymphadenopathy.  Musculoskeletal: Symmetrical strength, normal gait.  Skin: Warm, dry without rashes, lesions, ecchymosis.  Psych: Awake and oriented X 3, normal affect, Insight and Judgment questionable.    Izora Ribas, NP 12:02 PM [facility]

## 2017-08-03 ENCOUNTER — Ambulatory Visit (INDEPENDENT_AMBULATORY_CARE_PROVIDER_SITE_OTHER): Payer: Medicare Other | Admitting: Adult Health

## 2017-08-03 ENCOUNTER — Encounter: Payer: Self-pay | Admitting: Adult Health

## 2017-08-03 VITALS — BP 138/60 | HR 55 | Temp 97.3°F | Ht 63.5 in | Wt 115.0 lb

## 2017-08-03 DIAGNOSIS — I10 Essential (primary) hypertension: Secondary | ICD-10-CM

## 2017-08-03 DIAGNOSIS — E1122 Type 2 diabetes mellitus with diabetic chronic kidney disease: Secondary | ICD-10-CM | POA: Diagnosis not present

## 2017-08-03 DIAGNOSIS — IMO0001 Reserved for inherently not codable concepts without codable children: Secondary | ICD-10-CM

## 2017-08-03 DIAGNOSIS — I129 Hypertensive chronic kidney disease with stage 1 through stage 4 chronic kidney disease, or unspecified chronic kidney disease: Secondary | ICD-10-CM | POA: Diagnosis not present

## 2017-08-03 NOTE — Patient Instructions (Signed)
Keep a log of blood pressure, sugars, glipizide dose, insulin and food  Take 1/2 tab of glipizide for blood sugar 130-180, take full tab for blood sugar above 180     Blood Glucose Monitoring, Adult Monitoring your blood sugar (glucose) helps you manage your diabetes. It also helps you and your health care provider determine how well your diabetes management plan is working. Blood glucose monitoring involves checking your blood glucose as often as directed, and keeping a record (log) of your results over time. Why should I monitor my blood glucose? Checking your blood glucose regularly can:  Help you understand how food, exercise, illnesses, and medicines affect your blood glucose.  Let you know what your blood glucose is at any time. You can quickly tell if you are having low blood glucose (hypoglycemia) or high blood glucose (hyperglycemia).  Help you and your health care provider adjust your medicines as needed.  When should I check my blood glucose? Follow instructions from your health care provider about how often to check your blood glucose. This may depend on:  The type of diabetes you have.  How well-controlled your diabetes is.  Medicines you are taking.  If you have type 1 diabetes:  Check your blood glucose at least 2 times a day.  Also check your blood glucose: ? Before every insulin injection. ? Before and after exercise. ? Between meals. ? 2 hours after a meal. ? Occasionally between 2:00 a.m. and 3:00 a.m., as directed. ? Before potentially dangerous tasks, like driving or using heavy machinery. ? At bedtime.  You may need to check your blood glucose more often, up to 6-10 times a day: ? If you use an insulin pump. ? If you need multiple daily injections (MDI). ? If your diabetes is not well-controlled. ? If you are ill. ? If you have a history of severe hypoglycemia. ? If you have a history of not knowing when your blood glucose is getting low  (hypoglycemia unawareness). If you have type 2 diabetes:  If you take insulin or other diabetes medicines, check your blood glucose at least 2 times a day.  If you are on intensive insulin therapy, check your blood glucose at least 4 times a day. Occasionally, you may also need to check between 2:00 a.m. and 3:00 a.m., as directed.  Also check your blood glucose: ? Before and after exercise. ? Before potentially dangerous tasks, like driving or using heavy machinery.  You may need to check your blood glucose more often if: ? Your medicine is being adjusted. ? Your diabetes is not well-controlled. ? You are ill. What is a blood glucose log?  A blood glucose log is a record of your blood glucose readings. It helps you and your health care provider: ? Look for patterns in your blood glucose over time. ? Adjust your diabetes management plan as needed.  Every time you check your blood glucose, write down your result and notes about things that may be affecting your blood glucose, such as your diet and exercise for the day.  Most glucose meters store a record of glucose readings in the meter. Some meters allow you to download your records to a computer. How do I check my blood glucose? Follow these steps to get accurate readings of your blood glucose: Supplies needed   Blood glucose meter.  Test strips for your meter. Each meter has its own strips. You must use the strips that come with your meter.  A needle  to prick your finger (lancet). Do not use lancets more than once.  A device that holds the lancet (lancing device).  A journal or log book to write down your results. Procedure  Wash your hands with soap and water.  Prick the side of your finger (not the tip) with the lancet. Use a different finger each time.  Gently rub the finger until a small drop of blood appears.  Follow instructions that come with your meter for inserting the test strip, applying blood to the strip,  and using your blood glucose meter.  Write down your result and any notes. Alternative testing sites  Some meters allow you to use areas of your body other than your finger (alternative sites) to test your blood.  If you think you may have hypoglycemia, or if you have hypoglycemia unawareness, do not use alternative sites. Use your finger instead.  Alternative sites may not be as accurate as the fingers, because blood flow is slower in these areas. This means that the result you get may be delayed, and it may be different from the result that you would get from your finger.  The most common alternative sites are: ? Forearm. ? Thigh. ? Palm of the hand. Additional tips  Always keep your supplies with you.  If you have questions or need help, all blood glucose meters have a 24-hour "hotline" number that you can call. You may also contact your health care provider.  After you use a few boxes of test strips, adjust (calibrate) your blood glucose meter by following instructions that came with your meter. This information is not intended to replace advice given to you by your health care provider. Make sure you discuss any questions you have with your health care provider. Document Released: 04/14/2003 Document Revised: 10/30/2015 Document Reviewed: 09/21/2015 Elsevier Interactive Patient Education  2017 [employer].

## 2017-08-08 DIAGNOSIS — H353212 Exudative age-related macular degeneration, right eye, with inactive choroidal neovascularization: Secondary | ICD-10-CM | POA: Diagnosis not present

## 2017-08-08 DIAGNOSIS — H353222 Exudative age-related macular degeneration, left eye, with inactive choroidal neovascularization: Secondary | ICD-10-CM | POA: Diagnosis not present

## 2017-08-08 DIAGNOSIS — H353113 Nonexudative age-related macular degeneration, right eye, advanced atrophic without subfoveal involvement: Secondary | ICD-10-CM | POA: Diagnosis not present

## 2017-08-08 DIAGNOSIS — H35351 Cystoid macular degeneration, right eye: Secondary | ICD-10-CM | POA: Diagnosis not present

## 2017-08-08 DIAGNOSIS — E119 Type 2 diabetes mellitus without complications: Secondary | ICD-10-CM | POA: Diagnosis not present

## 2017-09-07 NOTE — Progress Notes (Signed)
FOLLOW UP  Assessment and Plan:   Hypertension At goal with current medications  Monitor blood pressure at home; patient to call if consistently greater than 150/90 (maintain higher goal due to hx of stenosis with dizziness) Continue DASH diet.   Reminder to go to the ER if any CP, SOB, nausea, dizziness, severe HA, changes vision/speech, left arm numbness and tingling and jaw pain.  Cholesterol Currently above typical goal of LDL <70 for diabetic; continue statin Continue low cholesterol diet and exercise.  Check lipid panel.   Diabetes with diabetic chronic kidney disease Continue medication: metformin, glipizide, lantus - has not been taking glipizide despite extended discussion at last visit - discussed these parameters today again.  Continue diet and exercise.  Perform daily foot/skin check, notify office of any concerning changes.  Check A1C  BMI 20  Continue to recommend diet heavy in fruits and veggies and low in animal meats, cheeses, and dairy products, appropriate calorie intake Discuss exercise recommendations routinely Continue to monitor weight at each visit  Vitamin D Def At goal at last visit; continue supplementation to maintain goal of 70-100 Defer Vit D level to next visit with Dr. Melford Aase  Continue diet and meds as discussed. Further disposition pending results of labs. Discussed med's effects and SE's.   Over 30 minutes of exam, counseling, chart review, and critical decision making was performed.   Future Appointments  Date Time Provider [place]  12/20/2017 11:00 AM [name], MD [facility] None  07/05/2018 10:00 AM [name], MD [facility] None    ----------------------------------------------------------------------------------------------------------------------  HPI 84 y.o. female  presents for 3 month follow up on hypertension, cholesterol, diabetes, weight and vitamin D deficiency.   BMI is Body mass index is 20.4 kg/m.,  she has been working on diet and exercise. Wt Readings from Last 3 Encounters:  09/08/17 117 lb (53.1 kg)  08/03/17 115 lb (52.2 kg)  07/05/17 118 lb (53.5 kg)   Her blood pressure has been controlled at home (130-140/80s), today their BP is BP: (!) 110/50  She does workout. She denies chest pain, shortness of breath, dizziness.   She is on cholesterol medication and denies myalgias. Her cholesterol is not at goal of LDL <70 but not aggressively pursued in light of advanced age. The cholesterol last visit was:   Lab Results  Component Value Date   CHOL 167 06/05/2017   HDL 48 (L) 06/05/2017   LDLCALC 89 06/05/2017   TRIG 198 (H) 06/05/2017   CHOLHDL 3.5 06/05/2017    She has been working on diet and exercise for T2 diabetes, and denies foot ulcerations, increased appetite, nausea, paresthesia of the feet, polydipsia, polyuria, visual disturbances, vomiting and weight loss. She does check a fasting glucose and presents a log demonstrating values much improved from last time ranging from 83-200, reports she has cut out night time snacking completely. Last A1C in the office was:  Lab Results  Component Value Date   HGBA1C 8.4 (H) 06/05/2017   Patient is on Vitamin D supplement and at goal at last check:   Lab Results  Component Value Date   VD25OH 81 05/03/2016      Current Medications:  Current Outpatient Medications on File Prior to Visit  Medication Sig  . ACCU-CHEK AVIVA PLUS test strip Test 3 times a day  . aspirin 81 MG tablet Take 81 mg by mouth daily.  . ASSURE COMFORT LANCETS 30G MISC Check blood sugar 3 times daily-DX-E11.22  . bisoprolol-hydrochlorothiazide (ZIAC) 5-6.25 MG  tablet TAKE ONE TABLET BY MOUTH DAILY FOR FOR BLOOD PRESSURE (Patient taking differently: TAKE 1/2 TABLET BY MOUTH DAILY FOR FOR BLOOD PRESSURE)  . Blood Glucose Calibration (ACCU-CHEK AVIVA) SOLN Check blood sugar 3 times daily-[id number]  . Blood Glucose Monitoring Suppl (ACCU-CHEK AVIVA) device Use  as instructed  . Cholecalciferol (VITAMIN D3) 2000 UNITS TABS Take 1 capsule by mouth 2 (two) times daily.  . dorzolamide-timolol (COSOPT) 22.3-6.8 MG/ML ophthalmic solution   . EASY COMFORT INSULIN SYRINGE 30G X 1/2" 0.5 ML MISC   . fenofibrate micronized (LOFIBRA) 134 MG capsule TAKE ONE CAPSULE BY MOUTH  DAILY FOR BLOOD FATS  . insulin lispro (HUMALOG) 100 UNIT/ML injection Inject 0.05-0.1 mLs (5-10 Units total) 3 (three) times daily with meals into the skin. Patient takes Rx on a sliding scale basis  . LANTUS SOLOSTAR 100 UNIT/ML Solostar Pen INJECT 30 UNITS INTO THE SKIN AT BEDTIME OR AS DIRECTED TO CONTROL BLOOD SUGARS  . latanoprost (XALATAN) 0.005 % ophthalmic solution   . LOTEMAX 0.5 % GEL   . Magnesium 500 MG CAPS Take 500 mg by mouth 2 (two) times daily.  . metFORMIN (GLUCOPHAGE) 1000 MG tablet TAKE ONE TABLET BY MOUTH TWICE A DAY AFTER MEALS FOR DIABETES  . Multiple Vitamins-Minerals (HAIR/SKIN/NAILS) TABS Take by mouth 2 (two) times daily.  . pravastatin (PRAVACHOL) 40 MG tablet TAKE ONE TABLET BY MOUTH AT BEDTIME FOR CHOLESTEROL  . rOPINIRole (REQUIP) 3 MG tablet TAKE 1 TABLET BY MOUTH TWICE DAILY AND TAKE TWO TABLETS AT BEDTIME FOR RESTLESS LEGS  . triamcinolone ointment (KENALOG) 0.1 % Apply 1 application topically 2 (two) times daily.  Marland Kitchen glipiZIDE (GLUCOTROL) 5 MG tablet Take 1/2 to 1 tablet 3 x / day before meals (Patient not taking: Reported on 09/08/2017)  . sulfamethoxazole-trimethoprim (BACTRIM DS,SEPTRA DS) 800-160 MG tablet Take 1 tablet 2 x / day with food for UTI. (Patient not taking: Reported on 09/08/2017)   No current facility-administered medications on file prior to visit.      Allergies:  Allergies  Allergen Reactions  . Morphine And Related Other (See Comments)    fuzzy feeling     Medical History:  Past Medical History:  Diagnosis Date  . Arthritis   . Chronic back pain   . Diabetic neuropathy ([place])   . Hypercholesteremia   . Hypertension   . Liver  lesion   . Neuropathy   . Type II or unspecified type diabetes mellitus without mention of complication, not stated as uncontrolled   . Vitamin D deficiency   . Wears glasses    Family history- Reviewed and unchanged Social history- Reviewed and unchanged   Review of Systems:  Review of Systems  Constitutional: Negative for malaise/fatigue and weight loss.  HENT: Negative for hearing loss and tinnitus.   Eyes: Negative for blurred vision and double vision.  Respiratory: Negative for cough, shortness of breath and wheezing.   Cardiovascular: Negative for chest pain, palpitations, orthopnea, claudication and leg swelling.  Gastrointestinal: Negative for abdominal pain, blood in stool, constipation, diarrhea, heartburn, melena, nausea and vomiting.  Genitourinary: Negative.   Musculoskeletal: Negative for joint pain and myalgias.  Skin: Negative for rash.  Neurological: Negative for dizziness, tingling, sensory change, weakness and headaches.  Endo/Heme/Allergies: Negative for polydipsia.  Psychiatric/Behavioral: Negative.   All other systems reviewed and are negative.   Physical Exam: BP (!) 110/50   Pulse (!) 57   Temp (!) 97.5 F (36.4 C)   Ht 5' 3.5" (1.613 m)  Wt 117 lb (53.1 kg)   SpO2 97%   BMI 20.40 kg/m  Wt Readings from Last 3 Encounters:  09/08/17 117 lb (53.1 kg)  08/03/17 115 lb (52.2 kg)  07/05/17 118 lb (53.5 kg)   General Appearance: Well nourished, in no apparent distress. Eyes: PERRLA, EOMs, conjunctiva no swelling or erythema Sinuses: No Frontal/maxillary tenderness ENT/Mouth: Ext aud canals clear, TMs without erythema, bulging. No erythema, swelling, or exudate on post pharynx.  Tonsils not swollen or erythematous. Hearing normal.  Neck: Supple, thyroid normal.  Respiratory: Respiratory effort normal, BS equal bilaterally without rales, rhonchi, wheezing or stridor.  Cardio: RRR with no MRGs. Brisk peripheral pulses without edema.  Abdomen: Soft, +  BS.  Non tender, no guarding, rebound, hernias, masses. Lymphatics: Non tender without lymphadenopathy.  Musculoskeletal: Full ROM, 5/5 strength, Normal gait Skin: Warm, dry without rashes, lesions, ecchymosis.  Neuro: Cranial nerves intact. No cerebellar symptoms.  Psych: Awake and oriented X 3, normal affect, Insight and Judgment appropriate. Memory/comprehension somewhat poor.   Izora Ribas, NP 10:22 AM [facility]

## 2017-09-08 ENCOUNTER — Ambulatory Visit (INDEPENDENT_AMBULATORY_CARE_PROVIDER_SITE_OTHER): Payer: Medicare Other | Admitting: Adult Health

## 2017-09-08 ENCOUNTER — Encounter: Payer: Self-pay | Admitting: Adult Health

## 2017-09-08 VITALS — BP 134/62 | HR 57 | Temp 97.5°F | Ht 63.5 in | Wt 117.0 lb

## 2017-09-08 DIAGNOSIS — E118 Type 2 diabetes mellitus with unspecified complications: Secondary | ICD-10-CM | POA: Diagnosis not present

## 2017-09-08 DIAGNOSIS — Z79899 Other long term (current) drug therapy: Secondary | ICD-10-CM | POA: Diagnosis not present

## 2017-09-08 DIAGNOSIS — E1122 Type 2 diabetes mellitus with diabetic chronic kidney disease: Secondary | ICD-10-CM | POA: Diagnosis not present

## 2017-09-08 DIAGNOSIS — N183 Chronic kidney disease, stage 3 unspecified: Secondary | ICD-10-CM

## 2017-09-08 DIAGNOSIS — E782 Mixed hyperlipidemia: Secondary | ICD-10-CM

## 2017-09-08 DIAGNOSIS — E559 Vitamin D deficiency, unspecified: Secondary | ICD-10-CM

## 2017-09-08 DIAGNOSIS — I10 Essential (primary) hypertension: Secondary | ICD-10-CM | POA: Diagnosis not present

## 2017-09-08 DIAGNOSIS — Z794 Long term (current) use of insulin: Secondary | ICD-10-CM | POA: Diagnosis not present

## 2017-09-08 DIAGNOSIS — Z6820 Body mass index (BMI) 20.0-20.9, adult: Secondary | ICD-10-CM

## 2017-09-08 NOTE — Patient Instructions (Addendum)
Take 1/2 tab of glipizide for blood sugar 130-180, take full tab for blood sugar above 180  We may cut back on your insulin depending on your A1C result today  Please be careful with glipizide (generic). This medication forces your blood sugar down no matter what it is starting at. Only take 1/2 of the medication if your sugar is above 130 and you can take a whole pill if your sugar is above 180 in the morning. If at any time you start to have low blood sugars in the morning or during the day please stop this medication. Please never take this medication if you are sick or can not eat. A low blood sugar is much more dangerous than a high blood sugar. Your brain needs two things, sugar and oxygen.    Hypoglycemia Hypoglycemia occurs when the level of sugar (glucose) in the blood is too low. Glucose is a type of sugar that provides the body's main source of energy. Certain hormones (insulin and glucagon) control the level of glucose in the blood. Insulin lowers blood glucose, and glucagon increases blood glucose. Hypoglycemia can result from having too much insulin in the bloodstream, or from not eating enough food that contains glucose. Hypoglycemia can happen in people who do or do not have diabetes. It can develop quickly, and it can be a medical emergency. What are the causes? Hypoglycemia occurs most often in people who have diabetes. If you have diabetes, hypoglycemia may be caused by:  Diabetes medicine.  Not eating enough, or not eating often enough.  Increased physical activity.  Drinking alcohol, especially when you have not eaten recently.  If you do not have diabetes, hypoglycemia may be caused by:  A tumor in the pancreas. The pancreas is the organ that makes insulin.  Not eating enough, or not eating for long periods at a time (fasting).  Severe infection or illness that affects the liver, heart, or kidneys.  Certain medicines.  You may also have reactive hypoglycemia. This  condition causes hypoglycemia within 4 hours of eating a meal. This may occur after having stomach surgery. Sometimes, the cause of reactive hypoglycemia is not known. What increases the risk? Hypoglycemia is more likely to develop in:  People who have diabetes and take medicines to lower blood glucose.  People who abuse alcohol.  People who have a severe illness.  What are the signs or symptoms? Hypoglycemia may not cause any symptoms. If you have symptoms, they may include:  Hunger.  Anxiety.  Sweating and feeling clammy.  Confusion.  Dizziness or feeling light-headed.  Sleepiness.  Nausea.  Increased heart rate.  Headache.  Blurry vision.  Seizure.  Nightmares.  Tingling or numbness around the mouth, lips, or tongue.  A change in speech.  Decreased ability to concentrate.  A change in coordination.  Restless sleep.  Tremors or shakes.  Fainting.  Irritability.  How is this diagnosed? Hypoglycemia is diagnosed with a blood test to measure your blood glucose level. This blood test is done while you are having symptoms. Your health care provider may also do a physical exam and review your medical history. If you do not have diabetes, other tests may be done to find the cause of your hypoglycemia. How is this treated? This condition can often be treated by immediately eating or drinking something that contains glucose, such as:  3-4 sugar tablets (glucose pills).  Glucose gel, 15-gram tube.  Fruit juice, 4 oz (120 mL).  Regular soda (not diet  soda), 4 oz (120 mL).  Low-fat milk, 4 oz (120 mL).  Several pieces of hard candy.  Sugar or honey, 1 Tbsp.  Treating Hypoglycemia If You Have Diabetes  If you are alert and able to swallow safely, follow the 15:15 rule:  Take 15 grams of a rapid-acting carbohydrate. Rapid-acting options include: ? 1 tube of glucose gel. ? 3 glucose pills. ? 6-8 pieces of hard candy. ? 4 oz (120 mL) of fruit  juice. ? 4 oz (120 ml) of regular (not diet) soda.  Check your blood glucose 15 minutes after you take the carbohydrate.  If the repeat blood glucose level is still at or below 70 mg/dL (3.9 mmol/L), take 15 grams of a carbohydrate again.  If your blood glucose level does not increase above 70 mg/dL (3.9 mmol/L) after 3 tries, seek emergency medical care.  After your blood glucose level returns to normal, eat a meal or a snack within 1 hour.  Treating Severe Hypoglycemia Severe hypoglycemia is when your blood glucose level is at or below 54 mg/dL (3 mmol/L). Severe hypoglycemia is an emergency. Do not wait to see if the symptoms will go away. Get medical help right away. Call your local emergency services (911 in the [place] Do not drive yourself to the hospital. If you have severe hypoglycemia and you cannot eat or drink, you may need an injection of glucagon. A family member or close friend should learn how to check your blood glucose and how to give you a glucagon injection. Ask your health care provider if you need to have an emergency glucagon injection kit available. Severe hypoglycemia may need to be treated in a hospital. The treatment may include getting glucose through an IV tube. You may also need treatment for the cause of your hypoglycemia. Follow these instructions at home: General instructions  Avoid any diets that cause you to not eat enough food. Talk with your health care provider before you start any new diet.  Take over-the-counter and prescription medicines only as told by your health care provider.  Limit alcohol intake to no more than 1 drink per day for nonpregnant women and 2 drinks per day for men. One drink equals 12 oz of beer, 5 oz of wine, or 1 oz of hard liquor.  Keep all follow-up visits as told by your health care provider. This is important. If You Have Diabetes:   Make sure you know the symptoms of hypoglycemia.  Always have a rapid-acting carbohydrate  snack with you to treat low blood sugar.  Follow your diabetes management plan, as told by your health care provider. Make sure you: ? Take your medicines as directed. ? Follow your exercise plan. ? Follow your meal plan. Eat on time, and do not skip meals. ? Check your blood glucose as often as directed. Make sure to check your blood glucose before and after exercise. If you exercise longer or in a different way than usual, check your blood glucose more often. ? Follow your sick day plan whenever you cannot eat or drink normally. Make this plan in advance with your health care provider.  Share your diabetes management plan with people in your workplace, school, and household.  Check your urine for ketones when you are ill and as told by your health care provider.  Carry a medical alert card or wear medical alert jewelry. If You Have Reactive Hypoglycemia or Low Blood Sugar From Other Causes:  Monitor your blood glucose as told by  your health care provider.  Follow instructions from your health care provider about eating or drinking restrictions. Contact a health care provider if:  You have problems keeping your blood glucose in your target range.  You have frequent episodes of hypoglycemia. Get help right away if:  You continue to have hypoglycemia symptoms after eating or drinking something containing glucose.  Your blood glucose is at or below 54 mg/dL (3 mmol/L).  You have a seizure.  You faint. These symptoms may represent a serious problem that is an emergency. Do not wait to see if the symptoms will go away. Get medical help right away. Call your local emergency services (911 in the [place] Do not drive yourself to the hospital. This information is not intended to replace advice given to you by your health care provider. Make sure you discuss any questions you have with your health care provider. Document Released: 04/11/2005 Document Revised: 09/23/2015 Document Reviewed:  05/15/2015 Elsevier Interactive Patient Education  [employer].

## 2017-09-09 LAB — COMPLETE METABOLIC PANEL WITH GFR
AG Ratio: 1.5 (calc) (ref 1.0–2.5)
ALT: 10 U/L (ref 6–29)
AST: 16 U/L (ref 10–35)
Albumin: 4.1 g/dL (ref 3.6–5.1)
Alkaline phosphatase (APISO): 71 U/L (ref 33–130)
BUN/Creatinine Ratio: 17 (calc) (ref 6–22)
BUN: 24 mg/dL (ref 7–25)
CO2: 31 mmol/L (ref 20–32)
Calcium: 10.2 mg/dL (ref 8.6–10.4)
Chloride: 99 mmol/L (ref 98–110)
Creat: 1.42 mg/dL — ABNORMAL HIGH (ref 0.60–0.88)
GFR, Est African American: 39 mL/min/{1.73_m2} — ABNORMAL LOW (ref >=60)
GFR, Est Non African American: 34 mL/min/{1.73_m2} — ABNORMAL LOW (ref >=60)
Globulin: 2.8 g/dL (calc) (ref 1.9–3.7)
Glucose, Bld: 302 mg/dL — ABNORMAL HIGH (ref 65–99)
Potassium: 5.2 mmol/L (ref 3.5–5.3)
Sodium: 138 mmol/L (ref 135–146)
Total Bilirubin: 0.4 mg/dL (ref 0.2–1.2)
Total Protein: 6.9 g/dL (ref 6.1–8.1)

## 2017-09-09 LAB — LIPID PANEL
Cholesterol: 149 mg/dL (ref <200)
HDL: 45 mg/dL — ABNORMAL LOW (ref >50)
LDL Cholesterol (Calc): 75 mg/dL (calc)
Non-HDL Cholesterol (Calc): 104 mg/dL (calc) (ref <130)
Total CHOL/HDL Ratio: 3.3 (calc) (ref <5.0)
Triglycerides: 203 mg/dL — ABNORMAL HIGH (ref <150)

## 2017-09-09 LAB — CBC WITH DIFFERENTIAL/PLATELET
Basophils Absolute: 68 cells/uL (ref 0–200)
Basophils Relative: 0.7 %
Eosinophils Absolute: 330 cells/uL (ref 15–500)
Eosinophils Relative: 3.4 %
HCT: 38.3 % (ref 35.0–45.0)
Hemoglobin: 13.1 g/dL (ref 11.7–15.5)
Lymphs Abs: 3259 cells/uL (ref 850–3900)
MCH: 31.6 pg (ref 27.0–33.0)
MCHC: 34.2 g/dL (ref 32.0–36.0)
MCV: 92.5 fL (ref 80.0–100.0)
MPV: 12.4 fL (ref 7.5–12.5)
Monocytes Relative: 9.4 %
Neutro Abs: 5131 cells/uL (ref 1500–7800)
Neutrophils Relative %: 52.9 %
Platelets: 292 10*3/uL (ref 140–400)
RBC: 4.14 10*6/uL (ref 3.80–5.10)
RDW: 11.4 % (ref 11.0–15.0)
Total Lymphocyte: 33.6 %
WBC mixed population: 912 cells/uL (ref 200–950)
WBC: 9.7 10*3/uL (ref 3.8–10.8)

## 2017-09-09 LAB — HEMOGLOBIN A1C
Hgb A1c MFr Bld: 12.1 % of total Hgb — ABNORMAL HIGH (ref <5.7)
Mean Plasma Glucose: 301 (calc)
eAG (mmol/L): 16.6 (calc)

## 2017-09-09 LAB — TSH: TSH: 2.03 mIU/L (ref 0.40–4.50)

## 2017-09-13 ENCOUNTER — Ambulatory Visit: Payer: Self-pay | Admitting: Adult Health

## 2017-10-03 ENCOUNTER — Other Ambulatory Visit: Payer: Self-pay | Admitting: Adult Health

## 2017-10-04 ENCOUNTER — Other Ambulatory Visit: Payer: Self-pay

## 2017-10-04 MED ORDER — GLIPIZIDE 5 MG PO TABS: ORAL_TABLET | ORAL | 1 refills | Status: DC

## 2017-10-10 DIAGNOSIS — H43811 Vitreous degeneration, right eye: Secondary | ICD-10-CM | POA: Diagnosis not present

## 2017-10-10 DIAGNOSIS — H35352 Cystoid macular degeneration, left eye: Secondary | ICD-10-CM | POA: Diagnosis not present

## 2017-10-10 DIAGNOSIS — H35351 Cystoid macular degeneration, right eye: Secondary | ICD-10-CM | POA: Diagnosis not present

## 2017-10-10 DIAGNOSIS — H353211 Exudative age-related macular degeneration, right eye, with active choroidal neovascularization: Secondary | ICD-10-CM | POA: Diagnosis not present

## 2017-10-11 ENCOUNTER — Other Ambulatory Visit: Payer: Self-pay

## 2017-10-11 MED ORDER — AZITHROMYCIN 250 MG PO TABS: ORAL_TABLET | ORAL | 0 refills | Status: AC

## 2017-10-21 ENCOUNTER — Other Ambulatory Visit: Payer: Self-pay | Admitting: Internal Medicine

## 2017-11-02 DIAGNOSIS — M47816 Spondylosis without myelopathy or radiculopathy, lumbar region: Secondary | ICD-10-CM | POA: Diagnosis not present

## 2017-11-15 DIAGNOSIS — H35351 Cystoid macular degeneration, right eye: Secondary | ICD-10-CM | POA: Diagnosis not present

## 2017-11-15 DIAGNOSIS — H43811 Vitreous degeneration, right eye: Secondary | ICD-10-CM | POA: Diagnosis not present

## 2017-11-15 DIAGNOSIS — H353211 Exudative age-related macular degeneration, right eye, with active choroidal neovascularization: Secondary | ICD-10-CM | POA: Diagnosis not present

## 2017-11-27 ENCOUNTER — Other Ambulatory Visit: Payer: Self-pay | Admitting: *Deleted

## 2017-11-27 DIAGNOSIS — E1122 Type 2 diabetes mellitus with diabetic chronic kidney disease: Secondary | ICD-10-CM

## 2017-11-27 DIAGNOSIS — N183 Chronic kidney disease, stage 3 unspecified: Secondary | ICD-10-CM

## 2017-11-27 DIAGNOSIS — E11319 Type 2 diabetes mellitus with unspecified diabetic retinopathy without macular edema: Secondary | ICD-10-CM

## 2017-11-27 DIAGNOSIS — E118 Type 2 diabetes mellitus with unspecified complications: Secondary | ICD-10-CM

## 2017-11-27 DIAGNOSIS — Z794 Long term (current) use of insulin: Secondary | ICD-10-CM

## 2017-11-27 DIAGNOSIS — E1140 Type 2 diabetes mellitus with diabetic neuropathy, unspecified: Secondary | ICD-10-CM

## 2017-11-27 MED ORDER — ACCU-CHEK AVIVA DEVI: 0 refills | Status: AC

## 2017-12-20 ENCOUNTER — Encounter: Payer: Self-pay | Admitting: Internal Medicine

## 2017-12-20 ENCOUNTER — Ambulatory Visit (INDEPENDENT_AMBULATORY_CARE_PROVIDER_SITE_OTHER): Payer: Medicare Other | Admitting: Physician Assistant

## 2017-12-20 VITALS — BP 132/68 | HR 58 | Temp 98.4°F | Resp 12 | Ht 63.5 in | Wt 117.4 lb

## 2017-12-20 DIAGNOSIS — E782 Mixed hyperlipidemia: Secondary | ICD-10-CM

## 2017-12-20 DIAGNOSIS — E0590 Thyrotoxicosis, unspecified without thyrotoxic crisis or storm: Secondary | ICD-10-CM

## 2017-12-20 DIAGNOSIS — E559 Vitamin D deficiency, unspecified: Secondary | ICD-10-CM | POA: Diagnosis not present

## 2017-12-20 DIAGNOSIS — N183 Chronic kidney disease, stage 3 unspecified: Secondary | ICD-10-CM

## 2017-12-20 DIAGNOSIS — Z0001 Encounter for general adult medical examination with abnormal findings: Secondary | ICD-10-CM | POA: Diagnosis not present

## 2017-12-20 DIAGNOSIS — R6889 Other general symptoms and signs: Secondary | ICD-10-CM | POA: Diagnosis not present

## 2017-12-20 DIAGNOSIS — Z794 Long term (current) use of insulin: Secondary | ICD-10-CM

## 2017-12-20 DIAGNOSIS — E1140 Type 2 diabetes mellitus with diabetic neuropathy, unspecified: Secondary | ICD-10-CM

## 2017-12-20 DIAGNOSIS — Z0000 Encounter for general adult medical examination without abnormal findings: Secondary | ICD-10-CM

## 2017-12-20 DIAGNOSIS — E11319 Type 2 diabetes mellitus with unspecified diabetic retinopathy without macular edema: Secondary | ICD-10-CM

## 2017-12-20 DIAGNOSIS — E1122 Type 2 diabetes mellitus with diabetic chronic kidney disease: Secondary | ICD-10-CM | POA: Diagnosis not present

## 2017-12-20 DIAGNOSIS — Z79899 Other long term (current) drug therapy: Secondary | ICD-10-CM

## 2017-12-20 DIAGNOSIS — E118 Type 2 diabetes mellitus with unspecified complications: Secondary | ICD-10-CM

## 2017-12-20 DIAGNOSIS — I10 Essential (primary) hypertension: Secondary | ICD-10-CM

## 2017-12-20 DIAGNOSIS — I700 Atherosclerosis of aorta: Secondary | ICD-10-CM

## 2017-12-20 DIAGNOSIS — G894 Chronic pain syndrome: Secondary | ICD-10-CM

## 2017-12-20 DIAGNOSIS — N390 Urinary tract infection, site not specified: Secondary | ICD-10-CM | POA: Diagnosis not present

## 2017-12-20 NOTE — Patient Instructions (Addendum)
FOLLOW UP WITH DERMATOLOGY  STOP USING NEOSPORIN- CAN USE VASELINE  STOP THE FENOFIBRATE AND STOP THE GLIPIZIDE  KEEP TRACK OF YOUR SUGARS AND COME IN 1-2 WEEKS FOR CHRONIC CARE MANAGEMENT VISIT WITH ALL YOUR SUGARS   increase by 2 units every 3 days until the waking up sugars are under 150. Then continue the same dose. INCREASE YOUR LANTUS NIGHT TIME INSULIN BY 2 UNITS- GO TO 16 UNITS AND KEEP TRACK OF YOUR SUGARS- BRING LOG OF ALL SUGARS NEXT OV  KEEP TRACK OF YOU MEAL TIME SUGARS AS WELL AND BRING THOSE IN   If blood sugar is under 90 for 2 days in a row, reduce the dose by 2 units. Note that this insulin does not control the rise of blood sugar with meals    Please remember only take the insulin WITH food, if you are sick or unable to eat DO NOT take your insulin. Also a low blood sugar is much more dangerous than a high blood sugar. Your brain needs 2 things, oxygen and sugar, so lets make sure it gets both. If at any time you have a question or concern, call the office or message [place] in [place].    Your A1C is a measure of your sugar over the past 3 months and is not affected by what you have eaten over the past few days. Diabetes increases your chances of stroke and heart attack over 300 % and is the leading cause of blindness and kidney failure in the [place]. Please make sure you decrease bad carbs like white bread, white rice, potatoes, corn, soft drinks, pasta, cereals, refined sugars, sweet tea, dried fruits, and fruit juice. Good carbs are okay to eat in moderation like sweet potatoes, brown rice, whole grain pasta/bread, most fruit (except dried fruit) and you can eat as many veggies as you want.   Greater than 6.5 is considered diabetic. Between 6.4 and 5.7 is prediabetic If your A1C is less than 5.7 you are NOT diabetic.  Targets for Glucose Readings: Time of Check Target for patients WITHOUT Diabetes Target for DIABETICS  Before Meals Less than 100  less than 150  Two  hours after meals Less than 200  Less than 250    If your morning sugar is always below 120 but your A1C is still elevated then the issue is with your sugar spiking after meals. Try to take your blood sugar approximately 2 hours after eating, this number should be less than 200. If it is not, think about the foods that you ate and better choices you can make.   ADD BACK ON YOUR MULTIVITAMIN

## 2017-12-20 NOTE — Progress Notes (Signed)
Patient ID: [name], female   DOB: 1930/04/02, 84 y.o.   MRN: [id number]  MEDICARE ANNUAL WELLNESS VISIT AND OV  Assessment:    Essential hypertension - continue medications, DASH diet, exercise and monitor at home. Call if greater than 130/80. -     CBC with Differential/Platelet -     BASIC METABOLIC PANEL WITH GFR -     Hepatic function panel -     TSH  Uncontrolled secondary diabetes mellitus with stage 3 CKD (GFR 30-59) (HCC) Discussed general issues about diabetes pathophysiology and management., Educational material distributed., Suggested low cholesterol diet., Encouraged aerobic exercise., Discussed foot care., Reminded to get yearly retinal exam. -     Hemoglobin A1c WILL BRING IN SUGARS X 1-2 WEEKS, WILL CHECK 3 TIMES A DAY - INCREASE LANTUS TO 16 UNITS STOP GLIPIZIDE AND WILL DO HUMOLOG TID  Controlled type 2 diabetes with neuropathy ([place]) Discussed general issues about diabetes pathophysiology and management., Educational material distributed., Suggested low cholesterol diet., Encouraged aerobic exercise., Discussed foot care., Reminded to get yearly retinal exam. -     Hemoglobin A1c  Diabetic retinopathy associated with type 2 diabetes mellitus, macular edema presence unspecified, unspecified laterality, unspecified retinopathy severity ([place]) Discussed general issues about diabetes pathophysiology and management., Educational material distributed., Suggested low cholesterol diet., Encouraged aerobic exercise., Discussed foot care., Reminded to get yearly retinal exam. -     Hemoglobin A1c  Mixed hyperlipidemia -continue medications, check lipids, decrease fatty foods, increase activity.  -     Lipid panel  Chronic pain syndrome monitor  Medication management -     Magnesium  Vitamin D deficiency  Encounter for Medicare annual wellness exam  Atherosclerosis of aorta ([place]) Control blood pressure, cholesterol, glucose, increase exercise.  -     Lipid  panel -     Hemoglobin A1c  Urinary tract infection without hematuria, site unspecified -     Urinalysis, Routine w reflex microscopic -     Urine Culture  Medication management -     CBC with Differential/Platelet -     COMPLETE METABOLIC PANEL WITH GFR -     Magnesium -     Lipid panel -     TSH -     Hemoglobin A1c -     VITAMIN D 25 Hydroxy (Vit-D Deficiency, Fractures) -     Urinalysis, Routine w reflex microscopic -     Urine Culture     Plan:   During the course of the visit the patient was educated and counseled about appropriate screening and preventive services including:    Pneumococcal vaccine   Influenza vaccine  Td vaccine  Screening electrocardiogram  Bone densitometry screening  Colorectal cancer screening  Diabetes screening  Glaucoma screening  Nutrition counseling   Advanced directives: requested   Subjective:   [name]  presents for Medicare Annual Wellness Visit and follow up for HTN, chol, DM with CKD/retinopathy/neuropathy.   She has had sores on her legs from a shoe that she were but they are not healing. she She has a garden, helps gets some of the beans/garden, states she is not able to do as much as she use to, will rest often.  She has had some loose stools x 1 weeks, some lower AB pain, feels stomach rumbling, this AM loose stool, has been eating a lot of fresh fruit. No fever, chills.  Patient is treated for HTN since 2000 & BP has been controlled at home. BP:  132/68  Patient had a negative Cardiolite in 2009. Patient has had no complaints of any cardiac type chest pain, palpitations, dyspnea/orthopnea/PND, dizziness, claudication, or dependent edema.  Hyperlipidemia is controlled with diet & meds. Patient denies myalgias or other med SE's. Last Lipids were at goal  Lab Results  Component Value Date   CHOL 149 09/08/2017   HDL 45 (L) 09/08/2017   LDLCALC 75 09/08/2017   TRIG 203 (H) 09/08/2017   CHOLHDL 3.3 09/08/2017    Also, the patient has history of T2_IDDM x 1996 and has been on insulin since 2000 and she has complications w/ CKD, retinopathy and Neuropathy, she is on lantus 14 units at night with sugars 230's, she only takes humolog with morning sugar, does not take it with lunch or dinner. She has had no symptoms of reactive hypoglycemia, diabetic polys or visual blurring. She does report tingling and occasional painful paresthesias of the feet.  Lab Results  Component Value Date   HGBA1C 12.1 (H) 09/08/2017   Lab Results  Component Value Date   GFRNONAA 34 (L) 09/08/2017   Further, the patient also has history of Vitamin D Deficiency, on supplements.  Lab Results  Component Value Date   VD25OH 81 05/03/2016   BMI is Body mass index is 20.47 kg/m., she is working on diet and exercise. Wt Readings from Last 3 Encounters:  12/20/17 117 lb 6.4 oz (53.3 kg)  09/08/17 117 lb (53.1 kg)  08/03/17 115 lb (52.2 kg)    Names of Other Physician/Practitioners you currently use: 1. [facility] here for primary care 2. Dr[facility], eye doctor, last visit in Dec 04 2015, goes back in aug 3. Dr Sissy Hoff, DDS, dentist, last visit July 2018  Patient Care Team: [name], MD as PCP - General (Internal Medicine) Warden Fillers, MD as Consulting Physician (Optometry) Zadie Rhine Clent Demark, MD as Consulting Physician (Ophthalmology) Glenna Fellows, MD as Attending Physician (Neurosurgery) Melrose Nakayama, MD as Consulting Physician (Orthopedic Surgery) Milus Banister, MD as Attending Physician (Gastroenterology)   Current Outpatient Medications on File Prior to Visit  Medication Sig  . ACCU-CHEK AVIVA PLUS test strip Test 3 times a day  . aspirin 81 MG tablet Take 81 mg by mouth daily.  . ASSURE COMFORT LANCETS 30G MISC Check blood sugar 3 times daily-DX-E11.22  . bisoprolol-hydrochlorothiazide (ZIAC) 5-6.25 MG tablet TAKE ONE TABLET BY MOUTH DAILY FOR  FOR BLOOD PRESSURE (Patient taking differently: TAKE 1/2 TABLET BY MOUTH DAILY FOR FOR BLOOD PRESSURE)  . Blood Glucose Calibration (ACCU-CHEK AVIVA) SOLN Check blood sugar 3 times daily-DX-E11.22  . Blood Glucose Monitoring Suppl (ACCU-CHEK AVIVA) device Check blood sugar 3 times a day.  DX-E11.22  . Cholecalciferol (VITAMIN D3) 2000 UNITS TABS Take 1 capsule by mouth 2 (two) times daily.  . dorzolamide-timolol (COSOPT) 22.3-6.8 MG/ML ophthalmic solution   . EASY COMFORT INSULIN SYRINGE 30G X 1/2" 0.5 ML MISC   . fenofibrate micronized (LOFIBRA) 134 MG capsule TAKE ONE CAPSULE BY MOUTH  DAILY FOR BLOOD FATS  . glipiZIDE (GLUCOTROL) 5 MG tablet Take 1/2 to 1 tablet 3 x / day before meals  . insulin lispro (HUMALOG) 100 UNIT/ML injection Inject 0.05-0.1 mLs (5-10 Units total) 3 (three) times daily with meals into the skin. Patient takes Rx on a sliding scale basis  . LANTUS SOLOSTAR 100 UNIT/ML Solostar Pen INJECT 30 UNITS INTO THE SKIN AT BEDTIME OR AS DIRECTED TO CONTROL BLOOD SUGARS  . latanoprost (XALATAN)  0.005 % ophthalmic solution   . LOTEMAX 0.5 % GEL   . Magnesium 500 MG CAPS Take 500 mg by mouth 2 (two) times daily.  . metFORMIN (GLUCOPHAGE) 1000 MG tablet TAKE ONE TABLET BY MOUTH TWICE A DAY AFTER MEALS FOR DIABETES  . Multiple Vitamins-Minerals (HAIR/SKIN/NAILS) TABS Take by mouth 2 (two) times daily.  . pravastatin (PRAVACHOL) 40 MG tablet TAKE ONE TABLET BY MOUTH AT BEDTIME FOR CHOLESTEROL  . rOPINIRole (REQUIP) 3 MG tablet TAKE 1 TABLET BY MOUTH TWICE DAILY AND TAKE TWO TABLETS AT BEDTIME FOR RESTLESS LEGS  . triamcinolone ointment (KENALOG) 0.1 % Apply 1 application topically 2 (two) times daily.   No current facility-administered medications on file prior to visit.    Patient Active Problem List   Diagnosis Date Noted  . Type 2 diabetes mellitus with complication, with long-term current use of insulin ([place]) 02/09/2017  . Atherosclerosis of aorta ([place]) 11/06/2016  . Diabetic  retinopathy ([place]) 05/18/2015  . Encounter for Medicare annual wellness exam 05/18/2015  . Medication management 08/06/2013  . Neuropathy due to type 2 diabetes mellitus ([place]) 06/04/2013  . CKD stage 3 due to type 2 diabetes mellitus ([place])   . Mixed hyperlipidemia   . Vitamin D deficiency   . Hypertension 07/29/2012  . Chronic pain 07/29/2012    Screening Tests  Immunization History  Administered Date(s) Administered  . DT 01/04/2011  . Influenza Split 01/23/2013  . Influenza, High Dose Seasonal PF 02/27/2014, 03/11/2015, 12/21/2015, 01/30/2017  . Pneumococcal Conjugate-13 02/27/2014  . Pneumococcal Polysaccharide-23 01/06/2012   Preventative care: Last colonoscopy: 2006 will not get another due to age [facility] 06/2017 CXR 11/2015 DEXA 2007 Echo 2014 Lumbar 2018 MRA 2014  Tetanus 2012 Flu 2017 Pneumo 2013 Prevnar 13 2015 Shingles vaccine declines  Allergies Allergies  Allergen Reactions  . Morphine And Related Other (See Comments)    fuzzy feeling    SURGICAL HISTORY She  has a past surgical history that includes Abdominal hysterectomy; Cesarean section; Back surgery (1993); Appendectomy; Colonoscopy; Eye surgery; Trigger finger release (01/10/2012); and Breast excisional biopsy (Right, 1967). FAMILY HISTORY Her family history includes Diabetes in her sister; Liver disease in her sister. SOCIAL HISTORY She  reports that she has never smoked. She has never used smokeless tobacco. She reports that she does not drink alcohol or use drugs.  MEDICARE WELLNESS OBJECTIVES: Physical activity:   Cardiac risk factors:   Depression/mood screen:   Depression screen [facility] 2/9 12/20/2017  Decreased Interest 0  Down, Depressed, Hopeless 0  PHQ - 2 Score 0    ADLs:  In your present state of health, do you have any difficulty performing the following activities: 12/20/2017 12/20/2017  Hearing? N N  Vision? N N  Difficulty concentrating or making decisions? Y Y  Comment MILD  DEMENTIA mild dementia  Walking or climbing stairs? N N  Dressing or bathing? N N  Doing errands, shopping? Y N  Comment SHE IS NOT DRIVING AS MUCH -  Some recent data might be hidden     Cognitive Testing  Alert? Yes  Normal Appearance?Yes  Oriented to person? Yes  Place? Yes   Time? Yes  Recall of three objects?  1/3  Can perform simple calculations? Yes  Displays appropriate judgment?Yes  Can read the correct time from a watch face?Yes  EOL planning: Does Patient Have a Medical Advance Directive?: No   Objective:     Blood pressure 132/68, pulse (!) 58, temperature 98.4 F (36.9 C), resp. rate  12, height 5' 3.5" (1.613 m), weight 117 lb 6.4 oz (53.3 kg), SpO2 96 %.   General Appearance: Well nourished, alert, WD/WN, female and in no apparent distress. Eyes: PERRLA, EOMs, conjunctiva no swelling or erythema, normal fundi and vessels. Sinuses: No frontal/maxillary tenderness ENT/Mouth: EACs patent / TMs  nl. Nares clear without erythema, swelling, mucoid exudates. Oral hygiene is good. No erythema, swelling, or exudate. Tongue normal, non-obstructing. Tonsils not swollen or erythematous. Hearing normal.  Neck: Supple, thyroid normal. No bruits, nodes or JVD. Respiratory: Respiratory effort normal.  BS equal and clear bilateral without rales, rhonci, wheezing or stridor. Cardio: Heart sounds are normal with regular rate and rhythm and no murmurs, rubs or gallops. Peripheral pulses are normal and equal bilaterally without edema. No aortic or femoral bruits. Chest: symmetric with normal excursions and percussion. Abdomen: Flat, soft  with nl bowel sounds. Nontender, no guarding, rebound, hernias, masses, or organomegaly.  Lymphatics: Non tender without lymphadenopathy.  Musculoskeletal: Full ROM all peripheral extremities, joint stability, 5/5 strength, and normal gait. Skin: right flank with dry skin, excoriations, fine papules, no vesicles.  Neuro: Cranial nerves intact. DTR's  UE  Nl/Equal and DTR's LE's absent.  Normal muscle tone, no cerebellar symptoms. Sensation decreased bilateral feet. Pysch: Alert and oriented X 3, normal affect, Insight and Judgment appropriate.    Medicare Attestation I have personally reviewed: The patient's medical and social history Their use of alcohol, tobacco or illicit drugs Their current medications and supplements The patient's functional ability including ADLs,fall risks, home safety risks, cognitive, and hearing and visual impairment Diet and physical activities Evidence for depression or mood disorders  The patient's weight, height, BMI, and visual acuity have been recorded in the chart.  I have made referrals, counseling, and provided education to the patient based on review of the above and I have provided the patient with a written personalized care plan for preventive services.  Over 40 minutes of exam, counseling, chart review was performed.  Vicie Mutters, PA-C   12/20/2017

## 2017-12-22 ENCOUNTER — Other Ambulatory Visit: Payer: Self-pay | Admitting: Internal Medicine

## 2017-12-22 DIAGNOSIS — N3000 Acute cystitis without hematuria: Secondary | ICD-10-CM

## 2017-12-22 LAB — LIPID PANEL
Cholesterol: 193 mg/dL (ref <200)
HDL: 42 mg/dL — ABNORMAL LOW (ref >50)
LDL Cholesterol (Calc): 119 mg/dL (calc) — ABNORMAL HIGH
Non-HDL Cholesterol (Calc): 151 mg/dL (calc) — ABNORMAL HIGH (ref <130)
Total CHOL/HDL Ratio: 4.6 (calc) (ref <5.0)
Triglycerides: 200 mg/dL — ABNORMAL HIGH (ref <150)

## 2017-12-22 LAB — HEMOGLOBIN A1C
Hgb A1c MFr Bld: 9.6 % of total Hgb — ABNORMAL HIGH (ref <5.7)
Mean Plasma Glucose: 229 (calc)
eAG (mmol/L): 12.7 (calc)

## 2017-12-22 LAB — CBC WITH DIFFERENTIAL/PLATELET
Basophils Absolute: 64 cells/uL (ref 0–200)
Basophils Relative: 0.6 %
Eosinophils Absolute: 514 cells/uL — ABNORMAL HIGH (ref 15–500)
Eosinophils Relative: 4.8 %
HCT: 36.7 % (ref 35.0–45.0)
Hemoglobin: 12.3 g/dL (ref 11.7–15.5)
Lymphs Abs: 2729 cells/uL (ref 850–3900)
MCH: 31.1 pg (ref 27.0–33.0)
MCHC: 33.5 g/dL (ref 32.0–36.0)
MCV: 92.7 fL (ref 80.0–100.0)
MPV: 13.2 fL — ABNORMAL HIGH (ref 7.5–12.5)
Monocytes Relative: 10.1 %
Neutro Abs: 6313 cells/uL (ref 1500–7800)
Neutrophils Relative %: 59 %
Platelets: 269 10*3/uL (ref 140–400)
RBC: 3.96 10*6/uL (ref 3.80–5.10)
RDW: 11.7 % (ref 11.0–15.0)
Total Lymphocyte: 25.5 %
WBC mixed population: 1081 cells/uL — ABNORMAL HIGH (ref 200–950)
WBC: 10.7 10*3/uL (ref 3.8–10.8)

## 2017-12-22 LAB — URINALYSIS, ROUTINE W REFLEX MICROSCOPIC
Bilirubin Urine: NEGATIVE
Hgb urine dipstick: NEGATIVE
Hyaline Cast: NONE SEEN /LPF
Ketones, ur: NEGATIVE
Nitrite: POSITIVE — AB
Protein, ur: NEGATIVE
Specific Gravity, Urine: 1.017 (ref 1.001–1.03)
Squamous Epithelial / LPF: NONE SEEN /HPF (ref <=5)
WBC, UA: >=60 /HPF — AB (ref 0–5)
pH: 7 (ref 5.0–8.0)

## 2017-12-22 LAB — COMPLETE METABOLIC PANEL WITH GFR
AG Ratio: 1.2 (calc) (ref 1.0–2.5)
ALT: 9 U/L (ref 6–29)
AST: 12 U/L (ref 10–35)
Albumin: 3.8 g/dL (ref 3.6–5.1)
Alkaline phosphatase (APISO): 78 U/L (ref 33–130)
BUN/Creatinine Ratio: 16 (calc) (ref 6–22)
BUN: 23 mg/dL (ref 7–25)
CO2: 32 mmol/L (ref 20–32)
Calcium: 10.1 mg/dL (ref 8.6–10.4)
Chloride: 96 mmol/L — ABNORMAL LOW (ref 98–110)
Creat: 1.48 mg/dL — ABNORMAL HIGH (ref 0.60–0.88)
GFR, Est African American: 37 mL/min/{1.73_m2} — ABNORMAL LOW (ref >=60)
GFR, Est Non African American: 32 mL/min/{1.73_m2} — ABNORMAL LOW (ref >=60)
Globulin: 3.2 g/dL (calc) (ref 1.9–3.7)
Glucose, Bld: 283 mg/dL — ABNORMAL HIGH (ref 65–99)
Potassium: 4.8 mmol/L (ref 3.5–5.3)
Sodium: 137 mmol/L (ref 135–146)
Total Bilirubin: 0.4 mg/dL (ref 0.2–1.2)
Total Protein: 7 g/dL (ref 6.1–8.1)

## 2017-12-22 LAB — URINE CULTURE
MICRO NUMBER:: 91031137
SPECIMEN QUALITY:: ADEQUATE

## 2017-12-22 LAB — TSH: TSH: 1.4 mIU/L (ref 0.40–4.50)

## 2017-12-22 LAB — MAGNESIUM: Magnesium: 2 mg/dL (ref 1.5–2.5)

## 2017-12-22 LAB — VITAMIN D 25 HYDROXY (VIT D DEFICIENCY, FRACTURES): Vit D, 25-Hydroxy: 54 ng/mL (ref 30–100)

## 2017-12-22 MED ORDER — CEFUROXIME AXETIL 500 MG PO TABS: ORAL_TABLET | ORAL | 0 refills | Status: DC

## 2017-12-22 MED ORDER — AMOXICILLIN-POT CLAVULANATE 250-125 MG PO TABS: ORAL_TABLET | ORAL | 0 refills | Status: DC

## 2017-12-28 DIAGNOSIS — H353211 Exudative age-related macular degeneration, right eye, with active choroidal neovascularization: Secondary | ICD-10-CM | POA: Diagnosis not present

## 2017-12-28 DIAGNOSIS — H353222 Exudative age-related macular degeneration, left eye, with inactive choroidal neovascularization: Secondary | ICD-10-CM | POA: Diagnosis not present

## 2017-12-28 DIAGNOSIS — H35351 Cystoid macular degeneration, right eye: Secondary | ICD-10-CM | POA: Diagnosis not present

## 2018-01-02 ENCOUNTER — Encounter: Payer: Self-pay | Admitting: Physician Assistant

## 2018-01-02 ENCOUNTER — Ambulatory Visit: Payer: Self-pay | Admitting: Adult Health

## 2018-01-02 ENCOUNTER — Ambulatory Visit (INDEPENDENT_AMBULATORY_CARE_PROVIDER_SITE_OTHER): Payer: Medicare Other | Admitting: Physician Assistant

## 2018-01-02 VITALS — BP 110/60 | HR 59 | Temp 97.4°F | Resp 12 | Ht 63.5 in | Wt 117.0 lb

## 2018-01-02 DIAGNOSIS — E782 Mixed hyperlipidemia: Secondary | ICD-10-CM

## 2018-01-02 DIAGNOSIS — N183 Chronic kidney disease, stage 3 unspecified: Secondary | ICD-10-CM

## 2018-01-02 DIAGNOSIS — E1122 Type 2 diabetes mellitus with diabetic chronic kidney disease: Secondary | ICD-10-CM | POA: Diagnosis not present

## 2018-01-02 DIAGNOSIS — I10 Essential (primary) hypertension: Secondary | ICD-10-CM | POA: Diagnosis not present

## 2018-01-02 DIAGNOSIS — E118 Type 2 diabetes mellitus with unspecified complications: Secondary | ICD-10-CM | POA: Diagnosis not present

## 2018-01-02 DIAGNOSIS — Z789 Other specified health status: Secondary | ICD-10-CM | POA: Diagnosis not present

## 2018-01-02 DIAGNOSIS — Z794 Long term (current) use of insulin: Secondary | ICD-10-CM

## 2018-01-02 DIAGNOSIS — G2581 Restless legs syndrome: Secondary | ICD-10-CM

## 2018-01-02 DIAGNOSIS — C44719 Basal cell carcinoma of skin of left lower limb, including hip: Secondary | ICD-10-CM

## 2018-01-02 DIAGNOSIS — E1140 Type 2 diabetes mellitus with diabetic neuropathy, unspecified: Secondary | ICD-10-CM

## 2018-01-02 MED ORDER — ROPINIROLE HCL 3 MG PO TABS: ORAL_TABLET | ORAL | 1 refills | Status: DC

## 2018-01-02 MED ORDER — ROSUVASTATIN CALCIUM 20 MG PO TABS: 20.0000 mg | ORAL_TABLET | Freq: Every day | ORAL | 2 refills | Status: DC

## 2018-01-02 NOTE — Patient Instructions (Addendum)
Chronic Care Management  This is your comprehensive care plan.  We will continue to monitor these chronic health issues and see if we are achieving our goals every 3 months. You can contact the office any time with the office at [phone] and get to a provider via the answering service at any time or you can use Mychart, if you are not signed up they can do so at the front office.  Here is some information that we discussed and went over below.   WE ARE STOPPING YOUR PRAVASTATIN AND STARTING YOU ON CRESTOR OR ROUVASTATIN, TAKE THIS AT NIGHT FOR YOUR CHOLESTEROL TO GET AT GOAL  START BACK ON THE REQUIP/ROPINOROLE, IF THIS DOES NOT HELP YOUR LEGS AT NIGHT WE WILL SEND A DIFFERENT MEDICATION AND STOP THE REQUIP   TRIAMCINOLONE YOU CAN USE VAGINALLY A SMALL AMOUNT ONCE AT NIGHT FOR ITCHING IT IS GOOD FOR RASHES OR ITCHING  THE LANTUS AFFECTS YOUR MORNING SUGAR IF YOUR MORNING SUGAR IS ABOVE 160 FOR 2 DAYS IN A ROW, INCREASE YOUR NIGHT TIME LANTUS BY 2 UNITS  IF YOUR MORNING SUGAR IS EVER BELOW 120 THEN YOU NEED TO CUT YOUR LANTUS AT NIGHT BY 2 UNITS  Can do the humolog WITH LARGE MEALS AT LEAST TWICE A DAY AFTER YOU CHECK YOUR SUGAR This insulin immediately brings down your sugar so only take with food Follow the sliding scale  Sugar Insulin  <70 NONE  70-130 None  131-180 2 units  181-240 3 unit  241-300 4 units  301-350 5 units  351-400 6 units and call the doctor   please keep your sugar log   These are the goals we discussed: Goals (1 Years of Data) as of 01/02/2018            12/20/17   09/08/17   06/05/17   02/27/17   11/07/16     Result Component   . HEMOGLOBIN A1C < 8.0   9.61   12.11   8.41   10.81   8.01    . LDL CALC < 70           76      1  Progress evaluated against: HEMOGLOBIN A1C < 7.0    Patient Care Team Patient Care Team: [name], MD as PCP - General (Internal Medicine) Warden Fillers, MD as Consulting Physician  (Optometry) Zadie Rhine, Clent Demark, MD as Consulting Physician (Ophthalmology) Glenna Fellows, MD as Attending Physician (Neurosurgery) Melrose Nakayama, MD as Consulting Physician (Orthopedic Surgery) Milus Banister, MD as Attending Physician (Gastroenterology)   This is a list of the screening recommended for you and due dates:  Health Maintenance  Topic Date Due  . Eye exam for diabetics  10/04/2017  . Flu Shot  11/23/2017  . Hemoglobin A1C  06/22/2018  . Complete foot exam   12/21/2018  . Tetanus Vaccine  01/03/2021  . DEXA scan (bone density measurement)  Completed  . Pneumonia vaccines  Completed    GOAL: Understand your diabetes (DM) diagnosis and how to manage it  Diabetes (DM) Self-Health Management Activities:   1. Understand  your diabetes (DM) diagnosis and symptoms  Diabetes (DM) Definition: A condition in which there is a high level of sugar (glucose) in the blood. This may be caused by several factors.   Some Diabetes signs/symptoms:  . Increased urination . Excessive thirst and hunger . Blurred vision and weakness . Tingling in hands or feet . Sores with possible infections that  heal slowly or do not heal   Things you can do: Marland Kitchen Control your blood sugar, cholesterol and blood pressure through healthy eating habits and an active lifestyle . Maintain an ideal body weight    2. Know which medicines are used to manage your diabetes (DM) and take them every day    Current Outpatient Medications (Endocrine & Metabolic):  .  insulin lispro (HUMALOG) 100 UNIT/ML injection, Inject 0.05-0.1 mLs (5-10 Units total) 3 (three) times daily with meals into the skin. Patient takes Rx on a sliding scale basis .  LANTUS SOLOSTAR 100 UNIT/ML Solostar Pen, INJECT 30 UNITS INTO THE SKIN AT BEDTIME OR AS DIRECTED TO CONTROL BLOOD SUGARS .  metFORMIN (GLUCOPHAGE) 1000 MG tablet, TAKE ONE TABLET BY MOUTH TWICE A DAY AFTER MEALS FOR DIABETES  Current Outpatient Medications (Cardiovascular):   .  bisoprolol-hydrochlorothiazide (ZIAC) 5-6.25 MG tablet, TAKE ONE TABLET BY MOUTH DAILY FOR FOR BLOOD PRESSURE (Patient taking differently: TAKE 1/2 TABLET BY MOUTH DAILY FOR FOR BLOOD PRESSURE) .  fenofibrate micronized (LOFIBRA) 134 MG capsule, TAKE ONE CAPSULE BY MOUTH  DAILY FOR BLOOD FATS .  pravastatin (PRAVACHOL) 40 MG tablet, TAKE ONE TABLET BY MOUTH AT BEDTIME FOR CHOLESTEROL   Current Outpatient Medications (Analgesics):  .  aspirin 81 MG tablet, Take 81 mg by mouth daily.   Current Outpatient Medications (Other):  Marland Kitchen  ACCU-CHEK AVIVA PLUS test strip, Test 3 times a day .  amoxicillin-clavulanate (AUGMENTIN) 250-125 MG tablet, Take 1 tablet 3 x /day with food  for Urinary  Tract   Infection .  ASSURE COMFORT LANCETS 30G MISC, Check blood sugar 3 times daily-DX-E11.22 .  Blood Glucose Calibration (ACCU-CHEK AVIVA) SOLN, Check blood sugar 3 times daily-DX-E11.22 .  Blood Glucose Monitoring Suppl (ACCU-CHEK AVIVA) device, Check blood sugar 3 times a day.  DX-E11.22 .  cefUROXime (CEFTIN) 500 MG tablet, Take 1 tablet 2 x /day with food for Urinary  Tract  Infection .  Cholecalciferol (VITAMIN D3) 2000 UNITS TABS, Take 1 capsule by mouth 2 (two) times daily. .  dorzolamide-timolol (COSOPT) 22.3-6.8 MG/ML ophthalmic solution,  .  EASY COMFORT INSULIN SYRINGE 30G X 1/2" 0.5 ML MISC,  .  latanoprost (XALATAN) 0.005 % ophthalmic solution,  .  LOTEMAX 0.5 % GEL,  .  Magnesium 500 MG CAPS, Take 500 mg by mouth 2 (two) times daily. .  Multiple Vitamins-Minerals (HAIR/SKIN/NAILS) TABS, Take by mouth 2 (two) times daily. Marland Kitchen  rOPINIRole (REQUIP) 3 MG tablet, TAKE 1 TABLET BY MOUTH TWICE DAILY AND TAKE TWO TABLETS AT BEDTIME FOR RESTLESS LEGS .  triamcinolone ointment (KENALOG) 0.1 %, Apply 1 application topically 2 (two) times daily.   3. Record and keep your provider appointments  Future Appointments  Date Time Provider [place]  01/02/2018 11:30 AM Vicie Mutters, PA-C  [facility] None  04/05/2018 10:45 AM Vicie Mutters, PA-C [facility] None  07/05/2018 10:00 AM [name], MD [facility] None

## 2018-01-02 NOTE — Progress Notes (Signed)
Comprehensive Care Management   Comprehensive care plan:  Essential hypertension - continue medications, DASH diet, exercise and monitor at home. Call if greater than 130/80.   CKD stage 3 due to type 2 diabetes mellitus ([place]) Will send to DM educator Keep recording follow up 1 month Continue lantus 20 units humolog may need to adjust sliding scale again -     Ambulatory referral to diabetic education  Type 2 diabetes mellitus with complication, with long-term current use of insulin ([place]) -     Ambulatory referral to diabetic education  Mixed hyperlipidemia -     rosuvastatin (CRESTOR) 20 MG tablet; Take 1 tablet (20 mg total) by mouth at bedtime. Stop pravastatin and start the crestor follow up 1 month recheck chol  Neuropathy due to type 2 diabetes mellitus (HCC) versus RLS -     rOPINIRole (REQUIP) 3 MG tablet; TAKE TWO TABLETS AT BEDTIME FOR RESTLESS LEGS -    If the requip does not help we will try nortriptyline  Basal cell carcinoma (BCC) of skin of left lower extremity including hip -     Ambulatory referral to Dermatology   Patient/caregiver was given comprehensive care plan  We will continue to monitor these goals every 3 months with an office visit and every month by a telephone call  Patient can contact the office any time with the phone number and get to a provider via the answering service or they can use Mychart.   Verbal permission was received from the patient to review comprehensive care management, they understand they have the right to stop CCM services at any time.   The patient is self managing medications at home.  Medications were reviewed with the patient today as well as adherence and potential interactions.   Community based clinical services were discussed  CLOSE FOLLOW UP OF 1 MONTH IN OFFICE  The patient does not need home health services at this time.  Continue diet and meds as discussed. Further disposition pending results of labs. Over 30  minutes of exam, counseling, chart review, and critical decision making was performed  Future Appointments  Date Time Provider [place]  01/02/2018 11:30 AM Vicie Mutters, PA-C [facility] None  04/05/2018 10:45 AM Vicie Mutters, PA-C [facility] None  07/05/2018 10:00 AM [name], MD [facility] None     HPI 84 y.o. female  presents for a Comprehensive care management for chronic problems.    Patient has chronic care issues that we are reviewing today in addition to other questions that are addressed.   She has a raised area on her right top of her foot with bleeding x several months. Will refer to dermatology.   She has been having leg burning and her feet won't stay still. She has not been taking her requip.   She is changing her RX to [facility] [place]  She does not drive.   Diabetes insulin dependent Patient does her own medications at home. She is on lantus and lispro for meal time insulin. Her glipizide was discontinued last visit.  She is checking her sugars and brought in a log today.   Lab Results  Component Value Date   HGBA1C 9.6 (H) 12/20/2017    Cholesterol  She is on pravastatin 40mg  at night and not at goal.   Lab Results  Component Value Date   CHOL 193 12/20/2017   HDL 42 (L) 12/20/2017   LDLCALC 119 (H) 12/20/2017   TRIG 200 (H) 12/20/2017   CHOLHDL 4.6  12/20/2017    Current Medications:   Current Outpatient Medications (Endocrine & Metabolic):  .  glipiZIDE (GLUCOTROL) 5 MG tablet, Take 1/2 to 1 tablet 3 x / day before meals .  insulin lispro (HUMALOG) 100 UNIT/ML injection, Inject 0.05-0.1 mLs (5-10 Units total) 3 (three) times daily with meals into the skin. Patient takes Rx on a sliding scale basis .  LANTUS SOLOSTAR 100 UNIT/ML Solostar Pen, INJECT 30 UNITS INTO THE SKIN AT BEDTIME OR AS DIRECTED TO CONTROL BLOOD SUGARS .  metFORMIN (GLUCOPHAGE) 1000 MG tablet, TAKE ONE TABLET BY MOUTH TWICE A DAY AFTER MEALS FOR  DIABETES  Current Outpatient Medications (Cardiovascular):  .  bisoprolol-hydrochlorothiazide (ZIAC) 5-6.25 MG tablet, TAKE ONE TABLET BY MOUTH DAILY FOR FOR BLOOD PRESSURE (Patient taking differently: TAKE 1/2 TABLET BY MOUTH DAILY FOR FOR BLOOD PRESSURE) .  fenofibrate micronized (LOFIBRA) 134 MG capsule, TAKE ONE CAPSULE BY MOUTH  DAILY FOR BLOOD FATS .  pravastatin (PRAVACHOL) 40 MG tablet, TAKE ONE TABLET BY MOUTH AT BEDTIME FOR CHOLESTEROL   Current Outpatient Medications (Analgesics):  .  aspirin 81 MG tablet, Take 81 mg by mouth daily.   Current Outpatient Medications (Other):  Marland Kitchen  ACCU-CHEK AVIVA PLUS test strip, Test 3 times a day .  amoxicillin-clavulanate (AUGMENTIN) 250-125 MG tablet, Take 1 tablet 3 x /day with food  for Urinary  Tract   Infection .  ASSURE COMFORT LANCETS 30G MISC, Check blood sugar 3 times daily-DX-E11.22 .  Blood Glucose Calibration (ACCU-CHEK AVIVA) SOLN, Check blood sugar 3 times daily-DX-E11.22 .  Blood Glucose Monitoring Suppl (ACCU-CHEK AVIVA) device, Check blood sugar 3 times a day.  DX-E11.22 .  cefUROXime (CEFTIN) 500 MG tablet, Take 1 tablet 2 x /day with food for Urinary  Tract  Infection .  Cholecalciferol (VITAMIN D3) 2000 UNITS TABS, Take 1 capsule by mouth 2 (two) times daily. .  dorzolamide-timolol (COSOPT) 22.3-6.8 MG/ML ophthalmic solution,  .  EASY COMFORT INSULIN SYRINGE 30G X 1/2" 0.5 ML MISC,  .  latanoprost (XALATAN) 0.005 % ophthalmic solution,  .  LOTEMAX 0.5 % GEL,  .  Magnesium 500 MG CAPS, Take 500 mg by mouth 2 (two) times daily. .  Multiple Vitamins-Minerals (HAIR/SKIN/NAILS) TABS, Take by mouth 2 (two) times daily. Marland Kitchen  rOPINIRole (REQUIP) 3 MG tablet, TAKE 1 TABLET BY MOUTH TWICE DAILY AND TAKE TWO TABLETS AT BEDTIME FOR RESTLESS LEGS .  triamcinolone ointment (KENALOG) 0.1 %, Apply 1 application topically 2 (two) times daily.  Medical History:  Past Medical History:  Diagnosis Date  . Arthritis   . Chronic back pain   .  Diabetic neuropathy ([place])   . Hypercholesteremia   . Hypertension   . Liver lesion   . Neuropathy   . Type II or unspecified type diabetes mellitus without mention of complication, not stated as uncontrolled   . Vitamin D deficiency   . Wears glasses    Allergies:  Allergies  Allergen Reactions  . Morphine And Related Other (See Comments)    fuzzy feeling     Patient Care Team Patient Care Team: [name], MD as PCP - General (Internal Medicine) Warden Fillers, MD as Consulting Physician (Optometry) Zadie Rhine, Clent Demark, MD as Consulting Physician (Ophthalmology) Glenna Fellows, MD as Attending Physician (Neurosurgery) Melrose Nakayama, MD as Consulting Physician (Orthopedic Surgery) Milus Banister, MD as Attending Physician (Gastroenterology)  Family history- Review and unchanged Social history- Review and unchanged   Physical Exam: There were no vitals taken for this visit. Wt  Readings from Last 3 Encounters:  12/20/17 117 lb 6.4 oz (53.3 kg)  09/08/17 117 lb (53.1 kg)  08/03/17 115 lb (52.2 kg)   General Appearance: Well nourished, alert, WD/WN, female and in no apparent distress. Eyes: PERRLA, EOMs, conjunctiva no swelling or erythema, normal fundi and vessels. Sinuses: No frontal/maxillary tenderness ENT/Mouth: EACs patent / TMs  nl. Nares clear without erythema, swelling, mucoid exudates. Oral hygiene is good. No erythema, swelling, or exudate. Tongue normal, non-obstructing. Tonsils not swollen or erythematous. Hearing normal.  Neck: Supple, thyroid normal. No bruits, nodes or JVD. Respiratory: Respiratory effort normal.  BS equal and clear bilateral without rales, rhonci, wheezing or stridor. Cardio: Heart sounds are normal with regular rate and rhythm and no murmurs, rubs or gallops. Peripheral pulses are normal and equal bilaterally without edema. No aortic or femoral bruits. Chest: symmetric with normal excursions and percussion. Abdomen: Flat, soft  with nl  bowel sounds. Nontender, no guarding, rebound, hernias, masses, or organomegaly.  Lymphatics: Non tender without lymphadenopathy.  Musculoskeletal: Full ROM all peripheral extremities, joint stability, 5/5 strength, and normal gait. Skin:     Neuro: Cranial nerves intact. DTR's UE  Nl/Equal and DTR's LE's absent.  Normal muscle tone, no cerebellar symptoms. Sensation decreased bilateral feet. Pysch: Alert and oriented X 3, normal affect, Insight and Judgment appropriate.     Vicie Mutters, PA-C 10:10 AM [facility]

## 2018-01-09 DIAGNOSIS — H04123 Dry eye syndrome of bilateral lacrimal glands: Secondary | ICD-10-CM | POA: Diagnosis not present

## 2018-01-09 DIAGNOSIS — Z961 Presence of intraocular lens: Secondary | ICD-10-CM | POA: Diagnosis not present

## 2018-01-09 DIAGNOSIS — H353231 Exudative age-related macular degeneration, bilateral, with active choroidal neovascularization: Secondary | ICD-10-CM | POA: Diagnosis not present

## 2018-01-09 DIAGNOSIS — H401111 Primary open-angle glaucoma, right eye, mild stage: Secondary | ICD-10-CM | POA: Diagnosis not present

## 2018-01-09 DIAGNOSIS — H401122 Primary open-angle glaucoma, left eye, moderate stage: Secondary | ICD-10-CM | POA: Diagnosis not present

## 2018-01-10 DIAGNOSIS — H3530 Unspecified macular degeneration: Secondary | ICD-10-CM | POA: Diagnosis not present

## 2018-01-11 ENCOUNTER — Telehealth: Payer: Self-pay | Admitting: Internal Medicine

## 2018-01-11 MED ORDER — FLUCONAZOLE 150 MG PO TABS: ORAL_TABLET | ORAL | 0 refills | Status: DC

## 2018-01-11 MED ORDER — CEPHALEXIN 250 MG PO CAPS: 250.0000 mg | ORAL_CAPSULE | Freq: Two times a day (BID) | ORAL | 0 refills | Status: DC

## 2018-01-11 NOTE — Addendum Note (Signed)
Addended by: Vicie Mutters R on: 01/11/2018 01:11 PM   Modules accepted: Orders

## 2018-01-11 NOTE — Telephone Encounter (Signed)
Sent in keflex and diflcuan, if not better needs OV to see if yeast/pelvic and recheck urine.

## 2018-01-11 NOTE — Telephone Encounter (Signed)
Patient called UTI Not improving on abx, abdomen area hurting, painful urination, painful to wipe. Please advise stronger abx to new pharmacy. NEW PHARMACY: [facility]

## 2018-01-11 NOTE — Telephone Encounter (Signed)
The patient has been notified of this information and all questions answered.

## 2018-01-18 ENCOUNTER — Encounter: Payer: Self-pay | Admitting: Dietician

## 2018-01-18 ENCOUNTER — Encounter: Payer: Medicare Other | Attending: Internal Medicine | Admitting: Dietician

## 2018-01-18 DIAGNOSIS — Z713 Dietary counseling and surveillance: Secondary | ICD-10-CM | POA: Diagnosis not present

## 2018-01-18 DIAGNOSIS — E118 Type 2 diabetes mellitus with unspecified complications: Secondary | ICD-10-CM | POA: Diagnosis not present

## 2018-01-18 DIAGNOSIS — Z794 Long term (current) use of insulin: Secondary | ICD-10-CM | POA: Insufficient documentation

## 2018-01-18 DIAGNOSIS — N183 Chronic kidney disease, stage 3 unspecified: Secondary | ICD-10-CM

## 2018-01-18 DIAGNOSIS — E1122 Type 2 diabetes mellitus with diabetic chronic kidney disease: Secondary | ICD-10-CM | POA: Insufficient documentation

## 2018-01-18 DIAGNOSIS — E1140 Type 2 diabetes mellitus with diabetic neuropathy, unspecified: Secondary | ICD-10-CM | POA: Insufficient documentation

## 2018-01-18 NOTE — Progress Notes (Signed)
Diabetes Self-Management Education  Visit Type: First/Initial  Appt. Start Time: 0900 Appt. End Time: 1010  01/18/2018  [name], identified by name and date of birth, is a 84 y.o. female with a diagnosis of Diabetes: Type 2.  Other history includes CKD stage 3, neuropathy, HTN, and HLD.  GFR 32 12/20/17, vitamin D 54, Alk Phos 78, A1C 9.6% decreased from 12.1% 08/2017.    Medications include Humalog sliding scale for blood sugar >150.  Lantus 16-20 units each HS.  She stopped taking her Metformin as she was disorganized.  Patient lives with her husband.  They share shopping and cooking.  He does much of this.  Weight 146 lbs 1 year ago and then lost her appetite and did not eat well.  She lost to 117 lbs and is 118 lbs today.  Her husband stated he thought she was depressed.  Her Aunt died last year and patient is going through her things and this is causing stress.  She states that things are disorganized which effects her medication as well.  She cans some of her own food.  ASSESSMENT  Height 5' 4"  (1.626 m), weight 118 lb (53.5 kg). Body mass index is 20.25 kg/m.  Diabetes Self-Management Education - 01/18/18 0921      Visit Information   Visit Type  First/Initial      Initial Visit   Diabetes Type  Type 2    Are you currently following a meal plan?  No    Are you taking your medications as prescribed?  Yes      Health Coping   How would you rate your overall health?  Fair      Psychosocial Assessment   Patient Belief/Attitude about Diabetes  Motivated to manage diabetes    Self-care barriers  Other (comment)   disorganized.   Self-management support  Doctor's office;Family    Other persons present  Patient;Spouse/SO    Patient Concerns  Nutrition/Meal planning;Glycemic Control    Special Needs  None    Preferred Learning Style  No preference indicated    Learning Readiness  Ready    How often do you need to have someone help you when you read instructions,  pamphlets, or other written materials from your doctor or pharmacy?  1 - Never    What is the last grade level you completed in school?  12th grade      Pre-Education Assessment   Patient understands the diabetes disease and treatment process.  Needs Review    Patient understands incorporating nutritional management into lifestyle.  Needs Review    Patient undertands incorporating physical activity into lifestyle.  Needs Review    Patient understands using medications safely.  Needs Review    Patient understands monitoring blood glucose, interpreting and using results  Needs Review    Patient understands prevention, detection, and treatment of acute complications.  Needs Review    Patient understands prevention, detection, and treatment of chronic complications.  Needs Review    Patient understands how to develop strategies to address psychosocial issues.  Needs Review    Patient understands how to develop strategies to promote health/change behavior.  Needs Review      Complications   Last HgB A1C per patient/outside source  9.6 %   12/20/17 decreased from 12.1% 09/08/17   How often do you check your blood sugar?  3-4 times/day    Fasting Blood glucose range (mg/dL)  130-179    Postprandial Blood glucose range (mg/dL)  >  200    Number of hypoglycemic episodes per month  1    Can you tell when your blood sugar is low?  Yes    What do you do if your blood sugar is low?  eat something to bring it up    Number of hyperglycemic episodes per week  7    Can you tell when your blood sugar is high?  No    Have you had a dilated eye exam in the past 12 months?  Yes    Have you had a dental exam in the past 12 months?  Yes    Are you checking your feet?  Yes    How many days per week are you checking your feet?  7      Dietary Intake   Breakfast  regular cooked oatmeal with berries or peaches, stevia or splenda OR sausage or bacon, scrambled eggs, toast OR chipped beef gravy biscuits   11   Snack  (morning)  none    Lunch  seldom OR sandwich     Snack (afternoon)  none    Dinner  [employer] (beef tips, creamed corn, carrot raisin salad, small sliver egg custard)   6   Snack (evening)  plain cheerios, 2% milk    Beverage(s)  water, iced tea with stevia, or half and half sweet/unsweet tea, seldom diet soda      Exercise   Exercise Type  ADL's   walking     Patient Education   Previous Diabetes Education  Yes (please comment)   when with Dr. Buddy Duty   Disease state   Definition of diabetes, type 1 and 2, and the diagnosis of diabetes;Explored patient's options for treatment of their diabetes    Nutrition management   Food label reading, portion sizes and measuring food.;Role of diet in the treatment of diabetes and the relationship between the three main macronutrients and blood glucose level;Meal options for control of blood glucose level and chronic complications.;Information on hints to eating out and maintain blood glucose control.;Meal timing in regards to the patients' current diabetes medication.    Physical activity and exercise   Role of exercise on diabetes management, blood pressure control and cardiac health.    Medications  Reviewed patients medication for diabetes, action, purpose, timing of dose and side effects.    Monitoring  Purpose and frequency of SMBG.;Identified appropriate SMBG and/or A1C goals.;Yearly dilated eye exam;Daily foot exams    Acute complications  Taught treatment of hypoglycemia - the 15 rule.    Chronic complications  Relationship between chronic complications and blood glucose control;Identified and discussed with patient  current chronic complications      Individualized Goals (developed by patient)   Nutrition  General guidelines for healthy choices and portions discussed    Monitoring   test my blood glucose as discussed    Reducing Risk  examine blood glucose patterns    Health Coping  discuss diabetes with (comment)   MD, RD, CDE     Post-Education  Assessment   Patient understands the diabetes disease and treatment process.  Demonstrates understanding / competency    Patient understands incorporating nutritional management into lifestyle.  Demonstrates understanding / competency    Patient undertands incorporating physical activity into lifestyle.  Demonstrates understanding / competency    Patient understands using medications safely.  Demonstrates understanding / competency    Patient understands monitoring blood glucose, interpreting and using results  Demonstrates understanding / competency    Patient  understands prevention, detection, and treatment of acute complications.  Demonstrates understanding / competency    Patient understands prevention, detection, and treatment of chronic complications.  Demonstrates understanding / competency    Patient understands how to develop strategies to address psychosocial issues.  Demonstrates understanding / competency    Patient understands how to develop strategies to promote health/change behavior.  Demonstrates understanding / competency      Outcomes   Expected Outcomes  Demonstrated interest in learning. Expect positive outcomes    Future DMSE  PRN    Program Status  Completed       Individualized Plan for Diabetes Self-Management Training:   Learning Objective:  Patient will have a greater understanding of diabetes self-management. Patient education plan is to attend individual and/or group sessions per assessed needs and concerns.   Plan:   Patient Instructions  -Low sodium diet -Avoid dark soda (coke) -Consider having a small meal/snack mid day -Are you hungry for your snack at night?  Maybe skip this but watch what your blood sugar is in the morning.  If you are low in the morning then have a snack with a small amount of protein each night. -Avoid any beverage with sugar.  -Continue to be mindful about your meal choices. -Continue to check your blood sugar  Times to consider  are before breakfast, 2 hours after breakfast, and 2 hours after supper. -Low fat (avoid sausage, bacon, gravy overall and bake rather than fry)   Expected Outcomes:  Demonstrated interest in learning. Expect positive outcomes  Education material provided: ADA Diabetes: Your Take Control Guide, Food label handouts, Meal plan card and Snack sheet  If problems or questions, patient to contact team via:  Phone  Future DSME appointment: PRN

## 2018-01-18 NOTE — Patient Instructions (Addendum)
-  Low sodium diet -Avoid dark soda (coke) -Consider having a small meal/snack mid day -Are you hungry for your snack at night?  Maybe skip this but watch what your blood sugar is in the morning.  If you are low in the morning then have a snack with a small amount of protein each night. -Avoid any beverage with sugar.  -Continue to be mindful about your meal choices. -Continue to check your blood sugar  Times to consider are before breakfast, 2 hours after breakfast, and 2 hours after supper. -Low fat (avoid sausage, bacon, gravy overall and bake rather than fry)

## 2018-02-06 NOTE — Progress Notes (Signed)
Comprehensive Care Management   Comprehensive care plan:  Essential hypertension At goal - continue medications, DASH diet, exercise and monitor at home. Call if greater than 130/80.   CKD stage 3 due to type 2 diabetes mellitus (HCC) -     COMPLETE METABOLIC PANEL WITH GFR -     metFORMIN (GLUCOPHAGE) 1000 MG tablet; TAKE ONE TABLET WITH YOUR LARGEST MEAL/DINNER - add on metformin - continue to keep log and BRING IN NEXT OV  Neuropathy due to type 2 diabetes mellitus (HCC) -     COMPLETE METABOLIC PANEL WITH GFR -     metFORMIN (GLUCOPHAGE) 1000 MG tablet; TAKE ONE TABLET WITH YOUR LARGEST MEAL/DINNER - CHECK FEET DAILY  Diabetic retinopathy associated with type 2 diabetes mellitus, macular edema presence unspecified, unspecified laterality, unspecified retinopathy severity (HCC) -     COMPLETE METABOLIC PANEL WITH GFR -     metFORMIN (GLUCOPHAGE) 1000 MG tablet; TAKE ONE TABLET WITH YOUR LARGEST MEAL/DINNER - HAS OV WITH RANKIN Monday, WILL REQUEST EYE EXAM AND ABSTRACT  Type 2 diabetes mellitus with complication, with long-term current use of insulin (HCC) -     COMPLETE METABOLIC PANEL WITH GFR -     metFORMIN (GLUCOPHAGE) 1000 MG tablet; TAKE ONE TABLET WITH YOUR LARGEST MEAL/DINNER - CONTINUE TO CHECK SUGARS 3-4 TIMES A DAY - DISCUSSED HYPOGLYCEMIA.   Mixed hyperlipidemia -     Lipid panel check lipids with crestor- patient is unsure if she is on crestor- will find out NEEDS TO BRING IN MEDICATIONS NEXT OV TO REVIEW - IF CHOLESTEROL IS BETTER WILL TRY TO STOP FENOFIBRATE decrease fatty foods increase activity.   Atherosclerosis of aorta (HCC) Control blood pressure, cholesterol, glucose, increase exercise.   Needs flu shot -     Flu vaccine HIGH DOSE PF    Patient/caregiver was given comprehensive care plan  We will continue to monitor these goals every 3 months with an office visit and every month by a telephone call  Patient can contact the office any time  with the phone number and get to a provider via the answering service or they can use Mychart.   Verbal permission was received from the patient to review comprehensive care management, they understand they have the right to stop CCM services at any time.   The patient is self managing medications at home- [place] NEXT TIME Medications were reviewed with the patient today as well as adherence and potential interactions.   Community based clinical services were discussed   The patient does not need home health services at this time.  Continue diet and meds as discussed. Further disposition pending results of labs. Over 30 minutes of exam, counseling, chart review, and critical decision making was performed  Future Appointments  Date Time Provider [place]  02/08/2018 11:15 AM Vicie Mutters, PA-C [facility] None  04/05/2018 10:45 AM Vicie Mutters, PA-C [facility] None  07/05/2018 10:00 AM [name], MD [facility] None     HPI 84 y.o. female  presents for a Comprehensive care management for chronic problems.    Patient has chronic care issues that we are reviewing today in addition to other questions that are addressed.   She was restarted on requip for restless leg at night and states that her legs are better.  She is no longer having UTI issues.  She is having back pain and following with ortho for this, on tramadol PRN.   She does not drive, husband drove her.  Patient does her own medications at home she does her own pill box and states with this she feels confident doing her medciations  Diabetes insulin dependent She saw Rosalyn Gess, RD for education.  She states the sheets she has to help keep up with her insulin and eating has helped the most. But she did not bring in her logs today. She is checking her sugars 3 x a day.  She has not been using her humolog, she is taking 16 units of her lantus.  She states her morning sugars have been  100-110. She had a 78 one time which was the lowest sugar, she had hypoglycemic awareness.  She has NOT been taking her metformin.   She follows Dr. Zadie Rhine- has appointment Monday or Tuesday.    Lab Results  Component Value Date   HGBA1C 9.6 (H) 12/20/2017    Cholesterol  Her pravastatin was switched to crestor last visit. We will recheck her cholesterol, she would like to stop the fenofibrate if she can.    Lab Results  Component Value Date   CHOL 193 12/20/2017   HDL 42 (L) 12/20/2017   LDLCALC 119 (H) 12/20/2017   TRIG 200 (H) 12/20/2017   CHOLHDL 4.6 12/20/2017   Blood pressure 118/72, pulse 60, temperature 97.8 F (36.6 C), resp. rate 16, weight 121 lb (54.9 kg), SpO2 98 %.   Current Medications:   Current Outpatient Medications (Endocrine & Metabolic):  .  insulin lispro (HUMALOG) 100 UNIT/ML injection, Inject 0.05-0.1 mLs (5-10 Units total) 3 (three) times daily with meals into the skin. Patient takes Rx on a sliding scale basis .  LANTUS SOLOSTAR 100 UNIT/ML Solostar Pen, INJECT 30 UNITS INTO THE SKIN AT BEDTIME OR AS DIRECTED TO CONTROL BLOOD SUGARS .  metFORMIN (GLUCOPHAGE) 1000 MG tablet, TAKE ONE TABLET BY MOUTH TWICE A DAY AFTER MEALS FOR DIABETES (Patient not taking: Reported on 01/18/2018)  Current Outpatient Medications (Cardiovascular):  .  bisoprolol-hydrochlorothiazide (ZIAC) 5-6.25 MG tablet, TAKE ONE TABLET BY MOUTH DAILY FOR FOR BLOOD PRESSURE (Patient taking differently: TAKE 1/2 TABLET BY MOUTH DAILY FOR FOR BLOOD PRESSURE) .  fenofibrate micronized (LOFIBRA) 134 MG capsule, TAKE ONE CAPSULE BY MOUTH  DAILY FOR BLOOD FATS .  rosuvastatin (CRESTOR) 20 MG tablet, Take 1 tablet (20 mg total) by mouth at bedtime.   Current Outpatient Medications (Analgesics):  .  aspirin 81 MG tablet, Take 81 mg by mouth daily.  Current Outpatient Medications (Hematological):  .  vitamin B-12 (CYANOCOBALAMIN) 1000 MCG tablet, Take 1,000 mcg by mouth daily.  Current  Outpatient Medications (Other):  Marland Kitchen  ACCU-CHEK AVIVA PLUS test strip, Test 3 times a day .  amoxicillin-clavulanate (AUGMENTIN) 250-125 MG tablet, Take 1 tablet 3 x /day with food  for Urinary  Tract   Infection (Patient not taking: Reported on 01/18/2018) .  Apple Cider Vinegar 188 MG CAPS, Take by mouth. .  ASSURE COMFORT LANCETS 30G MISC, Check blood sugar 3 times daily-DX-E11.22 .  Blood Glucose Calibration (ACCU-CHEK AVIVA) SOLN, Check blood sugar 3 times daily-DX-E11.22 .  Blood Glucose Monitoring Suppl (ACCU-CHEK AVIVA) device, Check blood sugar 3 times a day.  DX-E11.22 .  Cholecalciferol (VITAMIN D3) 2000 UNITS TABS, Take 1 capsule by mouth 2 (two) times daily. .  Cinnamon 500 MG TABS, Take by mouth 3 (three) times daily. .  dorzolamide-timolol (COSOPT) 22.3-6.8 MG/ML ophthalmic solution,  .  EASY COMFORT INSULIN SYRINGE 30G X 1/2" 0.5 ML MISC,  .  fluconazole (DIFLUCAN) 150 MG  tablet, Take one tablet every other day .  latanoprost (XALATAN) 0.005 % ophthalmic solution,  .  LOTEMAX 0.5 % GEL,  .  Magnesium 500 MG CAPS, Take 500 mg by mouth 2 (two) times daily. .  Multiple Vitamins-Minerals (HAIR/SKIN/NAILS) TABS, Take by mouth 2 (two) times daily. Marland Kitchen  rOPINIRole (REQUIP) 3 MG tablet, TAKE TWO TABLETS AT BEDTIME FOR RESTLESS LEGS .  triamcinolone ointment (KENALOG) 0.1 %, Apply 1 application topically 2 (two) times daily.  Medical History:  Past Medical History:  Diagnosis Date  . Arthritis   . Chronic back pain   . Diabetic neuropathy ([place])   . Hypercholesteremia   . Hypertension   . Liver lesion   . Neuropathy   . Type II or unspecified type diabetes mellitus without mention of complication, not stated as uncontrolled   . Vitamin D deficiency   . Wears glasses    Allergies:  Allergies  Allergen Reactions  . Morphine And Related Other (See Comments)    fuzzy feeling     Patient Care Team Patient Care Team: [name], MD as PCP - General (Internal  Medicine) Warden Fillers, MD as Consulting Physician (Optometry) Zadie Rhine, Clent Demark, MD as Consulting Physician (Ophthalmology) Glenna Fellows, MD as Attending Physician (Neurosurgery) Melrose Nakayama, MD as Consulting Physician (Orthopedic Surgery) Milus Banister, MD as Attending Physician (Gastroenterology)  Family history- Review and unchanged Social history- Review and unchanged   Physical Exam: There were no vitals taken for this visit. Wt Readings from Last 3 Encounters:  01/18/18 118 lb (53.5 kg)  01/02/18 117 lb (53.1 kg)  12/20/17 117 lb 6.4 oz (53.3 kg)   General Appearance: Well nourished, alert, WD/WN, female and in no apparent distress. Eyes: PERRLA, EOMs, conjunctiva no swelling or erythema, normal fundi and vessels. Sinuses: No frontal/maxillary tenderness ENT/Mouth: EACs patent / TMs  nl. Nares clear without erythema, swelling, mucoid exudates. Oral hygiene is good. No erythema, swelling, or exudate. Tongue normal, non-obstructing. Tonsils not swollen or erythematous. Hearing normal.  Neck: Supple, thyroid normal. No bruits, nodes or JVD. Respiratory: Respiratory effort normal.  BS equal and clear bilateral without rales, rhonci, wheezing or stridor. Cardio: Heart sounds are normal with regular rate and rhythm and no murmurs, rubs or gallops. Peripheral pulses are normal and equal bilaterally without edema. No aortic or femoral bruits. Chest: symmetric with normal excursions and percussion. Abdomen: Flat, soft  with nl bowel sounds. Nontender, no guarding, rebound, hernias, masses, or organomegaly.  Lymphatics: Non tender without lymphadenopathy.  Musculoskeletal: Full ROM all peripheral extremities, joint stability, 5/5 strength, and normal gait. Neuro: Cranial nerves intact. DTR's UE  Nl/Equal and DTR's LE's absent.  Normal muscle tone, no cerebellar symptoms. Sensation decreased bilateral feet. Pysch: Alert and oriented X 3, normal affect, Insight and Judgment  appropriate.     Vicie Mutters, PA-C 1:48 PM [facility]

## 2018-02-07 ENCOUNTER — Ambulatory Visit: Payer: Self-pay | Admitting: Physician Assistant

## 2018-02-08 ENCOUNTER — Ambulatory Visit: Payer: Medicare Other | Admitting: Physician Assistant

## 2018-02-08 ENCOUNTER — Encounter: Payer: Self-pay | Admitting: Physician Assistant

## 2018-02-08 VITALS — BP 118/72 | HR 60 | Temp 97.8°F | Resp 16 | Wt 121.0 lb

## 2018-02-08 DIAGNOSIS — E11319 Type 2 diabetes mellitus with unspecified diabetic retinopathy without macular edema: Secondary | ICD-10-CM

## 2018-02-08 DIAGNOSIS — N183 Chronic kidney disease, stage 3 unspecified: Secondary | ICD-10-CM

## 2018-02-08 DIAGNOSIS — E1140 Type 2 diabetes mellitus with diabetic neuropathy, unspecified: Secondary | ICD-10-CM

## 2018-02-08 DIAGNOSIS — E118 Type 2 diabetes mellitus with unspecified complications: Secondary | ICD-10-CM

## 2018-02-08 DIAGNOSIS — I700 Atherosclerosis of aorta: Secondary | ICD-10-CM

## 2018-02-08 DIAGNOSIS — Z794 Long term (current) use of insulin: Secondary | ICD-10-CM

## 2018-02-08 DIAGNOSIS — I10 Essential (primary) hypertension: Secondary | ICD-10-CM

## 2018-02-08 DIAGNOSIS — E782 Mixed hyperlipidemia: Secondary | ICD-10-CM | POA: Diagnosis not present

## 2018-02-08 DIAGNOSIS — Z23 Encounter for immunization: Secondary | ICD-10-CM

## 2018-02-08 DIAGNOSIS — E1122 Type 2 diabetes mellitus with diabetic chronic kidney disease: Secondary | ICD-10-CM

## 2018-02-08 LAB — COMPLETE METABOLIC PANEL WITH GFR
AG Ratio: 1.3 (calc) (ref 1.0–2.5)
ALT: 22 U/L (ref 6–29)
AST: 32 U/L (ref 10–35)
Albumin: 3.9 g/dL (ref 3.6–5.1)
Alkaline phosphatase (APISO): 71 U/L (ref 33–130)
BUN/Creatinine Ratio: 16 (calc) (ref 6–22)
BUN: 21 mg/dL (ref 7–25)
CO2: 31 mmol/L (ref 20–32)
Calcium: 9.8 mg/dL (ref 8.6–10.4)
Chloride: 100 mmol/L (ref 98–110)
Creat: 1.29 mg/dL — ABNORMAL HIGH (ref 0.60–0.88)
GFR, Est African American: 43 mL/min/{1.73_m2} — ABNORMAL LOW (ref >=60)
GFR, Est Non African American: 37 mL/min/{1.73_m2} — ABNORMAL LOW (ref >=60)
Globulin: 2.9 g/dL (calc) (ref 1.9–3.7)
Glucose, Bld: 222 mg/dL — ABNORMAL HIGH (ref 65–99)
Potassium: 4.7 mmol/L (ref 3.5–5.3)
Sodium: 139 mmol/L (ref 135–146)
Total Bilirubin: 0.4 mg/dL (ref 0.2–1.2)
Total Protein: 6.8 g/dL (ref 6.1–8.1)

## 2018-02-08 LAB — LIPID PANEL
Cholesterol: 122 mg/dL (ref <200)
HDL: 41 mg/dL — ABNORMAL LOW (ref >50)
LDL Cholesterol (Calc): 61 mg/dL (calc)
Non-HDL Cholesterol (Calc): 81 mg/dL (calc) (ref <130)
Total CHOL/HDL Ratio: 3 (calc) (ref <5.0)
Triglycerides: 117 mg/dL (ref <150)

## 2018-02-08 MED ORDER — METFORMIN HCL 1000 MG PO TABS: ORAL_TABLET | ORAL | 0 refills | Status: DC

## 2018-02-08 NOTE — Patient Instructions (Addendum)
Chronic Care Management  This is your comprehensive care plan.  We will continue to monitor these chronic health issues and see if we are achieving our goals every 3 months or more.  You can contact the office any time with the office phone number and get to a provider via the answering service or you can use Mychart, if you are not signed up they can do so at the front office.  Here is some information that we discussed and went over below.   BRING IN YOUR MEDICATIONS NEXT VISIT BRING IN YOUR SUGAR LOG NEXT VISIT START ON 1 METFORMIN A DAY WITH DINNER YOUR LARGEST MEAL and this may bring down your morning sugar FURTHER, if  Your morning sugar is below 100 consistently cut back on your lantus BY 2 UNITS We are starting you on Metformin to prevent or treat diabetes.  Metformin does NOT cause low blood sugars.   In order to create energy your cells need insulin and sugar but sometime your cells do not accept the insulin and this can cause increased sugars and decreased energy.   The Metformin helps your cells accept insulin and the sugar this help: 1) increase your energy  2) weight loss.    The two most common side effects are nausea and diarrhea, follow these rules to avoid it but these symptoms get better with time on the medication.    ALSO You can take imodium per box instructions when starting metformin if needed.   Rules of metformin: 1) start out slow with only one pill daily. Our goal for you is 4 pills a day or 2000mg  total.  2) take with your largest meal. 3) Take with least amount of carbs.   Call if you have any problems.   What does your A1C results mean?  Your A1C is a measure of your sugar over the past 3 months   Use this chart as a guide to compare the results of your A1C blood test to your estimated average daily blood sugar:  A1C Range Average Sugar  4.0-6.0% 60-120 mg/dl  6.1-7.0% 121 - 150 mg/dl  7.1-8.0% 151-180 mg/dl  8.1-9.0% 181-210 mg/dl  10.1-11%  211-240 mg/dl  11.1-12.0% 271-300 mg/dl  12.1-13.0% 301-330 mg/dl  13.1-14.0% 331-360 mg/dl  Greater than 14.0% Greater than 360 mg/dl    If your morning sugar is always below 100 then the issue is with your sugar spiking after meals. Try to take your blood sugar approximately 2 hours after eating, this number should be less than 200. If it is not, think about the foods that you ate and better choices you can make.    These are the goals we discussed: Goals (1 Years of Data) as of 02/08/2018 at 11:30 AM            12/20/17   09/08/17   06/05/17   02/27/17   11/07/16     Result Component   . HEMOGLOBIN A1C < 8.0   9.61   12.11   8.41   10.81   8.01     Note created  02/08/2018 11:29 AM by Vicie Mutters, PA-C    Continue to keep track of your food and insulin Continue to check your sugars regularly and  BRING IN YOUR LOG next visit  start on 1 metformin a day with your largest meal and this may bring down your morning sugar, if this happens cut back on your lantus    . LDL CALC < 70  76    Note edited  02/08/2018 11:30 AM by Vicie Mutters, PA-C    Will recheck on the crestor/rouvastatin May be able to stop the fenofibrate if your trigs are better with your sugars.  BRING IN ALL OF YOUR MEDICATIONS       1  Progress evaluated against: HEMOGLOBIN A1C < 7.0    Problem list Patient Active Problem List   Diagnosis Date Noted  . Enrolled in chronic care management 01/02/2018  . Type 2 diabetes mellitus with complication, with long-term current use of insulin ([place]) 02/09/2017  . Atherosclerosis of aorta ([place]) 11/06/2016  . Diabetic retinopathy ([place]) 05/18/2015  . Encounter for Medicare annual wellness exam 05/18/2015  . Medication management 08/06/2013  . Neuropathy due to type 2 diabetes mellitus ([place]) 06/04/2013  . CKD stage 3 due to type 2 diabetes mellitus ([place])   . Mixed hyperlipidemia   . Vitamin D deficiency   . Hypertension 07/29/2012  . Chronic pain  07/29/2012    Medication List Current Outpatient Medications on File Prior to Visit  Medication Sig  . ACCU-CHEK AVIVA PLUS test strip Test 3 times a day  . Apple Cider Vinegar 188 MG CAPS Take by mouth.  Marland Kitchen aspirin 81 MG tablet Take 81 mg by mouth daily.  . ASSURE COMFORT LANCETS 30G MISC Check blood sugar 3 times daily-DX-E11.22  . bisoprolol-hydrochlorothiazide (ZIAC) 5-6.25 MG tablet TAKE ONE TABLET BY MOUTH DAILY FOR FOR BLOOD PRESSURE (Patient taking differently: TAKE 1/2 TABLET BY MOUTH DAILY FOR FOR BLOOD PRESSURE)  . Blood Glucose Calibration (ACCU-CHEK AVIVA) SOLN Check blood sugar 3 times daily-DX-E11.22  . Blood Glucose Monitoring Suppl (ACCU-CHEK AVIVA) device Check blood sugar 3 times a day.  DX-E11.22  . Cholecalciferol (VITAMIN D3) 2000 UNITS TABS Take 1 capsule by mouth 2 (two) times daily.  . Cinnamon 500 MG TABS Take by mouth 3 (three) times daily.  . dorzolamide-timolol (COSOPT) 22.3-6.8 MG/ML ophthalmic solution   . EASY COMFORT INSULIN SYRINGE 30G X 1/2" 0.5 ML MISC   . fenofibrate micronized (LOFIBRA) 134 MG capsule TAKE ONE CAPSULE BY MOUTH  DAILY FOR BLOOD FATS  . insulin lispro (HUMALOG) 100 UNIT/ML injection Inject 0.05-0.1 mLs (5-10 Units total) 3 (three) times daily with meals into the skin. Patient takes Rx on a sliding scale basis  . LANTUS SOLOSTAR 100 UNIT/ML Solostar Pen INJECT 30 UNITS INTO THE SKIN AT BEDTIME OR AS DIRECTED TO CONTROL BLOOD SUGARS  . latanoprost (XALATAN) 0.005 % ophthalmic solution   . LOTEMAX 0.5 % GEL   . Magnesium 500 MG CAPS Take 500 mg by mouth 2 (two) times daily.  . metFORMIN (GLUCOPHAGE) 1000 MG tablet TAKE ONE TABLET BY MOUTH TWICE A DAY AFTER MEALS FOR DIABETES  . Multiple Vitamins-Minerals (HAIR/SKIN/NAILS) TABS Take by mouth 2 (two) times daily.  Marland Kitchen rOPINIRole (REQUIP) 3 MG tablet TAKE TWO TABLETS AT BEDTIME FOR RESTLESS LEGS  . rosuvastatin (CRESTOR) 20 MG tablet Take 1 tablet (20 mg total) by mouth at bedtime.  .  triamcinolone ointment (KENALOG) 0.1 % Apply 1 application topically 2 (two) times daily.  . vitamin B-12 (CYANOCOBALAMIN) 1000 MCG tablet Take 1,000 mcg by mouth daily.   No current facility-administered medications on file prior to visit.     Allergies Allergies  Allergen Reactions  . Morphine And Related Other (See Comments)    fuzzy feeling    Patient Care Team Patient Care Team: [name], MD as PCP - General (Internal Medicine) Warden Fillers, MD as Consulting  Physician (Optometry) Zadie Rhine, Clent Demark, MD as Consulting Physician (Ophthalmology) Glenna Fellows, MD as Attending Physician (Neurosurgery) Melrose Nakayama, MD as Consulting Physician (Orthopedic Surgery) Milus Banister, MD as Attending Physician (Gastroenterology)   This is a list of the screening recommended for you and due dates:  Health Maintenance  Topic Date Due  . Eye exam for diabetics  10/04/2017  . Flu Shot  11/23/2017  . Hemoglobin A1C  06/22/2018  . Complete foot exam   12/21/2018  . Tetanus Vaccine  01/03/2021  . DEXA scan (bone density measurement)  Completed  . Pneumonia vaccines  Completed     These are your follow up appointments: Future Appointments  Date Time Provider [place]  04/05/2018 10:45 AM Vicie Mutters, PA-C [facility] None  07/05/2018 10:00 AM [name], MD [facility] None

## 2018-02-09 ENCOUNTER — Other Ambulatory Visit: Payer: Self-pay | Admitting: Internal Medicine

## 2018-02-09 DIAGNOSIS — E118 Type 2 diabetes mellitus with unspecified complications: Secondary | ICD-10-CM

## 2018-02-09 DIAGNOSIS — E782 Mixed hyperlipidemia: Secondary | ICD-10-CM

## 2018-02-09 DIAGNOSIS — N183 Chronic kidney disease, stage 3 unspecified: Secondary | ICD-10-CM

## 2018-02-09 DIAGNOSIS — E11319 Type 2 diabetes mellitus with unspecified diabetic retinopathy without macular edema: Secondary | ICD-10-CM

## 2018-02-09 DIAGNOSIS — Z794 Long term (current) use of insulin: Secondary | ICD-10-CM

## 2018-02-09 DIAGNOSIS — E1140 Type 2 diabetes mellitus with diabetic neuropathy, unspecified: Secondary | ICD-10-CM

## 2018-02-09 DIAGNOSIS — E1122 Type 2 diabetes mellitus with diabetic chronic kidney disease: Secondary | ICD-10-CM

## 2018-02-09 MED ORDER — INSULIN GLARGINE 100 UNIT/ML SOLOSTAR PEN: PEN_INJECTOR | SUBCUTANEOUS | 3 refills | Status: DC

## 2018-02-09 MED ORDER — METFORMIN HCL 1000 MG PO TABS: ORAL_TABLET | ORAL | 3 refills | Status: DC

## 2018-02-09 MED ORDER — FENOFIBRATE MICRONIZED 134 MG PO CAPS: ORAL_CAPSULE | ORAL | 3 refills | Status: DC

## 2018-02-12 DIAGNOSIS — H43811 Vitreous degeneration, right eye: Secondary | ICD-10-CM | POA: Diagnosis not present

## 2018-02-12 DIAGNOSIS — H35351 Cystoid macular degeneration, right eye: Secondary | ICD-10-CM | POA: Diagnosis not present

## 2018-02-12 DIAGNOSIS — E119 Type 2 diabetes mellitus without complications: Secondary | ICD-10-CM | POA: Diagnosis not present

## 2018-02-12 DIAGNOSIS — H353211 Exudative age-related macular degeneration, right eye, with active choroidal neovascularization: Secondary | ICD-10-CM | POA: Diagnosis not present

## 2018-02-12 LAB — HM DIABETES EYE EXAM

## 2018-02-14 DIAGNOSIS — H3530 Unspecified macular degeneration: Secondary | ICD-10-CM | POA: Diagnosis not present

## 2018-02-20 ENCOUNTER — Encounter: Payer: Self-pay | Admitting: *Deleted

## 2018-02-23 DIAGNOSIS — M47816 Spondylosis without myelopathy or radiculopathy, lumbar region: Secondary | ICD-10-CM | POA: Diagnosis not present

## 2018-03-07 DIAGNOSIS — M67912 Unspecified disorder of synovium and tendon, left shoulder: Secondary | ICD-10-CM | POA: Diagnosis not present

## 2018-03-07 DIAGNOSIS — M25551 Pain in right hip: Secondary | ICD-10-CM | POA: Diagnosis not present

## 2018-03-12 ENCOUNTER — Telehealth: Payer: Self-pay

## 2018-03-12 NOTE — Telephone Encounter (Signed)
LVM for pt to return call To follow up with patient care

## 2018-03-30 DIAGNOSIS — M542 Cervicalgia: Secondary | ICD-10-CM | POA: Diagnosis not present

## 2018-04-04 NOTE — Progress Notes (Signed)
FOLLOW UP  Assessment and Plan:   Hypertension At goal with current medications  Monitor blood pressure at home; patient to call if consistently greater than 150/90 (maintain higher goal due to hx of stenosis with dizziness) Continue DASH diet.   Reminder to go to the ER if any CP, SOB, nausea, dizziness, severe HA, changes vision/speech, left arm numbness and tingling and jaw pain.  Cholesterol Currently above typical goal of LDL <70 for diabetic; continue statin Continue low cholesterol diet and exercise.  Check lipid panel.  - will stop the fenofibrate  Diabetes with diabetic chronic kidney disease Continue medication: metformin,  lantus - discussed these parameters today again.  Continue diet and exercise.  Perform daily foot/skin check, notify office of any concerning changes.  Check A1C  BMI 20  Continue to recommend diet heavy in fruits and veggies and low in animal meats, cheeses, and dairy products, appropriate calorie intake Discuss exercise recommendations routinely Continue to monitor weight at each visit  Vitamin D Def At goal at last visit; continue supplementation to maintain goal of 70-100 Defer Vit D level to next visit with Dr. Melford Aase  CCM Continue to keep track of your food and insulin Suggest checking your sugar BEFORE dinner and bring that in with you Continue to check your sugars regularly and  BRING IN YOUR LOG next visit INCREASE YOUR METFORMIN TO ONE PILL WITH BREAKFAST AND ONE PILL WITH DINNER  THIS CAN DECREASE YOUR SUGARS IN THE MORNING SO DECREASE YOUR LANTUS IN THE MORNING TO 14 UNITS  A LOW SUGAR IS MORE DANGEROUS THAN A HIGH SUGAR  THE LANTUS WILL AFFECT YOUR MORNING SUGAR  INCREASE VEGGIES  Continue diet and meds as discussed. Further disposition pending results of labs. Discussed med's effects and SE's.   Over 30 minutes of exam, counseling, chart review, and critical decision making was performed.   Future Appointments  Date Time  Provider [place]  07/05/2018 10:00 AM [name], MD [facility] None    ----------------------------------------------------------------------------------------------------------------------  HPI 84 y.o. female  presents for 3 month follow up on hypertension, cholesterol, diabetes, weight and vitamin D deficiency.   BMI is Body mass index is 20.4 kg/m., she has been working on diet and exercise. Wt Readings from Last 3 Encounters:  04/05/18 117 lb (53.1 kg)  02/08/18 121 lb (54.9 kg)  01/18/18 118 lb (53.5 kg)   Her blood pressure has been controlled at home today their BP is BP: 116/76  She does workout. She denies chest pain, shortness of breath, dizziness.   She is on cholesterol medication and denies myalgias. Her cholesterol is at goal of LDL <70, on goal with crestor and fenofibrate The cholesterol last visit was:   Lab Results  Component Value Date   CHOL 122 02/08/2018   HDL 41 (L) 02/08/2018   LDLCALC 61 02/08/2018   TRIG 117 02/08/2018   CHOLHDL 3.0 02/08/2018    She has been working on diet and exercise for T2 diabetes, and denies foot ulcerations, increased appetite, nausea, paresthesia of the feet, polydipsia, polyuria, visual disturbances, vomiting and weight loss. She does check a fasting glucose and presents a log demonstrating values much improved from last time ranging from 83-150, reports she has cut out night time snacking completely, not eating sweets. Has had 3 low sugars, she does feel them, lowest was 50, will drink OJ.  She is doing 16-20 units of lantus at night, she has not been taking the lispro, takes her sugar after dinner.  She is tolerating the metofmrin once a day.    Last A1C in the office was:  Lab Results  Component Value Date   HGBA1C 9.6 (H) 12/20/2017   Patient is on Vitamin D supplement and at goal at last check:   Lab Results  Component Value Date   VD25OH 54 12/20/2017      Current Medications:  Current Outpatient  Medications on File Prior to Visit  Medication Sig  . ACCU-CHEK AVIVA PLUS test strip Test 3 times a day  . Apple Cider Vinegar 188 MG CAPS Take by mouth.  Marland Kitchen aspirin 81 MG tablet Take 81 mg by mouth daily.  . ASSURE COMFORT LANCETS 30G MISC Check blood sugar 3 times daily-DX-E11.22  . bisoprolol-hydrochlorothiazide (ZIAC) 5-6.25 MG tablet TAKE ONE TABLET BY MOUTH DAILY FOR FOR BLOOD PRESSURE (Patient taking differently: TAKE 1/2 TABLET BY MOUTH DAILY FOR FOR BLOOD PRESSURE)  . Blood Glucose Calibration (ACCU-CHEK AVIVA) SOLN Check blood sugar 3 times daily-DX-E11.22  . Blood Glucose Monitoring Suppl (ACCU-CHEK AVIVA) device Check blood sugar 3 times a day.  DX-E11.22  . Cholecalciferol (VITAMIN D3) 2000 UNITS TABS Take 1 capsule by mouth 2 (two) times daily.  . Cinnamon 500 MG TABS Take by mouth 3 (three) times daily.  . dorzolamide-timolol (COSOPT) 22.3-6.8 MG/ML ophthalmic solution   . EASY COMFORT INSULIN SYRINGE 30G X 1/2" 0.5 ML MISC   . fenofibrate micronized (LOFIBRA) 134 MG capsule TAKE ONE CAPSULE BY MOUTH  DAILY FOR BLOOD FATS  . Insulin Glargine (LANTUS SOLOSTAR) 100 UNIT/ML Solostar Pen INJECT 30 UNITS INTO THE SKIN AT BEDTIME OR AS DIRECTED TO CONTROL BLOOD SUGARS  . insulin lispro (HUMALOG) 100 UNIT/ML injection Inject 0.05-0.1 mLs (5-10 Units total) 3 (three) times daily with meals into the skin. Patient takes Rx on a sliding scale basis  . latanoprost (XALATAN) 0.005 % ophthalmic solution   . LOTEMAX 0.5 % GEL   . Magnesium 500 MG CAPS Take 500 mg by mouth 2 (two) times daily.  . metFORMIN (GLUCOPHAGE) 1000 MG tablet Take 1 tablet 2 x /day  . Multiple Vitamins-Minerals (HAIR/SKIN/NAILS) TABS Take by mouth 2 (two) times daily.  Marland Kitchen rOPINIRole (REQUIP) 3 MG tablet TAKE TWO TABLETS AT BEDTIME FOR RESTLESS LEGS  . rosuvastatin (CRESTOR) 20 MG tablet Take 1 tablet (20 mg total) by mouth at bedtime.  . triamcinolone ointment (KENALOG) 0.1 % Apply 1 application topically 2 (two) times  daily.  . vitamin B-12 (CYANOCOBALAMIN) 1000 MCG tablet Take 1,000 mcg by mouth daily.   No current facility-administered medications on file prior to visit.      Allergies:  Allergies  Allergen Reactions  . Morphine And Related Other (See Comments)    fuzzy feeling     Medical History:  Past Medical History:  Diagnosis Date  . Arthritis   . Chronic back pain   . Diabetic neuropathy ([place])   . Hypercholesteremia   . Hypertension   . Liver lesion   . Neuropathy   . Type II or unspecified type diabetes mellitus without mention of complication, not stated as uncontrolled   . Vitamin D deficiency   . Wears glasses    Family history- Reviewed and unchanged Social history- Reviewed and unchanged   Review of Systems:  Review of Systems  Constitutional: Negative for malaise/fatigue and weight loss.  HENT: Negative for hearing loss and tinnitus.   Eyes: Negative for blurred vision and double vision.  Respiratory: Negative for cough, shortness of breath and wheezing.  Cardiovascular: Negative for chest pain, palpitations, orthopnea, claudication and leg swelling.  Gastrointestinal: Negative for abdominal pain, blood in stool, constipation, diarrhea, heartburn, melena, nausea and vomiting.  Genitourinary: Negative.   Musculoskeletal: Negative for joint pain and myalgias.  Skin: Negative for rash.  Neurological: Negative for dizziness, tingling, sensory change, weakness and headaches.  Endo/Heme/Allergies: Negative for polydipsia.  Psychiatric/Behavioral: Negative.   All other systems reviewed and are negative.   Physical Exam: BP 116/76   Pulse (!) 59   Temp (!) 97.4 F (36.3 C)   Ht 5' 3.5" (1.613 m)   Wt 117 lb (53.1 kg)   SpO2 95%   BMI 20.40 kg/m  Wt Readings from Last 3 Encounters:  04/05/18 117 lb (53.1 kg)  02/08/18 121 lb (54.9 kg)  01/18/18 118 lb (53.5 kg)   General Appearance: Well nourished, in no apparent distress. Eyes: PERRLA, EOMs, conjunctiva no  swelling or erythema Sinuses: No Frontal/maxillary tenderness ENT/Mouth: Ext aud canals clear, TMs without erythema, bulging. No erythema, swelling, or exudate on post pharynx.  Tonsils not swollen or erythematous. Hearing normal.  Neck: Supple, thyroid normal.  Respiratory: Respiratory effort normal, BS equal bilaterally without rales, rhonchi, wheezing or stridor.  Cardio: RRR with no MRGs. Brisk peripheral pulses without edema.  Abdomen: Soft, + BS.  Non tender, no guarding, rebound, hernias, masses. Lymphatics: Non tender without lymphadenopathy.  Musculoskeletal: Full ROM, 5/5 strength, Normal gait Skin: Warm, dry without rashes, lesions, ecchymosis.  Neuro: Cranial nerves intact. No cerebellar symptoms.  Psych: Awake and oriented X 3, normal affect, Insight and Judgment appropriate. Memory/comprehension somewhat poor.   Vicie Mutters, PA-C 11:17 AM [facility]

## 2018-04-05 ENCOUNTER — Ambulatory Visit: Payer: Medicare Other | Admitting: Physician Assistant

## 2018-04-05 ENCOUNTER — Other Ambulatory Visit: Payer: Self-pay | Admitting: Physician Assistant

## 2018-04-05 ENCOUNTER — Encounter: Payer: Self-pay | Admitting: Physician Assistant

## 2018-04-05 VITALS — BP 116/76 | HR 59 | Temp 97.4°F | Ht 63.5 in | Wt 117.0 lb

## 2018-04-05 DIAGNOSIS — N183 Chronic kidney disease, stage 3 unspecified: Secondary | ICD-10-CM

## 2018-04-05 DIAGNOSIS — E1140 Type 2 diabetes mellitus with diabetic neuropathy, unspecified: Secondary | ICD-10-CM | POA: Diagnosis not present

## 2018-04-05 DIAGNOSIS — Z79899 Other long term (current) drug therapy: Secondary | ICD-10-CM

## 2018-04-05 DIAGNOSIS — E118 Type 2 diabetes mellitus with unspecified complications: Secondary | ICD-10-CM

## 2018-04-05 DIAGNOSIS — E782 Mixed hyperlipidemia: Secondary | ICD-10-CM

## 2018-04-05 DIAGNOSIS — I700 Atherosclerosis of aorta: Secondary | ICD-10-CM

## 2018-04-05 DIAGNOSIS — E11319 Type 2 diabetes mellitus with unspecified diabetic retinopathy without macular edema: Secondary | ICD-10-CM

## 2018-04-05 DIAGNOSIS — I10 Essential (primary) hypertension: Secondary | ICD-10-CM | POA: Diagnosis not present

## 2018-04-05 DIAGNOSIS — E1122 Type 2 diabetes mellitus with diabetic chronic kidney disease: Secondary | ICD-10-CM | POA: Diagnosis not present

## 2018-04-05 DIAGNOSIS — Z794 Long term (current) use of insulin: Secondary | ICD-10-CM

## 2018-04-05 NOTE — Patient Instructions (Addendum)
Chronic Care Management  This is your comprehensive care plan.  We will continue to monitor these chronic health issues and see if we are achieving our goals every 3 months or more.  You can contact the office any time with the office phone number and get to a provider via the answering service or you can use Mychart, if you are not signed up they can do so at the front office.  Here is some information that we discussed and went over below.    Continue to keep track of your food and insulin Suggest checking your sugar BEFORE dinner and bring that in with you Continue to check your sugars regularly and  BRING IN YOUR LOG next visit  INCREASE YOUR METFORMIN TO ONE PILL WITH BREAKFAST AND ONE PILL WITH DINNER  THIS CAN DECREASE YOUR SUGARS IN THE MORNING SO DECREASE YOUR LANTUS IN THE MORNING TO 14 UNITS  A LOW SUGAR IS MORE DANGEROUS THAN A HIGH SUGAR  THE LANTUS WILL AFFECT YOUR MORNING SUGAR  STOP THE FENOFIBRATE  These are the goals we discussed: Goals (1 Years of Data) as of 04/05/2018 at 11:33 AM            12/20/17   09/08/17   06/05/17   02/27/17   11/07/16     Result Component   . HEMOGLOBIN A1C < 8.0   9.61   12.11   8.41   10.81       Note edited  04/05/2018 11:29 AM by Vicie Mutters, PA-C    Continue to keep track of your food and insulin Suggest checking your sugar BEFORE dinner and bring that in with you Continue to check your sugars regularly and  BRING IN YOUR LOG next visit [place] TO 14 UNITS  A LOW SUGAR IS MORE DANGEROUS THAN A HIGH SUGAR  THE LANTUS WILL AFFECT YOUR MORNING SUGAR    . LDL CALC < 70           76    Note edited  02/08/2018 11:30 AM by Vicie Mutters, PA-C    Will recheck on the crestor/rouvastatin May be able to stop the fenofibrate if your trigs are better with your sugars.  BRING  IN ALL OF YOUR MEDICATIONS       1  Progress evaluated against: HEMOGLOBIN A1C < 7.0    Problem list Patient Active Problem List   Diagnosis Date Noted  . Enrolled in chronic care management 01/02/2018  . Type 2 diabetes mellitus with complication, with long-term current use of insulin ([place]) 02/09/2017  . Atherosclerosis of aorta ([place]) 11/06/2016  . Diabetic retinopathy ([place]) 05/18/2015  . Encounter for Medicare annual wellness exam 05/18/2015  . Medication management 08/06/2013  . Neuropathy due to type 2 diabetes mellitus ([place]) 06/04/2013  . CKD stage 3 due to type 2 diabetes mellitus ([place])   . Mixed hyperlipidemia   . Vitamin D deficiency   . Hypertension 07/29/2012  . Chronic pain 07/29/2012    Medication List Current Outpatient Medications on File Prior to Visit  Medication Sig  . ACCU-CHEK AVIVA PLUS test strip Test 3 times a day  . Apple Cider Vinegar 188 MG CAPS Take by mouth.  Marland Kitchen aspirin 81 MG tablet Take 81 mg by mouth daily.  . ASSURE COMFORT LANCETS 30G MISC Check blood sugar  3 times daily-DX-E11.22  . bisoprolol-hydrochlorothiazide (ZIAC) 5-6.25 MG tablet TAKE ONE TABLET BY MOUTH DAILY FOR FOR BLOOD PRESSURE (Patient taking differently: TAKE 1/2 TABLET BY MOUTH DAILY FOR FOR BLOOD PRESSURE)  . Blood Glucose Calibration (ACCU-CHEK AVIVA) SOLN Check blood sugar 3 times daily-DX-E11.22  . Blood Glucose Monitoring Suppl (ACCU-CHEK AVIVA) device Check blood sugar 3 times a day.  DX-E11.22  . Cholecalciferol (VITAMIN D3) 2000 UNITS TABS Take 1 capsule by mouth 2 (two) times daily.  . Cinnamon 500 MG TABS Take by mouth 3 (three) times daily.  . dorzolamide-timolol (COSOPT) 22.3-6.8 MG/ML ophthalmic solution   . EASY COMFORT INSULIN SYRINGE 30G X 1/2" 0.5 ML MISC   . Insulin Glargine (LANTUS SOLOSTAR) 100 UNIT/ML Solostar Pen INJECT 30 UNITS INTO THE SKIN AT BEDTIME OR AS DIRECTED TO CONTROL BLOOD SUGARS  . insulin lispro (HUMALOG) 100 UNIT/ML injection Inject 0.05-0.1 mLs  (5-10 Units total) 3 (three) times daily with meals into the skin. Patient takes Rx on a sliding scale basis  . latanoprost (XALATAN) 0.005 % ophthalmic solution   . LOTEMAX 0.5 % GEL   . Magnesium 500 MG CAPS Take 500 mg by mouth 2 (two) times daily.  . metFORMIN (GLUCOPHAGE) 1000 MG tablet Take 1 tablet 2 x /day  . Multiple Vitamins-Minerals (HAIR/SKIN/NAILS) TABS Take by mouth 2 (two) times daily.  Marland Kitchen rOPINIRole (REQUIP) 3 MG tablet TAKE TWO TABLETS AT BEDTIME FOR RESTLESS LEGS  . rosuvastatin (CRESTOR) 20 MG tablet Take 1 tablet (20 mg total) by mouth at bedtime.  . triamcinolone ointment (KENALOG) 0.1 % Apply 1 application topically 2 (two) times daily.  . vitamin B-12 (CYANOCOBALAMIN) 1000 MCG tablet Take 1,000 mcg by mouth daily.   No current facility-administered medications on file prior to visit.     Allergies Allergies  Allergen Reactions  . Morphine And Related Other (See Comments)    fuzzy feeling    Patient Care Team Patient Care Team: [name], MD as PCP - General (Internal Medicine) Warden Fillers, MD as Consulting Physician (Optometry) Zadie Rhine, Clent Demark, MD as Consulting Physician (Ophthalmology) Glenna Fellows, MD as Attending Physician (Neurosurgery) Melrose Nakayama, MD as Consulting Physician (Orthopedic Surgery) Milus Banister, MD as Attending Physician (Gastroenterology)   This is a list of the screening recommended for you and due dates:  Health Maintenance  Topic Date Due  . Hemoglobin A1C  06/22/2018  . Complete foot exam   12/21/2018  . Eye exam for diabetics  02/13/2019  . Tetanus Vaccine  01/03/2021  . Flu Shot  Completed  . DEXA scan (bone density measurement)  Completed  . Pneumonia vaccines  Completed     These are your follow up appointments: Future Appointments  Date Time Provider [place]  07/05/2018 10:00 AM [name], MD [facility] None

## 2018-04-07 DIAGNOSIS — M542 Cervicalgia: Secondary | ICD-10-CM | POA: Diagnosis not present

## 2018-04-09 DIAGNOSIS — H43811 Vitreous degeneration, right eye: Secondary | ICD-10-CM | POA: Diagnosis not present

## 2018-04-09 DIAGNOSIS — H353222 Exudative age-related macular degeneration, left eye, with inactive choroidal neovascularization: Secondary | ICD-10-CM | POA: Diagnosis not present

## 2018-04-09 DIAGNOSIS — H353211 Exudative age-related macular degeneration, right eye, with active choroidal neovascularization: Secondary | ICD-10-CM | POA: Diagnosis not present

## 2018-04-09 DIAGNOSIS — H35351 Cystoid macular degeneration, right eye: Secondary | ICD-10-CM | POA: Diagnosis not present

## 2018-04-09 DIAGNOSIS — E119 Type 2 diabetes mellitus without complications: Secondary | ICD-10-CM | POA: Diagnosis not present

## 2018-04-11 DIAGNOSIS — M4692 Unspecified inflammatory spondylopathy, cervical region: Secondary | ICD-10-CM | POA: Diagnosis not present

## 2018-04-16 ENCOUNTER — Other Ambulatory Visit: Payer: Self-pay | Admitting: Orthopaedic Surgery

## 2018-04-16 DIAGNOSIS — M47812 Spondylosis without myelopathy or radiculopathy, cervical region: Secondary | ICD-10-CM

## 2018-04-16 LAB — CBC WITH DIFFERENTIAL/PLATELET
Absolute Monocytes: 994 cells/uL — ABNORMAL HIGH (ref 200–950)
Basophils Absolute: 43 cells/uL (ref 0–200)
Basophils Relative: 0.4 %
Eosinophils Absolute: 346 cells/uL (ref 15–500)
Eosinophils Relative: 3.2 %
HCT: 38 % — ABNORMAL LOW (ref 38.5–45.0)
Hemoglobin: 12.7 g/dL — ABNORMAL LOW (ref 13.2–15.5)
Lymphs Abs: 3661 cells/uL (ref 850–3900)
MCH: 31.4 pg (ref 27.0–33.0)
MCHC: 33.4 g/dL (ref 32.0–36.0)
MCV: 93.8 fL (ref 80.0–100.0)
MPV: 12.3 fL (ref 7.5–12.5)
Monocytes Relative: 9.2 %
Neutro Abs: 5756 cells/uL (ref 1500–7800)
Neutrophils Relative %: 53.3 %
Platelets: 305 10*3/uL (ref 140–400)
RBC: 4.05 10*6/uL — ABNORMAL LOW (ref 4.20–5.10)
RDW: 12.1 % (ref 11.0–15.0)
Total Lymphocyte: 33.9 %
WBC: 10.8 10*3/uL (ref 3.8–10.8)

## 2018-04-16 LAB — LIPID PANEL
Cholesterol: 140 mg/dL (ref <200)
HDL: 68 mg/dL
LDL Cholesterol (Calc): 56 mg/dL (calc)
Non-HDL Cholesterol (Calc): 72 mg/dL (calc) (ref <130)
Total CHOL/HDL Ratio: 2.1 (calc) (ref <5.0)
Triglycerides: 76 mg/dL (ref <150)

## 2018-04-16 LAB — COMPLETE METABOLIC PANEL WITH GFR
AG Ratio: 1.3 (calc) (ref 1.0–2.5)
ALT: 13 U/L
AST: 19 U/L (ref 10–35)
Albumin: 3.9 g/dL (ref 3.6–5.1)
Alkaline phosphatase (APISO): 61 U/L (ref 33–130)
BUN/Creatinine Ratio: 16 (calc) (ref 6–22)
BUN: 27 mg/dL — ABNORMAL HIGH (ref 7–25)
CO2: 27 mmol/L (ref 20–32)
Calcium: 9.9 mg/dL
Chloride: 101 mmol/L (ref 98–110)
Creat: 1.71 mg/dL — ABNORMAL HIGH
Globulin: 3 g/dL (calc) (ref 1.9–3.7)
Glucose, Bld: 303 mg/dL — ABNORMAL HIGH (ref 65–99)
Potassium: 4.5 mmol/L (ref 3.5–5.3)
Sodium: 138 mmol/L (ref 135–146)
Total Bilirubin: 0.3 mg/dL (ref 0.2–1.2)
Total Protein: 6.9 g/dL (ref 6.1–8.1)

## 2018-04-16 LAB — HEMOGLOBIN A1C
Hgb A1c MFr Bld: 9.7 % of total Hgb — ABNORMAL HIGH (ref <5.7)
Mean Plasma Glucose: 232 (calc)
eAG (mmol/L): 12.8 (calc)

## 2018-04-16 LAB — TSH: TSH: 1.15 mIU/L (ref 0.40–4.50)

## 2018-05-01 ENCOUNTER — Ambulatory Visit
Admission: RE | Admit: 2018-05-01 | Discharge: 2018-05-01 | Disposition: A | Discharge status: Home / Self Care | Payer: Medicare Other | Source: Ambulatory Visit | Attending: Orthopaedic Surgery | Admitting: Orthopaedic Surgery

## 2018-05-01 DIAGNOSIS — M542 Cervicalgia: Secondary | ICD-10-CM | POA: Diagnosis not present

## 2018-05-01 DIAGNOSIS — M47812 Spondylosis without myelopathy or radiculopathy, cervical region: Secondary | ICD-10-CM

## 2018-05-01 MED ORDER — IOPAMIDOL (ISOVUE-M 300) INJECTION 61%
1.0000 mL | Freq: Once | INTRAMUSCULAR | Status: AC | PRN
  Administered 2018-05-01: 1 mL via EPIDURAL

## 2018-05-01 MED ORDER — TRIAMCINOLONE ACETONIDE 40 MG/ML IJ SUSP (RADIOLOGY)
60.0000 mg | Freq: Once | INTRAMUSCULAR | Status: AC
  Administered 2018-05-01: 60 mg via EPIDURAL

## 2018-05-01 NOTE — Discharge Instructions (Signed)

## 2018-05-07 ENCOUNTER — Telehealth: Payer: Self-pay | Admitting: Physician Assistant

## 2018-05-07 NOTE — Telephone Encounter (Signed)
Patient called requesting advise to  get Glucose level lowered. Yesterday 160, Today 291 before eating chicken noodle soup. Has not been able to get under control since she went to a dinner event. Patient states she is taking Humalog as directed w/out benefit. Please advise.

## 2018-05-09 ENCOUNTER — Ambulatory Visit: Payer: Medicare Other | Admitting: Adult Health

## 2018-05-09 ENCOUNTER — Encounter: Payer: Self-pay | Admitting: Adult Health

## 2018-05-09 VITALS — BP 150/76 | HR 58 | Temp 97.9°F | Ht 63.5 in | Wt 117.0 lb

## 2018-05-09 DIAGNOSIS — N183 Chronic kidney disease, stage 3 unspecified: Secondary | ICD-10-CM

## 2018-05-09 DIAGNOSIS — E785 Hyperlipidemia, unspecified: Secondary | ICD-10-CM

## 2018-05-09 DIAGNOSIS — Z79899 Other long term (current) drug therapy: Secondary | ICD-10-CM

## 2018-05-09 DIAGNOSIS — Z794 Long term (current) use of insulin: Secondary | ICD-10-CM

## 2018-05-09 DIAGNOSIS — E1122 Type 2 diabetes mellitus with diabetic chronic kidney disease: Secondary | ICD-10-CM

## 2018-05-09 DIAGNOSIS — Z789 Other specified health status: Secondary | ICD-10-CM

## 2018-05-09 DIAGNOSIS — E11319 Type 2 diabetes mellitus with unspecified diabetic retinopathy without macular edema: Secondary | ICD-10-CM

## 2018-05-09 DIAGNOSIS — E1169 Type 2 diabetes mellitus with other specified complication: Secondary | ICD-10-CM | POA: Diagnosis not present

## 2018-05-09 DIAGNOSIS — E1140 Type 2 diabetes mellitus with diabetic neuropathy, unspecified: Secondary | ICD-10-CM

## 2018-05-09 DIAGNOSIS — G2581 Restless legs syndrome: Secondary | ICD-10-CM

## 2018-05-09 DIAGNOSIS — I10 Essential (primary) hypertension: Secondary | ICD-10-CM

## 2018-05-09 DIAGNOSIS — E118 Type 2 diabetes mellitus with unspecified complications: Secondary | ICD-10-CM | POA: Diagnosis not present

## 2018-05-09 MED ORDER — INSULIN ISOPHANE & REGULAR (HUMAN 70-30)100 UNIT/ML KWIKPEN: PEN_INJECTOR | SUBCUTANEOUS | 11 refills | Status: DC

## 2018-05-09 MED ORDER — ROPINIROLE HCL 3 MG PO TABS: ORAL_TABLET | ORAL | 1 refills | Status: DC

## 2018-05-09 MED ORDER — CONTINUOUS BLOOD GLUC SENSOR MISC: 0 refills | Status: DC

## 2018-05-09 NOTE — Progress Notes (Signed)
Diabetes Education and Follow-Up Visit  85 y.o.female presents for diabetic education and CCM. She has Diabetes Mellitus type 2:  with diabetic nephropathy and with diabetic polyneuropathy, she is on bASA, and denies hypoglycemia , increased appetite, nausea, polydipsia, polyuria, visual disturbances, vomiting and weight loss.  She presents today with all her medications, glucometer and very inconsistently completed food/insulin/glucose log, admits to being very confused about appropriate medications to take. We reviewed all her medicaitons and goals today, and sent her home with only correct medications with updated med list and CCM goals.   Last hemoglobin A1c was: Lab Results  Component Value Date   HGBA1C 9.7 (H) 04/05/2018   HGBA1C 9.6 (H) 12/20/2017   HGBA1C 12.1 (H) 09/08/2017   Body mass index is 20.4 kg/m.  Pt is on a regimen of: Metformin 1000 mg BID, lantus 16 units, humalog 0-4 units sliding scale (taking very inconsistently, unsure if has been taking metformin)  Pt checks her sugars 2 x day  Lowest sugar was 73. No hypoglycemia awareness. Highest sugar was 394.  Glucometer: accu-check aviva  Patient does have CKD She is not on ACE/ARB due to concerns with labile BPs, hypotension, progressive CKD  Lab Results  Component Value Date   GFRNONAA 37 (L) 02/08/2018    Lab Results  Component Value Date   CREATININE 1.71 (H) 04/05/2018   BUN 27 (H) 04/05/2018   NA 138 04/05/2018   K 4.5 04/05/2018   CL 101 04/05/2018   CO2 27 04/05/2018    Lab Results  Component Value Date   MICROALBUR 1.7 06/05/2017    She is on a Statin - recently transitioned to rosuvastatin 20 mg daily She is at goal of less than 70.  Lab Results  Component Value Date   CHOL 140 04/05/2018   HDL 68 04/05/2018   LDLCALC 56 04/05/2018   TRIG 76 04/05/2018   CHOLHDL 2.1 04/05/2018   Last diabetic eye exam: 02/12/2018 - report verified to be abstracted in chart  Problem List has  Hypertension; Chronic pain; CKD stage 3 due to type 2 diabetes mellitus ([place]); Hyperlipidemia associated with type 2 diabetes mellitus ([place]); Vitamin D deficiency; Neuropathy due to type 2 diabetes mellitus ([place]); Medication management; Diabetic retinopathy ([place]); Encounter for Medicare annual wellness exam; Atherosclerosis of aorta ([place]); Type 2 diabetes mellitus with complication, with long-term current use of insulin ([place]); and Enrolled in chronic care management on their problem list.  Medications Current Outpatient Medications on File Prior to Visit  Medication Sig  . ACCU-CHEK AVIVA PLUS test strip Test 3 times a day  . aspirin 81 MG tablet Take 81 mg by mouth daily.  . ASSURE COMFORT LANCETS 30G MISC Check blood sugar 3 times daily-DX-E11.22  . bisoprolol-hydrochlorothiazide (ZIAC) 5-6.25 MG tablet TAKE ONE TABLET BY MOUTH DAILY FOR FOR BLOOD PRESSURE (Patient taking differently: TAKE 1/2 TABLET BY MOUTH DAILY FOR FOR BLOOD PRESSURE)  . Blood Glucose Calibration (ACCU-CHEK AVIVA) SOLN Check blood sugar 3 times daily-DX-E11.22  . Blood Glucose Monitoring Suppl (ACCU-CHEK AVIVA) device Check blood sugar 3 times a day.  DX-E11.22  . Cholecalciferol (VITAMIN D3) 2000 UNITS TABS Take 1 capsule by mouth 2 (two) times daily.  . Cinnamon 500 MG TABS Take by mouth 2 (two) times daily.   . dorzolamide-timolol (COSOPT) 22.3-6.8 MG/ML ophthalmic solution   . EASY COMFORT INSULIN SYRINGE 30G X 1/2" 0.5 ML MISC   . latanoprost (XALATAN) 0.005 % ophthalmic solution   . Magnesium 500 MG CAPS Take 250  mg by mouth daily.   . Multiple Vitamins-Minerals (HAIR/SKIN/NAILS) TABS Take by mouth 2 (two) times daily.  . rosuvastatin (CRESTOR) 20 MG tablet Take 1 tablet (20 mg total) by mouth at bedtime.  . senna-docusate (SENOKOT-S) 8.6-50 MG tablet Take 1 tablet by mouth daily.  . vitamin B-12 (CYANOCOBALAMIN) 1000 MCG tablet Take 1,000 mcg by mouth daily.  . vitamin C (ASCORBIC ACID) 500 MG tablet Take 500 mg by  mouth daily.  Marland Kitchen Apple Cider Vinegar 188 MG CAPS Take by mouth.  . LOTEMAX 0.5 % GEL    No current facility-administered medications on file prior to visit.     ROS- see HPI  Physical Exam: Blood pressure (!) 150/76, pulse (!) 58, temperature 97.9 F (36.6 C), height 5' 3.5" (1.613 m), weight 117 lb (53.1 kg), SpO2 99 %. Body mass index is 20.4 kg/m. General Appearance: Well nourished, in no apparent distress. Eyes: PERRLA, EOMs, conjunctiva no swelling or erythema ENT/Mouth: Ext aud canals clear, TMs without erythema, bulging. No erythema, swelling, or exudate on post pharynx.  Tonsils not swollen or erythematous. Hearing normal.  Respiratory: Respiratory effort normal, BS equal bilaterally without rales, rhonchi, wheezing or stridor.  Cardio: RRR with no MRGs. Brisk peripheral pulses without edema.  Abdomen: Soft, + BS.  Non tender, no guarding, rebound, hernias, masses. Musculoskeletal: Full ROM, 5/5 strength, normal gait.  Skin: Warm, dry without rashes, lesions, ecchymosis.  Neuro: Cranial nerves intact. Normal muscle tone, no cerebellar symptoms. Sensation intact.    Plan and Assessment:  Diabetes Education: Reviewed 'ABCs' of diabetes management (respective goals in parentheses):  A1C (<7), blood pressure (<130/80), and cholesterol (LDL <70) Eye Exam yearly and Dental Exam every 6 months. Continue to keep track of your food and insulin- new logs provided Suggest checking your sugar BEFORE breakfast & dinner and bring that in with you Continue to check your sugars regularly and  BRING IN YOUR LOG next visit STOP METFORMIN due to renal functions, lantus, humalog - too complicated for patient and not able to follow consistently START novolin 70/30 to simplify regiment- take 10 units with breakfast and 6 units with dinner  A LOW SUGAR IS MORE DANGEROUS THAN A HIGH SUGAR She is to call me with any sugars 300+ or <70, call back in 4 days, and will see her back in 1  week  Continuous glucose monitor ordered to assist with improve glucose monitoring  BP Continue medications, consider adding back ARB if BPs remain elevated and renal functions stabilize Monitor blood pressure at home; call if consistently over 130/80 Continue DASH diet.   Reminder to go to the ER if any CP, SOB, nausea, dizziness, severe HA, changes vision/speech, left arm numbness and tingling and jaw pain.  Cholesterol  STOP pravastatin, continue rosuvastatin only Check lipids and CMP at routine follow up OVs Check CMP today as potentially has been doubling up on rosuvastatin with pravastatin - kept her old pravastatin here at office and will dispose for her to avoid confusion at home  Comprehensive care plan:  Patient/caregiver was given comprehensive care plan We will continue to monitor these goals every 3 months with an office visit and every month by a telephone call  Patient can contact the office any time with the phone number and get to a provider via the answering service or they can use Mychart.   Verbal permission was received from the patient to review comprehensive care management, they understand they have the right to stop CCM services at  any time.   The patient is self managing medications at home.  Medications were reviewed with the patient today as well as adherence and potential interactions. Significantly simplified and will follow up closely to monitor for errors and adherence issues.   The patient does not need home health services at this time.    Future Appointments  Date Time Provider [place]  05/16/2018  9:30 AM Liane Comber, NP [facility] None  07/26/2018 11:15 AM [name], MD [facility] None

## 2018-05-09 NOTE — Patient Instructions (Addendum)
Check fasting sugar in the morning before breakfast - write on log  Take 10 units of new insulin (novolin 70/30) when you eat breakfast  Write down what you ate at each meal  Check your sugar before dinner and write on log  Take 6 units of new insulin (novolin 70/30) when you eat dinner  Call me Caryl Pina) with any questions, very high sugars, or any low sugars <80  Call next Monday to let me know how your sugars did over the next few days  I will see you back next week to follow up - please bring all of your medications and glucose meter supplies with you      Chronic Care Management  This is your comprehensive care plan.  We will continue to monitor these chronic health issues and see if we are achieving our goals every 3 months or more.  You can contact the office any time with the office phone number and get to a provider via the answering service or you can use Mychart, if you are not signed up they can do so at the front office.  Here is some information that we discussed and went over below.    These are the goals we discussed: Goals (1 Years of Data) as of 05/09/2018 at 12:22 PM            04/05/18   12/20/17   09/08/17   06/05/17   02/27/17     Result Component   . HEMOGLOBIN A1C < 8.0   9.7  9.61   12.11   8.41   10.81     Note edited  05/09/2018 12:21 PM by Liane Comber, NP    Continue to keep track of your food and insulin Suggest checking your sugar BEFORE breakfast & dinner and bring that in with you Continue to check your sugars regularly and  BRING IN YOUR LOG next visit STOP METFORMIN  START novolin 70/30 - take 10 units with breakfast and 6 units with dinner  A LOW SUGAR IS MORE DANGEROUS THAN A HIGH SUGAR    . LDL CALC < 70               Note edited  05/09/2018 12:22 PM by Liane Comber, NP    Take rosuvastatin daily in the evening for cholesterol        1  Progress evaluated against: HEMOGLOBIN A1C < 7.0    Problem list Patient Active  Problem List   Diagnosis Date Noted  . Enrolled in chronic care management 01/02/2018  . Type 2 diabetes mellitus with complication, with long-term current use of insulin ([place]) 02/09/2017  . Atherosclerosis of aorta ([place]) 11/06/2016  . Diabetic retinopathy ([place]) 05/18/2015  . Encounter for Medicare annual wellness exam 05/18/2015  . Medication management 08/06/2013  . Neuropathy due to type 2 diabetes mellitus ([place]) 06/04/2013  . CKD stage 3 due to type 2 diabetes mellitus ([place])   . Hyperlipidemia associated with type 2 diabetes mellitus ([place])   . Vitamin D deficiency   . Hypertension 07/29/2012  . Chronic pain 07/29/2012    Medication List Current Outpatient Medications on File Prior to Visit  Medication Sig  . ACCU-CHEK AVIVA PLUS test strip Test 3 times a day  . aspirin 81 MG tablet Take 81 mg by mouth daily.  . ASSURE COMFORT LANCETS 30G MISC Check blood sugar 3 times daily-DX-E11.22  . bisoprolol-hydrochlorothiazide (ZIAC) 5-6.25 MG tablet TAKE ONE TABLET BY MOUTH DAILY FOR  FOR BLOOD PRESSURE (Patient taking differently: TAKE 1/2 TABLET BY MOUTH DAILY FOR FOR BLOOD PRESSURE)  . Blood Glucose Calibration (ACCU-CHEK AVIVA) SOLN Check blood sugar 3 times daily-DX-E11.22  . Blood Glucose Monitoring Suppl (ACCU-CHEK AVIVA) device Check blood sugar 3 times a day.  DX-E11.22  . Cholecalciferol (VITAMIN D3) 2000 UNITS TABS Take 1 capsule by mouth 2 (two) times daily.  . Cinnamon 500 MG TABS Take by mouth 2 (two) times daily.   . dorzolamide-timolol (COSOPT) 22.3-6.8 MG/ML ophthalmic solution   . EASY COMFORT INSULIN SYRINGE 30G X 1/2" 0.5 ML MISC   . latanoprost (XALATAN) 0.005 % ophthalmic solution   . Magnesium 500 MG CAPS Take 250 mg by mouth daily.   . Multiple Vitamins-Minerals (HAIR/SKIN/NAILS) TABS Take by mouth 2 (two) times daily.  . rosuvastatin (CRESTOR) 20 MG tablet Take 1 tablet (20 mg total) by mouth at bedtime.  . senna-docusate (SENOKOT-S) 8.6-50 MG tablet Take 1 tablet  by mouth daily.  . vitamin B-12 (CYANOCOBALAMIN) 1000 MCG tablet Take 1,000 mcg by mouth daily.  . vitamin C (ASCORBIC ACID) 500 MG tablet Take 500 mg by mouth daily.  Marland Kitchen Apple Cider Vinegar 188 MG CAPS Take by mouth.  . LOTEMAX 0.5 % GEL    No current facility-administered medications on file prior to visit.     Allergies Allergies  Allergen Reactions  . Morphine And Related Other (See Comments)    fuzzy feeling    Patient Care Team Patient Care Team: [name], MD as PCP - General (Internal Medicine) Warden Fillers, MD as Consulting Physician (Optometry) Zadie Rhine, Clent Demark, MD as Consulting Physician (Ophthalmology) Glenna Fellows, MD as Attending Physician (Neurosurgery) Melrose Nakayama, MD as Consulting Physician (Orthopedic Surgery) Milus Banister, MD as Attending Physician (Gastroenterology)   This is a list of the screening recommended for you and due dates:  Health Maintenance  Topic Date Due  . Hemoglobin A1C  10/05/2018  . Complete foot exam   12/21/2018  . Eye exam for diabetics  02/13/2019  . Tetanus Vaccine  01/03/2021  . Flu Shot  Completed  . DEXA scan (bone density measurement)  Completed  . Pneumonia vaccines  Completed     These are your follow up appointments: Future Appointments  Date Time Provider [place]  07/26/2018 11:15 AM [name], MD [facility] None

## 2018-05-10 ENCOUNTER — Other Ambulatory Visit: Payer: Self-pay | Admitting: Internal Medicine

## 2018-05-10 DIAGNOSIS — Z1231 Encounter for screening mammogram for malignant neoplasm of breast: Secondary | ICD-10-CM

## 2018-05-10 DIAGNOSIS — Z794 Long term (current) use of insulin: Secondary | ICD-10-CM | POA: Diagnosis not present

## 2018-05-10 DIAGNOSIS — E119 Type 2 diabetes mellitus without complications: Secondary | ICD-10-CM | POA: Diagnosis not present

## 2018-05-10 LAB — COMPLETE METABOLIC PANEL WITH GFR
AG Ratio: 1.5 (calc) (ref 1.0–2.5)
ALT: 24 U/L (ref 6–29)
AST: 21 U/L (ref 10–35)
Albumin: 4.3 g/dL (ref 3.6–5.1)
Alkaline phosphatase (APISO): 65 U/L (ref 33–130)
BUN/Creatinine Ratio: 25 (calc) — ABNORMAL HIGH (ref 6–22)
BUN: 32 mg/dL — ABNORMAL HIGH (ref 7–25)
CO2: 30 mmol/L (ref 20–32)
Calcium: 10 mg/dL (ref 8.6–10.4)
Chloride: 100 mmol/L (ref 98–110)
Creat: 1.3 mg/dL — ABNORMAL HIGH (ref 0.60–0.88)
GFR, Est African American: 43 mL/min/{1.73_m2} — ABNORMAL LOW (ref >=60)
GFR, Est Non African American: 37 mL/min/{1.73_m2} — ABNORMAL LOW (ref >=60)
Globulin: 2.8 g/dL (calc) (ref 1.9–3.7)
Glucose, Bld: 309 mg/dL — ABNORMAL HIGH (ref 65–99)
Potassium: 4.8 mmol/L (ref 3.5–5.3)
Sodium: 137 mmol/L (ref 135–146)
Total Bilirubin: 0.5 mg/dL (ref 0.2–1.2)
Total Protein: 7.1 g/dL (ref 6.1–8.1)

## 2018-05-10 LAB — URINALYSIS, ROUTINE W REFLEX MICROSCOPIC
Bilirubin Urine: NEGATIVE
Hgb urine dipstick: NEGATIVE
Ketones, ur: NEGATIVE
Leukocytes, UA: NEGATIVE
Nitrite: NEGATIVE
Protein, ur: NEGATIVE
Specific Gravity, Urine: 1.023 (ref 1.001–1.03)
pH: <=5 (ref 5.0–8.0)

## 2018-05-10 LAB — PTH, INTACT AND CALCIUM
Calcium: 10 mg/dL (ref 8.6–10.4)
PTH: 24 pg/mL (ref 14–64)

## 2018-05-10 MED ORDER — INSULIN ISOPHANE & REGULAR (HUMAN 70-30)100 UNIT/ML KWIKPEN: PEN_INJECTOR | SUBCUTANEOUS | 11 refills | Status: DC

## 2018-05-10 MED ORDER — INSULIN GLARGINE 100 UNIT/ML SOLOSTAR PEN: PEN_INJECTOR | SUBCUTANEOUS | 0 refills | Status: DC

## 2018-05-11 ENCOUNTER — Other Ambulatory Visit: Payer: Self-pay | Admitting: Internal Medicine

## 2018-05-11 DIAGNOSIS — Z794 Long term (current) use of insulin: Secondary | ICD-10-CM

## 2018-05-11 DIAGNOSIS — E118 Type 2 diabetes mellitus with unspecified complications: Secondary | ICD-10-CM

## 2018-05-11 DIAGNOSIS — I10 Essential (primary) hypertension: Secondary | ICD-10-CM

## 2018-05-11 MED ORDER — BISOPROLOL-HYDROCHLOROTHIAZIDE 5-6.25 MG PO TABS: ORAL_TABLET | ORAL | 3 refills | Status: DC

## 2018-05-11 MED ORDER — INSULIN ISOPHANE & REGULAR (HUMAN 70-30)100 UNIT/ML KWIKPEN: PEN_INJECTOR | SUBCUTANEOUS | 3 refills | Status: DC

## 2018-05-14 NOTE — Progress Notes (Signed)
Assessment and Plan:  Type 2 diabetes mellitus with hyperglycemia, with long-term current use of insulin (HCC) Continue current insulin regimen; 10 units AM, 5 units PM Given ranges of glucose goals - 80-180 in AM, call for any values outside of this Postprandial goal - 100-325 for now. Call for any values higher than this Discussed low blood sugar is more dangerous than high, continue with close monitoring and glucose log papers provided and plan of care reviewed Hypoglycemia information provided and reviewed Will follow up in 1 month or sooner if needed and gradually titrate dose to slowly improve control due to her age  Essential hypertension Continue medications Monitor blood pressure at home; call if consistently over 140/80 Continue DASH diet.   Reminder to go to the ER if any CP, SOB, nausea, dizziness, severe HA, changes vision/speech, left arm numbness and tingling and jaw pain.   Further disposition pending results of labs. Discussed med's effects and SE's.   Over 15 minutes of exam, counseling, chart review, and critical decision making was performed.   Future Appointments  Date Time Provider [place]  06/19/2018 10:00 AM Liane Comber, NP [facility] None  07/26/2018 11:15 AM [name], MD [facility] None  08/09/2018 10:50 AM [facility] 2 [facility]    ------------------------------------------------------------------------------------------------------------------   HPI 85 y.o.female presents for 1 week follow up on T2DM after recent transition from lantus and sliding scale humulog to Novolin 70/30 BID due to poor compliance with sliding scale attributed to limited health literacy and anxiety with complex regimen; she is no longer on metformin due to renal functions. She additionally saw Dr. Buddy Duty (endocrinology) since our last visit per her preference, but today reports she doesn't plan to follow up and would prefer to manage diabetes through our  office.   She presents with glucose log for the past 3 days; AM fasting glucose ranging 150-180; she is taking 10 units novolin 70/30. Random glucose checks later in the day range from 215-323. She is taking 5 units in the evening. Overall significantly improved from previous 400-500 glucose that she was reporting. She has not had hypoglycemic episodes since last dose change.    Lab Results  Component Value Date   GFRNONAA 37 (L) 05/09/2018   Lab Results  Component Value Date   CREATININE 1.30 (H) 05/09/2018       Past Medical History:  Diagnosis Date  . Arthritis   . Chronic back pain   . Diabetic neuropathy ([place])   . Hypercholesteremia   . Hypertension   . Liver lesion   . Neuropathy   . Type II or unspecified type diabetes mellitus without mention of complication, not stated as uncontrolled   . Vitamin D deficiency   . Wears glasses      Allergies  Allergen Reactions  . Morphine And Related Other (See Comments)    fuzzy feeling    Current Outpatient Medications on File Prior to Visit  Medication Sig  . ACCU-CHEK AVIVA PLUS test strip Test 3 times a day  . Apple Cider Vinegar 188 MG CAPS Take by mouth.  Marland Kitchen aspirin 81 MG tablet Take 81 mg by mouth daily.  . ASSURE COMFORT LANCETS 30G MISC Check blood sugar 3 times daily-DX-E11.22  . bisoprolol-hydrochlorothiazide (ZIAC) 5-6.25 MG tablet Take 1/2 to 1 Tablet daily as directed for BP  . Blood Glucose Calibration (ACCU-CHEK AVIVA) SOLN Check blood sugar 3 times daily-DX-E11.22  . Blood Glucose Monitoring Suppl (ACCU-CHEK AVIVA) device Check blood sugar 3 times  a day.  DX-E11.22  . Cholecalciferol (VITAMIN D3) 2000 UNITS TABS Take 1 capsule by mouth 2 (two) times daily.  . Cinnamon 500 MG TABS Take by mouth 2 (two) times daily.   . Continuous Blood Gluc Sensor MISC Check blood sugar 3 times a day- Dx: E11.8/Z79.4  . dorzolamide-timolol (COSOPT) 22.3-6.8 MG/ML ophthalmic solution   . EASY COMFORT INSULIN SYRINGE 30G X  1/2" 0.5 ML MISC   . Insulin Glargine (LANTUS SOLOSTAR) 100 UNIT/ML Solostar Pen As Directed to Control Blood Sugar  . Insulin Isophane & Regular Human (NOVOLIN 70/30 FLEXPEN) (70-30) 100 UNIT/ML PEN Take 10 units with breakfast & take 5 units with dinner or as directed  . latanoprost (XALATAN) 0.005 % ophthalmic solution   . LOTEMAX 0.5 % GEL   . Magnesium 500 MG CAPS Take 250 mg by mouth daily.   . Multiple Vitamins-Minerals (HAIR/SKIN/NAILS) TABS Take by mouth 2 (two) times daily.  Marland Kitchen rOPINIRole (REQUIP) 3 MG tablet TAKE TWO TABLETS AT BEDTIME FOR RESTLESS LEGS  . rosuvastatin (CRESTOR) 20 MG tablet Take 1 tablet (20 mg total) by mouth at bedtime.  . senna-docusate (SENOKOT-S) 8.6-50 MG tablet Take 1 tablet by mouth daily.  . vitamin B-12 (CYANOCOBALAMIN) 1000 MCG tablet Take 1,000 mcg by mouth daily.  . vitamin C (ASCORBIC ACID) 500 MG tablet Take 500 mg by mouth daily.   No current facility-administered medications on file prior to visit.     ROS: all negative except above.   Physical Exam:  BP 138/62   Pulse 60   Temp (!) 97.5 F (36.4 C)   Ht 5' 3.5" (1.613 m)   Wt 116 lb (52.6 kg)   SpO2 99%   BMI 20.23 kg/m   General Appearance: Well nourished, in no apparent distress. Eyes: PERRLA, conjunctiva no swelling or erythema Sinuses: No Frontal/maxillary tenderness ENT/Mouth: Hearing normal.  Neck: Supple, thyroid normal.  Respiratory: Respiratory effort normal, BS equal bilaterally without rales, rhonchi, wheezing or stridor.  Cardio: RRR with no MRGs. Brisk peripheral pulses without edema.  Abdomen: Soft, + BS.  Non tender, no guarding, rebound, hernias, masses. Lymphatics: Non tender without lymphadenopathy.  Musculoskeletal: Symmetrical strength, normal gait.  Skin: Warm, dry without rashes, lesions, ecchymosis.  Neuro: Cranial nerves intact. Normal muscle tone, no cerebellar symptoms. Psych: Awake and oriented X 3, normal affect, Insight and Judgment appropriate.      Izora Ribas, NP 10:40 AM [facility]

## 2018-05-16 ENCOUNTER — Encounter: Payer: Self-pay | Admitting: Adult Health

## 2018-05-16 ENCOUNTER — Ambulatory Visit (INDEPENDENT_AMBULATORY_CARE_PROVIDER_SITE_OTHER): Payer: Medicare Other | Admitting: Adult Health

## 2018-05-16 VITALS — BP 138/62 | HR 60 | Temp 97.5°F | Ht 63.5 in | Wt 116.0 lb

## 2018-05-16 DIAGNOSIS — Z794 Long term (current) use of insulin: Secondary | ICD-10-CM | POA: Diagnosis not present

## 2018-05-16 DIAGNOSIS — Z79899 Other long term (current) drug therapy: Secondary | ICD-10-CM

## 2018-05-16 DIAGNOSIS — E1165 Type 2 diabetes mellitus with hyperglycemia: Secondary | ICD-10-CM

## 2018-05-16 DIAGNOSIS — I10 Essential (primary) hypertension: Secondary | ICD-10-CM | POA: Diagnosis not present

## 2018-05-16 DIAGNOSIS — E118 Type 2 diabetes mellitus with unspecified complications: Secondary | ICD-10-CM | POA: Diagnosis not present

## 2018-05-16 NOTE — Patient Instructions (Signed)
Normal range for morning sugar before eating: 80-180 - call if your sugar is any higher than this  Take 10 units of insulin in the morning  Goal range for sugars later in the day: 100-325 - call if any higher or lower than this  Take 5 units of insulin in the evening with dinner     Hypoglycemia Hypoglycemia is when the sugar (glucose) level in your blood is too low. Signs of low blood sugar may include:  Feeling: ? Hungry. ? Worried or nervous (anxious). ? Sweaty and clammy. ? Confused. ? Dizzy. ? Sleepy. ? Sick to your stomach (nauseous).  Having: ? A fast heartbeat. ? A headache. ? A change in your vision. ? Tingling or no feeling (numbness) around your mouth, lips, or tongue. ? Jerky movements that you cannot control (seizure).  Having trouble with: ? Moving (coordination). ? Sleeping. ? Passing out (fainting). ? Getting upset easily (irritability). Low blood sugar can happen to people who have diabetes and people who do not have diabetes. Low blood sugar can happen quickly, and it can be an emergency. Treating low blood sugar Low blood sugar is often treated by eating or drinking something sugary right away, such as:  Fruit juice, 4-6 oz (120-150 mL).  Regular soda (not diet soda), 4-6 oz (120-150 mL).  Low-fat milk, 4 oz (120 mL).  Several pieces of hard candy.  Sugar or honey, 1 Tbsp (15 mL). Treating low blood sugar if you have diabetes If you can think clearly and swallow safely, follow the 15:15 rule:  Take 15 grams of a fast-acting carb (carbohydrate). Talk with your doctor about how much you should take.  Always keep a source of fast-acting carb with you, such as: ? Sugar tablets (glucose pills). Take 3-4 pills. ? 6-8 pieces of hard candy. ? 4-6 oz (120-150 mL) of fruit juice. ? 4-6 oz (120-150 mL) of regular (not diet) soda. ? 1 Tbsp (15 mL) honey or sugar.  Check your blood sugar 15 minutes after you take the carb.  If your blood sugar is  still at or below 70 mg/dL (3.9 mmol/L), take 15 grams of a carb again.  If your blood sugar does not go above 70 mg/dL (3.9 mmol/L) after 3 tries, get help right away.  After your blood sugar goes back to normal, eat a meal or a snack within 1 hour.  Treating very low blood sugar If your blood sugar is at or below 54 mg/dL (3 mmol/L), you have very low blood sugar (severe hypoglycemia). This may also cause:  Passing out.  Jerky movements you cannot control (seizure).  Losing consciousness (coma). This is an emergency. Do not wait to see if the symptoms will go away. Get medical help right away. Call your local emergency services (911 in the [place] Do not drive yourself to the hospital. If you have very low blood sugar and you cannot eat or drink, you may need a glucagon shot (injection). A family member or friend should learn how to check your blood sugar and how to give you a glucagon shot. Ask your doctor if you need to have a glucagon shot kit at home. Follow these instructions at home: General instructions  Take over-the-counter and prescription medicines only as told by your doctor.  Stay aware of your blood sugar as told by your doctor.  Limit alcohol intake to no more than 1 drink a day for nonpregnant women and 2 drinks a day for men. One drink  equals 12 oz of beer (355 mL), 5 oz of wine (148 mL), or 1 oz of hard liquor (44 mL).  Keep all follow-up visits as told by your doctor. This is important. If you have diabetes:   Follow your diabetes care plan as told by your doctor. Make sure you: ? Know the signs of low blood sugar. ? Take your medicines as told. ? Follow your exercise and meal plan. ? Eat on time. Do not skip meals. ? Check your blood sugar as often as told by your doctor. Always check it before and after exercise. ? Follow your sick day plan when you cannot eat or drink normally. Make this plan ahead of time with your doctor.  Share your diabetes care plan  with: ? Your work or school. ? People you live with.  Check your pee (urine) for ketones: ? When you are sick. ? As told by your doctor.  Carry a card or wear jewelry that says you have diabetes. Contact a doctor if:  You have trouble keeping your blood sugar in your target range.  You have low blood sugar often. Get help right away if:  You still have symptoms after you eat or drink something sugary.  Your blood sugar is at or below 54 mg/dL (3 mmol/L).  You have jerky movements that you cannot control.  You pass out. These symptoms may be an emergency. Do not wait to see if the symptoms will go away. Get medical help right away. Call your local emergency services (911 in the [place] Do not drive yourself to the hospital. Summary  Hypoglycemia happens when the level of sugar (glucose) in your blood is too low.  Low blood sugar can happen to people who have diabetes and people who do not have diabetes. Low blood sugar can happen quickly, and it can be an emergency.  Make sure you know the signs of low blood sugar and know how to treat it.  Always keep a source of sugar (fast-acting carb) with you to treat low blood sugar. This information is not intended to replace advice given to you by your health care provider. Make sure you discuss any questions you have with your health care provider. Document Released: 07/06/2009 Document Revised: 10/03/2017 Document Reviewed: 05/15/2015 Elsevier Interactive Patient Education  2019 [employer].

## 2018-05-17 ENCOUNTER — Telehealth: Payer: Self-pay

## 2018-05-17 NOTE — Telephone Encounter (Signed)
Patient states that here blood sugar this morning was 209 and took 10 units of insulin.  Provider aware of this and patient was advised that if blood sugar prior to dinner is 325+, go ahead and take 7 units instead of 5.

## 2018-05-22 NOTE — Telephone Encounter (Signed)
Blood sugar in the morning was 323 and had taken 10 units of insulin. Just tested her blood sugar again just recently and reading was 386. Per provider patient is take an extra 10 units. Patient aware

## 2018-05-23 ENCOUNTER — Telehealth: Payer: Self-pay

## 2018-05-23 NOTE — Telephone Encounter (Signed)
BS tested before breakfast and it was 337. Took 10 units of insulin. Before she had lunch, her BS was 462, and then ate a banana sandwich. Please advise.

## 2018-05-24 IMAGING — CR DG FOOT COMPLETE 3+V*R*
3 series · 3 of 3 positions shown · non-contrast
Comparison: 10/04/2010

CLINICAL DATA: Status post fall.  Pain

EXAM:
RIGHT FOOT COMPLETE - 3+ VIEW

[foot ap]
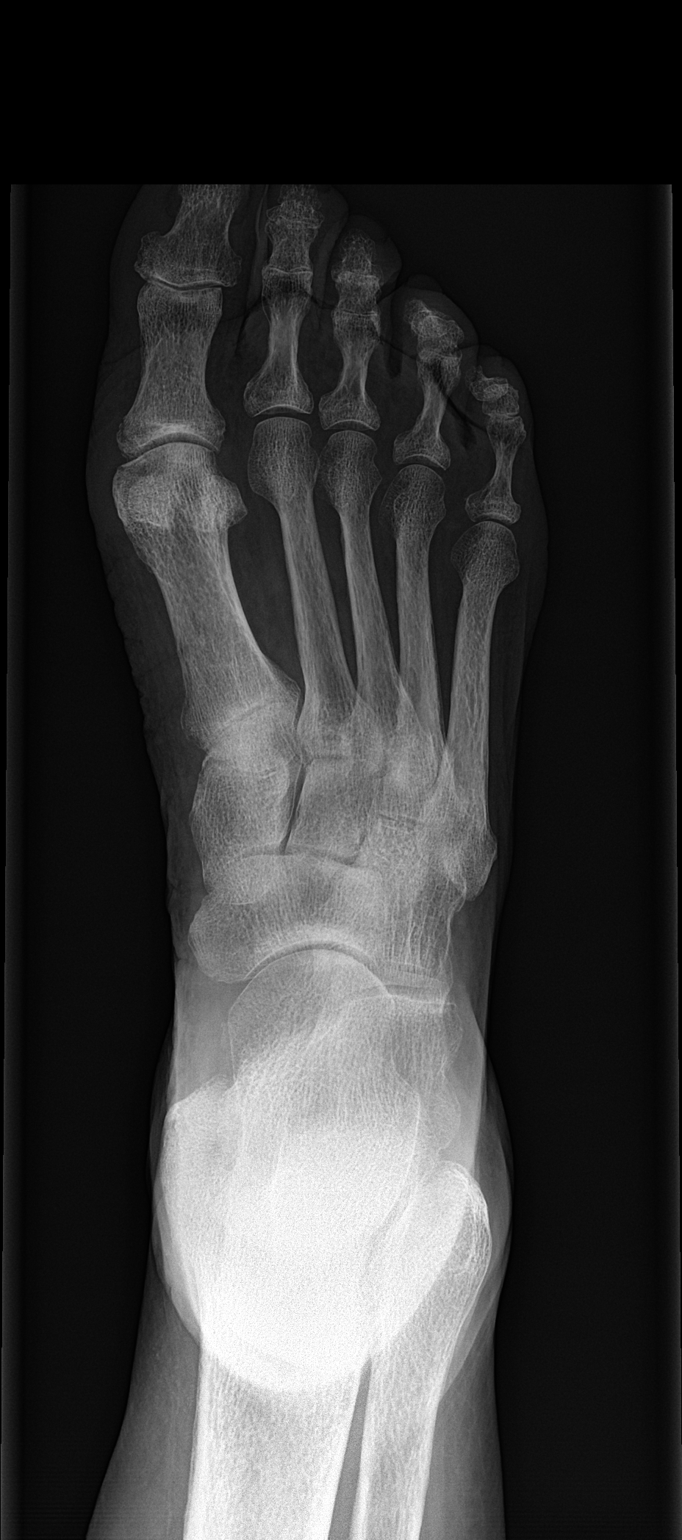

[foot obl]
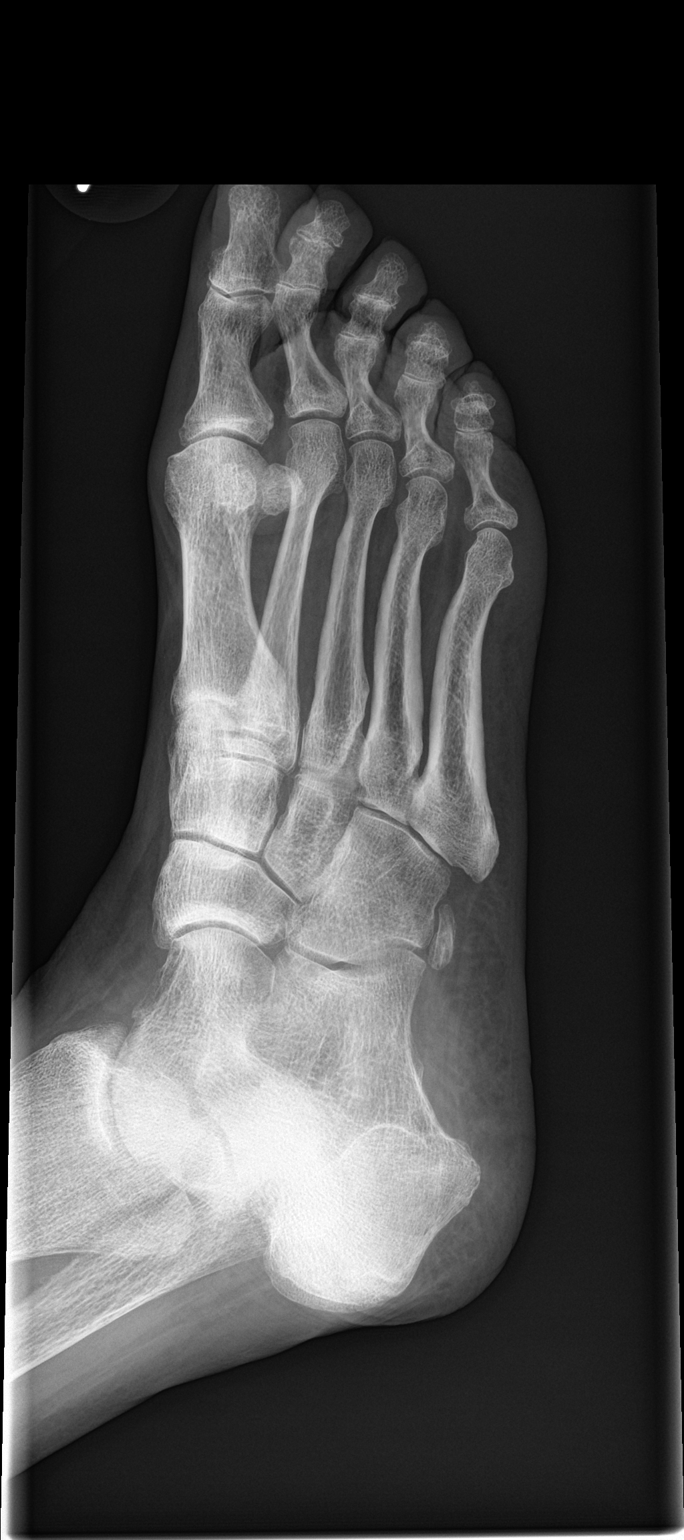

[foot lat]
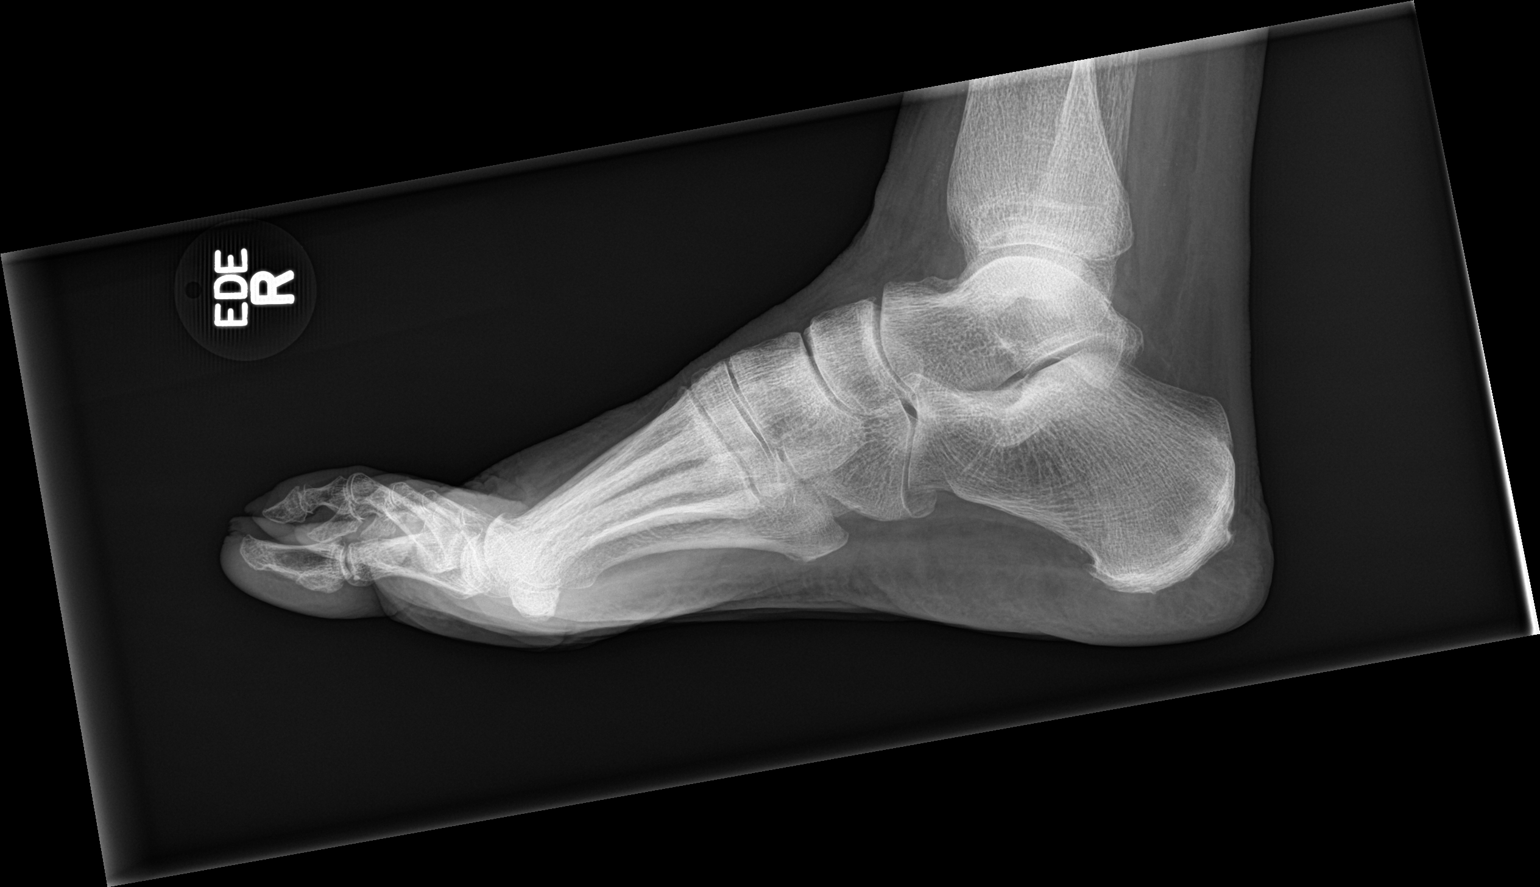

[3 of 3 positions shown; findings below may reference images not displayed]

FINDINGS: Seen only on the frontal projection radiograph is of cortical
irregularity involving the medial base of the first proximal
phalanx. Lucency appears to extend into the first MTP joint. No
dislocations identified.
IMPRESSION: 1. Acute fracture involves the base of the first proximal phalanx.

## 2018-05-24 NOTE — Telephone Encounter (Signed)
Patient states that her blood sugar at 2:16am was 280 and at 6:00am was 295. Ate breakfast and then took 10 units of insulin.

## 2018-05-26 DIAGNOSIS — L570 Actinic keratosis: Secondary | ICD-10-CM | POA: Diagnosis not present

## 2018-05-26 DIAGNOSIS — D485 Neoplasm of uncertain behavior of skin: Secondary | ICD-10-CM | POA: Diagnosis not present

## 2018-05-26 DIAGNOSIS — D489 Neoplasm of uncertain behavior, unspecified: Secondary | ICD-10-CM | POA: Diagnosis not present

## 2018-05-26 DIAGNOSIS — L821 Other seborrheic keratosis: Secondary | ICD-10-CM | POA: Diagnosis not present

## 2018-05-26 DIAGNOSIS — Z8589 Personal history of malignant neoplasm of other organs and systems: Secondary | ICD-10-CM | POA: Diagnosis not present

## 2018-05-27 ENCOUNTER — Other Ambulatory Visit: Payer: Self-pay | Admitting: Internal Medicine

## 2018-05-27 DIAGNOSIS — E1065 Type 1 diabetes mellitus with hyperglycemia: Secondary | ICD-10-CM

## 2018-05-27 DIAGNOSIS — E1029 Type 1 diabetes mellitus with other diabetic kidney complication: Secondary | ICD-10-CM

## 2018-05-27 DIAGNOSIS — IMO0002 Reserved for concepts with insufficient information to code with codable children: Secondary | ICD-10-CM

## 2018-05-27 MED ORDER — ACCU-CHEK AVIVA PLUS W/DEVICE KIT: PACK | 99 refills | Status: DC

## 2018-05-28 NOTE — Progress Notes (Signed)
Assessment and Plan:  Type 2 diabetes mellitus with hyperglycemia, with long-term current use of insulin (HCC) Increase insulin regimen; 15 units AM, 5 units with lunch, 7 units with dinner Continue calling with reports of glucose Discussed low blood sugar is more dangerous than high, continue with close monitoring and glucose log papers provided and plan of care reviewed Hypoglycemia information provided and reviewed Reviewed carbs, patient to work on identifying likely cause of severely elevated values STOP processed carbs, fruit juice, cereal Observed her techinique with glucometer today, no obvious problems Will follow up in 1 week   Essential hypertension Continue medications Monitor blood pressure at home; call if consistently over 140/80 Continue DASH diet.   Reminder to go to the ER if any CP, SOB, nausea, dizziness, severe HA, changes vision/speech, left arm numbness and tingling and jaw pain.   Further disposition pending results of labs. Discussed med's effects and SE's.   Over 15 minutes of exam, counseling, chart review, and critical decision making was performed.   Future Appointments  Date Time Provider [place]  06/07/2018 10:30 AM Liane Comber, NP [facility] None  06/19/2018 10:00 AM Liane Comber, NP [facility] None  07/26/2018 11:15 AM [name], MD [facility] None  08/09/2018 10:50 AM [facility] 2 [facility]    ------------------------------------------------------------------------------------------------------------------   HPI 85 y.o.female presents accompanied by husband for 1 week follow up on T2DM after recent transition from lantus and sliding scale humulog to Novolin 70/30 BID due to poor compliance with sliding scale attributed to limited health literacy and anxiety with complex regimen; she is no longer on metformin due to renal functions. She additionally saw Dr. Buddy Duty (endocrinology) but after visit reported she preferred to  have [place] manage in office.   She has have severely labile glucose values, fasting ranges from 120s-350; prior to meals and HS severely labile ranging from 90s to 500+. She denies any episodes of hypoglycemia during this time; she has been calling to report glucose values and has been given verbal instructions by Dr. Melford Aase on recommended dose of novolin 70/30. On review for past week, she was originally taking 10 units in AM, has been increased to 15 units. She has taken 10-20 units at dinner or split in two doses with second dose at HS if severely elevated. Most common time for severe elevations is with sugar check prior to dinner.   Reviewed meals - she does continue to report some concerning food choices - biscuit with gravy this AM, frequently having fruit cups, applesauce, cheerios with milk, oatmeal (instant), corn, cornbread. She does admits to occasional fruit juice, no soda or sweet tea.     Lab Results  Component Value Date   GFRNONAA 37 (L) 05/09/2018   Lab Results  Component Value Date   CREATININE 1.30 (H) 05/09/2018       Past Medical History:  Diagnosis Date  . Arthritis   . Chronic back pain   . Diabetic neuropathy ([place])   . Hypercholesteremia   . Hypertension   . Liver lesion   . Neuropathy   . Type II or unspecified type diabetes mellitus without mention of complication, not stated as uncontrolled   . Vitamin D deficiency   . Wears glasses      Allergies  Allergen Reactions  . Morphine And Related Other (See Comments)    fuzzy feeling    Current Outpatient Medications on File Prior to Visit  Medication Sig  . ACCU-CHEK AVIVA PLUS test strip Test 3  times a day  . Apple Cider Vinegar 188 MG CAPS Take by mouth.  Marland Kitchen aspirin 81 MG tablet Take 81 mg by mouth daily.  . ASSURE COMFORT LANCETS 30G MISC Check blood sugar 3 times daily-DX-E11.22  . bisoprolol-hydrochlorothiazide (ZIAC) 5-6.25 MG tablet Take 1/2 to 1 Tablet daily as directed for BP  . Blood Glucose  Calibration (ACCU-CHEK AVIVA) SOLN Check blood sugar 3 times daily-DX-E11.22  . Blood Glucose Monitoring Suppl (ACCU-CHEK AVIVA PLUS) w/Device KIT Check Blood Sugar 4 x /day  . Blood Glucose Monitoring Suppl (ACCU-CHEK AVIVA) device Check blood sugar 3 times a day.  DX-E11.22  . Cholecalciferol (VITAMIN D3) 2000 UNITS TABS Take 1 capsule by mouth 2 (two) times daily.  . Cinnamon 500 MG TABS Take by mouth 2 (two) times daily.   . Continuous Blood Gluc Sensor MISC Check blood sugar 3 times a day- Dx: E11.8/Z79.4  . dorzolamide-timolol (COSOPT) 22.3-6.8 MG/ML ophthalmic solution   . EASY COMFORT INSULIN SYRINGE 30G X 1/2" 0.5 ML MISC   . Insulin Glargine (LANTUS SOLOSTAR) 100 UNIT/ML Solostar Pen As Directed to Control Blood Sugar  . Insulin Isophane & Regular Human (NOVOLIN 70/30 FLEXPEN) (70-30) 100 UNIT/ML PEN Take 10 units with breakfast & take 5 units with dinner or as directed  . latanoprost (XALATAN) 0.005 % ophthalmic solution   . LOTEMAX 0.5 % GEL   . Magnesium 500 MG CAPS Take 250 mg by mouth daily.   . Multiple Vitamins-Minerals (HAIR/SKIN/NAILS) TABS Take by mouth 2 (two) times daily.  Marland Kitchen rOPINIRole (REQUIP) 3 MG tablet TAKE TWO TABLETS AT BEDTIME FOR RESTLESS LEGS  . rosuvastatin (CRESTOR) 20 MG tablet Take 1 tablet (20 mg total) by mouth at bedtime.  . senna-docusate (SENOKOT-S) 8.6-50 MG tablet Take 1 tablet by mouth daily.  . vitamin B-12 (CYANOCOBALAMIN) 1000 MCG tablet Take 1,000 mcg by mouth daily.  . vitamin C (ASCORBIC ACID) 500 MG tablet Take 500 mg by mouth daily.   No current facility-administered medications on file prior to visit.     ROS: all negative except above.   Physical Exam:  BP 122/60   Pulse (!) 54   Temp (!) 97 F (36.1 C)   Ht 5' 3.5" (1.613 m)   Wt 117 lb (53.1 kg)   PF 95 L/min   BMI 20.40 kg/m   General Appearance: Well nourished, in no apparent distress. Eyes: PERRLA, conjunctiva no swelling or erythema Sinuses: No Frontal/maxillary  tenderness ENT/Mouth: Hearing normal.  Neck: Supple, thyroid normal.  Respiratory: Respiratory effort normal, BS equal bilaterally without rales, rhonchi, wheezing or stridor.  Cardio: RRR with no MRGs. Brisk peripheral pulses without edema.  Abdomen: Soft, + BS.  Non tender, no guarding, rebound, hernias, masses. Lymphatics: Non tender without lymphadenopathy.  Musculoskeletal: Symmetrical strength, normal gait.  Skin: Warm, dry without rashes, lesions, ecchymosis.  Neuro: Cranial nerves intact. Normal muscle tone, no cerebellar symptoms. Psych: Awake and oriented X 3, normal affect, Insight and Judgment appropriate.     Izora Ribas, NP 1:45 PM [facility]

## 2018-05-29 DIAGNOSIS — M47816 Spondylosis without myelopathy or radiculopathy, lumbar region: Secondary | ICD-10-CM | POA: Diagnosis not present

## 2018-05-30 ENCOUNTER — Ambulatory Visit (INDEPENDENT_AMBULATORY_CARE_PROVIDER_SITE_OTHER): Payer: Medicare Other | Admitting: Adult Health

## 2018-05-30 ENCOUNTER — Encounter: Payer: Self-pay | Admitting: Adult Health

## 2018-05-30 VITALS — BP 122/60 | HR 54 | Temp 97.0°F | Ht 63.5 in | Wt 117.0 lb

## 2018-05-30 DIAGNOSIS — R7309 Other abnormal glucose: Secondary | ICD-10-CM

## 2018-05-30 DIAGNOSIS — Z794 Long term (current) use of insulin: Secondary | ICD-10-CM | POA: Diagnosis not present

## 2018-05-30 DIAGNOSIS — E118 Type 2 diabetes mellitus with unspecified complications: Secondary | ICD-10-CM | POA: Diagnosis not present

## 2018-05-30 NOTE — Patient Instructions (Signed)
Take 15 units insulin in the morning WITH BREAKFAST  Take 5 units with lunch  Take 7 units with dinner       Bad carbs also include fruit juice, alcohol, and sweet tea. These are empty calories that do not signal to your brain that you are full.   Please remember the good carbs are still carbs which convert into sugar. So please measure them out no more than 1/2-1 cup of rice, oatmeal, pasta, and beans  Veggies are however free foods! Pile them on.   Not all fruit is created equal. Please see the list below, the fruit at the bottom is higher in sugars than the fruit at the top. Please avoid all dried fruits.      Hypoglycemia Hypoglycemia occurs when the level of sugar (glucose) in the blood is too low. Hypoglycemia can happen in people who do or do not have diabetes. It can develop quickly, and it can be a medical emergency. For most people with diabetes, a blood glucose level below 70 mg/dL (3.9 mmol/L) is considered hypoglycemia. Glucose is a type of sugar that provides the body's main source of energy. Certain hormones (insulin and glucagon) control the level of glucose in the blood. Insulin lowers blood glucose, and glucagon raises blood glucose. Hypoglycemia can result from having too much insulin in the bloodstream, or from not eating enough food that contains glucose. You may also have reactive hypoglycemia, which happens within 4 hours after eating a meal. What are the causes? Hypoglycemia occurs most often in people who have diabetes and may be caused by:  Diabetes medicine.  Not eating enough, or not eating often enough.  Increased physical activity.  Drinking alcohol on an empty stomach. If you do not have diabetes, hypoglycemia may be caused by:  A tumor in the pancreas.  Not eating enough, or not eating for long periods at a time (fasting).  A severe infection or illness.  Certain medicines. What increases the risk? Hypoglycemia is more likely to develop  in:  People who have diabetes and take medicines to lower blood glucose.  People who abuse alcohol.  People who have a severe illness. What are the signs or symptoms? Mild symptoms Mild hypoglycemia may not cause any symptoms. If you do have symptoms, they may include:  Hunger.  Anxiety.  Sweating and feeling clammy.  Dizziness or feeling light-headed.  Sleepiness.  Nausea.  Increased heart rate.  Headache.  Blurry vision.  Irritability.  Tingling or numbness around the mouth, lips, or tongue.  A change in coordination.  Restless sleep. Moderate symptoms Moderate hypoglycemia can cause:  Mental confusion and poor judgment.  Behavior changes.  Weakness.  Irregular heartbeat. Severe symptoms Severe hypoglycemia is a medical emergency. It can cause:  Fainting.  Seizures.  Loss of consciousness (coma).  Death. How is this diagnosed? Hypoglycemia is diagnosed with a blood test to measure your blood glucose level. This blood test is done while you are having symptoms. Your health care provider may also do a physical exam and review your medical history. How is this treated? This condition can often be treated by immediately eating or drinking something that contains sugar, such as:  Fruit juice, 4-6 oz (120-150 mL).  Regular soda (not diet soda), 4-6 oz (120-150 mL).  Low-fat milk, 4 oz (120 mL).  Several pieces of hard candy.  Sugar or honey, 1 Tbsp (15 mL). Treating hypoglycemia if you have diabetes If you are alert and able to swallow safely,  follow the 15:15 rule:  Take 15 grams of a rapid-acting carbohydrate. Talk with your health care provider about how much you should take.  Rapid-acting options include: ? Glucose pills (take 15 grams). ? 6-8 pieces of hard candy. ? 4-6 oz (120-150 mL) of fruit juice. ? 4-6 oz (120-150 mL) of regular (not diet) soda. ? 1 Tbsp (15 mL) honey or sugar.  Check your blood glucose 15 minutes after you take  the carbohydrate.  If the repeat blood glucose level is still at or below 70 mg/dL (3.9 mmol/L), take 15 grams of a carbohydrate again.  If your blood glucose level does not increase above 70 mg/dL (3.9 mmol/L) after 3 tries, seek emergency medical care.  After your blood glucose level returns to normal, eat a meal or a snack within 1 hour.  Treating severe hypoglycemia Severe hypoglycemia is when your blood glucose level is at or below 54 mg/dL (3 mmol/L). Severe hypoglycemia is a medical emergency. Get medical help right away. If you have severe hypoglycemia and you cannot eat or drink, you may need an injection of glucagon. A family member or close friend should learn how to check your blood glucose and how to give you a glucagon injection. Ask your health care provider if you need to have an emergency glucagon injection kit available. Severe hypoglycemia may need to be treated in a hospital. The treatment may include getting glucose through an IV. You may also need treatment for the cause of your hypoglycemia. Follow these instructions at home:  General instructions  Take over-the-counter and prescription medicines only as told by your health care provider.  Monitor your blood glucose as told by your health care provider.  Limit alcohol intake to no more than 1 drink a day for nonpregnant women and 2 drinks a day for men. One drink equals 12 oz of beer (355 mL), 5 oz of wine (148 mL), or 1 oz of hard liquor (44 mL).  Keep all follow-up visits as told by your health care provider. This is important. If you have diabetes:  Always have a rapid-acting carbohydrate snack with you to treat low blood glucose.  Follow your diabetes management plan as directed. Make sure you: ? Know the symptoms of hypoglycemia. It is important to treat it right away to prevent it from becoming severe. ? Take your medicines as directed. ? Follow your exercise plan. ? Follow your meal plan. Eat on time, and  do not skip meals. ? Check your blood glucose as often as directed. Always check before and after exercise. ? Follow your sick day plan whenever you cannot eat or drink normally. Make this plan in advance with your health care provider.  Share your diabetes management plan with people in your workplace, school, and household.  Check your urine for ketones when you are ill and as told by your health care provider.  Carry a medical alert card or wear medical alert jewelry. Contact a health care provider if:  You have problems keeping your blood glucose in your target range.  You have frequent episodes of hypoglycemia. Get help right away if:  You continue to have hypoglycemia symptoms after eating or drinking something containing glucose.  Your blood glucose is at or below 54 mg/dL (3 mmol/L).  You have a seizure.  You faint. These symptoms may represent a serious problem that is an emergency. Do not wait to see if the symptoms will go away. Get medical help right away. Call your  local emergency services (911 in the [place] Summary  Hypoglycemia occurs when the level of sugar (glucose) in the blood is too low.  Hypoglycemia can happen in people who do or do not have diabetes. It can develop quickly, and it can be a medical emergency.  Make sure you know the symptoms of hypoglycemia and how to treat it.  Always have a rapid-acting carbohydrate snack with you to treat low blood sugar. This information is not intended to replace advice given to you by your health care provider. Make sure you discuss any questions you have with your health care provider. Document Released: 04/11/2005 Document Revised: 10/03/2017 Document Reviewed: 05/15/2015 Elsevier Interactive Patient Education  2019 [employer].

## 2018-06-04 DIAGNOSIS — H43811 Vitreous degeneration, right eye: Secondary | ICD-10-CM | POA: Diagnosis not present

## 2018-06-04 DIAGNOSIS — H353113 Nonexudative age-related macular degeneration, right eye, advanced atrophic without subfoveal involvement: Secondary | ICD-10-CM | POA: Diagnosis not present

## 2018-06-04 DIAGNOSIS — H353211 Exudative age-related macular degeneration, right eye, with active choroidal neovascularization: Secondary | ICD-10-CM | POA: Diagnosis not present

## 2018-06-04 DIAGNOSIS — H353222 Exudative age-related macular degeneration, left eye, with inactive choroidal neovascularization: Secondary | ICD-10-CM | POA: Diagnosis not present

## 2018-06-04 DIAGNOSIS — H35351 Cystoid macular degeneration, right eye: Secondary | ICD-10-CM | POA: Diagnosis not present

## 2018-06-05 ENCOUNTER — Other Ambulatory Visit: Payer: Self-pay | Admitting: Physician Assistant

## 2018-06-05 DIAGNOSIS — E1140 Type 2 diabetes mellitus with diabetic neuropathy, unspecified: Secondary | ICD-10-CM

## 2018-06-05 DIAGNOSIS — G2581 Restless legs syndrome: Secondary | ICD-10-CM

## 2018-06-06 ENCOUNTER — Ambulatory Visit: Payer: Self-pay | Admitting: Adult Health

## 2018-06-06 NOTE — Progress Notes (Signed)
Assessment and Plan:  Type 2 diabetes mellitus with hyperglycemia, with long-term current use of insulin (HCC) Overall improving without any severe hyperglycemia (400+) Continue with current insulin regimen; 15 units AM, 5 units with lunch, 7 units with dinner If skipping lunch or very light lunch - skip lunch insulin and take 10 units for larger dinner Will have her check 2 AM glucose x 3 and call to report on Monday - discussed somogyi, information provided on AVS Continue calling with reports of glucose Discussed low blood sugar is more dangerous than high, continue with close monitoring and glucose log papers provided and plan of care reviewed Hypoglycemia information provided and reviewed STOP processed carbs, fruit juice, cereal Check fructosamine Will follow up in 1 week   Essential hypertension Continue medications, recheck improved today Monitor blood pressure at home; call if consistently over 140/80 Continue DASH diet.   Reminder to go to the ER if any CP, SOB, nausea, dizziness, severe HA, changes vision/speech, left arm numbness and tingling and jaw pain.   Comprehensive care plan:  Patient/caregiver was given comprehensive care plan We will continue to monitor these goals every 3 months with an office visit and every month by a telephone call  Patient can contact the office any time with the phone number and get to a provider via the answering service or they can use Mychart.   Verbal permission was received from the patient to review comprehensive care management, they understand they have the right to stop CCM services at any time.   The patient is self managing medications at home.  Medications were reviewed with the patient today as well as adherence and potential interactions.   The patient does not need home health services at this time.    Further disposition pending results of labs. Discussed med's effects and SE's.   Over 30 minutes of exam, counseling,  chart review, and critical decision making was performed.   Future Appointments  Date Time Provider [place]  06/13/2018 11:15 AM Liane Comber, NP [facility] None  07/26/2018 11:15 AM [name], MD [facility] None  08/09/2018 10:50 AM [facility] MM 2 [facility]    ------------------------------------------------------------------------------------------------------------------   HPI BP 136/70   Pulse 60   Temp 97.7 F (36.5 C)   Ht 5' 3.5" (1.613 m)   Wt 120 lb (54.4 kg)   SpO2 99%   BMI 20.92 kg/m   85 y.o.female, enrolled in CCM, presents  for 1 week follow up on T2DM after recent transition from lantus and sliding scale humulog to Novolin 70/30 BID due to poor compliance with sliding scale attributed to limited health literacy and anxiety with complex regimen; she is no longer on metformin due to renal functions. She additionally saw Dr. Buddy Duty (endocrinology) but after visit reported she preferred to have [place] manage in office.   Her sugars are much improved this past week without any severe hyperglycemia (previous 400-600).   Fasting: ranges 122-262, with this AM unusual outlier of 63 - she did feel somewhat shaky/weak, ate breakfast and doing well since. She is taking 15 units daily.   Pre-lunch: 195-230. Takes 5 units, except 1 day didn't have lunch and skipped, took 10 units with dinner instead  Pre-dinner: 161-353; she takes 7 units with dinner, except when she skipped lunch and had large dinner took 10 units (incidentall next AM glucose was excellent at 122-)   Reviewed meals - she does continue to report some concerning food choices - still having cornbread  occasionally, apple butter with toast, though she has switched to whole grain thin-sliced bread (low carb, high fiber).   Overall she is pleased with her progress.   She denies waking up at night with cold sweats, shaking, or other hypoglycemic symptoms.   Glucose logs copied and scanned into  chart   Lab Results  Component Value Date   GFRNONAA 37 (L) 05/09/2018   Lab Results  Component Value Date   CREATININE 1.30 (H) 05/09/2018      Past Medical History:  Diagnosis Date  . Arthritis   . Chronic back pain   . Diabetic neuropathy ([place])   . Hypercholesteremia   . Hypertension   . Liver lesion   . Neuropathy   . Type II or unspecified type diabetes mellitus without mention of complication, not stated as uncontrolled   . Vitamin D deficiency   . Wears glasses      Allergies  Allergen Reactions  . Morphine And Related Other (See Comments)    fuzzy feeling    Current Outpatient Medications on File Prior to Visit  Medication Sig  . ACCU-CHEK AVIVA PLUS test strip Test 3 times a day  . Apple Cider Vinegar 188 MG CAPS Take by mouth.  Marland Kitchen aspirin 81 MG tablet Take 81 mg by mouth daily.  . ASSURE COMFORT LANCETS 30G MISC Check blood sugar 3 times daily-DX-E11.22  . bisoprolol-hydrochlorothiazide (ZIAC) 5-6.25 MG tablet Take 1/2 to 1 Tablet daily as directed for BP  . Blood Glucose Calibration (ACCU-CHEK AVIVA) SOLN Check blood sugar 3 times daily-DX-E11.22  . Blood Glucose Monitoring Suppl (ACCU-CHEK AVIVA PLUS) w/Device KIT Check Blood Sugar 4 x /day  . Blood Glucose Monitoring Suppl (ACCU-CHEK AVIVA) device Check blood sugar 3 times a day.  DX-E11.22  . Cholecalciferol (VITAMIN D3) 2000 UNITS TABS Take 1 capsule by mouth 2 (two) times daily.  . Cinnamon 500 MG TABS Take by mouth 2 (two) times daily.   . Continuous Blood Gluc Sensor MISC Check blood sugar 3 times a day- Dx: E11.8/Z79.4  . dorzolamide-timolol (COSOPT) 22.3-6.8 MG/ML ophthalmic solution   . EASY COMFORT INSULIN SYRINGE 30G X 1/2" 0.5 ML MISC   . Insulin Glargine (LANTUS SOLOSTAR) 100 UNIT/ML Solostar Pen As Directed to Control Blood Sugar  . Insulin Isophane & Regular Human (NOVOLIN 70/30 FLEXPEN) (70-30) 100 UNIT/ML PEN Take 10 units with breakfast & take 5 units with dinner or as directed  .  latanoprost (XALATAN) 0.005 % ophthalmic solution   . LOTEMAX 0.5 % GEL   . Magnesium 500 MG CAPS Take 250 mg by mouth daily.   . Multiple Vitamins-Minerals (HAIR/SKIN/NAILS) TABS Take by mouth 2 (two) times daily.  Marland Kitchen rOPINIRole (REQUIP) 3 MG tablet TAKE 2 TABLETS BY MOUTH AT BEDTIME FOR RESTLESSNESS LEGS  . rosuvastatin (CRESTOR) 20 MG tablet Take 1 tablet (20 mg total) by mouth at bedtime.  . senna-docusate (SENOKOT-S) 8.6-50 MG tablet Take 1 tablet by mouth daily.  . vitamin B-12 (CYANOCOBALAMIN) 1000 MCG tablet Take 1,000 mcg by mouth daily.  . vitamin C (ASCORBIC ACID) 500 MG tablet Take 500 mg by mouth daily.   No current facility-administered medications on file prior to visit.     ROS: all negative except above.   Physical Exam:  BP 136/70   Pulse 60   Temp 97.7 F (36.5 C)   Ht 5' 3.5" (1.613 m)   Wt 120 lb (54.4 kg)   SpO2 99%   BMI 20.92 kg/m   General Appearance:  Well nourished, in no apparent distress. Eyes: PERRLA, conjunctiva no swelling or erythema Sinuses: No Frontal/maxillary tenderness ENT/Mouth: Hearing normal.  Neck: Supple, thyroid normal.  Respiratory: Respiratory effort normal, BS equal bilaterally without rales, rhonchi, wheezing or stridor.  Cardio: RRR with no MRGs. Brisk peripheral pulses without edema.  Abdomen: Soft, + BS.  Non tender, no guarding, rebound, hernias, masses. Lymphatics: Non tender without lymphadenopathy.  Musculoskeletal: Symmetrical strength, normal gait.  Skin: Warm, dry without rashes, lesions, ecchymosis.  Neuro: Cranial nerves intact. Normal muscle tone, no cerebellar symptoms. Psych: Awake and oriented X 3, normal affect, Insight and Judgment appropriate.     Izora Ribas, NP 12:19 PM [facility]

## 2018-06-07 ENCOUNTER — Encounter: Payer: Self-pay | Admitting: Adult Health

## 2018-06-07 ENCOUNTER — Ambulatory Visit (INDEPENDENT_AMBULATORY_CARE_PROVIDER_SITE_OTHER): Payer: Medicare Other | Admitting: Adult Health

## 2018-06-07 VITALS — BP 136/70 | HR 60 | Temp 97.7°F | Ht 63.5 in | Wt 120.0 lb

## 2018-06-07 DIAGNOSIS — Z794 Long term (current) use of insulin: Secondary | ICD-10-CM | POA: Diagnosis not present

## 2018-06-07 DIAGNOSIS — Z79899 Other long term (current) drug therapy: Secondary | ICD-10-CM | POA: Diagnosis not present

## 2018-06-07 DIAGNOSIS — I10 Essential (primary) hypertension: Secondary | ICD-10-CM | POA: Diagnosis not present

## 2018-06-07 DIAGNOSIS — E118 Type 2 diabetes mellitus with unspecified complications: Secondary | ICD-10-CM

## 2018-06-07 NOTE — Patient Instructions (Addendum)
Chronic Care Management  This is your comprehensive care plan.  We will continue to monitor these chronic health issues and see if we are achieving our goals every 3 months or more.  You can contact the office any time with the office phone number and get to a provider via the answering service or you can use Mychart, if you are not signed up they can do so at the front office.  Here is some information that we discussed and went over below.    These are the goals we discussed: Goals (1 Years of Data) as of 06/07/2018 at 12:51 PM            04/05/18   12/20/17   09/08/17   06/05/17   11/07/16     Result Component   . HEMOGLOBIN A1C < 8.0   9.7  9.61   12.11   8.41       Note edited  06/07/2018 12:50 PM by Liane Comber, NP    Continue to keep track of your food and insulin Suggest checking your sugar BEFORE breakfast, luch & dinner and bring that in with you Continue to check your sugars regularly and  BRING IN YOUR LOG    Novolin 70/30 - take 15 units with breakfast, 5 units with lunch and 7 units with dinner  Check 2 AM glucose x 3 days  A LOW SUGAR IS MORE DANGEROUS THAN A HIGH SUGAR    . LDL CALC < 70           76    Note edited  05/09/2018 12:22 PM by Liane Comber, NP    Take rosuvastatin daily in the evening for cholesterol        1  Progress evaluated against: HEMOGLOBIN A1C < 7.0    Problem list Patient Active Problem List   Diagnosis Date Noted  . Enrolled in chronic care management 01/02/2018  . Type 2 diabetes mellitus with complication, with long-term current use of insulin ([place]) 02/09/2017  . Atherosclerosis of aorta ([place]) 11/06/2016  . Diabetic retinopathy ([place]) 05/18/2015  . Encounter for Medicare annual wellness exam 05/18/2015  . Medication management 08/06/2013  . Neuropathy due to type 2 diabetes mellitus ([place]) 06/04/2013  . CKD stage 3 due to type 2 diabetes mellitus ([place])   . Hyperlipidemia associated with type 2 diabetes mellitus ([place])   .  Vitamin D deficiency   . Hypertension 07/29/2012  . Chronic pain 07/29/2012    Medication List Current Outpatient Medications on File Prior to Visit  Medication Sig  . ACCU-CHEK AVIVA PLUS test strip Test 3 times a day  . Apple Cider Vinegar 188 MG CAPS Take by mouth.  Marland Kitchen aspirin 81 MG tablet Take 81 mg by mouth daily.  . ASSURE COMFORT LANCETS 30G MISC Check blood sugar 3 times daily-DX-E11.22  . bisoprolol-hydrochlorothiazide (ZIAC) 5-6.25 MG tablet Take 1/2 to 1 Tablet daily as directed for BP  . Blood Glucose Calibration (ACCU-CHEK AVIVA) SOLN Check blood sugar 3 times daily-DX-E11.22  . Blood Glucose Monitoring Suppl (ACCU-CHEK AVIVA PLUS) w/Device KIT Check Blood Sugar 4 x /day  . Blood Glucose Monitoring Suppl (ACCU-CHEK AVIVA) device Check blood sugar 3 times a day.  [id number]  . Cholecalciferol (VITAMIN D3) 2000 UNITS TABS Take 1 capsule by mouth 2 (two) times daily.  . Cinnamon 500 MG TABS Take by mouth 2 (two) times daily.   . Continuous Blood Gluc Sensor MISC Check blood sugar 3 times a day- Dx: E11.8/Z79.4  .  dorzolamide-timolol (COSOPT) 22.3-6.8 MG/ML ophthalmic solution   . EASY COMFORT INSULIN SYRINGE 30G X 1/2" 0.5 ML MISC   . Insulin Glargine (LANTUS SOLOSTAR) 100 UNIT/ML Solostar Pen As Directed to Control Blood Sugar  . Insulin Isophane & Regular Human (NOVOLIN 70/30 FLEXPEN) (70-30) 100 UNIT/ML PEN Take 10 units with breakfast & take 5 units with dinner or as directed  . latanoprost (XALATAN) 0.005 % ophthalmic solution   . LOTEMAX 0.5 % GEL   . Magnesium 500 MG CAPS Take 250 mg by mouth daily.   . Multiple Vitamins-Minerals (HAIR/SKIN/NAILS) TABS Take by mouth 2 (two) times daily.  Marland Kitchen rOPINIRole (REQUIP) 3 MG tablet TAKE 2 TABLETS BY MOUTH AT BEDTIME FOR RESTLESSNESS LEGS  . rosuvastatin (CRESTOR) 20 MG tablet Take 1 tablet (20 mg total) by mouth at bedtime.  . senna-docusate (SENOKOT-S) 8.6-50 MG tablet Take 1 tablet by mouth daily.  . vitamin B-12 (CYANOCOBALAMIN)  1000 MCG tablet Take 1,000 mcg by mouth daily.  . vitamin C (ASCORBIC ACID) 500 MG tablet Take 500 mg by mouth daily.   No current facility-administered medications on file prior to visit.     Allergies Allergies  Allergen Reactions  . Morphine And Related Other (See Comments)    fuzzy feeling    Patient Care Team Patient Care Team: [name], MD as PCP - General (Internal Medicine) Warden Fillers, MD as Consulting Physician (Optometry) Zadie Rhine, Clent Demark, MD as Consulting Physician (Ophthalmology) Glenna Fellows, MD as Attending Physician (Neurosurgery) Melrose Nakayama, MD as Consulting Physician (Orthopedic Surgery) Milus Banister, MD as Attending Physician (Gastroenterology)   This is a list of the screening recommended for you and due dates:  Health Maintenance  Topic Date Due  . Hemoglobin A1C  10/05/2018  . Complete foot exam   12/21/2018  . Eye exam for diabetics  02/13/2019  . Tetanus Vaccine  01/03/2021  . Flu Shot  Completed  . DEXA scan (bone density measurement)  Completed  . Pneumonia vaccines  Completed     These are your follow up appointments: Future Appointments  Date Time Provider [place]  06/13/2018 11:15 AM Liane Comber, NP [facility] None  07/26/2018 11:15 AM [name], MD [facility] None  08/09/2018 10:50 AM [facility] MM 2 [facility]      Somogyi effect The brain needs two things: oxygen and sugar. If the blood sugar level drops too low in the early morning hours, hormones (such as growth hormone, cortisol, and catecholamines) are released to make sure you brain can still function. These help reverse the low blood sugar level but may lead to blood sugar levels that are higher than normal in the morning. This is common for patient that take insulin at night or do not ear regular snacks.  Please schedule to get up in the middle of the night to check your blood sugar.   This may be happening to you. Please check your  sugar at 2 am when you wake up for a few days this week and call me the next day after you check your morning sugar and let me know what the numbers were.     Hypoglycemia Hypoglycemia is when the sugar (glucose) level in your blood is too low. Signs of low blood sugar may include:  Feeling: ? Hungry. ? Worried or nervous (anxious). ? Sweaty and clammy. ? Confused. ? Dizzy. ? Sleepy. ? Sick to your stomach (nauseous).  Having: ? A fast heartbeat. ? A headache. ? A change in  your vision. ? Tingling or no feeling (numbness) around your mouth, lips, or tongue. ? Jerky movements that you cannot control (seizure).  Having trouble with: ? Moving (coordination). ? Sleeping. ? Passing out (fainting). ? Getting upset easily (irritability). Low blood sugar can happen to people who have diabetes and people who do not have diabetes. Low blood sugar can happen quickly, and it can be an emergency. Treating low blood sugar Low blood sugar is often treated by eating or drinking something sugary right away, such as:  Fruit juice, 4-6 oz (120-150 mL).  Regular soda (not diet soda), 4-6 oz (120-150 mL).  Low-fat milk, 4 oz (120 mL).  Several pieces of hard candy.  Sugar or honey, 1 Tbsp (15 mL). Treating low blood sugar if you have diabetes If you can think clearly and swallow safely, follow the 15:15 rule:  Take 15 grams of a fast-acting carb (carbohydrate). Talk with your doctor about how much you should take.  Always keep a source of fast-acting carb with you, such as: ? Sugar tablets (glucose pills). Take 3-4 pills. ? 6-8 pieces of hard candy. ? 4-6 oz (120-150 mL) of fruit juice. ? 4-6 oz (120-150 mL) of regular (not diet) soda. ? 1 Tbsp (15 mL) honey or sugar.  Check your blood sugar 15 minutes after you take the carb.  If your blood sugar is still at or below 70 mg/dL (3.9 mmol/L), take 15 grams of a carb again.  If your blood sugar does not go above 70 mg/dL (3.9 mmol/L)  after 3 tries, get help right away.  After your blood sugar goes back to normal, eat a meal or a snack within 1 hour.  Treating very low blood sugar If your blood sugar is at or below 54 mg/dL (3 mmol/L), you have very low blood sugar (severe hypoglycemia). This may also cause:  Passing out.  Jerky movements you cannot control (seizure).  Losing consciousness (coma). This is an emergency. Do not wait to see if the symptoms will go away. Get medical help right away. Call your local emergency services (911 in the [place] Do not drive yourself to the hospital. If you have very low blood sugar and you cannot eat or drink, you may need a glucagon shot (injection). A family member or friend should learn how to check your blood sugar and how to give you a glucagon shot. Ask your doctor if you need to have a glucagon shot kit at home. Follow these instructions at home: General instructions  Take over-the-counter and prescription medicines only as told by your doctor.  Stay aware of your blood sugar as told by your doctor.  Limit alcohol intake to no more than 1 drink a day for nonpregnant women and 2 drinks a day for men. One drink equals 12 oz of beer (355 mL), 5 oz of wine (148 mL), or 1 oz of hard liquor (44 mL).  Keep all follow-up visits as told by your doctor. This is important. If you have diabetes:   Follow your diabetes care plan as told by your doctor. Make sure you: ? Know the signs of low blood sugar. ? Take your medicines as told. ? Follow your exercise and meal plan. ? Eat on time. Do not skip meals. ? Check your blood sugar as often as told by your doctor. Always check it before and after exercise. ? Follow your sick day plan when you cannot eat or drink normally. Make this plan ahead of time  with your doctor.  Share your diabetes care plan with: ? Your work or school. ? People you live with.  Check your pee (urine) for ketones: ? When you are sick. ? As told by your  doctor.  Carry a card or wear jewelry that says you have diabetes. Contact a doctor if:  You have trouble keeping your blood sugar in your target range.  You have low blood sugar often. Get help right away if:  You still have symptoms after you eat or drink something sugary.  Your blood sugar is at or below 54 mg/dL (3 mmol/L).  You have jerky movements that you cannot control.  You pass out. These symptoms may be an emergency. Do not wait to see if the symptoms will go away. Get medical help right away. Call your local emergency services (911 in the [place] Do not drive yourself to the hospital. Summary  Hypoglycemia happens when the level of sugar (glucose) in your blood is too low.  Low blood sugar can happen to people who have diabetes and people who do not have diabetes. Low blood sugar can happen quickly, and it can be an emergency.  Make sure you know the signs of low blood sugar and know how to treat it.  Always keep a source of sugar (fast-acting carb) with you to treat low blood sugar. This information is not intended to replace advice given to you by your health care provider. Make sure you discuss any questions you have with your health care provider. Document Released: 07/06/2009 Document Revised: 10/03/2017 Document Reviewed: 05/15/2015 Elsevier Interactive Patient Education  2019 [employer] Inc[employer]      Preventing Hypoglycemia Hypoglycemia occurs when the level of sugar (glucose) in the blood is too low. Hypoglycemia can happen in people who do or do not have diabetes (diabetes mellitus). It can develop quickly, and it can be a medical emergency. For most people with diabetes, a blood glucose level below 70 mg/dL (3.9 mmol/L) is considered hypoglycemia. Glucose is a type of sugar that provides the body's main source of energy. Certain hormones (insulin and glucagon) control the level of glucose in the blood. Insulin lowers blood glucose, and glucagon increases blood  glucose. Hypoglycemia can result from having too much insulin in the bloodstream, or from not eating enough food that contains glucose. Your risk for hypoglycemia is higher:  If you take insulin or diabetes medicines to help lower your blood glucose or help your body make more insulin.  If you skip or delay a meal or snack.  If you are ill.  During and after exercise. You can prevent hypoglycemia by working with your health care provider to adjust your meal plan as needed and by taking other precautions. How can hypoglycemia affect me? Mild symptoms Mild hypoglycemia may not cause any symptoms. If you do have symptoms, they may include:  Hunger.  Anxiety.  Sweating and feeling clammy.  Dizziness or feeling light-headed.  Sleepiness.  Nausea.  Increased heart rate.  Headache.  Blurry vision.  Irritability.  Tingling or numbness around the mouth, lips, or tongue.  A change in coordination.  Restless sleep. If mild hypoglycemia is not recognized and treated, it can quickly become moderate or severe hypoglycemia. Moderate symptoms Moderate hypoglycemia can cause:  Mental confusion and poor judgment.  Behavior changes.  Weakness.  Irregular heartbeat. Severe symptoms Severe hypoglycemia is a medical emergency. It can cause:  Fainting.  Seizures.  Loss of consciousness (coma).  Death. What nutrition changes can  be made?  Work with your health care provider or diet and nutrition specialist (dietitian) to make a healthy meal plan that is right for you. Follow your meal plan carefully.  Eat meals at regular times.  If recommended by your health care provider, have snacks between meals.  Donot skip or delay meals or snacks. You can be at risk for hypoglycemia if you are not getting enough carbohydrates. What lifestyle changes can be made?   Work closely with your health care provider to manage your blood glucose. Make sure you know: ? Your goal blood  glucose levels. ? How and when to check your blood glucose. ? The symptoms of hypoglycemia. It is important to treat it right away to keep it from becoming severe.  Do not drink alcohol on an empty stomach.  When you are ill, check your blood glucose more often than usual. Follow your sick day plan whenever you cannot eat or drink normally. Make this plan in advance with your health care provider.  Always check your blood glucose before, during, and after exercise. How is this treated? This condition can often be treated by immediately eating or drinking something that contains sugar, such as:  Fruit juice, 4-6 oz (120-150 mL).  Regular (not diet) soda, 4-6 oz (120-150 mL).  Low-fat milk, 4 oz (120 mL).  Several pieces of hard candy.  Sugar or honey, 1 Tbsp (15 mL). Treating hypoglycemia if you have diabetes If you are alert and able to swallow safely, follow the 15:15 rule:  Take 15 grams of a rapid-acting carbohydrate. Talk with your health care provider about how much you should take.  Rapid-acting options include: ? Glucose pills (take 15 grams). ? 6-8 pieces of hard candy. ? 4-6 oz (120-150 mL) of fruit juice. ? 4-6 oz (120-150 mL) of regular (not diet) soda.  Check your blood glucose 15 minutes after you take the carbohydrate.  If the repeat blood glucose level is still at or below 70 mg/dL (3.9 mmol/L), take 15 grams of a carbohydrate again.  If your blood glucose level does not increase above 70 mg/dL (3.9 mmol/L) after 3 tries, seek emergency medical care.  After your blood glucose level returns to normal, eat a meal or a snack within 1 hour. Treating severe hypoglycemia Severe hypoglycemia is when your blood glucose level is at or below 54 mg/dL (3 mmol/L). Severe hypoglycemia is a medical emergency. Get medical help right away. If you have severe hypoglycemia and you cannot eat or drink, you may need an injection of glucagon. A family member or close friend should  learn how to check your blood glucose and how to give you a glucagon injection. Ask your health care provider if you need to have an emergency glucagon injection kit available. Severe hypoglycemia may need to be treated in a hospital. The treatment may include getting glucose through an IV. You may also need treatment for the cause of your hypoglycemia. Where to find more information  American Diabetes Association: www.diabetes.[employer] of Diabetes and Digestive and Kidney Diseases: [employer] Contact a health care provider if:  You have problems keeping your blood glucose in your target range.  You have frequent episodes of hypoglycemia. Get help right away if:  You continue to have hypoglycemia symptoms after eating or drinking something containing glucose.  Your blood glucose level is at or below 54 mg/dL (3 mmol/L).  You faint.  You have a seizure. These symptoms may represent a serious problem  that is an emergency. Do not wait to see if the symptoms will go away. Get medical help right away. Call your local emergency services (911 in the [place] Summary  Know the symptoms of hypoglycemia, and when you are at risk for it (such as during exercise or when you are sick). Check your blood glucose often when you are at risk for hypoglycemia.  Hypoglycemia can develop quickly, and it can be dangerous if it is not treated right away. If you have a history of severe hypoglycemia, make sure you know how to use your glucagon injection kit.  Make sure you know how to treat hypoglycemia. Keep a carbohydrate snack available when you may be at risk for hypoglycemia. This information is not intended to replace advice given to you by your health care provider. Make sure you discuss any questions you have with your health care provider. Document Released: 12/07/2016 Document Revised: 10/02/2017 Document Reviewed: 12/07/2016 Elsevier Interactive Patient Education  2019 [employer].

## 2018-06-12 LAB — FRUCTOSAMINE: Fructosamine: 451 umol/L — ABNORMAL HIGH (ref 205–285)

## 2018-06-12 NOTE — Progress Notes (Signed)
Assessment and Plan:  Type 2 diabetes mellitus with hyperglycemia, with long-term current use of insulin (HCC) Some elevated glucose values this past week following valentine's  Fructosamine 451 - need to continue increasing insulin though will proceed slowly in consideration of her age, historically brittle/labile glucose values, very limited health literacy  Insulin regimen Breakfast: 15 units with breakfast Lunch: take 5 units if glucose is less than 200, take 7 units if 200-250, Take 10 units if 300+ Dinner: take 7 units if <200, Take 10 units if 200-300, Take 12 units if 300+ - be careful with this - remember low blood sugar is more dangerous than high  Continue calling with reports of glucose if unusually low or high  Discussed low blood sugar is more dangerous than high, continue with close monitoring and glucose log papers provided and plan of care reviewed Hypoglycemia information provided and reviewed STOP processed carbs, fruit juice, cereal  Will follow up in 10-14 days or sooner if needed  Essential hypertension Continue medications, recheck improved today Monitor blood pressure at home; call if consistently over 140/80 Continue DASH diet.   Reminder to go to the ER if any CP, SOB, nausea, dizziness, severe HA, changes vision/speech, left arm numbness and tingling and jaw pain.  Further disposition pending results of labs. Discussed med's effects and SE's.   Over 30 minutes of exam, counseling, chart review, and critical decision making was performed.   Future Appointments  Date Time Provider [place]  06/13/2018 11:15 AM Kelsey Comber, NP [facility] None  07/26/2018 11:15 AM Kelsey Pinto, MD [facility] None  08/09/2018 10:50 AM [facility]    ------------------------------------------------------------------------------------------------------------------   HPI BP 136/60   Pulse (!) 54   Temp (!) 97.2 F (36.2 C)   Ht 5' 3.5"  (1.613 m)   Wt 119 lb 9.6 oz (54.3 kg)   SpO2 99%   BMI 20.85 kg/m   85 y.o.female, enrolled in CCM, presents  for 1 week follow up on T2DM after recent transition from lantus and sliding scale humulog to Novolin 70/30 BID due to poor compliance with sliding scale attributed to limited health literacy and anxiety with complex regimen; she is no longer on metformin due to renal functions. She additionally saw Dr. Buddy Dennis (endocrinology) but after visit reported she preferred to have [place] manage in office.   Last week we asked to her check 2 AM glucose for 3 days to r/o [place] effect - she presented with log demonstrating values ranging 180-250, similar to following AM glucose.   Her sugars are worsening again this past week with several values 400+   For dates 2/13-2/19  Fasting: ranges 122-234, with this AM highest - no episodes of hypoglycemia this past week.  She is taking 15 units daily with breakfast.    Pre-lunch: 205-396. Takes 5 units, except 1 day for glucose of 396 took 10 units instead  Pre-dinner: 247-439 (significantly higher than previous);  she takes 7 units with dinner for glucose up to 350, taking 10 units otherwise   Component     Latest Ref Rng & Units 06/07/2018  Fructosamine     205 - 285 umol/L 451 (H)    Reviewed meals - she does continue to report some concerning food choices - still having cornbread occasionally, apple butter with toast, though she has switched to whole grain thin-sliced bread (low carb, high fiber).   Today she is frustrated her progress, discussed improvement from previous 500+ glucose Will  start increasing insulin further with simple sliding scale while reminding her that low glucose is more dangerous than high, particularly considering her age. Will continue close follow up.   She denies waking up at night with cold sweats, shaking, or other hypoglycemic symptoms.   Glucose logs copied and scanned into chart   Lab Results  Component Value  Date   GFRNONAA 37 (L) 05/09/2018   Lab Results  Component Value Date   CREATININE 1.30 (H) 05/09/2018   A1C goal is <8% due to her age Lab Results  Component Value Date   HGBA1C 9.7 (H) 04/05/2018   BMI is Body mass index is 20.85 kg/m. Wt Readings from Last 3 Encounters:  06/13/18 119 lb 9.6 oz (54.3 kg)  06/07/18 120 lb (54.4 kg)  05/30/18 117 lb (53.1 kg)    Past Medical History:  Diagnosis Date  . Arthritis   . Chronic back pain   . Diabetic neuropathy ([place])   . Hypercholesteremia   . Hypertension   . Liver lesion   . Neuropathy   . Type II or unspecified type diabetes mellitus without mention of complication, not stated as uncontrolled   . Vitamin D deficiency   . Wears glasses      Allergies  Allergen Reactions  . Morphine And Related Other (See Comments)    fuzzy feeling    Current Outpatient Medications on File Prior to Visit  Medication Sig  . ACCU-CHEK AVIVA PLUS test strip Test 3 times a day  . Apple Cider Vinegar 188 MG CAPS Take by mouth.  Marland Kitchen aspirin 81 MG tablet Take 81 mg by mouth daily.  . ASSURE COMFORT LANCETS 30G MISC Check blood sugar 3 times daily-DX-E11.22  . bisoprolol-hydrochlorothiazide (ZIAC) 5-6.25 MG tablet Take 1/2 to 1 Tablet daily as directed for BP  . Blood Glucose Calibration (ACCU-CHEK AVIVA) SOLN Check blood sugar 3 times daily-DX-E11.22  . Blood Glucose Monitoring Suppl (ACCU-CHEK AVIVA PLUS) w/Device KIT Check Blood Sugar 4 x /day  . Blood Glucose Monitoring Suppl (ACCU-CHEK AVIVA) device Check blood sugar 3 times a day.  DX-E11.22  . Cholecalciferol (VITAMIN D3) 2000 UNITS TABS Take 1 capsule by mouth 2 (two) times daily.  . Cinnamon 500 MG TABS Take by mouth 2 (two) times daily.   . Continuous Blood Gluc Sensor MISC Check blood sugar 3 times a day- Dx: E11.8/Z79.4  . dorzolamide-timolol (COSOPT) 22.3-6.8 MG/ML ophthalmic solution   . EASY COMFORT INSULIN SYRINGE 30G X 1/2" 0.5 ML MISC   . Insulin Isophane & Regular Human  (NOVOLIN 70/30 FLEXPEN) (70-30) 100 UNIT/ML PEN Take 10 units with breakfast & take 5 units with dinner or as directed  . latanoprost (XALATAN) 0.005 % ophthalmic solution   . LOTEMAX 0.5 % GEL   . Magnesium 500 MG CAPS Take 250 mg by mouth daily.   . Multiple Vitamins-Minerals (HAIR/SKIN/NAILS) TABS Take by mouth 2 (two) times daily.  Marland Kitchen rOPINIRole (REQUIP) 3 MG tablet TAKE 2 TABLETS BY MOUTH AT BEDTIME FOR RESTLESSNESS LEGS  . rosuvastatin (CRESTOR) 20 MG tablet Take 1 tablet (20 mg total) by mouth at bedtime.  . senna-docusate (SENOKOT-S) 8.6-50 MG tablet Take 1 tablet by mouth daily.  . vitamin B-12 (CYANOCOBALAMIN) 1000 MCG tablet Take 1,000 mcg by mouth daily.  . vitamin C (ASCORBIC ACID) 500 MG tablet Take 500 mg by mouth daily.   No current facility-administered medications on file prior to visit.     ROS: all negative except above.   Physical Exam:  BP 136/60   Pulse (!) 54   Temp (!) 97.2 F (36.2 C)   Ht 5' 3.5" (1.613 m)   Wt 119 lb 9.6 oz (54.3 kg)   SpO2 99%   BMI 20.85 kg/m   General Appearance: Well nourished, in no apparent distress. Eyes: PERRLA, conjunctiva no swelling or erythema Sinuses: No Frontal/maxillary tenderness ENT/Mouth: Hearing normal.  Neck: Supple, thyroid normal.  Respiratory: Respiratory effort normal, BS equal bilaterally without rales, rhonchi, wheezing or stridor.  Cardio: RRR with no MRGs. Brisk peripheral pulses without edema.  Abdomen: Soft, + BS.  Non tender, no guarding, rebound, hernias, masses. Lymphatics: Non tender without lymphadenopathy.  Musculoskeletal: Symmetrical strength, normal gait.  Skin: Warm, dry without rashes, lesions, ecchymosis.  Neuro: Cranial nerves intact. Normal muscle tone, no cerebellar symptoms. Psych: Awake and oriented X 3, normal affect, Insight and Judgment appropriate.     Kelsey Ribas, NP 10:41 AM [name]

## 2018-06-13 ENCOUNTER — Ambulatory Visit (INDEPENDENT_AMBULATORY_CARE_PROVIDER_SITE_OTHER): Payer: Medicare Other | Admitting: Adult Health

## 2018-06-13 ENCOUNTER — Encounter: Payer: Self-pay | Admitting: Adult Health

## 2018-06-13 VITALS — BP 136/60 | HR 54 | Temp 97.2°F | Ht 63.5 in | Wt 119.6 lb

## 2018-06-13 DIAGNOSIS — I10 Essential (primary) hypertension: Secondary | ICD-10-CM | POA: Diagnosis not present

## 2018-06-13 DIAGNOSIS — Z789 Other specified health status: Secondary | ICD-10-CM | POA: Diagnosis not present

## 2018-06-13 DIAGNOSIS — E118 Type 2 diabetes mellitus with unspecified complications: Secondary | ICD-10-CM | POA: Diagnosis not present

## 2018-06-13 DIAGNOSIS — Z794 Long term (current) use of insulin: Secondary | ICD-10-CM | POA: Diagnosis not present

## 2018-06-13 NOTE — Patient Instructions (Addendum)
Continue to cut down on carbohydrates - white rice, biscuits, sweets, candy, ice cream, sweet drinks  Unlimited vegetables, protein (chicken, fish, etc)    Breakfast: 15 units with breakfast  Lunch:  take 5 units if glucose is less than 200 take 7 units if 200-250 Take 10 units if 300+  Dinner: take 7 units if <200 Take 10 units if 200-300  (Take 12 units if 300+) - be careful with this - remember low blood sugar is more dangerous than high   Targets for Glucose Readings: Time of Check Your goals/targets  Morning fasting  80-150  Before meals  Less than 180  Bedtime  Less than 180   Why Should You Check Your Blood Glucose? -The A1C tells you how your diabetes is doing over a 3 month period.  Home blood glucose monitoring (or checking) gives you information about your diabetes on a daily basis.  You will learn how well your diabetes care plan is working and whether your blood glucose is in your target range throughout the day.   -Reviewing daily blood glucose levels will help you and your healthcare team make any needed changes to you meal plan, physical activity and medications.  Meter Supplies: - A glucose meter was provided at your visit today along with strips and lancets. The blood glucose meter strips and lancets are usually covered by health insurance. Please let us know if they are not and contact your insurance to see which meter they prefer.  How to get a good blood sample: -Wash your hands in warm water.  You do not need to use alcohol wipes. -Massage your hands. -Choose which finger you will use.  It helps to use a different finger each time to avoid soreness. -Keep you hand below your wrist when using you lancet to "prick" your finger. -Apply gentle pressure, but do NOT squeeze your finger.  Checking your blood glucose Important Reminders: -Make sure your strips are not expired.  Check the date on the bottle. -Make sure the code on bottle matches the code on  your machine. -Make sure your hands are clean and dry. -Do not use the center of your finger, it is the most sensitive area.  Use a spot to the side of the center of your fingertip. -Completely fill the strip target area with blood (until it beeps) to make sure the results are accurate. -You will need a prescription to have your glucose strips and lancets covered by insurance. -There is usually an 800 number on the back of your meter for help with meter issues.  How often to check: -If you take diabetes pills or take one injection of insulin each day, you will usually be asked to check twice a day, before breakfast and 2 hours after one meal, or as directed. -If you take several insulin injections each day, you will usually be asked to check four times a day before meals and at bedtime every day.   Carbohydrate Counting for Diabetes Mellitus, Adult  Carbohydrate counting is a method of keeping track of how many carbohydrates you eat. Eating carbohydrates naturally increases the amount of sugar (glucose) in the blood. Counting how many carbohydrates you eat helps keep your blood glucose within normal limits, which helps you manage your diabetes (diabetes mellitus). It is important to know how many carbohydrates you can safely have in each meal. This is different for every person. A diet and nutrition specialist (registered dietitian) can help you make a meal plan and  calculate how many carbohydrates you should have at each meal and snack. Carbohydrates are found in the following foods:  Grains, such as breads and cereals.  Dried beans and soy products.  Starchy vegetables, such as potatoes, peas, and corn.  Fruit and fruit juices.  Milk and yogurt.  Sweets and snack foods, such as cake, cookies, candy, chips, and soft drinks. How do I count carbohydrates? There are two ways to count carbohydrates in food. You can use either of the methods or a combination of both. Reading "Nutrition  Facts" on packaged food The "Nutrition Facts" list is included on the labels of almost all packaged foods and beverages in the [place] It includes:  The serving size.  Information about nutrients in each serving, including the grams (g) of carbohydrate per serving. To use the "Nutrition Facts":  Decide how many servings you will have.  Multiply the number of servings by the number of carbohydrates per serving.  The resulting number is the total amount of carbohydrates that you will be having. Learning standard serving sizes of other foods When you eat carbohydrate foods that are not packaged or do not include "Nutrition Facts" on the label, you need to measure the servings in order to count the amount of carbohydrates:  Measure the foods that you will eat with a food scale or measuring cup, if needed.  Decide how many standard-size servings you will eat.  Multiply the number of servings by 15. Most carbohydrate-rich foods have about 15 g of carbohydrates per serving. ? For example, if you eat 8 oz (170 g) of strawberries, you will have eaten 2 servings and 30 g of carbohydrates (2 servings x 15 g = 30 g).  For foods that have more than one food mixed, such as soups and casseroles, you must count the carbohydrates in each food that is included. The following list contains standard serving sizes of common carbohydrate-rich foods. Each of these servings has about 15 g of carbohydrates:   hamburger bun or  English muffin.   oz (15 mL) syrup.   oz (14 g) jelly.  1 slice of bread.  1 six-inch tortilla.  3 oz (85 g) cooked rice or pasta.  4 oz (113 g) cooked dried beans.  4 oz (113 g) starchy vegetable, such as peas, corn, or potatoes.  4 oz (113 g) hot cereal.  4 oz (113 g) mashed potatoes or  of a large baked potato.  4 oz (113 g) canned or frozen fruit.  4 oz (120 mL) fruit juice.  4-6 crackers.  6 chicken nuggets.  6 oz (170 g) unsweetened dry cereal.  6 oz (170  g) plain fat-free yogurt or yogurt sweetened with artificial sweeteners.  8 oz (240 mL) milk.  8 oz (170 g) fresh fruit or one small piece of fruit.  24 oz (680 g) popped popcorn. Example of carbohydrate counting Sample meal  3 oz (85 g) chicken breast.  6 oz (170 g) brown rice.  4 oz (113 g) corn.  8 oz (240 mL) milk.  8 oz (170 g) strawberries with sugar-free whipped topping. Carbohydrate calculation 1. Identify the foods that contain carbohydrates: ? Rice. ? Corn. ? Milk. ? Strawberries. 2. Calculate how many servings you have of each food: ? 2 servings rice. ? 1 serving corn. ? 1 serving milk. ? 1 serving strawberries. 3. Multiply each number of servings by 15 g: ? 2 servings rice x 15 g = 30 g. ? 1 serving corn  x 15 g = 15 g. ? 1 serving milk x 15 g = 15 g. ? 1 serving strawberries x 15 g = 15 g. 4. Add together all of the amounts to find the total grams of carbohydrates eaten: ? 30 g + 15 g + 15 g + 15 g = 75 g of carbohydrates total. Summary  Carbohydrate counting is a method of keeping track of how many carbohydrates you eat.  Eating carbohydrates naturally increases the amount of sugar (glucose) in the blood.  Counting how many carbohydrates you eat helps keep your blood glucose within normal limits, which helps you manage your diabetes.  A diet and nutrition specialist (registered dietitian) can help you make a meal plan and calculate how many carbohydrates you should have at each meal and snack. This information is not intended to replace advice given to you by your health care provider. Make sure you discuss any questions you have with your health care provider. Document Released: 04/11/2005 Document Revised: 10/19/2016 Document Reviewed: 09/23/2015 Elsevier Interactive Patient Education  2019 [employer].

## 2018-06-19 ENCOUNTER — Ambulatory Visit: Payer: Self-pay | Admitting: Adult Health

## 2018-06-22 NOTE — Progress Notes (Signed)
Assessment and Plan:  Type 2 diabetes mellitus with hyperglycemia, with long-term current use of insulin (HCC) She did not implement insulin changes made at last visit Will have her work on this and will follow up in 1 month She will call if having frequent 400+ glucose, or with any low glucose values  Fructosamine 451 - need to continue increasing insulin though will proceed slowly in consideration of her age, historically brittle/labile glucose values, very limited health literacy  Insulin regimen Breakfast: 15 units with breakfast Lunch: take 5 units if glucose is less than 200, take 7 units if 200-250, Take 10 units if 300+ Dinner: take 7 units if <200, Take 10 units if 200-300, Take 12 units if 300+ - be careful with this - remember low blood sugar is more dangerous than high  Continue calling with reports of glucose if unusually low or high  Discussed low blood sugar is more dangerous than high, continue with close monitoring and glucose log papers provided and plan of care reviewed Insulin instructions printed in bold, high lighted, stapled to logs provided, reviewed step by step with patient  Hypoglycemia information provided and reviewed STOP processed carbs, fruit juice, cereal  Will follow up in 1 month or sooner if needed  Essential hypertension Continue medications, at goal today  Monitor blood pressure at home; call if consistently over 140/80 Continue DASH diet.   Reminder to go to the ER if any CP, SOB, nausea, dizziness, severe HA, changes vision/speech, left arm numbness and tingling and jaw pain.   Comprehensive care plan:  Patient/caregiver was given comprehensive care plan We will continue to monitor these goals every 3 months with an office visit and every month by a telephone call  Patient can contact the office any time with the phone number and get to a provider via the answering service or they can use Mychart.   Verbal permission was received from the  patient to review comprehensive care management, they understand they have the right to stop CCM services at any time.   The patient is self managing medications at home.  Medications were reviewed with the patient today as well as adherence and potential interactions.   The patient does not need home health services at this time.   Further disposition pending results of labs. Discussed med's effects and SE's.   Over 30 minutes of exam, counseling, chart review, and critical decision making was performed.   Future Appointments  Date Time Provider [place]  07/26/2018 11:15 AM [name], MD [facility] None  08/09/2018 10:50 AM [facility] MM 2 [facility]    ------------------------------------------------------------------------------------------------------------------   HPI BP 136/64   Pulse (!) 53   Temp (!) 97.5 F (36.4 C)   Ht 5' 3.5" (1.613 m)   Wt 120 lb (54.4 kg)   SpO2 97%   BMI 20.92 kg/m   85 y.o.female, enrolled in CCM, presents  for 2 week follow up on T2DM after recent transition from lantus and sliding scale humulog to Novolin 70/30 BID due to poor compliance with sliding scale attributed to limited health literacy and anxiety with complex regimen; she is no longer on metformin due to renal functions. She additionally saw Dr. Buddy Duty (endocrinology) but after visit reported she preferred to have [place] manage in office.   Initially glucose was improving significantly  Glucose was noted to be worsening again at last visit 2 weeks ago with several values 400+ Insulin was adjusted, but appears patient misplaced instructions and has  not been following  Glucose log for last 2 weeks reviewed today - she is somewhat inconsistent with dose of insulin that she is taking, taking lower amount than was recommended   For dates 2/19-3/2  Fasting: ranges 134-308,- no episodes of hypoglycemia  She is taking 15 units daily with breakfast, though on some days only  took 7 units  Pre-lunch: 103-390. Takes 5 units, taking 5-10 units, inconsistent, skipped lunch many days and took extra insulin with dinner instead  Pre-dinner: 174-468 (significantly higher than previous);  she takes 7-10 units with dinner, inconsistent in dose selection, has taken 15 units if she skipped lunch meal and dose  Component     Latest Ref Rng & Units 06/07/2018  Fructosamine     205 - 285 umol/L 451 (H)    Reviewed meals - she does continue to report some concerning food choices - still having cornbread occasionally, apple butter with toast, though she has switched to whole grain thin-sliced bread (low carb, high fiber).   She is improvement from previous 500+ glucose but recently not doing well with following plan Reviewed increasing insulin further with simple sliding scale while reminding her that low glucose is more dangerous than high, particularly considering her 85 age.  She denies waking up at night with cold sweats, shaking, or other hypoglycemic symptoms.   Glucose logs copied and scanned into chart   Lab Results  Component Value Date   GFRNONAA 37 (L) 05/09/2018   Lab Results  Component Value Date   CREATININE 1.30 (H) 05/09/2018   A1C goal is <8% due to her age Lab Results  Component Value Date   HGBA1C 9.7 (H) 04/05/2018    BMI is Body mass index is 20.92 kg/m. Wt Readings from Last 3 Encounters:  06/25/18 120 lb (54.4 kg)  06/13/18 119 lb 9.6 oz (54.3 kg)  06/07/18 120 lb (54.4 kg)    Past Medical History:  Diagnosis Date  . Arthritis   . Chronic back pain   . Diabetic neuropathy ([place])   . Hypercholesteremia   . Hypertension   . Liver lesion   . Neuropathy   . Type II or unspecified type diabetes mellitus without mention of complication, not stated as uncontrolled   . Vitamin D deficiency   . Wears glasses      Allergies  Allergen Reactions  . Morphine And Related Other (See Comments)    fuzzy feeling    Current Outpatient  Medications on File Prior to Visit  Medication Sig  . ACCU-CHEK AVIVA PLUS test strip Test 3 times a day  . Apple Cider Vinegar 188 MG CAPS Take by mouth.  Marland Kitchen aspirin 81 MG tablet Take 81 mg by mouth daily.  . ASSURE COMFORT LANCETS 30G MISC Check blood sugar 3 times daily-DX-E11.22  . bisoprolol-hydrochlorothiazide (ZIAC) 5-6.25 MG tablet Take 1/2 to 1 Tablet daily as directed for BP  . Blood Glucose Calibration (ACCU-CHEK AVIVA) SOLN Check blood sugar 3 times daily-DX-E11.22  . Blood Glucose Monitoring Suppl (ACCU-CHEK AVIVA PLUS) w/Device KIT Check Blood Sugar 4 x /day  . Blood Glucose Monitoring Suppl (ACCU-CHEK AVIVA) device Check blood sugar 3 times a day.  DX-E11.22  . Cholecalciferol (VITAMIN D3) 2000 UNITS TABS Take 1 capsule by mouth 2 (two) times daily.  . Cinnamon 500 MG TABS Take by mouth 2 (two) times daily.   . Continuous Blood Gluc Sensor MISC Check blood sugar 3 times a day- Dx: E11.8/Z79.4  . dorzolamide-timolol (COSOPT) 22.3-6.8 MG/ML ophthalmic solution   .  EASY COMFORT INSULIN SYRINGE 30G X 1/2" 0.5 ML MISC   . Insulin Isophane & Regular Human (NOVOLIN 70/30 FLEXPEN) (70-30) 100 UNIT/ML PEN Take 10 units with breakfast & take 5 units with dinner or as directed  . latanoprost (XALATAN) 0.005 % ophthalmic solution   . LOTEMAX 0.5 % GEL   . Magnesium 500 MG CAPS Take 250 mg by mouth daily.   . Multiple Vitamins-Minerals (HAIR/SKIN/NAILS) TABS Take by mouth 2 (two) times daily.  Marland Kitchen rOPINIRole (REQUIP) 3 MG tablet TAKE 2 TABLETS BY MOUTH AT BEDTIME FOR RESTLESSNESS LEGS  . rosuvastatin (CRESTOR) 20 MG tablet Take 1 tablet (20 mg total) by mouth at bedtime.  . senna-docusate (SENOKOT-S) 8.6-50 MG tablet Take 1 tablet by mouth daily.  . vitamin B-12 (CYANOCOBALAMIN) 1000 MCG tablet Take 1,000 mcg by mouth daily.  . vitamin C (ASCORBIC ACID) 500 MG tablet Take 500 mg by mouth daily.   No current facility-administered medications on file prior to visit.     ROS: all negative  except above.   Physical Exam:  BP 136/64   Pulse (!) 53   Temp (!) 97.5 F (36.4 C)   Ht 5' 3.5" (1.613 m)   Wt 120 lb (54.4 kg)   SpO2 97%   BMI 20.92 kg/m   General Appearance: Well nourished, in no apparent distress. Eyes: PERRLA, conjunctiva no swelling or erythema Sinuses: No Frontal/maxillary tenderness ENT/Mouth: Hearing normal.  Neck: Supple, thyroid normal.  Respiratory: Respiratory effort normal, BS equal bilaterally without rales, rhonchi, wheezing or stridor.  Cardio: RRR with no MRGs. Brisk peripheral pulses without edema.  Abdomen: Soft, + BS.  Non tender, no guarding, rebound, hernias, masses. Lymphatics: Non tender without lymphadenopathy.  Musculoskeletal: Symmetrical strength, normal gait.  Skin: Warm, dry without rashes, lesions, ecchymosis.  Neuro: Cranial nerves intact. Normal muscle tone, no cerebellar symptoms. Psych: Awake and oriented X 3, normal affect, Insight and Judgment appropriate.     Izora Ribas, NP 11:52 AM [name]

## 2018-06-25 ENCOUNTER — Encounter: Payer: Self-pay | Admitting: Adult Health

## 2018-06-25 ENCOUNTER — Ambulatory Visit (INDEPENDENT_AMBULATORY_CARE_PROVIDER_SITE_OTHER): Payer: Medicare Other | Admitting: Adult Health

## 2018-06-25 ENCOUNTER — Other Ambulatory Visit: Payer: Self-pay | Admitting: Adult Health

## 2018-06-25 VITALS — BP 136/64 | HR 53 | Temp 97.5°F | Ht 63.5 in | Wt 120.0 lb

## 2018-06-25 DIAGNOSIS — Z794 Long term (current) use of insulin: Secondary | ICD-10-CM | POA: Diagnosis not present

## 2018-06-25 DIAGNOSIS — Z789 Other specified health status: Secondary | ICD-10-CM | POA: Diagnosis not present

## 2018-06-25 DIAGNOSIS — E1140 Type 2 diabetes mellitus with diabetic neuropathy, unspecified: Secondary | ICD-10-CM

## 2018-06-25 DIAGNOSIS — E118 Type 2 diabetes mellitus with unspecified complications: Secondary | ICD-10-CM

## 2018-06-25 DIAGNOSIS — I10 Essential (primary) hypertension: Secondary | ICD-10-CM | POA: Diagnosis not present

## 2018-06-25 DIAGNOSIS — G2581 Restless legs syndrome: Secondary | ICD-10-CM

## 2018-06-25 NOTE — Patient Instructions (Addendum)
Goals    . HEMOGLOBIN A1C < 8.0     Continue to keep track of your food and insulin Suggest checking your sugar BEFORE breakfast, luch & dinner and bring that in with you Continue to check your sugars regularly and  BRING IN YOUR LOG    Insulin regimen (novolin 70/30) Breakfast: 15 units with breakfast Lunch:  take 5 units if glucose is less than 200  take 7 units if 200-250 Take 10 units if 300+  Dinner:  take 7 units if <200 Take 10 units if 200-300 Take 12 units if 300+ - be careful with this - remember low blood sugar is more dangerous than high  Continue calling with reports of glucose if unusually low or high  STOP processed biscuits, sweets, white rice, crackers, fruit juice, cereal  A LOW SUGAR IS MORE DANGEROUS THAN A HIGH SUGAR    . LDL CALC < 70     Take rosuvastatin daily in the evening for cholesterol          Continue to cut down on carbohydrates - white rice, biscuits, sweets, candy, ice cream, sweet drinks  Unlimited vegetables, protein (chicken, fish, etc)   Breakfast: 15 units with breakfast  Lunch:  take 5 units if glucose is less than 200 take 7 units if 200-250 Take 10 units if 300+  Dinner: take 7 units if <200 Take 10 units if 200-300  (Take 12 units if 300+) - be careful with this - remember low blood sugar is more dangerous than high   Targets for Glucose Readings: Time of Check Your goals/targets  Morning fasting  80-150  Before meals  Less than 180  Bedtime  Less than 180   Why Should You Check Your Blood Glucose? -The A1C tells you how your diabetes is doing over a 3 month period.  Home blood glucose monitoring (or checking) gives you information about your diabetes on a daily basis.  You will learn how well your diabetes care plan is working and whether your blood glucose is in your target range throughout the day.   -Reviewing daily blood glucose levels will help you and your healthcare team make any needed changes to  you meal plan, physical activity and medications.  Meter Supplies: - A glucose meter was provided at your visit today along with strips and lancets. The blood glucose meter strips and lancets are usually covered by health insurance. Please let us know if they are not and contact your insurance to see which meter they prefer.  How to get a good blood sample: -Wash your hands in warm water.  You do not need to use alcohol wipes. -Massage your hands. -Choose which finger you will use.  It helps to use a different finger each time to avoid soreness. -Keep you hand below your wrist when using you lancet to "prick" your finger. -Apply gentle pressure, but do NOT squeeze your finger.  Checking your blood glucose Important Reminders: -Make sure your strips are not expired.  Check the date on the bottle. -Make sure the code on bottle matches the code on your machine. -Make sure your hands are clean and dry. -Do not use the center of your finger, it is the most sensitive area.  Use a spot to the side of the center of your fingertip. -Completely fill the strip target area with blood (until it beeps) to make sure the results are accurate. -You will need a prescription to have your glucose strips and lancets  covered by insurance. -There is usually an 800 number on the back of your meter for help with meter issues.  How often to check: -If you take diabetes pills or take one injection of insulin each day, you will usually be asked to check twice a day, before breakfast and 2 hours after one meal, or as directed. -If you take several insulin injections each day, you will usually be asked to check four times a day before meals and at bedtime every day.   Carbohydrate Counting for Diabetes Mellitus, Adult   Carbohydrate counting is a method of keeping track of how many carbohydrates you eat. Eating carbohydrates naturally increases the amount of sugar (glucose) in the blood. Counting how many  carbohydrates you eat helps keep your blood glucose within normal limits, which helps you manage your diabetes (diabetes mellitus). It is important to know how many carbohydrates you can safely have in each meal. This is different for every person. A diet and nutrition specialist (registered dietitian) can help you make a meal plan and calculate how many carbohydrates you should have at each meal and snack. Carbohydrates are found in the following foods:  Grains, such as breads and cereals.  Dried beans and soy products.  Starchy vegetables, such as potatoes, peas, and corn.  Fruit and fruit juices.  Milk and yogurt.  Sweets and snack foods, such as cake, cookies, candy, chips, and soft drinks. How do I count carbohydrates? There are two ways to count carbohydrates in food. You can use either of the methods or a combination of both. Reading "Nutrition Facts" on packaged food The "Nutrition Facts" list is included on the labels of almost all packaged foods and beverages in the [place] It includes:  The serving size.  Information about nutrients in each serving, including the grams (g) of carbohydrate per serving. To use the "Nutrition Facts":  Decide how many servings you will have.  Multiply the number of servings by the number of carbohydrates per serving.  The resulting number is the total amount of carbohydrates that you will be having. Learning standard serving sizes of other foods When you eat carbohydrate foods that are not packaged or do not include "Nutrition Facts" on the label, you need to measure the servings in order to count the amount of carbohydrates:  Measure the foods that you will eat with a food scale or measuring cup, if needed.  Decide how many standard-size servings you will eat.  Multiply the number of servings by 15. Most carbohydrate-rich foods have about 15 g of carbohydrates per serving. ? For example, if you eat 8 oz (170 g) of strawberries, you will  have eaten 2 servings and 30 g of carbohydrates (2 servings x 15 g = 30 g).  For foods that have more than one food mixed, such as soups and casseroles, you must count the carbohydrates in each food that is included. The following list contains standard serving sizes of common carbohydrate-rich foods. Each of these servings has about 15 g of carbohydrates:   hamburger bun or  English muffin.   oz (15 mL) syrup.   oz (14 g) jelly.  1 slice of bread.  1 six-inch tortilla.  3 oz (85 g) cooked rice or pasta.  4 oz (113 g) cooked dried beans.  4 oz (113 g) starchy vegetable, such as peas, corn, or potatoes.  4 oz (113 g) hot cereal.  4 oz (113 g) mashed potatoes or  of a large baked potato.  4 oz (113 g) canned or frozen fruit.  4 oz (120 mL) fruit juice.  4-6 crackers.  6 chicken nuggets.  6 oz (170 g) unsweetened dry cereal.  6 oz (170 g) plain fat-free yogurt or yogurt sweetened with artificial sweeteners.  8 oz (240 mL) milk.  8 oz (170 g) fresh fruit or one small piece of fruit.  24 oz (680 g) popped popcorn. Example of carbohydrate counting Sample meal  3 oz (85 g) chicken breast.  6 oz (170 g) brown rice.  4 oz (113 g) corn.  8 oz (240 mL) milk.  8 oz (170 g) strawberries with sugar-free whipped topping. Carbohydrate calculation 1. Identify the foods that contain carbohydrates: ? Rice. ? Corn. ? Milk. ? Strawberries. 2. Calculate how many servings you have of each food: ? 2 servings rice. ? 1 serving corn. ? 1 serving milk. ? 1 serving strawberries. 3. Multiply each number of servings by 15 g: ? 2 servings rice x 15 g = 30 g. ? 1 serving corn x 15 g = 15 g. ? 1 serving milk x 15 g = 15 g. ? 1 serving strawberries x 15 g = 15 g. 4. Add together all of the amounts to find the total grams of carbohydrates eaten: ? 30 g + 15 g + 15 g + 15 g = 75 g of carbohydrates total. Summary  Carbohydrate counting is a method of keeping track of how  many carbohydrates you eat.  Eating carbohydrates naturally increases the amount of sugar (glucose) in the blood.  Counting how many carbohydrates you eat helps keep your blood glucose within normal limits, which helps you manage your diabetes.  A diet and nutrition specialist (registered dietitian) can help you make a meal plan and calculate how many carbohydrates you should have at each meal and snack. This information is not intended to replace advice given to you by your health care provider. Make sure you discuss any questions you have with your health care provider. Document Released: 04/11/2005 Document Revised: 10/19/2016 Document Reviewed: 09/23/2015 [employer]vier Interactive Patient Education  2019 [employer].

## 2018-07-04 DIAGNOSIS — C44722 Squamous cell carcinoma of skin of right lower limb, including hip: Secondary | ICD-10-CM | POA: Diagnosis not present

## 2018-07-05 ENCOUNTER — Encounter: Payer: Self-pay | Admitting: Internal Medicine

## 2018-07-10 DIAGNOSIS — C44712 Basal cell carcinoma of skin of right lower limb, including hip: Secondary | ICD-10-CM | POA: Diagnosis not present

## 2018-07-15 ENCOUNTER — Other Ambulatory Visit: Payer: Self-pay | Admitting: Physician Assistant

## 2018-07-15 DIAGNOSIS — E782 Mixed hyperlipidemia: Secondary | ICD-10-CM

## 2018-07-25 ENCOUNTER — Encounter: Payer: Self-pay | Admitting: Internal Medicine

## 2018-07-25 NOTE — Patient Instructions (Signed)
>>>>>>>>>>>>>>>>>>>>>>>>>>>>>>>>>>>>>>>>>>>>>>>>>>>>>>> Coronavirus (COVID-19) Are you at risk?  Are you at risk for the Coronavirus (COVID-19)?  To be considered HIGH RISK for Coronavirus (COVID-19), you have to meet the following criteria:  . Traveled to [place], [place], [place], [place] or [place]; or in the [place] to [place], [place], [place]  . or [place]; and have fever, cough, and shortness of breath within the last 2 weeks of travel OR . Been in close contact with a person diagnosed with COVID-19 within the last 2 weeks and have  . fever, cough,and shortness of breath .  . IF YOU DO NOT MEET THESE CRITERIA, YOU ARE CONSIDERED LOW RISK FOR COVID-19.  What to do if you are HIGH RISK for COVID-19?  Marland Kitchen If you are having a medical emergency, call 911. . Seek medical care right away. Before you go to a doctor's office, urgent care or emergency department, .  call ahead and tell them about your recent travel, contact with someone diagnosed with COVID-19  .  and your symptoms.  . You should receive instructions from your physician's office regarding next steps of care.  . When you arrive at healthcare provider, tell the healthcare staff immediately you have returned from  . visiting [place], [place], [place], [place] or [place]; or traveled in the [place] to [place], [place],  . [place] or [place] in the last two weeks or you have been in close contact with a person diagnosed with  . COVID-19 in the last 2 weeks.   . Tell the health care staff about your symptoms: fever, cough and shortness of breath. . After you have been seen by a medical provider, you will be either: o Tested for (COVID-19) and discharged home on quarantine except to seek medical care if  o symptoms worsen, and asked to  - Stay home and avoid contact with others until you get your results (4-5 days)  - Avoid travel on public transportation if possible (such as bus, train, or airplane)  or o Sent to the Emergency Department by EMS for evaluation, COVID-19 testing  and  o possible admission depending on your condition and test results.  What to do if you are LOW RISK for COVID-19?  Reduce your risk of any infection by using the same precautions used for avoiding the common cold or flu:  Marland Kitchen Wash your hands often with soap and warm water for at least 20 seconds.  If soap and water are not readily available,  . use an alcohol-based hand sanitizer with at least 60% alcohol.  . If coughing or sneezing, cover your mouth and nose by coughing or sneezing into the elbow areas of your shirt or coat, .  into a tissue or into your sleeve (not your hands). . Avoid shaking hands with others and consider head nods or verbal greetings only. . Avoid touching your eyes, nose, or mouth with unwashed hands.  . Avoid close contact with people who are sick. . Avoid places or events with large numbers of people in one location, like concerts or sporting events. . Carefully consider travel plans you have or are making. . If you are planning any travel outside or inside the [place], visit the CDC's Travelers' Health webpage for the latest health notices. . If you have some symptoms but not all symptoms, continue to monitor at home and seek medical attention  . if your symptoms worsen. . If you are having a medical emergency, call 911.   . >>>>>>>>>>>>>>>>>>>>>>>>>>>>>>>>> .  We Do NOT Approve of  [facility], [employer]  Our Patients  To Do Home Visits & We Do NOT Approve of [employer] > > > > > > > > > > > > > > > > > > > > > > > > > > > > > > > > > > > > > > >  Preventive Care for Adults  A healthy lifestyle and preventive care can promote health and wellness. Preventive health guidelines for women include the following key practices.  A routine yearly physical is a good way to check with your health care provider about your health and preventive screening. It is a  chance to share any concerns and updates on your health and to receive a thorough exam.  Visit your dentist for a routine exam and preventive care every 6 months. Brush your teeth twice a day and floss once a day. Good oral hygiene prevents tooth decay and gum disease.  The frequency of eye exams is based on your age, health, family medical history, use of contact lenses, and other factors. Follow your health care provider's recommendations for frequency of eye exams.  Eat a healthy diet. Foods like vegetables, fruits, whole grains, low-fat dairy products, and lean protein foods contain the nutrients you need without too many calories. Decrease your intake of foods high in solid fats, added sugars, and salt. Eat the right amount of calories for you. Get information about a proper diet from your health care provider, if necessary.  Regular physical exercise is one of the most important things you can do for your health. Most adults should get at least 150 minutes of moderate-intensity exercise (any activity that increases your heart rate and causes you to sweat) each week. In addition, most adults need muscle-strengthening exercises on 2 or more days a week.  Maintain a healthy weight. The body mass index (BMI) is a screening tool to identify possible weight problems. It provides an estimate of body fat based on height and weight. Your health care provider can find your BMI and can help you achieve or maintain a healthy weight. For adults 20 years and older:  A BMI below 18.5 is considered underweight.  A BMI of 18.5 to 24.9 is normal.  A BMI of 25 to 29.9 is considered overweight.  A BMI of 30 and above is considered obese.  Maintain normal blood lipids and cholesterol levels by exercising and minimizing your intake of saturated fat. Eat a balanced diet with plenty of fruit and vegetables. If your lipid or cholesterol levels are high, you are over 50, or you are at high risk for heart disease,  you may need your cholesterol levels checked more frequently. Ongoing high lipid and cholesterol levels should be treated with medicines if diet and exercise are not working.  If you smoke, find out from your health care provider how to quit. If you do not use tobacco, do not start.  Lung cancer screening is recommended for adults aged 60-80 years who are at high risk for developing lung cancer because of a history of smoking. A yearly low-dose CT scan of the lungs is recommended for people who have at least a 30-pack-year history of smoking and are a current smoker or have quit within the past 15 years. A pack year of smoking is smoking an average of 1 pack of cigarettes a day for 1 year (for example: 1 pack a day for 30 years or 2 packs a  day for 15 years). Yearly screening should continue until the smoker has stopped smoking for at least 15 years. Yearly screening should be stopped for people who develop a health problem that would prevent them from having lung cancer treatment.  Avoid use of street drugs. Do not share needles with anyone. Ask for help if you need support or instructions about stopping the use of drugs.  High blood pressure causes heart disease and increases the risk of stroke.  Ongoing high blood pressure should be treated with medicines if weight loss and exercise do not work.  If you are 16-48 years old, ask your health care provider if you should take aspirin to prevent strokes.  Diabetes screening involves taking a blood sample to check your fasting blood sugar level. This should be done once every 3 years, after age 8, if you are within normal weight and without risk factors for diabetes. Testing should be considered at a younger age or be carried out more frequently if you are overweight and have at least 1 risk factor for diabetes.  Breast cancer screening is essential preventive care for women. You should practice "breast self-awareness." This means understanding the  normal appearance and feel of your breasts and may include breast self-examination. Any changes detected, no matter how small, should be reported to a health care provider. Women in their 28s and 30s should have a clinical breast exam (CBE) by a health care provider as part of a regular health exam every 1 to 3 years. After age 45, women should have a CBE every year. Starting at age 38, women should consider having a mammogram (breast X-ray test) every year. Women who have a family history of breast cancer should talk to their health care provider about genetic screening. Women at a high risk of breast cancer should talk to their health care providers about having an MRI and a mammogram every year.  Breast cancer gene (BRCA)-related cancer risk assessment is recommended for women who have family members with BRCA-related cancers. BRCA-related cancers include breast, ovarian, tubal, and peritoneal cancers. Having family members with these cancers may be associated with an increased risk for harmful changes (mutations) in the breast cancer genes BRCA1 and BRCA2. Results of the assessment will determine the need for genetic counseling and BRCA1 and BRCA2 testing.  Routine pelvic exams to screen for cancer are no longer recommended for nonpregnant women who are considered low risk for cancer of the pelvic organs (ovaries, uterus, and vagina) and who do not have symptoms. Ask your health care provider if a screening pelvic exam is right for you.  If you have had past treatment for cervical cancer or a condition that could lead to cancer, you need Pap tests and screening for cancer for at least 20 years after your treatment. If Pap tests have been discontinued, your risk factors (such as having a new sexual partner) need to be reassessed to determine if screening should be resumed. Some women have medical problems that increase the chance of getting cervical cancer. In these cases, your health care provider may  recommend more frequent screening and Pap tests.    Colorectal cancer can be detected and often prevented. Most routine colorectal cancer screening begins at the age of 69 years and continues through age 55 years. However, your health care provider may recommend screening at an earlier age if you have risk factors for colon cancer. On a yearly basis, your health care provider may provide home test kits to  check for hidden blood in the stool. Use of a small camera at the end of a tube, to directly examine the colon (sigmoidoscopy or colonoscopy), can detect the earliest forms of colorectal cancer. Talk to your health care provider about this at age 50, when routine screening begins.  Direct exam of the colon should be repeated every 5-10 years through age 75 years, unless early forms of pre-cancerous polyps or small growths are found.  Osteoporosis is a disease in which the bones lose minerals and strength with aging. This can result in serious bone fractures or breaks. The risk of osteoporosis can be identified using a bone density scan. Women ages 65 years and over and women at risk for fractures or osteoporosis should discuss screening with their health care providers. Ask your health care provider whether you should take a calcium supplement or vitamin D to reduce the rate of osteoporosis.  Menopause can be associated with physical symptoms and risks. Hormone replacement therapy is available to decrease symptoms and risks. You should talk to your health care provider about whether hormone replacement therapy is right for you.  Use sunscreen. Apply sunscreen liberally and repeatedly throughout the day. You should seek shade when your shadow is shorter than you. Protect yourself by wearing long sleeves, pants, a wide-brimmed hat, and sunglasses year round, whenever you are outdoors.  Once a month, do a whole body skin exam, using a mirror to look at the skin on your back. Tell your health care provider  of new moles, moles that have irregular borders, moles that are larger than a pencil eraser, or moles that have changed in shape or color.  Stay current with required vaccines (immunizations).  Influenza vaccine. All adults should be immunized every year.  Tetanus, diphtheria, and acellular pertussis (Td, Tdap) vaccine. Pregnant women should receive 1 dose of Tdap vaccine during each pregnancy. The dose should be obtained regardless of the length of time since the last dose. Immunization is preferred during the 27th-36th week of gestation. An adult who has not previously received Tdap or who does not know her vaccine status should receive 1 dose of Tdap. This initial dose should be followed by tetanus and diphtheria toxoids (Td) booster doses every 10 years. Adults with an unknown or incomplete history of completing a 3-dose immunization series with Td-containing vaccines should begin or complete a primary immunization series including a Tdap dose. Adults should receive a Td booster every 10 years.    Zoster vaccine. One dose is recommended for adults aged 60 years or older unless certain conditions are present.    Pneumococcal 13-valent conjugate (PCV13) vaccine. When indicated, a person who is uncertain of her immunization history and has no record of immunization should receive the PCV13 vaccine. An adult aged 19 years or older who has certain medical conditions and has not been previously immunized should receive 1 dose of PCV13 vaccine. This PCV13 should be followed with a dose of pneumococcal polysaccharide (PPSV23) vaccine. The PPSV23 vaccine dose should be obtained at least 1 or more year(s) after the dose of PCV13 vaccine. An adult aged 19 years or older who has certain medical conditions and previously received 1 or more doses of PPSV23 vaccine should receive 1 dose of PCV13. The PCV13 vaccine dose should be obtained 1 or more years after the last PPSV23 vaccine dose.    Pneumococcal  polysaccharide (PPSV23) vaccine. When PCV13 is also indicated, PCV13 should be obtained first. All adults aged 65 years and older   should be immunized. An adult younger than age 58 years who has certain medical conditions should be immunized. Any person who resides in a nursing home or long-term care facility should be immunized. An adult smoker should be immunized. People with an immunocompromised condition and certain other conditions should receive both PCV13 and PPSV23 vaccines. People with human immunodeficiency virus (HIV) infection should be immunized as soon as possible after diagnosis. Immunization during chemotherapy or radiation therapy should be avoided. Routine use of PPSV23 vaccine is not recommended for American Indians, [place] Natives, or people younger than 65 years unless there are medical conditions that require PPSV23 vaccine. When indicated, people who have unknown immunization and have no record of immunization should receive PPSV23 vaccine. One-time revaccination 5 years after the first dose of PPSV23 is recommended for people aged 19-64 years who have chronic kidney failure, nephrotic syndrome, asplenia, or immunocompromised conditions. People who received 1-2 doses of PPSV23 before age 82 years should receive another dose of PPSV23 vaccine at age 2 years or later if at least 5 years have passed since the previous dose. Doses of PPSV23 are not needed for people immunized with PPSV23 at or after age 9 years.   Preventive Services / Frequency  Ages 40 years and over  Blood pressure check.  Lipid and cholesterol check.  Lung cancer screening. / Every year if you are aged 49-80 years and have a 30-pack-year history of smoking and currently smoke or have quit within the past 15 years. Yearly screening is stopped once you have quit smoking for at least 15 years or develop a health problem that would prevent you from having lung cancer treatment.  Clinical breast exam.** / Every year  after age 53 years.   BRCA-related cancer risk assessment.** / For women who have family members with a BRCA-related cancer (breast, ovarian, tubal, or peritoneal cancers).  Mammogram.** / Every year beginning at age 56 years and continuing for as long as you are in good health. Consult with your health care provider.  Pap test.** / Every 3 years starting at age 73 years through age 8 or 48 years with 3 consecutive normal Pap tests. Testing can be stopped between 65 and 70 years with 3 consecutive normal Pap tests and no abnormal Pap or HPV tests in the past 10 years.  Fecal occult blood test (FOBT) of stool. / Every year beginning at age 58 years and continuing until age 53 years. You may not need to do this test if you get a colonoscopy every 10 years.  Flexible sigmoidoscopy or colonoscopy.** / Every 5 years for a flexible sigmoidoscopy or every 10 years for a colonoscopy beginning at age 35 years and continuing until age 78 years.  Hepatitis C blood test.** / For all people born from [birth year] through 1963 and any individual with known risks for hepatitis C.  Osteoporosis screening.** / A one-time screening for women ages 54 years and over and women at risk for fractures or osteoporosis.  Skin self-exam. / Monthly.  Influenza vaccine. / Every year.  Tetanus, diphtheria, and acellular pertussis (Tdap/Td) vaccine.** / 1 dose of Td every 10 years.  Zoster vaccine.** / 1 dose for adults aged 48 years or older.  Pneumococcal 13-valent conjugate (PCV13) vaccine.** / Consult your health care provider.  Pneumococcal polysaccharide (PPSV23) vaccine.** / 1 dose for all adults aged 14 years and older. Screening for abdominal aortic aneurysm (AAA)  by ultrasound is recommended for people who have history of high blood pressure  or who are current or former smokers. ++++++++++++++++++++ Recommend Adult Low Dose Aspirin or  coated  Aspirin 81 mg daily  To reduce risk of Colon Cancer 20 %,  Skin  Cancer 26 % ,  Melanoma 46%  and  Pancreatic cancer 60% ++++++++++++++++++++ Vitamin D goal  is between 70-100.  Please make sure that you are taking your Vitamin D as directed.  It is very important as a natural anti-inflammatory  helping hair, skin, and nails, as well as reducing stroke and heart attack risk.  It helps your bones and helps with mood. It also decreases numerous cancer risks so please take it as directed.  Low Vit D is associated with a 200-300% higher risk for CANCER  and 200-300% higher risk for HEART   ATTACK  &  STROKE.   .....................................Marland Kitchen It is also associated with higher death rate at younger ages,  autoimmune diseases like Rheumatoid arthritis, Lupus, Multiple Sclerosis.    Also many other serious conditions, like depression, Alzheimer's Dementia, infertility, muscle aches, fatigue, fibromyalgia - just to name a few. ++++++++++++++++++ Recommend the book "The END of DIETING" by Dr Excell Seltzer  & the book "The END of DIABETES " by Dr Excell Seltzer At [email or web address] - get book & Audio CD's    Being diabetic has a  300% increased risk for heart attack, stroke, cancer, and alzheimer- type vascular dementia. It is very important that you work harder with diet by avoiding all foods that are white. Avoid white rice (brown & wild rice is OK), white potatoes (sweetpotatoes in moderation is OK), White bread or wheat bread or anything made out of white flour like bagels, donuts, rolls, buns, biscuits, cakes, pastries, cookies, pizza crust, and pasta (made from white flour & egg whites) - vegetarian pasta or spinach or wheat pasta is OK. Multigrain breads like Arnold's or Pepperidge [employer], or multigrain sandwich thins or flatbreads.  Diet, exercise and weight loss can reverse and cure diabetes in the early stages.  Diet, exercise and weight loss is very important in the control and prevention of complications of diabetes which affects every system in your body, ie.  Brain - dementia/stroke, eyes - glaucoma/blindness, heart - heart attack/heart failure, kidneys - dialysis, stomach - gastric paralysis, intestines - malabsorption, nerves - severe painful neuritis, circulation - gangrene & loss of a leg(s), and finally cancer and Alzheimers.    I recommend avoid fried & greasy foods,  sweets/candy, white rice (brown or wild rice or Quinoa is OK), white potatoes (sweet potatoes are OK) - anything made from white flour - bagels, doughnuts, rolls, buns, biscuits,white and wheat breads, pizza crust and traditional pasta made of white flour & egg white(vegetarian pasta or spinach or wheat pasta is OK).  Multi-grain bread is OK - like multi-grain flat bread or sandwich thins. Avoid alcohol in excess. Exercise is also important.    Eat all the vegetables you want - avoid meat, especially red meat and dairy - especially cheese.  Cheese is the most concentrated form of trans-fats which is the worst thing to clog up our arteries. Veggie cheese is OK which can be found in the fresh produce section at [employer] or [employer] or [employer]  +++++++++++++++++++ DASH Eating Plan  DASH stands for "Dietary Approaches to Stop Hypertension."   The DASH eating plan is a healthy eating plan that has been shown to reduce high blood pressure (hypertension). Additional health benefits may include reducing the risk of type 2 diabetes mellitus, heart  disease, and stroke. The DASH eating plan may also help with weight loss. WHAT DO I NEED TO KNOW ABOUT THE DASH EATING PLAN? For the DASH eating plan, you will follow these general guidelines:  Choose foods with a percent daily value for sodium of less than 5% (as listed on the food label).  Use salt-free seasonings or herbs instead of table salt or sea salt.  Check with your health care provider or pharmacist before using salt substitutes.  Eat lower-sodium products, often labeled as "lower sodium" or "no salt added."  Eat fresh  foods.  Eat more vegetables, fruits, and low-fat dairy products.  Choose whole grains. Look for the word "whole" as the first word in the ingredient list.  Choose fish   Limit sweets, desserts, sugars, and sugary drinks.  Choose heart-healthy fats.  Eat veggie cheese   Eat more home-cooked food and less restaurant, buffet, and fast food.  Limit fried foods.  Cook foods using methods other than frying.  Limit canned vegetables. If you do use them, rinse them well to decrease the sodium.  When eating at a restaurant, ask that your food be prepared with less salt, or no salt if possible.                      WHAT FOODS CAN I EAT? Read Dr Fara Olden Fuhrman's books on The End of Dieting & The End of Diabetes  Grains Whole grain or whole wheat bread. Brown rice. Whole grain or whole wheat pasta. Quinoa, bulgur, and whole grain cereals. Low-sodium cereals. Corn or whole wheat flour tortillas. Whole grain cornbread. Whole grain crackers. Low-sodium crackers.  Vegetables Fresh or frozen vegetables (raw, steamed, roasted, or grilled). Low-sodium or reduced-sodium tomato and vegetable juices. Low-sodium or reduced-sodium tomato sauce and paste. Low-sodium or reduced-sodium canned vegetables.   Fruits All fresh, canned (in natural juice), or frozen fruits.  Protein Products  All fish and seafood.  Dried beans, peas, or lentils. Unsalted nuts and seeds. Unsalted canned beans.  Dairy Low-fat dairy products, such as skim or 1% milk, 2% or reduced-fat cheeses, low-fat ricotta or cottage cheese, or plain low-fat yogurt. Low-sodium or reduced-sodium cheeses.  Fats and Oils Tub margarines without trans fats. Light or reduced-fat mayonnaise and salad dressings (reduced sodium). Avocado. Safflower, olive, or canola oils. Natural peanut or almond butter.  Other Unsalted popcorn and pretzels. The items listed above may not be a complete list of recommended foods or beverages. Contact your  dietitian for more options.  +++++++++++++++  WHAT FOODS ARE NOT RECOMMENDED? Grains/ White flour or wheat flour White bread. White pasta. White rice. Refined cornbread. Bagels and croissants. Crackers that contain trans fat.  Vegetables  Creamed or fried vegetables. Vegetables in a . Regular canned vegetables. Regular canned tomato sauce and paste. Regular tomato and vegetable juices.  Fruits Dried fruits. Canned fruit in light or heavy syrup. Fruit juice.  Meat and Other Protein Products Meat in general - RED meat & White meat.  Fatty cuts of meat. Ribs, chicken wings, all processed meats as bacon, sausage, bologna, salami, fatback, hot dogs, bratwurst and packaged luncheon meats.  Dairy Whole or 2% milk, cream, half-and-half, and cream cheese. Whole-fat or sweetened yogurt. Full-fat cheeses or blue cheese. Non-dairy creamers and whipped toppings. Processed cheese, cheese spreads, or cheese curds.  Condiments Onion and garlic salt, seasoned salt, table salt, and sea salt. Canned and packaged gravies. Worcestershire sauce. Tartar sauce. Barbecue sauce. Teriyaki sauce. Soy sauce, including  reduced sodium. Steak sauce. Fish sauce. Oyster sauce. Cocktail sauce. Horseradish. Ketchup and mustard. Meat flavorings and tenderizers. Bouillon cubes. Hot sauce. Tabasco sauce. Marinades. Taco seasonings. Relishes.  Fats and Oils Butter, stick margarine, lard, shortening and bacon fat. Coconut, palm kernel, or palm oils. Regular salad dressings.  Pickles and olives. Salted popcorn and pretzels.  The items listed above may not be a complete list of foods and beverages to avoid.

## 2018-07-25 NOTE — Progress Notes (Signed)
[facility] [name], M.D.     [name]. Silverio Lay, P.A.-C Liane Comber, [place], [place] [address] Telephone [phone] Telefax [phone] Annual Screening/Preventative Visit & Comprehensive Evaluation &  Examination     This very nice 85 y.o. MWF presents for a Screening /Preventative Visit & comprehensive evaluation and management of multiple medical co-morbidities.  Patient has been followed for HTN, HLD, T2_IDDM/CKD3 and Vitamin D Deficiency.      HTN predates circa 2008. [name] BP has been controlled at home and patient denies any cardiac symptoms as chest pain, palpitations, shortness of breath, dizziness or ankle swelling. Today's BP is at goal - 124/54.     [name] hyperlipidemia is controlled with diet and medications. Patient denies myalgias or other medication SE's. Last lipids were at goal: Lab Results  Component Value Date   CHOL 140 04/05/2018   HDL 68 04/05/2018   LDLCALC 56 04/05/2018   TRIG 76 04/05/2018   CHOLHDL 2.1 04/05/2018      Patient has hx/o T2_NIDDM (1996) and was initiated on Insulin in 2000. Patient has limited comprehension and consequent poor compliance as reflected in her chronically elevated A1c's.  Patient denies reactive hypoglycemic symptoms, diabetic polys. She is followed by Dr's [facility] for her Retinopathy and has ongoing intra ocular injections. 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   She is on Novolin 70/30 and reports am  FBG's range 150 - 200's+, Lunch CBG's around 200's+ . She usually takes Nov 70/30  - 15 u qam, and Lunch another 15 u and then at supper take 7-10 u. . She admits frequently eating drive-thru fast foods.  Last A1c was not at goal: Lab Results  Component Value Date   HGBA1C 9.7 (H) 04/05/2018      Finally,  patient has history of Vitamin D Deficiency and last Vitamin D was near goal (70-100): Lab Results  Component Value Date   VD25OH 54 12/20/2017   Current Outpatient Medications on File Prior to Visit  Medication Sig  . ACCU-CHEK AVIVA PLUS test strip Test 3 times a day  . aspirin 81 MG tablet Take 81 mg by mouth daily.  . ASSURE COMFORT LANCETS 30G MISC Check blood sugar 3 times daily-DX-E11.22  . bisoprolol-hydrochlorothiazide (ZIAC) 5-6.25 MG tablet Take 1/2 to 1 Tablet daily as directed for BP  . Blood Glucose Calibration (ACCU-CHEK AVIVA) SOLN Check blood sugar 3 times daily-DX-E11.22  . Blood Glucose Monitoring Suppl (ACCU-CHEK AVIVA PLUS) w/Device KIT Check Blood Sugar 4 x /day  . Blood Glucose Monitoring Suppl (ACCU-CHEK AVIVA) device Check blood sugar 3 times a day.  DX-E11.22  . Cholecalciferol (VITAMIN D3) 2000 UNITS TABS Take 1 capsule by mouth 2 (two) times daily.  . Cinnamon 500 MG TABS Take by mouth 2 (two) times daily.   . Continuous Blood Gluc Sensor MISC Check blood sugar 3 times a day- Dx: E11.8/Z79.4  . dorzolamide-timolol (COSOPT) 22.3-6.8 MG/ML ophthalmic solution   . EASY COMFORT INSULIN SYRINGE 30G X 1/2" 0.5 ML MISC   . Insulin Isophane & Regular Human (NOVOLIN 70/30 FLEXPEN) (70-30) 100 UNIT/ML PEN Take 10 units with breakfast & take 5 units with dinner or as directed  . latanoprost (XALATAN) 0.005 % ophthalmic solution   . LOTEMAX 0.5 % GEL   . Magnesium 500 MG CAPS Take 250 mg by mouth  daily.   . Multiple Vitamins-Minerals (HAIR/SKIN/NAILS) TABS Take by mouth 2 (two) times daily.  Marland Kitchen rOPINIRole (REQUIP) 3 MG tablet TAKE 2 TABLETS BY MOUTH AT BEDTIME FOR RESTLESSNESS  . rosuvastatin (CRESTOR) 20 MG tablet Take 1 tablet Daily for Cholesterol  . senna-docusate (SENOKOT-S) 8.6-50 MG tablet Take 1 tablet by mouth daily.  . vitamin B-12 (CYANOCOBALAMIN) 1000 MCG tablet Take 1,000 mcg by mouth daily.  . vitamin C (ASCORBIC ACID) 500 MG tablet Take 500 mg by mouth daily.   Marland Kitchen Apple Cider Vinegar 188 MG CAPS Take by mouth.   No current facility-administered medications on file prior to visit.    Allergies  Allergen Reactions  . Morphine And Related Other (See Comments)    fuzzy feeling   Past Medical History:  Diagnosis Date  . Arthritis   . Chronic back pain   . Diabetic neuropathy ([place])   . Hypercholesteremia   . Hypertension   . Liver lesion   . Neuropathy   . Type II or unspecified type diabetes mellitus without mention of complication, not stated as uncontrolled   . Vitamin D deficiency   . Wears glasses    Health Maintenance  Topic Date Due  . HEMOGLOBIN A1C  10/05/2018  . INFLUENZA VACCINE  11/24/2018  . OPHTHALMOLOGY EXAM  02/13/2019  . FOOT EXAM  07/25/2019  . TETANUS/TDAP  01/03/2021  . DEXA SCAN  Completed  . PNA vac Low Risk Adult  Completed   Immunization History  Administered Date(s) Administered  . DT 01/04/2011  . Influenza Split 01/23/2013  . Influenza, High Dose Seasonal PF 02/27/2014, 03/11/2015, 12/21/2015, 01/30/2017, 02/08/2018  . Pneumococcal Conjugate-13 02/27/2014  . Pneumococcal Polysaccharide-23 01/06/2012   Last Colon - 02/17/2005 _ Dr Amedeo Plenty   Last MGM - 06/28/2017  Past Surgical History:  Procedure Laterality Date  . ABDOMINAL HYSTERECTOMY    . APPENDECTOMY    . BACK SURGERY  1993   lumb lam-fusion  . BREAST EXCISIONAL BIOPSY Right 1967   benign  . CESAREAN SECTION     x 3   . COLONOSCOPY    . EYE SURGERY     both cataracts  . TRIGGER FINGER RELEASE  01/10/2012   Procedure: RELEASE TRIGGER FINGER/A-1 PULLEY;  Surgeon: Hessie Dibble, MD;  Location: [facility];  Service: Orthopedics;  Laterality: Right;  right ring trigger release   Family History  Problem Relation Age of Onset  . Liver disease Sister   . Diabetes Sister   . Colon cancer Neg Hx    Social History   Tobacco Use  . Smoking status: Never Smoker  . Smokeless tobacco: Never Used  Substance Use Topics  .  Alcohol use: No  . Drug use: No    ROS Constitutional: Denies fever, chills, weight loss/gain, headaches, insomnia,  night sweats, and change in appetite. Does c/o fatigue. Eyes: Denies redness, blurred vision, diplopia, discharge, itchy, watery eyes.  ENT: Denies discharge, congestion, post nasal drip, epistaxis, sore throat, earache, hearing loss, dental pain, Tinnitus, Vertigo, Sinus pain, snoring.  Cardio: Denies chest pain, palpitations, irregular heartbeat, syncope, dyspnea, diaphoresis, orthopnea, PND, claudication, edema Respiratory: denies cough, dyspnea, DOE, pleurisy, hoarseness, laryngitis, wheezing.  Gastrointestinal: Denies dysphagia, heartburn, reflux, water brash, pain, cramps, nausea, vomiting, bloating, diarrhea, constipation, hematemesis, melena, hematochezia, jaundice, hemorrhoids Genitourinary: Denies dysuria, frequency, urgency, nocturia, hesitancy, discharge, hematuria, flank pain Breast: Breast lumps, nipple discharge, bleeding.  Musculoskeletal: Denies arthralgia, myalgia, stiffness, Jt. Swelling, pain, limp, and strain/sprain. Denies falls. Skin: Denies  puritis, rash, hives, warts, acne, eczema, changing in skin lesion Neuro: No weakness, tremor, incoordination, spasms, paresthesia, pain Psychiatric: Denies confusion, memory loss, sensory loss. Denies Depression. Endocrine: Denies change in weight, skin, hair change, nocturia, and paresthesia, diabetic polys, visual blurring, hyper / hypo glycemic episodes.  Heme/Lymph: No excessive bleeding, bruising, enlarged lymph nodes.  Physical Exam  BP (!) 124/52   Pulse (!) 52   Temp (!) 97 F (36.1 C)   Resp 16   Ht 5' 4"  (1.626 m)   Wt 122 lb 3.2 oz (55.4 kg)   BMI 20.98 kg/m   General Appearance:  Well groomed and in no apparent distress.  Eyes: PERRLA, EOMs, conjunctiva no swelling or erythema, normal fundi and vessels. Sinuses: No frontal/maxillary tenderness ENT/Mouth: EACs patent / TMs  nl. Nares clear  without erythema, swelling, mucoid exudates. Oral hygiene is good. No erythema, swelling, or exudate. Tongue normal, non-obstructing. Tonsils not swollen or erythematous. Hearing normal.  Neck: Supple, thyroid not palpable. No bruits, nodes or JVD. Respiratory: Respiratory effort normal.  BS equal and clear bilateral without rales, rhonci, wheezing or stridor. Cardio: Heart sounds are normal with regular rate and rhythm and no murmurs, rubs or gallops. Peripheral pulses are normal and equal bilaterally without edema. No aortic or femoral bruits. Chest: symmetric with normal excursions and percussion. Breasts: Symmetric, without lumps, nipple discharge, retractions, or fibrocystic changes.  Abdomen: Flat, soft with bowel sounds active. Nontender, no guarding, rebound, hernias, masses, or organomegaly.  Lymphatics: Non tender without lymphadenopathy.  Musculoskeletal: Full ROM all peripheral extremities, joint stability, 5/5 strength, and normal gait. Skin: Warm and dry without rashes, lesions, cyanosis, clubbing or  ecchymosis.  Neuro: Cranial nerves intact, reflexes equal bilaterally. Normal muscle tone, no cerebellar symptoms. Sensation is decreased to touch, vibratory and Monofilament mid shin to the toes bilaterally. Pysch: Alert and oriented X 3, normal affect, Insight and Judgment appropriate.   Assessment and Plan  1. Annual Preventative Screening Examination  2. Essential hypertension  - EKG 12-Lead - Urinalysis, Routine w reflex microscopic - Microalbumin / creatinine urine ratio - CBC with Differential/Platelet - COMPLETE METABOLIC PANEL WITH GFR - Magnesium - TSH  3. Hyperlipidemia, mixed  - EKG 12-Lead - Lipid panel - TSH  4. Type 2 diabetes mellitus with complication, with long-term current use of insulin (HCC)  - EKG 12-Lead - Urinalysis, Routine w reflex microscopic - Microalbumin / creatinine urine ratio - HM DIABETES FOOT EXAM - LOW EXTREMITY NEUR EXAM DOCUM -  Hemoglobin A1c  5. Vitamin D deficiency  - VITAMIN D 25 Hydroxy  6. Hypothyroidism  - TSH  7. Type 2 diabetes mellitus with stage 3 chronic kidney disease, with long-term current use of insulin (HCC)  - Urinalysis, Routine w reflex microscopic - Microalbumin / creatinine urine ratio - PTH, intact and calcium  8. Atherosclerosis of aorta (HCC)  - EKG 12-Lead  9. Diabetic retinopathy associated with type 2 diabetes mellitus, macular edema presence unspecified, unspecified laterality, unspecified retinopathy severity (HCC)  - Hemoglobin A1c  10. Screening for ischemic heart disease  - EKG 12-Lead  11. Medication management  - Urinalysis, Routine w reflex microscopic - Microalbumin / creatinine urine ratio - CBC with Differential/Platelet - COMPLETE METABOLIC PANEL WITH GFR - Magnesium - Lipid panel - TSH - Hemoglobin A1c - VITAMIN D 25 Hydroxyl - PTH, intact and calcium          Patient was counseled in prudent diet to achieve/maintain BMI less than 25 for weight control,  BP monitoring, regular exercise and medications. Discussed med's effects and SE's. Screening labs and tests as requested with regular follow-up as recommended. Over 40 minutes of exam, counseling, chart review and high complex critical decision making was performed.

## 2018-07-26 ENCOUNTER — Other Ambulatory Visit: Payer: Self-pay

## 2018-07-26 ENCOUNTER — Ambulatory Visit (INDEPENDENT_AMBULATORY_CARE_PROVIDER_SITE_OTHER): Payer: Medicare Other | Admitting: Internal Medicine

## 2018-07-26 VITALS — BP 124/52 | HR 52 | Temp 97.0°F | Resp 16 | Ht 64.0 in | Wt 122.2 lb

## 2018-07-26 DIAGNOSIS — N183 Chronic kidney disease, stage 3 unspecified: Secondary | ICD-10-CM

## 2018-07-26 DIAGNOSIS — E1122 Type 2 diabetes mellitus with diabetic chronic kidney disease: Secondary | ICD-10-CM

## 2018-07-26 DIAGNOSIS — Z79899 Other long term (current) drug therapy: Secondary | ICD-10-CM

## 2018-07-26 DIAGNOSIS — E11319 Type 2 diabetes mellitus with unspecified diabetic retinopathy without macular edema: Secondary | ICD-10-CM | POA: Diagnosis not present

## 2018-07-26 DIAGNOSIS — Z0000 Encounter for general adult medical examination without abnormal findings: Secondary | ICD-10-CM | POA: Diagnosis not present

## 2018-07-26 DIAGNOSIS — Z0001 Encounter for general adult medical examination with abnormal findings: Secondary | ICD-10-CM

## 2018-07-26 DIAGNOSIS — E118 Type 2 diabetes mellitus with unspecified complications: Secondary | ICD-10-CM

## 2018-07-26 DIAGNOSIS — E782 Mixed hyperlipidemia: Secondary | ICD-10-CM

## 2018-07-26 DIAGNOSIS — I700 Atherosclerosis of aorta: Secondary | ICD-10-CM

## 2018-07-26 DIAGNOSIS — I10 Essential (primary) hypertension: Secondary | ICD-10-CM

## 2018-07-26 DIAGNOSIS — Z794 Long term (current) use of insulin: Secondary | ICD-10-CM

## 2018-07-26 DIAGNOSIS — E559 Vitamin D deficiency, unspecified: Secondary | ICD-10-CM

## 2018-07-26 DIAGNOSIS — Z136 Encounter for screening for cardiovascular disorders: Secondary | ICD-10-CM

## 2018-07-26 DIAGNOSIS — E039 Hypothyroidism, unspecified: Secondary | ICD-10-CM

## 2018-07-27 LAB — URINALYSIS, ROUTINE W REFLEX MICROSCOPIC
Bacteria, UA: NONE SEEN /HPF
Bilirubin Urine: NEGATIVE
Glucose, UA: NEGATIVE
Hgb urine dipstick: NEGATIVE
Hyaline Cast: NONE SEEN /LPF
Ketones, ur: NEGATIVE
Nitrite: NEGATIVE
Protein, ur: NEGATIVE
RBC / HPF: NONE SEEN /HPF (ref 0–2)
Specific Gravity, Urine: 1.02 (ref 1.001–1.03)
Squamous Epithelial / LPF: NONE SEEN /HPF (ref <=5)
pH: <=5 (ref 5.0–8.0)

## 2018-07-27 LAB — HEMOGLOBIN A1C
Hgb A1c MFr Bld: 10.7 % of total Hgb — ABNORMAL HIGH (ref <5.7)
Mean Plasma Glucose: 260 (calc)
eAG (mmol/L): 14.4 (calc)

## 2018-07-27 LAB — LIPID PANEL
Cholesterol: 123 mg/dL (ref <200)
HDL: 49 mg/dL — ABNORMAL LOW (ref >=50)
LDL Cholesterol (Calc): 48 mg/dL (calc)
Non-HDL Cholesterol (Calc): 74 mg/dL (calc) (ref <130)
Total CHOL/HDL Ratio: 2.5 (calc) (ref <5.0)
Triglycerides: 182 mg/dL — ABNORMAL HIGH (ref <150)

## 2018-07-27 LAB — COMPLETE METABOLIC PANEL WITH GFR
AG Ratio: 1.4 (calc) (ref 1.0–2.5)
ALT: 27 U/L (ref 6–29)
AST: 27 U/L (ref 10–35)
Albumin: 4.1 g/dL (ref 3.6–5.1)
Alkaline phosphatase (APISO): 78 U/L (ref 37–153)
BUN/Creatinine Ratio: 18 (calc) (ref 6–22)
BUN: 22 mg/dL (ref 7–25)
CO2: 32 mmol/L (ref 20–32)
Calcium: 10 mg/dL (ref 8.6–10.4)
Chloride: 100 mmol/L (ref 98–110)
Creat: 1.24 mg/dL — ABNORMAL HIGH (ref 0.60–0.88)
GFR, Est African American: 46 mL/min/{1.73_m2} — ABNORMAL LOW (ref >=60)
GFR, Est Non African American: 39 mL/min/{1.73_m2} — ABNORMAL LOW (ref >=60)
Globulin: 2.9 g/dL (calc) (ref 1.9–3.7)
Glucose, Bld: 293 mg/dL — ABNORMAL HIGH (ref 65–99)
Potassium: 4.5 mmol/L (ref 3.5–5.3)
Sodium: 140 mmol/L (ref 135–146)
Total Bilirubin: 0.4 mg/dL (ref 0.2–1.2)
Total Protein: 7 g/dL (ref 6.1–8.1)

## 2018-07-27 LAB — CBC WITH DIFFERENTIAL/PLATELET
Absolute Monocytes: 801 cells/uL (ref 200–950)
Basophils Absolute: 70 cells/uL (ref 0–200)
Basophils Relative: 0.8 %
Eosinophils Absolute: 396 cells/uL (ref 15–500)
Eosinophils Relative: 4.5 %
HCT: 39.5 % (ref 35.0–45.0)
Hemoglobin: 12.9 g/dL (ref 11.7–15.5)
Lymphs Abs: 2728 cells/uL (ref 850–3900)
MCH: 32 pg (ref 27.0–33.0)
MCHC: 32.7 g/dL (ref 32.0–36.0)
MCV: 98 fL (ref 80.0–100.0)
MPV: 12.7 fL — ABNORMAL HIGH (ref 7.5–12.5)
Monocytes Relative: 9.1 %
Neutro Abs: 4805 cells/uL (ref 1500–7800)
Neutrophils Relative %: 54.6 %
Platelets: 240 10*3/uL (ref 140–400)
RBC: 4.03 10*6/uL (ref 3.80–5.10)
RDW: 11.1 % (ref 11.0–15.0)
Total Lymphocyte: 31 %
WBC: 8.8 10*3/uL (ref 3.8–10.8)

## 2018-07-27 LAB — TSH: TSH: 0.85 mIU/L (ref 0.40–4.50)

## 2018-07-27 LAB — MICROALBUMIN / CREATININE URINE RATIO
Creatinine, Urine: 143 mg/dL (ref 20–275)
Microalb Creat Ratio: 7 mcg/mg creat (ref <30)
Microalb, Ur: 1 mg/dL

## 2018-07-27 LAB — VITAMIN D 25 HYDROXY (VIT D DEFICIENCY, FRACTURES): Vit D, 25-Hydroxy: 74 ng/mL (ref 30–100)

## 2018-07-27 LAB — PTH, INTACT AND CALCIUM
Calcium: 10 mg/dL (ref 8.6–10.4)
PTH: 23 pg/mL (ref 14–64)

## 2018-07-27 LAB — MAGNESIUM: Magnesium: 2 mg/dL (ref 1.5–2.5)

## 2018-07-30 DIAGNOSIS — H35351 Cystoid macular degeneration, right eye: Secondary | ICD-10-CM | POA: Diagnosis not present

## 2018-07-30 DIAGNOSIS — H43811 Vitreous degeneration, right eye: Secondary | ICD-10-CM | POA: Diagnosis not present

## 2018-07-30 DIAGNOSIS — H353211 Exudative age-related macular degeneration, right eye, with active choroidal neovascularization: Secondary | ICD-10-CM | POA: Diagnosis not present

## 2018-07-30 DIAGNOSIS — E119 Type 2 diabetes mellitus without complications: Secondary | ICD-10-CM | POA: Diagnosis not present

## 2018-08-09 ENCOUNTER — Ambulatory Visit: Payer: Medicare Other

## 2018-08-20 ENCOUNTER — Telehealth: Payer: Self-pay | Admitting: Adult Health

## 2018-08-20 NOTE — Telephone Encounter (Signed)
Attempted to call patient to complete CCM follow up; multiple calls unanswered at this time. Will attempt at another time/day. Left voice mail reminded to call with any concerns.

## 2018-08-22 ENCOUNTER — Telehealth: Payer: Self-pay | Admitting: Adult Health

## 2018-08-22 DIAGNOSIS — M545 Low back pain: Secondary | ICD-10-CM | POA: Diagnosis not present

## 2018-08-22 DIAGNOSIS — E118 Type 2 diabetes mellitus with unspecified complications: Secondary | ICD-10-CM

## 2018-08-22 DIAGNOSIS — Z794 Long term (current) use of insulin: Secondary | ICD-10-CM

## 2018-08-22 DIAGNOSIS — I10 Essential (primary) hypertension: Secondary | ICD-10-CM

## 2018-08-22 DIAGNOSIS — Z789 Other specified health status: Secondary | ICD-10-CM

## 2018-08-22 DIAGNOSIS — E1169 Type 2 diabetes mellitus with other specified complication: Secondary | ICD-10-CM

## 2018-08-22 DIAGNOSIS — E785 Hyperlipidemia, unspecified: Secondary | ICD-10-CM

## 2018-08-22 NOTE — Telephone Encounter (Signed)
Spoke with patient's husband to attempt CCM; patient not avilable at this time. She will call back tomorrow at 3 PM or so. Requested she have glucose logs ready to review at that time. He expresses understanding and will pass along message.

## 2018-08-22 NOTE — Telephone Encounter (Signed)
-----   Message from Vicie Mutters, [place] sent at 08/21/2018  2:05 PM EDT ----- Regarding: FW: return call Contact: [phone]  ----- Message ----- From: Elenor Quinones, CMA Sent: 08/21/2018  10:33 AM EDT To: Vicie Mutters, PA-C Subject: return call                                    Per office note:            Patient said her blood sugars are good. She states she is returning your call.

## 2018-08-23 ENCOUNTER — Encounter: Payer: Self-pay | Admitting: Adult Health

## 2018-08-23 ENCOUNTER — Telehealth (INDEPENDENT_AMBULATORY_CARE_PROVIDER_SITE_OTHER): Payer: Medicare Other | Admitting: Adult Health

## 2018-08-23 DIAGNOSIS — Z794 Long term (current) use of insulin: Secondary | ICD-10-CM | POA: Diagnosis not present

## 2018-08-23 DIAGNOSIS — E118 Type 2 diabetes mellitus with unspecified complications: Secondary | ICD-10-CM | POA: Diagnosis not present

## 2018-08-23 DIAGNOSIS — Z789 Other specified health status: Secondary | ICD-10-CM

## 2018-08-23 NOTE — Telephone Encounter (Signed)
-----   Message from Vicie Mutters, [place] sent at 08/21/2018  2:05 PM EDT ----- Regarding: FW: return call Contact: [phone]  ----- Message ----- From: Elenor Quinones, CMA Sent: 08/21/2018  10:33 AM EDT To: Vicie Mutters, PA-C Subject: return call                                    Per office note:            Patient said her blood sugars are good. She states she is returning your call.

## 2018-08-23 NOTE — Telephone Encounter (Signed)
CCM telephone visit  Medications reviewed/reconciled: yes  Current Outpatient Medications (Endocrine & Metabolic):  Marland Kitchen  Insulin Isophane & Regular Human (NOVOLIN 70/30 FLEXPEN) (70-30) 100 UNIT/ML PEN, Take 10 units with breakfast & take 5 units with dinner or as directed  Current Outpatient Medications (Cardiovascular):  .  bisoprolol-hydrochlorothiazide (ZIAC) 5-6.25 MG tablet, Take 1/2 to 1 Tablet daily as directed for BP .  rosuvastatin (CRESTOR) 20 MG tablet, Take 1 tablet Daily for Cholesterol   Current Outpatient Medications (Analgesics):  .  aspirin 81 MG tablet, Take 81 mg by mouth daily.  Current Outpatient Medications (Hematological):  .  vitamin B-12 (CYANOCOBALAMIN) 1000 MCG tablet, Take 1,000 mcg by mouth daily.  Current Outpatient Medications (Other):  Marland Kitchen  ACCU-CHEK AVIVA PLUS test strip, Test 3 times a day .  Apple Cider Vinegar 188 MG CAPS, Take by mouth. .  ASSURE COMFORT LANCETS 30G MISC, Check blood sugar 3 times daily-DX-E11.22 .  Blood Glucose Calibration (ACCU-CHEK AVIVA) SOLN, Check blood sugar 3 times daily-DX-E11.22 .  Blood Glucose Monitoring Suppl (ACCU-CHEK AVIVA PLUS) w/Device KIT, Check Blood Sugar 4 x /day .  Blood Glucose Monitoring Suppl (ACCU-CHEK AVIVA) device, Check blood sugar 3 times a day.  DX-E11.22 .  Cholecalciferol (VITAMIN D3) 2000 UNITS TABS, Take 1 capsule by mouth 2 (two) times daily. .  Cinnamon 500 MG TABS, Take by mouth 2 (two) times daily.  .  Continuous Blood Gluc Sensor MISC, Check blood sugar 3 times a day- Dx: E11.8/Z79.4 .  dorzolamide-timolol (COSOPT) 22.3-6.8 MG/ML ophthalmic solution,  .  EASY COMFORT INSULIN SYRINGE 30G X 1/2" 0.5 ML MISC,  .  latanoprost (XALATAN) 0.005 % ophthalmic solution,  .  LOTEMAX 0.5 % GEL,  .  Magnesium 500 MG CAPS, Take 250 mg by mouth daily.  .  Multiple Vitamins-Minerals (HAIR/SKIN/NAILS) TABS, Take by mouth 2 (two) times daily. Marland Kitchen  rOPINIRole (REQUIP) 3 MG tablet, TAKE 2 TABLETS BY MOUTH AT  BEDTIME FOR RESTLESSNESS .  senna-docusate (SENOKOT-S) 8.6-50 MG tablet, Take 1 tablet by mouth daily. .  vitamin C (ASCORBIC ACID) 500 MG tablet, Take 500 mg by mouth daily.  Medication adherence: yes Any Side effects?no Refills needed:no  Health Logs:  date fasting AM insulin Lunch  L insulin PM BGL PM insulin  4/24 131 18 224 18 161 12  4/25 125 12 skipped 0 67 14  4/26 144 18 skipped 0 145 18  4/27 68 0 skipped 0 284 18  4/28 180 18 skipped 0 142 18  4/29 157 18 skipped 0 167 18    Goals Reviewed:  Goals    . HEMOGLOBIN A1C < 8.0     Continue to keep track of your food and insulin Suggest checking your sugar BEFORE breakfast, luch & dinner and bring that in with you Continue to check your sugars regularly and  BRING IN YOUR LOG    Insulin regimen (novolin 70/30) Breakfast: 15 units with breakfast Lunch:  take 5 units if glucose is less than 200  take 7 units if 200-250 Take 10 units if 300+  Dinner:  take 7 units if <200 Take 10 units if 200-300 Take 12 units if 300+ - be careful with this - remember low blood sugar is more dangerous than high  Continue calling with reports of glucose if unusually low or high  STOP processed biscuits, sweets, white rice, crackers, fruit juice, cereal  A LOW SUGAR IS MORE DANGEROUS THAN A HIGH SUGAR    . LDL CALC <  70     Take rosuvastatin daily in the evening for cholesterol        Any patient concerns?:  Glycemic control  Plan: Continue to skip insulin in AM if fasting glucose <100, check BGL at lunch if low in AM, if PM glucose <100, take 12-14 units rather than full 18 units  Reminded low glucose more dangerous than high, always monitor glucose closely, call if unsure    Time Spent: [place], NP

## 2018-08-27 ENCOUNTER — Other Ambulatory Visit: Payer: Self-pay

## 2018-08-27 DIAGNOSIS — Z1211 Encounter for screening for malignant neoplasm of colon: Secondary | ICD-10-CM

## 2018-08-27 LAB — POC HEMOCCULT BLD/STL (HOME/3-CARD/SCREEN)
Card #2 Fecal Occult Blod, POC: NEGATIVE
Card #3 Fecal Occult Blood, POC: NEGATIVE
Fecal Occult Blood, POC: NEGATIVE

## 2018-08-27 NOTE — Progress Notes (Signed)
Assessment and Plan:  Type 2 diabetes mellitus with hyperglycemia, with long-term current use of insulin (HCC) She is doing well with 0-12-18 unit BID simple sliding scale   need to continue increasing insulin though will proceed slowly in consideration of her age, historically brittle/labile glucose values, very limited health literacy  Insulin regimen Breakfast: 0 units if <100, 12 units if <130, 18 units if 130+ with breakfast  (only if skips breakfast insulin) Lunch: take 5 units if glucose is less than 200, take 7 units if 200-250, Take 10 units if 300+  Dinner: take 12 units if light dinner, take 18 units if large dinner  Continue calling with reports of glucose if unusually low or high  Discussed low blood sugar is more dangerous than high, continue with close monitoring and glucose log papers provided and plan of care reviewed  Hypoglycemia information provided and reviewed STOP processed carbs, fruit juice, cereal  Will follow up in 1 month or sooner if needed  Essential hypertension Continue medications, near goal today; no med adjustment as typically at goal in office Monitor blood pressure at home; call if consistently over 140/80 Continue DASH diet.   Reminder to go to the ER if any CP, SOB, nausea, dizziness, severe HA, changes vision/speech, left arm numbness and tingling and jaw pain.   Comprehensive care plan:  Patient/caregiver was given comprehensive care plan We will continue to monitor these goals every 3 months with an office visit and every month by a telephone call  Patient can contact the office any time with the phone number and get to a provider via the answering service or they can use Mychart.   Verbal permission was received from the patient to review comprehensive care management, they understand they have the right to stop CCM services at any time.   The patient is self managing medications at home.  Medications were reviewed with the  patient today as well as adherence and potential interactions.   The patient does not need home health services at this time.   Further disposition pending results of labs. Discussed med's effects and SE's.   Over 30 minutes of exam, counseling, chart review, and critical decision making was performed.   Future Appointments  Date Time Provider [place]  09/25/2018  1:50 PM [facility] MM 3 [facility]  10/01/2018 10:30 AM Liane Comber, NP [facility] None  10/29/2018 10:45 AM Liane Comber, NP [facility] None  01/30/2019 10:30 AM [name], MD [facility] None  08/30/2019 10:00 AM [name], MD [facility] None    ------------------------------------------------------------------------------------------------------------------   HPI BP (!) 146/68   Pulse 64   Temp (!) 97.5 F (36.4 C)   Ht 5' 4"  (1.626 m)   Wt 125 lb 6.4 oz (56.9 kg)   SpO2 98%   BMI 21.52 kg/m   85 y.o.female, enrolled in CCM, presents  for 1 month follow up on T2DM after recent transition from lantus and sliding scale humulog to Novolin 70/30 BID due to poor compliance with sliding scale attributed to limited health literacy and anxiety with complex regimen; she is no longer on metformin due to renal functions. She additionally saw Dr. Buddy Duty (endocrinology) but after visit reported she preferred to have [place] manage in office.   Glucose log for last 1 week reviewed today - she is somewhat inconsistent with dose of insulin that she is taking, taking lower amount than was recommended   For dates 4/29- 5/5  Date Fasting AM insulin prelunch  Lunch insulin Pre-dinner glucose PM insulin  4/29 84 0 204 18 157 18  4/30 157 18 -  0 135 18  5/1 169 18 91 0 132 18  5/2 138 18 131 0 271 18  5/3 85 12 - 0 251 18  5/4 78 0 - 0 126 12     Reviewed meals - she she is doing better with meal choices eating at home, though she has switched to whole grain thin-sliced bread (low carb, high fiber). Overall  she does do better when eating home prepared foods.   She is improvement from previous 500+ glucose but recently doing well with following plan Reviewed increasing insulin further with simple sliding scale while reminding her that low glucose is more dangerous than high, particularly considering her age.  She denies waking up at night with cold sweats, shaking, or other hypoglycemic symptoms.       Lab Results  Component Value Date   GFRNONAA 15 (L) 07/26/2018   Lab Results  Component Value Date   CREATININE 1.24 (H) 07/26/2018   A1C goal is <8% due to her age Lab Results  Component Value Date   HGBA1C 10.7 (H) 07/26/2018    BMI is Body mass index is 21.52 kg/m. Wt Readings from Last 3 Encounters:  08/29/18 125 lb 6.4 oz (56.9 kg)  07/26/18 122 lb 3.2 oz (55.4 kg)  06/25/18 120 lb (54.4 kg)   Her blood pressure has been controlled at home, today their BP is BP: (!) 146/68  She does workout. She denies chest pain, shortness of breath, dizziness.   Past Medical History:  Diagnosis Date  . Arthritis   . Chronic back pain   . Diabetic neuropathy ([place])   . Hypercholesteremia   . Hypertension   . Liver lesion   . Neuropathy   . Type II or unspecified type diabetes mellitus without mention of complication, not stated as uncontrolled   . Vitamin D deficiency   . Wears glasses      Allergies  Allergen Reactions  . Morphine And Related Other (See Comments)    fuzzy feeling    Current Outpatient Medications on File Prior to Visit  Medication Sig  . ACCU-CHEK AVIVA PLUS test strip Test 3 times a day  . Apple Cider Vinegar 188 MG CAPS Take by mouth.  Marland Kitchen aspirin 81 MG tablet Take 81 mg by mouth daily.  . ASSURE COMFORT LANCETS 30G MISC Check blood sugar 3 times daily-DX-E11.22  . bisoprolol-hydrochlorothiazide (ZIAC) 5-6.25 MG tablet Take 1/2 to 1 Tablet daily as directed for BP  . Blood Glucose Calibration (ACCU-CHEK AVIVA) SOLN Check blood sugar 3 times daily-DX-E11.22   . Blood Glucose Monitoring Suppl (ACCU-CHEK AVIVA PLUS) w/Device KIT Check Blood Sugar 4 x /day  . Blood Glucose Monitoring Suppl (ACCU-CHEK AVIVA) device Check blood sugar 3 times a day.  DX-E11.22  . Cholecalciferol (VITAMIN D3) 2000 UNITS TABS Take 1 capsule by mouth 2 (two) times daily.  . Cinnamon 500 MG TABS Take by mouth 2 (two) times daily.   . Continuous Blood Gluc Sensor MISC Check blood sugar 3 times a day- Dx: E11.8/Z79.4  . dorzolamide-timolol (COSOPT) 22.3-6.8 MG/ML ophthalmic solution   . EASY COMFORT INSULIN SYRINGE 30G X 1/2" 0.5 ML MISC   . Insulin Isophane & Regular Human (NOVOLIN 70/30 FLEXPEN) (70-30) 100 UNIT/ML PEN Take 10 units with breakfast & take 5 units with dinner or as directed  . latanoprost (XALATAN) 0.005 % ophthalmic solution   .  LOTEMAX 0.5 % GEL   . Magnesium 500 MG CAPS Take 250 mg by mouth daily.   . Multiple Vitamins-Minerals (HAIR/SKIN/NAILS) TABS Take by mouth 2 (two) times daily.  Marland Kitchen rOPINIRole (REQUIP) 3 MG tablet TAKE 2 TABLETS BY MOUTH AT BEDTIME FOR RESTLESSNESS  . rosuvastatin (CRESTOR) 20 MG tablet Take 1 tablet Daily for Cholesterol  . senna-docusate (SENOKOT-S) 8.6-50 MG tablet Take 1 tablet by mouth daily.  . vitamin B-12 (CYANOCOBALAMIN) 1000 MCG tablet Take 1,000 mcg by mouth daily.  . vitamin C (ASCORBIC ACID) 500 MG tablet Take 500 mg by mouth daily.   No current facility-administered medications on file prior to visit.     ROS: all negative except above.   Physical Exam:  BP (!) 146/68   Pulse 64   Temp (!) 97.5 F (36.4 C)   Ht 5' 4"  (1.626 m)   Wt 125 lb 6.4 oz (56.9 kg)   SpO2 98%   BMI 21.52 kg/m   General Appearance: Well nourished, in no apparent distress. Eyes: PERRLA, conjunctiva no swelling or erythema Sinuses: No Frontal/maxillary tenderness ENT/Mouth: Hearing normal.  Neck: Supple, thyroid normal.  Respiratory: Respiratory effort normal, BS equal bilaterally without rales, rhonchi, wheezing or stridor.   Cardio: RRR with no MRGs. Brisk peripheral pulses without edema.  Abdomen: Soft, + BS.  Non tender, no guarding, rebound, hernias, masses. Lymphatics: Non tender without lymphadenopathy.  Musculoskeletal: Symmetrical strength, normal gait.  Skin: Warm, dry without rashes, lesions, ecchymosis.  Neuro: Cranial nerves intact. Normal muscle tone, no cerebellar symptoms. Psych: Awake and oriented X 3, normal affect, Insight and Judgment appropriate.     Izora Ribas, NP 11:33 AM [name]

## 2018-08-28 DIAGNOSIS — Z1211 Encounter for screening for malignant neoplasm of colon: Secondary | ICD-10-CM | POA: Diagnosis not present

## 2018-08-29 ENCOUNTER — Encounter: Payer: Self-pay | Admitting: Adult Health

## 2018-08-29 ENCOUNTER — Ambulatory Visit (INDEPENDENT_AMBULATORY_CARE_PROVIDER_SITE_OTHER): Payer: Medicare Other | Admitting: Adult Health

## 2018-08-29 ENCOUNTER — Ambulatory Visit: Payer: Self-pay | Admitting: Adult Health

## 2018-08-29 ENCOUNTER — Other Ambulatory Visit: Payer: Self-pay

## 2018-08-29 VITALS — BP 146/68 | HR 64 | Temp 97.5°F | Ht 64.0 in | Wt 125.4 lb

## 2018-08-29 DIAGNOSIS — Z794 Long term (current) use of insulin: Secondary | ICD-10-CM

## 2018-08-29 DIAGNOSIS — E118 Type 2 diabetes mellitus with unspecified complications: Secondary | ICD-10-CM | POA: Diagnosis not present

## 2018-08-29 DIAGNOSIS — Z789 Other specified health status: Secondary | ICD-10-CM

## 2018-08-29 DIAGNOSIS — I10 Essential (primary) hypertension: Secondary | ICD-10-CM | POA: Diagnosis not present

## 2018-08-29 NOTE — Patient Instructions (Signed)
Goals    . HEMOGLOBIN A1C < 8.0     Continue to keep track of your food and insulin Suggest checking your sugar BEFORE breakfast, luch & dinner and bring that in with you Continue to check your sugars regularly and  BRING IN YOUR LOG    Insulin regimen (novolin 70/30) Breakfast: 18 units with breakfast unless blood sugar low, then skip and check sugar before lunch  Lunch:  take 5 units if glucose is less than 200  take 7 units if 200-250 Take 10 units if 300+  Dinner:  take 12 if blood sugar low or eating a light dinner Take 18 units if blood sugar normal/high or having large dinner  Continue calling with reports of glucose if unusually low or high  STOP processed biscuits, sweets, white rice, crackers, fruit juice, cereal  A LOW SUGAR IS MORE DANGEROUS THAN A HIGH SUGAR    . LDL CALC < 70     Take rosuvastatin daily in the evening for cholesterol         Carbohydrate Counting for Diabetes Mellitus, Adult  Carbohydrate counting is a method of keeping track of how many carbohydrates you eat. Eating carbohydrates naturally increases the amount of sugar (glucose) in the blood. Counting how many carbohydrates you eat helps keep your blood glucose within normal limits, which helps you manage your diabetes (diabetes mellitus). It is important to know how many carbohydrates you can safely have in each meal. This is different for every person. A diet and nutrition specialist (registered dietitian) can help you make a meal plan and calculate how many carbohydrates you should have at each meal and snack. Carbohydrates are found in the following foods:  Grains, such as breads and cereals.  Dried beans and soy products.  Starchy vegetables, such as potatoes, peas, and corn.  Fruit and fruit juices.  Milk and yogurt.  Sweets and snack foods, such as cake, cookies, candy, chips, and soft drinks. How do I count carbohydrates? There are two ways to count carbohydrates in food. You  can use either of the methods or a combination of both. Reading "Nutrition Facts" on packaged food The "Nutrition Facts" list is included on the labels of almost all packaged foods and beverages in the [place] It includes:  The serving size.  Information about nutrients in each serving, including the grams (g) of carbohydrate per serving. To use the "Nutrition Facts":  Decide how many servings you will have.  Multiply the number of servings by the number of carbohydrates per serving.  The resulting number is the total amount of carbohydrates that you will be having. Learning standard serving sizes of other foods When you eat carbohydrate foods that are not packaged or do not include "Nutrition Facts" on the label, you need to measure the servings in order to count the amount of carbohydrates:  Measure the foods that you will eat with a food scale or measuring cup, if needed.  Decide how many standard-size servings you will eat.  Multiply the number of servings by 15. Most carbohydrate-rich foods have about 15 g of carbohydrates per serving. ? For example, if you eat 8 oz (170 g) of strawberries, you will have eaten 2 servings and 30 g of carbohydrates (2 servings x 15 g = 30 g).  For foods that have more than one food mixed, such as soups and casseroles, you must count the carbohydrates in each food that is included. The following list contains standard serving sizes of  common carbohydrate-rich foods. Each of these servings has about 15 g of carbohydrates:   hamburger bun or  English muffin.   oz (15 mL) syrup.   oz (14 g) jelly.  1 slice of bread.  1 six-inch tortilla.  3 oz (85 g) cooked rice or pasta.  4 oz (113 g) cooked dried beans.  4 oz (113 g) starchy vegetable, such as peas, corn, or potatoes.  4 oz (113 g) hot cereal.  4 oz (113 g) mashed potatoes or  of a large baked potato.  4 oz (113 g) canned or frozen fruit.  4 oz (120 mL) fruit juice.  4-6  crackers.  6 chicken nuggets.  6 oz (170 g) unsweetened dry cereal.  6 oz (170 g) plain fat-free yogurt or yogurt sweetened with artificial sweeteners.  8 oz (240 mL) milk.  8 oz (170 g) fresh fruit or one small piece of fruit.  24 oz (680 g) popped popcorn. Example of carbohydrate counting Sample meal  3 oz (85 g) chicken breast.  6 oz (170 g) brown rice.  4 oz (113 g) corn.  8 oz (240 mL) milk.  8 oz (170 g) strawberries with sugar-free whipped topping. Carbohydrate calculation 1. Identify the foods that contain carbohydrates: ? Rice. ? Corn. ? Milk. ? Strawberries. 2. Calculate how many servings you have of each food: ? 2 servings rice. ? 1 serving corn. ? 1 serving milk. ? 1 serving strawberries. 3. Multiply each number of servings by 15 g: ? 2 servings rice x 15 g = 30 g. ? 1 serving corn x 15 g = 15 g. ? 1 serving milk x 15 g = 15 g. ? 1 serving strawberries x 15 g = 15 g. 4. Add together all of the amounts to find the total grams of carbohydrates eaten: ? 30 g + 15 g + 15 g + 15 g = 75 g of carbohydrates total. Summary  Carbohydrate counting is a method of keeping track of how many carbohydrates you eat.  Eating carbohydrates naturally increases the amount of sugar (glucose) in the blood.  Counting how many carbohydrates you eat helps keep your blood glucose within normal limits, which helps you manage your diabetes.  A diet and nutrition specialist (registered dietitian) can help you make a meal plan and calculate how many carbohydrates you should have at each meal and snack. This information is not intended to replace advice given to you by your health care provider. Make sure you discuss any questions you have with your health care provider. Document Released: 04/11/2005 Document Revised: 10/19/2016 Document Reviewed: 09/23/2015 Elsevier Interactive Patient Education  2019 [employer] Inc[employer]     Preventing Hypoglycemia Hypoglycemia occurs when the  level of sugar (glucose) in the blood is too low. Hypoglycemia can happen in people who do or do not have diabetes (diabetes mellitus). It can develop quickly, and it can be a medical emergency. For most people with diabetes, a blood glucose level below 70 mg/dL (3.9 mmol/L) is considered hypoglycemia. Glucose is a type of sugar that provides the body's main source of energy. Certain hormones (insulin and glucagon) control the level of glucose in the blood. Insulin lowers blood glucose, and glucagon increases blood glucose. Hypoglycemia can result from having too much insulin in the bloodstream, or from not eating enough food that contains glucose. Your risk for hypoglycemia is higher:  If you take insulin or diabetes medicines to help lower your blood glucose or help your body make  more insulin.  If you skip or delay a meal or snack.  If you are ill.  During and after exercise. You can prevent hypoglycemia by working with your health care provider to adjust your meal plan as needed and by taking other precautions. How can hypoglycemia affect me? Mild symptoms Mild hypoglycemia may not cause any symptoms. If you do have symptoms, they may include:  Hunger.  Anxiety.  Sweating and feeling clammy.  Dizziness or feeling light-headed.  Sleepiness.  Nausea.  Increased heart rate.  Headache.  Blurry vision.  Irritability.  Tingling or numbness around the mouth, lips, or tongue.  A change in coordination.  Restless sleep. If mild hypoglycemia is not recognized and treated, it can quickly become moderate or severe hypoglycemia. Moderate symptoms Moderate hypoglycemia can cause:  Mental confusion and poor judgment.  Behavior changes.  Weakness.  Irregular heartbeat. Severe symptoms Severe hypoglycemia is a medical emergency. It can cause:  Fainting.  Seizures.  Loss of consciousness (coma).  Death. What nutrition changes can be made?  Work with your health care  provider or diet and nutrition specialist (dietitian) to make a healthy meal plan that is right for you. Follow your meal plan carefully.  Eat meals at regular times.  If recommended by your health care provider, have snacks between meals.  Donot skip or delay meals or snacks. You can be at risk for hypoglycemia if you are not getting enough carbohydrates. What lifestyle changes can be made?   Work closely with your health care provider to manage your blood glucose. Make sure you know: ? Your goal blood glucose levels. ? How and when to check your blood glucose. ? The symptoms of hypoglycemia. It is important to treat it right away to keep it from becoming severe.  Do not drink alcohol on an empty stomach.  When you are ill, check your blood glucose more often than usual. Follow your sick day plan whenever you cannot eat or drink normally. Make this plan in advance with your health care provider.  Always check your blood glucose before, during, and after exercise. How is this treated? This condition can often be treated by immediately eating or drinking something that contains sugar, such as:  Fruit juice, 4-6 oz (120-150 mL).  Regular (not diet) soda, 4-6 oz (120-150 mL).  Low-fat milk, 4 oz (120 mL).  Several pieces of hard candy.  Sugar or honey, 1 Tbsp (15 mL). Treating hypoglycemia if you have diabetes If you are alert and able to swallow safely, follow the 15:15 rule:  Take 15 grams of a rapid-acting carbohydrate. Talk with your health care provider about how much you should take.  Rapid-acting options include: ? Glucose pills (take 15 grams). ? 6-8 pieces of hard candy. ? 4-6 oz (120-150 mL) of fruit juice. ? 4-6 oz (120-150 mL) of regular (not diet) soda.  Check your blood glucose 15 minutes after you take the carbohydrate.  If the repeat blood glucose level is still at or below 70 mg/dL (3.9 mmol/L), take 15 grams of a carbohydrate again.  If your blood glucose  level does not increase above 70 mg/dL (3.9 mmol/L) after 3 tries, seek emergency medical care.  After your blood glucose level returns to normal, eat a meal or a snack within 1 hour. Treating severe hypoglycemia Severe hypoglycemia is when your blood glucose level is at or below 54 mg/dL (3 mmol/L). Severe hypoglycemia is a medical emergency. Get medical help right away. If you have  severe hypoglycemia and you cannot eat or drink, you may need an injection of glucagon. A family member or close friend should learn how to check your blood glucose and how to give you a glucagon injection. Ask your health care provider if you need to have an emergency glucagon injection kit available. Severe hypoglycemia may need to be treated in a hospital. The treatment may include getting glucose through an IV. You may also need treatment for the cause of your hypoglycemia. Where to find more information  American Diabetes Association: www.diabetes.[employer] of Diabetes and Digestive and Kidney Diseases: [employer] Contact a health care provider if:  You have problems keeping your blood glucose in your target range.  You have frequent episodes of hypoglycemia. Get help right away if:  You continue to have hypoglycemia symptoms after eating or drinking something containing glucose.  Your blood glucose level is at or below 54 mg/dL (3 mmol/L).  You faint.  You have a seizure. These symptoms may represent a serious problem that is an emergency. Do not wait to see if the symptoms will go away. Get medical help right away. Call your local emergency services (911 in the [place] Summary  Know the symptoms of hypoglycemia, and when you are at risk for it (such as during exercise or when you are sick). Check your blood glucose often when you are at risk for hypoglycemia.  Hypoglycemia can develop quickly, and it can be dangerous if it is not treated right away. If you have a history of  severe hypoglycemia, make sure you know how to use your glucagon injection kit.  Make sure you know how to treat hypoglycemia. Keep a carbohydrate snack available when you may be at risk for hypoglycemia. This information is not intended to replace advice given to you by your health care provider. Make sure you discuss any questions you have with your health care provider. Document Released: 12/07/2016 Document Revised: 10/02/2017 Document Reviewed: 12/07/2016 Elsevier Interactive Patient Education  2019 [employer].

## 2018-09-04 ENCOUNTER — Other Ambulatory Visit: Payer: Self-pay | Admitting: Orthopaedic Surgery

## 2018-09-04 DIAGNOSIS — H401111 Primary open-angle glaucoma, right eye, mild stage: Secondary | ICD-10-CM | POA: Diagnosis not present

## 2018-09-04 DIAGNOSIS — H401122 Primary open-angle glaucoma, left eye, moderate stage: Secondary | ICD-10-CM | POA: Diagnosis not present

## 2018-09-04 DIAGNOSIS — H04123 Dry eye syndrome of bilateral lacrimal glands: Secondary | ICD-10-CM | POA: Diagnosis not present

## 2018-09-04 DIAGNOSIS — H353231 Exudative age-related macular degeneration, bilateral, with active choroidal neovascularization: Secondary | ICD-10-CM | POA: Diagnosis not present

## 2018-09-04 DIAGNOSIS — Z85828 Personal history of other malignant neoplasm of skin: Secondary | ICD-10-CM | POA: Diagnosis not present

## 2018-09-04 DIAGNOSIS — L570 Actinic keratosis: Secondary | ICD-10-CM | POA: Diagnosis not present

## 2018-09-04 DIAGNOSIS — M47816 Spondylosis without myelopathy or radiculopathy, lumbar region: Secondary | ICD-10-CM

## 2018-09-04 DIAGNOSIS — Z961 Presence of intraocular lens: Secondary | ICD-10-CM | POA: Diagnosis not present

## 2018-09-04 LAB — HM DIABETES EYE EXAM

## 2018-09-10 ENCOUNTER — Ambulatory Visit
Admission: RE | Admit: 2018-09-10 | Discharge: 2018-09-10 | Disposition: A | Discharge status: Home / Self Care | Payer: Medicare Other | Source: Ambulatory Visit | Attending: Orthopaedic Surgery | Admitting: Orthopaedic Surgery

## 2018-09-10 ENCOUNTER — Other Ambulatory Visit: Payer: Self-pay

## 2018-09-10 DIAGNOSIS — M47816 Spondylosis without myelopathy or radiculopathy, lumbar region: Secondary | ICD-10-CM

## 2018-09-10 DIAGNOSIS — M545 Low back pain: Secondary | ICD-10-CM | POA: Diagnosis not present

## 2018-09-10 MED ORDER — METHYLPREDNISOLONE ACETATE 40 MG/ML INJ SUSP (RADIOLOG
120.0000 mg | Freq: Once | INTRAMUSCULAR | Status: AC
  Administered 2018-09-10: 120 mg via EPIDURAL

## 2018-09-10 MED ORDER — IOPAMIDOL (ISOVUE-M 200) INJECTION 41%
1.0000 mL | Freq: Once | INTRAMUSCULAR | Status: AC
  Administered 2018-09-10: 13:00:00 1 mL via EPIDURAL

## 2018-09-10 NOTE — Discharge Instructions (Signed)

## 2018-09-25 ENCOUNTER — Ambulatory Visit: Payer: Medicare Other

## 2018-09-25 DIAGNOSIS — H35351 Cystoid macular degeneration, right eye: Secondary | ICD-10-CM | POA: Diagnosis not present

## 2018-09-25 DIAGNOSIS — H43811 Vitreous degeneration, right eye: Secondary | ICD-10-CM | POA: Diagnosis not present

## 2018-09-25 DIAGNOSIS — H353211 Exudative age-related macular degeneration, right eye, with active choroidal neovascularization: Secondary | ICD-10-CM | POA: Diagnosis not present

## 2018-09-26 ENCOUNTER — Encounter: Payer: Self-pay | Admitting: Adult Health

## 2018-09-26 ENCOUNTER — Other Ambulatory Visit: Payer: Self-pay

## 2018-09-26 ENCOUNTER — Ambulatory Visit (INDEPENDENT_AMBULATORY_CARE_PROVIDER_SITE_OTHER): Payer: Medicare Other | Admitting: Adult Health

## 2018-09-26 VITALS — BP 130/68 | HR 59 | Temp 97.7°F | Ht 64.0 in | Wt 122.0 lb

## 2018-09-26 DIAGNOSIS — R350 Frequency of micturition: Secondary | ICD-10-CM | POA: Diagnosis not present

## 2018-09-26 DIAGNOSIS — R1030 Lower abdominal pain, unspecified: Secondary | ICD-10-CM

## 2018-09-26 DIAGNOSIS — K5900 Constipation, unspecified: Secondary | ICD-10-CM | POA: Diagnosis not present

## 2018-09-26 MED ORDER — LACTULOSE 10 GM/15ML PO SOLN: ORAL | 0 refills | Status: DC

## 2018-09-26 NOTE — Patient Instructions (Addendum)
  Miralax daily - drink LOTS of water - 4-5+ bottles daily   Can do suppository as needed to have a GOOD BM at least every 2-3 days  Can do miralax daily - need to drink lots of water with this  Can try OTC senokot medication daily (gentle stimulant)     Can try the lactulose for constipation Start out with 10mg  or 15 ml once a day, can go up to 30 ml twice a day as needed for constipation Please also make sure you are drinking a lot of water, up to 64-80 oz a day Try to increase your fiber such as whole graines and veggies You can also add a fiber supplement like Citracel or Benefiber, these do not cause gas and bloating and are safe to use.   Walking or moving 20 mins a day can helps as well  If you have severe AB pain, no BM for 3-4 days, or blood in your stool let us know.     Constipation, Adult Constipation is when a person has fewer bowel movements in a week than normal, has difficulty having a bowel movement, or has stools that are dry, hard, or larger than normal. Constipation may be caused by an underlying condition. It may become worse with age if a person takes certain medicines and does not take in enough fluids. Follow these instructions at home: Eating and drinking   Eat foods that have a lot of fiber, such as fresh fruits and vegetables, whole grains, and beans.  Limit foods that are high in fat, low in fiber, or overly processed, such as french fries, hamburgers, cookies, candies, and soda.  Drink enough fluid to keep your urine clear or pale yellow. General instructions  Exercise regularly or as told by your health care provider.  Go to the restroom when you have the urge to go. Do not hold it in.  Take over-the-counter and prescription medicines only as told by your health care provider. These include any fiber supplements.  Practice pelvic floor retraining exercises, such as deep breathing while relaxing the lower abdomen and pelvic floor relaxation during  bowel movements.  Watch your condition for any changes.  Keep all follow-up visits as told by your health care provider. This is important. Contact a health care provider if:  You have pain that gets worse.  You have a fever.  You do not have a bowel movement after 4 days.  You vomit.  You are not hungry.  You lose weight.  You are bleeding from the anus.  You have thin, pencil-like stools. Get help right away if:  You have a fever and your symptoms suddenly get worse.  You leak stool or have blood in your stool.  Your abdomen is bloated.  You have severe pain in your abdomen.  You feel dizzy or you faint. This information is not intended to replace advice given to you by your health care provider. Make sure you discuss any questions you have with your health care provider. Document Released: 01/08/2004 Document Revised: 10/30/2015 Document Reviewed: 09/30/2015 Elsevier Interactive Patient Education  2019 [employer].

## 2018-09-26 NOTE — Progress Notes (Signed)
Assessment and Plan:  [name] was seen today for gi problem.  Diagnoses and all orders for this visit:  Lower abdominal pain Check basic labs today, but suspect discomfort secondary to constipation She has normal CT from 2019 without evidence of diverticulosis on imaging or last colonoscopy - doubt diverticulitis Otherwise benign VS, history, no red flags Will recommend bowel regimen, follow up if pain not resolving and can proceed with imaging Please go to the ER if you have any severe AB pain, unable to hold down food/water, blood in stool or vomit, chest pain, shortness of breath, or any worsening symptoms.  -     CBC with Differential/Platelet -     COMPLETE METABOLIC PANEL WITH GFR -     Urinalysis w microscopic + reflex cultur  Constipation, unspecified constipation type Do miralax daily, add senokot, increase water intake to 5+ bottles daily, increase fiber and activity Can do suppositories if helpful Goal to have good BM at least every 2-3 days discussed Likely neuropathy contributing to slow transit -     lactulose (CHRONULAC) 10 GM/15ML solution; Take 15 ml (3 tsp) once or twice daily as needed for constipation.  Frequency of urination -     Urinalysis w microscopic + reflex cultur   Further disposition pending results of labs. Discussed med's effects and SE's.   Over 15 minutes of exam, counseling, chart review, and critical decision making was performed.   Future Appointments  Date Time Provider [place]  10/01/2018 10:30 AM Liane Comber, NP [facility] None  10/29/2018 11:00 AM Liane Comber, NP [facility] None  01/30/2019 10:30 AM [name], MD [facility] None  08/30/2019 10:00 AM [name], MD [facility] None    ------------------------------------------------------------------------------------------------------------------   HPI BP 130/68   Pulse (!) 59   Temp 97.7 F (36.5 C)   Ht '5\' 4"'$  (1.626 m)   Wt 122 lb (55.3 kg)   SpO2 96%    BMI 20.94 kg/m   85 y.o.female with T2DM with neuropathy, htn, CKD III, limited health literacy presents for evaluation of abdominal discomfort ongoing for "a few weeks" - she reports lower abdominal, constant, mild but sharp, 5/10, non-radiating. She denies aggravating or alleviating factors. She reports she is constipated, consistent with baseline. She reports urge for BM but has difficulty moving bowels or will produce very small amount of stool, has to strain.   Denies fever/chills, nausea, vomiting, denies diarrhea, blood in stool   She has tried miralax, linzess (only took 1 tab)- didn't help much, glycerine suppository (did help)  She reports urinary frequency, up every 2 hours last night with minimal output; reports urine has appeared darker intermittently, denies cloudiness, hematuria, dysuria.   Last colonoscopy in 2006, was normal, patient reports never abnormal in past No family hx of colon cancer  Had normal abd CT in 06/2017 without diverticulosis;  Past Medical History:  Diagnosis Date  . Arthritis   . Chronic back pain   . Diabetic neuropathy ([place])   . Hypercholesteremia   . Hypertension   . Liver lesion   . Neuropathy   . Type II or unspecified type diabetes mellitus without mention of complication, not stated as uncontrolled   . Vitamin D deficiency   . Wears glasses      Allergies  Allergen Reactions  . Morphine And Related Other (See Comments)    fuzzy feeling    Current Outpatient Medications on File Prior to Visit  Medication Sig  . ACCU-CHEK AVIVA PLUS test strip Test  3 times a day  . Apple Cider Vinegar 188 MG CAPS Take by mouth.  Marland Kitchen aspirin 81 MG tablet Take 81 mg by mouth daily.  . ASSURE COMFORT LANCETS 30G MISC Check blood sugar 3 times daily-DX-E11.22  . bisoprolol-hydrochlorothiazide (ZIAC) 5-6.25 MG tablet Take 1/2 to 1 Tablet daily as directed for BP  . Blood Glucose Calibration (ACCU-CHEK AVIVA) SOLN Check blood sugar 3 times daily-DX-E11.22   . Blood Glucose Monitoring Suppl (ACCU-CHEK AVIVA PLUS) w/Device KIT Check Blood Sugar 4 x /day  . Blood Glucose Monitoring Suppl (ACCU-CHEK AVIVA) device Check blood sugar 3 times a day.  DX-E11.22  . Cholecalciferol (VITAMIN D3) 2000 UNITS TABS Take 1 capsule by mouth 2 (two) times daily.  . Cinnamon 500 MG TABS Take by mouth 2 (two) times daily.   . Continuous Blood Gluc Sensor MISC Check blood sugar 3 times a day- Dx: E11.8/Z79.4  . dorzolamide-timolol (COSOPT) 22.3-6.8 MG/ML ophthalmic solution   . EASY COMFORT INSULIN SYRINGE 30G X 1/2" 0.5 ML MISC   . Insulin Isophane & Regular Human (NOVOLIN 70/30 FLEXPEN) (70-30) 100 UNIT/ML PEN Take 10 units with breakfast & take 5 units with dinner or as directed  . latanoprost (XALATAN) 0.005 % ophthalmic solution   . Multiple Vitamins-Minerals (HAIR/SKIN/NAILS) TABS Take by mouth 2 (two) times daily.  Marland Kitchen rOPINIRole (REQUIP) 3 MG tablet TAKE 2 TABLETS BY MOUTH AT BEDTIME FOR RESTLESSNESS  . rosuvastatin (CRESTOR) 20 MG tablet Take 1 tablet Daily for Cholesterol  . vitamin B-12 (CYANOCOBALAMIN) 1000 MCG tablet Take 1,000 mcg by mouth daily.  . vitamin C (ASCORBIC ACID) 500 MG tablet Take 500 mg by mouth daily.  Marland Kitchen LOTEMAX 0.5 % GEL   . Magnesium 500 MG CAPS Take 250 mg by mouth daily.   Marland Kitchen senna-docusate (SENOKOT-S) 8.6-50 MG tablet Take 1 tablet by mouth daily.   No current facility-administered medications on file prior to visit.     ROS: all negative except above.   Physical Exam:  BP 130/68   Pulse (!) 59   Temp 97.7 F (36.5 C)   Ht _0  (1.626 m)   Wt 122 lb (55.3 kg)   SpO2 96%   BMI 20.94 kg/m   General Appearance: Well nourished, in no apparent distress. Eyes: conjunctiva no swelling or erythema ENT/Mouth: No erythema, swelling, or exudate on post pharynx.  Tonsils not swollen or erythematous. Hearing normal.  Neck: Supple, thyroid normal.  Respiratory: Respiratory effort normal, BS equal bilaterally without rales, rhonchi,  wheezing or stridor.  Cardio: RRR with no MRGs. Brisk peripheral pulses without edema.  Abdomen: Soft, + BS.  Mild generalized tenderness, no guarding, rebound, hernias, palpable masses. Lymphatics: Non tender without lymphadenopathy.  Musculoskeletal: Full ROM, 5/5 strength, normal gait.  Skin: Warm, dry without rashes, lesions, ecchymosis.  Neuro: Cranial nerves intact. Normal muscle tone, no cerebellar symptoms. Sensation intact.  Psych: Awake and oriented X 3, normal affect, Insight and Judgment appropriate.     Izora Ribas, NP 11:10 AM [facility]

## 2018-09-27 ENCOUNTER — Other Ambulatory Visit: Payer: Self-pay | Admitting: Adult Health

## 2018-09-27 MED ORDER — AMOXICILLIN-POT CLAVULANATE 250-125 MG PO TABS: 1.0000 | ORAL_TABLET | Freq: Two times a day (BID) | ORAL | 0 refills | Status: DC

## 2018-09-28 LAB — URINALYSIS W MICROSCOPIC + REFLEX CULTURE
Bacteria, UA: NONE SEEN /HPF
Bilirubin Urine: NEGATIVE
Hyaline Cast: NONE SEEN /LPF
Ketones, ur: NEGATIVE
Nitrites, Initial: NEGATIVE
Specific Gravity, Urine: 1.017 (ref 1.001–1.03)
Squamous Epithelial / HPF: NONE SEEN /HPF (ref <=5)
WBC, UA: >=60 /HPF — AB (ref 0–5)
pH: <=5 (ref 5.0–8.0)

## 2018-09-28 LAB — URINE CULTURE
MICRO NUMBER:: 537102
SPECIMEN QUALITY:: ADEQUATE

## 2018-09-28 LAB — COMPLETE METABOLIC PANEL WITH GFR
AG Ratio: 1.3 (calc) (ref 1.0–2.5)
ALT: 13 U/L (ref 6–29)
AST: 15 U/L (ref 10–35)
Albumin: 3.9 g/dL (ref 3.6–5.1)
Alkaline phosphatase (APISO): 82 U/L (ref 37–153)
BUN/Creatinine Ratio: 20 (calc) (ref 6–22)
BUN: 26 mg/dL — ABNORMAL HIGH (ref 7–25)
CO2: 31 mmol/L (ref 20–32)
Calcium: 10 mg/dL (ref 8.6–10.4)
Chloride: 99 mmol/L (ref 98–110)
Creat: 1.3 mg/dL — ABNORMAL HIGH (ref 0.60–0.88)
GFR, Est African American: 43 mL/min/{1.73_m2} — ABNORMAL LOW (ref >=60)
GFR, Est Non African American: 37 mL/min/{1.73_m2} — ABNORMAL LOW (ref >=60)
Globulin: 3.1 g/dL (calc) (ref 1.9–3.7)
Glucose, Bld: 269 mg/dL — ABNORMAL HIGH (ref 65–99)
Potassium: 4.5 mmol/L (ref 3.5–5.3)
Sodium: 139 mmol/L (ref 135–146)
Total Bilirubin: 0.5 mg/dL (ref 0.2–1.2)
Total Protein: 7 g/dL (ref 6.1–8.1)

## 2018-09-28 LAB — CBC WITH DIFFERENTIAL/PLATELET
Absolute Monocytes: 1082 cells/uL — ABNORMAL HIGH (ref 200–950)
Basophils Absolute: 66 cells/uL (ref 0–200)
Basophils Relative: 0.4 %
Eosinophils Absolute: 246 cells/uL (ref 15–500)
Eosinophils Relative: 1.5 %
HCT: 39.5 % (ref 35.0–45.0)
Hemoglobin: 13.1 g/dL (ref 11.7–15.5)
Lymphs Abs: 2968 cells/uL (ref 850–3900)
MCH: 31.4 pg (ref 27.0–33.0)
MCHC: 33.2 g/dL (ref 32.0–36.0)
MCV: 94.7 fL (ref 80.0–100.0)
MPV: 12.9 fL — ABNORMAL HIGH (ref 7.5–12.5)
Monocytes Relative: 6.6 %
Neutro Abs: 12038 cells/uL — ABNORMAL HIGH (ref 1500–7800)
Neutrophils Relative %: 73.4 %
Platelets: 226 10*3/uL (ref 140–400)
RBC: 4.17 10*6/uL (ref 3.80–5.10)
RDW: 11.4 % (ref 11.0–15.0)
Total Lymphocyte: 18.1 %
WBC: 16.4 10*3/uL — ABNORMAL HIGH (ref 3.8–10.8)

## 2018-09-28 LAB — CULTURE INDICATED

## 2018-09-28 NOTE — Progress Notes (Signed)
Virtual Visit via Telephone Note  I connected with Sheralyn Boatman on 10/01/18 at 10:30 AM EDT by telephone and verified that I am speaking with the correct person using two identifiers.  Location: Patient: home Provider: [place] office    I discussed the limitations, risks, security and privacy concerns of performing an evaluation and management service by telephone and the availability of in person appointments. I also discussed with the patient that there may be a patient responsible charge related to this service. The patient expressed understanding and agreed to proceed.   I discussed the assessment and treatment plan with the patient. The patient was provided an opportunity to ask questions and all were answered. The patient agreed with the plan and demonstrated an understanding of the instructions.   The patient was advised to call back or seek an in-person evaluation if the symptoms worsen or if the condition fails to improve as anticipated.  I provided 28 minutes of non-face-to-face time during this encounter.   Izora Ribas, NP     Patient ID: JOHARI BENNETTS, female   DOB: 08-May-1929, 85 y.o.   MRN: [id number]  MEDICARE ANNUAL WELLNESS VISIT AND OV  Assessment:    Encounter for Medicare annual wellness exam  Essential hypertension - continue medications, DASH diet, exercise and monitor at home. Call if greater than 130/80.  Uncontrolled secondary diabetes mellitus with stage 3 CKD (GFR 30-59) (HCC) Discussed general issues about diabetes pathophysiology and management., Educational material distributed., Suggested low cholesterol diet., Encouraged aerobic exercise., Discussed foot care., Reminded to get yearly retinal exam. Increase AM insulin - take 18 units if <180; take 20 units if 180+ Continue PM insulin - 15 units if low (<130) or eating very light dinner, take 18 units otherwise Call if 300 insulin several days in a row for further adjustments Call with concerns  with hypoglycemia Continue with low sugar protein shakes  Type 2 diabetes with neuropathy [facility]) Discussed general issues about diabetes pathophysiology and management., Educational material distributed., Suggested low cholesterol diet., Encouraged aerobic exercise., Discussed foot care., Reminded to get yearly retinal exam.  Diabetic retinopathy associated with type 2 diabetes mellitus, macular edema presence unspecified, unspecified laterality, unspecified retinopathy severity ([place]) Discussed general issues about diabetes pathophysiology and management., Educational material distributed., Suggested low cholesterol diet., Encouraged aerobic exercise., Discussed foot care., Reminded to get yearly retinal exam.  Mixed hyperlipidemia -continue medications, check lipids, decrease fatty foods, increase activity.   Chronic pain syndrome monitor  Medication management -     Magnesium  Vitamin D deficiency Continue supplement for goal of 60-100  Atherosclerosis of aorta (HCC) Control blood pressure, cholesterol, glucose, increase exercise.   Medication management Monitor at routine [id number] -     CBC with Differential/Platelet -     COMPLETE METABOLIC PANEL WITH GFR -     Magnesium  Abdominal cramps hyosciamine discussed and sent in; follow up if not improving Please go to the ER if you have any severe AB pain, unable to hold down food/water, blood in stool or vomit, chest pain, shortness of breath, or any worsening symptoms.    Enrolled in CCM  Comprehensive care plan:  Patient/caregiver was given comprehensive care plan We will continue to monitor these goals every 3 months with an office visit and every month by a telephone call  Patient can contact the office any time with the phone number and get to a provider via the answering service or they can use Mychart.   Verbal  permission was received from the patient to review comprehensive care management, they understand they have  the right to stop CCM services at any time.   The patient is self managing medications at home.  Medications were reviewed with the patient today as well as adherence and potential interactions.   The patient does not need home health services at this time.   Future Appointments  Date Time Provider [place]  10/11/2018 10:00 AM [facility] NURSE [facility] None  10/29/2018 11:00 AM Liane Comber, NP [facility] None  01/30/2019 10:30 AM [name], MD [facility] None  08/30/2019 10:00 AM [name], MD [facility] None      Plan:   During the course of the visit the patient was educated and counseled about appropriate screening and preventive services including:    Pneumococcal vaccine   Influenza vaccine  Td vaccine  Screening electrocardiogram  Bone densitometry screening  Colorectal cancer screening  Diabetes screening  Glaucoma screening  Nutrition counseling   Advanced directives: requested   Subjective:   BRUNILDA EBLE  presents for Medicare Annual Wellness Visit and follow up for htn, DM with CKD/retinopathy/neuropathy, abdominal pain/constipation and for CCM.    Since last visit with abdominal pain, she reports she is having BMs with lactulose, has started augmentin with improvement in urinary frequency, but reports ongoing abdominal cramps.   BMI is Body mass index is 19.76 kg/m., she has not been working on diet and exercise, reports doesn't have the appetite she used to, works on housework, Haematologist, but otherwise doesn't intentionally exercise. Could walk at church. She will be starting daily premier protein shake (1 g sugar, 30 g protein).   Wt Readings from Last 3 Encounters:  10/01/18 115 lb 1.6 oz (52.2 kg)  09/26/18 122 lb (55.3 kg)  08/29/18 125 lb 6.4 oz (56.9 kg)   Patient is treated for HTN since 2000 & BP has been controlled at home.    Patient had a negative Cardiolite in 2009. Patient has had no complaints of any cardiac  type chest pain, palpitations, dyspnea/orthopnea/PND, dizziness, claudication, or dependent edema.  Also, the patient has history of T2_IDDM x 1996 and has been on insulin since 2000 and she has complications w/ CKD, retinopathy and Neuropathy,  she is on novolin 70/30 taking 15-18 units AM and PM, reports felt somewhat weak with glucose 85 but low glucose has been rare.     fasting glucose AM insulin Glucose prior to dinner PM insulin  6/1 107 18 units 85 15 units  6/2 198 18 units 327  18 units  6/3 118 18 units 354 18 units  6/4 135 18 units 202 18 units  6/5 193 18 units 317 18 units  6/6 117  18 units ? ?   6/7 214 18 units 189 18 units  6/8 178 18 units      She has had no symptoms of reactive hypoglycemia, diabetic polys or visual blurring. She does report tingling and occasional painful paresthesias of the feet.  Lab Results  Component Value Date   HGBA1C 10.7 (H) 07/26/2018   Lab Results  Component Value Date   GFRNONAA 37 (L) 09/26/2018     Current Outpatient Medications on File Prior to Visit  Medication Sig  . ACCU-CHEK AVIVA PLUS test strip Test 3 times a day  . amoxicillin-clavulanate (AUGMENTIN) 250-125 MG tablet Take 1 tablet by mouth 2 (two) times daily. For 7 days. Take with food.  Marland Kitchen Apple Cider Vinegar 188 MG CAPS  Take by mouth.  Marland Kitchen aspirin 81 MG tablet Take 81 mg by mouth daily.  . ASSURE COMFORT LANCETS 30G MISC Check blood sugar 3 times daily-DX-E11.22  . bisoprolol-hydrochlorothiazide (ZIAC) 5-6.25 MG tablet Take 1/2 to 1 Tablet daily as directed for BP  . Blood Glucose Calibration (ACCU-CHEK AVIVA) SOLN Check blood sugar 3 times daily-DX-E11.22  . Blood Glucose Monitoring Suppl (ACCU-CHEK AVIVA PLUS) w/Device KIT Check Blood Sugar 4 x /day  . Cholecalciferol (VITAMIN D3) 2000 UNITS TABS Take 1 capsule by mouth 2 (two) times daily.  . Cinnamon 500 MG TABS Take by mouth 2 (two) times daily.   . Continuous Blood Gluc Sensor MISC Check blood sugar 3 times a  day- Dx: E11.8/Z79.4  . dorzolamide-timolol (COSOPT) 22.3-6.8 MG/ML ophthalmic solution   . EASY COMFORT INSULIN SYRINGE 30G X 1/2" 0.5 ML MISC   . Insulin Isophane & Regular Human (NOVOLIN 70/30 FLEXPEN) (70-30) 100 UNIT/ML PEN Take 10 units with breakfast & take 5 units with dinner or as directed  . lactulose (CHRONULAC) 10 GM/15ML solution Take 15 ml (3 tsp) once or twice daily as needed for constipation.  Marland Kitchen latanoprost (XALATAN) 0.005 % ophthalmic solution   . LOTEMAX 0.5 % GEL   . Magnesium 400 MG TABS Take 1 tablet by mouth daily.  . Multiple Vitamins-Minerals (HAIR/SKIN/NAILS) TABS Take by mouth 2 (two) times daily.  Marland Kitchen rOPINIRole (REQUIP) 3 MG tablet TAKE 2 TABLETS BY MOUTH AT BEDTIME FOR RESTLESSNESS  . rosuvastatin (CRESTOR) 20 MG tablet Take 1 tablet Daily for Cholesterol  . vitamin B-12 (CYANOCOBALAMIN) 1000 MCG tablet Take 1,000 mcg by mouth daily.  . vitamin C (ASCORBIC ACID) 500 MG tablet Take 500 mg by mouth daily.  . Blood Glucose Monitoring Suppl (ACCU-CHEK AVIVA) device Check blood sugar 3 times a day.  DX-E11.22  . senna-docusate (SENOKOT-S) 8.6-50 MG tablet Take 1 tablet by mouth daily.   No current facility-administered medications on file prior to visit.    Patient Active Problem List   Diagnosis Date Noted  . Enrolled in chronic care management 01/02/2018  . Type 2 diabetes mellitus with complication, with long-term current use of insulin ([place]) 02/09/2017  . Atherosclerosis of aorta ([place]) 11/06/2016  . Diabetic retinopathy ([place]) 05/18/2015  . Encounter for Medicare annual wellness exam 05/18/2015  . Medication management 08/06/2013  . Neuropathy due to type 2 diabetes mellitus ([place]) 06/04/2013  . CKD stage 3 due to type 2 diabetes mellitus ([place])   . Hyperlipidemia associated with type 2 diabetes mellitus ([place])   . Vitamin D deficiency   . Hypertension 07/29/2012  . Chronic pain 07/29/2012    Screening Tests  Immunization History  Administered Date(s)  Administered  . DT 01/04/2011  . Influenza Split 01/23/2013  . Influenza, High Dose Seasonal PF 02/27/2014, 03/11/2015, 12/21/2015, 01/30/2017, 02/08/2018  . Pneumococcal Conjugate-13 02/27/2014  . Pneumococcal Polysaccharide-23 01/06/2012   Preventative care: Last colonoscopy: 2006 will not get another due to age [facility] 06/2017 needs to reschedule - patient will call  CXR 11/2015 DEXA 2007 - declines for now Echo 2014 Lumbar 2018 MRA 2014  Tetanus 2012 Flu 2019 Pneumo 2013 Prevnar 13 2015 Shingles vaccine declines  Names of Other Physician/Practitioners you currently use: 1. [facility] here for primary care 2. [facility], eye doctor, last visit in 02/12/2018 - report verified and abstracted 3. Dr Sissy Hoff, DDS, dentist, last visit 2020  Patient Care Team: [name], MD as PCP - General (Internal Medicine)  Warden Fillers, MD as Consulting Physician (Optometry) Zadie Rhine, Clent Demark, MD as Consulting Physician (Ophthalmology) Glenna Fellows, MD as Attending Physician (Neurosurgery) Melrose Nakayama, MD as Consulting Physician (Orthopedic Surgery) Milus Banister, MD as Attending Physician (Gastroenterology)    Allergies Allergies  Allergen Reactions  . Morphine And Related Other (See Comments)    fuzzy feeling    SURGICAL HISTORY She  has a past surgical history that includes Abdominal hysterectomy; Cesarean section; Back surgery (1993); Appendectomy; Colonoscopy; Eye surgery; Trigger finger release (01/10/2012); and Breast excisional biopsy (Right, 1967). FAMILY HISTORY Her family history includes Diabetes in her sister; Liver disease in her sister. SOCIAL HISTORY She  reports that she has never smoked. She has never used smokeless tobacco. She reports that she does not drink alcohol or use drugs.  MEDICARE WELLNESS OBJECTIVES: Physical activity: Current Exercise Habits: The patient does not participate in regular  exercise at present(housework and yardwork only), Exercise limited by: None identified Cardiac risk factors: Cardiac Risk Factors include: advanced age (>16mn, >>52women);diabetes mellitus;dyslipidemia;hypertension;sedentary lifestyle Depression/mood screen:   [facility] 10/01/2018  Decreased Interest 0  Down, Depressed, Hopeless 0  PHQ - 2 Score 0    ADLs:  In your present state of health, do you have any difficulty performing the following activities: 10/01/2018 07/25/2018  Hearing? N N  Vision? N N  Difficulty concentrating or making decisions? Y Y  Comment mild changes, denies getting lost, forgetting names, limiations in ADLs -  Walking or climbing stairs? N N  Dressing or bathing? N N  Doing errands, shopping? N [place] eating ? N -  Using the Toilet? N -  In the past six months, have you accidently leaked urine? N -  Do you have problems with loss of bowel control? N -  Managing your Medications? N -  Managing your Finances? N -  Housekeeping or managing your Housekeeping? N -  Some recent data might be hidden     Cognitive Testing  Alert? Yes  Normal Appearance?Yes  Oriented to person? Yes  Place? Yes   Time? Yes  Recall of three objects?  1/3  Can perform simple calculations? Yes  Displays appropriate judgment?Yes  Can read the correct time from a watch face?Yes  EOL planning: Does Patient Have a Medical Advance Directive?: No Would patient like information on creating a medical advance directive?: No - Patient declined(has papers, need to complete)   Objective:     Weight 115 lb 1.6 oz (52.2 kg).   General : Well sounding patient in no apparent distress HEENT: no hoarseness, no cough for duration of visit Lungs: speaks in complete sentences, no audible wheezing, no apparent distress Neurological: alert, oriented x 3 Psychiatric: pleasant, judgement appropriate     Medicare Attestation I have personally  reviewed: The patient's medical and social history Their use of alcohol, tobacco or illicit drugs Their current medications and supplements The patient's functional ability including ADLs,fall risks, home safety risks, cognitive, and hearing and visual impairment Diet and physical activities Evidence for depression or mood disorders  The patient's weight, height, BMI, and visual acuity have been recorded in the chart.  I have made referrals, counseling, and provided education to the patient based on review of the above and I have provided the patient with a written personalized care plan for preventive services.  Over 40 minutes of exam, counseling, chart review was performed.  AIzora Ribas NP   10/01/2018

## 2018-10-01 ENCOUNTER — Encounter: Payer: Self-pay | Admitting: Adult Health

## 2018-10-01 ENCOUNTER — Other Ambulatory Visit: Payer: Self-pay

## 2018-10-01 ENCOUNTER — Ambulatory Visit: Payer: Medicare Other | Admitting: Adult Health

## 2018-10-01 VITALS — Wt 115.1 lb

## 2018-10-01 DIAGNOSIS — Z789 Other specified health status: Secondary | ICD-10-CM | POA: Diagnosis not present

## 2018-10-01 DIAGNOSIS — R6889 Other general symptoms and signs: Secondary | ICD-10-CM

## 2018-10-01 DIAGNOSIS — G894 Chronic pain syndrome: Secondary | ICD-10-CM

## 2018-10-01 DIAGNOSIS — Z0000 Encounter for general adult medical examination without abnormal findings: Secondary | ICD-10-CM

## 2018-10-01 DIAGNOSIS — E11319 Type 2 diabetes mellitus with unspecified diabetic retinopathy without macular edema: Secondary | ICD-10-CM

## 2018-10-01 DIAGNOSIS — Z0001 Encounter for general adult medical examination with abnormal findings: Secondary | ICD-10-CM

## 2018-10-01 DIAGNOSIS — Z79899 Other long term (current) drug therapy: Secondary | ICD-10-CM

## 2018-10-01 DIAGNOSIS — Z794 Long term (current) use of insulin: Secondary | ICD-10-CM

## 2018-10-01 DIAGNOSIS — N183 Chronic kidney disease, stage 3 unspecified: Secondary | ICD-10-CM

## 2018-10-01 DIAGNOSIS — E118 Type 2 diabetes mellitus with unspecified complications: Secondary | ICD-10-CM | POA: Diagnosis not present

## 2018-10-01 DIAGNOSIS — E785 Hyperlipidemia, unspecified: Secondary | ICD-10-CM

## 2018-10-01 DIAGNOSIS — E1169 Type 2 diabetes mellitus with other specified complication: Secondary | ICD-10-CM

## 2018-10-01 DIAGNOSIS — E1122 Type 2 diabetes mellitus with diabetic chronic kidney disease: Secondary | ICD-10-CM

## 2018-10-01 DIAGNOSIS — I700 Atherosclerosis of aorta: Secondary | ICD-10-CM

## 2018-10-01 DIAGNOSIS — E559 Vitamin D deficiency, unspecified: Secondary | ICD-10-CM

## 2018-10-01 DIAGNOSIS — I10 Essential (primary) hypertension: Secondary | ICD-10-CM

## 2018-10-01 DIAGNOSIS — E1140 Type 2 diabetes mellitus with diabetic neuropathy, unspecified: Secondary | ICD-10-CM

## 2018-10-01 MED ORDER — HYOSCYAMINE SULFATE 0.125 MG SL SUBL: SUBLINGUAL_TABLET | SUBLINGUAL | 0 refills | Status: DC

## 2018-10-11 ENCOUNTER — Ambulatory Visit (INDEPENDENT_AMBULATORY_CARE_PROVIDER_SITE_OTHER): Payer: Medicare Other

## 2018-10-11 ENCOUNTER — Other Ambulatory Visit: Payer: Self-pay

## 2018-10-11 VITALS — BP 130/68 | HR 52 | Temp 97.5°F | Wt 122.0 lb

## 2018-10-11 DIAGNOSIS — R1030 Lower abdominal pain, unspecified: Secondary | ICD-10-CM

## 2018-10-11 DIAGNOSIS — R350 Frequency of micturition: Secondary | ICD-10-CM

## 2018-10-11 NOTE — Addendum Note (Signed)
Addended by: Chancy Hurter on: 10/11/2018 11:38 AM   Modules accepted: Level of Service

## 2018-10-11 NOTE — Progress Notes (Signed)
Patient presents to the office for a nurse visit to have labs drawn for possible infection and provide a urine specimen for previous UTI. Patient states that she has finished the Augmentin and is no longer having any symptoms at all. Vitals taken and recorded.

## 2018-10-12 LAB — URINALYSIS, ROUTINE W REFLEX MICROSCOPIC
Bacteria, UA: NONE SEEN /HPF
Bilirubin Urine: NEGATIVE
Glucose, UA: NEGATIVE
Hgb urine dipstick: NEGATIVE
Hyaline Cast: NONE SEEN /LPF
Ketones, ur: NEGATIVE
Nitrite: NEGATIVE
Protein, ur: NEGATIVE
Specific Gravity, Urine: 1.018 (ref 1.001–1.03)
pH: <=5 (ref 5.0–8.0)

## 2018-10-12 LAB — URINE CULTURE
MICRO NUMBER:: 584863
SPECIMEN QUALITY:: ADEQUATE

## 2018-10-12 LAB — CBC WITH DIFFERENTIAL/PLATELET
Absolute Monocytes: 837 cells/uL (ref 200–950)
Basophils Absolute: 73 cells/uL (ref 0–200)
Basophils Relative: 0.8 %
Eosinophils Absolute: 428 cells/uL (ref 15–500)
Eosinophils Relative: 4.7 %
HCT: 39 % (ref 35.0–45.0)
Hemoglobin: 12.8 g/dL (ref 11.7–15.5)
Lymphs Abs: 3504 cells/uL (ref 850–3900)
MCH: 31.3 pg (ref 27.0–33.0)
MCHC: 32.8 g/dL (ref 32.0–36.0)
MCV: 95.4 fL (ref 80.0–100.0)
MPV: 12.2 fL (ref 7.5–12.5)
Monocytes Relative: 9.2 %
Neutro Abs: 4259 cells/uL (ref 1500–7800)
Neutrophils Relative %: 46.8 %
Platelets: 247 10*3/uL (ref 140–400)
RBC: 4.09 10*6/uL (ref 3.80–5.10)
RDW: 11.8 % (ref 11.0–15.0)
Total Lymphocyte: 38.5 %
WBC: 9.1 10*3/uL (ref 3.8–10.8)

## 2018-10-15 ENCOUNTER — Telehealth: Payer: Self-pay

## 2018-10-15 NOTE — Telephone Encounter (Signed)
Would like to try Gabapentin again. Please send in rx.

## 2018-10-16 NOTE — Telephone Encounter (Signed)
Patient aware and will discuss at [facility] office visit.

## 2018-10-16 NOTE — Telephone Encounter (Signed)
Patient states that when she took this before it made her feel lightheaded in the mornings. Will follow up at next appt as recommended.

## 2018-10-16 NOTE — Telephone Encounter (Signed)
Left message to call back  

## 2018-10-29 ENCOUNTER — Ambulatory Visit: Payer: Medicare Other | Admitting: Adult Health

## 2018-11-08 NOTE — Progress Notes (Deleted)
FOLLOW UP  Assessment and Plan:   Hypertension At goal with current medications  Monitor blood pressure at home; patient to call if consistently greater than 150/90 (maintain higher goal due to hx of stenosis with dizziness) Continue DASH diet.   Reminder to go to the ER if any CP, SOB, nausea, dizziness, severe HA, changes vision/speech, left arm numbness and tingling and jaw pain.  Cholesterol Currently at typical goal of LDL <70 for diabetic; continue statin Continue low cholesterol diet and exercise.  Check lipid panel.   Diabetes with diabetic chronic kidney disease Continue medication: metformin,  lantus - discussed these parameters today again.  Continue diet and exercise.  Perform daily foot/skin check, notify office of any concerning changes.  Check A1C  BMI 20  Continue to recommend diet heavy in fruits and veggies and low in animal meats, cheeses, and dairy products, appropriate calorie intake Discuss exercise recommendations routinely Continue to monitor weight at each visit  Vitamin D Def At goal at last visit; continue supplementation to maintain goal of 60-100 Defer Vit D level to annual CPE   Comprehensive care plan:  Patient/caregiver was given comprehensive care plan We will continue to monitor these goals every 3 months with an office visit and every month by a telephone call  Patient can contact the office any time with the phone number and get to a provider via the answering service or they can use Mychart.   Verbal permission was received from the patient to review comprehensive care management, they understand they have the right to stop CCM services at any time.   The patient is self managing medications at home.  Medications were reviewed with the patient today as well as adherence and potential interactions.   The patient does not need home health services at this time.    Continue diet and meds as discussed. Further disposition pending  results of labs. Discussed med's effects and SE's.   Over 30 minutes of exam, counseling, chart review, and critical decision making was performed.   Future Appointments  Date Time Provider [place]  11/12/2018 11:00 AM Liane Comber, NP [facility] None  11/14/2018  4:50 PM [facility] 3 [facility]  12/04/2018 10:00 AM Garnet Sierras, NP [facility] None  01/30/2019 10:30 AM [name], MD [facility] None  08/30/2019 10:00 AM [name], MD [facility] None    ----------------------------------------------------------------------------------------------------------------------  HPI 85 y.o. female  presents for 3 month follow up on hypertension, cholesterol, diabetes, weight and vitamin D deficiency. She is enrolled in CCM due to poor diabetic control and limited health literacy to assist with insulin titration.   BMI is There is no height or weight on file to calculate BMI., she has been working on diet and exercise. Wt Readings from Last 3 Encounters:  10/11/18 122 lb (55.3 kg)  10/01/18 115 lb 1.6 oz (52.2 kg)  09/26/18 122 lb (55.3 kg)   Patient had a negative Cardiolite in 2009.  Her blood pressure has been controlled at home today their BP is    She does workout. She denies chest pain, shortness of breath, dizziness.   She is on cholesterol medication and denies myalgias. Her cholesterol is at goal of LDL <70, on goal with crestor. The cholesterol last visit was:   Lab Results  Component Value Date   CHOL 123 07/26/2018   HDL 49 (L) 07/26/2018   LDLCALC 48 07/26/2018   TRIG 182 (H) 07/26/2018   CHOLHDL 2.5 07/26/2018    She  has been working on diet and exercise for T2 diabetes, and denies foot ulcerations, increased appetite, nausea, paresthesia of the feet, polydipsia, polyuria, visual disturbances, vomiting and weight loss. She is off of metformin due to renal functions; she is taking novolin 70/30 ***  Date AM glucose AM insulin dose PM glucose  *** PM insulin dose                                              Last A1C in the office was:  Lab Results  Component Value Date   HGBA1C 10.7 (H) 07/26/2018   Lab Results  Component Value Date   GFRNONAA 37 (L) 09/26/2018   Patient is on Vitamin D supplement and at goal at last check:   Lab Results  Component Value Date   VD25OH 74 07/26/2018      Current Medications:  Current Outpatient Medications on File Prior to Visit  Medication Sig  . ACCU-CHEK AVIVA PLUS test strip Test 3 times a day  . amoxicillin-clavulanate (AUGMENTIN) 250-125 MG tablet Take 1 tablet by mouth 2 (two) times daily. For 7 days. Take with food.  Marland Kitchen Apple Cider Vinegar 188 MG CAPS Take by mouth.  Marland Kitchen aspirin 81 MG tablet Take 81 mg by mouth daily.  . ASSURE COMFORT LANCETS 30G MISC Check blood sugar 3 times daily-DX-E11.22  . bisoprolol-hydrochlorothiazide (ZIAC) 5-6.25 MG tablet Take 1/2 to 1 Tablet daily as directed for BP  . Blood Glucose Calibration (ACCU-CHEK AVIVA) SOLN Check blood sugar 3 times daily-DX-E11.22  . Blood Glucose Monitoring Suppl (ACCU-CHEK AVIVA PLUS) w/Device KIT Check Blood Sugar 4 x /day  . Blood Glucose Monitoring Suppl (ACCU-CHEK AVIVA) device Check blood sugar 3 times a day.  DX-E11.22  . Cholecalciferol (VITAMIN D3) 2000 UNITS TABS Take 1 capsule by mouth 2 (two) times daily.  . Cinnamon 500 MG TABS Take by mouth 2 (two) times daily.   . Continuous Blood Gluc Sensor MISC Check blood sugar 3 times a day- Dx: E11.8/Z79.4  . dorzolamide-timolol (COSOPT) 22.3-6.8 MG/ML ophthalmic solution   . EASY COMFORT INSULIN SYRINGE 30G X 1/2" 0.5 ML MISC   . hyoscyamine (LEVSIN SL) 0.125 MG SL tablet Take 1 to 2 tablets 3 to 4 x day if needed for Nausea, vomiting, cramping or diarrhea  . Insulin Isophane & Regular Human (NOVOLIN 70/30 FLEXPEN) (70-30) 100 UNIT/ML PEN Take 10 units with breakfast & take 5 units with dinner or as directed  . lactulose (CHRONULAC) 10 GM/15ML solution Take 15 ml  (3 tsp) once or twice daily as needed for constipation.  Marland Kitchen latanoprost (XALATAN) 0.005 % ophthalmic solution   . LOTEMAX 0.5 % GEL   . Magnesium 400 MG TABS Take 1 tablet by mouth daily.  . Multiple Vitamins-Minerals (HAIR/SKIN/NAILS) TABS Take by mouth 2 (two) times daily.  Marland Kitchen rOPINIRole (REQUIP) 3 MG tablet TAKE 2 TABLETS BY MOUTH AT BEDTIME FOR RESTLESSNESS  . rosuvastatin (CRESTOR) 20 MG tablet Take 1 tablet Daily for Cholesterol  . senna-docusate (SENOKOT-S) 8.6-50 MG tablet Take 1 tablet by mouth daily.  . vitamin B-12 (CYANOCOBALAMIN) 1000 MCG tablet Take 1,000 mcg by mouth daily.  . vitamin C (ASCORBIC ACID) 500 MG tablet Take 500 mg by mouth daily.   No current facility-administered medications on file prior to visit.      Allergies:  Allergies  Allergen Reactions  . Morphine  And Related Other (See Comments)    fuzzy feeling     Medical History:  Past Medical History:  Diagnosis Date  . Arthritis   . Chronic back pain   . Diabetic neuropathy ([place])   . Hypercholesteremia   . Hypertension   . Liver lesion   . Neuropathy   . Type II or unspecified type diabetes mellitus without mention of complication, not stated as uncontrolled   . Vitamin D deficiency   . Wears glasses    Family history- Reviewed and unchanged Social history- Reviewed and unchanged   Review of Systems:  Review of Systems  Constitutional: Negative for malaise/fatigue and weight loss.  HENT: Negative for hearing loss and tinnitus.   Eyes: Negative for blurred vision and double vision.  Respiratory: Negative for cough, shortness of breath and wheezing.   Cardiovascular: Negative for chest pain, palpitations, orthopnea, claudication and leg swelling.  Gastrointestinal: Negative for abdominal pain, blood in stool, constipation, diarrhea, heartburn, melena, nausea and vomiting.  Genitourinary: Negative.   Musculoskeletal: Negative for joint pain and myalgias.  Skin: Negative for rash.   Neurological: Negative for dizziness, tingling, sensory change, weakness and headaches.  Endo/Heme/Allergies: Negative for polydipsia.  Psychiatric/Behavioral: Negative.   All other systems reviewed and are negative.   Physical Exam: There were no vitals taken for this visit. Wt Readings from Last 3 Encounters:  10/11/18 122 lb (55.3 kg)  10/01/18 115 lb 1.6 oz (52.2 kg)  09/26/18 122 lb (55.3 kg)   General Appearance: Well nourished, in no apparent distress. Eyes: PERRLA, EOMs, conjunctiva no swelling or erythema Sinuses: No Frontal/maxillary tenderness ENT/Mouth: Ext aud canals clear, TMs without erythema, bulging. No erythema, swelling, or exudate on post pharynx.  Tonsils not swollen or erythematous. Hearing normal.  Neck: Supple, thyroid normal.  Respiratory: Respiratory effort normal, BS equal bilaterally without rales, rhonchi, wheezing or stridor.  Cardio: RRR with no MRGs. Brisk peripheral pulses without edema.  Abdomen: Soft, + BS.  Non tender, no guarding, rebound, hernias, masses. Lymphatics: Non tender without lymphadenopathy.  Musculoskeletal: Full ROM, 5/5 strength, Normal gait Skin: Warm, dry without rashes, lesions, ecchymosis.  Neuro: Cranial nerves intact. No cerebellar symptoms.  Psych: Awake and oriented X 3, normal affect, Insight and Judgment appropriate. Memory/comprehension somewhat poor.   Izora Ribas, NP 8:47 AM [facility]

## 2018-11-12 ENCOUNTER — Ambulatory Visit: Payer: Medicare Other | Admitting: Adult Health

## 2018-11-14 ENCOUNTER — Ambulatory Visit
Admission: RE | Admit: 2018-11-14 | Discharge: 2018-11-14 | Disposition: A | Discharge status: Home / Self Care | Payer: Medicare Other | Source: Ambulatory Visit | Attending: Internal Medicine | Admitting: Internal Medicine

## 2018-11-14 ENCOUNTER — Other Ambulatory Visit: Payer: Self-pay

## 2018-11-14 DIAGNOSIS — Z1231 Encounter for screening mammogram for malignant neoplasm of breast: Secondary | ICD-10-CM

## 2018-11-15 NOTE — Progress Notes (Signed)
FOLLOW UP  Assessment and Plan:   Hypertension At goal with current medications  Monitor blood pressure at home; patient to call if consistently greater than 150/90 (maintain higher goal due to hx of stenosis with dizziness) Continue DASH diet.   Reminder to go to the ER if any CP, SOB, nausea, dizziness, severe HA, changes vision/speech, left arm numbness and tingling and jaw pain.  Cholesterol Currently at typical goal of LDL <70 for diabetic; continue statin Continue low cholesterol diet and exercise.  Check lipid panel.   Diabetes with diabetic chronic kidney disease Continue medication: metformin,  lantus - discussed these parameters today again, reminded to increase AM insulin if fasting is 180+, call with any concers (has Dr. Idell Pickles # for after hours) Continue diet and exercise.  Perform daily foot/skin check, notify office of any concerning changes.  Check A1C  CKD III secondary to T2DM Increase fluids, avoid NSAIDS, monitor sugars, will monitor  BMI 20  Continue to recommend diet heavy in fruits and veggies and low in animal meats, cheeses, and dairy products, appropriate calorie intake Discuss exercise recommendations routinely Continue to monitor weight at each visit  Vitamin D Def At goal at last visit; continue supplementation to maintain goal of 60-100 Defer Vit D level to annual CPE   Comprehensive care plan:  Patient/caregiver was given comprehensive care plan We will continue to monitor these goals every 3 months with an office visit and every month by a telephone call  Patient can contact the office any time with the phone number and get to a provider via the answering service or they can use Mychart.   Verbal permission was received from the patient to review comprehensive care management, they understand they have the right to stop CCM services at any time.   The patient is self managing medications at home.  Medications were reviewed with the  patient today as well as adherence and potential interactions.   The patient does not need home health services at this time.    Continue diet and meds as discussed. Further disposition pending results of labs. Discussed med's effects and SE's.   Over 30 minutes of exam, counseling, chart review, and critical decision making was performed.   Future Appointments  Date Time Provider [place]  12/04/2018 10:00 AM Garnet Sierras, NP [facility] None  01/30/2019 10:30 AM [name], MD [facility] None  08/30/2019 10:00 AM [name], MD [facility] None    ----------------------------------------------------------------------------------------------------------------------  HPI 85 y.o. female  presents for 3 month follow up on hypertension, cholesterol, diabetes, weight and vitamin D deficiency. She is enrolled in CCM due to poor diabetic control and limited health literacy to assist with insulin titration.   BMI is Body mass index is 21.46 kg/m., she has been working on diet but admits to not much exercise besides gardening and occasional walking.  Wt Readings from Last 3 Encounters:  11/19/18 125 lb (56.7 kg)  10/11/18 122 lb (55.3 kg)  10/01/18 115 lb 1.6 oz (52.2 kg)   Patient had a negative Cardiolite in 2009.  Her blood pressure has been controlled at home today their BP is BP: 136/66  She does workout. She denies chest pain, shortness of breath, dizziness.   She is on cholesterol medication 20 mg daily and denies myalgias. Her cholesterol is at goal of LDL <70, on goal with crestor. The cholesterol last visit was:   Lab Results  Component Value Date   CHOL 123 07/26/2018   HDL 49 (L)  07/26/2018   LDLCALC 48 07/26/2018   TRIG 182 (H) 07/26/2018   CHOLHDL 2.5 07/26/2018    She has been working on diet and exercise for T2 diabetes, and denies foot ulcerations, increased appetite, nausea, paresthesia of the feet, polydipsia, polyuria, visual disturbances,  vomiting and weight loss. She is off of metformin due to renal functions; she is taking novolin 70/30 15-20 units BID  Date AM glucose AM insulin dose PM glucose - before dinner PM insulin dose  7/20 -  n/a 324 18 units  7/21 215 18 units 304 18 units  7/22 230 18 units 297 18 units  7/23 217 18 units 428 - had ice cream 20 units  7/24 153 18 units 117 18 units  7/25 121 18 units 240  18 units  7/26 160 18 units 165 18 units   The week prior she had fasting 66 episode, has some shakiness, and overall lower glucose that week - skipped insulin or reduced to 15 units    Last A1C in the office was:  Lab Results  Component Value Date   HGBA1C 10.7 (H) 07/26/2018   Lab Results  Component Value Date   GFRNONAA 37 (L) 09/26/2018   Patient is on Vitamin D supplement and at goal at last check:   Lab Results  Component Value Date   VD25OH 74 07/26/2018      Current Medications:  Current Outpatient Medications on File Prior to Visit  Medication Sig  . ACCU-CHEK AVIVA PLUS test strip Test 3 times a day  . Apple Cider Vinegar 188 MG CAPS Take by mouth.  Marland Kitchen aspirin 81 MG tablet Take 81 mg by mouth daily.  . ASSURE COMFORT LANCETS 30G MISC Check blood sugar 3 times daily-DX-E11.22  . bisoprolol-hydrochlorothiazide (ZIAC) 5-6.25 MG tablet Take 1/2 to 1 Tablet daily as directed for BP  . Blood Glucose Calibration (ACCU-CHEK AVIVA) SOLN Check blood sugar 3 times daily-DX-E11.22  . Blood Glucose Monitoring Suppl (ACCU-CHEK AVIVA) device Check blood sugar 3 times a day.  DX-E11.22  . Cholecalciferol (VITAMIN D3) 2000 UNITS TABS Take 1 capsule by mouth 2 (two) times daily.  . Cinnamon 500 MG TABS Take by mouth 2 (two) times daily.   . Continuous Blood Gluc Sensor MISC Check blood sugar 3 times a day- Dx: E11.8/Z79.4  . dorzolamide-timolol (COSOPT) 22.3-6.8 MG/ML ophthalmic solution   . EASY COMFORT INSULIN SYRINGE 30G X 1/2" 0.5 ML MISC   . hyoscyamine (LEVSIN SL) 0.125 MG SL tablet Take 1 to 2  tablets 3 to 4 x day if needed for Nausea, vomiting, cramping or diarrhea  . lactulose (CHRONULAC) 10 GM/15ML solution Take 15 ml (3 tsp) once or twice daily as needed for constipation.  Marland Kitchen latanoprost (XALATAN) 0.005 % ophthalmic solution   . LOTEMAX 0.5 % GEL   . Magnesium 400 MG TABS Take 1 tablet by mouth daily.  . Multiple Vitamins-Minerals (HAIR/SKIN/NAILS) TABS Take by mouth 2 (two) times daily.  Marland Kitchen NOVOLIN 70/30 FLEXPEN RELION (70-30) 100 UNIT/ML PEN INJECT 10 UNITS SUBCUTANEOUSLY WITH BREAKFAST AND 5 UNITS WITH SUPPER AS  DIRECTED  . rOPINIRole (REQUIP) 3 MG tablet TAKE 2 TABLETS BY MOUTH AT BEDTIME FOR RESTLESSNESS  . rosuvastatin (CRESTOR) 20 MG tablet Take 1 tablet Daily for Cholesterol  . senna-docusate (SENOKOT-S) 8.6-50 MG tablet Take 1 tablet by mouth daily.  . vitamin B-12 (CYANOCOBALAMIN) 1000 MCG tablet Take 1,000 mcg by mouth daily.  . vitamin C (ASCORBIC ACID) 500 MG tablet Take 500 mg  by mouth daily.   No current facility-administered medications on file prior to visit.      Allergies:  Allergies  Allergen Reactions  . Morphine And Related Other (See Comments)    fuzzy feeling     Medical History:  Past Medical History:  Diagnosis Date  . Arthritis   . Chronic back pain   . Diabetic neuropathy ([place])   . Hypercholesteremia   . Hypertension   . Liver lesion   . Neuropathy   . Type II or unspecified type diabetes mellitus without mention of complication, not stated as uncontrolled   . Vitamin D deficiency   . Wears glasses    Family history- Reviewed and unchanged Social history- Reviewed and unchanged   Review of Systems:  Review of Systems  Constitutional: Negative for malaise/fatigue and weight loss.  HENT: Negative for hearing loss and tinnitus.   Eyes: Negative for blurred vision and double vision.  Respiratory: Negative for cough, shortness of breath and wheezing.   Cardiovascular: Negative for chest pain, palpitations, orthopnea, claudication  and leg swelling.  Gastrointestinal: Negative for abdominal pain, blood in stool, constipation, diarrhea, heartburn, melena, nausea and vomiting.  Genitourinary: Negative.   Musculoskeletal: Negative for joint pain and myalgias.  Skin: Negative for rash.  Neurological: Negative for dizziness, tingling, sensory change, weakness and headaches.  Endo/Heme/Allergies: Negative for polydipsia.  Psychiatric/Behavioral: Negative.   All other systems reviewed and are negative.   Physical Exam: BP 136/66   Pulse (!) 55   Temp (!) 97.3 F (36.3 C)   Wt 125 lb (56.7 kg)   SpO2 97%   BMI 21.46 kg/m  Wt Readings from Last 3 Encounters:  11/19/18 125 lb (56.7 kg)  10/11/18 122 lb (55.3 kg)  10/01/18 115 lb 1.6 oz (52.2 kg)   General Appearance: Well nourished, in no apparent distress. Eyes: PERRLA, EOMs, conjunctiva no swelling or erythema Sinuses: No Frontal/maxillary tenderness ENT/Mouth: Ext aud canals clear, TMs without erythema, bulging. No erythema, swelling, or exudate on post pharynx.  Tonsils not swollen or erythematous. Hearing normal.  Neck: Supple, thyroid normal.  Respiratory: Respiratory effort normal, BS equal bilaterally without rales, rhonchi, wheezing or stridor.  Cardio: RRR with no MRGs. Brisk peripheral pulses without edema.  Abdomen: Soft, + BS.  Non tender, no guarding, rebound, hernias, masses. Lymphatics: Non tender without lymphadenopathy.  Musculoskeletal: Full ROM, 5/5 strength, Normal gait Skin: Warm, dry without rashes, lesions, ecchymosis.  Neuro: Cranial nerves intact. No cerebellar symptoms.  Psych: Awake and oriented X 3, normal affect, Insight and Judgment appropriate. Memory/comprehension somewhat poor.   Izora Ribas, NP 11:21 AM [name]

## 2018-11-16 ENCOUNTER — Other Ambulatory Visit: Payer: Self-pay | Admitting: Internal Medicine

## 2018-11-16 DIAGNOSIS — E118 Type 2 diabetes mellitus with unspecified complications: Secondary | ICD-10-CM

## 2018-11-16 DIAGNOSIS — Z794 Long term (current) use of insulin: Secondary | ICD-10-CM

## 2018-11-19 ENCOUNTER — Ambulatory Visit (INDEPENDENT_AMBULATORY_CARE_PROVIDER_SITE_OTHER): Payer: Medicare Other | Admitting: Adult Health

## 2018-11-19 ENCOUNTER — Other Ambulatory Visit: Payer: Self-pay

## 2018-11-19 ENCOUNTER — Encounter: Payer: Self-pay | Admitting: Adult Health

## 2018-11-19 VITALS — BP 136/66 | HR 55 | Temp 97.3°F | Wt 125.0 lb

## 2018-11-19 DIAGNOSIS — E785 Hyperlipidemia, unspecified: Secondary | ICD-10-CM | POA: Diagnosis not present

## 2018-11-19 DIAGNOSIS — E1169 Type 2 diabetes mellitus with other specified complication: Secondary | ICD-10-CM

## 2018-11-19 DIAGNOSIS — Z79899 Other long term (current) drug therapy: Secondary | ICD-10-CM

## 2018-11-19 DIAGNOSIS — I10 Essential (primary) hypertension: Secondary | ICD-10-CM

## 2018-11-19 DIAGNOSIS — E1122 Type 2 diabetes mellitus with diabetic chronic kidney disease: Secondary | ICD-10-CM | POA: Diagnosis not present

## 2018-11-19 DIAGNOSIS — Z794 Long term (current) use of insulin: Secondary | ICD-10-CM

## 2018-11-19 DIAGNOSIS — Z789 Other specified health status: Secondary | ICD-10-CM | POA: Diagnosis not present

## 2018-11-19 DIAGNOSIS — N183 Chronic kidney disease, stage 3 unspecified: Secondary | ICD-10-CM

## 2018-11-19 DIAGNOSIS — E559 Vitamin D deficiency, unspecified: Secondary | ICD-10-CM

## 2018-11-19 DIAGNOSIS — E118 Type 2 diabetes mellitus with unspecified complications: Secondary | ICD-10-CM

## 2018-11-19 NOTE — Patient Instructions (Addendum)
Goals    . HEMOGLOBIN A1C < 8.0     Continue to keep track of your food and insulin Suggest checking your sugar BEFORE breakfast, luch & dinner and bring that in with you Continue to check your sugars regularly and  BRING IN YOUR LOG    Insulin regimen (novolin 70/30) Breakfast: 18 units with breakfast unless blood sugar low, then skip and check sugar before lunch, if 180+ take 20 units  Dinner:  take 15 if blood sugar low or eating a light dinner Take 18 units if blood sugar normal/high or having large dinner  Continue calling with reports of glucose if unusually low or high  STOP processed biscuits, sweets, white rice, crackers, fruit juice, cereal  A LOW SUGAR IS MORE DANGEROUS THAN A HIGH SUGAR    . LDL CALC < 70     Take rosuvastatin daily in the evening for cholesterol         [name] blood type is A positive      Bad carbs also include fruit juice, alcohol, and sweet tea. These are empty calories that do not signal to your brain that you are full.   Please remember the good carbs are still carbs which convert into sugar. So please measure them out no more than 1/2-1 cup of rice, oatmeal, pasta, and beans  Veggies are however free foods! Pile them on.   Not all fruit is created equal. Please see the list below, the fruit at the bottom is higher in sugars than the fruit at the top. Please avoid all dried fruits.

## 2018-11-20 DIAGNOSIS — H353211 Exudative age-related macular degeneration, right eye, with active choroidal neovascularization: Secondary | ICD-10-CM | POA: Diagnosis not present

## 2018-11-20 DIAGNOSIS — E119 Type 2 diabetes mellitus without complications: Secondary | ICD-10-CM | POA: Diagnosis not present

## 2018-11-20 DIAGNOSIS — H353222 Exudative age-related macular degeneration, left eye, with inactive choroidal neovascularization: Secondary | ICD-10-CM | POA: Diagnosis not present

## 2018-11-20 DIAGNOSIS — H353123 Nonexudative age-related macular degeneration, left eye, advanced atrophic without subfoveal involvement: Secondary | ICD-10-CM | POA: Diagnosis not present

## 2018-11-20 DIAGNOSIS — H35351 Cystoid macular degeneration, right eye: Secondary | ICD-10-CM | POA: Diagnosis not present

## 2018-11-20 LAB — LIPID PANEL
Cholesterol: 159 mg/dL (ref <200)
HDL: 50 mg/dL (ref >=50)
LDL Cholesterol (Calc): 74 mg/dL (calc)
Non-HDL Cholesterol (Calc): 109 mg/dL (calc) (ref <130)
Total CHOL/HDL Ratio: 3.2 (calc) (ref <5.0)
Triglycerides: 263 mg/dL — ABNORMAL HIGH (ref <150)

## 2018-11-20 LAB — COMPLETE METABOLIC PANEL WITH GFR
AG Ratio: 1.4 (calc) (ref 1.0–2.5)
ALT: 17 U/L (ref 6–29)
AST: 22 U/L (ref 10–35)
Albumin: 4.2 g/dL (ref 3.6–5.1)
Alkaline phosphatase (APISO): 74 U/L (ref 37–153)
BUN/Creatinine Ratio: 23 (calc) — ABNORMAL HIGH (ref 6–22)
BUN: 32 mg/dL — ABNORMAL HIGH (ref 7–25)
CO2: 27 mmol/L (ref 20–32)
Calcium: 9.9 mg/dL (ref 8.6–10.4)
Chloride: 100 mmol/L (ref 98–110)
Creat: 1.38 mg/dL — ABNORMAL HIGH (ref 0.60–0.88)
GFR, Est African American: 40 mL/min/{1.73_m2} — ABNORMAL LOW (ref >=60)
GFR, Est Non African American: 35 mL/min/{1.73_m2} — ABNORMAL LOW (ref >=60)
Globulin: 3 g/dL (calc) (ref 1.9–3.7)
Glucose, Bld: 136 mg/dL — ABNORMAL HIGH (ref 65–99)
Potassium: 4.5 mmol/L (ref 3.5–5.3)
Sodium: 139 mmol/L (ref 135–146)
Total Bilirubin: 0.3 mg/dL (ref 0.2–1.2)
Total Protein: 7.2 g/dL (ref 6.1–8.1)

## 2018-11-20 LAB — HEMOGLOBIN A1C
Hgb A1c MFr Bld: 8.9 % of total Hgb — ABNORMAL HIGH (ref <5.7)
Mean Plasma Glucose: 209 (calc)
eAG (mmol/L): 11.6 (calc)

## 2018-11-20 LAB — CBC WITH DIFFERENTIAL/PLATELET
Absolute Monocytes: 827 cells/uL (ref 200–950)
Basophils Absolute: 57 cells/uL (ref 0–200)
Basophils Relative: 0.6 %
Eosinophils Absolute: 390 cells/uL (ref 15–500)
Eosinophils Relative: 4.1 %
HCT: 37.5 % (ref 35.0–45.0)
Hemoglobin: 12.6 g/dL (ref 11.7–15.5)
Lymphs Abs: 3050 cells/uL (ref 850–3900)
MCH: 31.5 pg (ref 27.0–33.0)
MCHC: 33.6 g/dL (ref 32.0–36.0)
MCV: 93.8 fL (ref 80.0–100.0)
MPV: 12.1 fL (ref 7.5–12.5)
Monocytes Relative: 8.7 %
Neutro Abs: 5178 cells/uL (ref 1500–7800)
Neutrophils Relative %: 54.5 %
Platelets: 276 10*3/uL (ref 140–400)
RBC: 4 10*6/uL (ref 3.80–5.10)
RDW: 12.2 % (ref 11.0–15.0)
Total Lymphocyte: 32.1 %
WBC: 9.5 10*3/uL (ref 3.8–10.8)

## 2018-11-20 LAB — TSH: TSH: 0.96 mIU/L (ref 0.40–4.50)

## 2018-11-20 LAB — MAGNESIUM: Magnesium: 1.9 mg/dL (ref 1.5–2.5)

## 2018-12-04 ENCOUNTER — Ambulatory Visit: Payer: Medicare Other | Admitting: Adult Health Nurse Practitioner

## 2018-12-19 NOTE — Progress Notes (Addendum)
Patient ID: [name], female   DOB: Sep 19, 1929, 85 y.o.   MRN: [id number]  MEDICARE ANNUAL WELLNESS VISIT AND OV  Assessment:    Encounter for Medicare annual wellness exam  Essential hypertension - continue medications, DASH diet, exercise and monitor at home. Call if greater than 130/80. -     CBC with Differential/Platelet -     CMP/GFR -     TSH  Uncontrolled secondary diabetes mellitus with stage 3 CKD (GFR 30-59) (HCC) Discussed general issues about diabetes pathophysiology and management., Educational material distributed., Suggested low cholesterol diet., Encouraged aerobic exercise., Discussed foot care., Reminded to get yearly retinal exam. Recent glucose log improved; continue current 15-18 unit BID dosing, 20 units PRN for severe elevations (fasting 180+, or 300+ in PM) Follow up closer if needed or glucose concerns -     Hemoglobin A1c  Controlled type 2 diabetes with neuropathy ([place]) Discussed general issues about diabetes pathophysiology and management., Educational material distributed., Suggested low cholesterol diet., Encouraged aerobic exercise., Discussed foot care., Reminded to get yearly retinal exam. -     Hemoglobin A1c  Diabetic retinopathy associated with type 2 diabetes mellitus, macular edema presence unspecified, unspecified laterality, unspecified retinopathy severity ([place]) Discussed general issues about diabetes pathophysiology and management., Educational material distributed., Suggested low cholesterol diet., Encouraged aerobic exercise., Discussed foot care., Reminded to get yearly retinal exam. -     Hemoglobin A1c  Mixed hyperlipidemia -continue medications, check lipids, decrease fatty foods, increase activity.  -     Lipid panel  Chronic pain syndrome monitor  Medication management -     Magnesium  Vitamin D deficiency At goal at recent check; continue to recommend supplementation for goal of 60-100 Defer vitamin D  level  Atherosclerosis of aorta (HCC) Control blood pressure, cholesterol, glucose, increase exercise.  -     Lipid panel -     Hemoglobin A1c  Medication management -     CBC with Differential/Platelet -     COMPLETE METABOLIC PANEL WITH GFR -     Magnesium  Estrogen def She is interested in bone density; ordered to schedule at breast center   Dysthymia Trial low dose wellbutrin for energy and motivation  Lifestyle discussed: diet/exerise, sleep hygiene, stress management, hydration   Comprehensive care plan:  Patient/caregiver was given comprehensive care plan We will continue to monitor these goals every 3 months with an office visit and every month by a telephone call  Patient can contact the office any time with the phone number and get to a provider via the answering service or they can use Mychart.   Verbal permission was received from the patient to review comprehensive care management, they understand they have the right to stop CCM services at any time.   The patient is self managing medications at home.  Medications were reviewed with the patient today as well as adherence and potential interactions.   The patient does not need home health services at this time.   Future Appointments  Date Time Provider [place]  01/30/2019 10:30 AM [name], MD [facility] None  08/30/2019 10:00 AM [name], MD [facility] None      Plan:   During the course of the visit the patient was educated and counseled about appropriate screening and preventive services including:    Pneumococcal vaccine   Influenza vaccine  Td vaccine  Screening electrocardiogram  Bone densitometry screening  Colorectal cancer screening  Diabetes screening  Glaucoma screening  Nutrition counseling  Advanced directives: requested   Subjective:   [name]  presents for Medicare Annual Wellness Visit and follow up for HTN, chol, DM with  CKD/retinopathy/neuropathy.   She feels steady on feet in home but some difficulty on uneven ground, has cane and does well when she uses it.   She has a garden, helps gets some of the beans/garden, states she is not able to do as much as she use to, will rest often.   She reports lack of energy is a concern; some depressed mood and wonders if there is anything she can take for this.   She is upset that she will be missing her great grandsons birthday party; this is the first that she will not be able to attend.   BMI is Body mass index is 21.42 kg/m., she has been working on diet but hasn't been exercising, lack of energy and no safe walking around her house. She could go to church and walk in their gym, plans to go with husband or friend.  Wt Readings from Last 3 Encounters:  12/20/18 124 lb 12.8 oz (56.6 kg)  11/19/18 125 lb (56.7 kg)  10/11/18 122 lb (55.3 kg)   Patient is treated for HTN since 2000 & BP has been controlled at home. BP: 124/60  Patient had a negative Cardiolite in 2009. Patient has had no complaints of any cardiac type chest pain, palpitations, dyspnea/orthopnea/PND, dizziness, claudication, or dependent edema.  Hyperlipidemia is controlled with diet & meds. Patient denies myalgias or other med SE's. Last Lipids were at goal  Lab Results  Component Value Date   CHOL 159 11/19/2018   HDL 50 11/19/2018   LDLCALC 74 11/19/2018   TRIG 263 (H) 11/19/2018   CHOLHDL 3.2 11/19/2018   Also, the patient has history of T2_IDDM x 1996 and has been on insulin since 2000 and she has complications w/ CKD, retinopathy and Neuropathy she is on novolin 70/30 15-18 units BID, occasionally will take 20 units if very elevated   Fasting  AM insulin PM sugar PM insulin  8/22 78 15 units 81 15 units  8/23 114 18 units 116 18 units  8/24 160 ?  283 18 units  8/25 135 18 units 121 18 units  8/26 134 18 units 191 18 units  8/27 96 15 units           She has had no symptoms of  reactive hypoglycemia, diabetic polys or visual blurring. She does report tingling and occasional painful paresthesias of the feet. She takes requip for RLS, reports hemp oil helping with foot pain.  Lab Results  Component Value Date   HGBA1C 8.9 (H) 11/19/2018   Lab Results  Component Value Date   GFRNONAA 35 (L) 11/19/2018   Further, the patient also has history of Vitamin D Deficiency, on supplements.  Lab Results  Component Value Date   VD25OH 74 07/26/2018     Current Outpatient Medications on File Prior to Visit  Medication Sig  . ACCU-CHEK AVIVA PLUS test strip Test 3 times a day  . Apple Cider Vinegar 188 MG CAPS Take by mouth.  Marland Kitchen aspirin 81 MG tablet Take 81 mg by mouth daily.  . ASSURE COMFORT LANCETS 30G MISC Check blood sugar 3 times daily-DX-E11.22  . bisoprolol-hydrochlorothiazide (ZIAC) 5-6.25 MG tablet Take 1/2 to 1 Tablet daily as directed for BP  . Blood Glucose Calibration (ACCU-CHEK AVIVA) SOLN Check blood sugar 3 times daily-DX-E11.22  . Cholecalciferol (VITAMIN D3) 2000  UNITS TABS Take 1 capsule by mouth 2 (two) times daily.  . Cinnamon 500 MG TABS Take by mouth 2 (two) times daily.   . Continuous Blood Gluc Sensor MISC Check blood sugar 3 times a day- Dx: E11.8/Z79.4  . dorzolamide-timolol (COSOPT) 22.3-6.8 MG/ML ophthalmic solution   . EASY COMFORT INSULIN SYRINGE 30G X 1/2" 0.5 ML MISC   . hyoscyamine (LEVSIN SL) 0.125 MG SL tablet Take 1 to 2 tablets 3 to 4 x day if needed for Nausea, vomiting, cramping or diarrhea  . lactulose (CHRONULAC) 10 GM/15ML solution Take 15 ml (3 tsp) once or twice daily as needed for constipation.  Marland Kitchen latanoprost (XALATAN) 0.005 % ophthalmic solution   . LOTEMAX 0.5 % GEL   . Magnesium 400 MG TABS Take 1 tablet by mouth daily.  . Multiple Vitamins-Minerals (HAIR/SKIN/NAILS) TABS Take by mouth 2 (two) times daily.  Marland Kitchen NOVOLIN 70/30 FLEXPEN RELION (70-30) 100 UNIT/ML PEN INJECT 10 UNITS SUBCUTANEOUSLY WITH BREAKFAST AND 5 UNITS WITH  SUPPER AS  DIRECTED  . rOPINIRole (REQUIP) 3 MG tablet TAKE 2 TABLETS BY MOUTH AT BEDTIME FOR RESTLESSNESS  . rosuvastatin (CRESTOR) 20 MG tablet Take 1 tablet Daily for Cholesterol  . senna-docusate (SENOKOT-S) 8.6-50 MG tablet Take 1 tablet by mouth daily.  . vitamin B-12 (CYANOCOBALAMIN) 1000 MCG tablet Take 1,000 mcg by mouth daily.  . vitamin C (ASCORBIC ACID) 500 MG tablet Take 500 mg by mouth daily.   No current facility-administered medications on file prior to visit.    Patient Active Problem List   Diagnosis Date Noted  . Dysthymia 12/20/2018  . Enrolled in chronic care management 01/02/2018  . Type 2 diabetes mellitus with complication, with long-term current use of insulin ([place]) 02/09/2017  . Atherosclerosis of aorta ([place]) 11/06/2016  . Diabetic retinopathy ([place]) 05/18/2015  . Encounter for Medicare annual wellness exam 05/18/2015  . Medication management 08/06/2013  . Neuropathy due to type 2 diabetes mellitus ([place]) 06/04/2013  . CKD stage 3 due to type 2 diabetes mellitus ([place])   . Hyperlipidemia associated with type 2 diabetes mellitus ([place])   . Vitamin D deficiency   . Hypertension 07/29/2012  . Chronic pain 07/29/2012    Screening Tests  Immunization History  Administered Date(s) Administered  . DT 01/04/2011  . Influenza Split 01/23/2013  . Influenza, High Dose Seasonal PF 02/27/2014, 03/11/2015, 12/21/2015, 01/30/2017, 02/08/2018  . Pneumococcal Conjugate-13 02/27/2014  . Pneumococcal Polysaccharide-23 01/06/2012   Preventative care: Last colonoscopy: 2006 will not get another due to age [facility] 10/2018 CXR 11/2015 DEXA 2007 patient does want Echo 2014  Lumbar 2018 MRA 2014  Tetanus 2012 Flu 2019 Pneumo 2013 Prevnar 13 2015 Shingles vaccine declines  Names of Other Physician/Practitioners you currently use: 1. [facility] here for primary care 2. [facility], eye doctor, last visit in 02/12/2018 report  abstracted  3. Dr Sissy Hoff, DDS, dentist, last visit has scheduled 01/2019  Patient Care Team: [name], MD as PCP - General (Internal Medicine) Warden Fillers, MD as Consulting Physician (Optometry) Zadie Rhine Clent Demark, MD as Consulting Physician (Ophthalmology) Glenna Fellows, MD as Attending Physician (Neurosurgery) Melrose Nakayama, MD as Consulting Physician (Orthopedic Surgery) Milus Banister, MD as Attending Physician (Gastroenterology)   Allergies Allergies  Allergen Reactions  . Morphine And Related Other (See Comments)    fuzzy feeling    SURGICAL HISTORY She  has a past surgical history that includes Abdominal hysterectomy; Cesarean section; Back surgery (1993); Appendectomy; Colonoscopy;  Eye surgery; Trigger finger release (01/10/2012); and Breast excisional biopsy (Right, 1967). FAMILY HISTORY Her family history includes Diabetes in her sister; Liver disease in her sister. SOCIAL HISTORY She  reports that she has never smoked. She has never used smokeless tobacco. She reports that she does not drink alcohol or use drugs.  MEDICARE WELLNESS OBJECTIVES: Physical activity: Current Exercise Habits: The patient does not participate in regular exercise at present(active in yard and with housework), Exercise limited by: None identified Cardiac risk factors: Cardiac Risk Factors include: advanced age (>7men, >25 women);dyslipidemia;hypertension;diabetes mellitus;sedentary lifestyle Depression/mood screen:   Depression screen [facility] 2/9 12/20/2018  Decreased Interest 1  Down, Depressed, Hopeless 1  PHQ - 2 Score 2  Altered sleeping 0  Tired, decreased energy 2  Change in appetite 0  Feeling bad or failure about yourself  0  Trouble concentrating 1  Moving slowly or fidgety/restless 0  Suicidal thoughts 0  PHQ-9 Score 5  Difficult doing work/chores Somewhat difficult    ADLs:  In your present state of health, do you have any difficulty performing the following  activities: 12/20/2018 10/01/2018  Hearing? N N  Vision? N N  Difficulty concentrating or making decisions? N Y  Comment - mild changes, denies getting lost, forgetting names, limiations in ADLs  Walking or climbing stairs? N N  Dressing or bathing? N N  Doing errands, shopping? N [place] and eating ? N N  Using the Toilet? N N  In the past six months, have you accidently leaked urine? N N  Do you have problems with loss of bowel control? N N  Managing your Medications? N N  Managing your Finances? N N  Housekeeping or managing your Housekeeping? N N  Some recent data might be hidden     Cognitive Testing  Alert? Yes  Normal Appearance?Yes  Oriented to person? Yes  Place? Yes   Time? Yes  Recall of three objects?  1/3   Can perform simple calculations? Yes  Displays appropriate judgment?Yes  Can read the correct time from a watch face?Yes  EOL planning: Does Patient Have a Medical Advance Directive?: No Would patient like information on creating a medical advance directive?: No - Patient declined(patietn has paperwork, needs to complete)   Objective:     Blood pressure 124/60, pulse (!) 52, temperature (!) 97.3 F (36.3 C), weight 124 lb 12.8 oz (56.6 kg), SpO2 97 %.   General Appearance: Well nourished, alert, WD/WN, female and in no apparent distress. Eyes: PERRLA, EOMs, conjunctiva no swelling or erythema, normal fundi and vessels. Sinuses: No frontal/maxillary tenderness ENT/Mouth: EACs patent / TMs  nl. Nares clear without erythema, swelling, mucoid exudates. Oral hygiene is good. No erythema, swelling, or exudate. Tongue normal, non-obstructing. Tonsils not swollen or erythematous. Hearing normal.  Neck: Supple, thyroid normal. No bruits, nodes or JVD. Respiratory: Respiratory effort normal.  BS equal and clear bilateral without rales, rhonci, wheezing or stridor. Cardio: Heart sounds are normal with regular rate and rhythm and no murmurs, rubs or  gallops. Peripheral pulses are normal and equal bilaterally without edema. No aortic or femoral bruits. Chest: symmetric with normal excursions and percussion. Abdomen: Flat, soft  with nl bowel sounds. Nontender, no guarding, rebound, hernias, masses, or organomegaly.  Lymphatics: Non tender without lymphadenopathy.  Musculoskeletal: Full ROM all peripheral extremities, joint stability, 5/5 strength, and normal gait. Skin: right flank with dry skin, excoriations, fine papules, no vesicles.  Neuro: Cranial nerves intact. DTR's  UE  Nl/Equal and DTR's LE's absent.  Normal muscle tone, no cerebellar symptoms. Sensation decreased bilateral feet. Pysch: Alert and oriented X 3, normal affect, Insight and Judgment appropriate.    Medicare Attestation I have personally reviewed: The patient's medical and social history Their use of alcohol, tobacco or illicit drugs Their current medications and supplements The patient's functional ability including ADLs,fall risks, home safety risks, cognitive, and hearing and visual impairment Diet and physical activities Evidence for depression or mood disorders  The patient's weight, height, BMI, and visual acuity have been recorded in the chart.  I have made referrals, counseling, and provided education to the patient based on review of the above and I have provided the patient with a written personalized care plan for preventive services.  Over 40 minutes of exam, counseling, chart review was performed.  Izora Ribas, NP   12/20/2018

## 2018-12-20 ENCOUNTER — Encounter: Payer: Self-pay | Admitting: Adult Health

## 2018-12-20 ENCOUNTER — Other Ambulatory Visit: Payer: Self-pay

## 2018-12-20 ENCOUNTER — Ambulatory Visit (INDEPENDENT_AMBULATORY_CARE_PROVIDER_SITE_OTHER): Payer: Medicare Other | Admitting: Adult Health

## 2018-12-20 VITALS — BP 124/60 | HR 52 | Temp 97.3°F | Wt 124.8 lb

## 2018-12-20 DIAGNOSIS — R6889 Other general symptoms and signs: Secondary | ICD-10-CM

## 2018-12-20 DIAGNOSIS — I700 Atherosclerosis of aorta: Secondary | ICD-10-CM | POA: Diagnosis not present

## 2018-12-20 DIAGNOSIS — E118 Type 2 diabetes mellitus with unspecified complications: Secondary | ICD-10-CM

## 2018-12-20 DIAGNOSIS — N183 Chronic kidney disease, stage 3 unspecified: Secondary | ICD-10-CM

## 2018-12-20 DIAGNOSIS — Z789 Other specified health status: Secondary | ICD-10-CM | POA: Diagnosis not present

## 2018-12-20 DIAGNOSIS — E785 Hyperlipidemia, unspecified: Secondary | ICD-10-CM

## 2018-12-20 DIAGNOSIS — E1122 Type 2 diabetes mellitus with diabetic chronic kidney disease: Secondary | ICD-10-CM | POA: Diagnosis not present

## 2018-12-20 DIAGNOSIS — Z79899 Other long term (current) drug therapy: Secondary | ICD-10-CM

## 2018-12-20 DIAGNOSIS — Z0000 Encounter for general adult medical examination without abnormal findings: Secondary | ICD-10-CM

## 2018-12-20 DIAGNOSIS — F3341 Major depressive disorder, recurrent, in partial remission: Secondary | ICD-10-CM | POA: Insufficient documentation

## 2018-12-20 DIAGNOSIS — E1169 Type 2 diabetes mellitus with other specified complication: Secondary | ICD-10-CM

## 2018-12-20 DIAGNOSIS — Z794 Long term (current) use of insulin: Secondary | ICD-10-CM

## 2018-12-20 DIAGNOSIS — E2839 Other primary ovarian failure: Secondary | ICD-10-CM

## 2018-12-20 DIAGNOSIS — G894 Chronic pain syndrome: Secondary | ICD-10-CM

## 2018-12-20 DIAGNOSIS — E11319 Type 2 diabetes mellitus with unspecified diabetic retinopathy without macular edema: Secondary | ICD-10-CM

## 2018-12-20 DIAGNOSIS — Z0001 Encounter for general adult medical examination with abnormal findings: Secondary | ICD-10-CM

## 2018-12-20 DIAGNOSIS — I10 Essential (primary) hypertension: Secondary | ICD-10-CM

## 2018-12-20 DIAGNOSIS — E559 Vitamin D deficiency, unspecified: Secondary | ICD-10-CM

## 2018-12-20 DIAGNOSIS — F341 Dysthymic disorder: Secondary | ICD-10-CM

## 2018-12-20 DIAGNOSIS — F339 Major depressive disorder, recurrent, unspecified: Secondary | ICD-10-CM | POA: Insufficient documentation

## 2018-12-20 DIAGNOSIS — E1140 Type 2 diabetes mellitus with diabetic neuropathy, unspecified: Secondary | ICD-10-CM

## 2018-12-20 DIAGNOSIS — F325 Major depressive disorder, single episode, in full remission: Secondary | ICD-10-CM | POA: Insufficient documentation

## 2018-12-20 MED ORDER — BUPROPION HCL ER (XL) 150 MG PO TB24: 150.0000 mg | ORAL_TABLET | ORAL | 0 refills | Status: DC

## 2018-12-20 NOTE — Patient Instructions (Addendum)
[name] , Thank you for taking time to come for your Medicare Wellness Visit. I appreciate your ongoing commitment to your health goals. Please review the following plan we discussed and let me know if I can assist you in the future.   These are the goals we discussed: Goals    . HEMOGLOBIN A1C < 8.0     Continue to keep track of your food and insulin Suggest checking your sugar BEFORE breakfast, luch & dinner and bring that in with you Continue to check your sugars regularly and  BRING IN YOUR LOG    Insulin regimen (novolin 70/30) Breakfast: 18 units with breakfast unless blood sugar low, then skip and check sugar before lunch, if 180+ take 20 units  Dinner:  take 15 if blood sugar low or eating a light dinner Take 18 units if blood sugar normal/high or having large dinner  Continue calling with reports of glucose if unusually low or high  STOP processed biscuits, sweets, white rice, crackers, fruit juice, cereal  A LOW SUGAR IS MORE DANGEROUS THAN A HIGH SUGAR    . LDL CALC < 70     Take rosuvastatin daily in the evening for cholesterol        This is a list of the screening recommended for you and due dates:  Health Maintenance  Topic Date Due  . Flu Shot  11/24/2018  . Eye exam for diabetics  02/13/2019  . Hemoglobin A1C  05/22/2019  . Complete foot exam   07/25/2019  . Tetanus Vaccine  01/03/2021  . DEXA scan (bone density measurement)  Completed  . Pneumonia vaccines  Completed     Check to make sure you're not due for an appointment with Dr. Katy Fitch in October 2020  Call early September to ask if we have influenza vaccine shipped in yet - can schedule a nurse visit to get      Try wellbutrin 150 mg daily in the morning for energy and mood;   Call me in 1 month to let me know if this helped  Try to start walking 2-3 days per week; this can also help with energy     Bupropion extended-release tablets (Depression/Mood Disorders) What is this  medicine? BUPROPION (byoo PROE pee on) is used to treat depression. This medicine may be used for other purposes; ask your health care provider or pharmacist if you have questions. COMMON BRAND NAME(S): Aplenzin, Budeprion XL, Forfivo XL, Wellbutrin XL What should I tell my health care provider before I take this medicine? They need to know if you have any of these conditions:  an eating disorder, such as anorexia or bulimia  bipolar disorder or psychosis  diabetes or high blood sugar, treated with medication  glaucoma  head injury or brain tumor  heart disease, previous heart attack, or irregular heart beat  high blood pressure  kidney or liver disease  seizures (convulsions)  suicidal thoughts or a previous suicide attempt  Tourette's syndrome  weight loss  an unusual or allergic reaction to bupropion, other medicines, foods, dyes, or preservatives  breast-feeding  pregnant or trying to become pregnant How should I use this medicine? Take this medicine by mouth with a glass of water. Follow the directions on the prescription label. You can take it with or without food. If it upsets your stomach, take it with food. Do not crush, chew, or cut these tablets. This medicine is taken once daily at the same time each day. Do not  take your medicine more often than directed. Do not stop taking this medicine suddenly except upon the advice of your doctor. Stopping this medicine too quickly may cause serious side effects or your condition may worsen. A special MedGuide will be given to you by the pharmacist with each prescription and refill. Be sure to read this information carefully each time. Talk to your pediatrician regarding the use of this medicine in children. Special care may be needed. Overdosage: If you think you have taken too much of this medicine contact a poison control center or emergency room at once. NOTE: This medicine is only for you. Do not share this medicine with  others. What if I miss a dose? If you miss a dose, skip the missed dose and take your next tablet at the regular time. Do not take double or extra doses. What may interact with this medicine? Do not take this medicine with any of the following medications:  linezolid  MAOIs like Azilect, Carbex, Eldepryl, Marplan, Nardil, and Parnate  methylene blue (injected into a vein)  other medicines that contain bupropion like Zyban This medicine may also interact with the following medications:  alcohol  certain medicines for anxiety or sleep  certain medicines for blood pressure like metoprolol, propranolol  certain medicines for depression or psychotic disturbances  certain medicines for HIV or AIDS like efavirenz, lopinavir, nelfinavir, ritonavir  certain medicines for irregular heart beat like propafenone, flecainide  certain medicines for Parkinson's disease like amantadine, levodopa  certain medicines for seizures like carbamazepine, phenytoin, phenobarbital  cimetidine  clopidogrel  cyclophosphamide  digoxin  furazolidone  isoniazid  nicotine  orphenadrine  procarbazine  steroid medicines like prednisone or cortisone  stimulant medicines for attention disorders, weight loss, or to stay awake  tamoxifen  theophylline  thiotepa  ticlopidine  tramadol  warfarin This list may not describe all possible interactions. Give your health care provider a list of all the medicines, herbs, non-prescription drugs, or dietary supplements you use. Also tell them if you smoke, drink alcohol, or use illegal drugs. Some items may interact with your medicine. What should I watch for while using this medicine? Tell your doctor if your symptoms do not get better or if they get worse. Visit your doctor or healthcare provider for regular checks on your progress. Because it may take several weeks to see the full effects of this medicine, it is important to continue your  treatment as prescribed by your doctor. This medicine may cause serious skin reactions. They can happen weeks to months after starting the medicine. Contact your healthcare provider right away if you notice fevers or flu-like symptoms with a rash. The rash may be red or purple and then turn into blisters or peeling of the skin. Or, you might notice a red rash with swelling of the face, lips or lymph nodes in your neck or under your arms. Patients and their families should watch out for new or worsening thoughts of suicide or depression. Also watch out for sudden changes in feelings such as feeling anxious, agitated, panicky, irritable, hostile, aggressive, impulsive, severely restless, overly excited and hyperactive, or not being able to sleep. If this happens, especially at the beginning of treatment or after a change in dose, call your healthcare provider. Avoid alcoholic drinks while taking this medicine. Drinking large amounts of alcoholic beverages, using sleeping or anxiety medicines, or quickly stopping the use of these agents while taking this medicine may increase your risk for a seizure. Do not  drive or use heavy machinery until you know how this medicine affects you. This medicine can impair your ability to perform these tasks. Do not take this medicine close to bedtime. It may prevent you from sleeping. Your mouth may get dry. Chewing sugarless gum or sucking hard candy, and drinking plenty of water may help. Contact your doctor if the problem does not go away or is severe. The tablet shell for some brands of this medicine does not dissolve. This is normal. The tablet shell may appear whole in the stool. This is not a cause for concern. What side effects may I notice from receiving this medicine? Side effects that you should report to your doctor or health care professional as soon as possible:  allergic reactions like skin rash, itching or hives, swelling of the face, lips, or  tongue  breathing problems  changes in vision  confusion  elevated mood, decreased need for sleep, racing thoughts, impulsive behavior  fast or irregular heartbeat  hallucinations, loss of contact with reality  increased blood pressure  rash, fever, and swollen lymph nodes  redness, blistering, peeling or loosening of the skin, including inside the mouth  seizures  suicidal thoughts or other mood changes  unusually weak or tired  vomiting Side effects that usually do not require medical attention (report to your doctor or health care professional if they continue or are bothersome):  constipation  headache  loss of appetite  nausea  tremors  weight loss This list may not describe all possible side effects. Call your doctor for medical advice about side effects. You may report side effects to FDA at 1-800-FDA-1088. Where should I keep my medicine? Keep out of the reach of children. Store at room temperature between 15 and 30 degrees C (59 and 86 degrees F). Throw away any unused medicine after the expiration date. NOTE: This sheet is a summary. It may not cover all possible information. If you have questions about this medicine, talk to your doctor, pharmacist, or health care provider.  2020 Elsevier/Gold Standard (2018-07-05 13:45:31)

## 2019-01-02 ENCOUNTER — Encounter: Payer: Self-pay | Admitting: Adult Health

## 2019-01-02 ENCOUNTER — Ambulatory Visit (INDEPENDENT_AMBULATORY_CARE_PROVIDER_SITE_OTHER): Payer: Medicare Other | Admitting: Adult Health

## 2019-01-02 ENCOUNTER — Other Ambulatory Visit: Payer: Self-pay

## 2019-01-02 VITALS — BP 144/68 | HR 59 | Temp 98.1°F | Wt 123.0 lb

## 2019-01-02 DIAGNOSIS — Z23 Encounter for immunization: Secondary | ICD-10-CM

## 2019-01-02 DIAGNOSIS — E1140 Type 2 diabetes mellitus with diabetic neuropathy, unspecified: Secondary | ICD-10-CM

## 2019-01-02 DIAGNOSIS — R300 Dysuria: Secondary | ICD-10-CM

## 2019-01-02 MED ORDER — GABAPENTIN 100 MG PO CAPS: ORAL_CAPSULE | ORAL | 1 refills | Status: DC

## 2019-01-02 NOTE — Patient Instructions (Addendum)
Can take 1 cap gabapentin with breakfast and lunch as needed for foot pain 1-2 caps with dinner or 1 hour prior to bedtime for foot pain     Dysuria Dysuria is pain or discomfort while urinating. The pain or discomfort may be felt in the part of your body that drains urine from the bladder (urethra) or in the surrounding tissue of the genitals. The pain may also be felt in the groin area, lower abdomen, or lower back. You may have to urinate frequently or have the sudden feeling that you have to urinate (urgency). Dysuria can affect both men and women, but it is more common in women. Dysuria can be caused by many different things, including:  Urinary tract infection.  Kidney stones or bladder stones.  Certain sexually transmitted infections (STIs), such as chlamydia.  Dehydration.  Inflammation of the tissues of the vagina.  Use of certain medicines.  Use of certain soaps or scented products that cause irritation. Follow these instructions at home: General instructions  Watch your condition for any changes.  Urinate often. Avoid holding urine for long periods of time.  After a bowel movement or urination, women should cleanse from front to back, using each tissue only once.  Urinate after sexual intercourse.  Keep all follow-up visits as told by your health care provider. This is important.  If you had any tests done to find the cause of dysuria, it is up to you to get your test results. Ask your health care provider, or the department that is doing the test, when your results will be ready. Eating and drinking   Drink enough fluid to keep your urine pale yellow.  Avoid caffeine, tea, and alcohol. They can irritate the bladder and make dysuria worse. In men, alcohol may irritate the prostate. Medicines  Take over-the-counter and prescription medicines only as told by your health care provider.  If you were prescribed an antibiotic medicine, take it as told by your  health care provider. Do not stop taking the antibiotic even if you start to feel better. Contact a health care provider if:  You have a fever.  You develop pain in your back or sides.  You have nausea or vomiting.  You have blood in your urine.  You are not urinating as often as you usually do. Get help right away if:  Your pain is severe and not relieved with medicines.  You cannot eat or drink without vomiting.  You are confused.  You have a rapid heartbeat while at rest.  You have shaking or chills.  You feel extremely weak. Summary  Dysuria is pain or discomfort while urinating. Many different conditions can lead to dysuria.  If you have dysuria, you may have to urinate frequently or have the sudden feeling that you have to urinate (urgency).  Watch your condition for any changes. Keep all follow-up visits as told by your health care provider.  Make sure that you urinate often and drink enough fluid to keep your urine pale yellow. This information is not intended to replace advice given to you by your health care provider. Make sure you discuss any questions you have with your health care provider. Document Released: 01/08/2004 Document Revised: 03/24/2017 Document Reviewed: 01/26/2017 Elsevier Patient Education  [place].      Diabetic Neuropathy Diabetic neuropathy refers to nerve damage that is caused by diabetes (diabetes mellitus). Over time, people with diabetes can develop nerve damage throughout the body. There are several types  of diabetic neuropathy:  Peripheral neuropathy. This is the most common type of diabetic neuropathy. It causes damage to nerves that carry signals between the spinal cord and other parts of the body (peripheral nerves). This usually affects nerves in the feet and legs first, and may eventually affect the hands and arms. The damage affects the ability to sense touch or temperature.  Autonomic neuropathy. This type causes  damage to nerves that control involuntary functions (autonomic nerves). These nerves carry signals that control: ? Heartbeat. ? Body temperature. ? Blood pressure. ? Urination. ? Digestion. ? Sweating. ? Sexual function. ? Response to changing blood sugar (glucose) levels.  Focal neuropathy. This type of nerve damage affects one area of the body, such as an arm, a leg, or the face. The injury may involve one nerve or a small group of nerves. Focal neuropathy can be painful and unpredictable, and occurs most often in older adults with diabetes. This often develops suddenly, but usually improves over time and does not cause long-term problems.  Proximal neuropathy. This type of nerve damage affects the nerves of the thighs, hips, buttocks, or legs. It causes severe pain, weakness, and muscle death (atrophy), usually in the thigh muscles. It is more common among older men and people who have type 2 diabetes. The length of recovery time may vary. What are the causes? Peripheral, autonomic, and focal neuropathies are caused by diabetes that is not well controlled with treatment. The cause of proximal neuropathy is not known, but it may be caused by inflammation related to uncontrolled blood glucose levels. What are the signs or symptoms? Peripheral neuropathy Peripheral neuropathy develops slowly over time. When the nerves of the feet and legs no longer work, you may experience:  Burning, stabbing, or aching pain in the legs or feet.  Pain or cramping in the legs or feet.  Loss of feeling (numbness) and inability to feel pressure or pain in the feet. This can lead to: ? Thick calluses or sores on areas of constant pressure. ? Ulcers. ? Reduced ability to feel temperature changes.  Foot deformities.  Muscle weakness.  Loss of balance or coordination. Autonomic neuropathy The symptoms of autonomic neuropathy vary depending on which nerves are affected. Symptoms may include:  Problems  with digestion, such as: ? Nausea or vomiting. ? Poor appetite. ? Bloating. ? Diarrhea or constipation. ? Trouble swallowing. ? Losing weight without trying to.  Problems with the heart, blood and lungs, such as: ? Dizziness, especially when standing up. ? Fainting. ? Shortness of breath. ? Irregular heartbeat.  Bladder problems, such as: ? Trouble starting or stopping urination. ? Leaking urine. ? Trouble emptying the bladder. ? Urinary tract infections (UTIs).  Problems with other body functions, such as: ? Sweat. You may sweat too much or too little. ? Temperature. You might get hot easily. Or, you might feel cold more than usual. ? Sexual function. Men may not be able to get or maintain an erection. Women may have vaginal dryness and difficulty with arousal. Focal neuropathy Symptoms affect only one area of the body. Common symptoms include:  Numbness.  Tingling.  Burning pain.  Prickling feeling.  Very sensitive skin.  Weakness.  Inability to move (paralysis).  Muscle twitching.  Muscles getting smaller (wasting).  Poor coordination.  Double or blurred vision. Proximal neuropathy  Sudden, severe pain in the hip, thigh, or buttocks. Pain may spread from the back into the legs (sciatica).  Pain and numbness in the arms and legs.  Tingling.  Loss of bladder control or bowel control.  Weakness and wasting of thigh muscles.  Difficulty getting up from a seated position.  Abdominal swelling.  Unexplained weight loss. How is this diagnosed? Diagnosis usually involves reviewing your medical history and any symptoms you have. Diagnosis varies depending on the type of neuropathy your health care provider suspects. Peripheral neuropathy Your health care provider will check areas that are affected by your nervous system (neurologic exam), such as your reflexes, how you move, and what you can feel. You may have other tests, such as:  Blood  tests.  Removal and examination of fluid that surrounds the spinal cord (lumbar puncture).  CT scan.  MRI.  A test to check the nerves that control muscles (electromyogram, EMG).  Tests of how quickly messages pass through your nerves (nerve conduction velocity tests).  Removal of a small piece of nerve to be examined under a microscope (biopsy). Autonomic neuropathy You may have tests, such as:  Tests to measure your blood pressure and heart rate. This may include monitoring you while you are safely secured to an exam table that moves you from a lying position to an upright position (table tilt test).  Breathing tests to check your lungs.  Tests to check how food moves through the digestive system (gastric emptying tests).  Blood, sweat, or urine tests.  Ultrasound of your bladder.  Spinal fluid tests. Focal neuropathy This condition may be diagnosed with:  A neurologic exam.  CT scan.  MRI.  EMG.  Nerve conduction velocity tests. Proximal neuropathy There is no test to diagnose this type of neuropathy. You may have tests to rule out other possible causes of this type of neuropathy. Tests may include:  X-rays of your spine and lumbar region.  Lumbar puncture.  MRI. How is this treated? The goal of treatment is to keep nerve damage from getting worse. The most important part of treatment is keeping your blood glucose level and your A1C level within your target range by following your diabetes management plan. Over time, maintaining lower blood glucose levels helps lessen symptoms. In some cases, you may need prescription pain medicine. Follow these instructions at home:  Lifestyle   Do not use any products that contain nicotine or tobacco, such as cigarettes and e-cigarettes. If you need help quitting, ask your health care provider.  Be physically active every day. Include strength training and balance exercises.  Follow a healthy meal plan.  Work with your  health care provider to manage your blood pressure. General instructions  Follow your diabetes management plan as directed. ? Check your blood glucose levels as directed by your health care provider. ? Keep your blood glucose in your target range as directed by your health care provider. ? Have your A1C level checked at least two times a year, or as often as told by your health care provider.  Take over the counter and prescription medicines only as told by your health care provider. This includes insulin and diabetes medicine.  Do not drive or use heavy machinery while taking prescription pain medicines.  Check your skin and feet every day for cuts, bruises, redness, blisters, or sores.  Keep all follow up visits as told by your health care provider. This is important. Contact a health care provider if:  You have burning, stabbing, or aching pain in your legs or feet.  You are unable to feel pressure or pain in your feet.  You develop problems with digestion, such  as: ? Nausea. ? Vomiting. ? Bloating. ? Constipation. ? Diarrhea. ? Abdominal pain.  You have difficulty with urination, such as inability: ? To control when you urinate (incontinence). ? To completely empty the bladder (retention).  You have palpitations.  You feel dizzy, weak, or faint when you stand up. Get help right away if:  You cannot urinate.  You have sudden weakness or loss of coordination.  You have trouble speaking.  You have pain or pressure in your chest.  You have an irregular heart beat.  You have sudden inability to move a part of your body. Summary  Diabetic neuropathy refers to nerve damage that is caused by diabetes. It can affect nerves throughout the entire body, causing numbness and pain in the arms, legs, digestive tract, heart, and other body systems.  Keep your blood glucose level and your blood pressure in your target range, as directed by your health care provider. This can  help prevent neuropathy from getting worse.  Check your skin and feet every day for cuts, bruises, redness, blisters, or sores.  Do not use any products that contain nicotine or tobacco, such as cigarettes and e-cigarettes. If you need help quitting, ask your health care provider. This information is not intended to replace advice given to you by your health care provider. Make sure you discuss any questions you have with your health care provider. Document Released: 06/20/2001 Document Revised: 05/24/2017 Document Reviewed: 05/16/2016 Elsevier Patient Education  [place].     Gabapentin capsules or tablets What is this medicine? GABAPENTIN (GA ba pen tin) is used to control seizures in certain types of epilepsy. It is also used to treat certain types of nerve pain. This medicine may be used for other purposes; ask your health care provider or pharmacist if you have questions. COMMON BRAND NAME(S): Active-PAC with Gabapentin, Gabarone, Neurontin What should I tell my health care provider before I take this medicine? They need to know if you have any of these conditions:  history of drug abuse or alcohol abuse problem  kidney disease  lung or breathing disease  suicidal thoughts, plans, or attempt; a previous suicide attempt by you or a family member  an unusual or allergic reaction to gabapentin, other medicines, foods, dyes, or preservatives  pregnant or trying to get pregnant  breast-feeding How should I use this medicine? Take this medicine by mouth with a glass of water. Follow the directions on the prescription label. You can take it with or without food. If it upsets your stomach, take it with food. Take your medicine at regular intervals. Do not take it more often than directed. Do not stop taking except on your doctor's advice. If you are directed to break the 600 or 800 mg tablets in half as part of your dose, the extra half tablet should be used for the next  dose. If you have not used the extra half tablet within 28 days, it should be thrown away. A special MedGuide will be given to you by the pharmacist with each prescription and refill. Be sure to read this information carefully each time. Talk to your pediatrician regarding the use of this medicine in children. While this drug may be prescribed for children as young as 3 years for selected conditions, precautions do apply. Overdosage: If you think you have taken too much of this medicine contact a poison control center or emergency room at once. NOTE: This medicine is only for you. Do not share this  medicine with others. What if I miss a dose? If you miss a dose, take it as soon as you can. If it is almost time for your next dose, take only that dose. Do not take double or extra doses. What may interact with this medicine? This medicine may interact with the following medications:  alcohol  antihistamines for allergy, cough, and cold  certain medicines for anxiety or sleep  certain medicines for depression like amitriptyline, fluoxetine, sertraline  certain medicines for seizures like phenobarbital, primidone  certain medicines for stomach problems  general anesthetics like halothane, isoflurane, methoxyflurane, propofol  local anesthetics like lidocaine, pramoxine, tetracaine  medicines that relax muscles for surgery  narcotic medicines for pain  phenothiazines like chlorpromazine, mesoridazine, prochlorperazine, thioridazine This list may not describe all possible interactions. Give your health care provider a list of all the medicines, herbs, non-prescription drugs, or dietary supplements you use. Also tell them if you smoke, drink alcohol, or use illegal drugs. Some items may interact with your medicine. What should I watch for while using this medicine? Visit your doctor or health care provider for regular checks on your progress. You may want to keep a record at home of how you  feel your condition is responding to treatment. You may want to share this information with your doctor or health care provider at each visit. You should contact your doctor or health care provider if your seizures get worse or if you have any new types of seizures. Do not stop taking this medicine or any of your seizure medicines unless instructed by your doctor or health care provider. Stopping your medicine suddenly can increase your seizures or their severity. This medicine may cause serious skin reactions. They can happen weeks to months after starting the medicine. Contact your health care provider right away if you notice fevers or flu-like symptoms with a rash. The rash may be red or purple and then turn into blisters or peeling of the skin. Or, you might notice a red rash with swelling of the face, lips or lymph nodes in your neck or under your arms. Wear a medical identification bracelet or chain if you are taking this medicine for seizures, and carry a card that lists all your medications. You may get drowsy, dizzy, or have blurred vision. Do not drive, use machinery, or do anything that needs mental alertness until you know how this medicine affects you. To reduce dizzy or fainting spells, do not sit or stand up quickly, especially if you are an older patient. Alcohol can increase drowsiness and dizziness. Avoid alcoholic drinks. Your mouth may get dry. Chewing sugarless gum or sucking hard candy, and drinking plenty of water will help. The use of this medicine may increase the chance of suicidal thoughts or actions. Pay special attention to how you are responding while on this medicine. Any worsening of mood, or thoughts of suicide or dying should be reported to your health care provider right away. Women who become pregnant while using this medicine may enroll in the [place] Pregnancy Registry by calling [phone]. This registry collects information about the safety  of antiepileptic drug use during pregnancy. What side effects may I notice from receiving this medicine? Side effects that you should report to your doctor or health care professional as soon as possible:  allergic reactions like skin rash, itching or hives, swelling of the face, lips, or tongue  breathing problems  rash, fever, and swollen lymph nodes  redness, blistering,  peeling or loosening of the skin, including inside the mouth  suicidal thoughts, mood changes Side effects that usually do not require medical attention (report to your doctor or health care professional if they continue or are bothersome):  dizziness  drowsiness  headache  nausea, vomiting  swelling of ankles, feet, hands  tiredness This list may not describe all possible side effects. Call your doctor for medical advice about side effects. You may report side effects to FDA at 1-800-FDA-1088. Where should I keep my medicine? Keep out of reach of children. This medicine may cause accidental overdose and death if it taken by other adults, children, or pets. Mix any unused medicine with a substance like cat litter or coffee grounds. Then throw the medicine away in a sealed container like a sealed bag or a coffee can with a lid. Do not use the medicine after the expiration date. Store at room temperature between 15 and 30 degrees C (59 and 86 degrees F). NOTE: This sheet is a summary. It may not cover all possible information. If you have questions about this medicine, talk to your doctor, pharmacist, or health care provider.  2020 Elsevier/[employer] Standard (2018-07-13 14:16:43)

## 2019-01-02 NOTE — Progress Notes (Signed)
Assessment and Plan:  [name] was seen today for urinary tract infection and foot pain.  Diagnoses and all orders for this visit:  Dysuria UTI versus OAB versus vaginal dryness ? Resolved; defer starting abx pending UA Continue to push fluid intake Hygiene reviewed -     Urinalysis w microscopic + reflex cultur  Neuropathy due to type 2 diabetes mellitus ([place]) Will restart gabapentin, discussed low dose due to kidney functions, will monitor closely, discussed feet checks.  -     gabapentin (NEURONTIN) 100 MG capsule; Take 1 cap with breakfast and lunch as needed for foot pain; can take 1-2 caps with dinner or prior to bedtime; max total of 4 caps daily by mouth.  Need for immunization against influenza -     Flu vaccine HIGH DOSE PF   Further disposition pending results of labs. Discussed med's effects and SE's.   Over 15 minutes of exam, counseling, chart review, and critical decision making was performed.   Future Appointments  Date Time Provider [place]  01/30/2019 10:30 AM [name], MD [facility] None  08/30/2019 10:00 AM [name], MD [facility] None  12/23/2019  2:00 PM Liane Comber, NP [facility] None    ------------------------------------------------------------------------------------------------------------------   HPI BP (!) 144/68   Pulse (!) 59   Temp 98.1 F (36.7 C)   Wt 123 lb (55.8 kg)   SpO2 97%   BMI 21.11 kg/m   85 y.o.female presents for evaluation of dysuria and foot pain.   She reports 3-4 days of dysuria, frequency, urgency; she reports has been pushing water intake and feels her symptoms are improved today, essentially resolved, but "wanted to make sure it wasn't another infection." She did note foul odor to urine this AM, but denies cloudiness, hematuria. Denies fever/chills, flank pain, vaginal discharge.   She is also concerned about persistent neuropathic pain, "tingling" and "tender," worse in the afternoon and at  night, keeps her up at night. Has tried OTC arthritis cream which hasn't helped.    Lab Results  Component Value Date   GFRNONAA 35 (L) 11/19/2018   Lab Results  Component Value Date   VITAMINB12 1,136 (H) 11/14/2016       Past Medical History:  Diagnosis Date  . Arthritis   . Chronic back pain   . Diabetic neuropathy ([place])   . Hypercholesteremia   . Hypertension   . Liver lesion   . Neuropathy   . Type II or unspecified type diabetes mellitus without mention of complication, not stated as uncontrolled   . Vitamin D deficiency   . Wears glasses      Allergies  Allergen Reactions  . Morphine And Related Other (See Comments)    fuzzy feeling    Current Outpatient Medications on File Prior to Visit  Medication Sig  . ACCU-CHEK AVIVA PLUS test strip Test 3 times a day  . Apple Cider Vinegar 188 MG CAPS Take by mouth.  Marland Kitchen aspirin 81 MG tablet Take 81 mg by mouth daily.  . ASSURE COMFORT LANCETS 30G MISC Check blood sugar 3 times daily-DX-E11.22  . bisoprolol-hydrochlorothiazide (ZIAC) 5-6.25 MG tablet Take 1/2 to 1 Tablet daily as directed for BP  . Blood Glucose Calibration (ACCU-CHEK AVIVA) SOLN Check blood sugar 3 times daily-DX-E11.22  . buPROPion (WELLBUTRIN XL) 150 MG 24 hr tablet Take 1 tablet (150 mg total) by mouth every morning.  . Cholecalciferol (VITAMIN D3) 2000 UNITS TABS Take 1 capsule by mouth 2 (two) times daily.  Verneita Griffes  500 MG TABS Take by mouth 2 (two) times daily.   . Continuous Blood Gluc Sensor MISC Check blood sugar 3 times a day- Dx: E11.8/Z79.4  . dorzolamide-timolol (COSOPT) 22.3-6.8 MG/ML ophthalmic solution   . EASY COMFORT INSULIN SYRINGE 30G X 1/2" 0.5 ML MISC   . hyoscyamine (LEVSIN SL) 0.125 MG SL tablet Take 1 to 2 tablets 3 to 4 x day if needed for Nausea, vomiting, cramping or diarrhea  . lactulose (CHRONULAC) 10 GM/15ML solution Take 15 ml (3 tsp) once or twice daily as needed for constipation.  Marland Kitchen latanoprost (XALATAN) 0.005 %  ophthalmic solution   . LOTEMAX 0.5 % GEL   . Magnesium 400 MG TABS Take 1 tablet by mouth daily.  . Multiple Vitamins-Minerals (HAIR/SKIN/NAILS) TABS Take by mouth 2 (two) times daily.  Marland Kitchen NOVOLIN 70/30 FLEXPEN RELION (70-30) 100 UNIT/ML PEN INJECT 10 UNITS SUBCUTANEOUSLY WITH BREAKFAST AND 5 UNITS WITH SUPPER AS  DIRECTED  . rOPINIRole (REQUIP) 3 MG tablet TAKE 2 TABLETS BY MOUTH AT BEDTIME FOR RESTLESSNESS  . rosuvastatin (CRESTOR) 20 MG tablet Take 1 tablet Daily for Cholesterol  . senna-docusate (SENOKOT-S) 8.6-50 MG tablet Take 1 tablet by mouth daily.  . vitamin B-12 (CYANOCOBALAMIN) 1000 MCG tablet Take 1,000 mcg by mouth daily.  . vitamin C (ASCORBIC ACID) 500 MG tablet Take 500 mg by mouth daily.   No current facility-administered medications on file prior to visit.     ROS: all negative except above.   Physical Exam:  BP (!) 144/68   Pulse (!) 59   Temp 98.1 F (36.7 C)   Wt 123 lb (55.8 kg)   SpO2 97%   BMI 21.11 kg/m   General Appearance: Well nourished, well dressed elder, in no apparent distress. Eyes: conjunctiva no swelling or erythema ENT/Mouth:  Hearing normal.  Neck: Supple Respiratory: Respiratory effort normal, BS equal bilaterally without rales, rhonchi, wheezing or stridor.  Cardio: RRR with no MRGs. 1+ pedal/post tibial peripheral pulses without edema. Cap refill 2-4 sec through toes, feet cool to touch  Abdomen: Soft, + BS.  Non tender, no guarding, rebound, hernias, masses. No CVA tenderness Lymphatics: Non tender without lymphadenopathy.  Musculoskeletal: Full ROM, no bony tenderness, erythema, effusion of joint; no palpable bony abnormality; no obvious deformity; normal gait.  Skin: Warm, dry without rashes, lesions, ecchymosis.  Neuro:  Sensation intact to touch and monofilament Psych: Awake and oriented X 3, normal affect, Insight and Judgment appropriate.     Izora Ribas, NP 2:20 PM [facility]

## 2019-01-02 NOTE — Progress Notes (Signed)
Patient presents to the office forHDFlu Vaccine. Vaccine administered toLEFTDeltoid withoutanycomplication. Temperature taken and recorded 

## 2019-01-03 LAB — URINALYSIS W MICROSCOPIC + REFLEX CULTURE
Bacteria, UA: NONE SEEN /HPF
Bilirubin Urine: NEGATIVE
Hgb urine dipstick: NEGATIVE
Hyaline Cast: NONE SEEN /LPF
Ketones, ur: NEGATIVE
Leukocyte Esterase: NEGATIVE
Nitrites, Initial: NEGATIVE
Protein, ur: NEGATIVE
RBC / HPF: NONE SEEN /HPF (ref 0–2)
Specific Gravity, Urine: 1.022 (ref 1.001–1.03)
pH: <=5 (ref 5.0–8.0)

## 2019-01-03 LAB — NO CULTURE INDICATED

## 2019-01-14 ENCOUNTER — Other Ambulatory Visit: Payer: Self-pay | Admitting: Adult Health

## 2019-01-15 DIAGNOSIS — E119 Type 2 diabetes mellitus without complications: Secondary | ICD-10-CM | POA: Diagnosis not present

## 2019-01-15 DIAGNOSIS — H353222 Exudative age-related macular degeneration, left eye, with inactive choroidal neovascularization: Secondary | ICD-10-CM | POA: Diagnosis not present

## 2019-01-15 DIAGNOSIS — H35351 Cystoid macular degeneration, right eye: Secondary | ICD-10-CM | POA: Diagnosis not present

## 2019-01-15 DIAGNOSIS — H353211 Exudative age-related macular degeneration, right eye, with active choroidal neovascularization: Secondary | ICD-10-CM | POA: Diagnosis not present

## 2019-01-29 ENCOUNTER — Encounter: Payer: Self-pay | Admitting: Internal Medicine

## 2019-01-29 NOTE — Progress Notes (Signed)
History of Present Illness:      This very nice 85 y.o. MWF presents for 6 month follow up with HTN, HLD,  T2_IDDM and Vitamin D Deficiency.       Patient is c/o Left hip & Buttock pains.       Patient is treated for HTN (2008) & BP has been controlled at home. Todays BP is elevated at 162/76. Patient has had no complaints of any cardiac type chest pain, palpitations, dyspnea / orthopnea / PND, dizziness, claudication, or dependent edema.      Hyperlipidemia is controlled with diet & Rosuvastatin. Patient denies myalgias or other med SEs. Last Lipids were at goal albeit elevated Trig's:  Lab Results  Component Value Date   CHOL 159 11/19/2018   HDL 50 11/19/2018   LDLCALC 74 11/19/2018   TRIG 263 (H) 11/19/2018   CHOLHDL 3.2 11/19/2018       Also, the patient has history of T2_IDDM (1976) and started on Insulin in 2000.  She has diabetic retinopathy, CKD 3/4  and peripheral sensory neuropathy. She has had no symptoms of reactive hypoglycemia, diabetic polys or visual blurring.  Historically patient's control  / compliance was been poor & last A1c was not at goal:  Lab Results  Component Value Date   HGBA1C 8.9 (H) 11/19/2018       Further, the patient also has history of Vitamin D Deficiency and supplements vitamin D without any suspected side-effects. Last vitamin D was at goal:  Lab Results  Component Value Date   VD25OH 74 07/26/2018   Current Outpatient Medications on File Prior to Visit  Medication Sig   ACCU-CHEK AVIVA PLUS test strip Test 3 times a day   aspirin 81 MG tablet Take 81 mg by mouth daily.   ASSURE COMFORT LANCETS 30G MISC Check blood sugar 3 times daily-DX-E11.22   bisoprolol-hydrochlorothiazide (ZIAC) 5-6.25 MG tablet Take 1/2 to 1 Tablet daily as directed for BP   Blood Glucose Calibration (ACCU-CHEK AVIVA) SOLN Check blood sugar 3 times daily-DX-E11.22   buPROPion (WELLBUTRIN XL) 150 MG 24 hr tablet Take 1 tablet (150 mg total) by mouth  every morning.   Cholecalciferol (VITAMIN D3) 2000 UNITS TABS Take 1 capsule by mouth 2 (two) times daily.   COMFORT EZ PEN NEEDLES 32G X 4 MM MISC USE 2 TIMES DAILY AS DIRECTED , 1 PEN NEEDLE PER INJECTION.   Continuous Blood Gluc Sensor MISC Check blood sugar 3 times a day- Dx: E11.8/Z79.4   dorzolamide-timolol (COSOPT) 22.3-6.8 MG/ML ophthalmic solution    EASY COMFORT INSULIN SYRINGE 30G X 1/2" 0.5 ML MISC    gabapentin (NEURONTIN) 100 MG capsule Take 1 cap with breakfast and lunch as needed for foot pain; can take 1-2 caps with dinner or prior to bedtime; max total of 4 caps daily by mouth.   hyoscyamine (LEVSIN SL) 0.125 MG SL tablet Take 1 to 2 tablets 3 to 4 x day if needed for Nausea, vomiting, cramping or diarrhea   lactulose (CHRONULAC) 10 GM/15ML solution Take 15 ml (3 tsp) once or twice daily as needed for constipation.   latanoprost (XALATAN) 0.005 % ophthalmic solution    LOTEMAX 0.5 % GEL    Magnesium 400 MG TABS Take 1 tablet by mouth daily.   Multiple Vitamins-Minerals (HAIR/SKIN/NAILS) TABS Take by mouth 2 (two) times daily.   NOVOLIN 70/30 FLEXPEN RELION (70-30) 100 UNIT/ML PEN INJECT 10 UNITS SUBCUTANEOUSLY WITH BREAKFAST AND 5 UNITS WITH SUPPER AS  DIRECTED   rOPINIRole (REQUIP) 3 MG tablet TAKE 2 TABLETS BY MOUTH AT BEDTIME FOR RESTLESSNESS   rosuvastatin (CRESTOR) 20 MG tablet Take 1 tablet Daily for Cholesterol   senna-docusate (SENOKOT-S) 8.6-50 MG tablet Take 1 tablet by mouth daily.   vitamin B-12 (CYANOCOBALAMIN) 1000 MCG tablet Take 1,000 mcg by mouth daily.   vitamin C (ASCORBIC ACID) 500 MG tablet Take 500 mg by mouth daily.   No current facility-administered medications on file prior to visit.    Allergies  Allergen Reactions   Morphine And Related Other (See Comments)    fuzzy feeling   PMHx:   Past Medical History:  Diagnosis Date   Arthritis    Chronic back pain    Diabetic neuropathy ([place])    Hypercholesteremia     Hypertension    Liver lesion    Neuropathy    Type II or unspecified type diabetes mellitus without mention of complication, not stated as uncontrolled    Vitamin D deficiency    Wears glasses    Immunization History  Administered Date(s) Administered   DT 01/04/2011   Influenza Split 01/23/2013   Influenza, High Dose Seasonal PF 02/27/2014, 03/11/2015, 12/21/2015, 01/30/2017, 02/08/2018, 01/02/2019   Pneumococcal Conjugate-13 02/27/2014   Pneumococcal Polysaccharide-23 01/06/2012   Past Surgical History:  Procedure Laterality Date   ABDOMINAL HYSTERECTOMY     APPENDECTOMY     BACK SURGERY  1993   lumb lam-fusion   BREAST EXCISIONAL BIOPSY Right 1967   benign   CESAREAN SECTION     x 3    COLONOSCOPY     EYE SURGERY     both cataracts   TRIGGER FINGER RELEASE  01/10/2012   Procedure: RELEASE TRIGGER FINGER/A-1 PULLEY;  Surgeon: Hessie Dibble, MD;  Location: [facility];  Service: Orthopedics;  Laterality: Right;  right ring trigger release   FHx:    Reviewed / unchanged  SHx:    Reviewed / unchanged   Systems Review:  Constitutional: Denies fever, chills, wt changes, headaches, insomnia, fatigue, night sweats, change in appetite. Eyes: Denies redness, blurred vision, diplopia, discharge, itchy, watery eyes.  ENT: Denies discharge, congestion, post nasal drip, epistaxis, sore throat, earache, hearing loss, dental pain, tinnitus, vertigo, sinus pain, snoring.  CV: Denies chest pain, palpitations, irregular heartbeat, syncope, dyspnea, diaphoresis, orthopnea, PND, claudication or edema. Respiratory: denies cough, dyspnea, DOE, pleurisy, hoarseness, laryngitis, wheezing.  Gastrointestinal: Denies dysphagia, odynophagia, heartburn, reflux, water brash, abdominal pain or cramps, nausea, vomiting, bloating, diarrhea, constipation, hematemesis, melena, hematochezia  or hemorrhoids. Genitourinary: Denies dysuria, frequency, urgency, nocturia,  hesitancy, discharge, hematuria or flank pain. Musculoskeletal: Denies arthralgias, myalgias, stiffness, jt. swelling, pain, limping or strain/sprain.  Skin: Denies pruritus, rash, hives, warts, acne, eczema or change in skin lesion(s). Neuro: No weakness, tremor, incoordination, spasms, paresthesia or pain. Psychiatric: Denies confusion, memory loss or sensory loss. Endo: Denies change in weight, skin or hair change.  Heme/Lymph: No excessive bleeding, bruising or enlarged lymph nodes.  Physical Exam  BP (!) 162/76    Pulse 64    Temp (!) 97 F (36.1 C)    Resp 16    Ht 5' 3.5" (1.613 m)    Wt 129 lb 12.8 oz (58.9 kg)    BMI 22.63 kg/m   Appears  well nourished, well groomed  and in no distress.  Eyes: PERRLA, EOMs, conjunctiva no swelling or erythema. Sinuses: No frontal/maxillary tenderness ENT/Mouth: EAC's clear, TM's nl w/o erythema, bulging. Nares clear w/o erythema, swelling, exudates.  Oropharynx clear without erythema or exudates. Oral hygiene is good. Tongue normal, non obstructing. Hearing intact.  Neck: Supple. Thyroid not palpable. Car 2+/2+ without bruits, nodes or JVD. Chest: Respirations nl with BS clear & equal w/o rales, rhonchi, wheezing or stridor.  Cor: Heart sounds normal w/ regular rate and rhythm without sig. murmurs, gallops, clicks or rubs. Peripheral pulses normal and equal  without edema.  Abdomen: Soft & bowel sounds normal. Non-tender w/o guarding, rebound, hernias, masses or organomegaly.  Lymphatics: Unremarkable.  Musculoskeletal: Full ROM all peripheral extremities, joint stability, 5/5 strength and normal gait. Tender over Lt hip Gr. Trochanteric bursae and also over the Lt hip ischial turberosity. Skin: Warm, dry without exposed rashes, lesions or ecchymosis apparent.  Neuro: Cranial nerves intact, reflexes equal bilaterally. Sensory-motor testing grossly intact. Tendon reflexes grossly intact.  Pysch: Alert & oriented x 3.  Insight and judgement -  limited. No ideations.  Assessment and Plan:  1. Essential hypertension  - Continue medication, monitor blood pressure at home.  - Continue DASH diet.  Reminder to go to the ER if any CP,  SOB, nausea, dizziness, severe HA, changes vision/speech.  - CBC with Differential/Platelet - COMPLETE METABOLIC PANEL WITH GFR - Magnesium - TSH  2. Hyperlipidemia associated with type 2 diabetes mellitus ([place])  - Continue diet/meds, exercise,& lifestyle modifications.  - Continue monitor periodic cholesterol/liver & renal functions   - Lipid panel - TSH  3. Type 2 diabetes mellitus with stage 3b chronic kidney disease, with long-term current use of insulin ([facility])  - Continue diet, exercise  - Lifestyle modifications.  - Monitor appropriate labs.  - Hemoglobin A1c  4. Vitamin D deficiency  - Continue supplementation.  - VITAMIN D 25 Hydroxyl  5. Hypothyroidism  - TSH  6. Medication management  - CBC with Differential/Platelet - COMPLETE METABOLIC PANEL WITH GFR - Magnesium - Lipid panel - TSH - Hemoglobin A1c - VITAMIN D 25 Hydroxyl        Discussed  regular exercise, BP monitoring, weight control to achieve/maintain BMI less than 25 and discussed med and SE's. Recommended labs to assess and monitor clinical status with further disposition pending results of labs.  I discussed the assessment and treatment plan with the patient. The patient was provided an opportunity to ask questions and all were answered. The patient agreed with the plan and demonstrated an understanding of the instructions.  I provided over 30 minutes of exam, counseling, chart review and  complex critical decision making. Patient is advised to see her orthopedist for her Lt Hip / Butt pains.  Kirtland Bouchard, MD

## 2019-01-29 NOTE — Patient Instructions (Signed)

## 2019-01-30 ENCOUNTER — Ambulatory Visit (INDEPENDENT_AMBULATORY_CARE_PROVIDER_SITE_OTHER): Payer: Medicare Other | Admitting: Internal Medicine

## 2019-01-30 ENCOUNTER — Other Ambulatory Visit: Payer: Self-pay

## 2019-01-30 VITALS — BP 162/76 | HR 64 | Temp 97.0°F | Resp 16 | Ht 63.5 in | Wt 129.8 lb

## 2019-01-30 DIAGNOSIS — Z794 Long term (current) use of insulin: Secondary | ICD-10-CM

## 2019-01-30 DIAGNOSIS — N1832 Chronic kidney disease, stage 3b: Secondary | ICD-10-CM

## 2019-01-30 DIAGNOSIS — E785 Hyperlipidemia, unspecified: Secondary | ICD-10-CM

## 2019-01-30 DIAGNOSIS — E559 Vitamin D deficiency, unspecified: Secondary | ICD-10-CM

## 2019-01-30 DIAGNOSIS — E039 Hypothyroidism, unspecified: Secondary | ICD-10-CM | POA: Diagnosis not present

## 2019-01-30 DIAGNOSIS — I10 Essential (primary) hypertension: Secondary | ICD-10-CM | POA: Diagnosis not present

## 2019-01-30 DIAGNOSIS — Z79899 Other long term (current) drug therapy: Secondary | ICD-10-CM

## 2019-01-30 DIAGNOSIS — E1121 Type 2 diabetes mellitus with diabetic nephropathy: Secondary | ICD-10-CM | POA: Diagnosis not present

## 2019-01-30 DIAGNOSIS — E1169 Type 2 diabetes mellitus with other specified complication: Secondary | ICD-10-CM

## 2019-01-30 DIAGNOSIS — E1122 Type 2 diabetes mellitus with diabetic chronic kidney disease: Secondary | ICD-10-CM

## 2019-01-31 LAB — COMPLETE METABOLIC PANEL WITH GFR
AG Ratio: 1.4 (calc) (ref 1.0–2.5)
ALT: 16 U/L (ref 6–29)
AST: 22 U/L (ref 10–35)
Albumin: 4 g/dL (ref 3.6–5.1)
Alkaline phosphatase (APISO): 72 U/L (ref 37–153)
BUN/Creatinine Ratio: 19 (calc) (ref 6–22)
BUN: 20 mg/dL (ref 7–25)
CO2: 31 mmol/L (ref 20–32)
Calcium: 10 mg/dL (ref 8.6–10.4)
Chloride: 99 mmol/L (ref 98–110)
Creat: 1.08 mg/dL — ABNORMAL HIGH (ref 0.60–0.88)
GFR, Est African American: 53 mL/min/{1.73_m2} — ABNORMAL LOW (ref >=60)
GFR, Est Non African American: 46 mL/min/{1.73_m2} — ABNORMAL LOW (ref >=60)
Globulin: 2.8 g/dL (calc) (ref 1.9–3.7)
Glucose, Bld: 133 mg/dL — ABNORMAL HIGH (ref 65–99)
Potassium: 4.2 mmol/L (ref 3.5–5.3)
Sodium: 138 mmol/L (ref 135–146)
Total Bilirubin: 0.5 mg/dL (ref 0.2–1.2)
Total Protein: 6.8 g/dL (ref 6.1–8.1)

## 2019-01-31 LAB — CBC WITH DIFFERENTIAL/PLATELET
Absolute Monocytes: 1117 cells/uL — ABNORMAL HIGH (ref 200–950)
Basophils Absolute: 49 cells/uL (ref 0–200)
Basophils Relative: 0.5 %
Eosinophils Absolute: 323 cells/uL (ref 15–500)
Eosinophils Relative: 3.3 %
HCT: 36.6 % (ref 35.0–45.0)
Hemoglobin: 12.2 g/dL (ref 11.7–15.5)
Lymphs Abs: 2813 cells/uL (ref 850–3900)
MCH: 31.5 pg (ref 27.0–33.0)
MCHC: 33.3 g/dL (ref 32.0–36.0)
MCV: 94.6 fL (ref 80.0–100.0)
MPV: 12.8 fL — ABNORMAL HIGH (ref 7.5–12.5)
Monocytes Relative: 11.4 %
Neutro Abs: 5498 cells/uL (ref 1500–7800)
Neutrophils Relative %: 56.1 %
Platelets: 232 10*3/uL (ref 140–400)
RBC: 3.87 10*6/uL (ref 3.80–5.10)
RDW: 12 % (ref 11.0–15.0)
Total Lymphocyte: 28.7 %
WBC: 9.8 10*3/uL (ref 3.8–10.8)

## 2019-01-31 LAB — LIPID PANEL
Cholesterol: 128 mg/dL (ref <200)
HDL: 45 mg/dL — ABNORMAL LOW (ref >=50)
LDL Cholesterol (Calc): 60 mg/dL (calc)
Non-HDL Cholesterol (Calc): 83 mg/dL (calc) (ref <130)
Total CHOL/HDL Ratio: 2.8 (calc) (ref <5.0)
Triglycerides: 148 mg/dL (ref <150)

## 2019-01-31 LAB — HEMOGLOBIN A1C
Hgb A1c MFr Bld: 7.9 % of total Hgb — ABNORMAL HIGH (ref <5.7)
Mean Plasma Glucose: 180 (calc)
eAG (mmol/L): 10 (calc)

## 2019-01-31 LAB — VITAMIN D 25 HYDROXY (VIT D DEFICIENCY, FRACTURES): Vit D, 25-Hydroxy: 67 ng/mL (ref 30–100)

## 2019-01-31 LAB — TSH: TSH: 1.58 mIU/L (ref 0.40–4.50)

## 2019-01-31 LAB — MAGNESIUM: Magnesium: 1.8 mg/dL (ref 1.5–2.5)

## 2019-02-02 ENCOUNTER — Encounter: Payer: Self-pay | Admitting: Internal Medicine

## 2019-02-04 ENCOUNTER — Other Ambulatory Visit: Payer: Self-pay | Admitting: Internal Medicine

## 2019-02-04 MED ORDER — PREDNISONE 20 MG PO TABS: ORAL_TABLET | ORAL | 0 refills | Status: DC

## 2019-02-05 ENCOUNTER — Other Ambulatory Visit: Payer: Self-pay | Admitting: Internal Medicine

## 2019-03-19 DIAGNOSIS — E119 Type 2 diabetes mellitus without complications: Secondary | ICD-10-CM | POA: Diagnosis not present

## 2019-03-19 DIAGNOSIS — H353222 Exudative age-related macular degeneration, left eye, with inactive choroidal neovascularization: Secondary | ICD-10-CM | POA: Diagnosis not present

## 2019-03-19 DIAGNOSIS — H353211 Exudative age-related macular degeneration, right eye, with active choroidal neovascularization: Secondary | ICD-10-CM | POA: Diagnosis not present

## 2019-03-19 DIAGNOSIS — H35351 Cystoid macular degeneration, right eye: Secondary | ICD-10-CM | POA: Diagnosis not present

## 2019-03-27 ENCOUNTER — Telehealth: Payer: Self-pay | Admitting: Internal Medicine

## 2019-03-28 ENCOUNTER — Other Ambulatory Visit: Payer: Self-pay | Admitting: Adult Health

## 2019-04-11 ENCOUNTER — Other Ambulatory Visit: Payer: Self-pay | Admitting: Orthopaedic Surgery

## 2019-04-11 DIAGNOSIS — M47816 Spondylosis without myelopathy or radiculopathy, lumbar region: Secondary | ICD-10-CM

## 2019-04-12 ENCOUNTER — Other Ambulatory Visit: Payer: Self-pay

## 2019-04-12 ENCOUNTER — Ambulatory Visit
Admission: RE | Admit: 2019-04-12 | Discharge: 2019-04-12 | Disposition: A | Discharge status: Home / Self Care | Payer: Medicare Other | Source: Ambulatory Visit | Attending: Orthopaedic Surgery | Admitting: Orthopaedic Surgery

## 2019-04-12 DIAGNOSIS — M47816 Spondylosis without myelopathy or radiculopathy, lumbar region: Secondary | ICD-10-CM

## 2019-04-12 DIAGNOSIS — M545 Low back pain: Secondary | ICD-10-CM | POA: Diagnosis not present

## 2019-04-12 MED ORDER — METHYLPREDNISOLONE ACETATE 40 MG/ML INJ SUSP (RADIOLOG
120.0000 mg | Freq: Once | INTRAMUSCULAR | Status: AC
  Administered 2019-04-12: 120 mg via EPIDURAL

## 2019-04-12 MED ORDER — IOPAMIDOL (ISOVUE-M 200) INJECTION 41%
1.0000 mL | Freq: Once | INTRAMUSCULAR | Status: AC
  Administered 2019-04-12: 1 mL via EPIDURAL

## 2019-04-12 NOTE — Discharge Instructions (Signed)

## 2019-04-15 DIAGNOSIS — M545 Low back pain: Secondary | ICD-10-CM | POA: Diagnosis not present

## 2019-04-16 ENCOUNTER — Other Ambulatory Visit: Payer: Self-pay

## 2019-04-16 ENCOUNTER — Telehealth: Payer: Self-pay

## 2019-04-16 ENCOUNTER — Encounter: Payer: Self-pay | Admitting: Adult Health Nurse Practitioner

## 2019-04-16 ENCOUNTER — Ambulatory Visit: Payer: Medicare Other | Admitting: Adult Health Nurse Practitioner

## 2019-04-16 ENCOUNTER — Ambulatory Visit: Payer: Medicare Other | Attending: Internal Medicine

## 2019-04-16 ENCOUNTER — Ambulatory Visit: Payer: Medicare Other | Admitting: Adult Health

## 2019-04-16 DIAGNOSIS — Z8616 Personal history of COVID-19: Secondary | ICD-10-CM | POA: Insufficient documentation

## 2019-04-16 DIAGNOSIS — Z20822 Contact with and (suspected) exposure to covid-19: Secondary | ICD-10-CM

## 2019-04-16 DIAGNOSIS — Z5329 Procedure and treatment not carried out because of patient's decision for other reasons: Secondary | ICD-10-CM

## 2019-04-16 NOTE — Telephone Encounter (Signed)
Patient states that she has a cough and a bad headache which began yesterday. Requesting a prescription be sent to the pharmacy. Please advise.

## 2019-04-16 NOTE — Progress Notes (Signed)
Virtual Visit via Telephone Note  I connected with [name] on 04/16/19 at  1:30 PM EST by telephone and verified that I am speaking with the correct person using two identifiers.   I discussed the limitations, risks, security and privacy concerns of performing an evaluation and management service by telephone and the availability of in person appointments. I also discussed with the patient that there may be a patient responsible charge related to this service. The patient expressed understanding and agreed to proceed.   Assessment and Plan:   Follow Up Instructions:    I discussed the assessment and treatment plan with the patient. The patient was provided an opportunity to ask questions and all were answered. The patient agreed with the plan and demonstrated an understanding of the instructions.   The patient was advised to call back or seek an in-person evaluation if the symptoms worsen or if the condition fails to improve as anticipated.  I provided 1 minutes of non-face-to-face time during this encounter including counseling, chart review, and critical decision making was preformed.   Future Appointments  Date Time Provider [place]  04/16/2019  1:30 PM Garnet Sierras, NP [facility] None  05/02/2019 10:45 AM Liane Comber, NP [facility] None  08/30/2019 10:00 AM [name], MD [facility] None  12/23/2019  2:00 PM Liane Comber, NP [facility] None      History of Present Illness:  85 y.o. female   Contacted to follow up on office message regarding not feeling well.  She stated she was about to go into a doctors office for an appointment.  She did not sound in any distress.  Conversation was abruptly ended.    Medical History:  Past Medical History:  Diagnosis Date  . Arthritis   . Chronic back pain   . Diabetic neuropathy ([place])   . Hypercholesteremia   . Hypertension   . Liver lesion   . Neuropathy   . Type II or unspecified type diabetes  mellitus without mention of complication, not stated as uncontrolled   . Vitamin D deficiency   . Wears glasses     Current Medications:  Current Outpatient Medications on File Prior to Visit  Medication Sig  . ACCU-CHEK AVIVA PLUS test strip Test 3 times a day  . aspirin 81 MG tablet Take 81 mg by mouth daily.  . ASSURE COMFORT LANCETS 30G MISC Check blood sugar 3 times daily-DX-E11.22  . bisoprolol-hydrochlorothiazide (ZIAC) 5-6.25 MG tablet Take 1/2 to 1 Tablet daily as directed for BP  . Blood Glucose Calibration (ACCU-CHEK AVIVA) SOLN Check blood sugar 3 times daily-DX-E11.22  . buPROPion (WELLBUTRIN XL) 150 MG 24 hr tablet Take 1 tablet every Morning for Mood , Focus & Concentration  . Cholecalciferol (VITAMIN D3) 2000 UNITS TABS Take 1 capsule by mouth 2 (two) times daily.  . COMFORT EZ PEN NEEDLES 32G X 4 MM MISC USE 2 TIMES DAILY AS DIRECTED , 1 PEN NEEDLE PER INJECTION.  Marland Kitchen Continuous Blood Gluc Sensor MISC Check blood sugar 3 times a day- Dx: E11.8/Z79.4  . dorzolamide-timolol (COSOPT) 22.3-6.8 MG/ML ophthalmic solution   . EASY COMFORT INSULIN SYRINGE 30G X 1/2" 0.5 ML MISC   . gabapentin (NEURONTIN) 100 MG capsule Take 1 cap with breakfast and lunch as needed for foot pain; can take 1-2 caps with dinner or prior to bedtime; max total of 4 caps daily by mouth.  . hyoscyamine (LEVSIN SL) 0.125 MG SL tablet Take 1 to 2 tablets 3 to 4 x  day if needed for Nausea, vomiting, cramping or diarrhea  . lactulose (CHRONULAC) 10 GM/15ML solution Take 15 ml (3 tsp) once or twice daily as needed for constipation.  Marland Kitchen latanoprost (XALATAN) 0.005 % ophthalmic solution   . LOTEMAX 0.5 % GEL   . Magnesium 400 MG TABS Take 1 tablet by mouth daily.  . Multiple Vitamins-Minerals (HAIR/SKIN/NAILS) TABS Take by mouth 2 (two) times daily.  Marland Kitchen NOVOLIN 70/30 FLEXPEN RELION (70-30) 100 UNIT/ML PEN INJECT 10 UNITS SUBCUTANEOUSLY WITH BREAKFAST AND 5 UNITS WITH SUPPER AS  DIRECTED  . predniSONE (DELTASONE) 20  MG tablet 1 tab 3 x day for 3 days, then 2 x day for 3 days, then  1 x day for 5 days  . rOPINIRole (REQUIP) 3 MG tablet TAKE 2 TABLETS BY MOUTH AT BEDTIME FOR RESTLESSNESS  . rosuvastatin (CRESTOR) 20 MG tablet Take 1 tablet Daily for Cholesterol  . senna-docusate (SENOKOT-S) 8.6-50 MG tablet Take 1 tablet by mouth daily.  . vitamin B-12 (CYANOCOBALAMIN) 1000 MCG tablet Take 1,000 mcg by mouth daily.  . vitamin C (ASCORBIC ACID) 500 MG tablet Take 500 mg by mouth daily.   No current facility-administered medications on file prior to visit.    Allergies:  Allergies  Allergen Reactions  . Morphine And Related Other (See Comments)    fuzzy feeling      Review of Systems:  ROS   Observations/Objective: General : Well sounding patient in no apparent distress HEENT: no hoarseness, no cough for duration of visit Lungs: speaks in complete sentences, no audible wheezing, no apparent distress Neurological: alert, oriented x 3 Psychiatric: pleasant, judgement appropriate      Garnet Sierras, NP [facility] 04/16/2019  1:28 PM ~

## 2019-04-17 LAB — NOVEL CORONAVIRUS, NAA: SARS-CoV-2, NAA: DETECTED — AB

## 2019-04-18 ENCOUNTER — Telehealth: Payer: Self-pay | Admitting: Unknown Physician Specialty

## 2019-04-18 ENCOUNTER — Other Ambulatory Visit: Payer: Self-pay | Admitting: Unknown Physician Specialty

## 2019-04-18 DIAGNOSIS — U071 COVID-19: Secondary | ICD-10-CM

## 2019-04-18 NOTE — Telephone Encounter (Signed)
  I connected by phone with [name] on 04/18/2019 at 12:05 PM to discuss the potential use of an new treatment for mild to moderate COVID-19 viral infection in non-hospitalized patients.  This patient is a 85 y.o. female that meets the FDA criteria for Emergency Use Authorization of bamlanivimab or casirivimab\imdevimab.  Has a (+) direct SARS-CoV-2 viral test result  Has mild or moderate COVID-19   Is ? 85 years of age and weighs ? 40 kg  Is NOT hospitalized due to COVID-19  Is NOT requiring oxygen therapy or requiring an increase in baseline oxygen flow rate due to COVID-19  Is within 10 days of symptom onset  Has at least one of the high risk factor(s) for progression to severe COVID-19 and/or hospitalization as defined in EUA.  Specific high risk criteria : >/= 83 yo with comorbid conditions     I have spoken and communicated the following to the patient or parent/caregiver:  1. FDA has authorized the emergency use of bamlanivimab and casirivimab\imdevimab for the treatment of mild to moderate COVID-19 in adults and pediatric patients with positive results of direct SARS-CoV-2 viral testing who are 53 years of age and older weighing at least 40 kg, and who are at high risk for progressing to severe COVID-19 and/or hospitalization.  2. The significant known and potential risks and benefits of bamlanivimab and casirivimab\imdevimab, and the extent to which such potential risks and benefits are unknown.  3. Information on available alternative treatments and the risks and benefits of those alternatives, including clinical trials.  4. Patients treated with bamlanivimab and casirivimab\imdevimab should continue to self-isolate and use infection control measures (e.g., wear mask, isolate, social distance, avoid sharing personal items, clean and disinfect "high touch" surfaces, and frequent handwashing) according to CDC guidelines.   5. The patient or parent/caregiver has the  option to accept or refuse bamlanivimab or casirivimab\imdevimab .  After reviewing this information with the patient, The patient agreed to proceed with receiving the bamlanimivab infusion and will be provided a copy of the Fact sheet prior to receiving the infusion.[name] 04/18/2019 12:05 PM  Symptom onset 12/20

## 2019-04-22 ENCOUNTER — Ambulatory Visit (HOSPITAL_COMMUNITY)
Admission: RE | Admit: 2019-04-22 | Discharge: 2019-04-22 | Disposition: A | Discharge status: Home / Self Care | Payer: Medicare Other | Source: Ambulatory Visit | Attending: Pulmonary Disease | Admitting: Pulmonary Disease

## 2019-04-22 DIAGNOSIS — Z23 Encounter for immunization: Secondary | ICD-10-CM | POA: Insufficient documentation

## 2019-04-22 DIAGNOSIS — U071 COVID-19: Secondary | ICD-10-CM

## 2019-04-22 MED ORDER — EPINEPHRINE 0.3 MG/0.3ML IJ SOAJ: 0.3000 mg | Freq: Once | INTRAMUSCULAR | Status: DC | PRN

## 2019-04-22 MED ORDER — FAMOTIDINE IN NACL 20-0.9 MG/50ML-% IV SOLN: 20.0000 mg | Freq: Once | INTRAVENOUS | Status: DC | PRN

## 2019-04-22 MED ORDER — ALBUTEROL SULFATE HFA 108 (90 BASE) MCG/ACT IN AERS: 2.0000 | INHALATION_SPRAY | Freq: Once | RESPIRATORY_TRACT | Status: DC | PRN

## 2019-04-22 MED ORDER — METHYLPREDNISOLONE SODIUM SUCC 125 MG IJ SOLR: 125.0000 mg | Freq: Once | INTRAMUSCULAR | Status: DC | PRN

## 2019-04-22 MED ORDER — SODIUM CHLORIDE 0.9 % IV SOLN: INTRAVENOUS | Status: DC | PRN

## 2019-04-22 MED ORDER — DIPHENHYDRAMINE HCL 50 MG/ML IJ SOLN: 50.0000 mg | Freq: Once | INTRAMUSCULAR | Status: DC | PRN

## 2019-04-22 MED ORDER — SODIUM CHLORIDE 0.9 % IV SOLN
700.0000 mg | Freq: Once | INTRAVENOUS | Status: AC
  Administered 2019-04-22: 09:00:00 700 mg via INTRAVENOUS
  Filled 2019-04-22: qty 20

## 2019-04-22 NOTE — Progress Notes (Signed)
  Diagnosis: COVID-19  Physician: Dr. Joya Gaskins  Procedure: [facility] Med: bamlanivimab infusion - Provided patient with bamlanimivab fact sheet for patients, parents and caregivers prior to infusion.  Complications: No immediate complications noted.  Discharge: Discharged home   [facility] 04/22/2019

## 2019-04-29 ENCOUNTER — Ambulatory Visit: Payer: Medicare Other | Admitting: Physician Assistant

## 2019-04-30 ENCOUNTER — Encounter: Payer: Self-pay | Admitting: *Deleted

## 2019-05-02 ENCOUNTER — Ambulatory Visit: Payer: Medicare Other | Admitting: Adult Health

## 2019-05-06 ENCOUNTER — Telehealth: Payer: Self-pay

## 2019-05-06 DIAGNOSIS — Z794 Long term (current) use of insulin: Secondary | ICD-10-CM

## 2019-05-06 DIAGNOSIS — E118 Type 2 diabetes mellitus with unspecified complications: Secondary | ICD-10-CM

## 2019-05-06 MED ORDER — BLOOD GLUCOSE MONITOR KIT: PACK | 0 refills | Status: DC

## 2019-05-06 NOTE — Telephone Encounter (Signed)
Patient requesting new meter and supplies

## 2019-05-07 ENCOUNTER — Other Ambulatory Visit: Payer: Self-pay | Admitting: Adult Health

## 2019-05-07 ENCOUNTER — Other Ambulatory Visit: Payer: Self-pay

## 2019-05-07 DIAGNOSIS — Z794 Long term (current) use of insulin: Secondary | ICD-10-CM

## 2019-05-07 DIAGNOSIS — E118 Type 2 diabetes mellitus with unspecified complications: Secondary | ICD-10-CM

## 2019-05-07 MED ORDER — BLOOD GLUCOSE MONITOR KIT: PACK | 0 refills | Status: DC

## 2019-05-09 NOTE — Progress Notes (Signed)
FOLLOW UP  Assessment and Plan:   Atherosclerosis of aorta Per CT 06/2017 Control blood pressure, cholesterol, glucose, increase exercise.   Hypertension At goal with current medications  Monitor blood pressure at home; patient to call if consistently greater than 150/90 (maintain higher goal due to hx of stenosis with dizziness) Continue DASH diet.   Reminder to go to the ER if any CP, SOB, nausea, dizziness, severe HA, changes vision/speech, left arm numbness and tingling and jaw pain.  Cholesterol Currently at typical goal of LDL <70 for diabetic; continue statin Continue low cholesterol diet and exercise.  Check lipid panel.   Diabetes with diabetic chronic kidney disease Continue medication: metformin,  lantus 15-20 units BID - discussed these parameters today again, reminded to increase AM insulin if fasting is 180+, call with any concers (has Dr. Idell Pickles # for after hours);  Reviewed checking glucose with new glucometer by CMA Restart checking BID and close food/glucose log Follow up in 1 month if A1C 9+  Continue diet and exercise.  Perform daily foot/skin check, notify office of any concerning changes.  Check A1C  CKD III secondary to T2DM Increase fluids, avoid NSAIDS, monitor sugars, will monitor  Diabetic neuropathy Gabapentin and topicals PRN, discussed feet checks.   Diabetic retinopathy Control glucose; follows with ophth, has upcoming appointment, requested note be forwarded  BMI 20 Continue to recommend diet heavy in fruits and veggies and low in animal meats, cheeses, and dairy products, appropriate calorie intake Discuss exercise recommendations routinely Continue to monitor weight at each visit  Vitamin D Def At goal at last visit; continue supplementation to maintain goal of 60-100 Defer Vit D level to annual CPE   Continue diet and meds as discussed. Further disposition pending results of labs. Discussed med's effects and SE's.   Over 30  minutes of exam, counseling, chart review, and critical decision making was performed.   Future Appointments  Date Time Provider [place]  08/30/2019 10:00 AM [name], MD [facility] None  12/23/2019  2:00 PM Liane Comber, NP [facility] None    ----------------------------------------------------------------------------------------------------------------------  HPI 86 y.o. female  presents for 3 month follow up on hypertension, cholesterol, diabetes with CKD/neuropathy/retinopathy, weight and vitamin D deficiency.   She and husband tested positive for covid 19 on 04/16/2019, underwent monoclonal antibody transfusion on 04/22/2019 and has done fairly.   She is recently on wellbutrin 150 mg daily for mood but reports hasn't been taking, plans to restart soon.   BMI is Body mass index is 20.57 kg/m., she has not been working on diet, reports somewhat poor appetite, exercise has been limited.  Wt Readings from Last 3 Encounters:  05/13/19 118 lb (53.5 kg)  01/30/19 129 lb 12.8 oz (58.9 kg)  01/02/19 123 lb (55.8 kg)   Patient had a negative Cardiolite in 2009.  She has aortic atherosclerosis per Ct 06/2017 Her blood pressure has been controlled at home today their BP is BP: 114/60  She does not workout. She denies chest pain, shortness of breath, dizziness.   She is on cholesterol medication 20 mg daily and denies myalgias. Her cholesterol is at goal of LDL <70, on goal with crestor. The cholesterol last visit was:   Lab Results  Component Value Date   CHOL 128 01/30/2019   HDL 45 (L) 01/30/2019   LDLCALC 60 01/30/2019   TRIG 148 01/30/2019   CHOLHDL 2.8 01/30/2019    She has been working on diet and exercise for T2 diabetes (with CKD III,  painful neuropathy with gabapentin, retinopathy), and denies foot ulcerations, increased appetite, nausea, paresthesia of the feet, polydipsia, polyuria, visual disturbances, vomiting and weight loss. She is off of metformin due  to renal functions; she is taking novolin 70/30 15-20 units BID. She was not taking insulin for a few days this week after she dropped and broke her meter; new meter was picked up a few days ago but hasn't been keeping a log.  She has poor diabetic control and limited health literacy requiring assist with insulin titration.     Last A1C in the office was:  Lab Results  Component Value Date   HGBA1C 7.9 (H) 01/30/2019   She has CKD III associated with T2DM and htn:  Lab Results  Component Value Date   GFRNONAA 46 (L) 01/30/2019   Patient is on Vitamin D supplement and at goal at last check:   Lab Results  Component Value Date   VD25OH 67 01/30/2019      Current Medications:  Current Outpatient Medications on File Prior to Visit  Medication Sig  . ACCU-CHEK AVIVA PLUS test strip Test 3 times a day  . aspirin 81 MG tablet Take 81 mg by mouth daily.  . ASSURE COMFORT LANCETS 30G MISC Check blood sugar 3 times daily-DX-E11.22  . bisoprolol-hydrochlorothiazide (ZIAC) 5-6.25 MG tablet Take 1/2 to 1 Tablet daily as directed for BP  . Blood Glucose Calibration (ACCU-CHEK AVIVA) SOLN Check blood sugar 3 times daily-DX-E11.22  . blood glucose meter kit and supplies KIT Test blood sugar three times a day for Diabetes. Dx:E11.8/ Z79.4 .  Marland Kitchen buPROPion (WELLBUTRIN XL) 150 MG 24 hr tablet Take 1 tablet every Morning for Mood , Focus & Concentration  . Cholecalciferol (VITAMIN D3) 2000 UNITS TABS Take 1 capsule by mouth 2 (two) times daily.  . COMFORT EZ PEN NEEDLES 32G X 4 MM MISC USE 2 TIMES DAILY AS DIRECTED , 1 PEN NEEDLE PER INJECTION.  Marland Kitchen Continuous Blood Gluc Sensor MISC Check blood sugar 3 times a day- Dx: E11.8/Z79.4  . dorzolamide-timolol (COSOPT) 22.3-6.8 MG/ML ophthalmic solution   . EASY COMFORT INSULIN SYRINGE 30G X 1/2" 0.5 ML MISC   . gabapentin (NEURONTIN) 100 MG capsule Take 1 cap with breakfast and lunch as needed for foot pain; can take 1-2 caps with dinner or prior to bedtime; max  total of 4 caps daily by mouth.  . hyoscyamine (LEVSIN SL) 0.125 MG SL tablet Take 1 to 2 tablets 3 to 4 x day if needed for Nausea, vomiting, cramping or diarrhea  . lactulose (CHRONULAC) 10 GM/15ML solution Take 15 ml (3 tsp) once or twice daily as needed for constipation.  Marland Kitchen latanoprost (XALATAN) 0.005 % ophthalmic solution   . LOTEMAX 0.5 % GEL   . Magnesium 400 MG TABS Take 1 tablet by mouth daily.  . Multiple Vitamins-Minerals (HAIR/SKIN/NAILS) TABS Take by mouth 2 (two) times daily.  Marland Kitchen NOVOLIN 70/30 FLEXPEN RELION (70-30) 100 UNIT/ML PEN INJECT 10 UNITS SUBCUTANEOUSLY WITH BREAKFAST AND 5 UNITS WITH SUPPER AS DIRECTED  . rOPINIRole (REQUIP) 3 MG tablet TAKE 2 TABLETS BY MOUTH AT BEDTIME FOR RESTLESSNESS  . rosuvastatin (CRESTOR) 20 MG tablet Take 1 tablet Daily for Cholesterol  . senna-docusate (SENOKOT-S) 8.6-50 MG tablet Take 1 tablet by mouth daily.  . vitamin B-12 (CYANOCOBALAMIN) 1000 MCG tablet Take 1,000 mcg by mouth daily.  . vitamin C (ASCORBIC ACID) 500 MG tablet Take 500 mg by mouth daily.  . predniSONE (DELTASONE) 20 MG tablet 1 tab  3 x day for 3 days, then 2 x day for 3 days, then  1 x day for 5 days   No current facility-administered medications on file prior to visit.     Allergies:  Allergies  Allergen Reactions  . Morphine And Related Other (See Comments)    fuzzy feeling     Medical History:  Past Medical History:  Diagnosis Date  . Arthritis   . Chronic back pain   . Diabetic neuropathy ([place])   . Hypercholesteremia   . Hypertension   . Liver lesion   . Neuropathy   . Type II or unspecified type diabetes mellitus without mention of complication, not stated as uncontrolled   . Vitamin D deficiency   . Wears glasses    Family history- Reviewed and unchanged Social history- Reviewed and unchanged   Review of Systems:  Review of Systems  Constitutional: Negative for malaise/fatigue and weight loss.  HENT: Negative for hearing loss and tinnitus.    Eyes: Negative for blurred vision and double vision.  Respiratory: Negative for cough, shortness of breath and wheezing.   Cardiovascular: Negative for chest pain, palpitations, orthopnea, claudication and leg swelling.  Gastrointestinal: Negative for abdominal pain, blood in stool, constipation, diarrhea, heartburn, melena, nausea and vomiting.  Genitourinary: Negative.   Musculoskeletal: Negative for joint pain and myalgias.  Skin: Negative for rash.  Neurological: Negative for dizziness, tingling, sensory change, weakness and headaches.  Endo/Heme/Allergies: Negative for polydipsia.  Psychiatric/Behavioral: Negative.   All other systems reviewed and are negative.   Physical Exam: BP 114/60   Pulse 68   Temp 97.9 F (36.6 C)   Wt 118 lb (53.5 kg)   SpO2 95%   BMI 20.57 kg/m  Wt Readings from Last 3 Encounters:  05/13/19 118 lb (53.5 kg)  01/30/19 129 lb 12.8 oz (58.9 kg)  01/02/19 123 lb (55.8 kg)   General Appearance: Well nourished, in no apparent distress. Eyes: PERRLA, EOMs, conjunctiva no swelling or erythema Sinuses: No Frontal/maxillary tenderness ENT/Mouth: Ext aud canals clear, TMs without erythema, bulging. No erythema, swelling, or exudate on post pharynx.  Tonsils not swollen or erythematous. Hearing normal.  Neck: Supple, thyroid normal.  Respiratory: Respiratory effort normal, BS equal bilaterally without rales, rhonchi, wheezing or stridor.  Cardio: RRR with no MRGs. Brisk peripheral pulses without edema.  Abdomen: Soft, + BS.  Non tender, no guarding, rebound, hernias, masses. Lymphatics: Non tender without lymphadenopathy.  Musculoskeletal: Full ROM, 5/5 strength, Normal gait Skin: Warm, dry without rashes, lesions, ecchymosis.  Neuro: Cranial nerves intact. No cerebellar symptoms.  Psych: Awake and oriented X 3, normal affect, Insight and Judgment appropriate. Memory/comprehension somewhat poor.   Izora Ribas, NP 4:00 PM [facility]

## 2019-05-13 ENCOUNTER — Other Ambulatory Visit: Payer: Self-pay

## 2019-05-13 ENCOUNTER — Ambulatory Visit (INDEPENDENT_AMBULATORY_CARE_PROVIDER_SITE_OTHER): Payer: Medicare Other | Admitting: Adult Health

## 2019-05-13 ENCOUNTER — Encounter: Payer: Self-pay | Admitting: Adult Health

## 2019-05-13 VITALS — BP 114/60 | HR 68 | Temp 97.9°F | Wt 118.0 lb

## 2019-05-13 DIAGNOSIS — N183 Chronic kidney disease, stage 3 unspecified: Secondary | ICD-10-CM

## 2019-05-13 DIAGNOSIS — E1140 Type 2 diabetes mellitus with diabetic neuropathy, unspecified: Secondary | ICD-10-CM

## 2019-05-13 DIAGNOSIS — E11319 Type 2 diabetes mellitus with unspecified diabetic retinopathy without macular edema: Secondary | ICD-10-CM

## 2019-05-13 DIAGNOSIS — E1122 Type 2 diabetes mellitus with diabetic chronic kidney disease: Secondary | ICD-10-CM

## 2019-05-13 DIAGNOSIS — E1169 Type 2 diabetes mellitus with other specified complication: Secondary | ICD-10-CM

## 2019-05-13 DIAGNOSIS — Z794 Long term (current) use of insulin: Secondary | ICD-10-CM

## 2019-05-13 DIAGNOSIS — Z79899 Other long term (current) drug therapy: Secondary | ICD-10-CM

## 2019-05-13 DIAGNOSIS — I700 Atherosclerosis of aorta: Secondary | ICD-10-CM | POA: Diagnosis not present

## 2019-05-13 DIAGNOSIS — I10 Essential (primary) hypertension: Secondary | ICD-10-CM | POA: Diagnosis not present

## 2019-05-13 DIAGNOSIS — E559 Vitamin D deficiency, unspecified: Secondary | ICD-10-CM

## 2019-05-13 DIAGNOSIS — E785 Hyperlipidemia, unspecified: Secondary | ICD-10-CM | POA: Diagnosis not present

## 2019-05-13 DIAGNOSIS — E118 Type 2 diabetes mellitus with unspecified complications: Secondary | ICD-10-CM

## 2019-05-13 DIAGNOSIS — F341 Dysthymic disorder: Secondary | ICD-10-CM

## 2019-05-13 MED ORDER — NOVOLIN 70/30 FLEXPEN RELION (70-30) 100 UNIT/ML ~~LOC~~ SUPN: PEN_INJECTOR | SUBCUTANEOUS | 0 refills | Status: DC

## 2019-05-13 NOTE — Patient Instructions (Addendum)
Goals    . HEMOGLOBIN A1C < 8.0     Continue to keep track of your food and insulin Suggest checking your sugar BEFORE breakfast, luch & dinner and bring that in with you Continue to check your sugars regularly and  BRING IN YOUR LOG    Insulin regimen (novolin 70/30) Breakfast: 18 units with breakfast unless blood sugar low, then skip and check sugar before lunch, if 180+ take 20 units  Dinner:  take 15 if blood sugar low or eating a light dinner Take 18 units if blood sugar normal/high or having large dinner  Continue calling with reports of glucose if unusually low or high  STOP processed biscuits, sweets, white rice, crackers, fruit juice, cereal  A LOW SUGAR IS MORE DANGEROUS THAN A HIGH SUGAR    . LDL CALC < 70     Take rosuvastatin daily in the evening for cholesterol        Take wellbutrin for mood in the morning -  Please make sure to get your diabetes eye exam for the year with Dr. Zadie Rhine and have that forwarded to [place]             Bad carbs also include fruit juice, alcohol, and sweet tea. These are empty calories that do not signal to your brain that you are full.   Please remember the good carbs are still carbs which convert into sugar. So please measure them out no more than 1/2-1 cup of rice, oatmeal, pasta, and beans  Veggies are however free foods! Pile them on.   Not all fruit is created equal. Please see the list below, the fruit at the bottom is higher in sugars than the fruit at the top. Please avoid all dried fruits.

## 2019-05-14 LAB — CBC WITH DIFFERENTIAL/PLATELET
Absolute Monocytes: 1123 cells/uL — ABNORMAL HIGH (ref 200–950)
Basophils Absolute: 58 cells/uL (ref 0–200)
Basophils Relative: 0.6 %
Eosinophils Absolute: 278 cells/uL (ref 15–500)
Eosinophils Relative: 2.9 %
HCT: 39.8 % (ref 35.0–45.0)
Hemoglobin: 13.2 g/dL (ref 11.7–15.5)
Lymphs Abs: 2554 cells/uL (ref 850–3900)
MCH: 30.9 pg (ref 27.0–33.0)
MCHC: 33.2 g/dL (ref 32.0–36.0)
MCV: 93.2 fL (ref 80.0–100.0)
MPV: 12.4 fL (ref 7.5–12.5)
Monocytes Relative: 11.7 %
Neutro Abs: 5587 cells/uL (ref 1500–7800)
Neutrophils Relative %: 58.2 %
Platelets: 259 10*3/uL (ref 140–400)
RBC: 4.27 10*6/uL (ref 3.80–5.10)
RDW: 12 % (ref 11.0–15.0)
Total Lymphocyte: 26.6 %
WBC: 9.6 10*3/uL (ref 3.8–10.8)

## 2019-05-14 LAB — LIPID PANEL
Cholesterol: 160 mg/dL (ref <200)
HDL: 36 mg/dL — ABNORMAL LOW (ref >=50)
LDL Cholesterol (Calc): 87 mg/dL (calc)
Non-HDL Cholesterol (Calc): 124 mg/dL (calc) (ref <130)
Total CHOL/HDL Ratio: 4.4 (calc) (ref <5.0)
Triglycerides: 279 mg/dL — ABNORMAL HIGH (ref <150)

## 2019-05-14 LAB — COMPLETE METABOLIC PANEL WITH GFR
AG Ratio: 1.2 (calc) (ref 1.0–2.5)
ALT: 34 U/L — ABNORMAL HIGH (ref 6–29)
AST: 23 U/L (ref 10–35)
Albumin: 3.8 g/dL (ref 3.6–5.1)
Alkaline phosphatase (APISO): 89 U/L (ref 37–153)
BUN/Creatinine Ratio: 19 (calc) (ref 6–22)
BUN: 27 mg/dL — ABNORMAL HIGH (ref 7–25)
CO2: 32 mmol/L (ref 20–32)
Calcium: 10.1 mg/dL (ref 8.6–10.4)
Chloride: 99 mmol/L (ref 98–110)
Creat: 1.39 mg/dL — ABNORMAL HIGH (ref 0.60–0.88)
GFR, Est African American: 39 mL/min/{1.73_m2} — ABNORMAL LOW (ref >=60)
GFR, Est Non African American: 34 mL/min/{1.73_m2} — ABNORMAL LOW (ref >=60)
Globulin: 3.1 g/dL (calc) (ref 1.9–3.7)
Glucose, Bld: 165 mg/dL — ABNORMAL HIGH (ref 65–99)
Potassium: 5 mmol/L (ref 3.5–5.3)
Sodium: 138 mmol/L (ref 135–146)
Total Bilirubin: 0.3 mg/dL (ref 0.2–1.2)
Total Protein: 6.9 g/dL (ref 6.1–8.1)

## 2019-05-14 LAB — HEMOGLOBIN A1C
Hgb A1c MFr Bld: 10.5 % of total Hgb — ABNORMAL HIGH (ref <5.7)
Mean Plasma Glucose: 255 (calc)
eAG (mmol/L): 14.1 (calc)

## 2019-05-14 LAB — TSH: TSH: 0.25 mIU/L — ABNORMAL LOW (ref 0.40–4.50)

## 2019-05-14 LAB — MAGNESIUM: Magnesium: 2 mg/dL (ref 1.5–2.5)

## 2019-05-16 NOTE — Telephone Encounter (Signed)
error 

## 2019-05-20 DIAGNOSIS — H353211 Exudative age-related macular degeneration, right eye, with active choroidal neovascularization: Secondary | ICD-10-CM | POA: Diagnosis not present

## 2019-05-20 DIAGNOSIS — H353222 Exudative age-related macular degeneration, left eye, with inactive choroidal neovascularization: Secondary | ICD-10-CM | POA: Diagnosis not present

## 2019-05-20 DIAGNOSIS — H35351 Cystoid macular degeneration, right eye: Secondary | ICD-10-CM | POA: Diagnosis not present

## 2019-05-20 DIAGNOSIS — E119 Type 2 diabetes mellitus without complications: Secondary | ICD-10-CM | POA: Diagnosis not present

## 2019-05-22 DIAGNOSIS — M79672 Pain in left foot: Secondary | ICD-10-CM | POA: Diagnosis not present

## 2019-06-10 DIAGNOSIS — Z794 Long term (current) use of insulin: Secondary | ICD-10-CM | POA: Insufficient documentation

## 2019-06-10 NOTE — Progress Notes (Deleted)
Patient ID: [name], female   DOB: April 16, 1930, 86 y.o.   MRN: [id number]  MEDICARE ANNUAL WELLNESS VISIT AND OV  Assessment:    Encounter for Medicare annual wellness exam  Essential hypertension - continue medications, DASH diet, exercise and monitor at home. Call if greater than 130/80. -     CBC with Differential/Platelet -     CMP/GFR -     TSH  Uncontrolled secondary diabetes mellitus with stage 3 CKD (GFR 30-59) (HCC) Discussed general issues about diabetes pathophysiology and management., Educational material distributed., Suggested low cholesterol diet., Encouraged aerobic exercise., Discussed foot care., Reminded to get yearly retinal exam. Recent glucose log improved; continue current 15-18 unit BID dosing, 20 units PRN for severe elevations (fasting 180+, or 300+ in PM) Follow up closer if needed or glucose concerns -     Hemoglobin A1c  Controlled type 2 diabetes with neuropathy ([place]) Discussed general issues about diabetes pathophysiology and management., Educational material distributed., Suggested low cholesterol diet., Encouraged aerobic exercise., Discussed foot care., Reminded to get yearly retinal exam. -     Hemoglobin A1c  Diabetic retinopathy associated with type 2 diabetes mellitus, macular edema presence unspecified, unspecified laterality, unspecified retinopathy severity ([place]) Discussed general issues about diabetes pathophysiology and management., Educational material distributed., Suggested low cholesterol diet., Encouraged aerobic exercise., Discussed foot care., Reminded to get yearly retinal exam. -     Hemoglobin A1c  Mixed hyperlipidemia -continue medications, check lipids, decrease fatty foods, increase activity.  -     Lipid panel  Chronic pain syndrome monitor  Medication management -     Magnesium  Vitamin D deficiency At goal at recent check; continue to recommend supplementation for goal of 60-100 Defer vitamin D  level  Atherosclerosis of aorta (HCC) Control blood pressure, cholesterol, glucose, increase exercise.  -     Lipid panel -     Hemoglobin A1c  Medication management -     CBC with Differential/Platelet -     COMPLETE METABOLIC PANEL WITH GFR -     Magnesium  Estrogen def She is interested in bone density; ordered to schedule at breast center   Dysthymia Trial low dose wellbutrin for energy and motivation  Lifestyle discussed: diet/exerise, sleep hygiene, stress management, hydration   Comprehensive care plan:  Patient/caregiver was given comprehensive care plan We will continue to monitor these goals every 3 months with an office visit and every month by a telephone call  Patient can contact the office any time with the phone number and get to a provider via the answering service or they can use Mychart.   Verbal permission was received from the patient to review comprehensive care management, they understand they have the right to stop CCM services at any time.   The patient is self managing medications at home.  Medications were reviewed with the patient today as well as adherence and potential interactions.   The patient does not need home health services at this time.   Future Appointments  Date Time Provider [place]  06/11/2019 10:00 AM Vicie Mutters, PA-C [facility] None  06/24/2019 10:30 AM Liane Comber, NP [facility] None  08/30/2019 10:00 AM [name], MD [facility] None  12/23/2019  2:00 PM Liane Comber, NP [facility] None      Plan:   During the course of the visit the patient was educated and counseled about appropriate screening and preventive services including:    Pneumococcal vaccine   Influenza vaccine  Td vaccine  Screening  electrocardiogram  Bone densitometry screening  Colorectal cancer screening  Diabetes screening  Glaucoma screening  Nutrition counseling   Advanced directives: requested   Subjective:    [name]  presents for Medicare Annual Wellness Visit and follow up for HTN, chol, DM with CKD/retinopathy/neuropathy.   She feels steady on feet in home but some difficulty on uneven ground, has cane and does well when she uses it.   She and husband tested positive for covid 19 on 04/16/2019, underwent monoclonal antibody transfusion on 04/22/2019 and has done fairly.   She is recently on wellbutrin 150 mg daily for depression.   BMI is There is no height or weight on file to calculate BMI. Wt Readings from Last 3 Encounters:  05/13/19 118 lb (53.5 kg)  01/30/19 129 lb 12.8 oz (58.9 kg)  01/02/19 123 lb (55.8 kg)   Patient is treated for HTN since 2000 & BP has been controlled at home.     Patient had a negative Cardiolite in 2009.  Aortic atherosclerosis on CT 2019.  Patient has had no complaints of any cardiac type chest pain, palpitations, dyspnea/orthopnea/PND, dizziness, claudication, or dependent edema.  Also, the patient has history of T2_IDDM x 1996 insulin since 2000  Hyperlipdemia- not at goal of less than 70- on crestor 40m  w/ CKD stage 3- not on metformin, not on ACE/ARB  retinopathy Dr. GMorene CrockerNeuropathy she is on novolin 70/30 15-18 units BID, occasionally will take 20 units if very elevated  She has had no symptoms of reactive hypoglycemia, diabetic polys or visual blurring.  She does report tingling and occasional painful paresthesias of the feet.  She takes requip for RLS, reports hemp oil helping with foot pain.  Lab Results  Component Value Date   HGBA1C 10.5 (H) 05/13/2019   The cholesterol last visit was:   Lab Results  Component Value Date   CHOL 160 05/13/2019   HDL 36 (L) 05/13/2019   LDLCALC 87 05/13/2019   TRIG 279 (H) 05/13/2019   CHOLHDL 4.4 05/13/2019   Lab Results  Component Value Date   GFRNONAA 34 (L) 05/13/2019   Further, the patient also has history of Vitamin D Deficiency, on supplements.  Lab Results  Component  Value Date   VD25OH 67 01/30/2019      Current Outpatient Medications (Endocrine & Metabolic):  .Marland Kitchen Insulin Isophane & Regular Human (NOVOLIN 70/30 FLEXPEN RELION) (70-30) 100 UNIT/ML PEN, Inject 15-20 units subcutaneously with breakfast and dinner as directed for diabetes/high blood sugar.  Current Outpatient Medications (Cardiovascular):  .  bisoprolol-hydrochlorothiazide (ZIAC) 5-6.25 MG tablet, Take 1/2 to 1 Tablet daily as directed for BP .  rosuvastatin (CRESTOR) 20 MG tablet, Take 1 tablet Daily for Cholesterol   Current Outpatient Medications (Analgesics):  .  aspirin 81 MG tablet, Take 81 mg by mouth daily.  Current Outpatient Medications (Hematological):  .  vitamin B-12 (CYANOCOBALAMIN) 1000 MCG tablet, Take 1,000 mcg by mouth daily.  Current Outpatient Medications (Other):  .Marland Kitchen ACCU-CHEK AVIVA PLUS test strip, Test 3 times a day .  ASSURE COMFORT LANCETS 30G MISC, Check blood sugar 3 times daily-DX-E11.22 .  Blood Glucose Calibration (ACCU-CHEK AVIVA) SOLN, Check blood sugar 3 times daily-DX-E11.22 .  blood glucose meter kit and supplies KIT, Test blood sugar three times a day for Diabetes. Dx:E11.8/ Z79.4 . .Marland Kitchen buPROPion (WELLBUTRIN XL) 150 MG 24 hr tablet, Take 1 tablet every Morning for Mood , Focus & Concentration .  Cholecalciferol (VITAMIN D3) 2000  UNITS TABS, Take 1 capsule by mouth 2 (two) times daily. .  COMFORT EZ PEN NEEDLES 32G X 4 MM MISC, USE 2 TIMES DAILY AS DIRECTED , 1 PEN NEEDLE PER INJECTION. Marland Kitchen  Continuous Blood Gluc Sensor MISC, Check blood sugar 3 times a day- Dx: E11.8/Z79.4 .  dorzolamide-timolol (COSOPT) 22.3-6.8 MG/ML ophthalmic solution,  .  EASY COMFORT INSULIN SYRINGE 30G X 1/2" 0.5 ML MISC,  .  gabapentin (NEURONTIN) 100 MG capsule, Take 1 cap with breakfast and lunch as needed for foot pain; can take 1-2 caps with dinner or prior to bedtime; max total of 4 caps daily by mouth. .  hyoscyamine (LEVSIN SL) 0.125 MG SL tablet, Take 1 to 2 tablets 3  to 4 x day if needed for Nausea, vomiting, cramping or diarrhea .  lactulose (CHRONULAC) 10 GM/15ML solution, Take 15 ml (3 tsp) once or twice daily as needed for constipation. Marland Kitchen  latanoprost (XALATAN) 0.005 % ophthalmic solution,  .  LOTEMAX 0.5 % GEL,  .  Magnesium 400 MG TABS, Take 1 tablet by mouth daily. .  Multiple Vitamins-Minerals (HAIR/SKIN/NAILS) TABS, Take by mouth 2 (two) times daily. Marland Kitchen  rOPINIRole (REQUIP) 3 MG tablet, TAKE 2 TABLETS BY MOUTH AT BEDTIME FOR RESTLESSNESS .  senna-docusate (SENOKOT-S) 8.6-50 MG tablet, Take 1 tablet by mouth daily. .  vitamin C (ASCORBIC ACID) 500 MG tablet, Take 500 mg by mouth daily.  Patient Active Problem List   Diagnosis Date Noted  . Long term (current) use of insulin ([place]) 06/10/2019  . COVID-19 04/16/2019  . Depression, major, recurrent, in partial remission ([place]) 12/20/2018  . Type 2 diabetes mellitus with complication, with long-term current use of insulin ([place]) 02/09/2017  . Atherosclerosis of aorta ([place]) 11/06/2016  . Diabetic retinopathy ([place]) 05/18/2015  . Encounter for Medicare annual wellness exam 05/18/2015  . Medication management 08/06/2013  . Neuropathy due to type 2 diabetes mellitus ([place]) 06/04/2013  . CKD stage 3 due to type 2 diabetes mellitus ([place])   . Hyperlipidemia associated with type 2 diabetes mellitus ([place])   . Vitamin D deficiency   . Hypertension 07/29/2012  . Chronic pain 07/29/2012    Screening Tests  Immunization History  Administered Date(s) Administered  . DT (Pediatric) 01/04/2011  . Influenza Split 01/23/2013  . Influenza, High Dose Seasonal PF 02/27/2014, 03/11/2015, 12/21/2015, 01/30/2017, 02/08/2018, 01/02/2019  . Pneumococcal Conjugate-13 02/27/2014  . Pneumococcal Polysaccharide-23 01/06/2012   Preventative care: Last colonoscopy: 2006 will not get another due to age [facility] 10/2018 CXR 11/2015 DEXA 2007 patient does want Echo 2014  Lumbar 2018 MRA 2014  Tetanus 2012 Flu 2019 Pneumo  2013 Prevnar 13 2015 Shingles vaccine declines  Names of Other Physician/Practitioners you currently use: 1. [facility] here for primary care 2. [facility], eye doctor, last visit in 02/12/2018 report abstracted  3. Dr Sissy Hoff, DDS, dentist, last visit has scheduled 01/2019  Patient Care Team: [name], MD as PCP - General (Internal Medicine) Warden Fillers, MD as Consulting Physician (Optometry) Zadie Rhine Clent Demark, MD as Consulting Physician (Ophthalmology) Glenna Fellows, MD as Attending Physician (Neurosurgery) Melrose Nakayama, MD as Consulting Physician (Orthopedic Surgery) Milus Banister, MD as Attending Physician (Gastroenterology)   Allergies Allergies  Allergen Reactions  . Morphine And Related Other (See Comments)    fuzzy feeling    SURGICAL HISTORY She  has a past surgical history that includes Abdominal hysterectomy; Cesarean section; Back surgery (1993); Appendectomy; Colonoscopy; Eye surgery; Trigger finger  release (01/10/2012); and Breast excisional biopsy (Right, 1967). FAMILY HISTORY Her family history includes Diabetes in her sister; Liver disease in her sister. SOCIAL HISTORY She  reports that she has never smoked. She has never used smokeless tobacco. She reports that she does not drink alcohol or use drugs.  MEDICARE WELLNESS OBJECTIVES: Physical activity:   Cardiac risk factors:   Depression/mood screen:   Depression screen [facility] 2/9 12/20/2018  Decreased Interest 1  Down, Depressed, Hopeless 1  PHQ - 2 Score 2  Altered sleeping 0  Tired, decreased energy 2  Change in appetite 0  Feeling bad or failure about yourself  0  Trouble concentrating 1  Moving slowly or fidgety/restless 0  Suicidal thoughts 0  PHQ-9 Score 5  Difficult doing work/chores Somewhat difficult    ADLs:  In your present state of health, do you have any difficulty performing the following activities: 02/02/2019 12/20/2018   Hearing? N N  Vision? N N  Difficulty concentrating or making decisions? N N  Comment - -  Walking or climbing stairs? N N  Dressing or bathing? N N  Doing errands, shopping? N N  Preparing Food and eating ? - N  Using the Toilet? - N  In the past six months, have you accidently leaked urine? - N  Do you have problems with loss of bowel control? - N  Managing your Medications? - N  Managing your Finances? - N  Housekeeping or managing your Housekeeping? - N  Some recent data might be hidden     Cognitive Testing  Alert? Yes  Normal Appearance?Yes  Oriented to person? Yes  Place? Yes   Time? Yes  Recall of three objects?  1/3   Can perform simple calculations? Yes  Displays appropriate judgment?Yes  Can read the correct time from a watch face?Yes  EOL planning:     Objective:     There were no vitals taken for this visit.   General Appearance: Well nourished, alert, WD/WN, female and in no apparent distress. Eyes: PERRLA, EOMs, conjunctiva no swelling or erythema, normal fundi and vessels. Sinuses: No frontal/maxillary tenderness ENT/Mouth: EACs patent / TMs  nl. Nares clear without erythema, swelling, mucoid exudates. Oral hygiene is good. No erythema, swelling, or exudate. Tongue normal, non-obstructing. Tonsils not swollen or erythematous. Hearing normal.  Neck: Supple, thyroid normal. No bruits, nodes or JVD. Respiratory: Respiratory effort normal.  BS equal and clear bilateral without rales, rhonci, wheezing or stridor. Cardio: Heart sounds are normal with regular rate and rhythm and no murmurs, rubs or gallops. Peripheral pulses are normal and equal bilaterally without edema. No aortic or femoral bruits. Chest: symmetric with normal excursions and percussion. Abdomen: Flat, soft  with nl bowel sounds. Nontender, no guarding, rebound, hernias, masses, or organomegaly.  Lymphatics: Non tender without lymphadenopathy.  Musculoskeletal: Full ROM all peripheral  extremities, joint stability, 5/5 strength, and normal gait. Skin: right flank with dry skin, excoriations, fine papules, no vesicles.  Neuro: Cranial nerves intact. DTR's UE  Nl/Equal and DTR's LE's absent.  Normal muscle tone, no cerebellar symptoms. Sensation decreased bilateral feet. Pysch: Alert and oriented X 3, normal affect, Insight and Judgment appropriate.    Medicare Attestation I have personally reviewed: The patient's medical and social history Their use of alcohol, tobacco or illicit drugs Their current medications and supplements The patient's functional ability including ADLs,fall risks, home safety risks, cognitive, and hearing and visual impairment Diet and physical activities Evidence for depression or mood disorders  The  patient's weight, height, BMI, and visual acuity have been recorded in the chart.  I have made referrals, counseling, and provided education to the patient based on review of the above and I have provided the patient with a written personalized care plan for preventive services.  Over 40 minutes of exam, counseling, chart review was performed.  Vicie Mutters, PA-C   06/10/2019

## 2019-06-10 NOTE — Progress Notes (Deleted)
Patient ID: [name], female   DOB: Aug 28, 1929, 86 y.o.   MRN: [id number]  MEDICARE ANNUAL WELLNESS VISIT AND OV  Assessment:    Encounter for Medicare annual wellness exam  Essential hypertension - continue medications, DASH diet, exercise and monitor at home. Call if greater than 130/80. -     CBC with Differential/Platelet -     CMP/GFR -     TSH  Uncontrolled secondary diabetes mellitus with stage 3 CKD (GFR 30-59) (HCC) Discussed general issues about diabetes pathophysiology and management., Educational material distributed., Suggested low cholesterol diet., Encouraged aerobic exercise., Discussed foot care., Reminded to get yearly retinal exam. Recent glucose log improved; continue current 15-18 unit BID dosing, 20 units PRN for severe elevations (fasting 180+, or 300+ in PM) Follow up closer if needed or glucose concerns -     Hemoglobin A1c  Controlled type 2 diabetes with neuropathy ([place]) Discussed general issues about diabetes pathophysiology and management., Educational material distributed., Suggested low cholesterol diet., Encouraged aerobic exercise., Discussed foot care., Reminded to get yearly retinal exam. -     Hemoglobin A1c  Diabetic retinopathy associated with type 2 diabetes mellitus, macular edema presence unspecified, unspecified laterality, unspecified retinopathy severity ([place]) Discussed general issues about diabetes pathophysiology and management., Educational material distributed., Suggested low cholesterol diet., Encouraged aerobic exercise., Discussed foot care., Reminded to get yearly retinal exam. -     Hemoglobin A1c  Mixed hyperlipidemia -continue medications, check lipids, decrease fatty foods, increase activity.  -     Lipid panel  Chronic pain syndrome monitor  Medication management -     Magnesium  Vitamin D deficiency At goal at recent check; continue to recommend supplementation for goal of 60-100 Defer vitamin D  level  Atherosclerosis of aorta (HCC) Control blood pressure, cholesterol, glucose, increase exercise.  -     Lipid panel -     Hemoglobin A1c  Medication management -     CBC with Differential/Platelet -     COMPLETE METABOLIC PANEL WITH GFR -     Magnesium  Estrogen def She is interested in bone density; ordered to schedule at breast center   Dysthymia Trial low dose wellbutrin for energy and motivation  Lifestyle discussed: diet/exerise, sleep hygiene, stress management, hydration   Comprehensive care plan:  Patient/caregiver was given comprehensive care plan We will continue to monitor these goals every 3 months with an office visit and every month by a telephone call  Patient can contact the office any time with the phone number and get to a provider via the answering service or they can use Mychart.   Verbal permission was received from the patient to review comprehensive care management, they understand they have the right to stop CCM services at any time.   The patient is self managing medications at home.  Medications were reviewed with the patient today as well as adherence and potential interactions.   The patient does not need home health services at this time.   Future Appointments  Date Time Provider [place]  06/11/2019 11:30 AM Vicie Mutters, PA-C [facility] None  06/24/2019 10:30 AM Liane Comber, NP [facility] None  08/30/2019 10:00 AM [name], MD [facility] None  12/23/2019  2:00 PM Liane Comber, NP [facility] None      Plan:   During the course of the visit the patient was educated and counseled about appropriate screening and preventive services including:    Pneumococcal vaccine   Influenza vaccine  Td vaccine  Screening  electrocardiogram  Bone densitometry screening  Colorectal cancer screening  Diabetes screening  Glaucoma screening  Nutrition counseling   Advanced directives: requested   Subjective:    [name]  presents for Medicare Annual Wellness Visit and follow up for HTN, chol, DM with CKD/retinopathy/neuropathy.   She feels steady on feet in home but some difficulty on uneven ground, has cane and does well when she uses it.   She and husband tested positive for covid 19 on 04/16/2019, underwent monoclonal antibody transfusion on 04/22/2019 and has done fairly.   She is recently on wellbutrin 150 mg daily for depression.    BMI is There is no height or weight on file to calculate BMI., she has been working on diet but hasn't been exercising, lack of energy and no safe walking around her house. She could go to church and walk in their gym, plans to go with husband or friend.  Wt Readings from Last 3 Encounters:  05/13/19 118 lb (53.5 kg)  01/30/19 129 lb 12.8 oz (58.9 kg)  01/02/19 123 lb (55.8 kg)   Patient is treated for HTN since 2000 & BP has been controlled at home.     Patient had a negative Cardiolite in 2009.  She has aortic atherosclerosis per Ct 06/2017 Patient has had no complaints of any cardiac type chest pain, palpitations, dyspnea/orthopnea/PND, dizziness, claudication, or dependent edema.  Also, the patient has history of T2_IDDM x 1996 on insulin since 2000  CKD stage 3b- not on metformin, not on ACE or ARB Hyperlipidemia not at goal of less than 70 on crestor 73m Retinopathy- Groat and Rankin Neuropathy she is on novolin 70/30 15-18 units BID, occasionally will take 20 units if very elevated  She has had no symptoms of reactive hypoglycemia, diabetic polys or visual blurring.  She does report tingling and occasional painful paresthesias of the feet.  She takes requip for RLS, reports hemp oil helping with foot pain.  Lab Results  Component Value Date   HGBA1C 10.5 (H) 05/13/2019   Lab Results  Component Value Date   CHOL 160 05/13/2019   HDL 36 (L) 05/13/2019   LDLCALC 87 05/13/2019   TRIG 279 (H) 05/13/2019   CHOLHDL 4.4 05/13/2019   Lab  Results  Component Value Date   GFRNONAA 34 (L) 05/13/2019   Further, the patient also has history of Vitamin D Deficiency, on supplements.  Lab Results  Component Value Date   VD25OH 67 01/30/2019      Current Outpatient Medications (Endocrine & Metabolic):  .Marland Kitchen Insulin Isophane & Regular Human (NOVOLIN 70/30 FLEXPEN RELION) (70-30) 100 UNIT/ML PEN, Inject 15-20 units subcutaneously with breakfast and dinner as directed for diabetes/high blood sugar.  Current Outpatient Medications (Cardiovascular):  .  bisoprolol-hydrochlorothiazide (ZIAC) 5-6.25 MG tablet, Take 1/2 to 1 Tablet daily as directed for BP .  rosuvastatin (CRESTOR) 20 MG tablet, Take 1 tablet Daily for Cholesterol   Current Outpatient Medications (Analgesics):  .  aspirin 81 MG tablet, Take 81 mg by mouth daily.  Current Outpatient Medications (Hematological):  .  vitamin B-12 (CYANOCOBALAMIN) 1000 MCG tablet, Take 1,000 mcg by mouth daily.  Current Outpatient Medications (Other):  .Marland Kitchen ACCU-CHEK AVIVA PLUS test strip, Test 3 times a day .  ASSURE COMFORT LANCETS 30G MISC, Check blood sugar 3 times daily-DX-E11.22 .  Blood Glucose Calibration (ACCU-CHEK AVIVA) SOLN, Check blood sugar 3 times daily-DX-E11.22 .  blood glucose meter kit and supplies KIT, Test blood sugar three times  a day for Diabetes. Dx:E11.8/ Z79.4 . Marland Kitchen  buPROPion (WELLBUTRIN XL) 150 MG 24 hr tablet, Take 1 tablet every Morning for Mood , Focus & Concentration .  Cholecalciferol (VITAMIN D3) 2000 UNITS TABS, Take 1 capsule by mouth 2 (two) times daily. .  COMFORT EZ PEN NEEDLES 32G X 4 MM MISC, USE 2 TIMES DAILY AS DIRECTED , 1 PEN NEEDLE PER INJECTION. Marland Kitchen  Continuous Blood Gluc Sensor MISC, Check blood sugar 3 times a day- Dx: E11.8/Z79.4 .  dorzolamide-timolol (COSOPT) 22.3-6.8 MG/ML ophthalmic solution,  .  EASY COMFORT INSULIN SYRINGE 30G X 1/2" 0.5 ML MISC,  .  gabapentin (NEURONTIN) 100 MG capsule, Take 1 cap with breakfast and lunch as needed for  foot pain; can take 1-2 caps with dinner or prior to bedtime; max total of 4 caps daily by mouth. .  hyoscyamine (LEVSIN SL) 0.125 MG SL tablet, Take 1 to 2 tablets 3 to 4 x day if needed for Nausea, vomiting, cramping or diarrhea .  lactulose (CHRONULAC) 10 GM/15ML solution, Take 15 ml (3 tsp) once or twice daily as needed for constipation. Marland Kitchen  latanoprost (XALATAN) 0.005 % ophthalmic solution,  .  LOTEMAX 0.5 % GEL,  .  Magnesium 400 MG TABS, Take 1 tablet by mouth daily. .  Multiple Vitamins-Minerals (HAIR/SKIN/NAILS) TABS, Take by mouth 2 (two) times daily. Marland Kitchen  rOPINIRole (REQUIP) 3 MG tablet, TAKE 2 TABLETS BY MOUTH AT BEDTIME FOR RESTLESSNESS .  senna-docusate (SENOKOT-S) 8.6-50 MG tablet, Take 1 tablet by mouth daily. .  vitamin C (ASCORBIC ACID) 500 MG tablet, Take 500 mg by mouth daily.  Patient Active Problem List   Diagnosis Date Noted  . COVID-19 04/16/2019  . Dysthymia 12/20/2018  . Type 2 diabetes mellitus with complication, with long-term current use of insulin ([place]) 02/09/2017  . Atherosclerosis of aorta ([place]) 11/06/2016  . Diabetic retinopathy ([place]) 05/18/2015  . Encounter for Medicare annual wellness exam 05/18/2015  . Medication management 08/06/2013  . Neuropathy due to type 2 diabetes mellitus ([place]) 06/04/2013  . CKD stage 3 due to type 2 diabetes mellitus ([place])   . Hyperlipidemia associated with type 2 diabetes mellitus ([place])   . Vitamin D deficiency   . Hypertension 07/29/2012  . Chronic pain 07/29/2012    Screening Tests  Immunization History  Administered Date(s) Administered  . DT (Pediatric) 01/04/2011  . Influenza Split 01/23/2013  . Influenza, High Dose Seasonal PF 02/27/2014, 03/11/2015, 12/21/2015, 01/30/2017, 02/08/2018, 01/02/2019  . Pneumococcal Conjugate-13 02/27/2014  . Pneumococcal Polysaccharide-23 01/06/2012   Preventative care: Last colonoscopy: 2006 will not get another due to age [facility] 10/2018 CXR 11/2015 DEXA 2007 patient does want Echo  2014  Lumbar 2018 MRA 2014  Tetanus 2012 Flu 2019 Pneumo 2013 Prevnar 13 2015 Shingles vaccine declines  Names of Other Physician/Practitioners you currently use: 1. [facility] here for primary care 2. Dr's [facility], eye doctor, last visit in 02/12/2018 report abstracted  3. Dr Sissy Hoff, DDS, dentist, last visit has scheduled 01/2019  Patient Care Team: [name], MD as PCP - General (Internal Medicine) Warden Fillers, MD as Consulting Physician (Optometry) Zadie Rhine Clent Demark, MD as Consulting Physician (Ophthalmology) Glenna Fellows, MD as Attending Physician (Neurosurgery) Melrose Nakayama, MD as Consulting Physician (Orthopedic Surgery) Milus Banister, MD as Attending Physician (Gastroenterology)   Allergies Allergies  Allergen Reactions  . Morphine And Related Other (See Comments)    fuzzy feeling    SURGICAL HISTORY She  has a  past surgical history that includes Abdominal hysterectomy; Cesarean section; Back surgery (1993); Appendectomy; Colonoscopy; Eye surgery; Trigger finger release (01/10/2012); and Breast excisional biopsy (Right, 1967). FAMILY HISTORY Her family history includes Diabetes in her sister; Liver disease in her sister. SOCIAL HISTORY She  reports that she has never smoked. She has never used smokeless tobacco. She reports that she does not drink alcohol or use drugs.  MEDICARE WELLNESS OBJECTIVES: Physical activity:   Cardiac risk factors:   Depression/mood screen:   Depression screen [facility] 2/9 12/20/2018  Decreased Interest 1  Down, Depressed, Hopeless 1  PHQ - 2 Score 2  Altered sleeping 0  Tired, decreased energy 2  Change in appetite 0  Feeling bad or failure about yourself  0  Trouble concentrating 1  Moving slowly or fidgety/restless 0  Suicidal thoughts 0  PHQ-9 Score 5  Difficult doing work/chores Somewhat difficult    ADLs:  In your present state of health, do you have any  difficulty performing the following activities: 02/02/2019 12/20/2018  Hearing? N N  Vision? N N  Difficulty concentrating or making decisions? N N  Comment - -  Walking or climbing stairs? N N  Dressing or bathing? N N  Doing errands, shopping? N N  Preparing Food and eating ? - N  Using the Toilet? - N  In the past six months, have you accidently leaked urine? - N  Do you have problems with loss of bowel control? - N  Managing your Medications? - N  Managing your Finances? - N  Housekeeping or managing your Housekeeping? - N  Some recent data might be hidden     Cognitive Testing  Alert? Yes  Normal Appearance?Yes  Oriented to person? Yes  Place? Yes   Time? Yes  Recall of three objects?  1/3   Can perform simple calculations? Yes  Displays appropriate judgment?Yes  Can read the correct time from a watch face?Yes  EOL planning:     Objective:     There were no vitals taken for this visit.   General Appearance: Well nourished, alert, WD/WN, female and in no apparent distress. Eyes: PERRLA, EOMs, conjunctiva no swelling or erythema, normal fundi and vessels. Sinuses: No frontal/maxillary tenderness ENT/Mouth: EACs patent / TMs  nl. Nares clear without erythema, swelling, mucoid exudates. Oral hygiene is good. No erythema, swelling, or exudate. Tongue normal, non-obstructing. Tonsils not swollen or erythematous. Hearing normal.  Neck: Supple, thyroid normal. No bruits, nodes or JVD. Respiratory: Respiratory effort normal.  BS equal and clear bilateral without rales, rhonci, wheezing or stridor. Cardio: Heart sounds are normal with regular rate and rhythm and no murmurs, rubs or gallops. Peripheral pulses are normal and equal bilaterally without edema. No aortic or femoral bruits. Chest: symmetric with normal excursions and percussion. Abdomen: Flat, soft  with nl bowel sounds. Nontender, no guarding, rebound, hernias, masses, or organomegaly.  Lymphatics: Non tender without  lymphadenopathy.  Musculoskeletal: Full ROM all peripheral extremities, joint stability, 5/5 strength, and normal gait. Skin: right flank with dry skin, excoriations, fine papules, no vesicles.  Neuro: Cranial nerves intact. DTR's UE  Nl/Equal and DTR's LE's absent.  Normal muscle tone, no cerebellar symptoms. Sensation decreased bilateral feet. Pysch: Alert and oriented X 3, normal affect, Insight and Judgment appropriate.    Medicare Attestation I have personally reviewed: The patient's medical and social history Their use of alcohol, tobacco or illicit drugs Their current medications and supplements The patient's functional ability including ADLs,fall risks, home safety risks,  cognitive, and hearing and visual impairment Diet and physical activities Evidence for depression or mood disorders  The patient's weight, height, BMI, and visual acuity have been recorded in the chart.  I have made referrals, counseling, and provided education to the patient based on review of the above and I have provided the patient with a written personalized care plan for preventive services.  Over 40 minutes of exam, counseling, chart review was performed.  Vicie Mutters, PA-C   06/10/2019

## 2019-06-11 ENCOUNTER — Ambulatory Visit: Payer: Medicare Other | Admitting: Physician Assistant

## 2019-06-13 ENCOUNTER — Other Ambulatory Visit: Payer: Self-pay | Admitting: Internal Medicine

## 2019-06-13 DIAGNOSIS — E782 Mixed hyperlipidemia: Secondary | ICD-10-CM

## 2019-06-14 ENCOUNTER — Other Ambulatory Visit: Payer: Self-pay

## 2019-06-14 DIAGNOSIS — E1140 Type 2 diabetes mellitus with diabetic neuropathy, unspecified: Secondary | ICD-10-CM

## 2019-06-14 MED ORDER — GABAPENTIN 100 MG PO CAPS: ORAL_CAPSULE | ORAL | 1 refills | Status: DC

## 2019-06-19 DIAGNOSIS — M79672 Pain in left foot: Secondary | ICD-10-CM | POA: Diagnosis not present

## 2019-06-20 NOTE — Progress Notes (Signed)
Diabetes Education and Follow-Up Visit  86 y.o.female presents for diabetic education. She has Diabetes Mellitus type 2:  with diabetic chronic kidney disease and with diabetic neuropathy, unspecified, she is on bASA, and denies foot ulcerations, hyperglycemia, hypoglycemia , increased appetite, nausea, polydipsia, polyuria, visual disturbances, vomiting and weight loss.  Last hemoglobin A1c was: Lab Results  Component Value Date   HGBA1C 10.5 (H) 05/13/2019   HGBA1C 7.9 (H) 01/30/2019   HGBA1C 8.9 (H) 11/19/2018    Body mass index is 21.27 kg/m.  Pt is on a regimen of: Novolin 70/30 pen 18 units in AM and 18-20 units in the evening   Pt checks her sugars 3 x day (fasting, before lunch, before dinner) - reports overall "running a bit high" except reports fasting seem "under control." She attributes elevations to her husband cooking more "he cooks what he likes to eat."   She unfortunately admits hasn't been keeping a good log, has papers, just "haven't been doing so good logging" typically is encouraged to keep a food and glucose log to each visit  Lowest sugar she can recall was 124.  She denies any episodes of hypoglycemia.  Highest sugar was 320 (checked right after eating).  Glucometer: Unsure of type, New meter, doesn't like, difficult to use, small strips, will reach out to insurance to request previous meter  SHE DID NOT BRING GLUCOSE LOG TODAY   AM glucose AM Insulin PM glucose PM insulin                                              Diet: whatever husband cooks, trying to limit potatoes and starches, no rice at all, tries to limit sweets, eats [employer] if has a sandwhich.   Exercise: minimal amount of walking right not due to weather   Patient does have CKD She is not on ACE/ARB - formerly on losartan, was d/c'd in 2017, unclear why, patient can't recall   Lab Results  Component Value Date   GFRNONAA 34 (L) 05/13/2019   Lab Results  Component Value  Date   CREATININE 1.39 (H) 05/13/2019   BUN 27 (H) 05/13/2019   NA 138 05/13/2019   K 5.0 05/13/2019   CL 99 05/13/2019   CO2 32 05/13/2019   Lab Results  Component Value Date   MICROALBUR 1.0 07/26/2018     She is on a Statin, taking rosuvastatin 20 mg daily  She is not at goal of less than 70.  Lab Results  Component Value Date   CHOL 160 05/13/2019   HDL 36 (L) 05/13/2019   LDLCALC 87 05/13/2019   TRIG 279 (H) 05/13/2019   CHOLHDL 4.4 05/13/2019    Diabetic eye exam - reports had with Dr. Katy Fitch Summer 2020; will request report Follows with Dr. Zadie Rhine q8 weeks for macular degen   Problem List has Hypertension; Chronic pain; CKD stage 3 due to type 2 diabetes mellitus ([place]); Hyperlipidemia associated with type 2 diabetes mellitus ([place]); Vitamin D deficiency; Neuropathy due to type 2 diabetes mellitus ([place]); Medication management; Diabetic retinopathy ([place]); Encounter for Medicare annual wellness exam; Atherosclerosis of aorta ([place]); Type 2 diabetes mellitus with complication, with long-term current use of insulin ([place]); Depression, major, recurrent, in partial remission ([place]); History of COVID-19; and Long term (current) use of insulin ([place]) on their problem list.  Medications Current Outpatient Medications  on File Prior to Visit  Medication Sig  . ACCU-CHEK AVIVA PLUS test strip Test 3 times a day  . aspirin 81 MG tablet Take 81 mg by mouth daily.  . ASSURE COMFORT LANCETS 30G MISC Check blood sugar 3 times daily-DX-E11.22  . bisoprolol-hydrochlorothiazide (ZIAC) 5-6.25 MG tablet Take 1/2 to 1 Tablet daily as directed for BP  . Blood Glucose Calibration (ACCU-CHEK AVIVA) SOLN Check blood sugar 3 times daily-DX-E11.22  . blood glucose meter kit and supplies KIT Test blood sugar three times a day for Diabetes. Dx:E11.8/ Z79.4 .  Marland Kitchen buPROPion (WELLBUTRIN XL) 150 MG 24 hr tablet Take 1 tablet every Morning for Mood , Focus & Concentration  . Cholecalciferol (VITAMIN D3) 2000  UNITS TABS Take 1 capsule by mouth 2 (two) times daily.  . COMFORT EZ PEN NEEDLES 32G X 4 MM MISC USE 2 TIMES DAILY AS DIRECTED , 1 PEN NEEDLE PER INJECTION.  Marland Kitchen Continuous Blood Gluc Sensor MISC Check blood sugar 3 times a day- Dx: E11.8/Z79.4  . dorzolamide-timolol (COSOPT) 22.3-6.8 MG/ML ophthalmic solution   . EASY COMFORT INSULIN SYRINGE 30G X 1/2" 0.5 ML MISC   . gabapentin (NEURONTIN) 100 MG capsule Take 1 cap with breakfast and lunch as needed for foot pain; can take 1-2 caps with dinner or prior to bedtime; max total of 4 caps daily by mouth.  . hyoscyamine (LEVSIN SL) 0.125 MG SL tablet Take 1 to 2 tablets 3 to 4 x day if needed for Nausea, vomiting, cramping or diarrhea  . Insulin Isophane & Regular Human (NOVOLIN 70/30 FLEXPEN RELION) (70-30) 100 UNIT/ML PEN Inject 15-20 units subcutaneously with breakfast and dinner as directed for diabetes/high blood sugar.  . lactulose (CHRONULAC) 10 GM/15ML solution Take 15 ml (3 tsp) once or twice daily as needed for constipation.  Marland Kitchen latanoprost (XALATAN) 0.005 % ophthalmic solution   . LOTEMAX 0.5 % GEL   . Magnesium 400 MG TABS Take 1 tablet by mouth daily.  . Multiple Vitamins-Minerals (HAIR/SKIN/NAILS) TABS Take by mouth 2 (two) times daily.  Marland Kitchen rOPINIRole (REQUIP) 3 MG tablet TAKE 2 TABLETS BY MOUTH AT BEDTIME FOR RESTLESSNESS  . rosuvastatin (CRESTOR) 20 MG tablet Take 1 tablet Daily for Cholesterol  . senna-docusate (SENOKOT-S) 8.6-50 MG tablet Take 1 tablet by mouth daily.  . vitamin B-12 (CYANOCOBALAMIN) 1000 MCG tablet Take 1,000 mcg by mouth daily.  . vitamin C (ASCORBIC ACID) 500 MG tablet Take 500 mg by mouth daily.   No current facility-administered medications on file prior to visit.    ROS- see HPI  Physical Exam: Blood pressure 132/66, pulse 63, temperature (!) 97.5 F (36.4 C), height 5' 3.5" (1.613 m), weight 122 lb (55.3 kg), SpO2 96 %. Body mass index is 21.27 kg/m. General Appearance: Well nourished, in no apparent  distress. Eyes: PERRLA, EOMs, conjunctiva no swelling or erythema ENT/Mouth: Ext aud canals clear, TMs without erythema, bulging. No erythema, swelling, or exudate on post pharynx.  Tonsils not swollen or erythematous. Hearing normal.  Respiratory: Respiratory effort normal, BS equal bilaterally without rales, rhonchi, wheezing or stridor.  Cardio: RRR with no MRGs. Brisk peripheral pulses without edema.  Abdomen: Soft, + BS.  Non tender, no guarding, rebound, hernias, masses. Musculoskeletal: Full ROM, 5/5 strength, normal gait.  Skin: Warm, dry without rashes, lesions, ecchymosis.  Neuro: Cranial nerves intact. Normal muscle tone, no cerebellar symptoms. Sensation intact.    Plan and Assessment:  Diabetes Education: Reviewed 'ABCs' of diabetes management (respective goals in parentheses):  A1C (<  7), blood pressure (<130/80), and cholesterol (LDL <70) Discussed benefit of ACE/AEB; formerly on and d/c'd, unclear why; she declines addition today Eye Exam yearly and Dental Exam every 6 months.- report requested Dietary recommendations Physical Activity recommendations - Continue with TID glucose - given sugar log and advised how to fill it and to bring it at next appt, will follow up sooner if fructosamine not significantly improved from last A1C  - compliance with diet and logging emphasized - reviewed foot care principles  - given instructions for hypoglycemia management   - check fructosamine, CMP/GFR  Kelsey Ribas, NP 10:03 AM [facility]   Future Appointments  Date Time Provider [place]  06/24/2019 10:30 AM Liane Comber, NP [facility] None  08/30/2019 10:00 AM [name], MD [facility] None  12/23/2019  2:00 PM Liane Comber, NP [facility] None

## 2019-06-24 ENCOUNTER — Other Ambulatory Visit: Payer: Self-pay

## 2019-06-24 ENCOUNTER — Encounter: Payer: Self-pay | Admitting: Adult Health

## 2019-06-24 ENCOUNTER — Ambulatory Visit: Payer: Medicare Other | Admitting: Adult Health

## 2019-06-24 ENCOUNTER — Ambulatory Visit (INDEPENDENT_AMBULATORY_CARE_PROVIDER_SITE_OTHER): Payer: Medicare Other | Admitting: Adult Health

## 2019-06-24 VITALS — BP 132/66 | HR 63 | Temp 97.5°F | Ht 63.5 in | Wt 122.0 lb

## 2019-06-24 DIAGNOSIS — I10 Essential (primary) hypertension: Secondary | ICD-10-CM

## 2019-06-24 DIAGNOSIS — E11319 Type 2 diabetes mellitus with unspecified diabetic retinopathy without macular edema: Secondary | ICD-10-CM | POA: Diagnosis not present

## 2019-06-24 DIAGNOSIS — E1169 Type 2 diabetes mellitus with other specified complication: Secondary | ICD-10-CM

## 2019-06-24 DIAGNOSIS — Z794 Long term (current) use of insulin: Secondary | ICD-10-CM

## 2019-06-24 DIAGNOSIS — E1122 Type 2 diabetes mellitus with diabetic chronic kidney disease: Secondary | ICD-10-CM

## 2019-06-24 DIAGNOSIS — N183 Chronic kidney disease, stage 3 unspecified: Secondary | ICD-10-CM

## 2019-06-24 DIAGNOSIS — E785 Hyperlipidemia, unspecified: Secondary | ICD-10-CM

## 2019-06-24 DIAGNOSIS — E118 Type 2 diabetes mellitus with unspecified complications: Secondary | ICD-10-CM

## 2019-06-24 DIAGNOSIS — E1140 Type 2 diabetes mellitus with diabetic neuropathy, unspecified: Secondary | ICD-10-CM

## 2019-06-24 NOTE — Patient Instructions (Addendum)
  Goals    . HEMOGLOBIN A1C < 8.0     Continue to keep track of your food and insulin Suggest checking your sugar BEFORE breakfast, luch & dinner and bring that in with you Continue to check your sugars regularly and  BRING IN YOUR LOG    Insulin regimen (novolin 70/30) Breakfast: 18 units with breakfast unless blood sugar low, then skip and check sugar before lunch, if 180+ take 20 units  Dinner:  take 15 if blood sugar low or eating a light dinner Take 18 units if blood sugar normal/high or having large dinner  Continue calling with reports of glucose if unusually low or high  STOP processed biscuits, sweets, white rice, crackers, fruit juice, cereal  A LOW SUGAR IS MORE DANGEROUS THAN A HIGH SUGAR    . LDL CALC < 70     Take rosuvastatin daily in the evening for cholesterol         PLEASE do better keeping a better food and sugar/insulin log  Bring to every appointment       Bad carbs also include fruit juice, alcohol, and sweet tea. These are empty calories that do not signal to your brain that you are full.   Please remember the good carbs are still carbs which convert into sugar. So please measure them out no more than 1/2-1 cup of rice, oatmeal, pasta, and beans  Veggies are however free foods! Pile them on.   Not all fruit is created equal. Please see the list below, the fruit at the bottom is higher in sugars than the fruit at the top. Please avoid all dried fruits.

## 2019-06-25 ENCOUNTER — Encounter: Payer: Self-pay | Admitting: Internal Medicine

## 2019-06-25 ENCOUNTER — Other Ambulatory Visit: Payer: Self-pay

## 2019-06-25 DIAGNOSIS — E118 Type 2 diabetes mellitus with unspecified complications: Secondary | ICD-10-CM

## 2019-06-25 DIAGNOSIS — Z794 Long term (current) use of insulin: Secondary | ICD-10-CM

## 2019-06-25 LAB — COMPLETE METABOLIC PANEL WITH GFR
AG Ratio: 1.3 (calc) (ref 1.0–2.5)
ALT: 33 U/L — ABNORMAL HIGH (ref 6–29)
AST: 28 U/L (ref 10–35)
Albumin: 3.9 g/dL (ref 3.6–5.1)
Alkaline phosphatase (APISO): 86 U/L (ref 37–153)
BUN/Creatinine Ratio: 18 (calc) (ref 6–22)
BUN: 26 mg/dL — ABNORMAL HIGH (ref 7–25)
CO2: 29 mmol/L (ref 20–32)
Calcium: 9.9 mg/dL (ref 8.6–10.4)
Chloride: 97 mmol/L — ABNORMAL LOW (ref 98–110)
Creat: 1.46 mg/dL — ABNORMAL HIGH (ref 0.60–0.88)
GFR, Est African American: 37 mL/min/{1.73_m2} — ABNORMAL LOW (ref >=60)
GFR, Est Non African American: 32 mL/min/{1.73_m2} — ABNORMAL LOW (ref >=60)
Globulin: 3 g/dL (calc) (ref 1.9–3.7)
Glucose, Bld: 466 mg/dL — ABNORMAL HIGH (ref 65–99)
Potassium: 5 mmol/L (ref 3.5–5.3)
Sodium: 135 mmol/L (ref 135–146)
Total Bilirubin: 0.4 mg/dL (ref 0.2–1.2)
Total Protein: 6.9 g/dL (ref 6.1–8.1)

## 2019-06-25 MED ORDER — BLOOD GLUCOSE MONITOR KIT: PACK | 0 refills | Status: DC

## 2019-07-08 NOTE — Progress Notes (Deleted)
Diabetes Education and Follow-Up Visit  86 y.o.female presents for diabetic education. She has Diabetes Mellitus type 2:  with diabetic chronic kidney disease and with diabetic neuropathy, unspecified, she is on bASA, and denies foot ulcerations, hyperglycemia, hypoglycemia , increased appetite, nausea, polydipsia, polyuria, visual disturbances, vomiting and weight loss.  Close follow up today due to recent poor glucose control, did not bring glucose log as last visit, serum glucose at that visit was 466 -   Last hemoglobin A1c was: Lab Results  Component Value Date   HGBA1C 10.5 (H) 05/13/2019   HGBA1C 7.9 (H) 01/30/2019   HGBA1C 8.9 (H) 11/19/2018    There is no height or weight on file to calculate BMI.  Pt is on a regimen of: Novolin 70/30 pen 18 units in AM and 18-20 units in the evening   Pt checks her sugars 3 x day (fasting, before lunch, before dinner) - reports overall "running a bit high" except reports fasting seem "under control." She attributes elevations to her husband cooking more "he cooks what he likes to eat."   She unfortunately admits hasn't been keeping a good log, has papers, just "haven't been doing so good logging" typically is encouraged to keep a food and glucose log to each visit  Lowest sugar she can recall was 124.  She denies any episodes of hypoglycemia.  Highest sugar was 320 (checked right after eating).  Glucometer: Unsure of type, New meter, doesn't like, difficult to use, small strips, will reach out to insurance to request previous meter  SHE DID NOT BRING GLUCOSE LOG TODAY   AM glucose AM Insulin PM glucose PM insulin                                              Diet: whatever husband cooks, trying to limit potatoes and starches, no rice at all, tries to limit sweets, eats [employer] if has a sandwhich.   Exercise: minimal amount of walking right not due to weather   Patient does have CKD She is not on ACE/ARB - formerly on  losartan, was d/c'd in 2017, unclear why, patient can't recall   Lab Results  Component Value Date   GFRNONAA 32 (L) 06/24/2019   Lab Results  Component Value Date   CREATININE 1.46 (H) 06/24/2019   BUN 26 (H) 06/24/2019   NA 135 06/24/2019   K 5.0 06/24/2019   CL 97 (L) 06/24/2019   CO2 29 06/24/2019   Lab Results  Component Value Date   MICROALBUR 1.0 07/26/2018     She is on a Statin, taking rosuvastatin 20 mg daily  She is not at goal of less than 70.  Lab Results  Component Value Date   CHOL 160 05/13/2019   HDL 36 (L) 05/13/2019   LDLCALC 87 05/13/2019   TRIG 279 (H) 05/13/2019   CHOLHDL 4.4 05/13/2019    Diabetic eye exam - reports had with Dr. Groat Summer 2020; will request report Follows with Dr. Rankin q8 weeks for macular degen   Problem List has Hypertension; Chronic pain; CKD stage 3 due to type 2 diabetes mellitus (HCC); Hyperlipidemia associated with type 2 diabetes mellitus (HCC); Vitamin D deficiency; Neuropathy due to type 2 diabetes mellitus (HCC); Medication management; Diabetic retinopathy (HCC); Encounter for Medicare annual wellness exam; Atherosclerosis of aorta (HCC); Type 2 diabetes mellitus with complication,   with long-term current use of insulin (HCC); Depression, major, recurrent, in partial remission (HCC); History of COVID-19; and Long term (current) use of insulin (HCC) on their problem list.  Medications Current Outpatient Medications on File Prior to Visit  Medication Sig  . ACCU-CHEK AVIVA PLUS test strip Test 3 times a day  . aspirin 81 MG tablet Take 81 mg by mouth daily.  . ASSURE COMFORT LANCETS 30G MISC Check blood sugar 3 times daily-DX-E11.22  . bisoprolol-hydrochlorothiazide (ZIAC) 5-6.25 MG tablet Take 1/2 to 1 Tablet daily as directed for BP  . Blood Glucose Calibration (ACCU-CHEK AVIVA) SOLN Check blood sugar 3 times daily-DX-E11.22  . blood glucose meter kit and supplies KIT Test blood sugar three times a day for Diabetes.  Dx:E11.8/ Z79.4 .  . blood glucose meter kit and supplies KIT Dispense based on patient and insurance preference. Use up to three times daily as directed. Dx:  . buPROPion (WELLBUTRIN XL) 150 MG 24 hr tablet Take 1 tablet every Morning for Mood , Focus & Concentration  . Cholecalciferol (VITAMIN D3) 2000 UNITS TABS Take 1 capsule by mouth 2 (two) times daily.  . COMFORT EZ PEN NEEDLES 32G X 4 MM MISC USE 2 TIMES DAILY AS DIRECTED , 1 PEN NEEDLE PER INJECTION.  . Continuous Blood Gluc Sensor MISC Check blood sugar 3 times a day- Dx: E11.8/Z79.4  . dorzolamide-timolol (COSOPT) 22.3-6.8 MG/ML ophthalmic solution   . EASY COMFORT INSULIN SYRINGE 30G X 1/2" 0.5 ML MISC   . gabapentin (NEURONTIN) 100 MG capsule Take 1 cap with breakfast and lunch as needed for foot pain; can take 1-2 caps with dinner or prior to bedtime; max total of 4 caps daily by mouth.  . hyoscyamine (LEVSIN SL) 0.125 MG SL tablet Take 1 to 2 tablets 3 to 4 x day if needed for Nausea, vomiting, cramping or diarrhea  . Insulin Isophane & Regular Human (NOVOLIN 70/30 FLEXPEN RELION) (70-30) 100 UNIT/ML PEN Inject 15-20 units subcutaneously with breakfast and dinner as directed for diabetes/high blood sugar.  . lactulose (CHRONULAC) 10 GM/15ML solution Take 15 ml (3 tsp) once or twice daily as needed for constipation.  . latanoprost (XALATAN) 0.005 % ophthalmic solution   . LOTEMAX 0.5 % GEL   . Magnesium 400 MG TABS Take 1 tablet by mouth daily.  . Multiple Vitamins-Minerals (HAIR/SKIN/NAILS) TABS Take by mouth 2 (two) times daily.  . rOPINIRole (REQUIP) 3 MG tablet TAKE 2 TABLETS BY MOUTH AT BEDTIME FOR RESTLESSNESS  . rosuvastatin (CRESTOR) 20 MG tablet Take 1 tablet Daily for Cholesterol  . senna-docusate (SENOKOT-S) 8.6-50 MG tablet Take 1 tablet by mouth daily.  . vitamin B-12 (CYANOCOBALAMIN) 1000 MCG tablet Take 1,000 mcg by mouth daily.  . vitamin C (ASCORBIC ACID) 500 MG tablet Take 500 mg by mouth daily.   No current  facility-administered medications on file prior to visit.    ROS- see HPI  Physical Exam: There were no vitals taken for this visit. There is no height or weight on file to calculate BMI. General Appearance: Well nourished, in no apparent distress. Eyes: PERRLA, EOMs, conjunctiva no swelling or erythema ENT/Mouth: Ext aud canals clear, TMs without erythema, bulging. No erythema, swelling, or exudate on post pharynx.  Tonsils not swollen or erythematous. Hearing normal.  Respiratory: Respiratory effort normal, BS equal bilaterally without rales, rhonchi, wheezing or stridor.  Cardio: RRR with no MRGs. Brisk peripheral pulses without edema.  Abdomen: Soft, + BS.  Non tender, no guarding, rebound, hernias, masses.   Musculoskeletal: Full ROM, 5/5 strength, normal gait.  Skin: Warm, dry without rashes, lesions, ecchymosis.  Neuro: Cranial nerves intact. Normal muscle tone, no cerebellar symptoms. Sensation intact.    Plan and Assessment:  Diabetes Education: Reviewed 'ABCs' of diabetes management (respective goals in parentheses):  A1C (<7), blood pressure (<130/80), and cholesterol (LDL <70) Discussed benefit of ACE/AEB; formerly on and d/c'd, unclear why; she declines addition today Eye Exam yearly and Dental Exam every 6 months.- report requested Dietary recommendations Physical Activity recommendations - Continue with TID glucose - given sugar log and advised how to fill it and to bring it at next appt, will follow up sooner if fructosamine not significantly improved from last A1C  - compliance with diet and logging emphasized - reviewed foot care principles  - given instructions for hypoglycemia management   - check fructosamine, CMP/GFR  Ashley C Corbett, NP 8:15 AM [facility]   Future Appointments  Date Time Provider Department Center  07/10/2019 10:30 AM Corbett, Ashley, NP [facility] None  08/30/2019 10:00 AM McKeown, William, MD  [facility] None  12/23/2019  2:00 PM Corbett, Ashley, NP [facility] None   

## 2019-07-10 ENCOUNTER — Ambulatory Visit: Payer: Medicare Other | Admitting: Adult Health

## 2019-07-22 ENCOUNTER — Ambulatory Visit: Payer: Medicare Other | Attending: Internal Medicine

## 2019-07-22 DIAGNOSIS — Z23 Encounter for immunization: Secondary | ICD-10-CM

## 2019-07-22 NOTE — Progress Notes (Signed)
   Covid-19 Vaccination Clinic  Name:  [name]    MRN: [id number] DOB: 1929-06-20  07/22/2019  [name] was observed post Covid-19 immunization for 15 minutes without incident. She was provided with Vaccine Information Sheet and instruction to access the V-Safe system.   [name] was instructed to call 911 with any severe reactions post vaccine: Marland Kitchen Difficulty breathing  . Swelling of face and throat  . A fast heartbeat  . A bad rash all over body  . Dizziness and weakness   Immunizations Administered    Name Date Dose VIS Date Route   Pfizer COVID-19 Vaccine 07/22/2019  2:26 PM 0.3 mL 04/05/2019 Intramuscular   Manufacturer: [place]   Lot: [id number]   [place]: [address]-7

## 2019-08-13 ENCOUNTER — Other Ambulatory Visit: Payer: Self-pay | Admitting: Internal Medicine

## 2019-08-13 ENCOUNTER — Ambulatory Visit: Payer: Medicare Other | Attending: Internal Medicine

## 2019-08-13 ENCOUNTER — Other Ambulatory Visit: Payer: Self-pay | Admitting: Orthopaedic Surgery

## 2019-08-13 DIAGNOSIS — G2581 Restless legs syndrome: Secondary | ICD-10-CM

## 2019-08-13 DIAGNOSIS — I10 Essential (primary) hypertension: Secondary | ICD-10-CM

## 2019-08-13 DIAGNOSIS — Z23 Encounter for immunization: Secondary | ICD-10-CM

## 2019-08-13 DIAGNOSIS — M47816 Spondylosis without myelopathy or radiculopathy, lumbar region: Secondary | ICD-10-CM

## 2019-08-13 DIAGNOSIS — E1140 Type 2 diabetes mellitus with diabetic neuropathy, unspecified: Secondary | ICD-10-CM

## 2019-08-13 NOTE — Progress Notes (Signed)
   Covid-19 Vaccination Clinic  Name:  [name]    MRN: [id number] DOB: [birth year]  08/13/2019  [name] was observed post Covid-19 immunization for 15 minutes without incident. She was provided with Vaccine Information Sheet and instruction to access the V-Safe system.   [name] was instructed to call 911 with any severe reactions post vaccine: Marland Kitchen Difficulty breathing  . Swelling of face and throat  . A fast heartbeat  . A bad rash all over body  . Dizziness and weakness   Immunizations Administered    Name Date Dose VIS Date Route   Pfizer COVID-19 Vaccine 08/13/2019  3:04 PM 0.3 mL 06/19/2018 Intramuscular   Manufacturer: [place]   Lot: [id number]   [place]: [id number]

## 2019-08-16 ENCOUNTER — Other Ambulatory Visit: Payer: Self-pay | Admitting: Adult Health Nurse Practitioner

## 2019-08-19 ENCOUNTER — Ambulatory Visit (INDEPENDENT_AMBULATORY_CARE_PROVIDER_SITE_OTHER): Payer: Medicare Other | Admitting: Ophthalmology

## 2019-08-19 ENCOUNTER — Encounter (INDEPENDENT_AMBULATORY_CARE_PROVIDER_SITE_OTHER): Payer: Self-pay | Admitting: Ophthalmology

## 2019-08-19 ENCOUNTER — Other Ambulatory Visit: Payer: Self-pay

## 2019-08-19 DIAGNOSIS — H353222 Exudative age-related macular degeneration, left eye, with inactive choroidal neovascularization: Secondary | ICD-10-CM | POA: Diagnosis not present

## 2019-08-19 DIAGNOSIS — H353221 Exudative age-related macular degeneration, left eye, with active choroidal neovascularization: Secondary | ICD-10-CM | POA: Insufficient documentation

## 2019-08-19 DIAGNOSIS — H353220 Exudative age-related macular degeneration, left eye, stage unspecified: Secondary | ICD-10-CM | POA: Insufficient documentation

## 2019-08-19 DIAGNOSIS — H35351 Cystoid macular degeneration, right eye: Secondary | ICD-10-CM | POA: Diagnosis not present

## 2019-08-19 DIAGNOSIS — H353211 Exudative age-related macular degeneration, right eye, with active choroidal neovascularization: Secondary | ICD-10-CM | POA: Diagnosis not present

## 2019-08-19 DIAGNOSIS — H353212 Exudative age-related macular degeneration, right eye, with inactive choroidal neovascularization: Secondary | ICD-10-CM | POA: Insufficient documentation

## 2019-08-19 HISTORY — DX: Cystoid macular degeneration, right eye: H35.351

## 2019-08-19 MED ORDER — BEVACIZUMAB CHEMO INJECTION 1.25MG/0.05ML SYRINGE FOR KALEIDOSCOPE
1.2500 mg | INTRAVITREAL | Status: AC | PRN
  Administered 2019-08-19: 1.25 mg via INTRAVITREAL

## 2019-08-19 NOTE — Progress Notes (Signed)
08/19/2019     CHIEF COMPLAINT Patient presents for Retina Follow Up   HISTORY OF PRESENT ILLNESS: [name] is a 86 y.o. female who presents to the clinic today for:   HPI    Retina Follow Up    Patient presents with  Wet AMD.  In right eye.  Severity is moderate.  Since onset it is stable.  I, the attending physician,  performed the HPI with the patient and updated documentation appropriately.          Comments    3 Month AMD f\u OU. Possible Avastin OD. OCT  Pt states vision has not changed. Pt still has trouble reading some small print. Using gtts as directed.       Last edited by Tilda Franco on 08/19/2019  9:12 AM. (History)      Referring physician: [name], MD [address] [place],  [place] [address]  HISTORICAL INFORMATION:   Selected notes from the MEDICAL RECORD NUMBER    Lab Results  Component Value Date   HGBA1C 10.5 (H) 05/13/2019     CURRENT MEDICATIONS: Current Outpatient Medications (Ophthalmic Drugs)  Medication Sig  . dorzolamide-timolol (COSOPT) 22.3-6.8 MG/ML ophthalmic solution   . latanoprost (XALATAN) 0.005 % ophthalmic solution   . LOTEMAX 0.5 % GEL    No current facility-administered medications for this visit. (Ophthalmic Drugs)   Current Outpatient Medications (Other)  Medication Sig  . aspirin 81 MG tablet Take 81 mg by mouth daily.  . ASSURE COMFORT LANCETS 30G MISC Check blood sugar 3 times daily-DX-E11.22  . bisoprolol-hydrochlorothiazide (ZIAC) 5-6.25 MG tablet Take 1 tablet Daily for BP  . Blood Glucose Calibration (ACCU-CHEK AVIVA) SOLN Check blood sugar 3 times daily-DX-E11.22  . blood glucose meter kit and supplies KIT Test blood sugar three times a day for Diabetes. Dx:E11.8/ Z79.4 .  Marland Kitchen blood glucose meter kit and supplies KIT Dispense based on patient and insurance preference. Use up to three times daily as directed. Dx:  . buPROPion (WELLBUTRIN XL) 150 MG 24 hr tablet Take 1 tablet  every Morning for Mood , Focus & Concentration  . Cholecalciferol (VITAMIN D3) 2000 UNITS TABS Take 1 capsule by mouth 2 (two) times daily.  . COMFORT EZ PEN NEEDLES 32G X 4 MM MISC USE 2 TIMES DAILY AS DIRECTED , 1 PEN NEEDLE PER INJECTION.  Marland Kitchen Continuous Blood Gluc Sensor MISC Check blood sugar 3 times a day- Dx: E11.8/Z79.4  . EASY COMFORT INSULIN SYRINGE 30G X 1/2" 0.5 ML MISC   . gabapentin (NEURONTIN) 100 MG capsule Take 1 cap with breakfast and lunch as needed for foot pain; can take 1-2 caps with dinner or prior to bedtime; max total of 4 caps daily by mouth.  . hyoscyamine (LEVSIN SL) 0.125 MG SL tablet Take 1 to 2 tablets 3 to 4 x day if needed for Nausea, vomiting, cramping or diarrhea  . Insulin Isophane & Regular Human (NOVOLIN 70/30 FLEXPEN RELION) (70-30) 100 UNIT/ML PEN Inject 15-20 units subcutaneously with breakfast and dinner as directed for diabetes/high blood sugar.  . lactulose (CHRONULAC) 10 GM/15ML solution Take 15 ml (3 tsp) once or twice daily as needed for constipation.  . Magnesium 400 MG TABS Take 1 tablet by mouth daily.  . Multiple Vitamins-Minerals (HAIR/SKIN/NAILS) TABS Take by mouth 2 (two) times daily.  Glory Rosebush VERIO test strip USE  STRIP TO CHECK GLUCOSE THREE TIMES DAILY AS DIRECTED  . rOPINIRole (REQUIP) 3 MG tablet Take  1 to 2 tablets at Bedtime as needed for Restless Legs  . rosuvastatin (CRESTOR) 20 MG tablet Take 1 tablet Daily for Cholesterol  . senna-docusate (SENOKOT-S) 8.6-50 MG tablet Take 1 tablet by mouth daily.  . vitamin B-12 (CYANOCOBALAMIN) 1000 MCG tablet Take 1,000 mcg by mouth daily.  . vitamin C (ASCORBIC ACID) 500 MG tablet Take 500 mg by mouth daily.   No current facility-administered medications for this visit. (Other)      REVIEW OF SYSTEMS:    ALLERGIES Allergies  Allergen Reactions  . Morphine And Related Other (See Comments)    fuzzy feeling    PAST MEDICAL HISTORY Past Medical History:  Diagnosis Date  . Arthritis    . Chronic back pain   . Diabetic neuropathy (HCC)   . Hypercholesteremia   . Hypertension   . Liver lesion   . Neuropathy   . Type II or unspecified type diabetes mellitus without mention of complication, not stated as uncontrolled   . Vitamin D deficiency   . Wears glasses    Past Surgical History:  Procedure Laterality Date  . ABDOMINAL HYSTERECTOMY    . APPENDECTOMY    . BACK SURGERY  1993   lumb lam-fusion  . BREAST EXCISIONAL BIOPSY Right 1967   benign  . CESAREAN SECTION     x 3   . COLONOSCOPY    . EYE SURGERY     both cataracts  . TRIGGER FINGER RELEASE  01/10/2012   Procedure: RELEASE TRIGGER FINGER/A-1 PULLEY;  Surgeon: Peter G Dalldorf, MD;  Location: [facility];  Service: Orthopedics;  Laterality: Right;  right ring trigger release    FAMILY HISTORY Family History  Problem Relation Age of Onset  . Liver disease Sister   . Diabetes Sister   . Colon cancer Neg Hx     SOCIAL HISTORY Social History   Tobacco Use  . Smoking status: Never Smoker  . Smokeless tobacco: Never Used  Substance Use Topics  . Alcohol use: No  . Drug use: No         OPHTHALMIC EXAM: Base Eye Exam    Visual Acuity (Snellen - Linear)      Right Left   Dist cc 20/200 20/40 -1   Dist ph cc NI NI   Correction: Glasses       Tonometry (Tonopen, 9:19 AM)      Right Left   Pressure 16 17       Pupils      Pupils Dark Light Shape React APD   Right PERRL 2 2 Round Minimal None   Left PERRL 2 2 Round Minimal None       Visual Fields (Counting fingers)      Left Right    Full Full       Neuro/Psych    Oriented x3: Yes   Mood/Affect: Normal       Dilation    Both eyes: 2.5% Phenylephrine @ 9:19 AM        Slit Lamp and Fundus Exam    External Exam      Right Left   External Normal Normal       Slit Lamp Exam      Right Left   Lids/Lashes Normal Normal   Conjunctiva/Sclera White and quiet White and quiet   Cornea Clear Clear   Anterior  Chamber Deep and quiet Deep and quiet   Iris Round and reactive Round and reactive   Lens Posterior chamber   intraocular lens Posterior chamber intraocular lens   Anterior Vitreous Normal Normal       Fundus Exam      Right Left   Posterior Vitreous Posterior vitreous detachment Posterior vitreous detachment   Disc Normal Normal   C/D Ratio 0.5 0.35   Macula Geographic atrophy, Advanced age related macular degeneration, no macular thickening, Retinal pigment epithelial mottling Geographic atrophy, Advanced age related macular degeneration, no macular thickening, Retinal pigment epithelial mottling   Vessels Normal Normal   Periphery Normal Normal          IMAGING AND PROCEDURES  Imaging and Procedures for 08/19/19           ASSESSMENT/PLAN:  No problem-specific Assessment & Plan notes found for this encounter.      ICD-10-CM   1. Exudative age-related macular degeneration of right eye with active choroidal neovascularization (HCC)  H35.3211 OCT, Retina - OU - Both Eyes  2. Cystoid macular edema of right eye  H35.351 OCT, Retina - OU - Both Eyes  3. 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)  H35.3222 OCT, Retina - OU - Both Eyes    1.  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   Will repeat Avastin OD today at 350-monthnterval  2.  3.  Ophthalmic Meds Ordered this visit:  No orders of the defined types were placed in this encounter.      No follow-ups on file.  There are no Patient Instructions on file for this visit.   Explained the diagnoses, plan, and follow up with the patient and they expressed understanding.  Patient expressed understanding of the importance of proper follow up care.   GaClent Demarkankin M.D. Diseases & Surgery of the Retina and Vitreous [facility]ina & Diabetic EyMackinac Island4/26/21     Abbreviations: M myopia  (nearsighted); A astigmatism; H hyperopia (farsighted); P presbyopia; Mrx spectacle prescription;  CTL contact lenses; OD right eye; OS left eye; OU both eyes  XT exotropia; ET esotropia; PEK punctate epithelial keratitis; PEE punctate epithelial erosions; DES dry eye syndrome; MGD meibomian gland dysfunction; ATs artificial tears; PFAT's preservative free artificial tears; NSGraysonuclear sclerotic cataract; PSC posterior subcapsular cataract; ERM epi-retinal membrane; PVD posterior vitreous detachment; RD retinal detachment; DM diabetes mellitus; DR diabetic retinopathy; NPDR non-proliferative diabetic retinopathy; PDR proliferative diabetic retinopathy; CSME clinically significant macular edema; DME diabetic macular edema; dbh dot blot hemorrhages; CWS cotton wool spot; POAG primary open angle glaucoma; C/D cup-to-disc ratio; HVF humphrey visual field; GVF goldmann visual field; OCT optical coherence tomography; IOP intraocular pressure; BRVO Branch retinal vein occlusion; CRVO central retinal vein occlusion; CRAO central retinal artery occlusion; BRAO branch retinal artery occlusion; RT retinal tear; SB scleral buckle; PPV pars plana vitrectomy; VH Vitreous hemorrhage; PRP panretinal laser photocoagulation; IVK intravitreal kenalog; VMT vitreomacular traction; MH Macular hole;  NVD neovascularization of the disc; NVE neovascularization elsewhere; AREDS age related eye disease study; ARMD age related macular degeneration; POAG primary open angle glaucoma; EBMD epithelial/anterior basement membrane dystrophy; ACIOL anterior chamber intraocular lens; IOL intraocular lens; PCIOL posterior chamber intraocular lens; Phaco/IOL phacoemulsification with intraocular lens placement; PRCanbyhotorefractive keratectomy; LASIK laser assisted in situ keratomileusis; HTN hypertension; DM diabetes mellitus; COPD chronic obstructive pulmonary disease

## 2019-08-20 ENCOUNTER — Ambulatory Visit
Admission: RE | Admit: 2019-08-20 | Discharge: 2019-08-20 | Disposition: A | Discharge status: Home / Self Care | Payer: Medicare Other | Source: Ambulatory Visit | Attending: Orthopaedic Surgery | Admitting: Orthopaedic Surgery

## 2019-08-20 DIAGNOSIS — M47816 Spondylosis without myelopathy or radiculopathy, lumbar region: Secondary | ICD-10-CM

## 2019-08-20 DIAGNOSIS — G8929 Other chronic pain: Secondary | ICD-10-CM | POA: Diagnosis not present

## 2019-08-20 DIAGNOSIS — M545 Low back pain: Secondary | ICD-10-CM | POA: Diagnosis not present

## 2019-08-20 MED ORDER — METHYLPREDNISOLONE ACETATE 40 MG/ML INJ SUSP (RADIOLOG
120.0000 mg | Freq: Once | INTRAMUSCULAR | Status: AC
  Administered 2019-08-20: 120 mg via EPIDURAL

## 2019-08-20 MED ORDER — IOPAMIDOL (ISOVUE-M 200) INJECTION 41%
1.0000 mL | Freq: Once | INTRAMUSCULAR | Status: AC
  Administered 2019-08-20: 1 mL via EPIDURAL

## 2019-08-20 NOTE — Discharge Instructions (Signed)

## 2019-08-21 ENCOUNTER — Telehealth: Payer: Self-pay | Admitting: *Deleted

## 2019-08-21 NOTE — Telephone Encounter (Signed)
Order for diabetic supplies faxed to [facility] at [phone].

## 2019-08-26 ENCOUNTER — Ambulatory Visit: Payer: Medicare Other | Admitting: Adult Health

## 2019-08-29 ENCOUNTER — Encounter: Payer: Self-pay | Admitting: Internal Medicine

## 2019-08-29 NOTE — Patient Instructions (Signed)

## 2019-08-29 NOTE — Progress Notes (Signed)
Annual Screening/Preventative Visit & Comprehensive Evaluation &  Examination     This very nice 86 y.o. MWF presents for a Screening /Preventative Visit & comprehensive evaluation and management of multiple medical co-morbidities.  Patient has been followed for HTN, HLD, T1_DM/CKD3b  and Vitamin D Deficiency.      HTN predates since  1996. Patient had a neg Cardiolyte in 2009.  [name] BP has been controlled at home and patient denies any cardiac symptoms as chest pain, palpitations, shortness of breath, dizziness or ankle swelling. Today's BP is elevated at 52/70.       [name] hyperlipidemia is controlled with diet and medications. Patient denies myalgias or other medication SE's. Last lipids were at goal:  Lab Results  Component Value Date   CHOL 160 05/13/2019   HDL 36 (L) 05/13/2019   LDLCALC 87 05/13/2019   TRIG 279 (H) 05/13/2019   CHOLHDL 4.4 05/13/2019       Patient has hx/o T1_IDDM w/CKD3b (GFR 32) since 1996 and she was started on Insulin in 2000.   Patient has had chronically poor control consequent of her limited comprehension and consequent poor compliance as reflected in her chronically elevated A1c's. Patient is followed by Dr's [facility] for her Diabetic Retinopathy with ongoing intraocular injections.     She is on Novolin 70/30 and reports am  FBG's range 150 - 200's+, Lunch CBG's around 200's+ . She usually takes Nov 70/30  - 15 u qam, and Lunch another 15 u and then at supper take 7-10 u. . She admits frequently eating drive-thru fast foods.        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 She has been  followed by Dr Lorrene Reid  (now retired) for her Diabetic CKD4  [phone]).    Dietary compliance has always been been poor & A1c was not at goal:  Lab Results  Component Value Date   HGBA1C 10.5 (H)  05/13/2019       Finally, patient has history of Vitamin D Deficiency and last Vitamin D was at goal:  Lab Results  Component Value Date   VD25OH 67 01/30/2019    Current Outpatient Medications on File Prior to Visit  Medication Sig  . aspirin 81 MG tablet Take 81 mg by mouth daily.  . ASSURE COMFORT LANCETS 30G MISC Check blood sugar 3 times daily-DX-E11.22  . bisoprolol-hydrochlorothiazide (ZIAC) 5-6.25 MG tablet Take 1 tablet Daily for BP  . Blood Glucose Calibration (ACCU-CHEK AVIVA) SOLN Check blood sugar 3 times daily-DX-E11.22  . blood glucose meter kit and supplies KIT Test blood sugar three times a day for Diabetes. Dx:E11.8/ Z79.4 .  Marland Kitchen blood glucose meter kit and supplies KIT Dispense based on patient and insurance preference. Use up to three times daily as directed. Dx:  . buPROPion (WELLBUTRIN XL) 150 MG 24 hr tablet Take 1 tablet every Morning for Mood , Focus & Concentration  . Cholecalciferol (VITAMIN D3) 2000 UNITS TABS Take 1 capsule by mouth 2 (two) times daily.  . COMFORT EZ PEN NEEDLES 32G X 4 MM MISC USE 2 TIMES DAILY AS DIRECTED , 1 PEN NEEDLE PER INJECTION.  Marland Kitchen Continuous Blood Gluc Sensor MISC Check blood sugar 3 times a day- Dx: E11.8/Z79.4  . dorzolamide-timolol (COSOPT) 22.3-6.8 MG/ML ophthalmic solution   . EASY COMFORT INSULIN SYRINGE 30G X 1/2" 0.5 ML  MISC   . hyoscyamine (LEVSIN SL) 0.125 MG SL tablet Take 1 to 2 tablets 3 to 4 x day if needed for Nausea, vomiting, cramping or diarrhea  . Insulin Isophane & Regular Human (NOVOLIN 70/30 FLEXPEN RELION) (70-30) 100 UNIT/ML PEN Inject 15-20 units subcutaneously with breakfast and dinner as directed for diabetes/high blood sugar.  . lactulose (CHRONULAC) 10 GM/15ML solution Take 15 ml (3 tsp) once or twice daily as needed for constipation.  Marland Kitchen latanoprost (XALATAN) 0.005 % ophthalmic solution   . LOTEMAX 0.5 % GEL   . Multiple Vitamins-Minerals (HAIR/SKIN/NAILS) TABS Take by mouth 2 (two) times daily.  Glory Rosebush VERIO test strip USE  STRIP TO CHECK GLUCOSE THREE TIMES DAILY AS DIRECTED  . rOPINIRole (REQUIP) 3 MG tablet Take 1 to 2 tablets at Bedtime as needed for Restless Legs  . rosuvastatin (CRESTOR) 20 MG tablet Take 1 tablet Daily for Cholesterol  . senna-docusate (SENOKOT-S) 8.6-50 MG tablet Take 1 tablet by mouth daily.  . vitamin B-12 (CYANOCOBALAMIN) 1000 MCG tablet Take 1,000 mcg by mouth daily.  . vitamin C (ASCORBIC ACID) 500 MG tablet Take 500 mg by mouth daily.  Marland Kitchen gabapentin (NEURONTIN) 100 MG capsule Take 1 cap with breakfast and lunch as needed for foot pain; can take 1-2 caps with dinner or prior to bedtime; max total of 4 caps daily by mouth. (Patient not taking: Reported on 08/30/2019)  . Magnesium 400 MG TABS Take 1 tablet by mouth daily.   No current facility-administered medications on file prior to visit.   Allergies  Allergen Reactions  . Morphine And Related Other (See Comments)    fuzzy feeling   Past Medical History:  Diagnosis Date  . Arthritis   . Chronic back pain   . Diabetic neuropathy ([place])   . Hypercholesteremia   . Hypertension   . Liver lesion   . Neuropathy   . Type II or unspecified type diabetes mellitus without mention of complication, not stated as uncontrolled   . Vitamin D deficiency   . Wears glasses    Health Maintenance  Topic Date Due  . OPHTHALMOLOGY EXAM  09/04/2019  . HEMOGLOBIN A1C  11/10/2019  . INFLUENZA VACCINE  11/24/2019  . FOOT EXAM  08/28/2020  . TETANUS/TDAP  01/03/2021  . DEXA SCAN  Completed  . COVID-19 Vaccine  Completed  . PNA vac Low Risk Adult  Completed   Immunization History  Administered Date(s) Administered  . DT (Pediatric) 01/04/2011  . Influenza Split 01/23/2013  . Influenza, High Dose Seasonal PF 02/27/2014, 03/11/2015, 12/21/2015, 01/30/2017, 02/08/2018, 01/02/2019  . PFIZER SARS-COV-2 Vaccination 07/22/2019, 08/13/2019  . Pneumococcal Conjugate-13 02/27/2014  . Pneumococcal Polysaccharide-23  01/06/2012    Last Colon - 02/17/2005 _ Dr Amedeo Plenty - aged out for F/U  Last MGM - 11/14/2018  Past Surgical History:  Procedure Laterality Date  . ABDOMINAL HYSTERECTOMY    . APPENDECTOMY    . BACK SURGERY  1993   lumb lam-fusion  . BREAST EXCISIONAL BIOPSY Right 1967   benign  . CESAREAN SECTION     x 3   . COLONOSCOPY    . EYE SURGERY     both cataracts  . TRIGGER FINGER RELEASE  01/10/2012   Procedure: RELEASE TRIGGER FINGER/A-1 PULLEY;  Surgeon: Hessie Dibble, MD;  Location: [facility];  Service: Orthopedics;  Laterality: Right;  right ring trigger release   Family History  Problem Relation Age of Onset  . Liver disease Sister   .  Diabetes Sister   . Colon cancer Neg Hx    Social History   Tobacco Use  . Smoking status: Never Smoker  . Smokeless tobacco: Never Used  Substance Use Topics  . Alcohol use: No  . Drug use: No    ROS Constitutional: Denies fever, chills, weight loss/gain, headaches, insomnia,  night sweats, and change in appetite. Does c/o fatigue. Eyes: Denies redness, blurred vision, diplopia, discharge, itchy, watery eyes.  ENT: Denies discharge, congestion, post nasal drip, epistaxis, sore throat, earache, hearing loss, dental pain, Tinnitus, Vertigo, Sinus pain, snoring.  Cardio: Denies chest pain, palpitations, irregular heartbeat, syncope, dyspnea, diaphoresis, orthopnea, PND, claudication, edema Respiratory: denies cough, dyspnea, DOE, pleurisy, hoarseness, laryngitis, wheezing.  Gastrointestinal: Denies dysphagia, heartburn, reflux, water brash, pain, cramps, nausea, vomiting, bloating, diarrhea, constipation, hematemesis, melena, hematochezia, jaundice, hemorrhoids Genitourinary: Denies dysuria, frequency, urgency, nocturia, hesitancy, discharge, hematuria, flank pain Breast: Breast lumps, nipple discharge, bleeding.  Musculoskeletal: Denies arthralgia, myalgia, stiffness, Jt. Swelling, pain, limp, and strain/sprain. Denies  falls. Skin: Denies puritis, rash, hives, warts, acne, eczema, changing in skin lesion Neuro: No weakness, tremor, incoordination, spasms, paresthesia, pain Psychiatric: Denies confusion, memory loss, sensory loss. Denies Depression. Endocrine: Denies change in weight, skin, hair change, nocturia, and paresthesia, diabetic polys, visual blurring, hyper / hypo glycemic episodes.  Heme/Lymph: No excessive bleeding, bruising, enlarged lymph nodes.  Physical Exam  BP (!) 152/70   Pulse 61   Temp (!) 97.4 F (36.3 C)   Resp 16   Ht 5' 3"  (1.6 m)   Wt 119 lb 3.2 oz (54.1 kg)   SpO2 99%   BMI 21.12 kg/m   General Appearance: Well nourished, well groomed and in no apparent distress.  Eyes: PERRLA, EOMs, conjunctiva no swelling or erythema, normal fundi and vessels. Sinuses: No frontal/maxillary tenderness ENT/Mouth: EACs patent / TMs  nl. Nares clear without erythema, swelling, mucoid exudates. Oral hygiene is good. No erythema, swelling, or exudate. Tongue normal, non-obstructing. Tonsils not swollen or erythematous. Hearing normal.  Neck: Supple, thyroid not palpable. No bruits, nodes or JVD. Respiratory: Respiratory effort normal.  BS equal and clear bilateral without rales, rhonci, wheezing or stridor. Cardio: Heart sounds are normal with regular rate and rhythm and no murmurs, rubs or gallops. Peripheral pulses are normal and equal bilaterally without edema. No aortic or femoral bruits. Chest: symmetric with normal excursions and percussion. Breasts: Symmetric, without lumps, nipple discharge, retractions, or fibrocystic changes.  Abdomen: Flat, soft with bowel sounds active. Nontender, no guarding, rebound, hernias, masses, or organomegaly.  Lymphatics: Non tender without lymphadenopathy.  Musculoskeletal: Full ROM all peripheral extremities, joint stability, 5/5 strength, and normal gait. Skin: Warm and dry without rashes, lesions, cyanosis, clubbing or  ecchymosis.  Neuro: Cranial  nerves intact, reflexes equal bilaterally. Normal muscle tone, no cerebellar symptoms. Sensation decreased in a stocking distribution to the toes bilaterally to touch, vibratory and Monofilament. Pysch: Alert and oriented X 3, normal affect, Insight and Judgment appropriate.   Assessment and Plan  1. Annual Preventative Screening Examination  2. Essential hypertension  - EKG 12-Lead - Urinalysis, Routine w reflex microscopic - Microalbumin / creatinine urine ratio - CBC with Differential/Platelet - COMPLETE METABOLIC PANEL WITH GFR - Magnesium - TSH  3. Hyperlipidemia associated with type 2 diabetes mellitus ([place])  - EKG 12-Lead - Lipid panel - TSH  4. Type 2 diabetes mellitus with stage 3b chronic kidney disease, with long-term current use of insulin (HCC)  - EKG 12-Lead - Hemoglobin A1c  5. Vitamin D  deficiency  - VITAMIN D 25 Hydroxy  6. Neuropathy due to type 2 diabetes mellitus (HCC)  - HM DIABETES FOOT EXAM - LOW EXTREMITY NEUR EXAM DOCUM - Hemoglobin A1c  7. Diabetic retinopathy associated with type 2 diabetes  mellitus, macular edema, retinopathy severity (HCC)  - Hemoglobin A1c  8. Hypothyroidism, unspecified type  - TSH  9. Screening for ischemic heart disease  - EKG 12-Lead  10. FHx: heart disease  - EKG 12-Lead  11. Atherosclerosis of aorta (HCC)  - EKG 12-Lead - Lipid panel  12. Medication management  - Urinalysis, Routine w reflex microscopic - Microalbumin / creatinine urine ratio - CBC with Differential/Platelet - COMPLETE METABOLIC PANEL WITH GFR - Magnesium - Lipid panel - TSH - Hemoglobin A1c - VITAMIN D 25 Hydroxy  13. Screening for colorectal cancer  - POC Hemoccult Bld/Stl        Patient was counseled in prudent diet to achieve/maintain BMI less than 25 for weight control, BP monitoring, regular exercise and medications. Discussed med's effects and SE's. Screening labs and tests as requested with regular follow-up as  recommended. Over 40 minutes of exam, counseling, chart review and high complex critical decision making was performed.   Kirtland Bouchard, MD

## 2019-08-30 ENCOUNTER — Other Ambulatory Visit: Payer: Self-pay

## 2019-08-30 ENCOUNTER — Ambulatory Visit (INDEPENDENT_AMBULATORY_CARE_PROVIDER_SITE_OTHER): Payer: Medicare Other | Admitting: Internal Medicine

## 2019-08-30 ENCOUNTER — Other Ambulatory Visit: Payer: Self-pay | Admitting: Internal Medicine

## 2019-08-30 VITALS — BP 152/70 | HR 61 | Temp 97.4°F | Resp 16 | Ht 63.0 in | Wt 119.2 lb

## 2019-08-30 DIAGNOSIS — E785 Hyperlipidemia, unspecified: Secondary | ICD-10-CM

## 2019-08-30 DIAGNOSIS — R300 Dysuria: Secondary | ICD-10-CM | POA: Diagnosis not present

## 2019-08-30 DIAGNOSIS — Z0001 Encounter for general adult medical examination with abnormal findings: Secondary | ICD-10-CM

## 2019-08-30 DIAGNOSIS — E559 Vitamin D deficiency, unspecified: Secondary | ICD-10-CM

## 2019-08-30 DIAGNOSIS — I10 Essential (primary) hypertension: Secondary | ICD-10-CM

## 2019-08-30 DIAGNOSIS — Z136 Encounter for screening for cardiovascular disorders: Secondary | ICD-10-CM

## 2019-08-30 DIAGNOSIS — E1140 Type 2 diabetes mellitus with diabetic neuropathy, unspecified: Secondary | ICD-10-CM | POA: Diagnosis not present

## 2019-08-30 DIAGNOSIS — I700 Atherosclerosis of aorta: Secondary | ICD-10-CM

## 2019-08-30 DIAGNOSIS — Z0000 Encounter for general adult medical examination without abnormal findings: Secondary | ICD-10-CM | POA: Diagnosis not present

## 2019-08-30 DIAGNOSIS — Z1211 Encounter for screening for malignant neoplasm of colon: Secondary | ICD-10-CM

## 2019-08-30 DIAGNOSIS — G2581 Restless legs syndrome: Secondary | ICD-10-CM

## 2019-08-30 DIAGNOSIS — Z79899 Other long term (current) drug therapy: Secondary | ICD-10-CM

## 2019-08-30 DIAGNOSIS — E1169 Type 2 diabetes mellitus with other specified complication: Secondary | ICD-10-CM

## 2019-08-30 DIAGNOSIS — E11319 Type 2 diabetes mellitus with unspecified diabetic retinopathy without macular edema: Secondary | ICD-10-CM | POA: Diagnosis not present

## 2019-08-30 DIAGNOSIS — Z794 Long term (current) use of insulin: Secondary | ICD-10-CM

## 2019-08-30 DIAGNOSIS — E1121 Type 2 diabetes mellitus with diabetic nephropathy: Secondary | ICD-10-CM | POA: Diagnosis not present

## 2019-08-30 DIAGNOSIS — Z8249 Family history of ischemic heart disease and other diseases of the circulatory system: Secondary | ICD-10-CM | POA: Diagnosis not present

## 2019-08-30 DIAGNOSIS — E1122 Type 2 diabetes mellitus with diabetic chronic kidney disease: Secondary | ICD-10-CM

## 2019-08-30 DIAGNOSIS — E039 Hypothyroidism, unspecified: Secondary | ICD-10-CM

## 2019-08-30 DIAGNOSIS — Z1212 Encounter for screening for malignant neoplasm of rectum: Secondary | ICD-10-CM

## 2019-08-30 DIAGNOSIS — N1832 Chronic kidney disease, stage 3b: Secondary | ICD-10-CM | POA: Diagnosis not present

## 2019-08-30 MED ORDER — GABAPENTIN 300 MG PO CAPS: ORAL_CAPSULE | ORAL | 1 refills | Status: DC

## 2019-08-31 LAB — COMPLETE METABOLIC PANEL WITH GFR
AG Ratio: 1.2 (calc) (ref 1.0–2.5)
ALT: 14 U/L (ref 6–29)
AST: 15 U/L (ref 10–35)
Albumin: 4 g/dL (ref 3.6–5.1)
Alkaline phosphatase (APISO): 113 U/L (ref 37–153)
BUN/Creatinine Ratio: 30 (calc) — ABNORMAL HIGH (ref 6–22)
BUN: 36 mg/dL — ABNORMAL HIGH (ref 7–25)
CO2: 29 mmol/L (ref 20–32)
Calcium: 9.8 mg/dL (ref 8.6–10.4)
Chloride: 99 mmol/L (ref 98–110)
Creat: 1.22 mg/dL — ABNORMAL HIGH (ref 0.60–0.88)
GFR, Est African American: 46 mL/min/{1.73_m2} — ABNORMAL LOW (ref >=60)
GFR, Est Non African American: 40 mL/min/{1.73_m2} — ABNORMAL LOW (ref >=60)
Globulin: 3.3 g/dL (calc) (ref 1.9–3.7)
Glucose, Bld: 342 mg/dL — ABNORMAL HIGH (ref 65–99)
Potassium: 4.5 mmol/L (ref 3.5–5.3)
Sodium: 138 mmol/L (ref 135–146)
Total Bilirubin: 0.4 mg/dL (ref 0.2–1.2)
Total Protein: 7.3 g/dL (ref 6.1–8.1)

## 2019-08-31 LAB — HEMOGLOBIN A1C
Hgb A1c MFr Bld: 9.9 % of total Hgb — ABNORMAL HIGH (ref <5.7)
Mean Plasma Glucose: 237 (calc)
eAG (mmol/L): 13.2 (calc)

## 2019-08-31 LAB — CBC WITH DIFFERENTIAL/PLATELET
Absolute Monocytes: 720 cells/uL (ref 200–950)
Basophils Absolute: 80 cells/uL (ref 0–200)
Basophils Relative: 0.8 %
Eosinophils Absolute: 340 cells/uL (ref 15–500)
Eosinophils Relative: 3.4 %
HCT: 40.4 % (ref 35.0–45.0)
Hemoglobin: 13.2 g/dL (ref 11.7–15.5)
Lymphs Abs: 3270 cells/uL (ref 850–3900)
MCH: 31.8 pg (ref 27.0–33.0)
MCHC: 32.7 g/dL (ref 32.0–36.0)
MCV: 97.3 fL (ref 80.0–100.0)
MPV: 12.4 fL (ref 7.5–12.5)
Monocytes Relative: 7.2 %
Neutro Abs: 5590 cells/uL (ref 1500–7800)
Neutrophils Relative %: 55.9 %
Platelets: 284 10*3/uL (ref 140–400)
RBC: 4.15 10*6/uL (ref 3.80–5.10)
RDW: 11.4 % (ref 11.0–15.0)
Total Lymphocyte: 32.7 %
WBC: 10 10*3/uL (ref 3.8–10.8)

## 2019-08-31 LAB — LIPID PANEL
Cholesterol: 146 mg/dL (ref <200)
HDL: 52 mg/dL (ref >=50)
LDL Cholesterol (Calc): 71 mg/dL (calc)
Non-HDL Cholesterol (Calc): 94 mg/dL (calc) (ref <130)
Total CHOL/HDL Ratio: 2.8 (calc) (ref <5.0)
Triglycerides: 142 mg/dL (ref <150)

## 2019-08-31 LAB — MAGNESIUM: Magnesium: 1.9 mg/dL (ref 1.5–2.5)

## 2019-08-31 LAB — MICROALBUMIN / CREATININE URINE RATIO
Creatinine, Urine: 57 mg/dL (ref 20–275)
Microalb Creat Ratio: 79 mcg/mg creat — ABNORMAL HIGH (ref <30)
Microalb, Ur: 4.5 mg/dL

## 2019-08-31 LAB — URINALYSIS, ROUTINE W REFLEX MICROSCOPIC
Bilirubin Urine: NEGATIVE
Hyaline Cast: NONE SEEN /LPF
Ketones, ur: NEGATIVE
Nitrite: POSITIVE — AB
Protein, ur: NEGATIVE
Specific Gravity, Urine: 1.018 (ref 1.001–1.03)
Squamous Epithelial / HPF: NONE SEEN /HPF (ref <=5)
WBC, UA: >=60 /HPF — AB (ref 0–5)
pH: <=5 (ref 5.0–8.0)

## 2019-08-31 LAB — VITAMIN D 25 HYDROXY (VIT D DEFICIENCY, FRACTURES): Vit D, 25-Hydroxy: 78 ng/mL (ref 30–100)

## 2019-08-31 LAB — TSH: TSH: 0.55 mIU/L (ref 0.40–4.50)

## 2019-09-01 ENCOUNTER — Other Ambulatory Visit: Payer: Self-pay | Admitting: Internal Medicine

## 2019-09-01 DIAGNOSIS — B9689 Other specified bacterial agents as the cause of diseases classified elsewhere: Secondary | ICD-10-CM

## 2019-09-01 DIAGNOSIS — N390 Urinary tract infection, site not specified: Secondary | ICD-10-CM

## 2019-09-01 LAB — URINE CULTURE
MICRO NUMBER:: 10453707
SPECIMEN QUALITY:: ADEQUATE

## 2019-09-01 MED ORDER — SULFAMETHOXAZOLE-TRIMETHOPRIM 800-160 MG PO TABS: ORAL_TABLET | ORAL | 0 refills | Status: DC

## 2019-09-04 DIAGNOSIS — H353231 Exudative age-related macular degeneration, bilateral, with active choroidal neovascularization: Secondary | ICD-10-CM | POA: Diagnosis not present

## 2019-09-04 DIAGNOSIS — H04123 Dry eye syndrome of bilateral lacrimal glands: Secondary | ICD-10-CM | POA: Diagnosis not present

## 2019-09-04 DIAGNOSIS — E119 Type 2 diabetes mellitus without complications: Secondary | ICD-10-CM | POA: Diagnosis not present

## 2019-09-04 DIAGNOSIS — H401131 Primary open-angle glaucoma, bilateral, mild stage: Secondary | ICD-10-CM | POA: Diagnosis not present

## 2019-09-04 DIAGNOSIS — Z961 Presence of intraocular lens: Secondary | ICD-10-CM | POA: Diagnosis not present

## 2019-09-04 LAB — HM DIABETES EYE EXAM

## 2019-09-06 DIAGNOSIS — S63212A Subluxation of metacarpophalangeal joint of right middle finger, initial encounter: Secondary | ICD-10-CM | POA: Diagnosis not present

## 2019-09-10 ENCOUNTER — Other Ambulatory Visit: Payer: Self-pay | Admitting: Internal Medicine

## 2019-09-10 DIAGNOSIS — E1140 Type 2 diabetes mellitus with diabetic neuropathy, unspecified: Secondary | ICD-10-CM

## 2019-09-10 DIAGNOSIS — S66312A Strain of extensor muscle, fascia and tendon of right middle finger at wrist and hand level, initial encounter: Secondary | ICD-10-CM | POA: Diagnosis not present

## 2019-09-10 MED ORDER — GABAPENTIN 300 MG PO CAPS: ORAL_CAPSULE | ORAL | 1 refills | Status: DC

## 2019-09-11 ENCOUNTER — Encounter: Payer: Self-pay | Admitting: *Deleted

## 2019-09-12 ENCOUNTER — Other Ambulatory Visit: Payer: Self-pay | Admitting: *Deleted

## 2019-09-12 DIAGNOSIS — Z1211 Encounter for screening for malignant neoplasm of colon: Secondary | ICD-10-CM | POA: Diagnosis not present

## 2019-09-12 DIAGNOSIS — Z1212 Encounter for screening for malignant neoplasm of rectum: Secondary | ICD-10-CM | POA: Diagnosis not present

## 2019-09-12 LAB — POC HEMOCCULT BLD/STL (HOME/3-CARD/SCREEN)
Card #2 Fecal Occult Blod, POC: NEGATIVE
Card #3 Fecal Occult Blood, POC: NEGATIVE
Fecal Occult Blood, POC: NEGATIVE

## 2019-09-13 DIAGNOSIS — S66392A Other injury of extensor muscle, fascia and tendon of right middle finger at wrist and hand level, initial encounter: Secondary | ICD-10-CM | POA: Diagnosis not present

## 2019-09-13 DIAGNOSIS — S66312A Strain of extensor muscle, fascia and tendon of right middle finger at wrist and hand level, initial encounter: Secondary | ICD-10-CM | POA: Diagnosis not present

## 2019-09-26 DIAGNOSIS — S66302D Unspecified injury of extensor muscle, fascia and tendon of right middle finger at wrist and hand level, subsequent encounter: Secondary | ICD-10-CM | POA: Diagnosis not present

## 2019-10-02 DIAGNOSIS — S66302D Unspecified injury of extensor muscle, fascia and tendon of right middle finger at wrist and hand level, subsequent encounter: Secondary | ICD-10-CM | POA: Diagnosis not present

## 2019-10-08 ENCOUNTER — Other Ambulatory Visit: Payer: Self-pay | Admitting: Internal Medicine

## 2019-10-08 DIAGNOSIS — E782 Mixed hyperlipidemia: Secondary | ICD-10-CM

## 2019-10-09 DIAGNOSIS — S66302D Unspecified injury of extensor muscle, fascia and tendon of right middle finger at wrist and hand level, subsequent encounter: Secondary | ICD-10-CM | POA: Diagnosis not present

## 2019-10-16 ENCOUNTER — Other Ambulatory Visit: Payer: Self-pay | Admitting: Adult Health Nurse Practitioner

## 2019-10-17 ENCOUNTER — Other Ambulatory Visit: Payer: Self-pay | Admitting: Adult Health

## 2019-10-18 DIAGNOSIS — S66302D Unspecified injury of extensor muscle, fascia and tendon of right middle finger at wrist and hand level, subsequent encounter: Secondary | ICD-10-CM | POA: Diagnosis not present

## 2019-10-25 DIAGNOSIS — S66302D Unspecified injury of extensor muscle, fascia and tendon of right middle finger at wrist and hand level, subsequent encounter: Secondary | ICD-10-CM | POA: Diagnosis not present

## 2019-10-29 DIAGNOSIS — S66312A Strain of extensor muscle, fascia and tendon of right middle finger at wrist and hand level, initial encounter: Secondary | ICD-10-CM | POA: Diagnosis not present

## 2019-10-30 ENCOUNTER — Other Ambulatory Visit: Payer: Self-pay | Admitting: Internal Medicine

## 2019-10-30 DIAGNOSIS — Z1231 Encounter for screening mammogram for malignant neoplasm of breast: Secondary | ICD-10-CM

## 2019-11-13 ENCOUNTER — Other Ambulatory Visit: Payer: Self-pay | Admitting: Orthopaedic Surgery

## 2019-11-13 DIAGNOSIS — M4306 Spondylolysis, lumbar region: Secondary | ICD-10-CM

## 2019-11-15 ENCOUNTER — Ambulatory Visit: Payer: Medicare Other

## 2019-11-18 ENCOUNTER — Encounter (INDEPENDENT_AMBULATORY_CARE_PROVIDER_SITE_OTHER): Payer: Self-pay | Admitting: Ophthalmology

## 2019-11-18 ENCOUNTER — Other Ambulatory Visit: Payer: Self-pay

## 2019-11-18 ENCOUNTER — Ambulatory Visit (INDEPENDENT_AMBULATORY_CARE_PROVIDER_SITE_OTHER): Payer: Medicare Other | Admitting: Ophthalmology

## 2019-11-18 DIAGNOSIS — H353114 Nonexudative age-related macular degeneration, right eye, advanced atrophic with subfoveal involvement: Secondary | ICD-10-CM

## 2019-11-18 DIAGNOSIS — H353222 Exudative age-related macular degeneration, left eye, with inactive choroidal neovascularization: Secondary | ICD-10-CM | POA: Diagnosis not present

## 2019-11-18 DIAGNOSIS — H353211 Exudative age-related macular degeneration, right eye, with active choroidal neovascularization: Secondary | ICD-10-CM

## 2019-11-18 HISTORY — DX: Exudative age-related macular degeneration, left eye, with inactive choroidal neovascularization: H35.3222

## 2019-11-18 MED ORDER — BEVACIZUMAB CHEMO INJECTION 1.25MG/0.05ML SYRINGE FOR KALEIDOSCOPE
1.2500 mg | INTRAVITREAL | Status: AC | PRN
  Administered 2019-11-18: 1.25 mg via INTRAVITREAL

## 2019-11-18 NOTE — Progress Notes (Signed)
11/18/2019     CHIEF COMPLAINT Patient presents for Retina Follow Up   HISTORY OF PRESENT ILLNESS: [name] is a 86 y.o. female who presents to the clinic today for:   HPI    Retina Follow Up    Patient presents with  Wet AMD.  In right eye.  This started 3 months ago.  Severity is moderate.  Duration of 3 months.  Since onset it is gradually worsening.          Comments    3 Month AMD F/U OU, poss Avastin OD  Pt c/o gradually worsening VA OD since last visit. Pt sts she is still able to read large print. VA OS stable.       Last edited by Rockie Neighbours, [place] on 11/18/2019 10:08 AM. (History)      Referring physician: [name], MD [address] [place],  [place] [address]  HISTORICAL INFORMATION:   Selected notes from the MEDICAL RECORD NUMBER    Lab Results  Component Value Date   HGBA1C 9.9 (H) 08/30/2019     CURRENT MEDICATIONS: Current Outpatient Medications (Ophthalmic Drugs)  Medication Sig  . dorzolamide-timolol (COSOPT) 22.3-6.8 MG/ML ophthalmic solution   . latanoprost (XALATAN) 0.005 % ophthalmic solution   . LOTEMAX 0.5 % GEL    No current facility-administered medications for this visit. (Ophthalmic Drugs)   Current Outpatient Medications (Other)  Medication Sig  . aspirin 81 MG tablet Take 81 mg by mouth daily.  . ASSURE COMFORT LANCETS 30G MISC Check blood sugar 3 times daily-DX-E11.22  . bisoprolol-hydrochlorothiazide (ZIAC) 5-6.25 MG tablet Take 1 tablet Daily for BP  . Blood Glucose Calibration (ACCU-CHEK AVIVA) SOLN Check blood sugar 3 times daily-DX-E11.22  . blood glucose meter kit and supplies KIT Test blood sugar three times a day for Diabetes. Dx:E11.8/ Z79.4 .  Marland Kitchen blood glucose meter kit and supplies KIT Dispense based on patient and insurance preference. Use up to three times daily as directed. Dx:  . buPROPion (WELLBUTRIN XL) 150 MG 24 hr tablet Take 1 tablet every Morning for Mood , Focus & Concentration    . Cholecalciferol (VITAMIN D3) 2000 UNITS TABS Take 1 capsule by mouth 2 (two) times daily.  . COMFORT EZ PEN NEEDLES 32G X 4 MM MISC USE 2 TIMES DAILY AS DIRECTED , 1 PEN NEEDLE PER INJECTION.  Marland Kitchen Continuous Blood Gluc Sensor MISC Check blood sugar 3 times a day- Dx: E11.8/Z79.4  . EASY COMFORT INSULIN SYRINGE 30G X 1/2" 0.5 ML MISC   . gabapentin (NEURONTIN) 300 MG capsule Take 1 capsule 3 to 4 x / day for Painful Diabetic Neuropathy  . hyoscyamine (LEVSIN SL) 0.125 MG SL tablet Take 1 to 2 tablets 3 to 4 x day if needed for Nausea, vomiting, cramping or diarrhea  . Insulin Isophane & Regular Human (NOVOLIN 70/30 FLEXPEN RELION) (70-30) 100 UNIT/ML PEN Inject 15-20 units subcutaneously with breakfast and dinner as directed for diabetes/high blood sugar.  . lactulose (CHRONULAC) 10 GM/15ML solution Take 15 ml (3 tsp) once or twice daily as needed for constipation.  . Magnesium 400 MG TABS Take 1 tablet by mouth daily.  . Multiple Vitamins-Minerals (HAIR/SKIN/NAILS) TABS Take by mouth 2 (two) times daily.  Glory Rosebush VERIO test strip USE 1 STRIP TO CHECK GLUCOSE THREE TIMES DAILY AS DIRECTED  . rOPINIRole (REQUIP) 3 MG tablet Take 2 tablets   at Bedtime    for Restless Legs  . rosuvastatin (CRESTOR) 20 MG  tablet TAKE 1 TABLET BY MOUTH ONCE DAILY FOR CHOLESTEROL  . senna-docusate (SENOKOT-S) 8.6-50 MG tablet Take 1 tablet by mouth daily.  Marland Kitchen sulfamethoxazole-trimethoprim (BACTRIM DS) 800-160 MG tablet Take 1 tablet 2 x /day with Meals for Infection  . vitamin B-12 (CYANOCOBALAMIN) 1000 MCG tablet Take 1,000 mcg by mouth daily.  . vitamin C (ASCORBIC ACID) 500 MG tablet Take 500 mg by mouth daily.   No current facility-administered medications for this visit. (Other)      REVIEW OF SYSTEMS:    ALLERGIES Allergies  Allergen Reactions  . Morphine And Related Other (See Comments)    fuzzy feeling    PAST MEDICAL HISTORY Past Medical History:  Diagnosis Date  . Arthritis   . Chronic  back pain   . Diabetic neuropathy ([place])   . Hypercholesteremia   . Hypertension   . Liver lesion   . Neuropathy   . Type II or unspecified type diabetes mellitus without mention of complication, not stated as uncontrolled   . Vitamin D deficiency   . Wears glasses    Past Surgical History:  Procedure Laterality Date  . ABDOMINAL HYSTERECTOMY    . APPENDECTOMY    . BACK SURGERY  1993   lumb lam-fusion  . BREAST EXCISIONAL BIOPSY Right 1967   benign  . CESAREAN SECTION     x 3   . COLONOSCOPY    . EYE SURGERY     both cataracts  . TRIGGER FINGER RELEASE  01/10/2012   Procedure: RELEASE TRIGGER FINGER/A-1 PULLEY;  Surgeon: Hessie Dibble, MD;  Location: [facility];  Service: Orthopedics;  Laterality: Right;  right ring trigger release    FAMILY HISTORY Family History  Problem Relation Age of Onset  . Liver disease Sister   . Diabetes Sister   . Colon cancer Neg Hx     SOCIAL HISTORY Social History   Tobacco Use  . Smoking status: Never Smoker  . Smokeless tobacco: Never Used  Substance Use Topics  . Alcohol use: No  . Drug use: No         OPHTHALMIC EXAM:  Base Eye Exam    Visual Acuity (ETDRS)      Right Left   Dist cc 20/400 20/60   Dist ph cc 20/100 -1 20/50   Correction: Glasses       Tonometry (Tonopen, 10:12 AM)      Right Left   Pressure 11 10       Pupils      Pupils Dark Light Shape React APD   Right PERRL 2 2 Round Minimal None   Left PERRL 2 2 Round Minimal None       Visual Fields (Counting fingers)      Left Right    Full Full       Extraocular Movement      Right Left    Full Full       Neuro/Psych    Oriented x3: Yes   Mood/Affect: Normal       Dilation    Both eyes: 1.0% Mydriacyl, 2.5% Phenylephrine @ 10:12 AM        Slit Lamp and Fundus Exam    External Exam      Right Left   External Normal Normal       Slit Lamp Exam      Right Left   Lids/Lashes Normal Normal   Conjunctiva/Sclera White  and quiet White and quiet   Cornea Clear  Clear   Anterior Chamber Deep and quiet Deep and quiet   Iris Round and reactive Round and reactive   Lens Posterior chamber intraocular lens Posterior chamber intraocular lens   Anterior Vitreous Normal Normal       Fundus Exam      Right Left   Posterior Vitreous Posterior vitreous detachment Posterior vitreous detachment   Disc Normal Normal   C/D Ratio 0.3 0.4   Macula Geographic atrophy, Advanced age related macular degeneration, no macular thickening, Retinal pigment epithelial mottling, no exudates Geographic atrophy, Advanced age related macular degeneration, no macular thickening, Retinal pigment epithelial mottling   Vessels Normal Normal   Periphery Normal Normal          IMAGING AND PROCEDURES  Imaging and Procedures for 11/18/19  OCT, Retina - OU - Both Eyes       Right Eye Quality was good. Scan locations included subfoveal. Central Foveal Thickness: 285. Progression has improved. Findings include subretinal hyper-reflective material, retinal drusen , central retinal atrophy, outer retinal atrophy, inner retinal atrophy.   Left Eye Central Foveal Thickness: 237. Progression has been stable. Findings include abnormal foveal contour, retinal drusen , subretinal hyper-reflective material.   Notes Loss of the the photoreceptor layer accounts for visual acuity OD, dry ARMD  OS no active CME.       Intravitreal Injection, Pharmacologic Agent - OD - Right Eye       Time Out 11/18/2019. 11:03 AM. Confirmed correct patient, procedure, site, and patient consented.   Anesthesia Topical anesthesia was used. Anesthetic medications included Akten 3.5%.   Procedure Preparation included Tobramycin 0.3%, Ofloxacin , 10% betadine to eyelids, 5% betadine to ocular surface. A 30 gauge needle was used.   Injection:  1.25 mg Bevacizumab (AVASTIN) SOLN   NDC: [address]-0, Lot: [id number]   Route: Intravitreal, Site: Right Eye, Waste:  0 mg  Post-op Post injection exam found visual acuity of at least counting fingers. The patient tolerated the procedure well. There were no complications. The patient received written and verbal post procedure care education. Post injection medications were not given.                 ASSESSMENT/PLAN:  Exudative age-related macular degeneration of right eye with active choroidal neovascularization (HCC) The nature of wet macular degeneration was discussed with the patient.  Forms of therapy reviewed include the use of Anti-VEGF medications injected painlessly into the eye, as well as other possible treatment modalities, including thermal laser therapy. Fellow eye involvement and risks were discussed with the patient. 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 The treatment regimen is on a treat as needed basis with the intent to treat if necessary and extend interval of exams when possible. On average 1 out of 6 patients do not need lifetime therapy. However, the risk of recurrent disease is high for a lifetime.  Initially monthly, then periodic, examinations and evaluations will determine whether the next treatment is required on the day of the examination.  Pete intravitreal Avastin OD today unlikely examination alone OU next with no planned injection      ICD-10-CM   1. Exudative age-related macular degeneration of right eye with active choroidal neovascularization (HCC)  H35.3211 OCT, Retina - OU - Both Eyes    Intravitreal Injection, Pharmacologic Agent - OD - Right Eye    Bevacizumab (AVASTIN) SOLN 1.25 mg  2. Advanced nonexudative age-related macular degeneration of right eye with subfoveal involvement  H35.3114  3. 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)  H35.3222     1.  2.  3.  Ophthalmic Meds Ordered this visit:  Meds ordered this encounter  Medications  .  Bevacizumab (AVASTIN) SOLN 1.25 mg       Return in about 3 months (around 02/18/2020) for DILATE OU, OCT, COLOR FP.  There are no Patient Instructions on file for this visit.   Explained the diagnoses, plan, and follow up with the patient and they expressed understanding.  Patient expressed understanding of the importance of proper follow up care.   Clent Demark Jamaria Amborn M.D. Diseases & Surgery of the Retina and Vitreous [facility] [place] 11/18/19     Abbreviations: M myopia (nearsighted); A astigmatism; H hyperopia (farsighted); P presbyopia; Mrx spectacle prescription;  CTL contact lenses; OD right eye; OS left eye; OU both eyes  XT exotropia; ET esotropia; PEK punctate epithelial keratitis; PEE punctate epithelial erosions; DES dry eye syndrome; MGD meibomian gland dysfunction; ATs artificial tears; PFAT's preservative free artificial tears; [place] nuclear sclerotic cataract; PSC posterior subcapsular cataract; ERM epi-retinal membrane; PVD posterior vitreous detachment; RD retinal detachment; DM diabetes mellitus; DR diabetic retinopathy; NPDR non-proliferative diabetic retinopathy; PDR proliferative diabetic retinopathy; CSME clinically significant macular edema; DME diabetic macular edema; dbh dot blot hemorrhages; CWS cotton wool spot; POAG primary open angle glaucoma; C/D cup-to-disc ratio; HVF humphrey visual field; GVF goldmann visual field; OCT optical coherence tomography; IOP intraocular pressure; BRVO Branch retinal vein occlusion; CRVO central retinal vein occlusion; CRAO central retinal artery occlusion; BRAO branch retinal artery occlusion; RT retinal tear; SB scleral buckle; PPV pars plana vitrectomy; VH Vitreous hemorrhage; PRP panretinal laser photocoagulation; IVK intravitreal kenalog; VMT vitreomacular traction; MH Macular hole;  NVD neovascularization of the disc; NVE neovascularization elsewhere; AREDS age related eye disease study; ARMD age related macular degeneration;  POAG primary open angle glaucoma; EBMD epithelial/anterior basement membrane dystrophy; ACIOL anterior chamber intraocular lens; IOL intraocular lens; PCIOL posterior chamber intraocular lens; Phaco/IOL phacoemulsification with intraocular lens placement; Lead photorefractive keratectomy; LASIK laser assisted in situ keratomileusis; HTN hypertension; DM diabetes mellitus; COPD chronic obstructive pulmonary disease

## 2019-11-18 NOTE — Assessment & Plan Note (Signed)
The nature of wet macular degeneration was discussed with the patient.  Forms of therapy reviewed include the use of Anti-VEGF medications injected painlessly into the eye, as well as other possible treatment modalities, including thermal laser therapy. Fellow eye involvement and risks were discussed with the patient. 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 The treatment regimen is on a treat as needed basis with the intent to treat if necessary and extend interval of exams when possible. On average 1 out of 6 patients do not need lifetime therapy. However, the risk of recurrent disease is high for a lifetime.  Initially monthly, then periodic, examinations and evaluations will determine whether the next treatment is required on the day of the examination.  Pete intravitreal Avastin OD today unlikely examination alone OU next with no planned injection

## 2019-11-21 ENCOUNTER — Ambulatory Visit
Admission: RE | Admit: 2019-11-21 | Discharge: 2019-11-21 | Disposition: A | Discharge status: Home / Self Care | Payer: Medicare Other | Source: Ambulatory Visit | Attending: Orthopaedic Surgery | Admitting: Orthopaedic Surgery

## 2019-11-21 ENCOUNTER — Other Ambulatory Visit: Payer: Self-pay

## 2019-11-21 DIAGNOSIS — M545 Low back pain: Secondary | ICD-10-CM | POA: Diagnosis not present

## 2019-11-21 DIAGNOSIS — M4306 Spondylolysis, lumbar region: Secondary | ICD-10-CM

## 2019-11-21 MED ORDER — IOPAMIDOL (ISOVUE-M 200) INJECTION 41%
1.0000 mL | Freq: Once | INTRAMUSCULAR | Status: AC
  Administered 2019-11-21: 1 mL via EPIDURAL

## 2019-11-21 MED ORDER — METHYLPREDNISOLONE ACETATE 40 MG/ML INJ SUSP (RADIOLOG
120.0000 mg | Freq: Once | INTRAMUSCULAR | Status: AC
  Administered 2019-11-21: 120 mg via EPIDURAL

## 2019-11-21 NOTE — Discharge Instructions (Signed)

## 2019-11-28 DIAGNOSIS — S66312D Strain of extensor muscle, fascia and tendon of right middle finger at wrist and hand level, subsequent encounter: Secondary | ICD-10-CM | POA: Diagnosis not present

## 2019-12-18 ENCOUNTER — Other Ambulatory Visit: Payer: Self-pay | Admitting: Internal Medicine

## 2019-12-18 ENCOUNTER — Other Ambulatory Visit: Payer: Self-pay

## 2019-12-18 ENCOUNTER — Ambulatory Visit
Admission: RE | Admit: 2019-12-18 | Discharge: 2019-12-18 | Disposition: A | Discharge status: Home / Self Care | Payer: Medicare Other | Source: Ambulatory Visit | Attending: Internal Medicine | Admitting: Internal Medicine

## 2019-12-18 DIAGNOSIS — N63 Unspecified lump in unspecified breast: Secondary | ICD-10-CM

## 2019-12-18 DIAGNOSIS — Z1231 Encounter for screening mammogram for malignant neoplasm of breast: Secondary | ICD-10-CM

## 2019-12-20 NOTE — Progress Notes (Signed)
Patient ID: [name], female   DOB: Nov 09, 1929, 86 y.o.   MRN: [id number]  MEDICARE ANNUAL WELLNESS VISIT AND OV  Assessment:    Encounter for Medicare annual wellness exam  Essential hypertension - hypotensive - reduce from 1 tab 5/6.25 mg ziac - 1/2 tab until runs out, follow up in 4 weeks to recheck, then plan to do 2.5/6.25 mg dose to  DASH diet, exercise and monitor at home. Call if greater than 130/80. -     CBC with Differential/Platelet -     CMP/GFR -     TSH  Uncontrolled secondary diabetes mellitus with stage 3 CKD (GFR 30-59) (HCC)/ Long term current use of insulin ([place]) Discussed general issues about diabetes pathophysiology and management., Educational material distributed., Suggested low cholesterol diet., Encouraged aerobic exercise., Discussed foot care., Reminded to get yearly retinal exam. NO GLUCOSE LOG TO REVIEW; PATIENT CANNOT ADEQUATELY REPORT CURRENT INSULIN USE Restart glucose/insulin logs with food log; follow up in 4 weeks  -     Hemoglobin A1c  Ttype 2 diabetes with neuropathy ([place]) Discussed general issues about diabetes pathophysiology and management., Educational material distributed., Suggested low cholesterol diet., Encouraged aerobic exercise., Discussed foot care., Reminded to get yearly retinal exam. -     Hemoglobin A1c  Mixed hyperlipidemia associated with T2DM ([place]) -continue medications, check lipids, decrease fatty foods, increase activity.  -     Lipid panel  Macular degeneration (HCC) Getting injections by Dr. Zadie Rhine  Chronic pain syndrome monitor  Medication management -     Magnesium  Vitamin D deficiency At goal at recent check; continue to recommend supplementation for goal of 60-100 Defer vitamin D level  Atherosclerosis of aorta (HCC) Control blood pressure, cholesterol, glucose, increase exercise.  -     Lipid panel -     Hemoglobin A1c  Medication management -     CBC with Differential/Platelet -     COMPLETE  METABOLIC PANEL WITH GFR -     Magnesium  Major depression in partial remission (HCC)  low dose wellbutrin for energy and motivation  Lifestyle discussed: diet/exerise, sleep hygiene, stress management, hydration  Fatigue/dizziness Suspect low BP; cut BP med in 1/2, follow up in 4 weeks [place] hydration Check CBC, CMP, TSH, B12   Future Appointments  Date Time Provider [place]  12/26/2019  3:20 PM [facility] DI[facility] [place] 1 [facility]  12/26/2019  3:30 PM [facility] [place] 1 [facility]  02/18/2020 10:30 AM Rankin, Clent Demark, MD RDE-RDE None  03/25/2020 11:30 AM [name], MD [facility] None  09/09/2020 10:00 AM Liane Comber, NP [facility] None      Plan:   During the course of the visit the patient was educated and counseled about appropriate screening and preventive services including:    Pneumococcal vaccine   Influenza vaccine  Td vaccine  Screening electrocardiogram  Bone densitometry screening  Colorectal cancer screening  Diabetes screening  Glaucoma screening  Nutrition counseling   Advanced directives: requested   Subjective:   [name]  presents for Medicare Annual Wellness Visit and follow up for HTN, chol, DM with CKD/neuropathy.   She feels steady on feet in home but some difficulty on uneven ground, has cane and does well when she uses it.  She has a garden, helps gets some of the beans/garden, states she is not able to do as much as she use to, will rest often.   She has reported lack of motivation  to do anything, fatigue for a few weeks, prescribed Wellbutrin, taking irregularly, unsure if this helps, though mood is "fine."    BMI is Body mass index is 22.14 kg/m., she has been working on diet but hasn't been exercising, lack of energy and no safe walking around her house. She could go to church and walk in their gym, plans to go with husband or friend.  Wt Readings from Last 3 Encounters:   12/23/19 125 lb (56.7 kg)  08/30/19 119 lb 3.2 oz (54.1 kg)  06/24/19 122 lb (55.3 kg)   Patient is treated for HTN since 2000, admits hasn't been checking, notes recent BP med refill was ziac 5/62.5 ONE tab daily, previously was taking 1/2 tab for many years. BP: (!) 102/52, reports fatigued and light headed.   She has aortic atherosclerosis per CT 2017. Patient had a negative Cardiolite in 2009.  Patient has had no complaints of any cardiac type chest pain, palpitations, dyspnea/orthopnea/PND, dizziness, claudication, or dependent edema.  Hyperlipidemia is controlled with diet & meds, taking rosuvastatin 20 mg daily. Patient denies myalgias or other med SE's. Last Lipids were at goal  Lab Results  Component Value Date   CHOL 146 08/30/2019   HDL 52 08/30/2019   LDLCALC 71 08/30/2019   TRIG 142 08/30/2019   CHOLHDL 2.8 08/30/2019   Also, the patient has history of T2_IDDM x 1996 and has been on insulin since 2000 and she has complications w/ CKD and Neuropathy Off of metformin due to CKD 3b; on bASA, statin Not on ACE/ARB- patient declines she is on novolin 70/30, 18 units AM, then has sliding scale for PM, takes 22-25 units She reports checking glucose irregularly, commonly 200+, higher in afternoons, admits not keeping a log, this AM was 121.  She has had no symptoms of reactive hypoglycemia, diabetic polys or visual blurring.  She does report tingling and occasional painful paresthesias of the feet. She takes requip for RLS, reports hemp oil helping with foot pain.  Lab Results  Component Value Date   HGBA1C 9.9 (H) 08/30/2019   Lab Results  Component Value Date   GFRNONAA 40 (L) 08/30/2019   Further, the patient also has history of Vitamin D Deficiency, on supplements.  Lab Results  Component Value Date   VD25OH 78 08/30/2019     Current Outpatient Medications on File Prior to Visit  Medication Sig  . aspirin 81 MG tablet Take 81 mg by mouth daily.  . ASSURE COMFORT  LANCETS 30G MISC Check blood sugar 3 times daily-DX-E11.22  . Blood Glucose Calibration (ACCU-CHEK AVIVA) SOLN Check blood sugar 3 times daily-DX-E11.22  . blood glucose meter kit and supplies KIT Test blood sugar three times a day for Diabetes. Dx:E11.8/ Z79.4 .  Marland Kitchen blood glucose meter kit and supplies KIT Dispense based on patient and insurance preference. Use up to three times daily as directed. Dx:  . buPROPion (WELLBUTRIN XL) 150 MG 24 hr tablet Take 1 tablet every Morning for Mood , Focus & Concentration  . Cholecalciferol (VITAMIN D3) 2000 UNITS TABS Take 1 capsule by mouth 2 (two) times daily.  . COMFORT EZ PEN NEEDLES 32G X 4 MM MISC USE 2 TIMES DAILY AS DIRECTED , 1 PEN NEEDLE PER INJECTION.  Marland Kitchen Continuous Blood Gluc Sensor MISC Check blood sugar 3 times a day- Dx: E11.8/Z79.4  . dorzolamide-timolol (COSOPT) 22.3-6.8 MG/ML ophthalmic solution   . EASY COMFORT INSULIN SYRINGE 30G X 1/2" 0.5 ML MISC   . gabapentin (NEURONTIN)  300 MG capsule Take 1 capsule 3 to 4 x / day for Painful Diabetic Neuropathy  . hyoscyamine (LEVSIN SL) 0.125 MG SL tablet Take 1 to 2 tablets 3 to 4 x day if needed for Nausea, vomiting, cramping or diarrhea  . Insulin Isophane & Regular Human (NOVOLIN 70/30 FLEXPEN RELION) (70-30) 100 UNIT/ML PEN Inject 15-20 units subcutaneously with breakfast and dinner as directed for diabetes/high blood sugar.  . lactulose (CHRONULAC) 10 GM/15ML solution Take 15 ml (3 tsp) once or twice daily as needed for constipation.  Marland Kitchen latanoprost (XALATAN) 0.005 % ophthalmic solution   . LOTEMAX 0.5 % GEL   . Magnesium 400 MG TABS Take 1 tablet by mouth daily.  . Multiple Vitamins-Minerals (HAIR/SKIN/NAILS) TABS Take by mouth 2 (two) times daily.  Glory Rosebush VERIO test strip USE 1 STRIP TO CHECK GLUCOSE THREE TIMES DAILY AS DIRECTED  . rOPINIRole (REQUIP) 3 MG tablet Take 2 tablets   at Bedtime    for Restless Legs  . rosuvastatin (CRESTOR) 20 MG tablet TAKE 1 TABLET BY MOUTH ONCE DAILY FOR  CHOLESTEROL  . senna-docusate (SENOKOT-S) 8.6-50 MG tablet Take 1 tablet by mouth daily.  . vitamin B-12 (CYANOCOBALAMIN) 1000 MCG tablet Take 1,000 mcg by mouth daily.  . vitamin C (ASCORBIC ACID) 500 MG tablet Take 500 mg by mouth daily.  . [DISCONTINUED] bisoprolol-hydrochlorothiazide (ZIAC) 5-6.25 MG tablet Take 1 tablet Daily for BP   No current facility-administered medications on file prior to visit.   Patient Active Problem List   Diagnosis Date Noted  . Advanced nonexudative age-related macular degeneration of right eye with subfoveal involvement 11/18/2019  . Exudative age-related macular degeneration of left eye with inactive choroidal neovascularization ([place]) 11/18/2019  . 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 Cystoid macular edema of right eye 08/19/2019  . Exudative age-related macular degeneration of left eye ([place]) 08/19/2019  . Long term (current) use of insulin ([place]) 06/10/2019  . History of COVID-19 04/16/2019  . Depression, major, recurrent, in partial remission ([place]) 12/20/2018  . Type 2 diabetes mellitus with complication, with long-term current use of insulin ([place]) 02/09/2017  . Atherosclerosis of aorta ([place]) 11/06/2016  . Encounter for Medicare annual wellness exam 05/18/2015  . Medication management 08/06/2013  . Neuropathy due to type 2 diabetes mellitus ([place]) 06/04/2013  . CKD stage 3 due to type 2 diabetes mellitus ([place])   . Hyperlipidemia associated with type 2 diabetes mellitus ([place])   . Vitamin D deficiency   . Hypertension 07/29/2012  . Chronic pain 07/29/2012    Screening Tests  Immunization History  Administered Date(s) Administered  . DT (Pediatric) 01/04/2011  . Influenza Split 01/23/2013  . Influenza, High Dose Seasonal PF 02/27/2014, 03/11/2015, 12/21/2015, 01/30/2017, 02/08/2018, 01/02/2019  . PFIZER SARS-COV-2 Vaccination 07/22/2019, 08/13/2019  . Pneumococcal  Conjugate-13 02/27/2014  . Pneumococcal Polysaccharide-23 01/06/2012   Preventative care: Last colonoscopy: 2006 will not get another due to age [facility] 10/2018, has diagnostic scheduled at breast center due to R lump CXR 11/2015 DEXA 2007 declines Echo 2014  Lumbar 2018 MRA 2014  Tetanus 2012 Flu 2020 Pneumo 2013 Prevnar 13 2015 Shingles vaccine declines Covid 19: 2/2, 2021, pfizer  Names of Other Physician/Practitioners you currently use: 1. [facility] here for primary care 2. Dr[facility], eye doctor, last visit in 09/04/2019, no retinopathy, report abstracted,  3. Dr Sissy Hoff, DDS, dentist, last visit has scheduled 01/2020  Patient Care  Team: [name], MD as PCP - General (Internal Medicine) Warden Fillers, MD as Consulting Physician (Optometry) Zadie Rhine Clent Demark, MD as Consulting Physician (Ophthalmology) Glenna Fellows, MD as Attending Physician (Neurosurgery) Melrose Nakayama, MD as Consulting Physician (Orthopedic Surgery) Milus Banister, MD as Attending Physician (Gastroenterology)   Allergies Allergies  Allergen Reactions  . Morphine And Related Other (See Comments)    fuzzy feeling    SURGICAL HISTORY She  has a past surgical history that includes Abdominal hysterectomy; Cesarean section; Back surgery (1993); Appendectomy; Colonoscopy; Eye surgery; Trigger finger release (01/10/2012); and Breast excisional biopsy (Right, 1967). FAMILY HISTORY Her family history includes Diabetes in her sister; Liver disease in her sister. SOCIAL HISTORY She  reports that she has never smoked. She has never used smokeless tobacco. She reports that she does not drink alcohol and does not use drugs.  MEDICARE WELLNESS OBJECTIVES: Physical activity:   Cardiac risk factors:   Depression/mood screen:   Depression screen [facility] 2/9 09/02/2019  Decreased Interest 0  Down, Depressed, Hopeless -  PHQ - 2 Score 0  Altered sleeping -   Tired, decreased energy -  Change in appetite -  Feeling bad or failure about yourself  -  Trouble concentrating -  Moving slowly or fidgety/restless -  Suicidal thoughts -  PHQ-9 Score -  Difficult doing work/chores -  Some recent data might be hidden    ADLs:  In your present state of health, do you have any difficulty performing the following activities: 09/02/2019 02/02/2019  Hearing? N N  Vision? N N  Difficulty concentrating or making decisions? - N  Walking or climbing stairs? - N  Dressing or bathing? - N  Doing errands, shopping? - N  Some recent data might be hidden     Cognitive Testing  Alert? Yes  Normal Appearance?Yes  Oriented to person? Yes  Place? Yes   Time? Yes  Recall of three objects?  1/3   Can perform simple calculations? Yes  Displays appropriate judgment?Yes  Can read the correct time from a watch face?Yes  EOL planning:     Objective:     Blood pressure (!) 102/52, pulse (!) 56, temperature (!) 97.2 F (36.2 C), weight 125 lb (56.7 kg), SpO2 97 %.   General Appearance: Well nourished, alert, WD/WN, female and in no apparent distress. Eyes: PERRLA, EOMs, conjunctiva no swelling or erythema Sinuses: No frontal/maxillary tenderness ENT/Mouth: EACs patent / TMs  nl. Nares clear without erythema, swelling, mucoid exudates. Oral hygiene is good. No erythema, swelling, or exudate. Tongue normal, non-obstructing. Tonsils not swollen or erythematous. Hearing normal.  Neck: Supple, thyroid normal. No bruits, nodes or JVD. Respiratory: Respiratory effort normal.  BS equal and clear bilateral without rales, rhonci, wheezing or stridor. Cardio: Heart sounds are normal with regular rate and rhythm and no murmurs, rubs or gallops. Peripheral pulses are normal and equal bilaterally without edema. No aortic or femoral bruits. Chest: symmetric with normal excursions and percussion. Abdomen: Flat, soft  with nl bowel sounds. Nontender, no guarding, rebound,  hernias, masses, or organomegaly.  Lymphatics: Non tender without lymphadenopathy.  Musculoskeletal: Full ROM all peripheral extremities, joint stability, 5/5 strength, and normal gait. Skin: right flank with dry skin, excoriations, fine papules, no vesicles.  Neuro: Cranial nerves intact. DTR's UE  Nl/Equal and DTR's LE's absent.  Normal muscle tone, no cerebellar symptoms. Sensation decreased bilateral feet.  Pysch: Alert and oriented X 3, normal affect, Insight and Judgment are somewhat poor   Medicare Attestation  I have personally reviewed: The patient's medical and social history Their use of alcohol, tobacco or illicit drugs Their current medications and supplements The patient's functional ability including ADLs,fall risks, home safety risks, cognitive, and hearing and visual impairment Diet and physical activities Evidence for depression or mood disorders  The patient's weight, height, BMI, and visual acuity have been recorded in the chart.  I have made referrals, counseling, and provided education to the patient based on review of the above and I have provided the patient with a written personalized care plan for preventive services.  Over 40 minutes of exam, counseling, chart review was performed.  Izora Ribas, NP   12/23/2019

## 2019-12-23 ENCOUNTER — Other Ambulatory Visit: Payer: Self-pay

## 2019-12-23 ENCOUNTER — Encounter: Payer: Self-pay | Admitting: Adult Health

## 2019-12-23 ENCOUNTER — Ambulatory Visit (INDEPENDENT_AMBULATORY_CARE_PROVIDER_SITE_OTHER): Payer: Medicare Other | Admitting: Adult Health

## 2019-12-23 VITALS — BP 102/52 | HR 56 | Temp 97.2°F | Wt 125.0 lb

## 2019-12-23 DIAGNOSIS — I10 Essential (primary) hypertension: Secondary | ICD-10-CM

## 2019-12-23 DIAGNOSIS — Z79899 Other long term (current) drug therapy: Secondary | ICD-10-CM

## 2019-12-23 DIAGNOSIS — E1140 Type 2 diabetes mellitus with diabetic neuropathy, unspecified: Secondary | ICD-10-CM | POA: Diagnosis not present

## 2019-12-23 DIAGNOSIS — F3341 Major depressive disorder, recurrent, in partial remission: Secondary | ICD-10-CM

## 2019-12-23 DIAGNOSIS — E118 Type 2 diabetes mellitus with unspecified complications: Secondary | ICD-10-CM

## 2019-12-23 DIAGNOSIS — E1169 Type 2 diabetes mellitus with other specified complication: Secondary | ICD-10-CM

## 2019-12-23 DIAGNOSIS — R5383 Other fatigue: Secondary | ICD-10-CM

## 2019-12-23 DIAGNOSIS — I700 Atherosclerosis of aorta: Secondary | ICD-10-CM

## 2019-12-23 DIAGNOSIS — R6889 Other general symptoms and signs: Secondary | ICD-10-CM | POA: Diagnosis not present

## 2019-12-23 DIAGNOSIS — E559 Vitamin D deficiency, unspecified: Secondary | ICD-10-CM

## 2019-12-23 DIAGNOSIS — Z794 Long term (current) use of insulin: Secondary | ICD-10-CM

## 2019-12-23 DIAGNOSIS — E1122 Type 2 diabetes mellitus with diabetic chronic kidney disease: Secondary | ICD-10-CM | POA: Diagnosis not present

## 2019-12-23 DIAGNOSIS — N183 Chronic kidney disease, stage 3 unspecified: Secondary | ICD-10-CM

## 2019-12-23 DIAGNOSIS — H353211 Exudative age-related macular degeneration, right eye, with active choroidal neovascularization: Secondary | ICD-10-CM

## 2019-12-23 DIAGNOSIS — Z0001 Encounter for general adult medical examination with abnormal findings: Secondary | ICD-10-CM | POA: Diagnosis not present

## 2019-12-23 DIAGNOSIS — Z0000 Encounter for general adult medical examination without abnormal findings: Secondary | ICD-10-CM

## 2019-12-23 DIAGNOSIS — G894 Chronic pain syndrome: Secondary | ICD-10-CM

## 2019-12-23 DIAGNOSIS — H353222 Exudative age-related macular degeneration, left eye, with inactive choroidal neovascularization: Secondary | ICD-10-CM

## 2019-12-23 MED ORDER — BISOPROLOL-HYDROCHLOROTHIAZIDE 2.5-6.25 MG PO TABS
1.0000 | ORAL_TABLET | Freq: Every day | ORAL | 1 refills | Status: DC
Start: 2019-12-23 — End: 2020-01-06

## 2019-12-23 NOTE — Patient Instructions (Addendum)
    Please take 1/2 tab of blood pressure medication (ziac 5/6.25) - when you run out, will do lower dose 2.5/6.25 mg - 1 tab daily   Please start keeping a sugar/insulin and food log to bring with you to appointments      [name] , Thank you for taking time to come for your Medicare Wellness Visit. I appreciate your ongoing commitment to your health goals. Please review the following plan we discussed and let me know if I can assist you in the future.   These are the goals we discussed: Goals    . HEMOGLOBIN A1C < 8     Continue to keep track of your food and insulin Suggest checking your sugar BEFORE breakfast, luch & dinner and bring that in with you Continue to check your sugars regularly and  BRING IN YOUR LOG    Continue calling with reports of glucose if unusually low or high  STOP processed biscuits, sweets, white rice, crackers, fruit juice, cereal  A LOW SUGAR IS MORE DANGEROUS THAN A HIGH SUGAR    . LDL CALC < 70     Take rosuvastatin daily in the evening for cholesterol        This is a list of the screening recommended for you and due dates:  Health Maintenance  Topic Date Due  . Flu Shot  01/24/2020*  . Hemoglobin A1C  03/01/2020  . Complete foot exam   08/28/2020  . Urine Protein Check  08/29/2020  . Eye exam for diabetics  09/03/2020  . Tetanus Vaccine  01/03/2021  . DEXA scan (bone density measurement)  Completed  . COVID-19 Vaccine  Completed  . Pneumonia vaccines  Completed  *Topic was postponed. The date shown is not the original due date.

## 2019-12-25 LAB — CBC WITH DIFFERENTIAL/PLATELET
Absolute Monocytes: 819 cells/uL (ref 200–950)
Basophils Absolute: 73 cells/uL (ref 0–200)
Basophils Relative: 0.8 %
Eosinophils Absolute: 355 cells/uL (ref 15–500)
Eosinophils Relative: 3.9 %
HCT: 37.8 % (ref 35.0–45.0)
Hemoglobin: 12.8 g/dL (ref 11.7–15.5)
Lymphs Abs: 3413 cells/uL (ref 850–3900)
MCH: 32.5 pg (ref 27.0–33.0)
MCHC: 33.9 g/dL (ref 32.0–36.0)
MCV: 95.9 fL (ref 80.0–100.0)
MPV: 12.6 fL — ABNORMAL HIGH (ref 7.5–12.5)
Monocytes Relative: 9 %
Neutro Abs: 4441 cells/uL (ref 1500–7800)
Neutrophils Relative %: 48.8 %
Platelets: 203 10*3/uL (ref 140–400)
RBC: 3.94 10*6/uL (ref 3.80–5.10)
RDW: 11.6 % (ref 11.0–15.0)
Total Lymphocyte: 37.5 %
WBC: 9.1 10*3/uL (ref 3.8–10.8)

## 2019-12-25 LAB — COMPLETE METABOLIC PANEL WITH GFR
AG Ratio: 1.5 (calc) (ref 1.0–2.5)
ALT: 37 U/L — ABNORMAL HIGH (ref 6–29)
AST: 35 U/L (ref 10–35)
Albumin: 4.1 g/dL (ref 3.6–5.1)
Alkaline phosphatase (APISO): 110 U/L (ref 37–153)
BUN/Creatinine Ratio: 25 (calc) — ABNORMAL HIGH (ref 6–22)
BUN: 46 mg/dL — ABNORMAL HIGH (ref 7–25)
CO2: 30 mmol/L (ref 20–32)
Calcium: 9.7 mg/dL (ref 8.6–10.4)
Chloride: 101 mmol/L (ref 98–110)
Creat: 1.84 mg/dL — ABNORMAL HIGH (ref 0.60–0.88)
GFR, Est African American: 28 mL/min/{1.73_m2} — ABNORMAL LOW (ref >=60)
GFR, Est Non African American: 24 mL/min/{1.73_m2} — ABNORMAL LOW (ref >=60)
Globulin: 2.8 g/dL (calc) (ref 1.9–3.7)
Glucose, Bld: 146 mg/dL — ABNORMAL HIGH (ref 65–99)
Potassium: 4.5 mmol/L (ref 3.5–5.3)
Sodium: 140 mmol/L (ref 135–146)
Total Bilirubin: 0.4 mg/dL (ref 0.2–1.2)
Total Protein: 6.9 g/dL (ref 6.1–8.1)

## 2019-12-25 LAB — HEMOGLOBIN A1C: Hgb A1c MFr Bld: >14 % of total Hgb — ABNORMAL HIGH (ref <5.7)

## 2019-12-25 LAB — TSH: TSH: 1.6 mIU/L (ref 0.40–4.50)

## 2019-12-25 LAB — VITAMIN B12: Vitamin B-12: >2000 pg/mL — ABNORMAL HIGH (ref 200–1100)

## 2019-12-25 LAB — LIPID PANEL
Cholesterol: 168 mg/dL (ref <200)
HDL: 58 mg/dL (ref >=50)
LDL Cholesterol (Calc): 73 mg/dL (calc)
Non-HDL Cholesterol (Calc): 110 mg/dL (calc) (ref <130)
Total CHOL/HDL Ratio: 2.9 (calc) (ref <5.0)
Triglycerides: 280 mg/dL — ABNORMAL HIGH (ref <150)

## 2019-12-25 LAB — MAGNESIUM: Magnesium: 2.3 mg/dL (ref 1.5–2.5)

## 2019-12-26 ENCOUNTER — Other Ambulatory Visit: Payer: Self-pay

## 2019-12-26 ENCOUNTER — Other Ambulatory Visit: Payer: Medicare Other

## 2019-12-26 DIAGNOSIS — E118 Type 2 diabetes mellitus with unspecified complications: Secondary | ICD-10-CM

## 2019-12-26 DIAGNOSIS — Z794 Long term (current) use of insulin: Secondary | ICD-10-CM

## 2019-12-26 MED ORDER — FREESTYLE LIBRE SENSOR SYSTEM MISC: 0 refills | Status: DC

## 2019-12-31 ENCOUNTER — Other Ambulatory Visit: Payer: Self-pay | Admitting: Adult Health

## 2019-12-31 DIAGNOSIS — R413 Other amnesia: Secondary | ICD-10-CM

## 2019-12-31 DIAGNOSIS — Z9114 Patient's other noncompliance with medication regimen: Secondary | ICD-10-CM | POA: Insufficient documentation

## 2019-12-31 DIAGNOSIS — E1165 Type 2 diabetes mellitus with hyperglycemia: Secondary | ICD-10-CM | POA: Insufficient documentation

## 2019-12-31 DIAGNOSIS — Z794 Long term (current) use of insulin: Secondary | ICD-10-CM

## 2019-12-31 NOTE — Progress Notes (Signed)
86 y.o. patient with poorly controlled T2DM with CKD treated by insulin, novolin 70/30 to help simplify routine, with severely elevated A1C and suspect for poor health literacy, historically unreliable with glucose monitoring and compliance with insulin regimen. Also difficult to reach by phone, extremely concerned due to recent A1C of 14+ and unable to reach patient for several days. Discussed with Dr. Melford Aase, attempt to order continuous glucose monitoring device, and will coordinate home health nurse evaluation for med compliance, education and assistance with insulin management, evaluation of barriers. We will call back tomorrow to follow up on glucose monitoring and to give recommendations for insulin dose adjustment.  Scheduled for close follow up in office next week -   Future Appointments  Date Time Provider [place]  01/06/2020 11:00 AM Liane Comber, NP GAAM-GAAIM None  01/17/2020  1:00 PM GI-[facility]G DIAG TOMO 1 GI-BCGMM GI-BREAST CE  01/17/2020  1:10 PM GI-[facility]G [place] 1 GI-BCGUS GI-BREAST CE  02/18/2020 10:30 AM Rankin, Clent Demark, MD RDE-RDE None  03/25/2020 11:30 AM [name], MD GAAM-GAA[facility] None  09/09/2020 10:00 AM Vicie Mutters, PA-C GAAM-GAAIM None

## 2020-01-02 NOTE — Progress Notes (Signed)
Diabetes Education and Follow-Up Visit  86 y.o.female presents for diabetic education. She has Diabetes Mellitus type 2:  with diabetic chronic kidney disease and with diabetic neuropathy, unspecified, she is on bASA, and denies foot ulcerations, hyperglycemia, hypoglycemia , increased appetite, nausea, polydipsia, polyuria, visual disturbances, vomiting and weight loss.  Last hemoglobin A1c was: Lab Results  Component Value Date   HGBA1C >14.0 (H) 12/23/2019   HGBA1C 9.9 (H) 08/30/2019   HGBA1C 10.5 (H) 05/13/2019    Body mass index is 22.25 kg/m.  Pt is on a regimen of:  Novolin 70/30 pen 18-20 units BID  Pt checks her sugars 3 x day (fasting, before lunch, before dinner) -    She attributes elevations to her husband cooking more "he cooks what he likes to eat." She has demonstrated poor understanding/compliance with diet despite repeated education attempts in office. Discussed referral to nutritionist.   Lowest sugar she can recall was 59 (in the afternoon after skipping lunch) Highest sugar was 495 (checked right after eating).  Glucometer: One touch, freestyle Elenor Legato was ordered but patient declined, "don't want to pay for patch" - unsure how much it would cost but was too much   Diet: whatever husband cooks, trying to improve and cut down on sugars and starches, admits wasn't paying attention to this in recent months, but has in last 2 weeks, still admits "could do better" - eating toast, waffles, crackers  Exercise: minimal amount of walking right not due to weather   Patient does have CKD She is not on ACE/ARB - formerly on losartan, was d/c'd in 2017, unclear why, patient can't recall  Has been pushing fluid intake ~4 bottles daily   Lab Results  Component Value Date   GFRNONAA 24 (L) 12/23/2019   Lab Results  Component Value Date   CREATININE 1.84 (H) 12/23/2019   BUN 46 (H) 12/23/2019   NA 140 12/23/2019   K 4.5 12/23/2019   CL 101 12/23/2019   CO2 30 12/23/2019    Lab Results  Component Value Date   MICROALBUR 4.5 08/30/2019     She is on a Statin, taking rosuvastatin 20 mg daily  She is not at goal of less than 70.  Lab Results  Component Value Date   CHOL 168 12/23/2019   HDL 58 12/23/2019   LDLCALC 73 12/23/2019   TRIG 280 (H) 12/23/2019   CHOLHDL 2.9 12/23/2019      Problem List has Hypertension; Chronic pain; CKD stage 3 due to type 2 diabetes mellitus ([place]); Hyperlipidemia associated with type 2 diabetes mellitus ([place]); Vitamin D deficiency; Neuropathy due to type 2 diabetes mellitus ([place]); Medication management; Encounter for Medicare annual wellness exam; Atherosclerosis of aorta ([place]); Type 2 diabetes mellitus with complication, with long-term current use of insulin ([place]); Depression, major, recurrent, in partial remission ([place]); History of COVID-19; Long term (current) use of insulin ([place]); Exudative age-related macular degeneration of right eye with active choroidal neovascularization ([place]); Cystoid macular edema of right eye; Exudative age-related macular degeneration of left eye ([place]); Advanced nonexudative age-related macular degeneration of right eye with subfoveal involvement; Exudative age-related macular degeneration of left eye with inactive choroidal neovascularization ([place]); Memory changes; Poorly controlled type 2 diabetes mellitus ([place]); and Poor compliance with medication on their problem list.  Medications Current Outpatient Medications on File Prior to Visit  Medication Sig   aspirin 81 MG tablet Take 81 mg by mouth daily.   ASSURE COMFORT LANCETS 30G MISC Check blood sugar 3 times daily-DX-E11.22  bisoprolol-hydrochlorothiazide (ZIAC) 2.5-6.25 MG tablet Take 1 tablet by mouth daily.   Blood Glucose Calibration (ACCU-CHEK AVIVA) SOLN Check blood sugar 3 times daily-DX-E11.22   blood glucose meter kit and supplies KIT Test blood sugar three times a day for Diabetes. Dx:E11.8/ Z79.4 .   blood glucose meter kit  and supplies KIT Dispense based on patient and insurance preference. Use up to three times daily as directed. Dx:   buPROPion (WELLBUTRIN XL) 150 MG 24 hr tablet Take 1 tablet every Morning for Mood , Focus & Concentration   Cholecalciferol (VITAMIN D3) 2000 UNITS TABS Take 1 capsule by mouth 2 (two) times daily.   COMFORT EZ PEN NEEDLES 32G X 4 MM MISC USE 2 TIMES DAILY AS DIRECTED , 1 PEN NEEDLE PER INJECTION.   Continuous Blood Gluc Sensor (FREESTYLE LIBRE SENSOR SYSTEM) MISC Check blood sugar 3 times a day. Dx:E11.8, Z79.4   Continuous Blood Gluc Sensor MISC Check blood sugar 3 times a day- Dx: E11.8/Z79.4   dorzolamide-timolol (COSOPT) 22.3-6.8 MG/ML ophthalmic solution    EASY COMFORT INSULIN SYRINGE 30G X 1/2" 0.5 ML MISC    gabapentin (NEURONTIN) 300 MG capsule Take 1 capsule 3 to 4 x / day for Painful Diabetic Neuropathy   hyoscyamine (LEVSIN SL) 0.125 MG SL tablet Take 1 to 2 tablets 3 to 4 x day if needed for Nausea, vomiting, cramping or diarrhea   Insulin Isophane & Regular Human (NOVOLIN 70/30 FLEXPEN RELION) (70-30) 100 UNIT/ML PEN Inject 15-20 units subcutaneously with breakfast and dinner as directed for diabetes/high blood sugar.   lactulose (CHRONULAC) 10 GM/15ML solution Take 15 ml (3 tsp) once or twice daily as needed for constipation.   latanoprost (XALATAN) 0.005 % ophthalmic solution    LOTEMAX 0.5 % GEL    Magnesium 400 MG TABS Take 1 tablet by mouth daily.   Multiple Vitamins-Minerals (HAIR/SKIN/NAILS) TABS Take by mouth 2 (two) times daily.   ONETOUCH VERIO test strip USE 1 STRIP TO CHECK GLUCOSE THREE TIMES DAILY AS DIRECTED   rOPINIRole (REQUIP) 3 MG tablet Take 2 tablets   at Bedtime    for Restless Legs   rosuvastatin (CRESTOR) 20 MG tablet TAKE 1 TABLET BY MOUTH ONCE DAILY FOR CHOLESTEROL   senna-docusate (SENOKOT-S) 8.6-50 MG tablet Take 1 tablet by mouth daily.   vitamin B-12 (CYANOCOBALAMIN) 1000 MCG tablet Take 1,000 mcg by mouth daily.    vitamin C (ASCORBIC ACID) 500 MG tablet Take 500 mg by mouth daily.   No current facility-administered medications on file prior to visit.    ROS- see HPI  Physical Exam: Blood pressure (!) 104/58, pulse 62, temperature (!) 97 F (36.1 C), weight 125 lb 9.6 oz (57 kg), SpO2 98 %. Body mass index is 22.25 kg/m. General Appearance: Well nourished, in no apparent distress. Eyes: PERRLA, EOMs, conjunctiva no swelling or erythema ENT/Mouth: Ext aud canals clear, TMs without erythema, bulging. No erythema, swelling, or exudate on post pharynx.  Tonsils not swollen or erythematous. Hearing normal.  Respiratory: Respiratory effort normal, BS equal bilaterally without rales, rhonchi, wheezing or stridor.  Cardio: RRR with no MRGs. Brisk peripheral pulses without edema.  Abdomen: Soft, + BS.  Non tender, no guarding, rebound, hernias, masses. Musculoskeletal: Full ROM, 5/5 strength, normal gait.  Skin: Warm, dry without rashes, lesions, ecchymosis.  Neuro: Cranial nerves intact. Normal muscle tone, no cerebellar symptoms. Gross sensation intact   Plan and Assessment:  Diabetes Education: Reviewed ABCs of diabetes management (respective goals in parentheses):  A1C (<8),  blood pressure (<130/80), and cholesterol (LDL <70) Discussed benefit of ACE/AEB; formerly on and d/c'd, unclear why; she declines addition today Eye Exam yearly and Dental Exam every 6 months.- report requested Dietary recommendations Physical Activity recommendations - Continue with TID glucose - given sugar log and advised how to fill it and to bring it at next appt, follow up in 4 weeks - discussed insulin taper plan - insulin 15 units if low carb meal or having low sugars, 18 units standard, 20 units if 200+ - compliance with diet and logging emphasized - given instructions for hypoglycemia management - call office for any low sugars or 300+ - will refer for nutritional education with husband - referred for home  health review and pending due to poor compliance - check fructosamine, CMP/GFR, UA  Izora Ribas, NP 11:18 AM [facility]   Future Appointments  Date Time Provider [place]  01/08/2020  2:45 PM Rankin, Clent Demark, MD RDE-RDE None  01/17/2020  1:00 PM GI-BCG DIAG [place] 1 GI-BCGMM GI-BREAST CE  01/17/2020  1:10 PM GI-BCG [place] 1 GI-BCGUS GI-BREAST CE  03/25/2020 11:30 AM [name], MD GAAM-GAAIM None  09/09/2020 10:00 AM Garnet Sierras, NP GAAM-GAAIM None

## 2020-01-06 ENCOUNTER — Encounter: Payer: Self-pay | Admitting: Adult Health

## 2020-01-06 ENCOUNTER — Other Ambulatory Visit: Payer: Self-pay

## 2020-01-06 ENCOUNTER — Ambulatory Visit (INDEPENDENT_AMBULATORY_CARE_PROVIDER_SITE_OTHER): Payer: Medicare Other | Admitting: Adult Health

## 2020-01-06 VITALS — BP 104/58 | HR 62 | Temp 97.0°F | Wt 125.6 lb

## 2020-01-06 DIAGNOSIS — E1122 Type 2 diabetes mellitus with diabetic chronic kidney disease: Secondary | ICD-10-CM | POA: Diagnosis not present

## 2020-01-06 DIAGNOSIS — N184 Chronic kidney disease, stage 4 (severe): Secondary | ICD-10-CM

## 2020-01-06 DIAGNOSIS — Z9114 Patient's other noncompliance with medication regimen: Secondary | ICD-10-CM

## 2020-01-06 DIAGNOSIS — E118 Type 2 diabetes mellitus with unspecified complications: Secondary | ICD-10-CM | POA: Diagnosis not present

## 2020-01-06 DIAGNOSIS — R413 Other amnesia: Secondary | ICD-10-CM | POA: Diagnosis not present

## 2020-01-06 DIAGNOSIS — N183 Chronic kidney disease, stage 3 unspecified: Secondary | ICD-10-CM | POA: Diagnosis not present

## 2020-01-06 DIAGNOSIS — E1165 Type 2 diabetes mellitus with hyperglycemia: Secondary | ICD-10-CM | POA: Diagnosis not present

## 2020-01-06 DIAGNOSIS — N289 Disorder of kidney and ureter, unspecified: Secondary | ICD-10-CM

## 2020-01-06 DIAGNOSIS — R8290 Unspecified abnormal findings in urine: Secondary | ICD-10-CM

## 2020-01-06 DIAGNOSIS — Z794 Long term (current) use of insulin: Secondary | ICD-10-CM | POA: Diagnosis not present

## 2020-01-06 MED ORDER — LOSARTAN POTASSIUM 25 MG PO TABS
ORAL_TABLET | ORAL | 1 refills | Status: DC
Start: 2020-01-06 — End: 2020-09-03

## 2020-01-06 NOTE — Patient Instructions (Addendum)
Goals    . HEMOGLOBIN A1C < 8     Continue to keep track of your food and insulin Suggest checking your sugar BEFORE breakfast, luch & dinner and bring that in with you Continue to check your sugars regularly and  BRING IN YOUR LOG    Continue calling with reports of glucose if unusually low or high  STOP processed biscuits, sweets, white rice, crackers, fruit juice, cereal  A LOW SUGAR IS MORE DANGEROUS THAN A HIGH SUGAR    . LDL CALC < 70     Take rosuvastatin daily in the evening for cholesterol         Please make sure to check sugar before each meal - please call if any sugars < 85 or greater than 300 so we can make insulin adjustments  Take 18 units insulin in the morning - make sure not to skip lunch - at minimum eat a snack  Please try to keep a better log - pay attention to [facility] you might have had a low sugar or high sugar to try to avoid this next time   If 300 + in the morning, take 20 units, otherwise take 18 units If having frequent low sugars please reduce to 15 units  Please have a long conversation with your husband about your diabetes Absolutely ESSENTIAL that we do better with diet   If having more low sugars, please call the office -         Bad carbs also include fruit juice, alcohol, and sweet tea. These are empty calories that do not signal to your brain that you are full.   Please remember the good carbs are still carbs which convert into sugar. So please measure them out no more than 1/2-1 cup of rice, oatmeal, pasta, and beans  Veggies are however free foods! Pile them on.   Not all fruit is created equal. Please see the list below, the fruit at the bottom is higher in sugars than the fruit at the top. Please avoid all dried fruits.

## 2020-01-07 DIAGNOSIS — R8290 Unspecified abnormal findings in urine: Secondary | ICD-10-CM | POA: Diagnosis not present

## 2020-01-07 NOTE — Addendum Note (Signed)
Addended by: Izora Ribas on: 01/07/2020 08:04 AM   Modules accepted: Orders

## 2020-01-08 ENCOUNTER — Ambulatory Visit (INDEPENDENT_AMBULATORY_CARE_PROVIDER_SITE_OTHER): Payer: Medicare Other | Admitting: Ophthalmology

## 2020-01-08 ENCOUNTER — Encounter (INDEPENDENT_AMBULATORY_CARE_PROVIDER_SITE_OTHER): Payer: Self-pay | Admitting: Ophthalmology

## 2020-01-08 ENCOUNTER — Other Ambulatory Visit: Payer: Self-pay

## 2020-01-08 DIAGNOSIS — H353114 Nonexudative age-related macular degeneration, right eye, advanced atrophic with subfoveal involvement: Secondary | ICD-10-CM

## 2020-01-08 DIAGNOSIS — H353124 Nonexudative age-related macular degeneration, left eye, advanced atrophic with subfoveal involvement: Secondary | ICD-10-CM

## 2020-01-08 DIAGNOSIS — H353211 Exudative age-related macular degeneration, right eye, with active choroidal neovascularization: Secondary | ICD-10-CM

## 2020-01-08 NOTE — Assessment & Plan Note (Signed)

## 2020-01-08 NOTE — Progress Notes (Signed)
01/08/2020     CHIEF COMPLAINT Patient presents for Retina Follow Up   HISTORY OF PRESENT ILLNESS: [name] is a 86 y.o. female who presents to the clinic today for:   HPI    Retina Follow Up    Patient presents with  Wet AMD.  In right eye.  This started 7 weeks ago.  Severity is mild.  Duration of 7 weeks.  Since onset it is gradually worsening.          Comments    AMD F/U OD - pt early F/U due to VA changes OD  Pt c/o occasional difficulty opening eye all the way in the mornings OD. Pt sts, "the eye is open but it doesn't feel like it." Pt sts OD seems to be getting blurry. VA OS stable.       Last edited by Rockie Neighbours, [place] on 01/08/2020  2:47 PM. (History)      Referring physician: [name], MD [address] [place],  [place] [address]  HISTORICAL INFORMATION:   Selected notes from the MEDICAL RECORD NUMBER    Lab Results  Component Value Date   HGBA1C >14.0 (H) 12/23/2019     CURRENT MEDICATIONS: Current Outpatient Medications (Ophthalmic Drugs)  Medication Sig  . dorzolamide-timolol (COSOPT) 22.3-6.8 MG/ML ophthalmic solution   . latanoprost (XALATAN) 0.005 % ophthalmic solution   . LOTEMAX 0.5 % GEL    No current facility-administered medications for this visit. (Ophthalmic Drugs)   Current Outpatient Medications (Other)  Medication Sig  . aspirin 81 MG tablet Take 81 mg by mouth daily.  . ASSURE COMFORT LANCETS 30G MISC Check blood sugar 3 times daily-DX-E11.22  . Blood Glucose Calibration (ACCU-CHEK AVIVA) SOLN Check blood sugar 3 times daily-DX-E11.22  . blood glucose meter kit and supplies KIT Test blood sugar three times a day for Diabetes. Dx:E11.8/ Z79.4 .  Marland Kitchen blood glucose meter kit and supplies KIT Dispense based on patient and insurance preference. Use up to three times daily as directed. Dx:  . buPROPion (WELLBUTRIN XL) 150 MG 24 hr tablet Take 1 tablet every Morning for Mood , Focus & Concentration  .  Cholecalciferol (VITAMIN D3) 2000 UNITS TABS Take 1 capsule by mouth 2 (two) times daily.  . COMFORT EZ PEN NEEDLES 32G X 4 MM MISC USE 2 TIMES DAILY AS DIRECTED , 1 PEN NEEDLE PER INJECTION.  Marland Kitchen Continuous Blood Gluc Sensor (FREESTYLE LIBRE SENSOR SYSTEM) MISC Check blood sugar 3 times a day. Dx:E11.8, Z79.4  . Continuous Blood Gluc Sensor MISC Check blood sugar 3 times a day- Dx: E11.8/Z79.4  . EASY COMFORT INSULIN SYRINGE 30G X 1/2" 0.5 ML MISC   . gabapentin (NEURONTIN) 300 MG capsule Take 1 capsule 3 to 4 x / day for Painful Diabetic Neuropathy  . hyoscyamine (LEVSIN SL) 0.125 MG SL tablet Take 1 to 2 tablets 3 to 4 x day if needed for Nausea, vomiting, cramping or diarrhea  . Insulin Isophane & Regular Human (NOVOLIN 70/30 FLEXPEN RELION) (70-30) 100 UNIT/ML PEN Inject 15-20 units subcutaneously with breakfast and dinner as directed for diabetes/high blood sugar.  . lactulose (CHRONULAC) 10 GM/15ML solution Take 15 ml (3 tsp) once or twice daily as needed for constipation.  Marland Kitchen losartan (COZAAR) 25 MG tablet Take 1 tab daily for blood pressure and kidney protection.  . Magnesium 400 MG TABS Take 1 tablet by mouth daily.  . Multiple Vitamins-Minerals (HAIR/SKIN/NAILS) TABS Take by mouth 2 (two) times daily.  Marland Kitchen  ONETOUCH VERIO test strip USE 1 STRIP TO CHECK GLUCOSE THREE TIMES DAILY AS DIRECTED  . rOPINIRole (REQUIP) 3 MG tablet Take 2 tablets   at Bedtime    for Restless Legs  . rosuvastatin (CRESTOR) 20 MG tablet TAKE 1 TABLET BY MOUTH ONCE DAILY FOR CHOLESTEROL  . senna-docusate (SENOKOT-S) 8.6-50 MG tablet Take 1 tablet by mouth daily.  . vitamin B-12 (CYANOCOBALAMIN) 1000 MCG tablet Take 1,000 mcg by mouth daily.  . vitamin C (ASCORBIC ACID) 500 MG tablet Take 500 mg by mouth daily.   No current facility-administered medications for this visit. (Other)      REVIEW OF SYSTEMS:    ALLERGIES Allergies  Allergen Reactions  . Morphine And Related Other (See Comments)    fuzzy feeling     PAST MEDICAL HISTORY Past Medical History:  Diagnosis Date  . Arthritis   . Chronic back pain   . Diabetic neuropathy ([place])   . Hypercholesteremia   . Hypertension   . Liver lesion   . Neuropathy   . Type II or unspecified type diabetes mellitus without mention of complication, not stated as uncontrolled   . Vitamin D deficiency   . Wears glasses    Past Surgical History:  Procedure Laterality Date  . ABDOMINAL HYSTERECTOMY    . APPENDECTOMY    . BACK SURGERY  1993   lumb lam-fusion  . BREAST EXCISIONAL BIOPSY Right 1967   benign  . CESAREAN SECTION     x 3   . COLONOSCOPY    . EYE SURGERY     both cataracts  . TRIGGER FINGER RELEASE  01/10/2012   Procedure: RELEASE TRIGGER FINGER/A-1 PULLEY;  Surgeon: Hessie Dibble, MD;  Location: [facility];  Service: Orthopedics;  Laterality: Right;  right ring trigger release    FAMILY HISTORY Family History  Problem Relation Age of Onset  . Liver disease Sister   . Diabetes Sister   . Colon cancer Neg Hx     SOCIAL HISTORY Social History   Tobacco Use  . Smoking status: Never Smoker  . Smokeless tobacco: Never Used  Substance Use Topics  . Alcohol use: No  . Drug use: No         OPHTHALMIC EXAM:  Base Eye Exam    Visual Acuity (ETDRS)      Right Left   Dist cc 20/100 +2 20/50   Dist ph cc NI NI   Correction: Glasses       Tonometry (Tonopen, 2:50 PM)      Right Left   Pressure 14 13       Pupils      Pupils Dark Light Shape React APD   Right PERRL 2 2 Round Minimal None   Left PERRL 2 2 Round Minimal None       Visual Fields (Counting fingers)      Left Right    Full Full       Extraocular Movement      Right Left    Full Full       Neuro/Psych    Oriented x3: Yes   Mood/Affect: Normal       Dilation    Both eyes: 1.0% Mydriacyl, 2.5% Phenylephrine @ 2:53 PM        Slit Lamp and Fundus Exam    External Exam      Right Left   External Normal Normal        Slit Lamp Exam  Right Left   Lids/Lashes Normal Normal   Conjunctiva/Sclera White and quiet White and quiet   Cornea Clear Clear   Anterior Chamber Deep and quiet Deep and quiet   Iris Round and reactive Round and reactive   Lens Posterior chamber intraocular lens Posterior chamber intraocular lens   Anterior Vitreous Normal Normal       Fundus Exam      Right Left   Posterior Vitreous Posterior vitreous detachment Posterior vitreous detachment   Disc Normal Normal   C/D Ratio 0.4 0.45   Macula Geographic atrophy 3 DA size, Advanced age related macular degeneration, no macular thickening, Retinal pigment epithelial mottling, no exudates Geographic atrophy 2 DA size, Advanced age related macular degeneration, no macular thickening, Retinal pigment epithelial mottling   Vessels Normal Normal   Periphery Normal Normal          IMAGING AND PROCEDURES  Imaging and Procedures for 01/08/20  OCT, Retina - OU - Both Eyes       Right Eye Quality was good. Scan locations included subfoveal. Central Foveal Thickness: 237. Progression has improved. Findings include abnormal foveal contour, central retinal atrophy, no SRF, no IRF, retinal drusen .   Left Eye Quality was good. Scan locations included subfoveal. Central Foveal Thickness: 234. Progression has been stable. Findings include abnormal foveal contour, central retinal atrophy, subretinal scarring, no SRF, no IRF, retinal drusen .   Notes Geographic atrophy OU in the macular accounts for visual acuity, no signs of active CNVM at the margins of the current areas of atrophy       Color Fundus Photography Optos - OU - Both Eyes       Right Eye Progression has been stable. Macula : geographic atrophy, drusen. Vessels : normal observations. Periphery : normal observations.   Left Eye Progression has been stable. Macula : drusen, geographic atrophy. Vessels : normal observations. Periphery : normal observations.    Notes Geographic atrophy centrally each eye with no signs of active CNVM either eye.                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 degeneration of left eye with subfoveal involvement The nature of dry age related macular degeneration was discussed with the patient as well as its possible conversion to wet. The results of the AREDS 2 study was discussed with the patient. A diet rich in dark leafy green vegetables was advised and specific recommendations were made regarding supplements with AREDS 2 formulation . Control of hypertension and serum cholesterol may slow the disease. Smoking cessation is mandatory to slow the disease and diminish the risk of progressing to wet age related macular degeneration. The patient was instructed in the use of an [place] and was told to return immediately for any changes in the Grid. Stressed to the patient do not rub eyes  Advanced nonexudative age-related macular degeneration of right eye with subfoveal involvement The nature of dry age related macular degeneration was discussed with the patient as well as its possible conversion to wet. The results of the AREDS 2 study was discussed with the patient. A diet rich in dark leafy green vegetables was advised and specific recommendations were made regarding supplements with AREDS 2 formulation . Control of hypertension and serum cholesterol may slow the disease. Smoking cessation is mandatory to slow the disease and diminish the risk of progressing to wet age related macular degeneration. The patient was instructed in the use of an [place] and was  told to return immediately for any changes in the Grid. Stressed to the patient do not rub eyes      ICD-10-CM   1. Exudative age-related macular degeneration of right eye with active choroidal neovascularization (HCC)  H35.3211 OCT, Retina - OU - Both Eyes    Color Fundus Photography Optos - OU - Both Eyes  2. Advanced  nonexudative age-related macular degeneration of left eye with subfoveal involvement  H35.3124   3. Advanced nonexudative age-related macular degeneration of right eye with subfoveal involvement  H35.3114     1.  No signs of active CNVM OU, will continue to monitor OU and follow-up in 8 weeks  2.  3.  Ophthalmic Meds Ordered this visit:  No orders of the defined types were placed in this encounter.      Return in about 8 weeks (around 03/04/2020) for DILATE OU, OCT.  Patient Instructions  Report new onset visual acuity distortions or declines    Explained the diagnoses, plan, and follow up with the patient and they expressed understanding.  Patient expressed understanding of the importance of proper follow up care.   Clent Demark Prestin Munch M.D. Diseases & Surgery of the Retina and Vitreous Retina [facility] Diabetic [place] 01/08/20     Abbreviations: M myopia (nearsighted); A astigmatism; H hyperopia (farsighted); P presbyopia; Mrx spectacle prescription;  CTL contact lenses; OD right eye; OS left eye; OU both eyes  XT exotropia; ET esotropia; PEK punctate epithelial keratitis; PEE punctate epithelial erosions; DES dry eye syndrome; MGD meibomian gland dysfunction; ATs artificial tears; PFAT's preservative free artificial tears; [place] nuclear sclerotic cataract; PSC posterior subcapsular cataract; ERM epi-retinal membrane; PVD posterior vitreous detachment; RD retinal detachment; DM diabetes mellitus; DR diabetic retinopathy; NPDR non-proliferative diabetic retinopathy; PDR proliferative diabetic retinopathy; CSME clinically significant macular edema; DME diabetic macular edema; dbh dot blot hemorrhages; CWS cotton wool spot; POAG primary open angle glaucoma; C/D cup-to-disc ratio; HVF humphrey visual field; GVF goldmann visual field; OCT optical coherence tomography; IOP intraocular pressure; BRVO Branch retinal vein occlusion; CRVO central retinal vein occlusion; CRAO central retinal artery  occlusion; BRAO branch retinal artery occlusion; RT retinal tear; SB scleral buckle; PPV pars plana vitrectomy; VH Vitreous hemorrhage; PRP panretinal laser photocoagulation; IVK intravitreal kenalog; VMT vitreomacular traction; MH Macular hole;  NVD neovascularization of the disc; NVE neovascularization elsewhere; AREDS age related eye disease study; ARMD age related macular degeneration; POAG primary open angle glaucoma; EBMD epithelial/anterior basement membrane dystrophy; ACIOL anterior chamber intraocular lens; IOL intraocular lens; PCIOL posterior chamber intraocular lens; Phaco/IOL phacoemulsification with intraocular lens placement; [place] photorefractive keratectomy; LASIK laser assisted in situ keratomileusis; HTN hypertension; DM diabetes mellitus; COPD chronic obstructive pulmonary disease

## 2020-01-08 NOTE — Patient Instructions (Signed)
Report new onset visual acuity distortions or declines 

## 2020-01-09 LAB — URINALYSIS, ROUTINE W REFLEX MICROSCOPIC
Bilirubin Urine: NEGATIVE
Glucose, UA: NEGATIVE
Hgb urine dipstick: NEGATIVE
Hyaline Cast: NONE SEEN /LPF
Ketones, ur: NEGATIVE
Nitrite: POSITIVE — AB
Protein, ur: NEGATIVE
RBC / HPF: NONE SEEN /HPF (ref 0–2)
Specific Gravity, Urine: 1.015 (ref 1.001–1.03)
WBC, UA: >=60 /HPF — AB (ref 0–5)
pH: 5.5 (ref 5.0–8.0)

## 2020-01-09 LAB — BASIC METABOLIC PANEL WITH GFR
BUN/Creatinine Ratio: 19 (calc) (ref 6–22)
BUN: 38 mg/dL — ABNORMAL HIGH (ref 7–25)
CO2: 29 mmol/L (ref 20–32)
Calcium: 9.7 mg/dL (ref 8.6–10.4)
Chloride: 103 mmol/L (ref 98–110)
Creat: 2.02 mg/dL — ABNORMAL HIGH (ref 0.60–0.88)
GFR, Est African American: 25 mL/min/{1.73_m2} — ABNORMAL LOW (ref >=60)
GFR, Est Non African American: 22 mL/min/{1.73_m2} — ABNORMAL LOW (ref >=60)
Glucose, Bld: 160 mg/dL — ABNORMAL HIGH (ref 65–99)
Potassium: 5.1 mmol/L (ref 3.5–5.3)
Sodium: 141 mmol/L (ref 135–146)

## 2020-01-09 LAB — FRUCTOSAMINE: Fructosamine: 567 umol/L — ABNORMAL HIGH (ref 205–285)

## 2020-01-10 ENCOUNTER — Other Ambulatory Visit: Payer: Self-pay | Admitting: Adult Health

## 2020-01-10 LAB — URINE CULTURE
MICRO NUMBER:: 10948850
SPECIMEN QUALITY:: ADEQUATE

## 2020-01-10 MED ORDER — CEPHALEXIN 250 MG PO CAPS: 250.0000 mg | ORAL_CAPSULE | Freq: Two times a day (BID) | ORAL | 0 refills | Status: AC

## 2020-01-11 DIAGNOSIS — E1140 Type 2 diabetes mellitus with diabetic neuropathy, unspecified: Secondary | ICD-10-CM | POA: Diagnosis not present

## 2020-01-11 DIAGNOSIS — H353211 Exudative age-related macular degeneration, right eye, with active choroidal neovascularization: Secondary | ICD-10-CM | POA: Diagnosis not present

## 2020-01-11 DIAGNOSIS — I10 Essential (primary) hypertension: Secondary | ICD-10-CM | POA: Diagnosis not present

## 2020-01-11 DIAGNOSIS — E1122 Type 2 diabetes mellitus with diabetic chronic kidney disease: Secondary | ICD-10-CM | POA: Diagnosis not present

## 2020-01-11 DIAGNOSIS — E782 Mixed hyperlipidemia: Secondary | ICD-10-CM | POA: Diagnosis not present

## 2020-01-11 DIAGNOSIS — G894 Chronic pain syndrome: Secondary | ICD-10-CM | POA: Diagnosis not present

## 2020-01-11 DIAGNOSIS — R413 Other amnesia: Secondary | ICD-10-CM | POA: Diagnosis not present

## 2020-01-11 DIAGNOSIS — G2581 Restless legs syndrome: Secondary | ICD-10-CM | POA: Diagnosis not present

## 2020-01-11 DIAGNOSIS — H353222 Exudative age-related macular degeneration, left eye, with inactive choroidal neovascularization: Secondary | ICD-10-CM | POA: Diagnosis not present

## 2020-01-11 DIAGNOSIS — I700 Atherosclerosis of aorta: Secondary | ICD-10-CM | POA: Diagnosis not present

## 2020-01-11 DIAGNOSIS — N1832 Chronic kidney disease, stage 3b: Secondary | ICD-10-CM | POA: Diagnosis not present

## 2020-01-11 DIAGNOSIS — E1165 Type 2 diabetes mellitus with hyperglycemia: Secondary | ICD-10-CM | POA: Diagnosis not present

## 2020-01-17 ENCOUNTER — Other Ambulatory Visit: Payer: Self-pay | Admitting: Internal Medicine

## 2020-01-17 ENCOUNTER — Ambulatory Visit
Admission: RE | Admit: 2020-01-17 | Discharge: 2020-01-17 | Disposition: A | Discharge status: Home / Self Care | Payer: Medicare Other | Source: Ambulatory Visit | Attending: Internal Medicine | Admitting: Internal Medicine

## 2020-01-17 ENCOUNTER — Other Ambulatory Visit: Payer: Self-pay

## 2020-01-17 DIAGNOSIS — N63 Unspecified lump in unspecified breast: Secondary | ICD-10-CM

## 2020-01-17 DIAGNOSIS — N6489 Other specified disorders of breast: Secondary | ICD-10-CM | POA: Diagnosis not present

## 2020-01-17 DIAGNOSIS — R922 Inconclusive mammogram: Secondary | ICD-10-CM | POA: Diagnosis not present

## 2020-01-20 ENCOUNTER — Ambulatory Visit: Payer: Medicare Other | Admitting: Adult Health

## 2020-01-20 DIAGNOSIS — E1140 Type 2 diabetes mellitus with diabetic neuropathy, unspecified: Secondary | ICD-10-CM | POA: Diagnosis not present

## 2020-01-20 DIAGNOSIS — H353211 Exudative age-related macular degeneration, right eye, with active choroidal neovascularization: Secondary | ICD-10-CM | POA: Diagnosis not present

## 2020-01-20 DIAGNOSIS — G2581 Restless legs syndrome: Secondary | ICD-10-CM | POA: Diagnosis not present

## 2020-01-20 DIAGNOSIS — G894 Chronic pain syndrome: Secondary | ICD-10-CM | POA: Diagnosis not present

## 2020-01-20 DIAGNOSIS — I700 Atherosclerosis of aorta: Secondary | ICD-10-CM | POA: Diagnosis not present

## 2020-01-20 DIAGNOSIS — E1122 Type 2 diabetes mellitus with diabetic chronic kidney disease: Secondary | ICD-10-CM | POA: Diagnosis not present

## 2020-01-20 DIAGNOSIS — R413 Other amnesia: Secondary | ICD-10-CM | POA: Diagnosis not present

## 2020-01-20 DIAGNOSIS — I10 Essential (primary) hypertension: Secondary | ICD-10-CM | POA: Diagnosis not present

## 2020-01-20 DIAGNOSIS — E1165 Type 2 diabetes mellitus with hyperglycemia: Secondary | ICD-10-CM | POA: Diagnosis not present

## 2020-01-20 DIAGNOSIS — H353222 Exudative age-related macular degeneration, left eye, with inactive choroidal neovascularization: Secondary | ICD-10-CM | POA: Diagnosis not present

## 2020-01-20 DIAGNOSIS — N1832 Chronic kidney disease, stage 3b: Secondary | ICD-10-CM | POA: Diagnosis not present

## 2020-01-20 DIAGNOSIS — E782 Mixed hyperlipidemia: Secondary | ICD-10-CM | POA: Diagnosis not present

## 2020-01-21 DIAGNOSIS — N1832 Chronic kidney disease, stage 3b: Secondary | ICD-10-CM | POA: Diagnosis not present

## 2020-01-21 DIAGNOSIS — E1165 Type 2 diabetes mellitus with hyperglycemia: Secondary | ICD-10-CM | POA: Diagnosis not present

## 2020-01-21 DIAGNOSIS — I700 Atherosclerosis of aorta: Secondary | ICD-10-CM | POA: Diagnosis not present

## 2020-01-21 DIAGNOSIS — I10 Essential (primary) hypertension: Secondary | ICD-10-CM | POA: Diagnosis not present

## 2020-01-21 DIAGNOSIS — H353222 Exudative age-related macular degeneration, left eye, with inactive choroidal neovascularization: Secondary | ICD-10-CM | POA: Diagnosis not present

## 2020-01-21 DIAGNOSIS — E1140 Type 2 diabetes mellitus with diabetic neuropathy, unspecified: Secondary | ICD-10-CM | POA: Diagnosis not present

## 2020-01-21 DIAGNOSIS — G894 Chronic pain syndrome: Secondary | ICD-10-CM | POA: Diagnosis not present

## 2020-01-21 DIAGNOSIS — E782 Mixed hyperlipidemia: Secondary | ICD-10-CM | POA: Diagnosis not present

## 2020-01-21 DIAGNOSIS — E1122 Type 2 diabetes mellitus with diabetic chronic kidney disease: Secondary | ICD-10-CM | POA: Diagnosis not present

## 2020-01-21 DIAGNOSIS — G2581 Restless legs syndrome: Secondary | ICD-10-CM | POA: Diagnosis not present

## 2020-01-21 DIAGNOSIS — R413 Other amnesia: Secondary | ICD-10-CM | POA: Diagnosis not present

## 2020-01-21 DIAGNOSIS — H353211 Exudative age-related macular degeneration, right eye, with active choroidal neovascularization: Secondary | ICD-10-CM | POA: Diagnosis not present

## 2020-01-23 DIAGNOSIS — G2581 Restless legs syndrome: Secondary | ICD-10-CM | POA: Diagnosis not present

## 2020-01-23 DIAGNOSIS — H353222 Exudative age-related macular degeneration, left eye, with inactive choroidal neovascularization: Secondary | ICD-10-CM | POA: Diagnosis not present

## 2020-01-23 DIAGNOSIS — H353211 Exudative age-related macular degeneration, right eye, with active choroidal neovascularization: Secondary | ICD-10-CM | POA: Diagnosis not present

## 2020-01-23 DIAGNOSIS — E782 Mixed hyperlipidemia: Secondary | ICD-10-CM | POA: Diagnosis not present

## 2020-01-23 DIAGNOSIS — R413 Other amnesia: Secondary | ICD-10-CM | POA: Diagnosis not present

## 2020-01-23 DIAGNOSIS — G894 Chronic pain syndrome: Secondary | ICD-10-CM | POA: Diagnosis not present

## 2020-01-23 DIAGNOSIS — I10 Essential (primary) hypertension: Secondary | ICD-10-CM | POA: Diagnosis not present

## 2020-01-23 DIAGNOSIS — E1122 Type 2 diabetes mellitus with diabetic chronic kidney disease: Secondary | ICD-10-CM | POA: Diagnosis not present

## 2020-01-23 DIAGNOSIS — N1832 Chronic kidney disease, stage 3b: Secondary | ICD-10-CM | POA: Diagnosis not present

## 2020-01-23 DIAGNOSIS — I700 Atherosclerosis of aorta: Secondary | ICD-10-CM | POA: Diagnosis not present

## 2020-01-23 DIAGNOSIS — E1140 Type 2 diabetes mellitus with diabetic neuropathy, unspecified: Secondary | ICD-10-CM | POA: Diagnosis not present

## 2020-01-23 DIAGNOSIS — E1165 Type 2 diabetes mellitus with hyperglycemia: Secondary | ICD-10-CM | POA: Diagnosis not present

## 2020-01-24 DIAGNOSIS — I10 Essential (primary) hypertension: Secondary | ICD-10-CM | POA: Diagnosis not present

## 2020-01-24 DIAGNOSIS — N1832 Chronic kidney disease, stage 3b: Secondary | ICD-10-CM | POA: Diagnosis not present

## 2020-01-24 DIAGNOSIS — H353222 Exudative age-related macular degeneration, left eye, with inactive choroidal neovascularization: Secondary | ICD-10-CM | POA: Diagnosis not present

## 2020-01-24 DIAGNOSIS — R413 Other amnesia: Secondary | ICD-10-CM | POA: Diagnosis not present

## 2020-01-24 DIAGNOSIS — E1140 Type 2 diabetes mellitus with diabetic neuropathy, unspecified: Secondary | ICD-10-CM | POA: Diagnosis not present

## 2020-01-24 DIAGNOSIS — G894 Chronic pain syndrome: Secondary | ICD-10-CM | POA: Diagnosis not present

## 2020-01-24 DIAGNOSIS — E1122 Type 2 diabetes mellitus with diabetic chronic kidney disease: Secondary | ICD-10-CM | POA: Diagnosis not present

## 2020-01-24 DIAGNOSIS — I700 Atherosclerosis of aorta: Secondary | ICD-10-CM | POA: Diagnosis not present

## 2020-01-24 DIAGNOSIS — E1165 Type 2 diabetes mellitus with hyperglycemia: Secondary | ICD-10-CM | POA: Diagnosis not present

## 2020-01-24 DIAGNOSIS — H353211 Exudative age-related macular degeneration, right eye, with active choroidal neovascularization: Secondary | ICD-10-CM | POA: Diagnosis not present

## 2020-01-24 DIAGNOSIS — G2581 Restless legs syndrome: Secondary | ICD-10-CM | POA: Diagnosis not present

## 2020-01-24 DIAGNOSIS — E782 Mixed hyperlipidemia: Secondary | ICD-10-CM | POA: Diagnosis not present

## 2020-01-27 ENCOUNTER — Telehealth: Payer: Self-pay | Admitting: Internal Medicine

## 2020-01-27 DIAGNOSIS — M545 Low back pain, unspecified: Secondary | ICD-10-CM | POA: Diagnosis not present

## 2020-01-27 NOTE — Telephone Encounter (Signed)
[place] called for verbal for Physical & Speech therapy, 1x1wk, 2x2wk. Per providerer okay.

## 2020-01-28 ENCOUNTER — Other Ambulatory Visit: Payer: Self-pay | Admitting: Orthopaedic Surgery

## 2020-01-28 DIAGNOSIS — N390 Urinary tract infection, site not specified: Secondary | ICD-10-CM | POA: Diagnosis not present

## 2020-01-28 DIAGNOSIS — Z23 Encounter for immunization: Secondary | ICD-10-CM | POA: Diagnosis not present

## 2020-01-28 DIAGNOSIS — N184 Chronic kidney disease, stage 4 (severe): Secondary | ICD-10-CM | POA: Diagnosis not present

## 2020-01-28 DIAGNOSIS — E1122 Type 2 diabetes mellitus with diabetic chronic kidney disease: Secondary | ICD-10-CM | POA: Diagnosis not present

## 2020-01-28 DIAGNOSIS — M47816 Spondylosis without myelopathy or radiculopathy, lumbar region: Secondary | ICD-10-CM

## 2020-01-28 DIAGNOSIS — I129 Hypertensive chronic kidney disease with stage 1 through stage 4 chronic kidney disease, or unspecified chronic kidney disease: Secondary | ICD-10-CM | POA: Diagnosis not present

## 2020-01-28 DIAGNOSIS — N183 Chronic kidney disease, stage 3 unspecified: Secondary | ICD-10-CM | POA: Diagnosis not present

## 2020-01-29 DIAGNOSIS — E782 Mixed hyperlipidemia: Secondary | ICD-10-CM | POA: Diagnosis not present

## 2020-01-29 DIAGNOSIS — E1140 Type 2 diabetes mellitus with diabetic neuropathy, unspecified: Secondary | ICD-10-CM | POA: Diagnosis not present

## 2020-01-29 DIAGNOSIS — H353222 Exudative age-related macular degeneration, left eye, with inactive choroidal neovascularization: Secondary | ICD-10-CM | POA: Diagnosis not present

## 2020-01-29 DIAGNOSIS — I10 Essential (primary) hypertension: Secondary | ICD-10-CM | POA: Diagnosis not present

## 2020-01-29 DIAGNOSIS — E1122 Type 2 diabetes mellitus with diabetic chronic kidney disease: Secondary | ICD-10-CM | POA: Diagnosis not present

## 2020-01-29 DIAGNOSIS — R413 Other amnesia: Secondary | ICD-10-CM | POA: Diagnosis not present

## 2020-01-29 DIAGNOSIS — G2581 Restless legs syndrome: Secondary | ICD-10-CM | POA: Diagnosis not present

## 2020-01-29 DIAGNOSIS — I700 Atherosclerosis of aorta: Secondary | ICD-10-CM | POA: Diagnosis not present

## 2020-01-29 DIAGNOSIS — N1832 Chronic kidney disease, stage 3b: Secondary | ICD-10-CM | POA: Diagnosis not present

## 2020-01-29 DIAGNOSIS — E1165 Type 2 diabetes mellitus with hyperglycemia: Secondary | ICD-10-CM | POA: Diagnosis not present

## 2020-01-29 DIAGNOSIS — G894 Chronic pain syndrome: Secondary | ICD-10-CM | POA: Diagnosis not present

## 2020-01-29 DIAGNOSIS — H353211 Exudative age-related macular degeneration, right eye, with active choroidal neovascularization: Secondary | ICD-10-CM | POA: Diagnosis not present

## 2020-01-30 DIAGNOSIS — E782 Mixed hyperlipidemia: Secondary | ICD-10-CM | POA: Diagnosis not present

## 2020-01-30 DIAGNOSIS — I10 Essential (primary) hypertension: Secondary | ICD-10-CM | POA: Diagnosis not present

## 2020-01-30 DIAGNOSIS — G894 Chronic pain syndrome: Secondary | ICD-10-CM | POA: Diagnosis not present

## 2020-01-30 DIAGNOSIS — G2581 Restless legs syndrome: Secondary | ICD-10-CM | POA: Diagnosis not present

## 2020-01-30 DIAGNOSIS — H353222 Exudative age-related macular degeneration, left eye, with inactive choroidal neovascularization: Secondary | ICD-10-CM | POA: Diagnosis not present

## 2020-01-30 DIAGNOSIS — H353211 Exudative age-related macular degeneration, right eye, with active choroidal neovascularization: Secondary | ICD-10-CM | POA: Diagnosis not present

## 2020-01-30 DIAGNOSIS — E1122 Type 2 diabetes mellitus with diabetic chronic kidney disease: Secondary | ICD-10-CM | POA: Diagnosis not present

## 2020-01-30 DIAGNOSIS — N1832 Chronic kidney disease, stage 3b: Secondary | ICD-10-CM | POA: Diagnosis not present

## 2020-01-30 DIAGNOSIS — R413 Other amnesia: Secondary | ICD-10-CM | POA: Diagnosis not present

## 2020-01-30 DIAGNOSIS — E1140 Type 2 diabetes mellitus with diabetic neuropathy, unspecified: Secondary | ICD-10-CM | POA: Diagnosis not present

## 2020-01-30 DIAGNOSIS — I700 Atherosclerosis of aorta: Secondary | ICD-10-CM | POA: Diagnosis not present

## 2020-01-30 DIAGNOSIS — E1165 Type 2 diabetes mellitus with hyperglycemia: Secondary | ICD-10-CM | POA: Diagnosis not present

## 2020-01-31 NOTE — Progress Notes (Signed)
Diabetes Education and Follow-Up Visit  86 y.o.female presents for diabetic education. She has Diabetes Mellitus type 2:  with diabetic chronic kidney disease and with diabetic neuropathy, unspecified, she is on bASA, and denies foot ulcerations, hypoglycemia , increased appetite, nausea, polydipsia, polyuria, visual disturbances, vomiting and weight loss. She has mild dementia, poor health literacy and poor compliance with nutrition and medical recommendations.   Last hemoglobin A1c was: Lab Results  Component Value Date   HGBA1C >14.0 (H) 12/23/2019   HGBA1C 9.9 (H) 08/30/2019   HGBA1C 10.5 (H) 05/13/2019    Body mass index is 23.56 kg/m.  Pt is on a regimen of:  Novolin 70/30 pen 18-22 units BID (up from 15-18 units previously) Off of metformin or other due to CKD 4  Pt checks her sugars 3 x day (fasting, before lunch, before dinner) -    She attributes elevations to her husband cooking more "he cooks what he likes to eat." She has demonstrated poor understanding/compliance with diet despite repeated education attempts in office. Referral was placed to nutritionist but she declined to go. Encouraged husband to follow up with her today, but he forgot hearing aids and very limited.   Lowest sugar 114 Highest sugar was 495 (checked right after eating). admitted  Glucometer: One touch, freestyle Elenor Legato was ordered but patient declined, "don't want to pay for patch" - unsure how much it would cost but was too much   Diet: whatever husband cooks, trying to improve and cut down on sugars and starches, admits wasn't paying attention to this in recent months, but has in last 2 weeks, still admits "could do better" - has switched to Dave's killer bread - thin sliced, old fashioned oats, uses stevia for sweetening, avoiding baked beans. Does admit to stewed apples, waffles -   Exercise: minimal amount of walking right now due to back pain (has seen Dr. Jillyn Hidden PA - requested report)  She  admits not checking BP at home, today their BP is BP: 130/60  She does not workout. She denies chest pain, shortness of breath, dizziness.   Patient does have CKD She is not on ACE/ARB - formerly on losartan, was d/c'd in 2017, unclear why, patient can't recall, was briefly restarted but recommended to stop by nephrology recently, back on ziac 5/6.25 mg  Has been pushing fluid intake ~4 bottles daily    Lab Results  Component Value Date   GFRNONAA 22 (L) 01/06/2020   GFRNONAA 24 (L) 12/23/2019   GFRNONAA 40 (L) 08/30/2019   Lab Results  Component Value Date   CREATININE 2.02 (H) 01/06/2020   BUN 38 (H) 01/06/2020   NA 141 01/06/2020   K 5.1 01/06/2020   CL 103 01/06/2020   CO2 29 01/06/2020   Lab Results  Component Value Date   MICROALBUR 4.5 08/30/2019     She is on a Statin, taking rosuvastatin 20 mg daily  She is not at goal of less than 70.  Lab Results  Component Value Date   CHOL 168 12/23/2019   HDL 58 12/23/2019   LDLCALC 73 12/23/2019   TRIG 280 (H) 12/23/2019   CHOLHDL 2.9 12/23/2019     Problem List has Hypertension; Chronic pain; CKD stage 4 due to type 2 diabetes mellitus ([place]); Hyperlipidemia associated with type 2 diabetes mellitus ([place]); Vitamin D deficiency; Neuropathy due to type 2 diabetes mellitus ([place]); Medication management; Encounter for Medicare annual wellness exam; Atherosclerosis of aorta ([place]); Type 2 diabetes mellitus with complication, with  long-term current use of insulin ([place]); Depression, major, recurrent, in partial remission ([place]); History of COVID-19; Long term (current) use of insulin ([place]); Exudative age-related macular degeneration of right eye with active choroidal neovascularization ([place]); Cystoid macular edema of right eye; Exudative age-related macular degeneration of left eye ([place]); Advanced nonexudative age-related macular degeneration of right eye with subfoveal involvement; Exudative age-related macular degeneration of left eye  with inactive choroidal neovascularization ([place]); Memory changes; Poorly controlled type 2 diabetes mellitus ([place]); Poor compliance with medication; and Advanced nonexudative age-related macular degeneration of left eye with subfoveal involvement on their problem list.  Medications Current Outpatient Medications on File Prior to Visit  Medication Sig  . acetaminophen (TYLENOL) 500 MG tablet Take 500-1,000 mg by mouth 3 (three) times daily as needed.  . bisoprolol-hydrochlorothiazide (ZIAC) 5-6.25 MG tablet Take 1 tablet by mouth daily.  Marland Kitchen aspirin 81 MG tablet Take 81 mg by mouth daily.  . ASSURE COMFORT LANCETS 30G MISC Check blood sugar 3 times daily-[id number]  . Blood Glucose Calibration (ACCU-CHEK AVIVA) SOLN Check blood sugar 3 times daily-[id number]  . blood glucose meter kit and supplies KIT Test blood sugar three times a day for Diabetes. Dx:E11.8/ Z79.4 .  Marland Kitchen blood glucose meter kit and supplies KIT Dispense based on patient and insurance preference. Use up to three times daily as directed. Dx:  . buPROPion (WELLBUTRIN XL) 150 MG 24 hr tablet Take 1 tablet every Morning for Mood , Focus & Concentration  . Cholecalciferol (VITAMIN D3) 2000 UNITS TABS Take 1 capsule by mouth 2 (two) times daily.  . COMFORT EZ PEN NEEDLES 32G X 4 MM MISC USE 2 TIMES DAILY AS DIRECTED , 1 PEN NEEDLE PER INJECTION.  Marland Kitchen Continuous Blood Gluc Sensor (FREESTYLE LIBRE SENSOR SYSTEM) MISC Check blood sugar 3 times a day. Dx:E11.8, Z79.4  . Continuous Blood Gluc Sensor MISC Check blood sugar 3 times a day- Dx: E11.8/Z79.4  . dorzolamide-timolol (COSOPT) 22.3-6.8 MG/ML ophthalmic solution   . EASY COMFORT INSULIN SYRINGE 30G X 1/2" 0.5 ML MISC   . gabapentin (NEURONTIN) 300 MG capsule Take 1 capsule 3 to 4 x / day for Painful Diabetic Neuropathy  . hyoscyamine (LEVSIN SL) 0.125 MG SL tablet Take 1 to 2 tablets 3 to 4 x day if needed for Nausea, vomiting, cramping or diarrhea  . lactulose (CHRONULAC) 10 GM/15ML solution  Take 15 ml (3 tsp) once or twice daily as needed for constipation.  Marland Kitchen latanoprost (XALATAN) 0.005 % ophthalmic solution   . losartan (COZAAR) 25 MG tablet Take 1 tab daily for blood pressure and kidney protection.  . LOTEMAX 0.5 % GEL   . Magnesium 400 MG TABS Take 1 tablet by mouth daily.  . Multiple Vitamins-Minerals (HAIR/SKIN/NAILS) TABS Take by mouth 2 (two) times daily.  Glory Rosebush VERIO test strip USE 1 STRIP TO CHECK GLUCOSE THREE TIMES DAILY AS DIRECTED  . rOPINIRole (REQUIP) 3 MG tablet Take 2 tablets   at Bedtime    for Restless Legs  . rosuvastatin (CRESTOR) 20 MG tablet TAKE 1 TABLET BY MOUTH ONCE DAILY FOR CHOLESTEROL  . senna-docusate (SENOKOT-S) 8.6-50 MG tablet Take 1 tablet by mouth daily.  . vitamin B-12 (CYANOCOBALAMIN) 1000 MCG tablet Take 1,000 mcg by mouth daily.  . vitamin C (ASCORBIC ACID) 500 MG tablet Take 500 mg by mouth daily.   No current facility-administered medications on file prior to visit.    ROS- see HPI  Physical Exam: Blood pressure 130/60, pulse (!) 58, temperature (!) 97.5  F (36.4 C), weight 133 lb (60.3 kg), SpO2 98 %. Body mass index is 23.56 kg/m. General Appearance: Well nourished, in no apparent distress. Eyes: PERRLA, EOMs, conjunctiva no swelling or erythema ENT/Mouth: Ext aud canals clear, TMs without erythema, bulging. No erythema, swelling, or exudate on post pharynx.  Tonsils not swollen or erythematous. Hearing normal.  Respiratory: Respiratory effort normal, BS equal bilaterally without rales, rhonchi, wheezing or stridor.  Cardio: RRR with no MRGs. Brisk peripheral pulses without edema.  Abdomen: Soft, + BS.  Non tender, no guarding, rebound, hernias, masses. Musculoskeletal: Full ROM, 5/5 strength, normal gait.  Skin: Warm, dry without rashes, lesions, ecchymosis.  Neuro: Cranial nerves intact. Normal muscle tone, no cerebellar symptoms. Gross sensation intact   Plan and Assessment:  Diabetes Education: Reviewed 'ABCs' of  diabetes management (respective goals in parentheses):  A1C (<8), blood pressure (<130/80), and cholesterol (LDL <70) Off of losartan per nephrologist Eye Exam yearly and Dental Exam every 6 months.- report requested Dietary recommendations Physical Activity recommendations - Continue with TID glucose - given sugar log and advised how to fill it and to bring it at next appt, follow up in 4 weeks - discussed insulin taper plan - insulin 18 units if low carb meal or having low sugars, 20 units if 150+, 22 units if 200+, 24 units if 250+ - compliance with diet and logging emphasized - given instructions for hypoglycemia management - call office for any low sugars or 300+ - given written instructions for low carb diet, high fiber, declined the nutritional counseling referral - referred for home health review and monitoring due to poor compliance - check fructosamine, CMP/GFR, UA - follow up as scheduled in 4-6 weeks  - we will have new chronic care management resources next year; she would be excellent candidate - will plan to set up ASAP   Izora Ribas, NP 1:03 PM [facility]   Future Appointments  Date Time Provider [place]  02/06/2020 12:30 PM [facility] DG C-ARM RM 3 [id number] [facility]  03/05/2020  2:15 PM Rankin, Clent Demark, MD RDE-RDE None  03/25/2020 11:30 AM [name], MD GAAM-GAAIM None  09/09/2020 10:00 AM Garnet Sierras, NP [facility] None

## 2020-02-03 ENCOUNTER — Ambulatory Visit: Payer: Medicare Other | Admitting: Skilled Nursing Facility1

## 2020-02-03 DIAGNOSIS — R413 Other amnesia: Secondary | ICD-10-CM | POA: Diagnosis not present

## 2020-02-03 DIAGNOSIS — G894 Chronic pain syndrome: Secondary | ICD-10-CM | POA: Diagnosis not present

## 2020-02-03 DIAGNOSIS — G2581 Restless legs syndrome: Secondary | ICD-10-CM | POA: Diagnosis not present

## 2020-02-03 DIAGNOSIS — N1832 Chronic kidney disease, stage 3b: Secondary | ICD-10-CM | POA: Diagnosis not present

## 2020-02-03 DIAGNOSIS — H353211 Exudative age-related macular degeneration, right eye, with active choroidal neovascularization: Secondary | ICD-10-CM | POA: Diagnosis not present

## 2020-02-03 DIAGNOSIS — I10 Essential (primary) hypertension: Secondary | ICD-10-CM | POA: Diagnosis not present

## 2020-02-03 DIAGNOSIS — I700 Atherosclerosis of aorta: Secondary | ICD-10-CM | POA: Diagnosis not present

## 2020-02-03 DIAGNOSIS — H353222 Exudative age-related macular degeneration, left eye, with inactive choroidal neovascularization: Secondary | ICD-10-CM | POA: Diagnosis not present

## 2020-02-03 DIAGNOSIS — E1122 Type 2 diabetes mellitus with diabetic chronic kidney disease: Secondary | ICD-10-CM | POA: Diagnosis not present

## 2020-02-03 DIAGNOSIS — E782 Mixed hyperlipidemia: Secondary | ICD-10-CM | POA: Diagnosis not present

## 2020-02-03 DIAGNOSIS — E1165 Type 2 diabetes mellitus with hyperglycemia: Secondary | ICD-10-CM | POA: Diagnosis not present

## 2020-02-03 DIAGNOSIS — E1140 Type 2 diabetes mellitus with diabetic neuropathy, unspecified: Secondary | ICD-10-CM | POA: Diagnosis not present

## 2020-02-04 ENCOUNTER — Encounter: Payer: Self-pay | Admitting: Adult Health

## 2020-02-04 ENCOUNTER — Ambulatory Visit (INDEPENDENT_AMBULATORY_CARE_PROVIDER_SITE_OTHER): Payer: Medicare Other | Admitting: Adult Health

## 2020-02-04 ENCOUNTER — Other Ambulatory Visit: Payer: Self-pay

## 2020-02-04 VITALS — BP 130/60 | HR 58 | Temp 97.5°F | Wt 133.0 lb

## 2020-02-04 DIAGNOSIS — R413 Other amnesia: Secondary | ICD-10-CM | POA: Diagnosis not present

## 2020-02-04 DIAGNOSIS — N390 Urinary tract infection, site not specified: Secondary | ICD-10-CM

## 2020-02-04 DIAGNOSIS — E118 Type 2 diabetes mellitus with unspecified complications: Secondary | ICD-10-CM

## 2020-02-04 DIAGNOSIS — N184 Chronic kidney disease, stage 4 (severe): Secondary | ICD-10-CM

## 2020-02-04 DIAGNOSIS — E1165 Type 2 diabetes mellitus with hyperglycemia: Secondary | ICD-10-CM | POA: Diagnosis not present

## 2020-02-04 DIAGNOSIS — Z794 Long term (current) use of insulin: Secondary | ICD-10-CM

## 2020-02-04 DIAGNOSIS — E1122 Type 2 diabetes mellitus with diabetic chronic kidney disease: Secondary | ICD-10-CM

## 2020-02-04 DIAGNOSIS — Z9114 Patient's other noncompliance with medication regimen: Secondary | ICD-10-CM

## 2020-02-04 DIAGNOSIS — Z79899 Other long term (current) drug therapy: Secondary | ICD-10-CM | POA: Diagnosis not present

## 2020-02-04 DIAGNOSIS — I10 Essential (primary) hypertension: Secondary | ICD-10-CM

## 2020-02-04 MED ORDER — NOVOLIN 70/30 FLEXPEN RELION (70-30) 100 UNIT/ML ~~LOC~~ SUPN: PEN_INJECTOR | SUBCUTANEOUS | 0 refills | Status: DC

## 2020-02-04 NOTE — Patient Instructions (Addendum)
Recommend tylenol 1-2 tabs three times a day as needed for back pain   Please STOP waffles  Limit Dave's killer bread to 1-2 slices per day if possible          Bad carbs also include fruit juice, alcohol, and sweet tea. These are empty calories that do not signal to your brain that you are full.   Please remember the good carbs are still carbs which convert into sugar. So please measure them out no more than 1/2-1 cup of rice, oatmeal, pasta, and beans  Veggies are however free foods! Pile them on.   Not all fruit is created equal. Please see the list below, the fruit at the bottom is higher in sugars than the fruit at the top. Please avoid all dried fruits.           High-Fiber Diet Fiber, also called dietary fiber, is a type of carbohydrate that is found in fruits, vegetables, whole grains, and beans. A high-fiber diet can have many health benefits. Your health care provider may recommend a high-fiber diet to help:  Prevent constipation. Fiber can make your bowel movements more regular.  Lower your cholesterol.  Relieve the following conditions: ? Swelling of veins in the anus (hemorrhoids). ? Swelling and irritation (inflammation) of specific areas of the digestive tract (uncomplicated diverticulosis). ? A problem of the large intestine (colon) that sometimes causes pain and diarrhea (irritable bowel syndrome, IBS).  Prevent overeating as part of a weight-loss plan.  Prevent heart disease, type 2 diabetes, and certain cancers. What is my plan? The recommended daily fiber intake in grams (g) includes:  38 g for men age 8 or younger.  30 g for men over age 61.  47 g for women age 78 or younger.  21 g for women over age 28. You can get the recommended daily intake of dietary fiber by:  Eating a variety of fruits, vegetables, grains, and beans.  Taking a fiber supplement, if it is not possible to get enough fiber through your diet. What do I need to  know about a high-fiber diet?  It is better to get fiber through food sources rather than from fiber supplements. There is not a lot of research about how effective supplements are.  Always check the fiber content on the nutrition facts label of any prepackaged food. Look for foods that contain 5 g of fiber or more per serving.  Talk with a diet and nutrition specialist (dietitian) if you have questions about specific foods that are recommended or not recommended for your medical condition, especially if those foods are not listed below.  Gradually increase how much fiber you consume. If you increase your intake of dietary fiber too quickly, you may have bloating, cramping, or gas.  Drink plenty of water. Water helps you to digest fiber. What are tips for following this plan?  Eat a wide variety of high-fiber foods.  Make sure that half of the grains that you eat each day are whole grains.  Eat breads and cereals that are made with whole-grain flour instead of refined flour or white flour.  Eat brown rice, bulgur wheat, or millet instead of white rice.  Start the day with a breakfast that is high in fiber, such as a cereal that contains 5 g of fiber or more per serving.  Use beans in place of meat in soups, salads, and pasta dishes.  Eat high-fiber snacks, such as berries, raw vegetables, nuts, and  popcorn.  Choose whole fruits and vegetables instead of processed forms like juice or sauce. What foods can I eat?  Fruits Berries. Pears. Apples. Oranges. Avocado. Prunes and raisins. Dried figs. Vegetables Sweet potatoes. Spinach. Kale. Artichokes. Cabbage. Broccoli. Cauliflower. Green peas. Carrots. Squash. Grains Whole-grain breads. Multigrain cereal. Oats and oatmeal. Brown rice. Barley. Bulgur wheat. Planada. Quinoa. Bran muffins. Popcorn. Rye wafer crackers. Meats and other proteins Navy, kidney, and pinto beans. Soybeans. Split peas. Lentils. Nuts and  seeds. Dairy Fiber-fortified yogurt. Beverages Fiber-fortified soy milk. Fiber-fortified orange juice. Other foods Fiber bars. The items listed above may not be a complete list of recommended foods and beverages. Contact a dietitian for more options. What foods are not recommended? Fruits Fruit juice. Cooked, strained fruit. Vegetables Fried potatoes. Canned vegetables. Well-cooked vegetables. Grains White bread. Pasta made with refined flour. White rice. Meats and other proteins Fatty cuts of meat. Fried chicken or fried fish. Dairy Milk. Yogurt. Cream cheese. Sour cream. Fats and oils Butters. Beverages Soft drinks. Other foods Cakes and pastries. The items listed above may not be a complete list of foods and beverages to avoid. Contact a dietitian for more information. Summary  Fiber is a type of carbohydrate. It is found in fruits, vegetables, whole grains, and beans.  There are many health benefits of eating a high-fiber diet, such as preventing constipation, lowering blood cholesterol, helping with weight loss, and reducing your risk of heart disease, diabetes, and certain cancers.  Gradually increase your intake of fiber. Increasing too fast can result in cramping, bloating, and gas. Drink plenty of water while you increase your fiber.  The best sources of fiber include whole fruits and vegetables, whole grains, nuts, seeds, and beans. This information is not intended to replace advice given to you by your health care provider. Make sure you discuss any questions you have with your health care provider. Document Revised: 02/13/2017 Document Reviewed: 02/13/2017 Elsevier Patient Education  2020 [employer].

## 2020-02-05 DIAGNOSIS — R413 Other amnesia: Secondary | ICD-10-CM | POA: Diagnosis not present

## 2020-02-05 DIAGNOSIS — G2581 Restless legs syndrome: Secondary | ICD-10-CM | POA: Diagnosis not present

## 2020-02-05 DIAGNOSIS — I10 Essential (primary) hypertension: Secondary | ICD-10-CM | POA: Diagnosis not present

## 2020-02-05 DIAGNOSIS — H353222 Exudative age-related macular degeneration, left eye, with inactive choroidal neovascularization: Secondary | ICD-10-CM | POA: Diagnosis not present

## 2020-02-05 DIAGNOSIS — E1165 Type 2 diabetes mellitus with hyperglycemia: Secondary | ICD-10-CM | POA: Diagnosis not present

## 2020-02-05 DIAGNOSIS — E1122 Type 2 diabetes mellitus with diabetic chronic kidney disease: Secondary | ICD-10-CM | POA: Diagnosis not present

## 2020-02-05 DIAGNOSIS — E1140 Type 2 diabetes mellitus with diabetic neuropathy, unspecified: Secondary | ICD-10-CM | POA: Diagnosis not present

## 2020-02-05 DIAGNOSIS — E782 Mixed hyperlipidemia: Secondary | ICD-10-CM | POA: Diagnosis not present

## 2020-02-05 DIAGNOSIS — G894 Chronic pain syndrome: Secondary | ICD-10-CM | POA: Diagnosis not present

## 2020-02-05 DIAGNOSIS — I700 Atherosclerosis of aorta: Secondary | ICD-10-CM | POA: Diagnosis not present

## 2020-02-05 DIAGNOSIS — H353211 Exudative age-related macular degeneration, right eye, with active choroidal neovascularization: Secondary | ICD-10-CM | POA: Diagnosis not present

## 2020-02-05 DIAGNOSIS — N1832 Chronic kidney disease, stage 3b: Secondary | ICD-10-CM | POA: Diagnosis not present

## 2020-02-06 ENCOUNTER — Other Ambulatory Visit: Payer: Self-pay

## 2020-02-06 ENCOUNTER — Ambulatory Visit
Admission: RE | Admit: 2020-02-06 | Discharge: 2020-02-06 | Disposition: A | Discharge status: Home / Self Care | Payer: Medicare Other | Source: Ambulatory Visit | Attending: Orthopaedic Surgery | Admitting: Orthopaedic Surgery

## 2020-02-06 DIAGNOSIS — M47816 Spondylosis without myelopathy or radiculopathy, lumbar region: Secondary | ICD-10-CM

## 2020-02-06 MED ORDER — IOPAMIDOL (ISOVUE-M 200) INJECTION 41%
1.0000 mL | Freq: Once | INTRAMUSCULAR | Status: AC
  Administered 2020-02-06: 1 mL via EPIDURAL

## 2020-02-06 MED ORDER — METHYLPREDNISOLONE ACETATE 40 MG/ML INJ SUSP (RADIOLOG
120.0000 mg | Freq: Once | INTRAMUSCULAR | Status: AC
  Administered 2020-02-06: 120 mg via EPIDURAL

## 2020-02-06 NOTE — Discharge Instructions (Signed)

## 2020-02-07 LAB — URINE CULTURE
MICRO NUMBER:: 11062968
SPECIMEN QUALITY:: ADEQUATE

## 2020-02-07 LAB — BASIC METABOLIC PANEL WITH GFR
BUN/Creatinine Ratio: 22 (calc) (ref 6–22)
BUN: 35 mg/dL — ABNORMAL HIGH (ref 7–25)
CO2: 32 mmol/L (ref 20–32)
Calcium: 9.6 mg/dL (ref 8.6–10.4)
Chloride: 101 mmol/L (ref 98–110)
Creat: 1.59 mg/dL — ABNORMAL HIGH (ref 0.60–0.88)
GFR, Est African American: 33 mL/min/{1.73_m2} — ABNORMAL LOW (ref >=60)
GFR, Est Non African American: 29 mL/min/{1.73_m2} — ABNORMAL LOW (ref >=60)
Glucose, Bld: 275 mg/dL — ABNORMAL HIGH (ref 65–99)
Potassium: 4.9 mmol/L (ref 3.5–5.3)
Sodium: 140 mmol/L (ref 135–146)

## 2020-02-07 LAB — URINALYSIS W MICROSCOPIC + REFLEX CULTURE
Bacteria, UA: NONE SEEN /HPF
Bilirubin Urine: NEGATIVE
Hgb urine dipstick: NEGATIVE
Hyaline Cast: NONE SEEN /LPF
Ketones, ur: NEGATIVE
Nitrites, Initial: NEGATIVE
Protein, ur: NEGATIVE
RBC / HPF: NONE SEEN /HPF (ref 0–2)
Specific Gravity, Urine: 1.014 (ref 1.001–1.03)
Squamous Epithelial / HPF: NONE SEEN /HPF (ref <=5)
pH: 6 (ref 5.0–8.0)

## 2020-02-07 LAB — CULTURE INDICATED

## 2020-02-07 LAB — FRUCTOSAMINE: Fructosamine: 454 umol/L — ABNORMAL HIGH (ref 205–285)

## 2020-02-10 ENCOUNTER — Other Ambulatory Visit: Payer: Self-pay | Admitting: Adult Health

## 2020-02-10 DIAGNOSIS — N390 Urinary tract infection, site not specified: Secondary | ICD-10-CM | POA: Insufficient documentation

## 2020-02-11 DIAGNOSIS — N184 Chronic kidney disease, stage 4 (severe): Secondary | ICD-10-CM | POA: Diagnosis not present

## 2020-02-14 ENCOUNTER — Encounter: Payer: Self-pay | Admitting: Internal Medicine

## 2020-02-18 ENCOUNTER — Encounter (INDEPENDENT_AMBULATORY_CARE_PROVIDER_SITE_OTHER): Payer: Medicare Other | Admitting: Ophthalmology

## 2020-02-20 ENCOUNTER — Telehealth: Payer: Self-pay

## 2020-02-20 NOTE — Telephone Encounter (Signed)
[name] from [place] calling to let us know that [name] would like to be discharged from [facility].

## 2020-02-24 NOTE — Telephone Encounter (Signed)
Patient states that her blood sugar is up and down, this morning it was 147, injected 20 units and last night it was in the 200's.

## 2020-03-05 ENCOUNTER — Ambulatory Visit (INDEPENDENT_AMBULATORY_CARE_PROVIDER_SITE_OTHER): Payer: Medicare Other | Admitting: Ophthalmology

## 2020-03-05 ENCOUNTER — Other Ambulatory Visit: Payer: Self-pay

## 2020-03-05 ENCOUNTER — Encounter (INDEPENDENT_AMBULATORY_CARE_PROVIDER_SITE_OTHER): Payer: Self-pay | Admitting: Ophthalmology

## 2020-03-05 DIAGNOSIS — H353124 Nonexudative age-related macular degeneration, left eye, advanced atrophic with subfoveal involvement: Secondary | ICD-10-CM | POA: Diagnosis not present

## 2020-03-05 DIAGNOSIS — H353114 Nonexudative age-related macular degeneration, right eye, advanced atrophic with subfoveal involvement: Secondary | ICD-10-CM | POA: Diagnosis not present

## 2020-03-05 DIAGNOSIS — H353211 Exudative age-related macular degeneration, right eye, with active choroidal neovascularization: Secondary | ICD-10-CM

## 2020-03-05 DIAGNOSIS — H353222 Exudative age-related macular degeneration, left eye, with inactive choroidal neovascularization: Secondary | ICD-10-CM

## 2020-03-05 NOTE — Assessment & Plan Note (Signed)
Moderate impact on acuity of present

## 2020-03-05 NOTE — Assessment & Plan Note (Signed)
No signs of reactivation

## 2020-03-05 NOTE — Progress Notes (Signed)
03/05/2020     CHIEF COMPLAINT Patient presents for Retina Follow Up   HISTORY OF PRESENT ILLNESS: [name] is a 86 y.o. female who presents to the clinic today for:   HPI    Retina Follow Up    Patient presents with  Wet AMD.  In right eye.  This started 8 weeks ago.  Severity is mild.  Duration of 8 weeks.  Since onset it is stable.          Comments    8 Week F/U OU  Pt c/o difficulty focusing OD in the AM upon waking. Pt sts she has been waking up with "matter" in OD. OS stable.        Last edited by Rockie Neighbours, Lynn on 03/05/2020  2:35 PM. (History)      Referring physician: [name], MD [address] [place],  [place] [address]  HISTORICAL INFORMATION:   Selected notes from the MEDICAL RECORD NUMBER    Lab Results  Component Value Date   HGBA1C >14.0 (H) 12/23/2019     CURRENT MEDICATIONS: Current Outpatient Medications (Ophthalmic Drugs)  Medication Sig  . latanoprost (XALATAN) 0.005 % ophthalmic solution   . dorzolamide-timolol (COSOPT) 22.3-6.8 MG/ML ophthalmic solution  (Patient not taking: Reported on 03/05/2020)  . LOTEMAX 0.5 % GEL  (Patient not taking: Reported on 03/05/2020)   No current facility-administered medications for this visit. (Ophthalmic Drugs)   Current Outpatient Medications (Other)  Medication Sig  . acetaminophen (TYLENOL) 500 MG tablet Take 500-1,000 mg by mouth 3 (three) times daily as needed.  Marland Kitchen aspirin 81 MG tablet Take 81 mg by mouth daily.  . ASSURE COMFORT LANCETS 30G MISC Check blood sugar 3 times daily-DX-E11.22  . bisoprolol-hydrochlorothiazide (ZIAC) 5-6.25 MG tablet Take 1 tablet by mouth daily.  . Blood Glucose Calibration (ACCU-CHEK AVIVA) SOLN Check blood sugar 3 times daily-DX-E11.22  . blood glucose meter kit and supplies KIT Test blood sugar three times a day for Diabetes. Dx:E11.8/ Z79.4 .  Marland Kitchen blood glucose meter kit and supplies KIT Dispense based on patient and insurance  preference. Use up to three times daily as directed. Dx:  . buPROPion (WELLBUTRIN XL) 150 MG 24 hr tablet Take 1 tablet every Morning for Mood , Focus & Concentration  . Cholecalciferol (VITAMIN D3) 2000 UNITS TABS Take 1 capsule by mouth 2 (two) times daily.  . COMFORT EZ PEN NEEDLES 32G X 4 MM MISC USE 2 TIMES DAILY AS DIRECTED , 1 PEN NEEDLE PER INJECTION.  Marland Kitchen Continuous Blood Gluc Sensor (FREESTYLE LIBRE SENSOR SYSTEM) MISC Check blood sugar 3 times a day. Dx:E11.8, Z79.4  . Continuous Blood Gluc Sensor MISC Check blood sugar 3 times a day- Dx: E11.8/Z79.4  . EASY COMFORT INSULIN SYRINGE 30G X 1/2" 0.5 ML MISC   . gabapentin (NEURONTIN) 300 MG capsule Take 1 capsule 3 to 4 x / day for Painful Diabetic Neuropathy  . hyoscyamine (LEVSIN SL) 0.125 MG SL tablet Take 1 to 2 tablets 3 to 4 x day if needed for Nausea, vomiting, cramping or diarrhea  . insulin isophane & regular human (NOVOLIN 70/30 FLEXPEN RELION) (70-30) 100 UNIT/ML KwikPen Inject 18-24 units subcutaneously with breakfast and dinner as directed for diabetes/high blood sugar.  . lactulose (CHRONULAC) 10 GM/15ML solution Take 15 ml (3 tsp) once or twice daily as needed for constipation.  Marland Kitchen losartan (COZAAR) 25 MG tablet Take 1 tab daily for blood pressure and kidney protection.  . Magnesium 400  MG TABS Take 1 tablet by mouth daily.  . Multiple Vitamins-Minerals (HAIR/SKIN/NAILS) TABS Take by mouth 2 (two) times daily.  Glory Rosebush VERIO test strip USE 1 STRIP TO CHECK GLUCOSE THREE TIMES DAILY AS DIRECTED  . rOPINIRole (REQUIP) 3 MG tablet Take 2 tablets   at Bedtime    for Restless Legs  . rosuvastatin (CRESTOR) 20 MG tablet TAKE 1 TABLET BY MOUTH ONCE DAILY FOR CHOLESTEROL  . senna-docusate (SENOKOT-S) 8.6-50 MG tablet Take 1 tablet by mouth daily.  . vitamin B-12 (CYANOCOBALAMIN) 1000 MCG tablet Take 1,000 mcg by mouth daily.  . vitamin C (ASCORBIC ACID) 500 MG tablet Take 500 mg by mouth daily.   No current facility-administered  medications for this visit. (Other)      REVIEW OF SYSTEMS:    ALLERGIES Allergies  Allergen Reactions  . Morphine And Related Other (See Comments)    fuzzy feeling    PAST MEDICAL HISTORY Past Medical History:  Diagnosis Date  . Arthritis   . Chronic back pain   . Diabetic neuropathy ([place])   . Hypercholesteremia   . Hypertension   . Liver lesion   . Neuropathy   . Type II or unspecified type diabetes mellitus without mention of complication, not stated as uncontrolled   . Vitamin D deficiency   . Wears glasses    Past Surgical History:  Procedure Laterality Date  . ABDOMINAL HYSTERECTOMY    . APPENDECTOMY    . BACK SURGERY  1993   lumb lam-fusion  . BREAST EXCISIONAL BIOPSY Right 1967   benign  . CESAREAN SECTION     x 3   . COLONOSCOPY    . EYE SURGERY     both cataracts  . TRIGGER FINGER RELEASE  01/10/2012   Procedure: RELEASE TRIGGER FINGER/A-1 PULLEY;  Surgeon: Hessie Dibble, MD;  Location: [facility];  Service: Orthopedics;  Laterality: Right;  right ring trigger release    FAMILY HISTORY Family History  Problem Relation Age of Onset  . Liver disease Sister   . Diabetes Sister   . Colon cancer Neg Hx     SOCIAL HISTORY Social History   Tobacco Use  . Smoking status: Never Smoker  . Smokeless tobacco: Never Used  Substance Use Topics  . Alcohol use: No  . Drug use: No         OPHTHALMIC EXAM: Base Eye Exam    Visual Acuity (ETDRS)      Right Left   Dist cc 20/100 -2 20/50 -2   Dist ph cc NI NI   Correction: Glasses       Tonometry (Tonopen, 2:33 PM)      Right Left   Pressure 18 17       Pupils      Pupils Dark Light Shape React APD   Right PERRL 1 1 Round Minimal None   Left PERRL 1 1 Round Minimal None       Visual Fields (Counting fingers)      Left Right    Full Full       Extraocular Movement      Right Left    Full Full       Neuro/Psych    Oriented x3: Yes   Mood/Affect: Normal        Dilation    Both eyes: 1.0% Mydriacyl, 2.5% Phenylephrine @ 2:41 PM        Slit Lamp and Fundus Exam    External Exam  Right Left   External Normal Normal       Slit Lamp Exam      Right Left   Lids/Lashes Normal Normal   Conjunctiva/Sclera White and quiet White and quiet   Cornea Clear Clear   Anterior Chamber Deep and quiet Deep and quiet   Iris Round and reactive Round and reactive   Lens Posterior chamber intraocular lens Posterior chamber intraocular lens   Anterior Vitreous Normal Normal       Fundus Exam      Right Left   Posterior Vitreous Posterior vitreous detachment Posterior vitreous detachment   Disc Normal Normal   C/D Ratio 0.4 0.45   Macula Geographic atrophy 3 DA size, Advanced age related macular degeneration, no macular thickening, Retinal pigment epithelial mottling, no exudates Geographic atrophy 2 DA size, Advanced age related macular degeneration, no macular thickening, Retinal pigment epithelial mottling   Vessels Normal Normal   Periphery Normal Normal          IMAGING AND PROCEDURES  Imaging and Procedures for 03/05/20  OCT, Retina - OU - Both Eyes       Right Eye Quality was good. Scan locations included subfoveal. Central Foveal Thickness: 300. Progression has been stable. Findings include abnormal foveal contour, no SRF, no IRF, inner retinal atrophy, central retinal atrophy, outer retinal atrophy.   Left Eye Quality was good. Scan locations included subfoveal. Central Foveal Thickness: 237. Progression has been stable. Findings include abnormal foveal contour, no IRF, outer retinal atrophy, central retinal atrophy, inner retinal atrophy.   Notes Dry age-related maculopathy, no signs of active CNVM at present.  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 Exudative age-related macular degeneration of right eye with active choroidal neovascularization (HCC)  H35.3211 OCT, Retina - OU - Both Eyes  2. Advanced nonexudative age-related macular degeneration of left eye with subfoveal involvement  H35.3124   3. Advanced nonexudative age-related macular degeneration of right eye with subfoveal involvement  H35.3114   4. Exudative age-related macular degeneration of left eye with inactive choroidal neovascularization ([place])  H35.3222     1.  No active CNVM OU, I ARMD will continue to monitor  2.  3.  Ophthalmic Meds Ordered this visit:  No orders of the defined types were placed in this encounter.      Return in about 6 months (around 09/02/2020) for DILATE OU, OCT.  There are no Patient Instructions on file for this visit.   Explained the diagnoses, plan, and follow up with the patient and they expressed understanding.  Patient expressed understanding of the importance of proper follow up care.   Clent Demark Rankin M.D. Diseases & Surgery of the Retina and Vitreous Retina & Diabetic [place] 03/05/20     Abbreviations: M myopia (nearsighted); A astigmatism; H hyperopia (farsighted); P presbyopia; Mrx spectacle prescription;  CTL contact lenses; OD right eye; OS left eye; OU both eyes  XT exotropia; ET esotropia; PEK punctate epithelial keratitis; PEE punctate epithelial erosions; DES dry eye syndrome; MGD meibomian  gland dysfunction; ATs artificial tears; PFAT's preservative free artificial tears; [place] nuclear sclerotic cataract; PSC posterior subcapsular cataract; ERM epi-retinal membrane; PVD posterior vitreous detachment; RD retinal detachment; DM  diabetes mellitus; DR diabetic retinopathy; NPDR non-proliferative diabetic retinopathy; PDR proliferative diabetic retinopathy; CSME clinically significant macular edema; DME diabetic macular edema; dbh dot blot hemorrhages; CWS cotton wool spot; POAG primary open angle glaucoma; C/D cup-to-disc ratio; HVF humphrey visual field; GVF goldmann visual field; OCT optical coherence tomography; IOP intraocular pressure; BRVO Branch retinal vein occlusion; CRVO central retinal vein occlusion; CRAO central retinal artery occlusion; BRAO branch retinal artery occlusion; RT retinal tear; SB scleral buckle; PPV pars plana vitrectomy; VH Vitreous hemorrhage; PRP panretinal laser photocoagulation; IVK intravitreal kenalog; VMT vitreomacular traction; MH Macular hole;  NVD neovascularization of the disc; NVE neovascularization elsewhere; AREDS age related eye disease study; ARMD age related macular degeneration; POAG primary open angle glaucoma; EBMD epithelial/anterior basement membrane dystrophy; ACIOL anterior chamber intraocular lens; IOL intraocular lens; PCIOL posterior chamber intraocular lens; Phaco/IOL phacoemulsification with intraocular lens placement; [place] photorefractive keratectomy; LASIK laser assisted in situ keratomileusis; HTN hypertension; DM diabetes mellitus; COPD chronic obstructive pulmonary disease

## 2020-03-05 NOTE — Assessment & Plan Note (Signed)
Counts for acuity, no signs of CNVM

## 2020-03-06 DIAGNOSIS — M7062 Trochanteric bursitis, left hip: Secondary | ICD-10-CM | POA: Diagnosis not present

## 2020-03-06 DIAGNOSIS — M25552 Pain in left hip: Secondary | ICD-10-CM | POA: Diagnosis not present

## 2020-03-09 DIAGNOSIS — M25552 Pain in left hip: Secondary | ICD-10-CM | POA: Diagnosis not present

## 2020-03-12 ENCOUNTER — Other Ambulatory Visit: Payer: Self-pay

## 2020-03-12 DIAGNOSIS — E782 Mixed hyperlipidemia: Secondary | ICD-10-CM

## 2020-03-12 MED ORDER — ROSUVASTATIN CALCIUM 20 MG PO TABS: ORAL_TABLET | ORAL | 1 refills | Status: DC

## 2020-03-25 ENCOUNTER — Ambulatory Visit: Payer: Medicare Other | Admitting: Adult Health Nurse Practitioner

## 2020-04-07 ENCOUNTER — Ambulatory Visit: Payer: Medicare Other | Admitting: Adult Health

## 2020-04-27 DIAGNOSIS — M47816 Spondylosis without myelopathy or radiculopathy, lumbar region: Secondary | ICD-10-CM | POA: Diagnosis not present

## 2020-04-27 DIAGNOSIS — M5136 Other intervertebral disc degeneration, lumbar region: Secondary | ICD-10-CM | POA: Diagnosis not present

## 2020-04-27 DIAGNOSIS — I10 Essential (primary) hypertension: Secondary | ICD-10-CM | POA: Diagnosis not present

## 2020-04-27 DIAGNOSIS — G8929 Other chronic pain: Secondary | ICD-10-CM | POA: Diagnosis not present

## 2020-04-27 DIAGNOSIS — M5442 Lumbago with sciatica, left side: Secondary | ICD-10-CM | POA: Diagnosis not present

## 2020-04-27 DIAGNOSIS — M5416 Radiculopathy, lumbar region: Secondary | ICD-10-CM | POA: Diagnosis not present

## 2020-04-27 DIAGNOSIS — Z981 Arthrodesis status: Secondary | ICD-10-CM | POA: Diagnosis not present

## 2020-05-04 DIAGNOSIS — N189 Chronic kidney disease, unspecified: Secondary | ICD-10-CM | POA: Diagnosis not present

## 2020-05-04 DIAGNOSIS — N183 Chronic kidney disease, stage 3 unspecified: Secondary | ICD-10-CM | POA: Diagnosis not present

## 2020-05-04 DIAGNOSIS — N184 Chronic kidney disease, stage 4 (severe): Secondary | ICD-10-CM | POA: Diagnosis not present

## 2020-05-04 DIAGNOSIS — E1122 Type 2 diabetes mellitus with diabetic chronic kidney disease: Secondary | ICD-10-CM | POA: Diagnosis not present

## 2020-05-04 DIAGNOSIS — I129 Hypertensive chronic kidney disease with stage 1 through stage 4 chronic kidney disease, or unspecified chronic kidney disease: Secondary | ICD-10-CM | POA: Diagnosis not present

## 2020-05-04 DIAGNOSIS — R319 Hematuria, unspecified: Secondary | ICD-10-CM | POA: Diagnosis not present

## 2020-05-04 DIAGNOSIS — N390 Urinary tract infection, site not specified: Secondary | ICD-10-CM | POA: Diagnosis not present

## 2020-05-05 ENCOUNTER — Encounter (INDEPENDENT_AMBULATORY_CARE_PROVIDER_SITE_OTHER): Payer: Self-pay | Admitting: Ophthalmology

## 2020-05-05 ENCOUNTER — Other Ambulatory Visit: Payer: Self-pay

## 2020-05-05 ENCOUNTER — Ambulatory Visit (INDEPENDENT_AMBULATORY_CARE_PROVIDER_SITE_OTHER): Payer: Medicare Other | Admitting: Ophthalmology

## 2020-05-05 DIAGNOSIS — H353124 Nonexudative age-related macular degeneration, left eye, advanced atrophic with subfoveal involvement: Secondary | ICD-10-CM

## 2020-05-05 DIAGNOSIS — H35351 Cystoid macular degeneration, right eye: Secondary | ICD-10-CM | POA: Diagnosis not present

## 2020-05-05 DIAGNOSIS — H353114 Nonexudative age-related macular degeneration, right eye, advanced atrophic with subfoveal involvement: Secondary | ICD-10-CM | POA: Diagnosis not present

## 2020-05-05 DIAGNOSIS — H353211 Exudative age-related macular degeneration, right eye, with active choroidal neovascularization: Secondary | ICD-10-CM

## 2020-05-05 NOTE — Progress Notes (Signed)
05/05/2020     CHIEF COMPLAINT Patient presents for Blurred Vision (Pt c/o decrease vision OU, OD> OS. Pt states she noticed her vision was becoming more blurry about 3 weeks ago. Pt states she has discharge in OU when she wakes up. Denies FOL and floaters.)   HISTORY OF PRESENT ILLNESS: [name] is a 87 y.o. female who presents to the clinic today for:   HPI    Blurred Vision    In both eyes.  Onset was sudden.  Vision is blurred.  Severity is moderate.  This started 3 weeks ago.  Occurring constantly.  It is worse throughout the day.  Since onset it is stable.  Treatments tried include no treatments.  Response to treatment was no improvement.  I, the attending physician,  performed the HPI with the patient and updated documentation appropriately. Additional comments: Pt c/o decrease vision OU, OD> OS. Pt states she noticed her vision was becoming more blurry about 3 weeks ago. Pt states she has discharge in OU when she wakes up. Denies FOL and floaters.          Comments    Patient reports matting of her eyelids together in the morning countable potentially for the blurry vision.  She cannot locali change the distortion in vision into either's particular eye that lasts all throughout the day ze       Last edited by [name], MD on 05/05/2020  2:37 PM. (History)      Referring physician: [name], MD [address] [place],  [place] [address]  HISTORICAL INFORMATION:   Selected notes from the [facility]    Lab Results  Component Value Date   HGBA1C >14.0 (H) 12/23/2019     CURRENT MEDICATIONS: Current Outpatient Medications (Ophthalmic Drugs)  Medication Sig  . dorzolamide-timolol (COSOPT) 22.3-6.8 MG/ML ophthalmic solution  (Patient not taking: Reported on 03/05/2020)  . latanoprost (XALATAN) 0.005 % ophthalmic solution   . LOTEMAX 0.5 % GEL  (Patient not taking: Reported on 03/05/2020)   No current facility-administered  medications for this visit. (Ophthalmic Drugs)   Current Outpatient Medications (Other)  Medication Sig  . acetaminophen (TYLENOL) 500 MG tablet Take 500-1,000 mg by mouth 3 (three) times daily as needed.  Marland Kitchen aspirin 81 MG tablet Take 81 mg by mouth daily.  . ASSURE COMFORT LANCETS 30G MISC Check blood sugar 3 times daily-DX-E11.22  . bisoprolol-hydrochlorothiazide (ZIAC) 5-6.25 MG tablet Take 1 tablet by mouth daily.  . Blood Glucose Calibration (ACCU-CHEK AVIVA) SOLN Check blood sugar 3 times daily-DX-E11.22  . blood glucose meter kit and supplies KIT Test blood sugar three times a day for Diabetes. Dx:E11.8/ Z79.4 .  Marland Kitchen blood glucose meter kit and supplies KIT Dispense based on patient and insurance preference. Use up to three times daily as directed. Dx:  . buPROPion (WELLBUTRIN XL) 150 MG 24 hr tablet Take 1 tablet every Morning for Mood , Focus & Concentration  . Cholecalciferol (VITAMIN D3) 2000 UNITS TABS Take 1 capsule by mouth 2 (two) times daily.  . COMFORT EZ PEN NEEDLES 32G X 4 MM MISC USE 2 TIMES DAILY AS DIRECTED , 1 PEN NEEDLE PER INJECTION.  Marland Kitchen Continuous Blood Gluc Sensor (FREESTYLE LIBRE SENSOR SYSTEM) MISC Check blood sugar 3 times a day. Dx:E11.8, Z79.4  . Continuous Blood Gluc Sensor MISC Check blood sugar 3 times a day- Dx: E11.8/Z79.4  . EASY COMFORT INSULIN SYRINGE 30G X 1/2" 0.5 ML MISC   .  gabapentin (NEURONTIN) 300 MG capsule Take 1 capsule 3 to 4 x / day for Painful Diabetic Neuropathy  . hyoscyamine (LEVSIN SL) 0.125 MG SL tablet Take 1 to 2 tablets 3 to 4 x day if needed for Nausea, vomiting, cramping or diarrhea  . insulin isophane & regular human (NOVOLIN 70/30 FLEXPEN RELION) (70-30) 100 UNIT/ML KwikPen Inject 18-24 units subcutaneously with breakfast and dinner as directed for diabetes/high blood sugar.  . lactulose (CHRONULAC) 10 GM/15ML solution Take 15 ml (3 tsp) once or twice daily as needed for constipation.  Marland Kitchen losartan (COZAAR) 25 MG tablet Take 1 tab daily  for blood pressure and kidney protection.  . Magnesium 400 MG TABS Take 1 tablet by mouth daily.  . Multiple Vitamins-Minerals (HAIR/SKIN/NAILS) TABS Take by mouth 2 (two) times daily.  Glory Rosebush VERIO test strip USE 1 STRIP TO CHECK GLUCOSE THREE TIMES DAILY AS DIRECTED  . rOPINIRole (REQUIP) 3 MG tablet Take 2 tablets   at Bedtime    for Restless Legs  . rosuvastatin (CRESTOR) 20 MG tablet TAKE 1 TABLET BY MOUTH ONCE DAILY FOR CHOLESTEROL  . senna-docusate (SENOKOT-S) 8.6-50 MG tablet Take 1 tablet by mouth daily.  . vitamin B-12 (CYANOCOBALAMIN) 1000 MCG tablet Take 1,000 mcg by mouth daily.  . vitamin C (ASCORBIC ACID) 500 MG tablet Take 500 mg by mouth daily.   No current facility-administered medications for this visit. (Other)      REVIEW OF SYSTEMS:    ALLERGIES Allergies  Allergen Reactions  . Morphine And Related Other (See Comments)    fuzzy feeling    PAST MEDICAL HISTORY Past Medical History:  Diagnosis Date  . Arthritis   . Chronic back pain   . Cystoid macular edema of right eye 08/19/2019  . Diabetic neuropathy ([place])   . Hypercholesteremia   . Hypertension   . Liver lesion   . Neuropathy   . Type II or unspecified type diabetes mellitus without mention of complication, not stated as uncontrolled   . Vitamin D deficiency   . Wears glasses    Past Surgical History:  Procedure Laterality Date  . ABDOMINAL HYSTERECTOMY    . APPENDECTOMY    . BACK SURGERY  1993   lumb lam-fusion  . BREAST EXCISIONAL BIOPSY Right 1967   benign  . CESAREAN SECTION     x 3   . COLONOSCOPY    . EYE SURGERY     both cataracts  . TRIGGER FINGER RELEASE  01/10/2012   Procedure: RELEASE TRIGGER FINGER/A-1 PULLEY;  Surgeon: Hessie Dibble, MD;  Location: [facility];  Service: Orthopedics;  Laterality: Right;  right ring trigger release    FAMILY HISTORY Family History  Problem Relation Age of Onset  . Liver disease Sister   . Diabetes Sister   . Colon  cancer Neg Hx     SOCIAL HISTORY Social History   Tobacco Use  . Smoking status: Never Smoker  . Smokeless tobacco: Never Used  Substance Use Topics  . Alcohol use: No  . Drug use: No         OPHTHALMIC EXAM:  Base Eye Exam    Visual Acuity (Snellen - Linear)      Right Left   Dist cc 20/200 -1 20/40 -1   Dist ph cc NI NI   Correction: Glasses       Tonometry (Tonopen, 2:15 PM)      Right Left   Pressure 18 17  Pupils      Pupils Dark Light Shape React APD   Right PERRL 2 2 Round Minimal None   Left PERRL 2 2 Round Sluggish None       Extraocular Movement      Right Left    Full Full       Neuro/Psych    Oriented x3: Yes   Mood/Affect: Normal       Dilation    Both eyes: 1.0% Mydriacyl, 2.5% Phenylephrine @ 2:15 PM        Slit Lamp and Fundus Exam    External Exam      Right Left   External Normal Normal       Slit Lamp Exam      Right Left   Lids/Lashes Normal Normal   Conjunctiva/Sclera White and quiet White and quiet   Cornea Clear Clear   Anterior Chamber Deep and quiet Deep and quiet   Iris Round and reactive Round and reactive   Lens Posterior chamber intraocular lens Posterior chamber intraocular lens   Anterior Vitreous Normal Normal       Fundus Exam      Right Left   Posterior Vitreous Posterior vitreous detachment Posterior vitreous detachment   Disc Normal Normal   C/D Ratio 0.4 0.45   Macula Geographic atrophy 3 DA size, Advanced age related macular degeneration, no macular thickening, Retinal pigment epithelial mottling, no exudates Geographic atrophy 2 DA size, Advanced age related macular degeneration, no macular thickening, Retinal pigment epithelial mottling, reddish hue in the parafoveal region, not in the FAZ   Vessels Normal Normal   Periphery Normal Normal          IMAGING AND PROCEDURES  Imaging and Procedures for 05/05/20  OCT, Retina - OU - Both Eyes       Right Eye Quality was good. Scan  locations included subfoveal. Central Foveal Thickness: 291. Findings include no IRF, abnormal foveal contour, no SRF, disciform scar.   Left Eye Quality was good. Central Foveal Thickness: 246. Progression has been stable. Findings include abnormal foveal contour, no SRF, no IRF, disciform scar.   Notes No signs of active CN VM OS, OD                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 degeneration of left eye with subfoveal involvement Stable acuity OS, yet with symptoms.  We will continue to monitor closely and asked the patient follow-up visit with [place] in 3 weeks for OCT and dilate OU.  Advanced nonexudative age-related macular degeneration of right eye with subfoveal involvement No signs of active CN VM.  Stable acuity  Cystoid macular edema of right eye No active findings resolved      ICD-10-CM   1. Exudative age-related macular degeneration of right eye with active choroidal neovascularization (HCC)  H35.3211 OCT, Retina - OU - Both Eyes  2. Advanced nonexudative age-related macular degeneration of left eye with subfoveal involvement  H35.3124   3. Advanced nonexudative age-related macular degeneration of right eye with subfoveal involvement  H35.3114   4. Cystoid macular edema of right eye  H35.351     1.  No signs of active CME nor maculopathy OU.  Dry atrophic changes present OU. 2. eyelid matting together in the mornings, we reassured the patient that using some artificial teardrops at night should prevent this upon awakening next day.  3.  Ophthalmic Meds Ordered this visit:  No orders of the defined types were placed in this  encounter.      Return in about 3 weeks (around 05/26/2020) for DILATE OU, OCT.  Patient Instructions  Patient instructed to contact the office promptly for new onset visual cue to declines distortions or changes    Explained the diagnoses, plan, and follow up with the patient and they expressed understanding.   Patient expressed understanding of the importance of proper follow up care.   Clent Demark Chandler Swiderski M.D. Diseases & Surgery of the Retina and Vitreous Retina & Diabetic [place] 05/05/20     Abbreviations: M myopia (nearsighted); A astigmatism; H hyperopia (farsighted); P presbyopia; Mrx spectacle prescription;  CTL contact lenses; OD right eye; OS left eye; OU both eyes  XT exotropia; ET esotropia; PEK punctate epithelial keratitis; PEE punctate epithelial erosions; DES dry eye syndrome; MGD meibomian gland dysfunction; ATs artificial tears; PFAT's preservative free artificial tears; [place] nuclear sclerotic cataract; PSC posterior subcapsular cataract; ERM epi-retinal membrane; PVD posterior vitreous detachment; RD retinal detachment; DM diabetes mellitus; DR diabetic retinopathy; NPDR non-proliferative diabetic retinopathy; PDR proliferative diabetic retinopathy; CSME clinically significant macular edema; DME diabetic macular edema; dbh dot blot hemorrhages; CWS cotton wool spot; POAG primary open angle glaucoma; C/D cup-to-disc ratio; HVF humphrey visual field; GVF goldmann visual field; OCT optical coherence tomography; IOP intraocular pressure; BRVO Branch retinal vein occlusion; CRVO central retinal vein occlusion; CRAO central retinal artery occlusion; BRAO branch retinal artery occlusion; RT retinal tear; SB scleral buckle; PPV pars plana vitrectomy; VH Vitreous hemorrhage; PRP panretinal laser photocoagulation; IVK intravitreal kenalog; VMT vitreomacular traction; MH Macular hole;  NVD neovascularization of the disc; NVE neovascularization elsewhere; AREDS age related eye disease study; ARMD age related macular degeneration; POAG primary open angle glaucoma; EBMD epithelial/anterior basement membrane dystrophy; ACIOL anterior chamber intraocular lens; IOL intraocular lens; PCIOL posterior chamber intraocular lens; Phaco/IOL phacoemulsification with intraocular lens placement; [place] photorefractive  keratectomy; LASIK laser assisted in situ keratomileusis; HTN hypertension; DM diabetes mellitus; COPD chronic obstructive pulmonary disease

## 2020-05-05 NOTE — Patient Instructions (Signed)
Patient instructed to contact the office promptly for new onset visual cue to declines distortions or changes

## 2020-05-05 NOTE — Assessment & Plan Note (Signed)
No active findings resolved

## 2020-05-05 NOTE — Assessment & Plan Note (Signed)
Stable acuity OS, yet with symptoms.  We will continue to monitor closely and asked the patient follow-up visit with [place] in 3 weeks for OCT and dilate OU.

## 2020-05-05 NOTE — Assessment & Plan Note (Signed)
No signs of active CN VM.  Stable acuity

## 2020-05-12 DIAGNOSIS — M545 Low back pain, unspecified: Secondary | ICD-10-CM | POA: Diagnosis not present

## 2020-05-12 DIAGNOSIS — M5442 Lumbago with sciatica, left side: Secondary | ICD-10-CM | POA: Diagnosis not present

## 2020-05-21 ENCOUNTER — Other Ambulatory Visit: Payer: Self-pay

## 2020-05-21 ENCOUNTER — Ambulatory Visit (INDEPENDENT_AMBULATORY_CARE_PROVIDER_SITE_OTHER): Payer: Medicare Other | Admitting: Internal Medicine

## 2020-05-21 VITALS — BP 144/64 | HR 62 | Temp 97.2°F | Resp 16 | Ht 63.0 in | Wt 129.8 lb

## 2020-05-21 DIAGNOSIS — N1832 Chronic kidney disease, stage 3b: Secondary | ICD-10-CM

## 2020-05-21 DIAGNOSIS — Z794 Long term (current) use of insulin: Secondary | ICD-10-CM | POA: Diagnosis not present

## 2020-05-21 DIAGNOSIS — E785 Hyperlipidemia, unspecified: Secondary | ICD-10-CM

## 2020-05-21 DIAGNOSIS — I10 Essential (primary) hypertension: Secondary | ICD-10-CM | POA: Diagnosis not present

## 2020-05-21 DIAGNOSIS — E1122 Type 2 diabetes mellitus with diabetic chronic kidney disease: Secondary | ICD-10-CM

## 2020-05-21 DIAGNOSIS — E11319 Type 2 diabetes mellitus with unspecified diabetic retinopathy without macular edema: Secondary | ICD-10-CM | POA: Diagnosis not present

## 2020-05-21 DIAGNOSIS — Z79899 Other long term (current) drug therapy: Secondary | ICD-10-CM | POA: Diagnosis not present

## 2020-05-21 DIAGNOSIS — E1169 Type 2 diabetes mellitus with other specified complication: Secondary | ICD-10-CM | POA: Diagnosis not present

## 2020-05-21 DIAGNOSIS — I700 Atherosclerosis of aorta: Secondary | ICD-10-CM | POA: Diagnosis not present

## 2020-05-21 DIAGNOSIS — E559 Vitamin D deficiency, unspecified: Secondary | ICD-10-CM | POA: Diagnosis not present

## 2020-05-21 NOTE — Patient Instructions (Addendum)

## 2020-05-21 NOTE — Progress Notes (Signed)
History of Present Illness:       This very nice 87 y.o. 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 Abdominal CT scan on 03.09/2017 found Aortic Atherosclerosis.      Patient is treated for HTN  (1996) & BP has been controlled at home. Today's  .  In 2009, patient had a Negative Cardiolite scan. Patient has had no complaints of any cardiac type chest pain, palpitations, dyspnea / orthopnea / PND, dizziness, claudication, or dependent edema.      Hyperlipidemia is controlled with diet & meds. Patient denies myalgias or other med SE's. Last Lipids were at goal except elevated Trig's:  Lab Results  Component Value Date   CHOL 168 12/23/2019   HDL 58 12/23/2019   LDLCALC 73 12/23/2019   TRIG 280 (H) 12/23/2019   CHOLHDL 2.9 12/23/2019    Also, the patient has history of T2_NIDDM (1996) w/CKD3b (YFV49). She was initiated on Insulin in 2000.   Patient has had no symptoms of reactive hypoglycemia, diabetic polys, paresthesias or visual blurring.  Patient is followed by Dr's [facility] for her Diabetic Retinopathy with ongoing intraocular injections.  She admits frequently eating fast foods.  It's speculated that patient's comprehension & insight is poor and she  is not compliant with her meds as her last A1c was not at goal:  Lab Results  Component Value Date   HGBA1C 10.4 (H) 05/21/2020        Further, the patient also has history of Vitamin D Deficiency and supplements vitamin D without any suspected side-effects. Last vitamin D was at goal:  Lab Results  Component Value Date   VD25OH 78 08/30/2019    Current Outpatient Medications on File Prior to Visit  Medication Sig  . acetaminophen (TYLENOL) 500 MG tablet Take 1,000  3 times daily as needed.  Marland Kitchen aspirin 81 MG tablet Take  daily.  . bisoprolol-hctz 5-6.25 MG tablet Take 1 tablet daily.  Marland Kitchen buPROPion XL 150 MG  Take 1 tablet every Morning   . VITAMIN D  2000 UNITS TABS Take 1 capsule  2  times daily.  . Continuous Blood Gluc Sensor  Check blood sugar 3 times a day- Dx: E11.8/Z79.4  . gabapentin  300 MG capsule Take 1 capsule 3 to 4 x / day   . hyoscyamine SL 0.125 MG SL tablet Take 1 to 2 tablets 3 to 4 x day if needed   . NOVOLIN 70/30 KwikPen Inject 18-24 units sq with breakfast and dinner   . lactulose  10 GM/15ML soln Take 15 ml \\once  or twice daily as needed  . XALATAN  ophth soln   . losartan 25 MG tablet Take 1 tab daily  . Magnesium 400 MG TABS Take 1 tablet  daily.  . Multiple Vitamins-Minerals  Take  2 times daily.  Marland Kitchen rOPINIRole 3 MG tablet Take 2 tablets   at Bedtime     . Rosuvastatin 20 MG tablet TAKE 1 TABLET  DAILY  . SENOKOT-S 8.6-50 MG tablet Take 1 tablet  daily.  . vitamin B-12  1000 MCG tablet Take daily.  . vitamin C 500 MG tablet Take daily.     Allergies  Allergen Reactions  . Morphine And Related Other (See Comments)    fuzzy feeling    PMHx:   Past Medical History:  Diagnosis Date  . Arthritis   . Chronic back pain   .  Cystoid macular edema of right eye 08/19/2019  . Diabetic neuropathy ([place])   . Hypercholesteremia   . Hypertension   . Liver lesion   . Neuropathy   . Type II or unspecified type diabetes mellitus without mention of complication, not stated as uncontrolled   . Vitamin D deficiency   . Wears glasses     Immunization History  Administered Date(s) Administered  . DT (Pediatric) 01/04/2011  . Influenza Split 01/23/2013  . Influenza, High Dose Seasonal PF 02/27/2014, 03/11/2015, 12/21/2015, 01/30/2017, 02/08/2018, 01/02/2019  . Influenza-Unspecified 02/03/2020  . PFIZER(Purple Top)SARS-COV-2 Vaccination 07/22/2019, 08/13/2019  . Pneumococcal Conjugate-13 02/27/2014  . Pneumococcal Polysaccharide-23 01/06/2012    Past Surgical History:  Procedure Laterality Date  . ABDOMINAL HYSTERECTOMY    . APPENDECTOMY    . BACK SURGERY  1993   lumb lam-fusion  . BREAST EXCISIONAL BIOPSY  Right 1967   benign  . CESAREAN SECTION     x 3   . COLONOSCOPY    . EYE SURGERY     both cataracts  . TRIGGER FINGER RELEASE  01/10/2012   Procedure: RELEASE TRIGGER FINGER/A-1 PULLEY;  Surgeon: Hessie Dibble, MD;  Location: [place];  Service: Orthopedics;  Laterality: Right;  right ring trigger release    FHx:    Reviewed / unchanged  SHx:    Reviewed / unchanged   Systems Review:  Constitutional: Denies fever, chills, wt changes, headaches, insomnia, fatigue, night sweats, change in appetite. Eyes: Denies redness, blurred vision, diplopia, discharge, itchy, watery eyes.  ENT: Denies discharge, congestion, post nasal drip, epistaxis, sore throat, earache, hearing loss, dental pain, tinnitus, vertigo, sinus pain, snoring.  CV: Denies chest pain, palpitations, irregular heartbeat, syncope, dyspnea, diaphoresis, orthopnea, PND, claudication or edema. Respiratory: denies cough, dyspnea, DOE, pleurisy, hoarseness, laryngitis, wheezing.  Gastrointestinal: Denies dysphagia, odynophagia, heartburn, reflux, water brash, abdominal pain or cramps, nausea, vomiting, bloating, diarrhea, constipation, hematemesis, melena, hematochezia  or hemorrhoids. Genitourinary: Denies dysuria, frequency, urgency, nocturia, hesitancy, discharge, hematuria or flank pain. Musculoskeletal: Denies arthralgias, myalgias, stiffness, jt. swelling, pain, limping or strain/sprain.  Skin: Denies pruritus, rash, hives, warts, acne, eczema or change in skin lesion(s). Neuro: No weakness, tremor, incoordination, spasms, paresthesia or pain. Psychiatric: Denies confusion, memory loss or sensory loss. Endo: Denies change in weight, skin or hair change.  Heme/Lymph: No excessive bleeding, bruising or enlarged lymph nodes.  Physical Exam  There were no vitals taken for this visit.  Appears  well nourished, well groomed  and in no distress.  Eyes: PERRLA, EOMs, conjunctiva no swelling or  erythema. Sinuses: No frontal/maxillary tenderness ENT/Mouth: EAC's clear, TM's nl w/o erythema, bulging. Nares clear w/o erythema, swelling, exudates. Oropharynx clear without erythema or exudates. Oral hygiene is good. Tongue normal, non obstructing. Hearing intact.  Neck: Supple. Thyroid not palpable. Car 2+/2+ without bruits, nodes or JVD. Chest: Respirations nl with BS clear & equal w/o rales, rhonchi, wheezing or stridor.  Cor: Heart sounds normal w/ regular rate and rhythm without sig. murmurs, gallops, clicks or rubs. Peripheral pulses normal and equal  without edema.  Abdomen: Soft & bowel sounds normal. Non-tender w/o guarding, rebound, hernias, masses or organomegaly.  Lymphatics: Unremarkable.  Musculoskeletal: Full ROM all peripheral extremities, joint stability, 5/5 strength and normal gait.  Skin: Warm, dry without exposed rashes, lesions or ecchymosis apparent.  Neuro: Cranial nerves intact, reflexes equal bilaterally. Sensory-motor testing grossly intact. Tendon reflexes grossly intact.  Pysch: Alert & oriented x 3.  Insight and judgement nl & appropriate.  No ideations.  Assessment and Plan:  1. Essential hypertension  - Continue medication, monitor blood pressure at home.  - Continue DASH diet.  Reminder to go to the ER if any CP,  SOB, nausea, dizziness, severe HA, changes vision/speech.  - COMPLETE METABOLIC PANEL WITH GFR - Magnesium - TSH - CBC with Differential/Platelet  2. Hyperlipidemia associated with type 2 diabetes mellitus ([place])  - Continue diet/meds, exercise,& lifestyle modifications.  - Continue monitor periodic cholesterol/liver & renal functions   - Lipid panel - TSH  3. Type 2 diabetes mellitus with stage 3b chronic kidney disease, with long-term current use of insulin (HCC)  - Hemoglobin A1c  4. Diabetic retinopathy of both eyes associated with type 2 diabetes mellitus, macular edema presence unspecified, unspecified retinopathy severity  ([place])  - Continue diet, exercise  - Lifestyle modifications.  - Monitor appropriate labs. - Continue supplementation.  - Hemoglobin A1c  5. Vitamin D deficiency  - VITAMIN D 25 Hydroxy   6. Atherosclerosis of aorta ([place]) by AbdCTscan on 06/28/2017  - Lipid panel  7. Medication management  - COMPLETE METABOLIC PANEL WITH GFR - Magnesium - Lipid panel - TSH - Hemoglobin A1c - VITAMIN D 25 Hydroxy - CBC with Differential/Platelet         Discussed  regular exercise, BP monitoring, weight control to achieve/maintain BMI less than 25 and discussed med and SE's. Recommended labs to assess and monitor clinical status with further disposition pending results of labs.  I discussed the assessment and treatment plan with the patient. The patient was provided an opportunity to ask questions and all were answered. The patient agreed with the plan and demonstrated an understanding of the instructions.  I provided over 30 minutes of exam, counseling, chart review and  complex critical decision making.         The patient was advised to call back or seek an in-person evaluation if the symptoms worsen or if the condition fails to improve as anticipated.   Kirtland Bouchard, MD

## 2020-05-22 LAB — CBC WITH DIFFERENTIAL/PLATELET
Absolute Monocytes: 710 cells/uL (ref 200–950)
Basophils Absolute: 60 cells/uL (ref 0–200)
Basophils Relative: 0.6 %
Eosinophils Absolute: 290 cells/uL (ref 15–500)
Eosinophils Relative: 2.9 %
HCT: 39.5 % (ref 35.0–45.0)
Hemoglobin: 13.4 g/dL (ref 11.7–15.5)
Lymphs Abs: 2350 cells/uL (ref 850–3900)
MCH: 32.4 pg (ref 27.0–33.0)
MCHC: 33.9 g/dL (ref 32.0–36.0)
MCV: 95.6 fL (ref 80.0–100.0)
MPV: 12.1 fL (ref 7.5–12.5)
Monocytes Relative: 7.1 %
Neutro Abs: 6590 cells/uL (ref 1500–7800)
Neutrophils Relative %: 65.9 %
Platelets: 281 10*3/uL (ref 140–400)
RBC: 4.13 10*6/uL (ref 3.80–5.10)
RDW: 11.8 % (ref 11.0–15.0)
Total Lymphocyte: 23.5 %
WBC: 10 10*3/uL (ref 3.8–10.8)

## 2020-05-22 LAB — COMPLETE METABOLIC PANEL WITH GFR
AG Ratio: 1.3 (calc) (ref 1.0–2.5)
ALT: 15 U/L (ref 6–29)
AST: 21 U/L (ref 10–35)
Albumin: 3.9 g/dL (ref 3.6–5.1)
Alkaline phosphatase (APISO): 82 U/L (ref 37–153)
BUN/Creatinine Ratio: 12 (calc) (ref 6–22)
BUN: 16 mg/dL (ref 7–25)
CO2: 30 mmol/L (ref 20–32)
Calcium: 9.8 mg/dL (ref 8.6–10.4)
Chloride: 99 mmol/L (ref 98–110)
Creat: 1.35 mg/dL — ABNORMAL HIGH (ref 0.60–0.88)
GFR, Est African American: 41 mL/min/{1.73_m2} — ABNORMAL LOW (ref >=60)
GFR, Est Non African American: 35 mL/min/{1.73_m2} — ABNORMAL LOW (ref >=60)
Globulin: 3.1 g/dL (calc) (ref 1.9–3.7)
Glucose, Bld: 306 mg/dL — ABNORMAL HIGH (ref 65–99)
Potassium: 5.1 mmol/L (ref 3.5–5.3)
Sodium: 140 mmol/L (ref 135–146)
Total Bilirubin: 0.4 mg/dL (ref 0.2–1.2)
Total Protein: 7 g/dL (ref 6.1–8.1)

## 2020-05-22 LAB — LIPID PANEL
Cholesterol: 253 mg/dL — ABNORMAL HIGH (ref <200)
HDL: 45 mg/dL — ABNORMAL LOW (ref >=50)
LDL Cholesterol (Calc): 157 mg/dL (calc) — ABNORMAL HIGH
Non-HDL Cholesterol (Calc): 208 mg/dL (calc) — ABNORMAL HIGH (ref <130)
Total CHOL/HDL Ratio: 5.6 (calc) — ABNORMAL HIGH (ref <5.0)
Triglycerides: 330 mg/dL — ABNORMAL HIGH (ref <150)

## 2020-05-22 LAB — HEMOGLOBIN A1C
Hgb A1c MFr Bld: 10.4 % of total Hgb — ABNORMAL HIGH (ref <5.7)
Mean Plasma Glucose: 252 mg/dL
eAG (mmol/L): 13.9 mmol/L

## 2020-05-22 LAB — VITAMIN D 25 HYDROXY (VIT D DEFICIENCY, FRACTURES): Vit D, 25-Hydroxy: 60 ng/mL (ref 30–100)

## 2020-05-22 LAB — TSH: TSH: 0.9 mIU/L (ref 0.40–4.50)

## 2020-05-22 LAB — MAGNESIUM: Magnesium: 1.8 mg/dL (ref 1.5–2.5)

## 2020-05-23 ENCOUNTER — Encounter: Payer: Self-pay | Admitting: Internal Medicine

## 2020-05-23 NOTE — Progress Notes (Signed)
============================================================ ============================================================  -    Glucose at OV was 306 mg%  !       And  . . . . . . . . . . .  - A1c  = 10.4% is still way to high       (Ideal or Goal is less than 6% )  - Recommend that you add 5 units more insulin to each meal   Increasing                      Up  to   Bkfst       22 u --->>>      27 units   Lunch     20 u --->>>       25 units   Supper   20 u --->>>       25 units  ============================================================ ============================================================  -  Kidney functions appear significantly from Stage 4 back to Stage 3b - Great ! ============================================================ ============================================================  -   Magnesium  -   1.8   -  very  low- goal is betw 2.0 - 2.5,   - So..............Marland Kitchen  Recommend that you take  Magnesium 500 mg tablet daily   - also important to eat lots of  leafy green vegetables   - spinach - Kale - collards - greens - okra - asparagus  - broccoli - quinoa - squash - almonds   - black, red, white beans  -  peas - green beans ============================================================ ============================================================  -  Chol = 253 -  and LDL Chol = 157 - Both Very elevated   - Recommend a stricter low cholesterol diet   - Cholesterol only comes from animal sources  - ie. meat, dairy, egg yolks  - Eat all the vegetables you want.  - Avoid meat, especially red meat - Beef AND Pork .  - Avoid cheese & dairy - milk & ice cream.     - Cheese is the most concentrated form of trans-fats which  is the worst thing to clog up our arteries.   - Veggie cheese is OK which can be found in the fresh  produce section at [employer] or [employer] or  [employer] ============================================================ ============================================================  -  Vitamin D = 60 - Great ! ============================================================ ============================================================  -  All Else - CBC - Electrolytes - Liver - & Thyroid  - all  Normal / OK ============================================================

## 2020-05-25 ENCOUNTER — Encounter: Payer: Self-pay | Admitting: *Deleted

## 2020-05-25 DIAGNOSIS — M47816 Spondylosis without myelopathy or radiculopathy, lumbar region: Secondary | ICD-10-CM | POA: Diagnosis not present

## 2020-05-25 DIAGNOSIS — M5416 Radiculopathy, lumbar region: Secondary | ICD-10-CM | POA: Diagnosis not present

## 2020-05-25 DIAGNOSIS — M5442 Lumbago with sciatica, left side: Secondary | ICD-10-CM | POA: Diagnosis not present

## 2020-05-25 DIAGNOSIS — Z981 Arthrodesis status: Secondary | ICD-10-CM | POA: Diagnosis not present

## 2020-05-26 ENCOUNTER — Ambulatory Visit (INDEPENDENT_AMBULATORY_CARE_PROVIDER_SITE_OTHER): Payer: Medicare Other | Admitting: Ophthalmology

## 2020-05-26 ENCOUNTER — Encounter (INDEPENDENT_AMBULATORY_CARE_PROVIDER_SITE_OTHER): Payer: Self-pay | Admitting: Ophthalmology

## 2020-05-26 ENCOUNTER — Other Ambulatory Visit: Payer: Self-pay

## 2020-05-26 DIAGNOSIS — H353124 Nonexudative age-related macular degeneration, left eye, advanced atrophic with subfoveal involvement: Secondary | ICD-10-CM

## 2020-05-26 DIAGNOSIS — H353211 Exudative age-related macular degeneration, right eye, with active choroidal neovascularization: Secondary | ICD-10-CM

## 2020-05-26 DIAGNOSIS — H353222 Exudative age-related macular degeneration, left eye, with inactive choroidal neovascularization: Secondary | ICD-10-CM | POA: Diagnosis not present

## 2020-05-26 DIAGNOSIS — H353212 Exudative age-related macular degeneration, right eye, with inactive choroidal neovascularization: Secondary | ICD-10-CM

## 2020-05-26 NOTE — Progress Notes (Signed)
05/26/2020     CHIEF COMPLAINT Patient presents for Retina Follow Up (3 Week f\u OU. OCT/Pt states she does not see well. Pt c/o blurry NVA.)   HISTORY OF PRESENT ILLNESS: [name] is a 87 y.o. female who presents to the clinic today for:   HPI    Retina Follow Up    Patient presents with  Wet AMD.  In right eye.  Severity is moderate.  Duration of 3 weeks.  Since onset it is stable.  I, the attending physician,  performed the HPI with the patient and updated documentation appropriately. Additional comments: 3 Week f\u OU. OCT Pt states she does not see well. Pt c/o blurry NVA.       Last edited by Tilda Franco on 05/26/2020  2:31 PM. (History)      Referring physician: [name], MD [address] [place],  [place] [address]  HISTORICAL INFORMATION:   Selected notes from the MEDICAL RECORD NUMBER    Lab Results  Component Value Date   HGBA1C 10.4 (H) 05/21/2020     CURRENT MEDICATIONS: Current Outpatient Medications (Ophthalmic Drugs)  Medication Sig  . dorzolamide-timolol (COSOPT) 22.3-6.8 MG/ML ophthalmic solution   . latanoprost (XALATAN) 0.005 % ophthalmic solution   . LOTEMAX 0.5 % GEL    No current facility-administered medications for this visit. (Ophthalmic Drugs)   Current Outpatient Medications (Other)  Medication Sig  . acetaminophen (TYLENOL) 500 MG tablet Take 500-1,000 mg by mouth 3 (three) times daily as needed.  Marland Kitchen aspirin 81 MG tablet Take 81 mg by mouth daily.  . ASSURE COMFORT LANCETS 30G MISC Check blood sugar 3 times daily-DX-E11.22  . bisoprolol-hydrochlorothiazide (ZIAC) 5-6.25 MG tablet Take 1 tablet by mouth daily.  . Blood Glucose Calibration (ACCU-CHEK AVIVA) SOLN Check blood sugar 3 times daily-DX-E11.22  . blood glucose meter kit and supplies KIT Test blood sugar three times a day for Diabetes. Dx:E11.8/ Z79.4 .  Marland Kitchen blood glucose meter kit and supplies KIT Dispense based on patient and insurance preference.  Use up to three times daily as directed. Dx:  . buPROPion (WELLBUTRIN XL) 150 MG 24 hr tablet Take 1 tablet every Morning for Mood , Focus & Concentration (Patient not taking: Reported on 05/21/2020)  . Cholecalciferol (VITAMIN D3) 2000 UNITS TABS Take 1 capsule by mouth 2 (two) times daily.  . COMFORT EZ PEN NEEDLES 32G X 4 MM MISC USE 2 TIMES DAILY AS DIRECTED , 1 PEN NEEDLE PER INJECTION.  Marland Kitchen Continuous Blood Gluc Sensor (FREESTYLE LIBRE SENSOR SYSTEM) MISC Check blood sugar 3 times a day. Dx:E11.8, Z79.4  . Continuous Blood Gluc Sensor MISC Check blood sugar 3 times a day- Dx: E11.8/Z79.4  . EASY COMFORT INSULIN SYRINGE 30G X 1/2" 0.5 ML MISC   . gabapentin (NEURONTIN) 300 MG capsule Take 1 capsule 3 to 4 x / day for Painful Diabetic Neuropathy  . hyoscyamine (LEVSIN SL) 0.125 MG SL tablet Take 1 to 2 tablets 3 to 4 x day if needed for Nausea, vomiting, cramping or diarrhea (Patient not taking: Reported on 05/21/2020)  . insulin isophane & regular human (NOVOLIN 70/30 FLEXPEN RELION) (70-30) 100 UNIT/ML KwikPen Inject 18-24 units subcutaneously with breakfast and dinner as directed for diabetes/high blood sugar.  . lactulose (CHRONULAC) 10 GM/15ML solution Take 15 ml (3 tsp) once or twice daily as needed for constipation.  Marland Kitchen losartan (COZAAR) 25 MG tablet Take 1 tab daily for blood pressure and kidney protection. (Patient not taking:  Reported on 05/21/2020)  . Magnesium 400 MG TABS Take 1 tablet by mouth daily.  . Multiple Vitamins-Minerals (HAIR/SKIN/NAILS) TABS Take by mouth 2 (two) times daily.  Glory Rosebush VERIO test strip USE 1 STRIP TO CHECK GLUCOSE THREE TIMES DAILY AS DIRECTED  . rOPINIRole (REQUIP) 3 MG tablet Take 2 tablets   at Bedtime    for Restless Legs  . rosuvastatin (CRESTOR) 20 MG tablet TAKE 1 TABLET BY MOUTH ONCE DAILY FOR CHOLESTEROL  . senna-docusate (SENOKOT-S) 8.6-50 MG tablet Take 1 tablet by mouth daily.  . vitamin B-12 (CYANOCOBALAMIN) 1000 MCG tablet Take 1,000 mcg by  mouth daily.  . vitamin C (ASCORBIC ACID) 500 MG tablet Take 500 mg by mouth daily.  Marland Kitchen zinc gluconate 50 MG tablet Take 50 mg by mouth daily.   No current facility-administered medications for this visit. (Other)      REVIEW OF SYSTEMS:    ALLERGIES Allergies  Allergen Reactions  . Morphine And Related Other (See Comments)    fuzzy feeling    PAST MEDICAL HISTORY Past Medical History:  Diagnosis Date  . Arthritis   . Chronic back pain   . Cystoid macular edema of right eye 08/19/2019  . Diabetic neuropathy ([place])   . Hypercholesteremia   . Hypertension   . Liver lesion   . Neuropathy   . Type II or unspecified type diabetes mellitus without mention of complication, not stated as uncontrolled   . Vitamin D deficiency   . Wears glasses    Past Surgical History:  Procedure Laterality Date  . ABDOMINAL HYSTERECTOMY    . APPENDECTOMY    . BACK SURGERY  1993   lumb lam-fusion  . BREAST EXCISIONAL BIOPSY Right 1967   benign  . CESAREAN SECTION     x 3   . COLONOSCOPY    . EYE SURGERY     both cataracts  . TRIGGER FINGER RELEASE  01/10/2012   Procedure: RELEASE TRIGGER FINGER/A-1 PULLEY;  Surgeon: Hessie Dibble, MD;  Location: [facility];  Service: Orthopedics;  Laterality: Right;  right ring trigger release    FAMILY HISTORY Family History  Problem Relation Age of Onset  . Liver disease Sister   . Diabetes Sister   . Colon cancer Neg Hx     SOCIAL HISTORY Social History   Tobacco Use  . Smoking status: Never Smoker  . Smokeless tobacco: Never Used  Substance Use Topics  . Alcohol use: No  . Drug use: No         OPHTHALMIC EXAM:  Base Eye Exam    Visual Acuity (Snellen - Linear)      Right Left   Dist cc 20/400 20/80 -1   Dist ph cc 20/200 20/60 -1   Correction: Glasses       Tonometry (Tonopen, 2:35 PM)      Right Left   Pressure 11 14       Pupils      Pupils Dark Light Shape React APD   Right PERRL 3 2 Round Brisk  None   Left PERRL 3 2 Round Brisk None       Visual Fields (Counting fingers)      Left Right    Full    Restrictions  Partial outer inferior nasal deficiency       Neuro/Psych    Oriented x3: Yes   Mood/Affect: Normal       Dilation    Both eyes: 1.0% Mydriacyl, 2.5%  Phenylephrine @ 2:35 PM        Slit Lamp and Fundus Exam    External Exam      Right Left   External Normal Normal       Slit Lamp Exam      Right Left   Lids/Lashes Normal Normal   Conjunctiva/Sclera White and quiet White and quiet   Cornea Clear Clear   Anterior Chamber Deep and quiet Deep and quiet   Iris Round and reactive Round and reactive   Lens Posterior chamber intraocular lens Posterior chamber intraocular lens   Anterior Vitreous Normal Normal       Fundus Exam      Right Left   Posterior Vitreous Posterior vitreous detachment Posterior vitreous detachment   Disc Normal Normal   C/D Ratio 0.4 0.45   Macula Geographic atrophy 3 DA size, Advanced age related macular degeneration, no macular thickening, Retinal pigment epithelial mottling, no exudates Geographic atrophy 2 DA size, Advanced age related macular degeneration, no macular thickening, Retinal pigment epithelial mottling, reddish hue in the parafoveal region, not in the FAZ   Vessels Normal Normal   Periphery Normal Normal          IMAGING AND PROCEDURES  Imaging and Procedures for 05/26/20  OCT, Retina - OU - Both Eyes       Right Eye Quality was good. Scan locations included subfoveal. Central Foveal Thickness: 291. Progression has been stable. Findings include no IRF, abnormal foveal contour, no SRF, disciform scar, central retinal atrophy, outer retinal atrophy, inner retinal atrophy.   Left Eye Quality was good. Scan locations included subfoveal. Central Foveal Thickness: 246. Findings include abnormal foveal contour, no SRF, no IRF, disciform scar, central retinal atrophy, inner retinal atrophy, outer retinal atrophy.    Notes No signs of active CNVM OS, OD                    ASSESSMENT/PLAN:  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) No signs of recurrence of CNVM OS  Exudative age-related macular degeneration of right eye with inactive choroidal neovascularization (HCC)  no signs of CNVM OD  Advanced nonexudative age-related macular degeneration of left eye with subfoveal involvement Dry eye progressive atrophy accounts for acuity OU      ICD-10-CM   1. Exudative age-related macular degeneration of right eye with active choroidal neovascularization (HCC)  H35.3211 OCT, Retina - OU - Both Eyes  2. 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)  H35.3222   3. Exudative age-related macular degeneration of right eye with inactive choroidal neovascularization (HCC)  H35.3212   4. Advanced nonexudative age-related macular degeneration of left eye with subfoveal involvement  H35.3124     1.  OU we will continue to monitor to look for signs of active CNVM currently without CNVM will observe  2.  History of CNVM in the past OD, last treatment OD was July 2021 stable since that time  3.  Ophthalmic Meds Ordered this visit:  No orders of the defined types were placed in this encounter.      Return in about 8 weeks (around 07/21/2020) for DILATE OU, OCT.  There are no Patient Instructions on file for this visit.   Explained the diagnoses, plan, and follow up with the patient and they expressed understanding.  Patient expressed understanding of the importance of proper follow up care.   Alford Highland Rankin M.D. Diseases & Surgery  of the Retina and Vitreous Retina & Diabetic [facility] 05/26/20     Abbreviations: M myopia (nearsighted); A astigmatism; H hyperopia (farsighted); P presbyopia; Mrx spectacle prescription;  CTL contact lenses; OD right eye; OS left eye; OU  both eyes  XT exotropia; ET esotropia; PEK punctate epithelial keratitis; PEE punctate epithelial erosions; DES dry eye syndrome; MGD meibomian gland dysfunction; ATs artificial tears; PFAT's preservative free artificial tears; [place] nuclear sclerotic cataract; PSC posterior subcapsular cataract; ERM epi-retinal membrane; PVD posterior vitreous detachment; RD retinal detachment; DM diabetes mellitus; DR diabetic retinopathy; NPDR non-proliferative diabetic retinopathy; PDR proliferative diabetic retinopathy; CSME clinically significant macular edema; DME diabetic macular edema; dbh dot blot hemorrhages; CWS cotton wool spot; POAG primary open angle glaucoma; C/D cup-to-disc ratio; HVF humphrey visual field; GVF goldmann visual field; OCT optical coherence tomography; IOP intraocular pressure; BRVO Branch retinal vein occlusion; CRVO central retinal vein occlusion; CRAO central retinal artery occlusion; BRAO branch retinal artery occlusion; RT retinal tear; SB scleral buckle; PPV pars plana vitrectomy; VH Vitreous hemorrhage; PRP panretinal laser photocoagulation; IVK intravitreal kenalog; VMT vitreomacular traction; MH Macular hole;  NVD neovascularization of the disc; NVE neovascularization elsewhere; AREDS age related eye disease study; ARMD age related macular degeneration; POAG primary open angle glaucoma; EBMD epithelial/anterior basement membrane dystrophy; ACIOL anterior chamber intraocular lens; IOL intraocular lens; PCIOL posterior chamber intraocular lens; Phaco/IOL phacoemulsification with intraocular lens placement; [place] photorefractive keratectomy; LASIK laser assisted in situ keratomileusis; HTN hypertension; DM diabetes mellitus; COPD chronic obstructive pulmonary disease

## 2020-05-26 NOTE — Assessment & Plan Note (Signed)
Dry eye progressive atrophy accounts for acuity OU

## 2020-05-26 NOTE — Assessment & Plan Note (Signed)
no signs of CNVM OD

## 2020-05-26 NOTE — Assessment & Plan Note (Signed)
No signs of recurrence of CNVM OS

## 2020-05-28 ENCOUNTER — Other Ambulatory Visit: Payer: Self-pay | Admitting: Neurosurgery

## 2020-05-28 DIAGNOSIS — M5416 Radiculopathy, lumbar region: Secondary | ICD-10-CM

## 2020-05-29 ENCOUNTER — Ambulatory Visit
Admission: RE | Admit: 2020-05-29 | Discharge: 2020-05-29 | Disposition: A | Discharge status: Home / Self Care | Payer: Medicare Other | Source: Ambulatory Visit | Attending: Neurosurgery | Admitting: Neurosurgery

## 2020-05-29 ENCOUNTER — Other Ambulatory Visit: Payer: Self-pay

## 2020-05-29 DIAGNOSIS — M47817 Spondylosis without myelopathy or radiculopathy, lumbosacral region: Secondary | ICD-10-CM | POA: Diagnosis not present

## 2020-05-29 DIAGNOSIS — M5416 Radiculopathy, lumbar region: Secondary | ICD-10-CM

## 2020-05-29 MED ORDER — IOPAMIDOL (ISOVUE-M 200) INJECTION 41%
1.0000 mL | Freq: Once | INTRAMUSCULAR | Status: AC
  Administered 2020-05-29: 1 mL via EPIDURAL

## 2020-05-29 MED ORDER — METHYLPREDNISOLONE ACETATE 40 MG/ML INJ SUSP (RADIOLOG
120.0000 mg | Freq: Once | INTRAMUSCULAR | Status: AC
  Administered 2020-05-29: 120 mg via EPIDURAL

## 2020-05-29 NOTE — Discharge Instructions (Signed)

## 2020-06-01 ENCOUNTER — Other Ambulatory Visit: Payer: Self-pay | Admitting: Neurosurgery

## 2020-06-02 ENCOUNTER — Telehealth: Payer: Self-pay | Admitting: *Deleted

## 2020-06-02 NOTE — Telephone Encounter (Signed)
Called and confirmed [facility] had received the order for the patient's diabetic supplies. Per the representative, the order is received and will be processed.

## 2020-06-04 ENCOUNTER — Telehealth: Payer: Self-pay

## 2020-06-04 ENCOUNTER — Telehealth: Payer: Self-pay | Admitting: *Deleted

## 2020-06-04 NOTE — Telephone Encounter (Signed)
Patient states that she is very concerned with her blood sugar since Monday. Is so high that it doesn't register on the glucometer. No appointments available at this time.  I spoke with Mr. Allisa, [place] wasn't home at the time. Provider aware and instructed patient to check blood sugar tonight and if it is still not registering because its too high then to inject an extra 10 units on top of what she usually takes. Instructed to call in the morning after she checks her blood sugar for further instructions.

## 2020-06-04 NOTE — Telephone Encounter (Signed)
Faxed a note to [employer], explaining their representative, on 02/082022, confirmed the order had been received and would be processed.

## 2020-06-05 NOTE — Telephone Encounter (Signed)
Blood sugar this morning was 335. Patient took 30 units of insulin.

## 2020-06-08 NOTE — Telephone Encounter (Signed)
Spoke with patient and she states that yesterday fasting BS was 191, 18units of insulin given. In the evening, BS was 294, 24 units were given. Took it again later that evening and BS was 213, 18? Units given. This morning, BS was 107. Patient was instructed by provider to take the larger dose of Insulin in the morning instead of the evening. Take 25 units in the morning and 15-20 units in the evening. Patient has a follow up appointment scheduled for 2/23 with Kyra.

## 2020-06-11 NOTE — Telephone Encounter (Signed)
Tried to reach patient. Unable to leave a message.

## 2020-06-16 NOTE — Progress Notes (Signed)
Assessment and Plan:  [name] was seen today for diabetes.  Diagnoses and all orders for this visit:  Poorly controlled type 2 diabetes mellitus ([place]) Type 2 diabetes mellitus with complication, with long-term current use of insulin ([place]) Continue close monitoring, food logging Discussed ideally should increase AM insulin to avoid elevated pre dinner glucose Advised increase AM insulin by 2 units every 3 days pre dinner glucose is 180+ Take 10-15 units with lunch only if high carb meal (sandwhich, pasta), etc per log review typically causes 300+ pre dinner check  - given sugar log and advised how to fill it and to bring it at next appt  - given foot care handout and explained the principles  - given instructions for hypoglycemia management  Remember a low sugar is much more dangerous than a high sugar.  She will need to follow up in 2-4 weeks;  TRY TO ADD ON WALKING, START LOW AT 20 MINS 3 DAYS A WEEK -     insulin isophane & regular human (NOVOLIN 70/30 FLEXPEN RELION) (70-30) 100 UNIT/ML KwikPen; Inject 20-24 units subcutaneously with breakfast and 18-20 units dinner as directed for diabetes/high blood sugar. Take additional 10-15 units with lunch only if high carb meal.  Infected wound of left calf Poor healing likely complicated by hyperglycemia  CKD 4, sent in doxycycline Get on zinc, vit C, D, increase walking Monitor and follow up if not resolving -     doxycycline (VIBRAMYCIN) 100 MG capsule; Take 1 capsule twice daily with food  Further disposition pending results of labs. Discussed med's effects and SE's.   Over 15 minutes of exam, counseling, chart review, and critical decision making was performed.   Future Appointments  Date Time Provider [place]  07/21/2020  2:00 PM Rankin, Clent Demark, MD [facility] None  09/03/2020  2:00 PM Rankin, Clent Demark, MD RDE-RDE None  09/09/2020  2:00 PM Liane Comber, NP [facility] None     ------------------------------------------------------------------------------------------------------------------   HPI BP (!) 150/60   Pulse 66   Temp (!) 97.3 F (36.3 C)   Wt 131 lb (59.4 kg)   SpO2 99%   BMI 23.21 kg/m   87 y.o.female presents for follow up on poorly controlled T2DM and need for insulin adjustment. She is also concerned about persistent would to left medical calf, with erythematous borders, following injury (can't recall how but was several months ago).   Her husband contacted office Re: severe hyperglycemia 06/04/2020, wasn't registering on glucometer, onset after she had spinal injection by ortho. Was taking 18-20 units Novolin 70/30 BID. She was advised to take additional 10 units  That evening and call back. Unfortunately poorly compliant, but eventually followed up reporting some fasting values in 80s and was advised to reduce night time insulin.   She presents with glucose/food/insulin log; labile; fasting ranging 83-203, she will take 80-20 units, then if eats a noon meal intermittently takes 18 units; checks prior to dinner, typically 180+, up to 400, and takes 20 units in the evening.    Lab Results  Component Value Date   HGBA1C 10.4 (H) 05/21/2020   This SmartLink has not been configured with any valid records.    Past Medical History:  Diagnosis Date  . Arthritis   . Chronic back pain   . Cystoid macular edema of right eye 08/19/2019  . Diabetic neuropathy ([place])   . Hypercholesteremia   . Hypertension   . Liver lesion   . Neuropathy   . Type II or  unspecified type diabetes mellitus without mention of complication, not stated as uncontrolled   . Vitamin D deficiency   . Wears glasses      Allergies  Allergen Reactions  . Morphine And Related Other (See Comments)    fuzzy feeling    Current Outpatient Medications on File Prior to Visit  Medication Sig  . acetaminophen (TYLENOL) 500 MG tablet Take 500-1,000 mg by mouth 3 (three)  times daily as needed.  Marland Kitchen aspirin 81 MG tablet Take 81 mg by mouth daily.  . ASSURE COMFORT LANCETS 30G MISC Check blood sugar 3 times daily-DX-E11.22  . bisoprolol-hydrochlorothiazide (ZIAC) 5-6.25 MG tablet Take 1 tablet by mouth daily.  . Blood Glucose Calibration (ACCU-CHEK AVIVA) SOLN Check blood sugar 3 times daily-DX-E11.22  . blood glucose meter kit and supplies KIT Test blood sugar three times a day for Diabetes. Dx:E11.8/ Z79.4 .  Marland Kitchen blood glucose meter kit and supplies KIT Dispense based on patient and insurance preference. Use up to three times daily as directed. Dx:  . buPROPion (WELLBUTRIN XL) 150 MG 24 hr tablet Take 1 tablet every Morning for Mood , Focus & Concentration  . Cholecalciferol (VITAMIN D3) 2000 UNITS TABS Take 1 capsule by mouth 2 (two) times daily.  . COMFORT EZ PEN NEEDLES 32G X 4 MM MISC USE 2 TIMES DAILY AS DIRECTED , 1 PEN NEEDLE PER INJECTION.  Marland Kitchen Continuous Blood Gluc Sensor (FREESTYLE LIBRE SENSOR SYSTEM) MISC Check blood sugar 3 times a day. Dx:E11.8, Z79.4  . Continuous Blood Gluc Sensor MISC Check blood sugar 3 times a day- Dx: E11.8/Z79.4  . dorzolamide-timolol (COSOPT) 22.3-6.8 MG/ML ophthalmic solution   . EASY COMFORT INSULIN SYRINGE 30G X 1/2" 0.5 ML MISC   . gabapentin (NEURONTIN) 300 MG capsule Take 1 capsule 3 to 4 x / day for Painful Diabetic Neuropathy  . hyoscyamine (LEVSIN SL) 0.125 MG SL tablet Take 1 to 2 tablets 3 to 4 x day if needed for Nausea, vomiting, cramping or diarrhea  . lactulose (CHRONULAC) 10 GM/15ML solution Take 15 ml (3 tsp) once or twice daily as needed for constipation.  Marland Kitchen latanoprost (XALATAN) 0.005 % ophthalmic solution   . losartan (COZAAR) 25 MG tablet Take 1 tab daily for blood pressure and kidney protection.  . LOTEMAX 0.5 % GEL   . Magnesium 400 MG TABS Take 1 tablet by mouth daily.  . Multiple Vitamins-Minerals (HAIR/SKIN/NAILS) TABS Take by mouth 2 (two) times daily.  Glory Rosebush VERIO test strip USE 1 STRIP TO CHECK  GLUCOSE THREE TIMES DAILY AS DIRECTED  . rOPINIRole (REQUIP) 3 MG tablet Take 2 tablets   at Bedtime    for Restless Legs  . rosuvastatin (CRESTOR) 20 MG tablet TAKE 1 TABLET BY MOUTH ONCE DAILY FOR CHOLESTEROL  . senna-docusate (SENOKOT-S) 8.6-50 MG tablet Take 1 tablet by mouth daily.  . vitamin B-12 (CYANOCOBALAMIN) 1000 MCG tablet Take 1,000 mcg by mouth daily.  . vitamin C (ASCORBIC ACID) 500 MG tablet Take 500 mg by mouth daily.  Marland Kitchen zinc gluconate 50 MG tablet Take 50 mg by mouth daily.   No current facility-administered medications on file prior to visit.    ROS: all negative except above.   Physical Exam:  BP (!) 150/60   Pulse 66   Temp (!) 97.3 F (36.3 C)   Wt 131 lb (59.4 kg)   SpO2 99%   BMI 23.21 kg/m   General Appearance: Well nourished, well dressed elder female in no apparent distress. Eyes: PERRLA,  conjunctiva no swelling or erythema ENT/Mouth: Ext aud canals clear, TMs without erythema, bulging. No erythema, swelling, or exudate on post pharynx.  Tonsils not swollen or erythematous. Hearing normal.  Neck: Supple, thyroid normal.  Respiratory: Respiratory effort normal, BS equal bilaterally without rales, rhonchi, wheezing or stridor.  Cardio: RRR with no MRGs. Brisk peripheral pulses without edema.  Abdomen: Soft, + BS.  Non tender, no guarding, rebound, hernias, masses. Lymphatics: Non tender without lymphadenopathy.  Musculoskeletal: no obvious deformity, symmetrical strength, normal gait.  Skin: Warm, dry; she has open wound approx 2 cm x 0.8 cm with 2cm erythematous border to left medial calf, dried purulent discharge Neuro: Normal muscle tone Psych: Awake and oriented X 3, normal affect, Insight and Judgment poor    Izora Ribas, NP 5:39 PM [facility]

## 2020-06-17 ENCOUNTER — Other Ambulatory Visit: Payer: Self-pay

## 2020-06-17 ENCOUNTER — Encounter: Payer: Self-pay | Admitting: Adult Health

## 2020-06-17 ENCOUNTER — Ambulatory Visit (INDEPENDENT_AMBULATORY_CARE_PROVIDER_SITE_OTHER): Payer: Medicare Other | Admitting: Adult Health

## 2020-06-17 ENCOUNTER — Ambulatory Visit: Payer: Medicare Other | Admitting: Adult Health Nurse Practitioner

## 2020-06-17 VITALS — BP 150/60 | HR 66 | Temp 97.3°F | Wt 131.0 lb

## 2020-06-17 DIAGNOSIS — N184 Chronic kidney disease, stage 4 (severe): Secondary | ICD-10-CM

## 2020-06-17 DIAGNOSIS — E1122 Type 2 diabetes mellitus with diabetic chronic kidney disease: Secondary | ICD-10-CM

## 2020-06-17 DIAGNOSIS — Z794 Long term (current) use of insulin: Secondary | ICD-10-CM

## 2020-06-17 DIAGNOSIS — E118 Type 2 diabetes mellitus with unspecified complications: Secondary | ICD-10-CM | POA: Diagnosis not present

## 2020-06-17 DIAGNOSIS — E1165 Type 2 diabetes mellitus with hyperglycemia: Secondary | ICD-10-CM

## 2020-06-17 MED ORDER — DOXYCYCLINE HYCLATE 100 MG PO CAPS: ORAL_CAPSULE | ORAL | 0 refills | Status: DC

## 2020-06-17 MED ORDER — NOVOLIN 70/30 FLEXPEN RELION (70-30) 100 UNIT/ML ~~LOC~~ SUPN
PEN_INJECTOR | SUBCUTANEOUS | 0 refills | Status: DC
Start: 2020-06-17 — End: 2020-12-23

## 2020-06-17 NOTE — Patient Instructions (Signed)
Do 20-24 units with breakfast  10-15 units only if eating high carb lunch  18-20 units with dinner  If prior to dinner check is persistently above 180- then increase morning insulin by another 2 units     Doxycycline tablets or capsules What is this medicine? DOXYCYCLINE (dox i SYE kleen) is a tetracycline antibiotic. It kills certain bacteria or stops their growth. It is used to treat many kinds of infections, like dental, skin, respiratory, and urinary tract infections. It also treats acne, Lyme disease, malaria, and certain sexually transmitted infections. This medicine may be used for other purposes; ask your health care provider or pharmacist if you have questions. COMMON BRAND NAME(S): Acticlate, Adoxa, Adoxa CK, Adoxa Pak, Adoxa TT, Alodox, Avidoxy, Doxal, LYMEPAK, Mondoxyne NL, Monodox, Morgidox 1x, Morgidox 1x Kit, Morgidox 2x, Morgidox 2x Kit, NutriDox, Ocudox, [place], [place], [place], Vibra-Tabs, Vibramycin What should I tell my health care provider before I take this medicine? They need to know if you have any of these conditions:  liver disease  long exposure to sunlight like working outdoors  stomach problems like colitis  an unusual or allergic reaction to doxycycline, tetracycline antibiotics, other medicines, foods, dyes, or preservatives  pregnant or trying to get pregnant  breast-feeding How should I use this medicine? Take this medicine by mouth with a full glass of water. Follow the directions on the prescription label. It is best to take this medicine without food, but if it upsets your stomach take it with food. Take your medicine at regular intervals. Do not take your medicine more often than directed. Take all of your medicine as directed even if you think you are better. Do not skip doses or stop your medicine early. Talk to your pediatrician regarding the use of this medicine in children. While this drug may be prescribed for selected conditions,  precautions do apply. Overdosage: If you think you have taken too much of this medicine contact a poison control center or emergency room at once. NOTE: This medicine is only for you. Do not share this medicine with others. What if I miss a dose? If you miss a dose, take it as soon as you can. If it is almost time for your next dose, take only that dose. Do not take double or extra doses. What may interact with this medicine?  antacids  barbiturates  birth control pills  bismuth subsalicylate  carbamazepine  methoxyflurane  other antibiotics  phenytoin  vitamins that contain iron  warfarin This list may not describe all possible interactions. Give your health care provider a list of all the medicines, herbs, non-prescription drugs, or dietary supplements you use. Also tell them if you smoke, drink alcohol, or use illegal drugs. Some items may interact with your medicine. What should I watch for while using this medicine? Tell your doctor or health care professional if your symptoms do not improve. Do not treat diarrhea with over the counter products. Contact your doctor if you have diarrhea that lasts more than 2 days or if it is severe and watery. Do not take this medicine just before going to bed. It may not dissolve properly when you lay down and can cause pain in your throat. Drink plenty of fluids while taking this medicine to also help reduce irritation in your throat. This medicine can make you more sensitive to the sun. Keep out of the sun. If you cannot avoid being in the sun, wear protective clothing and use sunscreen. Do not use sun lamps  or tanning beds/booths. Birth control pills may not work properly while you are taking this medicine. Talk to your doctor about using an extra method of birth control. If you are being treated for a sexually transmitted infection, avoid sexual contact until you have finished your treatment. Your sexual partner may also need  treatment. Avoid antacids, aluminum, calcium, magnesium, and iron products for 4 hours before and 2 hours after taking a dose of this medicine. If you are using this medicine to prevent malaria, you should still protect yourself from contact with mosquitos. Stay in screened-in areas, use mosquito nets, keep your body covered, and use an insect repellent. What side effects may I notice from receiving this medicine? Side effects that you should report to your doctor or health care professional as soon as possible:  allergic reactions like skin rash, itching or hives, swelling of the face, lips, or tongue  difficulty breathing  fever  itching in the rectal or genital area  pain on swallowing  rash, fever, and swollen lymph nodes  redness, blistering, peeling or loosening of the skin, including inside the mouth  severe stomach pain or cramps  unusual bleeding or bruising  unusually weak or tired  yellowing of the eyes or skin Side effects that usually do not require medical attention (report to your doctor or health care professional if they continue or are bothersome):  diarrhea  loss of appetite  nausea, vomiting This list may not describe all possible side effects. Call your doctor for medical advice about side effects. You may report side effects to FDA at 1-800-FDA-1088. Where should I keep my medicine? Keep out of the reach of children. Store at room temperature, below 30 degrees C (86 degrees F). Protect from light. Keep container tightly closed. Throw away any unused medicine after the expiration date. Taking this medicine after the expiration date can make you seriously ill. NOTE: This sheet is a summary. It may not cover all possible information. If you have questions about this medicine, talk to your doctor, pharmacist, or health care provider.  2021 Elsevier/Gold Standard (2018-07-12 13:44:53)

## 2020-06-23 ENCOUNTER — Other Ambulatory Visit: Payer: Self-pay | Admitting: Neurosurgery

## 2020-06-23 DIAGNOSIS — H401131 Primary open-angle glaucoma, bilateral, mild stage: Secondary | ICD-10-CM | POA: Diagnosis not present

## 2020-06-23 DIAGNOSIS — M5440 Lumbago with sciatica, unspecified side: Secondary | ICD-10-CM

## 2020-06-25 ENCOUNTER — Ambulatory Visit
Admission: RE | Admit: 2020-06-25 | Discharge: 2020-06-25 | Disposition: A | Discharge status: Home / Self Care | Payer: Medicare Other | Source: Ambulatory Visit | Attending: Neurosurgery | Admitting: Neurosurgery

## 2020-06-25 DIAGNOSIS — M48061 Spinal stenosis, lumbar region without neurogenic claudication: Secondary | ICD-10-CM | POA: Diagnosis not present

## 2020-06-25 DIAGNOSIS — M47816 Spondylosis without myelopathy or radiculopathy, lumbar region: Secondary | ICD-10-CM | POA: Diagnosis not present

## 2020-06-25 DIAGNOSIS — M5440 Lumbago with sciatica, unspecified side: Secondary | ICD-10-CM

## 2020-06-25 MED ORDER — IOPAMIDOL (ISOVUE-M 200) INJECTION 41%
1.0000 mL | Freq: Once | INTRAMUSCULAR | Status: AC
  Administered 2020-06-25: 1 mL via EPIDURAL

## 2020-06-25 MED ORDER — METHYLPREDNISOLONE ACETATE 40 MG/ML INJ SUSP (RADIOLOG
120.0000 mg | Freq: Once | INTRAMUSCULAR | Status: AC
  Administered 2020-06-25: 120 mg via EPIDURAL

## 2020-06-25 NOTE — Discharge Instructions (Signed)

## 2020-06-29 ENCOUNTER — Other Ambulatory Visit: Payer: Self-pay | Admitting: Adult Health

## 2020-07-15 NOTE — Progress Notes (Signed)
Assessment and Plan:  [name] was seen today for balance issues.  Diagnoses and all orders for this visit:  Unsteady gait/ At moderate risk for fall Normal neuro other than very diminished peripheral sensation and unsteady gait; normal cerebellar testing; back pain/lumbar spondylosis well controlled by recent injection at Dr. Melven Sartorius office. Neuropathy likely significant contributor to unsteady gait. Vision may be affecting as well. No falls but moderate risk - fall prevention reviewed.  Discussed and will refer for PT for gait/balance training with goal to prevent fall and injury.  She is not using cane despite recommendations; reviewed today and recommend she carry with her at all times if out of the home until can get in with PT. She is in agreement expresses understanding of my concern for injury.  Follow up as scheduled and as needed if any changes.   -     Ambulatory referral to Physical Therapy  Neuropathy due to type 2 diabetes mellitus ([place]) Dietary recommendations Physical Activity recommendations Check feet daily  - reviewed hypoglycemia management  Continue the 70/30 insulin with sliding scale as prescribed; continue close monitoring, bring log to next OV -     Ambulatory referral to Physical Therapy   Further disposition pending results of labs. Discussed med's effects and SE's.   Over 15 minutes of exam, counseling, chart review, and critical decision making was performed.   Future Appointments  Date Time Provider [place]  07/21/2020  2:00 PM Rankin, Clent Demark, MD RDE-RDE None  09/03/2020  2:00 PM Rankin, Clent Demark, MD RDE-RDE None  09/09/2020  2:00 PM Liane Comber, NP [facility] None    ------------------------------------------------------------------------------------------------------------------   HPI BP 140/68   Pulse 61   Temp (!) 97.2 F (36.2 C)   Wt 130 lb (59 kg)   SpO2 97%   BMI 23.03 kg/m   87 y.o.female with poorly controlled T2DM,  neuropathy, bil macular degeneration (Dr. Zadie Rhine following with injections), chronic back pain with radiculopathy (Dr. Vertell Limber following, doing back injections with good results) presents for unsteady gait.   She notes long history of unstable gait, but notes in recent months balance is getting worse, stumbling more. Denies any falls. Admits has been advised to use cane, will use intermittently but not consistently.   She denies fall, head injury, dizziness, fatigue; she has chronic neuropathy. Denies new sensory changes, extremity weakness.   She has been working on improving diet; fasting labile, this AM 87, 80s-low 200s, has sliding scale, novolin 70/30 20-24 units AM, 18-20 units PM. Denies hypoglycemia.  Lab Results  Component Value Date   HGBA1C 10.4 (H) 05/21/2020     Past Medical History:  Diagnosis Date  . Arthritis   . Chronic back pain   . Cystoid macular edema of right eye 08/19/2019  . Diabetic neuropathy ([place])   . Hypercholesteremia   . Hypertension   . Liver lesion   . Neuropathy   . Type II or unspecified type diabetes mellitus without mention of complication, not stated as uncontrolled   . Vitamin D deficiency   . Wears glasses      Allergies  Allergen Reactions  . Morphine And Related Other (See Comments)    fuzzy feeling    Current Outpatient Medications on File Prior to Visit  Medication Sig  . acetaminophen (TYLENOL) 500 MG tablet Take 500-1,000 mg by mouth 3 (three) times daily as needed.  Marland Kitchen aspirin 81 MG tablet Take 81 mg by mouth daily.  . ASSURE COMFORT LANCETS 30G MISC  Check blood sugar 3 times daily-DX-E11.22  . bisoprolol-hydrochlorothiazide (ZIAC) 5-6.25 MG tablet Take 1 tablet by mouth daily.  . Blood Glucose Calibration (ACCU-CHEK AVIVA) SOLN Check blood sugar 3 times daily-DX-E11.22  . blood glucose meter kit and supplies KIT Test blood sugar three times a day for Diabetes. Dx:E11.8/ Z79.4 .  Marland Kitchen blood glucose meter kit and supplies KIT Dispense  based on patient and insurance preference. Use up to three times daily as directed. Dx:  . buPROPion (WELLBUTRIN XL) 150 MG 24 hr tablet Take 1 tablet every Morning for Mood , Focus & Concentration  . Cholecalciferol (VITAMIN D3) 2000 UNITS TABS Take 1 capsule by mouth 2 (two) times daily.  . COMFORT EZ PEN NEEDLES 32G X 4 MM MISC USE 2 TIMES DAILY AS DIRECTED , 1 PEN NEEDLE PER INJECTION.  Marland Kitchen Continuous Blood Gluc Sensor (FREESTYLE LIBRE SENSOR SYSTEM) MISC Check blood sugar 3 times a day. Dx:E11.8, Z79.4  . Continuous Blood Gluc Sensor MISC Check blood sugar 3 times a day- Dx: E11.8/Z79.4  . dorzolamide-timolol (COSOPT) 22.3-6.8 MG/ML ophthalmic solution   . doxycycline (VIBRAMYCIN) 100 MG capsule TAKE 1 CAPSULE BY MOUTH TWICE DAILY WITH FOOD  . EASY COMFORT INSULIN SYRINGE 30G X 1/2" 0.5 ML MISC   . gabapentin (NEURONTIN) 300 MG capsule Take 1 capsule 3 to 4 x / day for Painful Diabetic Neuropathy  . hyoscyamine (LEVSIN SL) 0.125 MG SL tablet Take 1 to 2 tablets 3 to 4 x day if needed for Nausea, vomiting, cramping or diarrhea  . insulin isophane & regular human (NOVOLIN 70/30 FLEXPEN RELION) (70-30) 100 UNIT/ML KwikPen Inject 20-24 units subcutaneously with breakfast and 18-20 units dinner as directed for diabetes/high blood sugar. Take additional 10-15 units with lunch only if high carb meal.  . lactulose (CHRONULAC) 10 GM/15ML solution Take 15 ml (3 tsp) once or twice daily as needed for constipation.  Marland Kitchen latanoprost (XALATAN) 0.005 % ophthalmic solution   . losartan (COZAAR) 25 MG tablet Take 1 tab daily for blood pressure and kidney protection.  . LOTEMAX 0.5 % GEL   . Magnesium 400 MG TABS Take 1 tablet by mouth daily.  . Multiple Vitamins-Minerals (HAIR/SKIN/NAILS) TABS Take by mouth 2 (two) times daily.  Glory Rosebush VERIO test strip USE 1 STRIP TO CHECK GLUCOSE THREE TIMES DAILY AS DIRECTED  . rOPINIRole (REQUIP) 3 MG tablet Take 2 tablets   at Bedtime    for Restless Legs  . rosuvastatin  (CRESTOR) 20 MG tablet TAKE 1 TABLET BY MOUTH ONCE DAILY FOR CHOLESTEROL  . senna-docusate (SENOKOT-S) 8.6-50 MG tablet Take 1 tablet by mouth daily.  . vitamin B-12 (CYANOCOBALAMIN) 1000 MCG tablet Take 1,000 mcg by mouth daily.  . vitamin C (ASCORBIC ACID) 500 MG tablet Take 500 mg by mouth daily.  Marland Kitchen zinc gluconate 50 MG tablet Take 50 mg by mouth daily.   No current facility-administered medications on file prior to visit.    ROS: all negative except above.   Physical Exam:  BP 140/68   Pulse 61   Temp (!) 97.2 F (36.2 C)   Wt 130 lb (59 kg)   SpO2 97%   BMI 23.03 kg/m   General Appearance: Well nourished, well dressed elder female, in no apparent distress. Eyes: PERRLA, EOMs, conjunctiva no swelling or erythema Sinuses: No Frontal/maxillary tenderness ENT/Mouth: Ext aud canals clear, TMs without erythema, bulging. No erythema, swelling, or exudate on post pharynx.  Tonsils not swollen or erythematous. Hearing normal.  Neck: Supple, thyroid  normal.  Respiratory: Respiratory effort normal, BS equal bilaterally without rales, rhonchi, wheezing or stridor.  Cardio: RRR with no MRGs. Thready peripheral pulses without edema.  Abdomen: Soft, + BS.  Non tender, no guarding, rebound, hernias, masses. Lymphatics: Non tender without lymphadenopathy.  Musculoskeletal: 5/5 strength throughout lower extremities, symmetrical hip, knee, ankle ROM symmetrical and intact, unsteady wide based gait. Neg straight leg raise.  Skin: Warm, dry without rashes, lesions, ecchymosis.  Neuro: Cranial nerves intact. Normal muscle tone, no cerebellar symptoms. Neg Romberg, intact rapid alternating, heel to shin, nose to finger. Unsteady wide based gait. Sensation diminished in bil feet and ankles to monofilament and light touch. Patellar and ankle reflexes 1+ bil symmetrical.  Psych: Awake and oriented X 3, normal affect, Insight and Judgment fair.     Izora Ribas, NP 2:33 PM [facility]

## 2020-07-16 ENCOUNTER — Ambulatory Visit (INDEPENDENT_AMBULATORY_CARE_PROVIDER_SITE_OTHER): Payer: Medicare Other | Admitting: Adult Health

## 2020-07-16 ENCOUNTER — Encounter: Payer: Self-pay | Admitting: Adult Health

## 2020-07-16 ENCOUNTER — Other Ambulatory Visit: Payer: Self-pay

## 2020-07-16 VITALS — BP 140/68 | HR 61 | Temp 97.2°F | Wt 130.0 lb

## 2020-07-16 DIAGNOSIS — Z9181 History of falling: Secondary | ICD-10-CM | POA: Diagnosis not present

## 2020-07-16 DIAGNOSIS — E1140 Type 2 diabetes mellitus with diabetic neuropathy, unspecified: Secondary | ICD-10-CM | POA: Diagnosis not present

## 2020-07-16 DIAGNOSIS — R2681 Unsteadiness on feet: Secondary | ICD-10-CM

## 2020-07-16 NOTE — Patient Instructions (Addendum)

## 2020-07-21 ENCOUNTER — Encounter (INDEPENDENT_AMBULATORY_CARE_PROVIDER_SITE_OTHER): Payer: Self-pay | Admitting: Ophthalmology

## 2020-07-21 ENCOUNTER — Ambulatory Visit (INDEPENDENT_AMBULATORY_CARE_PROVIDER_SITE_OTHER): Payer: Medicare Other | Admitting: Ophthalmology

## 2020-07-21 ENCOUNTER — Other Ambulatory Visit: Payer: Self-pay

## 2020-07-21 DIAGNOSIS — H353211 Exudative age-related macular degeneration, right eye, with active choroidal neovascularization: Secondary | ICD-10-CM | POA: Diagnosis not present

## 2020-07-21 DIAGNOSIS — H353212 Exudative age-related macular degeneration, right eye, with inactive choroidal neovascularization: Secondary | ICD-10-CM | POA: Diagnosis not present

## 2020-07-21 DIAGNOSIS — H353124 Nonexudative age-related macular degeneration, left eye, advanced atrophic with subfoveal involvement: Secondary | ICD-10-CM

## 2020-07-21 DIAGNOSIS — H353114 Nonexudative age-related macular degeneration, right eye, advanced atrophic with subfoveal involvement: Secondary | ICD-10-CM

## 2020-07-21 NOTE — Assessment & Plan Note (Signed)
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

## 2020-07-21 NOTE — Assessment & Plan Note (Signed)

## 2020-07-21 NOTE — Progress Notes (Signed)
07/21/2020     CHIEF COMPLAINT Patient presents for Retina Follow Up (8 Week f\u OU. OCT/Pt states vision  has decreased. Pt states she has trouble with DVA and NVA. /BGL: 151/A1C: 10.4 in Jan)   HISTORY OF PRESENT ILLNESS: [name] is a 87 y.o. female who presents to the clinic today for:   HPI    Retina Follow Up    Patient presents with  Wet AMD.  In right eye.  Severity is moderate.  Duration of 8 weeks.  Since onset it is stable.  I, the attending physician,  performed the HPI with the patient and updated documentation appropriately. Additional comments: 8 Week f\u OU. OCT Pt states vision  has decreased. Pt states she has trouble with DVA and NVA.  BGL: 151 A1C: 10.4 in Jan       Last edited by Tilda Franco on 07/21/2020  2:07 PM. (History)      Referring physician: [name], MD [address] [place],  [place] [address]  HISTORICAL INFORMATION:   Selected notes from the MEDICAL RECORD NUMBER    Lab Results  Component Value Date   HGBA1C 10.4 (H) 05/21/2020     CURRENT MEDICATIONS: Current Outpatient Medications (Ophthalmic Drugs)  Medication Sig  . dorzolamide-timolol (COSOPT) 22.3-6.8 MG/ML ophthalmic solution   . latanoprost (XALATAN) 0.005 % ophthalmic solution   . LOTEMAX 0.5 % GEL    No current facility-administered medications for this visit. (Ophthalmic Drugs)   Current Outpatient Medications (Other)  Medication Sig  . acetaminophen (TYLENOL) 500 MG tablet Take 500-1,000 mg by mouth 3 (three) times daily as needed.  Marland Kitchen aspirin 81 MG tablet Take 81 mg by mouth daily.  . ASSURE COMFORT LANCETS 30G MISC Check blood sugar 3 times daily-DX-E11.22  . bisoprolol-hydrochlorothiazide (ZIAC) 5-6.25 MG tablet Take 1 tablet by mouth daily.  . Blood Glucose Calibration (ACCU-CHEK AVIVA) SOLN Check blood sugar 3 times daily-DX-E11.22  . blood glucose meter kit and supplies KIT Test blood sugar three times a day for Diabetes.  Dx:E11.8/ Z79.4 .  Marland Kitchen blood glucose meter kit and supplies KIT Dispense based on patient and insurance preference. Use up to three times daily as directed. Dx:  . buPROPion (WELLBUTRIN XL) 150 MG 24 hr tablet Take 1 tablet every Morning for Mood , Focus & Concentration  . Cholecalciferol (VITAMIN D3) 2000 UNITS TABS Take 1 capsule by mouth 2 (two) times daily.  . COMFORT EZ PEN NEEDLES 32G X 4 MM MISC USE 2 TIMES DAILY AS DIRECTED , 1 PEN NEEDLE PER INJECTION.  Marland Kitchen Continuous Blood Gluc Sensor (FREESTYLE LIBRE SENSOR SYSTEM) MISC Check blood sugar 3 times a day. Dx:E11.8, Z79.4  . Continuous Blood Gluc Sensor MISC Check blood sugar 3 times a day- Dx: E11.8/Z79.4  . doxycycline (VIBRAMYCIN) 100 MG capsule TAKE 1 CAPSULE BY MOUTH TWICE DAILY WITH FOOD  . EASY COMFORT INSULIN SYRINGE 30G X 1/2" 0.5 ML MISC   . gabapentin (NEURONTIN) 300 MG capsule Take 1 capsule 3 to 4 x / day for Painful Diabetic Neuropathy  . hyoscyamine (LEVSIN SL) 0.125 MG SL tablet Take 1 to 2 tablets 3 to 4 x day if needed for Nausea, vomiting, cramping or diarrhea  . insulin isophane & regular human (NOVOLIN 70/30 FLEXPEN RELION) (70-30) 100 UNIT/ML KwikPen Inject 20-24 units subcutaneously with breakfast and 18-20 units dinner as directed for diabetes/high blood sugar. Take additional 10-15 units with lunch only if high carb meal.  . lactulose (  CHRONULAC) 10 GM/15ML solution Take 15 ml (3 tsp) once or twice daily as needed for constipation.  Marland Kitchen losartan (COZAAR) 25 MG tablet Take 1 tab daily for blood pressure and kidney protection.  . Magnesium 400 MG TABS Take 1 tablet by mouth daily.  . Multiple Vitamins-Minerals (HAIR/SKIN/NAILS) TABS Take by mouth 2 (two) times daily.  Glory Rosebush VERIO test strip USE 1 STRIP TO CHECK GLUCOSE THREE TIMES DAILY AS DIRECTED  . rOPINIRole (REQUIP) 3 MG tablet Take 2 tablets   at Bedtime    for Restless Legs  . rosuvastatin (CRESTOR) 20 MG tablet TAKE 1 TABLET BY MOUTH ONCE DAILY FOR CHOLESTEROL   . senna-docusate (SENOKOT-S) 8.6-50 MG tablet Take 1 tablet by mouth daily.  . vitamin B-12 (CYANOCOBALAMIN) 1000 MCG tablet Take 1,000 mcg by mouth daily.  . vitamin C (ASCORBIC ACID) 500 MG tablet Take 500 mg by mouth daily.  Marland Kitchen zinc gluconate 50 MG tablet Take 50 mg by mouth daily.   No current facility-administered medications for this visit. (Other)      REVIEW OF SYSTEMS: ROS    Positive for: Endocrine   Last edited by Tilda Franco on 07/21/2020  2:07 PM. (History)       ALLERGIES Allergies  Allergen Reactions  . Morphine And Related Other (See Comments)    fuzzy feeling    PAST MEDICAL HISTORY Past Medical History:  Diagnosis Date  . Arthritis   . Chronic back pain   . Cystoid macular edema of right eye 08/19/2019  . Diabetic neuropathy ([place])   . Hypercholesteremia   . Hypertension   . Liver lesion   . Neuropathy   . Type II or unspecified type diabetes mellitus without mention of complication, not stated as uncontrolled   . Vitamin D deficiency   . Wears glasses    Past Surgical History:  Procedure Laterality Date  . ABDOMINAL HYSTERECTOMY    . APPENDECTOMY    . BACK SURGERY  1993   lumb lam-fusion  . BREAST EXCISIONAL BIOPSY Right 1967   benign  . CESAREAN SECTION     x 3   . COLONOSCOPY    . EYE SURGERY     both cataracts  . TRIGGER FINGER RELEASE  01/10/2012   Procedure: RELEASE TRIGGER FINGER/A-1 PULLEY;  Surgeon: Hessie Dibble, MD;  Location: [facility];  Service: Orthopedics;  Laterality: Right;  right ring trigger release    FAMILY HISTORY Family History  Problem Relation Age of Onset  . Liver disease Sister   . Diabetes Sister   . Colon cancer Neg Hx     SOCIAL HISTORY Social History   Tobacco Use  . Smoking status: Never Smoker  . Smokeless tobacco: Never Used  Substance Use Topics  . Alcohol use: No  . Drug use: No         OPHTHALMIC EXAM: Base Eye Exam    Visual Acuity (Snellen - Linear)       Right Left   Dist cc 20/200 20/60 -2   Dist ph cc 20/100 -1 20/50 -2   Correction: Glasses       Tonometry (Tonopen, 2:13 PM)      Right Left   Pressure 18 18       Pupils      Pupils Dark Light Shape React APD   Right PERRL 2 1 Round Brisk None   Left PERRL 2 1 Round Brisk None       Visual Fields (  Counting fingers)      Left Right    Full Full       Neuro/Psych    Oriented x3: Yes   Mood/Affect: Normal       Dilation    Both eyes: 1.0% Mydriacyl, 2.5% Phenylephrine @ 2:13 PM        Slit Lamp and Fundus Exam    External Exam      Right Left   External Normal Normal       Slit Lamp Exam      Right Left   Lids/Lashes Normal Normal   Conjunctiva/Sclera White and quiet White and quiet   Cornea Clear Clear   Anterior Chamber Deep and quiet Deep and quiet   Iris Round and reactive Round and reactive   Lens Posterior chamber intraocular lens Posterior chamber intraocular lens   Anterior Vitreous Normal Normal       Fundus Exam      Right Left   Posterior Vitreous Posterior vitreous detachment Posterior vitreous detachment   Disc Normal Normal   C/D Ratio 0.4 0.45   Macula Geographic atrophy 3 DA size, Advanced age related macular degeneration, no macular thickening, Retinal pigment epithelial mottling, no exudates Geographic atrophy 2 DA size, Advanced age related macular degeneration, no macular thickening, Retinal pigment epithelial mottling, reddish hue in the parafoveal region, not in the FAZ   Vessels Normal Normal   Periphery Normal Normal          IMAGING AND PROCEDURES  Imaging and Procedures for 07/21/20  OCT, Retina - OU - Both Eyes       Right Eye Quality was good. Scan locations included subfoveal. Central Foveal Thickness: 259. Progression has been stable. Findings include no IRF, abnormal foveal contour, no SRF, disciform scar, central retinal atrophy, outer retinal atrophy, inner retinal atrophy.   Left Eye Quality was good. Scan  locations included subfoveal. Central Foveal Thickness: 242. Findings include abnormal foveal contour, no SRF, no IRF, disciform scar, central retinal atrophy, inner retinal atrophy, outer retinal atrophy.   Notes No signs of active CNVM OS, OD                    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 degeneration of left eye with subfoveal involvement The nature of dry age related macular degeneration was discussed with the patient as well as its possible conversion to wet. The results of the AREDS 2 study was discussed with the patient. A diet rich in dark leafy green vegetables was advised and specific recommendations were made regarding supplements with AREDS 2 formulation . Control of hypertension and serum cholesterol may slow the disease. Smoking cessation is mandatory to slow the disease and diminish the risk of progressing to wet age related macular degeneration. The patient was instructed in the use of an [place] and was told to return immediately for any changes in the Grid. Stressed to the patient do not rub eyes  Advanced nonexudative age-related macular degeneration of right eye with subfoveal involvement The nature of dry age related macular degeneration was discussed with the patient as well as its possible conversion to wet. The results of the AREDS 2 study was discussed with the patient. A diet rich in dark leafy green vegetables was advised and specific recommendations were made regarding supplements with AREDS 2 formulation . Control of hypertension and serum cholesterol may slow the disease. Smoking cessation is mandatory to slow the disease and diminish the risk of progressing to  wet age related macular degeneration. The patient was instructed in the use of an [place] and was told to return immediately for any changes in the Grid. Stressed to the patient do not rub eyes  Exudative age-related macular degeneration of right eye with inactive  choroidal neovascularization ([place]) No signs of recurrence of CNVM in either eye      ICD-10-CM   1. Exudative age-related macular degeneration of right eye with active choroidal neovascularization (HCC)  H35.3211 OCT, Retina - OU - Both Eyes  2. Advanced nonexudative age-related macular degeneration of left eye with subfoveal involvement  H35.3124   3. Advanced nonexudative age-related macular degeneration of right eye with subfoveal involvement  H35.3114   4. Exudative age-related macular degeneration of right eye with inactive choroidal neovascularization ([place])  H35.3212     1.  No signs of active CNVM in either eye.  Continues to be stable.  Last injection required right eye was July 2021.  2.  Geographic atrophy splitting the FAZ in the left eye accounts for acuity and subfoveal in the right eye accounts for acuity no change in the last interval  3.  Ophthalmic Meds Ordered this visit:  No orders of the defined types were placed in this encounter.      Return in about 6 months (around 01/21/2021) for DILATE OU, COLOR FP, OCT.  There are no Patient Instructions on file for this visit.   Explained the diagnoses, plan, and follow up with the patient and they expressed understanding.  Patient expressed understanding of the importance of proper follow up care.   Clent Demark Marsha Hillman M.D. Diseases & Surgery of the Retina and Vitreous Retina & Diabetic [facility] 07/21/20     Abbreviations: M myopia (nearsighted); A astigmatism; H hyperopia (farsighted); P presbyopia; Mrx spectacle prescription;  CTL contact lenses; OD right eye; OS left eye; OU both eyes  XT exotropia; ET esotropia; PEK punctate epithelial keratitis; PEE punctate epithelial erosions; DES dry eye syndrome; MGD meibomian gland dysfunction; ATs artificial tears; PFAT's preservative free artificial tears; [place] nuclear sclerotic cataract; PSC posterior subcapsular cataract; ERM epi-retinal membrane; PVD posterior vitreous  detachment; RD retinal detachment; DM diabetes mellitus; DR diabetic retinopathy; NPDR non-proliferative diabetic retinopathy; PDR proliferative diabetic retinopathy; CSME clinically significant macular edema; DME diabetic macular edema; dbh dot blot hemorrhages; CWS cotton wool spot; POAG primary open angle glaucoma; C/D cup-to-disc ratio; HVF humphrey visual field; GVF goldmann visual field; OCT optical coherence tomography; IOP intraocular pressure; BRVO Branch retinal vein occlusion; CRVO central retinal vein occlusion; CRAO central retinal artery occlusion; BRAO branch retinal artery occlusion; RT retinal tear; SB scleral buckle; PPV pars plana vitrectomy; VH Vitreous hemorrhage; PRP panretinal laser photocoagulation; IVK intravitreal kenalog; VMT vitreomacular traction; MH Macular hole;  NVD neovascularization of the disc; NVE neovascularization elsewhere; AREDS age related eye disease study; ARMD age related macular degeneration; POAG primary open angle glaucoma; EBMD epithelial/anterior basement membrane dystrophy; ACIOL anterior chamber intraocular lens; IOL intraocular lens; PCIOL posterior chamber intraocular lens; Phaco/IOL phacoemulsification with intraocular lens placement; [place] photorefractive keratectomy; LASIK laser assisted in situ keratomileusis; HTN hypertension; DM diabetes mellitus; COPD chronic obstructive pulmonary disease

## 2020-07-24 ENCOUNTER — Other Ambulatory Visit: Payer: Self-pay | Admitting: Internal Medicine

## 2020-07-24 DIAGNOSIS — G2581 Restless legs syndrome: Secondary | ICD-10-CM

## 2020-07-24 DIAGNOSIS — E1140 Type 2 diabetes mellitus with diabetic neuropathy, unspecified: Secondary | ICD-10-CM

## 2020-07-27 ENCOUNTER — Telehealth: Payer: Self-pay | Admitting: Adult Health

## 2020-07-27 DIAGNOSIS — M6281 Muscle weakness (generalized): Secondary | ICD-10-CM | POA: Diagnosis not present

## 2020-07-27 DIAGNOSIS — R262 Difficulty in walking, not elsewhere classified: Secondary | ICD-10-CM | POA: Diagnosis not present

## 2020-07-27 DIAGNOSIS — R2681 Unsteadiness on feet: Secondary | ICD-10-CM | POA: Diagnosis not present

## 2020-07-27 NOTE — Telephone Encounter (Signed)
patient called to request dermatology referral for crustly spot on nose, to be evaluated. She scratched a crustly spot on nose and it began bleeding.

## 2020-07-28 NOTE — Telephone Encounter (Signed)
Unable to reach patient. No voicemail available.

## 2020-08-03 DIAGNOSIS — R2681 Unsteadiness on feet: Secondary | ICD-10-CM | POA: Diagnosis not present

## 2020-08-03 DIAGNOSIS — M6281 Muscle weakness (generalized): Secondary | ICD-10-CM | POA: Diagnosis not present

## 2020-08-03 DIAGNOSIS — R262 Difficulty in walking, not elsewhere classified: Secondary | ICD-10-CM | POA: Diagnosis not present

## 2020-08-05 DIAGNOSIS — R262 Difficulty in walking, not elsewhere classified: Secondary | ICD-10-CM | POA: Diagnosis not present

## 2020-08-05 DIAGNOSIS — M6281 Muscle weakness (generalized): Secondary | ICD-10-CM | POA: Diagnosis not present

## 2020-08-05 DIAGNOSIS — R2681 Unsteadiness on feet: Secondary | ICD-10-CM | POA: Diagnosis not present

## 2020-08-14 DIAGNOSIS — R233 Spontaneous ecchymoses: Secondary | ICD-10-CM | POA: Diagnosis not present

## 2020-08-14 DIAGNOSIS — L570 Actinic keratosis: Secondary | ICD-10-CM | POA: Diagnosis not present

## 2020-08-14 DIAGNOSIS — C44321 Squamous cell carcinoma of skin of nose: Secondary | ICD-10-CM | POA: Diagnosis not present

## 2020-08-14 DIAGNOSIS — L821 Other seborrheic keratosis: Secondary | ICD-10-CM | POA: Diagnosis not present

## 2020-08-18 ENCOUNTER — Other Ambulatory Visit: Payer: Self-pay | Admitting: *Deleted

## 2020-08-18 ENCOUNTER — Other Ambulatory Visit: Payer: Self-pay

## 2020-08-18 DIAGNOSIS — E118 Type 2 diabetes mellitus with unspecified complications: Secondary | ICD-10-CM

## 2020-08-18 MED ORDER — BLOOD GLUCOSE MONITORING SUPPL DEVI: 0 refills | Status: DC

## 2020-08-18 MED ORDER — BLOOD GLUCOSE MONITOR KIT: PACK | 0 refills | Status: DC

## 2020-08-18 MED ORDER — ONETOUCH VERIO FLEX SYSTEM W/DEVICE KIT: PACK | 0 refills | Status: DC

## 2020-09-02 ENCOUNTER — Ambulatory Visit (HOSPITAL_COMMUNITY)
Admission: EM | Admit: 2020-09-02 | Discharge: 2020-09-02 | Disposition: A | Discharge status: Home / Self Care | Payer: Medicare Other | Source: Home / Self Care

## 2020-09-02 ENCOUNTER — Encounter (HOSPITAL_COMMUNITY): Payer: Self-pay

## 2020-09-02 ENCOUNTER — Telehealth: Payer: Self-pay

## 2020-09-02 ENCOUNTER — Encounter (HOSPITAL_COMMUNITY): Payer: Self-pay | Admitting: Medical Oncology

## 2020-09-02 ENCOUNTER — Other Ambulatory Visit: Payer: Self-pay

## 2020-09-02 ENCOUNTER — Emergency Department (HOSPITAL_COMMUNITY)
Admission: EM | Admit: 2020-09-02 | Discharge: 2020-09-02 | Disposition: A | Discharge status: Home / Self Care | Payer: Medicare Other | Attending: Emergency Medicine | Admitting: Emergency Medicine

## 2020-09-02 DIAGNOSIS — Z20822 Contact with and (suspected) exposure to covid-19: Secondary | ICD-10-CM | POA: Insufficient documentation

## 2020-09-02 DIAGNOSIS — R059 Cough, unspecified: Secondary | ICD-10-CM | POA: Insufficient documentation

## 2020-09-02 DIAGNOSIS — J029 Acute pharyngitis, unspecified: Secondary | ICD-10-CM | POA: Insufficient documentation

## 2020-09-02 DIAGNOSIS — R070 Pain in throat: Secondary | ICD-10-CM | POA: Diagnosis not present

## 2020-09-02 DIAGNOSIS — Z5321 Procedure and treatment not carried out due to patient leaving prior to being seen by health care provider: Secondary | ICD-10-CM | POA: Insufficient documentation

## 2020-09-02 DIAGNOSIS — J069 Acute upper respiratory infection, unspecified: Secondary | ICD-10-CM

## 2020-09-02 DIAGNOSIS — I10 Essential (primary) hypertension: Secondary | ICD-10-CM | POA: Insufficient documentation

## 2020-09-02 LAB — CBC WITH DIFFERENTIAL/PLATELET
Abs Immature Granulocytes: 0.03 10*3/uL (ref 0.00–0.07)
Basophils Absolute: 0.1 10*3/uL (ref 0.0–0.1)
Basophils Relative: 1 %
Eosinophils Absolute: 0.4 10*3/uL (ref 0.0–0.5)
Eosinophils Relative: 3 %
HCT: 40.6 % (ref 36.0–46.0)
Hemoglobin: 13.1 g/dL (ref 12.0–15.0)
Immature Granulocytes: 0 %
Lymphocytes Relative: 28 %
Lymphs Abs: 3.6 10*3/uL (ref 0.7–4.0)
MCH: 31.4 pg (ref 26.0–34.0)
MCHC: 32.3 g/dL (ref 30.0–36.0)
MCV: 97.4 fL (ref 80.0–100.0)
Monocytes Absolute: 1 10*3/uL (ref 0.1–1.0)
Monocytes Relative: 8 %
Neutro Abs: 7.5 10*3/uL (ref 1.7–7.7)
Neutrophils Relative %: 60 %
Platelets: 298 10*3/uL (ref 150–400)
RBC: 4.17 MIL/uL (ref 3.87–5.11)
RDW: 12.6 % (ref 11.5–15.5)
WBC: 12.6 10*3/uL — ABNORMAL HIGH (ref 4.0–10.5)
nRBC: 0 % (ref 0.0–0.2)

## 2020-09-02 LAB — BASIC METABOLIC PANEL
Anion gap: 8 (ref 5–15)
BUN: 23 mg/dL (ref 8–23)
CO2: 30 mmol/L (ref 22–32)
Calcium: 9.9 mg/dL (ref 8.9–10.3)
Chloride: 102 mmol/L (ref 98–111)
Creatinine, Ser: 1.37 mg/dL — ABNORMAL HIGH (ref 0.44–1.00)
GFR, Estimated: 37 mL/min — ABNORMAL LOW (ref >60)
Glucose, Bld: 175 mg/dL — ABNORMAL HIGH (ref 70–99)
Potassium: 4 mmol/L (ref 3.5–5.1)
Sodium: 140 mmol/L (ref 135–145)

## 2020-09-02 LAB — SARS CORONAVIRUS 2 (TAT 6-24 HRS): SARS Coronavirus 2: NEGATIVE

## 2020-09-02 MED ORDER — BENZONATATE 100 MG PO CAPS: 100.0000 mg | ORAL_CAPSULE | Freq: Three times a day (TID) | ORAL | 0 refills | Status: DC

## 2020-09-02 NOTE — Telephone Encounter (Signed)
Patient states that she has a really bad sore throat. Hasn't been tested for Covid. No appointments available. Was advised by the front to go to Urgent Care but was insistent that we could at least send her in some medication.

## 2020-09-02 NOTE — ED Provider Notes (Signed)
[facility]    CSN: [id number] Arrival date & time: 09/02/20  1044      History   Chief Complaint Chief Complaint  Patient presents with  . Cough  . Sore Throat    HPI [name] is a 87 y.o. female.   HPI   Cough: Pt has had a cough and sore throat for the past week. Symptoms started after she got back from the beach. No know sick contacts. She denies SOB, chest pain, productive cough, hemoptysis, fevers. She has tried [employer] which has helped some. In terms of her BP she states that her normal BP is around 140/? She reports that she is aggravated today for having to wait for 2 hours and that she consumed a high amount of salt yesterday.   Past Medical History:  Diagnosis Date  . Arthritis   . Chronic back pain   . Cystoid macular edema of right eye 08/19/2019  . Diabetic neuropathy ([place])   . Hypercholesteremia   . Hypertension   . Liver lesion   . Neuropathy   . Type II or unspecified type diabetes mellitus without mention of complication, not stated as uncontrolled   . Vitamin D deficiency   . Wears glasses     Patient Active Problem List   Diagnosis Date Noted  . Frequent UTI 02/10/2020  . Advanced nonexudative age-related macular degeneration of left eye with subfoveal involvement 01/08/2020  . Memory changes 12/31/2019  . Poorly controlled type 2 diabetes mellitus ([place]) 12/31/2019  . Poor compliance with medication 12/31/2019  . Advanced nonexudative age-related macular degeneration of right eye with subfoveal involvement 11/18/2019  . Exudative age-related macular degeneration of left eye with inactive choroidal neovascularization ([place]) 11/18/2019  . Exudative age-related macular degeneration of right eye with inactive choroidal neovascularization ([place]) 08/19/2019  . Exudative age-related macular degeneration of left eye ([place]) 08/19/2019  . Long term (current) use of insulin ([place]) 06/10/2019  . History of COVID-19 04/16/2019  . Depression,  major, recurrent, in partial remission ([place]) 12/20/2018  . Type 2 diabetes mellitus with complication, with long-term current use of insulin ([place]) 02/09/2017  . Atherosclerosis of aorta ([place]) by AbdCTscan on 06/28/2017 11/06/2016  . Encounter for Medicare annual wellness exam 05/18/2015  . Medication management 08/06/2013  . Neuropathy due to type 2 diabetes mellitus ([place]) 06/04/2013  . CKD stage 4 due to type 2 diabetes mellitus ([place])   . Hyperlipidemia associated with type 2 diabetes mellitus ([place])   . Vitamin D deficiency   . Hypertension 07/29/2012  . Chronic pain 07/29/2012    Past Surgical History:  Procedure Laterality Date  . ABDOMINAL HYSTERECTOMY    . APPENDECTOMY    . BACK SURGERY  1993   lumb lam-fusion  . BREAST EXCISIONAL BIOPSY Right 1967   benign  . CESAREAN SECTION     x 3   . COLONOSCOPY    . EYE SURGERY     both cataracts  . TRIGGER FINGER RELEASE  01/10/2012   Procedure: RELEASE TRIGGER FINGER/A-1 PULLEY;  Surgeon: Hessie Dibble, MD;  Location: [facility];  Service: Orthopedics;  Laterality: Right;  right ring trigger release    OB History   No obstetric history on file.      Home Medications    Prior to Admission medications   Medication Sig Start Date End Date Taking? Authorizing Provider  acetaminophen (TYLENOL) 500 MG tablet Take 500-1,000 mg by mouth 3 (three) times daily as needed.  [provider]  aspirin 81 MG tablet Take 81 mg by mouth daily.    [provider]  ASSURE COMFORT LANCETS 30G MISC Check blood sugar 3 times daily-DX-E11.22 03/11/15   [name], MD  bisoprolol-hydrochlorothiazide [facility]) 5-6.25 MG tablet Take 1 tablet by mouth daily.    [provider]  Blood Glucose Calibration (ACCU-CHEK AVIVA) SOLN Check blood sugar 3 times daily-DX-E11.22 03/11/15   [name], MD  Blood Glucose Monitoring Suppl ([place]) w/Device KIT Check blood sugar 3 times  daily-E11.8 08/18/20   [name], MD  buPROPion (WELLBUTRIN XL) 150 MG 24 hr tablet Take 1 tablet every Morning for Mood , Focus & Concentration 03/28/19   [name], MD  Cholecalciferol (VITAMIN D3) 2000 UNITS TABS Take 1 capsule by mouth 2 (two) times daily.    [provider]  COMFORT EZ PEN NEEDLES 32G X 4 MM MISC USE 2 TIMES DAILY AS DIRECTED , 1 PEN NEEDLE PER INJECTION. 01/14/19   [name], MD  Continuous Blood Gluc Sensor (FREESTYLE LIBRE SENSOR SYSTEM) MISC Check blood sugar 3 times a day. Dx:E11.8, Z79.4 12/26/19   Liane Comber, NP  Continuous Blood Gluc Sensor MISC Check blood sugar 3 times a day- Dx: E11.8/Z79.4 05/09/18   Liane Comber, NP  dorzolamide-timolol (COSOPT) 22.3-6.8 MG/ML ophthalmic solution  03/31/16   [provider]  doxycycline (VIBRAMYCIN) 100 MG capsule TAKE 1 CAPSULE BY MOUTH TWICE DAILY WITH FOOD 06/29/20   Liane Comber, NP  EASY COMFORT INSULIN SYRINGE 30G X 1/2" 0.5 ML MISC  10/01/15   [provider]  gabapentin (NEURONTIN) 300 MG capsule Take 1 capsule 3 to 4 x / day for Painful Diabetic Neuropathy 09/10/19   [name], MD  hyoscyamine (LEVSIN SL) 0.125 MG SL tablet Take 1 to 2 tablets 3 to 4 x day if needed for Nausea, vomiting, cramping or diarrhea 10/01/18   Liane Comber, NP  insulin isophane & regular human (NOVOLIN 70/30 FLEXPEN RELION) (70-30) 100 UNIT/ML KwikPen Inject 20-24 units subcutaneously with breakfast and 18-20 units dinner as directed for diabetes/high blood sugar. Take additional 10-15 units with lunch only if high carb meal. 06/17/20   Liane Comber, NP  lactulose (CHRONULAC) 10 GM/15ML solution Take 15 ml (3 tsp) once or twice daily as needed for constipation. 09/26/18   Liane Comber, NP  latanoprost (XALATAN) 0.005 % ophthalmic solution  03/28/16   [provider]  losartan (COZAAR) 25 MG tablet Take 1 tab daily for blood pressure and kidney protection. 01/06/20   Liane Comber, NP   LOTEMAX 0.5 % GEL  04/14/16   [provider]  Magnesium 400 MG TABS Take 1 tablet by mouth daily.    [provider]  Multiple Vitamins-Minerals (HAIR/SKIN/NAILS) TABS Take by mouth 2 (two) times daily.    [provider]  [facility] VERIO test strip USE 1 STRIP TO CHECK GLUCOSE THREE TIMES DAILY AS DIRECTED 10/17/19   Liane Comber, NP  rOPINIRole (REQUIP) 3 MG tablet Take  1 to 2 tablets  at Bedtime  for "Restless Legs Syndrome" 07/24/20   [name], MD  rosuvastatin (CRESTOR) 20 MG tablet TAKE 1 TABLET BY MOUTH ONCE DAILY FOR CHOLESTEROL 03/12/20   Liane Comber, NP  senna-docusate (SENOKOT-S) 8.6-50 MG tablet Take 1 tablet by mouth daily.    [provider]  vitamin B-12 (CYANOCOBALAMIN) 1000 MCG tablet Take 1,000 mcg by mouth daily.    [provider]  vitamin C (ASCORBIC ACID) 500 MG  tablet Take 500 mg by mouth daily.    [provider]  zinc gluconate 50 MG tablet Take 50 mg by mouth daily.    [provider]    Family History Family History  Problem Relation Age of Onset  . Liver disease Sister   . Diabetes Sister   . Colon cancer Neg Hx     Social History Social History   Tobacco Use  . Smoking status: Never Smoker  . Smokeless tobacco: Never Used  Substance Use Topics  . Alcohol use: No  . Drug use: No     Allergies   Morphine and related   Review of Systems Review of Systems  As stated above in HPI Physical Exam Triage Vital Signs ED Triage Vitals  Enc Vitals Group     BP 09/02/20 1227 (!) 199/77     Pulse Rate 09/02/20 1227 97     Resp 09/02/20 1227 18     Temp 09/02/20 1227 98.3 F (36.8 C)     Temp Source 09/02/20 1227 Oral     SpO2 09/02/20 1227 98 %     Weight --      Height --      Head Circumference --      Peak Flow --      Pain Score 09/02/20 1226 0     Pain Loc --      Pain Edu? --      Excl. in [place]? --    No data found.  Updated Vital Signs BP (!) 199/77 (BP  Location: Right Arm)   Pulse 97   Temp 98.3 F (36.8 C) (Oral)   Resp 18   SpO2 98%   Physical Exam Vitals and nursing note reviewed.  Constitutional:      General: She is not in acute distress.    Appearance: She is well-developed. She is not ill-appearing, toxic-appearing or diaphoretic.  HENT:     Head: Normocephalic and atraumatic.     Right Ear: Tympanic membrane and ear canal normal. No middle ear effusion. Tympanic membrane is not erythematous.     Left Ear: Tympanic membrane and ear canal normal.  No middle ear effusion. Tympanic membrane is not erythematous.     Nose: No congestion or rhinorrhea.     Mouth/Throat:     Mouth: Mucous membranes are moist. No oral lesions.     Pharynx: Oropharynx is clear. Uvula midline. Posterior oropharyngeal erythema (mild) present. No pharyngeal swelling, oropharyngeal exudate or uvula swelling.     Tonsils: No tonsillar exudate or tonsillar abscesses.  Eyes:     Conjunctiva/sclera: Conjunctivae normal.     Pupils: Pupils are equal, round, and reactive to light.  Cardiovascular:     Rate and Rhythm: Normal rate and regular rhythm.     Heart sounds: Normal heart sounds.  Pulmonary:     Effort: Pulmonary effort is normal.     Breath sounds: Normal breath sounds.  Musculoskeletal:     Cervical back: Normal range of motion and neck supple.  Lymphadenopathy:     Cervical: Cervical adenopathy present.  Neurological:     Mental Status: She is alert and oriented to person, place, and time.      UC Treatments / Results  Labs (all labs ordered are listed, but only abnormal results are displayed) Labs Reviewed - No data to display  EKG   Radiology No results found.  Procedures Procedures (including critical care time)  Medications Ordered in UC Medications -  No data to display  Initial Impression / Assessment and Plan / UC Course  I have reviewed the triage vital signs and the nursing notes.  Pertinent labs & imaging results  that were available during my care of the patient were reviewed by me and considered in my medical decision making (see chart for details).     New. Appears viral in nature which I discussed with patient. She will stop her cold medication as it likely is causing her BP to be elevated along with her high salt diet yesterday. [place], hydration with water and close BP monitoring instead. Repeat BP is higher so patient has been directed to ED for further work up. She is agreeable but elects private vehicle transfer.    Final Clinical Impressions(s) / UC Diagnoses   Final diagnoses:  None   Discharge Instructions   None    ED Prescriptions    None     PDMP not reviewed this encounter.   Hughie Closs, Hershal Coria 09/02/20 2106

## 2020-09-02 NOTE — ED Triage Notes (Signed)
Pt reports UC sent her here due to hypertension.Pt reports cough w/ scratchy throat.

## 2020-09-02 NOTE — ED Provider Notes (Signed)
Emergency Medicine Provider Triage Evaluation Note  [name] 87 y.o. female was evaluated in triage.  Pt complains of cough, scratchy throat.  She has been ongoing for couple days.  She went to urgent care today for evaluation of symptoms.  They did a COVID test.  There they noticed that she was slightly bradycardic and hypertensive, prompting ED visit.  She does tell me that she has been taking over-the-counter cough medication for the last several days.  She has been compliant with her medications.  Denies any difficulty breathing, abdominal pain, chest pain, numbness/weakness of arms or legs.   Review of Systems  Positive: Cough, throat irritation Negative: Chest pain, difficulty breathing, numbness/weakness of arms or legs.  Physical Exam  BP 134/82   Pulse 70   Temp 98.2 F (36.8 C) (Oral)   Resp 18   Ht 5\' 4"  (1.626 m)   Wt 65.8 kg   SpO2 100%   BMI 24.89 kg/m  Gen:   Awake, no distress   HEENT:  Atraumatic  Resp:  Normal effort  Cardiac:  Normal rate  Abd:   Nondistended, nontender  MSK:   Moves extremities without difficulty  Neuro:  Speech clear   Other:  N/a   Medical Decision Making  Medically screening exam initiated at 1:54 PM  Appropriate orders placed.  NAREH MATZKE was informed that the remainder of the evaluation will be completed by another provider, this initial triage assessment does not replace that evaluation, and the importance of remaining in the ED until their evaluation is complete.  Clinical Impression  COUGH. htn   Portions of this note were generated with Dragon dictation software. Dictation errors may occur despite best attempts at proofreading.     Volanda Napoleon, PA-C 09/02/20 1355    Arnaldo Natal, MD 09/02/20 [phone]

## 2020-09-02 NOTE — ED Notes (Signed)
Patient is being discharged from the Urgent Care and sent to the Emergency Department via POV with spouse . Per Marquis Lunch, PA, patient is in need of higher level of care due to Hypertension. Patient is aware and verbalizes understanding of plan of care.  Vitals:   09/02/20 1227 09/02/20 1319  BP: (!) 199/77 (!) 206/75  Pulse: 97   Resp: 18   Temp: 98.3 F (36.8 C)   SpO2: 98%

## 2020-09-02 NOTE — Telephone Encounter (Signed)
Left message on the voicemail to call back.

## 2020-09-02 NOTE — ED Triage Notes (Signed)
Pt presents with cough and scratchy throat xs 1 week.

## 2020-09-02 NOTE — ED Notes (Signed)
Pt refusing EKG. Patient wants to leave. Upset about being sent from urgent care. Patient encouraged to stay and see a doctor. Patient expresses the desire to still leave. Patient remains seating at this time.

## 2020-09-03 ENCOUNTER — Ambulatory Visit (INDEPENDENT_AMBULATORY_CARE_PROVIDER_SITE_OTHER): Payer: Medicare Other | Admitting: Ophthalmology

## 2020-09-03 ENCOUNTER — Ambulatory Visit (INDEPENDENT_AMBULATORY_CARE_PROVIDER_SITE_OTHER): Payer: Medicare Other | Admitting: Adult Health

## 2020-09-03 ENCOUNTER — Encounter: Payer: Self-pay | Admitting: Adult Health

## 2020-09-03 ENCOUNTER — Encounter (INDEPENDENT_AMBULATORY_CARE_PROVIDER_SITE_OTHER): Payer: Self-pay | Admitting: Ophthalmology

## 2020-09-03 ENCOUNTER — Encounter (INDEPENDENT_AMBULATORY_CARE_PROVIDER_SITE_OTHER): Payer: Medicare Other | Admitting: Ophthalmology

## 2020-09-03 VITALS — BP 160/70 | HR 56 | Temp 97.3°F | Wt 131.0 lb

## 2020-09-03 DIAGNOSIS — H353114 Nonexudative age-related macular degeneration, right eye, advanced atrophic with subfoveal involvement: Secondary | ICD-10-CM | POA: Diagnosis not present

## 2020-09-03 DIAGNOSIS — H353211 Exudative age-related macular degeneration, right eye, with active choroidal neovascularization: Secondary | ICD-10-CM | POA: Diagnosis not present

## 2020-09-03 DIAGNOSIS — J040 Acute laryngitis: Secondary | ICD-10-CM | POA: Diagnosis not present

## 2020-09-03 DIAGNOSIS — Z794 Long term (current) use of insulin: Secondary | ICD-10-CM

## 2020-09-03 DIAGNOSIS — E118 Type 2 diabetes mellitus with unspecified complications: Secondary | ICD-10-CM

## 2020-09-03 DIAGNOSIS — I10 Essential (primary) hypertension: Secondary | ICD-10-CM | POA: Diagnosis not present

## 2020-09-03 DIAGNOSIS — J069 Acute upper respiratory infection, unspecified: Secondary | ICD-10-CM

## 2020-09-03 DIAGNOSIS — H353222 Exudative age-related macular degeneration, left eye, with inactive choroidal neovascularization: Secondary | ICD-10-CM | POA: Diagnosis not present

## 2020-09-03 MED ORDER — BISOPROLOL-HYDROCHLOROTHIAZIDE 2.5-6.25 MG PO TABS: 1.0000 | ORAL_TABLET | Freq: Every day | ORAL | 3 refills | Status: DC

## 2020-09-03 MED ORDER — LOSARTAN POTASSIUM 25 MG PO TABS
ORAL_TABLET | ORAL | 1 refills | Status: DC
Start: 2020-09-03 — End: 2020-12-03

## 2020-09-03 MED ORDER — BEVACIZUMAB 2.5 MG/0.1ML IZ SOSY
2.5000 mg | PREFILLED_SYRINGE | INTRAVITREAL | Status: AC | PRN
Start: 2020-09-03 — End: 2020-09-03
  Administered 2020-09-03: 2.5 mg via INTRAVITREAL

## 2020-09-03 NOTE — Progress Notes (Signed)
09/03/2020     CHIEF COMPLAINT Patient presents for Retina Follow Up (WIP- va changes OU/Pt states, "Over the last month my va seems to be getting worse by the day. My DVA is very blurry and I have a very large missing spot in the center of my vision. I cannot work my word puzzles this morning." /A1C: unknown/LBS: 157/Pt reports using Cosopt BID OU and Latanoprost QHS)   HISTORY OF PRESENT ILLNESS: [name] is a 87 y.o. female who presents to the clinic today for:   HPI    Retina Follow Up    Diagnosis: Wet AMD   Laterality: both eyes   Onset: 1 month ago   Severity: moderate   Duration: 1 month   Course: gradually worsening   Comments: WIP- va changes OU Pt states, "Over the last month my va seems to be getting worse by the day. My DVA is very blurry and I have a very large missing spot in the center of my vision. I cannot work my word puzzles this morning."  A1C: unknown LBS: 157 Pt reports using Cosopt BID OU and Latanoprost QHS       Last edited by Kendra Opitz, COA on 09/03/2020  2:37 PM. (History)      Referring physician: [name], MD [address] [place],  [place] [address]  HISTORICAL INFORMATION:   Selected notes from the MEDICAL RECORD NUMBER    Lab Results  Component Value Date   HGBA1C 10.4 (H) 05/21/2020     CURRENT MEDICATIONS: Current Outpatient Medications (Ophthalmic Drugs)  Medication Sig  . dorzolamide-timolol (COSOPT) 22.3-6.8 MG/ML ophthalmic solution   . latanoprost (XALATAN) 0.005 % ophthalmic solution   . LOTEMAX 0.5 % GEL    No current facility-administered medications for this visit. (Ophthalmic Drugs)   Current Outpatient Medications (Other)  Medication Sig  . acetaminophen (TYLENOL) 500 MG tablet Take 500-1,000 mg by mouth 3 (three) times daily as needed.  Marland Kitchen aspirin 81 MG tablet Take 81 mg by mouth daily.  . ASSURE COMFORT LANCETS 30G MISC Check blood sugar 3 times daily-DX-E11.22  . benzonatate  (TESSALON) 100 MG capsule Take 1 capsule (100 mg total) by mouth every 8 (eight) hours.  . bisoprolol-hydrochlorothiazide (ZIAC) 2.5-6.25 MG tablet Take 1 tablet by mouth daily.  . Blood Glucose Calibration (ACCU-CHEK AVIVA) SOLN Check blood sugar 3 times daily-DX-E11.22  . Blood Glucose Monitoring Suppl ([place]) w/Device KIT Check blood sugar 3 times daily-E11.8  . buPROPion (WELLBUTRIN XL) 150 MG 24 hr tablet Take 1 tablet every Morning for Mood , Focus & Concentration  . Cholecalciferol (VITAMIN D3) 2000 UNITS TABS Take 1 capsule by mouth 2 (two) times daily.  . COMFORT EZ PEN NEEDLES 32G X 4 MM MISC USE 2 TIMES DAILY AS DIRECTED , 1 PEN NEEDLE PER INJECTION.  Marland Kitchen Continuous Blood Gluc Sensor (FREESTYLE LIBRE SENSOR SYSTEM) MISC Check blood sugar 3 times a day. Dx:E11.8, Z79.4  . Continuous Blood Gluc Sensor MISC Check blood sugar 3 times a day- Dx: E11.8/Z79.4  . EASY COMFORT INSULIN SYRINGE 30G X 1/2" 0.5 ML MISC   . gabapentin (NEURONTIN) 300 MG capsule Take 1 capsule 3 to 4 x / day for Painful Diabetic Neuropathy  . hyoscyamine (LEVSIN SL) 0.125 MG SL tablet Take 1 to 2 tablets 3 to 4 x day if needed for Nausea, vomiting, cramping or diarrhea  . insulin isophane & regular human (NOVOLIN 70/30 FLEXPEN RELION) (70-30) 100 UNIT/ML  KwikPen Inject 20-24 units subcutaneously with breakfast and 18-20 units dinner as directed for diabetes/high blood sugar. Take additional 10-15 units with lunch only if high carb meal.  . lactulose (CHRONULAC) 10 GM/15ML solution Take 15 ml (3 tsp) once or twice daily as needed for constipation.  Marland Kitchen losartan (COZAAR) 25 MG tablet Take 1 tab daily for blood pressure if blood pressure checked in AM is 140/80+  . Magnesium 400 MG TABS Take 1 tablet by mouth daily.  . Multiple Vitamins-Minerals (HAIR/SKIN/NAILS) TABS Take by mouth 2 (two) times daily.  Glory Rosebush VERIO test strip USE 1 STRIP TO CHECK GLUCOSE THREE TIMES DAILY AS DIRECTED  . rOPINIRole  (REQUIP) 3 MG tablet Take  1 to 2 tablets  at Bedtime  for "Restless Legs Syndrome"  . rosuvastatin (CRESTOR) 20 MG tablet TAKE 1 TABLET BY MOUTH ONCE DAILY FOR CHOLESTEROL  . senna-docusate (SENOKOT-S) 8.6-50 MG tablet Take 1 tablet by mouth daily.  . vitamin B-12 (CYANOCOBALAMIN) 1000 MCG tablet Take 1,000 mcg by mouth daily.  . vitamin C (ASCORBIC ACID) 500 MG tablet Take 500 mg by mouth daily.  Marland Kitchen zinc gluconate 50 MG tablet Take 50 mg by mouth daily.   No current facility-administered medications for this visit. (Other)      REVIEW OF SYSTEMS:    ALLERGIES Allergies  Allergen Reactions  . Morphine And Related Other (See Comments)    fuzzy feeling    PAST MEDICAL HISTORY Past Medical History:  Diagnosis Date  . Arthritis   . Chronic back pain   . Cystoid macular edema of right eye 08/19/2019  . Diabetic neuropathy ([place])   . Hypercholesteremia   . Hypertension   . Liver lesion   . Neuropathy   . Type II or unspecified type diabetes mellitus without mention of complication, not stated as uncontrolled   . Vitamin D deficiency   . Wears glasses    Past Surgical History:  Procedure Laterality Date  . ABDOMINAL HYSTERECTOMY    . APPENDECTOMY    . BACK SURGERY  1993   lumb lam-fusion  . BREAST EXCISIONAL BIOPSY Right 1967   benign  . CESAREAN SECTION     x 3   . COLONOSCOPY    . EYE SURGERY     both cataracts  . TRIGGER FINGER RELEASE  01/10/2012   Procedure: RELEASE TRIGGER FINGER/A-1 PULLEY;  Surgeon: Hessie Dibble, MD;  Location: [facility];  Service: Orthopedics;  Laterality: Right;  right ring trigger release    FAMILY HISTORY Family History  Problem Relation Age of Onset  . Liver disease Sister   . Diabetes Sister   . Colon cancer Neg Hx     SOCIAL HISTORY Social History   Tobacco Use  . Smoking status: Never Smoker  . Smokeless tobacco: Never Used  Substance Use Topics  . Alcohol use: No  . Drug use: No         OPHTHALMIC  EXAM:  Base Eye Exam    Visual Acuity (ETDRS)      Right Left   Dist [place] 20/200 20/70 -2   Dist ph [place] NI NI   Correction: Glasses       Tonometry (Tonopen, 2:41 PM)      Right Left   Pressure 16 14       Pupils      Pupils Dark Light Shape React APD   Right PERRL 2 1 Round Brisk None   Left PERRL 2 1 Round  Brisk None       Visual Fields (Counting fingers)      Left Right    Full    Restrictions  Total inferior nasal deficiency       Extraocular Movement      Right Left    Full Full       Neuro/Psych    Oriented x3: Yes   Mood/Affect: Normal       Dilation    Both eyes: 1.0% Mydriacyl, 2.5% Phenylephrine @ 2:41 PM        Slit Lamp and Fundus Exam    External Exam      Right Left   External Normal Normal       Slit Lamp Exam      Right Left   Lids/Lashes Normal Normal   Conjunctiva/Sclera White and quiet White and quiet   Cornea Clear Clear   Anterior Chamber Deep and quiet Deep and quiet   Iris Round and reactive Round and reactive   Lens Posterior chamber intraocular lens Posterior chamber intraocular lens   Anterior Vitreous Normal Normal       Fundus Exam      Right Left   Posterior Vitreous Posterior vitreous detachment Posterior vitreous detachment   Disc Normal Normal   C/D Ratio 0.4 0.45   Macula Geographic atrophy 3 DA size, Advanced age related macular degeneration, no macular thickening, Retinal pigment epithelial mottling, no exudates, Subretinal hemorrhage Geographic atrophy 2 DA size, Advanced age related macular degeneration, no macular thickening, Retinal pigment epithelial mottling, reddish hue in the parafoveal region, not in the FAZ   Vessels Normal, no DR Normal, no DR   Periphery Normal Normal          IMAGING AND PROCEDURES  Imaging and Procedures for 09/03/20  OCT, Retina - OU - Both Eyes       Right Eye Quality was good. Scan locations included subfoveal. Central Foveal Thickness: 277. Progression has been stable.  Findings include no IRF, abnormal foveal contour, no SRF, disciform scar, central retinal atrophy, outer retinal atrophy, inner retinal atrophy.   Left Eye Quality was good. Scan locations included subfoveal. Central Foveal Thickness: 231. Findings include abnormal foveal contour, no SRF, no IRF, disciform scar, central retinal atrophy, inner retinal atrophy, outer retinal atrophy.   Notes No signs of active CNVM OS,  With small area of subretinal hemorrhage superior to the FAZ OD.  Intraretinal hemorrhage temporally as well           Intravitreal Injection, Pharmacologic Agent - OD - Right Eye       Time Out 09/03/2020. 3:18 PM. Confirmed correct patient, procedure, site, and patient consented.   Anesthesia Topical anesthesia was used. Anesthetic medications included [place] 3.5%.   Procedure Preparation included Tobramycin 0.3%, Ofloxacin , 10% betadine to eyelids, 5% betadine to ocular surface. A 30 gauge needle was used.   Injection:  2.5 mg Bevacizumab (AVASTIN) 2.41m/0.1mL SOSY   NDC:: 50037-048-88 Lot:: [id number]  Route: Intravitreal, Site: Right Eye  Post-op Post injection exam found visual acuity of at least counting fingers. The patient tolerated the procedure well. There were no complications. The patient received written and verbal post procedure care education. Post injection medications were not given.                 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 degeneration of right eye with subfoveal involvement Accounts for acuity yet with an edge of CNVM active OD  Exudative age-related  macular degeneration of right eye with active choroidal neovascularization ([place]) OD with recurrence of wet AMD with SR heme noted superior to the fovea will repeat today injection to prevent scotoma enlargement  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) No signs of CNVM OS  Type 2 diabetes  mellitus with complication, with long-term current use of insulin (HCC) No detectable diabetic retinopathy      ICD-10-CM   1. 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)  H35.3222 OCT, Retina - OU - Both Eyes  3. Advanced nonexudative age-related macular degeneration of right eye with subfoveal involvement  H35.3114   4. Type 2 diabetes mellitus with complication, with long-term current use of insulin (HCC)  E11.8    Z79.4     1.  Wet ARMD OD, with central foveal atrophy OD yet risk of enlargement of scotoma with CNVM.  We will treat again today with Avastin intravitreal examination next again in 6 weeks  2.  Dry atrophic ARMD OS, no progression  3.  Ophthalmic Meds Ordered this visit:  Meds ordered this encounter  Medications  . bevacizumab (AVASTIN) SOSY 2.5 mg       Return in about 7 weeks (around 10/22/2020) for dilate, OD, AVASTIN OCT.  There are no Patient Instructions on file for this visit.   Explained the diagnoses, plan, and follow up with the patient and they expressed understanding.  Patient expressed understanding of the importance of proper follow up care.   Clent Demark Delbert Vu M.D. Diseases & Surgery of the Retina and Vitreous [facility] [place] 09/03/20     Abbreviations: M myopia (nearsighted); A astigmatism; H hyperopia (farsighted); P presbyopia; Mrx spectacle prescription;  CTL contact lenses; OD right eye; OS left eye; OU both eyes  XT exotropia; ET esotropia; PEK punctate epithelial keratitis; PEE punctate epithelial erosions; DES dry eye syndrome; MGD meibomian gland dysfunction; ATs artificial tears; PFAT's preservative free artificial tears; [place] nuclear sclerotic cataract;  PSC posterior subcapsular cataract; ERM epi-retinal membrane; PVD posterior vitreous detachment; RD retinal detachment; DM diabetes mellitus; DR diabetic retinopathy; NPDR non-proliferative diabetic retinopathy; PDR proliferative diabetic retinopathy; CSME clinically significant macular edema; DME diabetic macular edema; dbh dot blot hemorrhages; CWS cotton wool spot; POAG primary open angle glaucoma; C/D cup-to-disc ratio; HVF humphrey visual field; GVF goldmann visual field; OCT optical coherence tomography; IOP intraocular pressure; BRVO Branch retinal vein occlusion; CRVO central retinal vein occlusion; CRAO central retinal artery occlusion; BRAO branch retinal artery occlusion; RT retinal tear; SB scleral buckle; PPV pars plana vitrectomy; VH Vitreous hemorrhage; PRP panretinal laser photocoagulation; IVK intravitreal kenalog; VMT vitreomacular traction; MH Macular hole;  NVD neovascularization of the disc; NVE neovascularization elsewhere; AREDS age related eye disease study; ARMD age related macular degeneration; POAG primary open angle glaucoma; EBMD epithelial/anterior basement membrane dystrophy; ACIOL anterior chamber intraocular lens; IOL intraocular lens; PCIOL posterior chamber intraocular lens; Phaco/IOL phacoemulsification with intraocular lens placement; [place] photorefractive keratectomy; LASIK laser assisted in situ keratomileusis; HTN hypertension; DM diabetes mellitus; COPD chronic obstructive pulmonary disease

## 2020-09-03 NOTE — Progress Notes (Signed)
Assessment and Plan:  [name] was seen today for hypertension and sore throat.  Diagnoses and all orders for this visit:  Viral URI Laryngitis, acute Benign exam today;  Take medications as prescribed, increase fluids,  Salt water gargles, sugar free throat lozenges Discussed the importance of avoiding unnecessary antibiotic therapy. Suggested symptomatic OTC remedies. She is reassured by exam and discussion today If symptoms do not improve in 10-14 days from onset or get worse (fever/severe sore throat, HA) contact office.   Essential hypertension Historically poor health literacy and poor consistency with meds/compliance Will switch ziac script for clarity - stop previous 1/2 tab 5/6.25, sent in 2.5/6.25 mg tabs and she will discard excess Start losartan 25 mg if AM check is 140/80+ Keep log to bring with her, keep appointment in 1 week for recheck Monitor blood pressure at home; call if consistently over 150/90 Reviewed DASH diet, limit salt   Reminder to go to the ER if any CP, SOB, nausea, dizziness, severe HA, changes vision/speech, left arm numbness and tingling and jaw pain. -     bisoprolol-hydrochlorothiazide (ZIAC) 2.5-6.25 MG tablet; Take 1 tablet by mouth daily. -     losartan (COZAAR) 25 MG tablet; Take 1 tab daily for blood pressure if blood pressure checked in AM is 140/80+  Further disposition pending results of labs. Discussed med's effects and SE's.   Over 30 minutes of exam, counseling, chart review, and critical decision making was performed.   Future Appointments  Date Time Provider [place]  09/03/2020  2:45 PM Rankin, Clent Demark, MD [facility] None  09/09/2020  2:00 PM Liane Comber, NP [facility] None  01/25/2021  1:15 PM Rankin, Clent Demark, MD [facility] None    ------------------------------------------------------------------------------------------------------------------   HPI BP (!) 160/62   Pulse (!) 56   Temp (!) 97.3 F (36.3 C)   Wt 131 lb (59.4  kg)   SpO2 94%   BMI 23.21 kg/m   87 y.o.female presents for evaluation of sore throat/hoarseness and elevated BP.   She reports was on vacation at the beach last week, returned 5 days ago, reports on the way home she started noting scratchy sensation in her throat, mild dry cough; reports by day 3 was progressing/worse (worsening hoarseness, cough remains mild with voice rest). She reports cough feels like from irritation in throat. Denies congestion, but does report several weeks prior to onset has had clear rhinitis, has since resolved. Denies hx of allergies, recent itching, sneezing.   She was evaluated at [facility] yesterday, reports had negative rapid covid 19 test there. Felt likely viral etiology, was prescribed benzonatate 100 mg q8h, has only taken 2 tabs thus far, unsure if helping.   Reports had taken "cold-eze" sugar free cough drops.   Her blood pressure has not been controlled at home, "running high" but unsure what, was very elevated at UC/ED yesterday, 180+, today their BP is BP: (!) 160/62, similar by manual recheck by provider.   She is currently taking ziac 5-6.25 mg but taking only 1/2 tab "for many years", has been prescribed losarstan 25 mg but doesn't thing has been taking. Denies HA, CP, dyspnea.    Past Medical History:  Diagnosis Date  . Arthritis   . Chronic back pain   . Cystoid macular edema of right eye 08/19/2019  . Diabetic neuropathy ([place])   . Hypercholesteremia   . Hypertension   . Liver lesion   . Neuropathy   . Type II or unspecified type diabetes mellitus without  mention of complication, not stated as uncontrolled   . Vitamin D deficiency   . Wears glasses      Allergies  Allergen Reactions  . Morphine And Related Other (See Comments)    fuzzy feeling    Current Outpatient Medications on File Prior to Visit  Medication Sig  . acetaminophen (TYLENOL) 500 MG tablet Take 500-1,000 mg by mouth 3 (three) times daily as needed.  Marland Kitchen aspirin 81 MG  tablet Take 81 mg by mouth daily.  . ASSURE COMFORT LANCETS 30G MISC Check blood sugar 3 times daily-DX-E11.22  . benzonatate (TESSALON) 100 MG capsule Take 1 capsule (100 mg total) by mouth every 8 (eight) hours.  . bisoprolol-hydrochlorothiazide (ZIAC) 5-6.25 MG tablet Take 1 tablet by mouth daily.  . Blood Glucose Calibration (ACCU-CHEK AVIVA) SOLN Check blood sugar 3 times daily-DX-E11.22  . Blood Glucose Monitoring Suppl ([place]) w/Device KIT Check blood sugar 3 times daily-E11.8  . buPROPion (WELLBUTRIN XL) 150 MG 24 hr tablet Take 1 tablet every Morning for Mood , Focus & Concentration  . Cholecalciferol (VITAMIN D3) 2000 UNITS TABS Take 1 capsule by mouth 2 (two) times daily.  . COMFORT EZ PEN NEEDLES 32G X 4 MM MISC USE 2 TIMES DAILY AS DIRECTED , 1 PEN NEEDLE PER INJECTION.  Marland Kitchen Continuous Blood Gluc Sensor (FREESTYLE LIBRE SENSOR SYSTEM) MISC Check blood sugar 3 times a day. Dx:E11.8, Z79.4  . Continuous Blood Gluc Sensor MISC Check blood sugar 3 times a day- Dx: E11.8/Z79.4  . dorzolamide-timolol (COSOPT) 22.3-6.8 MG/ML ophthalmic solution   . EASY COMFORT INSULIN SYRINGE 30G X 1/2" 0.5 ML MISC   . gabapentin (NEURONTIN) 300 MG capsule Take 1 capsule 3 to 4 x / day for Painful Diabetic Neuropathy  . hyoscyamine (LEVSIN SL) 0.125 MG SL tablet Take 1 to 2 tablets 3 to 4 x day if needed for Nausea, vomiting, cramping or diarrhea  . insulin isophane & regular human (NOVOLIN 70/30 FLEXPEN RELION) (70-30) 100 UNIT/ML KwikPen Inject 20-24 units subcutaneously with breakfast and 18-20 units dinner as directed for diabetes/high blood sugar. Take additional 10-15 units with lunch only if high carb meal.  . lactulose (CHRONULAC) 10 GM/15ML solution Take 15 ml (3 tsp) once or twice daily as needed for constipation.  Marland Kitchen latanoprost (XALATAN) 0.005 % ophthalmic solution   . losartan (COZAAR) 25 MG tablet Take 1 tab daily for blood pressure and kidney protection.  . LOTEMAX 0.5 % GEL    . Magnesium 400 MG TABS Take 1 tablet by mouth daily.  . Multiple Vitamins-Minerals (HAIR/SKIN/NAILS) TABS Take by mouth 2 (two) times daily.  Glory Rosebush VERIO test strip USE 1 STRIP TO CHECK GLUCOSE THREE TIMES DAILY AS DIRECTED  . rOPINIRole (REQUIP) 3 MG tablet Take  1 to 2 tablets  at Bedtime  for "Restless Legs Syndrome"  . rosuvastatin (CRESTOR) 20 MG tablet TAKE 1 TABLET BY MOUTH ONCE DAILY FOR CHOLESTEROL  . senna-docusate (SENOKOT-S) 8.6-50 MG tablet Take 1 tablet by mouth daily.  . vitamin B-12 (CYANOCOBALAMIN) 1000 MCG tablet Take 1,000 mcg by mouth daily.  . vitamin C (ASCORBIC ACID) 500 MG tablet Take 500 mg by mouth daily.  Marland Kitchen zinc gluconate 50 MG tablet Take 50 mg by mouth daily.  Marland Kitchen doxycycline (VIBRAMYCIN) 100 MG capsule TAKE 1 CAPSULE BY MOUTH TWICE DAILY WITH FOOD (Patient not taking: Reported on 09/03/2020)   No current facility-administered medications on file prior to visit.    ROS: all negative except above.  Physical Exam:  BP (!) 160/62   Pulse (!) 56   Temp (!) 97.3 F (36.3 C)   Wt 131 lb (59.4 kg)   SpO2 94%   BMI 23.21 kg/m   General Appearance: Well nourished, well dressed elder female in no apparent distress. Eyes: PERRLA, EOMs, conjunctiva no swelling or erythema Sinuses: No Frontal/maxillary tenderness ENT/Mouth: Ext aud canals clear, TMs without erythema, bulging. No erythema, swelling, or exudate on post pharynx.  Tonsils not swollen or erythematous. Hearing normal. Hoarse vocal quality Neck: Supple, thyroid normal.  Respiratory: Respiratory effort normal, BS equal bilaterally without rales, rhonchi, wheezing or stridor.  Cardio: RRR with no MRGs. Brisk peripheral pulses without edema.  Lymphatics: Non tender without lymphadenopathy.  Musculoskeletal:  normal gait.  Skin: Warm, dry without rashes, lesions, ecchymosis.  Neuro: Cranial nerves intact. Normal muscle tone Psych: Awake and oriented X 3, normal affect, Insight and Judgment  appropriate.     Izora Ribas, NP 11:43 AM [name]

## 2020-09-03 NOTE — Assessment & Plan Note (Signed)
OD with recurrence of wet AMD with SR heme noted superior to the fovea will repeat today injection to prevent scotoma enlargement

## 2020-09-03 NOTE — Telephone Encounter (Signed)
Patient coming in today for an appointment

## 2020-09-03 NOTE — Patient Instructions (Addendum)
Goal blood pressure is <140/80  Take ziac - new dose - 2.5-6.25 tabs daily   Check blood pressure every morning - if 140/80+ then take 25 mg of losartan, skip if running lower than this       -Make sure you are drinking plenty of fluids to stay hydrated.  -you can do salt water gargles. You can also do 1 TSP liquid Maalox and 1 TSP liquid benadryl- mix/ gargle/ spit - continue with regular sugar free throat drops/with menthol - sugar free cool fluids - voice rest - avoid talking as much as possible  If you are not feeling better in 10-14 days, then please call the office.     Pharyngitis Pharyngitis is redness, pain, and swelling (inflammation) of your pharynx.  CAUSES  Pharyngitis is usually caused by infection. Most of the time, these infections are from viruses (viral) and are part of a cold. However, sometimes pharyngitis is caused by bacteria (bacterial). Pharyngitis can also be caused by allergies. Viral pharyngitis may be spread from person to person by coughing, sneezing, and personal items or utensils (cups, forks, spoons, toothbrushes). Bacterial pharyngitis may be spread from person to person by more intimate contact, such as kissing.  SIGNS AND SYMPTOMS  Symptoms of pharyngitis include:   Sore throat.   Tiredness (fatigue).   Low-grade fever.   Headache.  Joint pain and muscle aches.  Skin rashes.  Swollen lymph nodes.  Plaque-like film on throat or tonsils (often seen with bacterial pharyngitis). DIAGNOSIS  Your health care provider will ask you questions about your illness and your symptoms. Your medical history, along with a physical exam, is often all that is needed to diagnose pharyngitis. Sometimes, a rapid strep test is done. Other lab tests may also be done, depending on the suspected cause.  TREATMENT  Viral pharyngitis will usually get better in 3-4 days without the use of medicine. Bacterial pharyngitis is treated with medicines that  kill germs (antibiotics).  HOME CARE INSTRUCTIONS   Drink enough water and fluids to keep your urine clear or pale yellow.   Only take over-the-counter or prescription medicines as directed by your health care provider:   If you are prescribed antibiotics, make sure you finish them even if you start to feel better.   Do not take aspirin.   Get lots of rest.   Gargle with 8 oz of salt water ( tsp of salt per 1 qt of water) as often as every 1-2 hours to soothe your throat.   Throat lozenges (if you are not at risk for choking) or sprays may be used to soothe your throat. SEEK MEDICAL CARE IF:   You have large, tender lumps in your neck.  You have a rash.  You cough up green, yellow-brown, or bloody spit. SEEK IMMEDIATE MEDICAL CARE IF:   Your neck becomes stiff.  You drool or are unable to swallow liquids.  You vomit or are unable to keep medicines or liquids down.  You have severe pain that does not go away with the use of recommended medicines.  You have trouble breathing (not caused by a stuffy nose). MAKE SURE YOU:   Understand these instructions.  Will watch your condition.  Will get help right away if you are not doing well or get worse. Document Released: 04/11/2005 Document Revised: 01/30/2013 Document Reviewed: 12/17/2012 [facility] Patient Information 2015 [place], [place]. This information is not intended to replace advice given to you by your health care provider. Make sure you  discuss any questions you have with your health care provider.  

## 2020-09-03 NOTE — Assessment & Plan Note (Signed)
No detectable diabetic retinopathy 

## 2020-09-03 NOTE — Assessment & Plan Note (Signed)
Accounts for acuity yet with an edge of CNVM active OD

## 2020-09-03 NOTE — Assessment & Plan Note (Signed)
No signs of CNVM OS

## 2020-09-07 ENCOUNTER — Other Ambulatory Visit: Payer: Self-pay | Admitting: Internal Medicine

## 2020-09-07 NOTE — Progress Notes (Deleted)
Patient ID: [name], female   DOB: 07/27/1929, 87 y.o.   MRN: [id number]  CPE  Assessment:    Encounter for Annual wellness exam Due annually  ****  Atherosclerosis of aorta ([place]) Control blood pressure, cholesterol, glucose, increase exercise.  -     Lipid panel -     Hemoglobin A1c  Essential hypertension - hypotensive - reduce from 1 tab 5/6.25 mg ziac - 1/2 tab until runs out, follow up in 4 weeks to recheck, then plan to do 2.5/6.25 mg dose to  DASH diet, exercise and monitor at home. Call if greater than 130/80. -     CBC with Differential/Platelet -     CMP/GFR -     TSH -     UA/microlbumin -     Magnesium -     EKG  Uncontrolled secondary diabetes mellitus with stage 3 CKD (GFR 30-59) (HCC)/ Long term current use of insulin ([place]) Discussed general issues about diabetes pathophysiology and management., Educational material distributed., Suggested low cholesterol diet., Encouraged aerobic exercise., Discussed foot care., Reminded to get yearly retinal exam. NO GLUCOSE LOG TO REVIEW; PATIENT CANNOT ADEQUATELY REPORT CURRENT INSULIN USE *** Restart glucose/insulin logs with food log; follow up in 4 weeks  -     Hemoglobin A1c  Type 2 diabetes with neuropathy ([place]) Discussed general issues about diabetes pathophysiology and management., Educational material distributed., Suggested low cholesterol diet., Encouraged aerobic exercise., Discussed foot care., Reminded to get yearly retinal exam. -     Hemoglobin A1c -     Diabetes foot exam   Mixed hyperlipidemia associated with T2DM ([place]) -continue medications for LDL goal <70, med compliance is ongoing issue check lipids, decrease fatty foods, increase activity.  -     Lipid panel  Macular degeneration (HCC) Getting injections by Dr. Zadie Rhine  Chronic pain syndrome Monitor, ortho follows  Medication management -     Magnesium  Vitamin D deficiency At goal at recent check; continue to recommend supplementation  for goal of 60-100 Check vitamin D level  Medication management -     CBC with Differential/Platelet -     COMPLETE METABOLIC PANEL WITH GFR -     Magnesium  Major depression in partial remission (HCC)  low dose wellbutrin for energy and motivation *** Lifestyle discussed: diet/exerise, sleep hygiene, stress management, hydration  Unsteady gait Secondary to poor vision, neuropathy, ortho concerns PT ***  Poor compliance with medication ***   No orders of the defined types were placed in this encounter.     Future Appointments  Date Time Provider Ronald  09/09/2020  2:00 PM Liane Comber, NP [facility] None  10/22/2020  2:00 PM Rankin, Clent Demark, MD [facility] None      Plan:   During the course of the visit the patient was educated and counseled about appropriate screening and preventive services including:    Pneumococcal vaccine   Influenza vaccine  Td vaccine  Screening electrocardiogram  Bone densitometry screening  Colorectal cancer screening  Diabetes screening  Glaucoma screening  Nutrition counseling   Advanced directives: requested   Subjective:   [name]  presents for CPE and follow up for HTN, chol, DM with CKD/neuropathy. She has Hypertension; Chronic pain; CKD stage 4 due to type 2 diabetes mellitus ([place]); Hyperlipidemia associated with type 2 diabetes mellitus ([place]); Vitamin D deficiency; Neuropathy due to type 2 diabetes mellitus ([place]); Medication management; Encounter for Medicare annual wellness exam; Atherosclerosis of aorta ([place]) by AbdCTscan  on 06/28/2017; Type 2 diabetes mellitus with complication, with long-term current use of insulin ([place]); Depression, major, recurrent, in partial remission ([place]); History of COVID-19; Long term (current) use of insulin ([place]); Exudative age-related macular degeneration of right eye with active choroidal neovascularization ([place]); Exudative age-related macular degeneration of left eye ([place]);  Advanced nonexudative age-related macular degeneration of right eye with subfoveal involvement; Exudative age-related macular degeneration of left eye with inactive choroidal neovascularization ([place]); Memory changes; Poorly controlled type 2 diabetes mellitus ([place]); Poor compliance with medication; Advanced nonexudative age-related macular degeneration of left eye with subfoveal involvement; and Frequent UTI on their problem list.  Married, just had 65 th wedding anniversary ***   She feels steady on feet in home but some difficulty on uneven ground, has cane and does well when she uses it.  She has a garden, helps gets some of the beans/garden, states she is not able to do as much as she use to, will rest often.   She has reported lack of motivation to do anything, fatigue for a few weeks, prescribed Wellbutrin, taking irregularly, unsure if this helps, though mood is "fine."    BMI is There is no height or weight on file to calculate BMI., she has been working on diet but hasn't been exercising, lack of energy and no safe walking around her house. She could go to church and walk in their gym, plans to go with husband or friend.  Wt Readings from Last 3 Encounters:  09/03/20 131 lb (59.4 kg)  07/16/20 130 lb (59 kg)  06/17/20 131 lb (59.4 kg)   Patient is treated for HTN since 2000, taking ziac 2.5/6.25 mg daily and recently on losartan 25 mg if BP 140/80+   reports fatigued and light headed. ***  She has aortic atherosclerosis per CT 2017. Patient had a negative Cardiolite in 2009.  Patient has had no complaints of any cardiac type chest pain, palpitations, dyspnea/orthopnea/PND, dizziness, claudication, or dependent edema.  Hyperlipidemia is not currently controlled, prescribed rosuvastatin 20 mg daily ***. Patient denies myalgias or other med SE's. Last Lipids were not at goal but suspect was off of meds, previously fairly controlled Lab Results  Component Value Date   CHOL 253 (H)  05/21/2020   HDL 45 (L) 05/21/2020   LDLCALC 157 (H) 05/21/2020   TRIG 330 (H) 05/21/2020   CHOLHDL 5.6 (H) 05/21/2020   Also, the patient has history of T2_IDDM x 1996 and has been on insulin since 2000 and she has complications w/ CKD and Neuropathy Off of metformin due to CKD 3b; She is on losartan, nephrology *** on bASA, statin she is on novolin 70/30, 18 units AM, then has sliding scale for PM, takes 22-25 units ***  however questionable compliance, limited health literacy/memory She reports checking glucose irregularly, commonly 200+, higher in afternoons, admits not keeping a log, this AM was 121.  She has had no symptoms of reactive hypoglycemia, diabetic polys or visual blurring.  She does report tingling and occasional painful paresthesias of the feet. She takes requip for RLS, reports hemp oil helping with foot pain.  Lab Results  Component Value Date   HGBA1C 10.4 (H) 05/21/2020   Lab Results  Component Value Date   GFRNONAA 89 (L) 09/02/2020   Further, the patient also has history of Vitamin D Deficiency, on supplements.  Lab Results  Component Value Date   VD25OH 60 05/21/2020      Lab Results  Component Value Date   VITAMINB12 >  2,000 (H) 12/23/2019     Current Outpatient Medications on File Prior to Visit  Medication Sig  . acetaminophen (TYLENOL) 500 MG tablet Take 500-1,000 mg by mouth 3 (three) times daily as needed.  Marland Kitchen aspirin 81 MG tablet Take 81 mg by mouth daily.  . ASSURE COMFORT LANCETS 30G MISC Check blood sugar 3 times daily-DX-E11.22  . benzonatate (TESSALON) 100 MG capsule Take 1 capsule (100 mg total) by mouth every 8 (eight) hours.  . bisoprolol-hydrochlorothiazide (ZIAC) 2.5-6.25 MG tablet Take 1 tablet by mouth daily.  . Blood Glucose Calibration (ACCU-CHEK AVIVA) SOLN Check blood sugar 3 times daily-DX-E11.22  . Blood Glucose Monitoring Suppl ([place]) w/Device KIT Check blood sugar 3 times daily-E11.8  . buPROPion  (WELLBUTRIN XL) 150 MG 24 hr tablet Take 1 tablet every Morning for Mood , Focus & Concentration  . Cholecalciferol (VITAMIN D3) 2000 UNITS TABS Take 1 capsule by mouth 2 (two) times daily.  . COMFORT EZ PEN NEEDLES 32G X 4 MM MISC USE 2 TIMES DAILY AS DIRECTED , 1 PEN NEEDLE PER INJECTION.  Marland Kitchen Continuous Blood Gluc Sensor (FREESTYLE LIBRE SENSOR SYSTEM) MISC Check blood sugar 3 times a day. Dx:E11.8, Z79.4  . Continuous Blood Gluc Sensor MISC Check blood sugar 3 times a day- Dx: E11.8/Z79.4  . dorzolamide-timolol (COSOPT) 22.3-6.8 MG/ML ophthalmic solution   . EASY COMFORT INSULIN SYRINGE 30G X 1/2" 0.5 ML MISC   . gabapentin (NEURONTIN) 300 MG capsule Take 1 capsule 3 to 4 x / day for Painful Diabetic Neuropathy  . hyoscyamine (LEVSIN SL) 0.125 MG SL tablet Take 1 to 2 tablets 3 to 4 x day if needed for Nausea, vomiting, cramping or diarrhea  . insulin isophane & regular human (NOVOLIN 70/30 FLEXPEN RELION) (70-30) 100 UNIT/ML KwikPen Inject 20-24 units subcutaneously with breakfast and 18-20 units dinner as directed for diabetes/high blood sugar. Take additional 10-15 units with lunch only if high carb meal.  . lactulose (CHRONULAC) 10 GM/15ML solution Take 15 ml (3 tsp) once or twice daily as needed for constipation.  Marland Kitchen latanoprost (XALATAN) 0.005 % ophthalmic solution   . losartan (COZAAR) 25 MG tablet Take 1 tab daily for blood pressure if blood pressure checked in AM is 140/80+  . LOTEMAX 0.5 % GEL   . Magnesium 400 MG TABS Take 1 tablet by mouth daily.  . Multiple Vitamins-Minerals (HAIR/SKIN/NAILS) TABS Take by mouth 2 (two) times daily.  Glory Rosebush VERIO test strip USE 1 STRIP TO CHECK GLUCOSE THREE TIMES DAILY AS DIRECTED  . rOPINIRole (REQUIP) 3 MG tablet Take  1 to 2 tablets  at Bedtime  for "Restless Legs Syndrome"  . rosuvastatin (CRESTOR) 20 MG tablet TAKE 1 TABLET BY MOUTH ONCE DAILY FOR CHOLESTEROL  . senna-docusate (SENOKOT-S) 8.6-50 MG tablet Take 1 tablet by mouth daily.  .  vitamin B-12 (CYANOCOBALAMIN) 1000 MCG tablet Take 1,000 mcg by mouth daily.  . vitamin C (ASCORBIC ACID) 500 MG tablet Take 500 mg by mouth daily.  Marland Kitchen zinc gluconate 50 MG tablet Take 50 mg by mouth daily.   No current facility-administered medications on file prior to visit.   Patient Active Problem List   Diagnosis Date Noted  . Frequent UTI 02/10/2020  . Advanced nonexudative age-related macular degeneration of left eye with subfoveal involvement 01/08/2020  . Memory changes 12/31/2019  . Poorly controlled type 2 diabetes mellitus ([place]) 12/31/2019  . Poor compliance with medication 12/31/2019  . Advanced nonexudative age-related macular degeneration of right eye  with subfoveal involvement 11/18/2019  . Exudative age-related macular degeneration of left eye with inactive choroidal neovascularization ([place]) 11/18/2019  . Exudative age-related macular degeneration of right eye with active choroidal neovascularization ([place]) 08/19/2019  . Exudative age-related macular degeneration of left eye ([place]) 08/19/2019  . Long term (current) use of insulin ([place]) 06/10/2019  . History of COVID-19 04/16/2019  . Depression, major, recurrent, in partial remission ([place]) 12/20/2018  . Type 2 diabetes mellitus with complication, with long-term current use of insulin ([place]) 02/09/2017  . Atherosclerosis of aorta ([place]) by AbdCTscan on 06/28/2017 11/06/2016  . Encounter for Medicare annual wellness exam 05/18/2015  . Medication management 08/06/2013  . Neuropathy due to type 2 diabetes mellitus ([place]) 06/04/2013  . CKD stage 4 due to type 2 diabetes mellitus ([place])   . Hyperlipidemia associated with type 2 diabetes mellitus ([place])   . Vitamin D deficiency   . Hypertension 07/29/2012  . Chronic pain 07/29/2012    Screening Tests  Immunization History  Administered Date(s) Administered  . DT (Pediatric) 01/04/2011  . Influenza Split 01/23/2013  . Influenza, High Dose Seasonal PF 02/27/2014, 03/11/2015,  12/21/2015, 01/30/2017, 02/08/2018, 01/02/2019  . Influenza-Unspecified 02/03/2020  . PFIZER(Purple Top)SARS-COV-2 Vaccination 07/22/2019, 08/13/2019  . Pneumococcal Conjugate-13 02/27/2014  . Pneumococcal Polysaccharide-23 01/06/2012    *** Preventative care: Last colonoscopy: 2006 will not get another due to age [facility] 12/2019 DEXA 2007 declines  Tetanus 2012 Flu 2020 Pneumo 2013 Prevnar 13 2015 Shingles vaccine declines Covid 19: 2/2, 2021, pfizer  Names of Other Physician/Practitioners you currently use: 1. [facility] here for primary care 2. Dr[facility] Rankin, eye doctor, last visit in 09/04/2019, no retinopathy, report abstracted,  3. Dr Sissy Hoff, DDS, dentist, last visit has scheduled 01/2020  Patient Care Team: [name], MD as PCP - General (Internal Medicine) Warden Fillers, MD as Consulting Physician (Optometry) Zadie Rhine Clent Demark, MD as Consulting Physician (Ophthalmology) Glenna Fellows, MD as Attending Physician (Neurosurgery) Melrose Nakayama, MD as Consulting Physician (Orthopedic Surgery) Milus Banister, MD as Attending Physician (Gastroenterology)   Allergies Allergies  Allergen Reactions  . Morphine And Related Other (See Comments)    fuzzy feeling    SURGICAL HISTORY She  has a past surgical history that includes Abdominal hysterectomy; Cesarean section; Back surgery (1993); Appendectomy; Colonoscopy; Eye surgery; Trigger finger release (01/10/2012); and Breast excisional biopsy (Right, 1967). FAMILY HISTORY Her family history includes Diabetes in her sister; Liver disease in her sister. SOCIAL HISTORY She  reports that she has never smoked. She has never used smokeless tobacco. She reports that she does not drink alcohol and does not use drugs.   ***  Review of Systems  Constitutional: Negative for malaise/fatigue and weight loss.  HENT: Negative for hearing loss and tinnitus.   Eyes: Negative for  blurred vision and double vision.  Respiratory: Negative for cough, sputum production, shortness of breath and wheezing.   Cardiovascular: Negative for chest pain, palpitations, orthopnea, claudication, leg swelling and PND.  Gastrointestinal: Negative for abdominal pain, blood in stool, constipation, diarrhea, heartburn, melena, nausea and vomiting.  Genitourinary: Negative.   Musculoskeletal: Negative for falls, joint pain and myalgias.  Skin: Negative for rash.  Neurological: Negative for dizziness, tingling, sensory change, weakness and headaches.  Endo/Heme/Allergies: Negative for polydipsia.  Psychiatric/Behavioral: Negative.  Negative for depression, memory loss, substance abuse and suicidal ideas. The patient is not nervous/anxious and does not have insomnia.   All other systems reviewed and are negative.  Objective:     There were no vitals taken for this visit.   General Appearance: Well nourished, alert, WD/WN, female and in no apparent distress. Eyes: PERRLA, EOMs, conjunctiva no swelling or erythema Sinuses: No frontal/maxillary tenderness ENT/Mouth: EACs patent / TMs  nl. Nares clear without erythema, swelling, mucoid exudates. Oral hygiene is good. No erythema, swelling, or exudate. Tongue normal, non-obstructing. Tonsils not swollen or erythematous. Hearing normal.  Neck: Supple, thyroid normal. No bruits, nodes or JVD. Respiratory: Respiratory effort normal.  BS equal and clear bilateral without rales, rhonci, wheezing or stridor. Cardio: Heart sounds are normal with regular rate and rhythm and no murmurs, rubs or gallops. Peripheral pulses are normal and equal bilaterally without edema. No aortic or femoral bruits. Chest: symmetric with normal excursions and percussion. Abdomen: Flat, soft  with nl bowel sounds. Nontender, no guarding, rebound, hernias, masses, or organomegaly.  Lymphatics: Non tender without lymphadenopathy.  Musculoskeletal: Full ROM all  peripheral extremities, joint stability, 5/5 strength, and normal gait. Skin: right flank with dry skin, excoriations, fine papules, no vesicles.  Neuro: Cranial nerves intact. DTR's UE  Nl/Equal and DTR's LE's absent.***  Normal muscle tone, no cerebellar symptoms. Sensation decreased bilateral feet. *** Pysch: Alert and oriented X 3, normal affect, Insight and Judgment are somewhat poor Breasts: *** GU: defer   EKG: ***  Medicare Attestation I have personally reviewed: The patient's medical and social history Their use of alcohol, tobacco or illicit drugs Their current medications and supplements The patient's functional ability including ADLs,fall risks, home safety risks, cognitive, and hearing and visual impairment Diet and physical activities Evidence for depression or mood disorders  The patient's weight, height, BMI, and visual acuity have been recorded in the chart.  I have made referrals, counseling, and provided education to the patient based on review of the above and I have provided the patient with a written personalized care plan for preventive services.  Over 40 minutes of exam, counseling, chart review was performed.  Izora Ribas, NP   09/07/2020

## 2020-09-09 ENCOUNTER — Encounter: Payer: Medicare Other | Admitting: Adult Health

## 2020-09-09 DIAGNOSIS — I700 Atherosclerosis of aorta: Secondary | ICD-10-CM

## 2020-09-09 DIAGNOSIS — E1122 Type 2 diabetes mellitus with diabetic chronic kidney disease: Secondary | ICD-10-CM

## 2020-09-09 DIAGNOSIS — Z1389 Encounter for screening for other disorder: Secondary | ICD-10-CM

## 2020-09-09 DIAGNOSIS — Z0001 Encounter for general adult medical examination with abnormal findings: Secondary | ICD-10-CM

## 2020-09-09 DIAGNOSIS — Z9114 Patient's other noncompliance with medication regimen: Secondary | ICD-10-CM

## 2020-09-09 DIAGNOSIS — Z794 Long term (current) use of insulin: Secondary | ICD-10-CM

## 2020-09-09 DIAGNOSIS — F3341 Major depressive disorder, recurrent, in partial remission: Secondary | ICD-10-CM

## 2020-09-09 DIAGNOSIS — E1140 Type 2 diabetes mellitus with diabetic neuropathy, unspecified: Secondary | ICD-10-CM

## 2020-09-09 DIAGNOSIS — E559 Vitamin D deficiency, unspecified: Secondary | ICD-10-CM

## 2020-09-09 DIAGNOSIS — H353211 Exudative age-related macular degeneration, right eye, with active choroidal neovascularization: Secondary | ICD-10-CM

## 2020-09-09 DIAGNOSIS — Z136 Encounter for screening for cardiovascular disorders: Secondary | ICD-10-CM

## 2020-09-09 DIAGNOSIS — Z79899 Other long term (current) drug therapy: Secondary | ICD-10-CM

## 2020-09-09 DIAGNOSIS — Z1329 Encounter for screening for other suspected endocrine disorder: Secondary | ICD-10-CM

## 2020-09-09 DIAGNOSIS — I10 Essential (primary) hypertension: Secondary | ICD-10-CM

## 2020-09-09 DIAGNOSIS — E1169 Type 2 diabetes mellitus with other specified complication: Secondary | ICD-10-CM

## 2020-09-09 DIAGNOSIS — E118 Type 2 diabetes mellitus with unspecified complications: Secondary | ICD-10-CM

## 2020-09-24 DIAGNOSIS — H401131 Primary open-angle glaucoma, bilateral, mild stage: Secondary | ICD-10-CM | POA: Diagnosis not present

## 2020-09-24 DIAGNOSIS — E119 Type 2 diabetes mellitus without complications: Secondary | ICD-10-CM | POA: Diagnosis not present

## 2020-09-24 DIAGNOSIS — H353231 Exudative age-related macular degeneration, bilateral, with active choroidal neovascularization: Secondary | ICD-10-CM | POA: Diagnosis not present

## 2020-09-24 DIAGNOSIS — Z961 Presence of intraocular lens: Secondary | ICD-10-CM | POA: Diagnosis not present

## 2020-09-24 DIAGNOSIS — H04123 Dry eye syndrome of bilateral lacrimal glands: Secondary | ICD-10-CM | POA: Diagnosis not present

## 2020-09-24 DIAGNOSIS — H26491 Other secondary cataract, right eye: Secondary | ICD-10-CM | POA: Diagnosis not present

## 2020-09-24 LAB — HM DIABETES EYE EXAM

## 2020-10-06 ENCOUNTER — Encounter: Payer: Self-pay | Admitting: Internal Medicine

## 2020-10-15 ENCOUNTER — Encounter (INDEPENDENT_AMBULATORY_CARE_PROVIDER_SITE_OTHER): Payer: Medicare Other | Admitting: Ophthalmology

## 2020-10-22 ENCOUNTER — Other Ambulatory Visit: Payer: Self-pay

## 2020-10-22 ENCOUNTER — Ambulatory Visit (INDEPENDENT_AMBULATORY_CARE_PROVIDER_SITE_OTHER): Payer: Medicare Other | Admitting: Ophthalmology

## 2020-10-22 ENCOUNTER — Encounter (INDEPENDENT_AMBULATORY_CARE_PROVIDER_SITE_OTHER): Payer: Self-pay | Admitting: Ophthalmology

## 2020-10-22 DIAGNOSIS — H353211 Exudative age-related macular degeneration, right eye, with active choroidal neovascularization: Secondary | ICD-10-CM

## 2020-10-22 DIAGNOSIS — E1122 Type 2 diabetes mellitus with diabetic chronic kidney disease: Secondary | ICD-10-CM | POA: Diagnosis not present

## 2020-10-22 DIAGNOSIS — N184 Chronic kidney disease, stage 4 (severe): Secondary | ICD-10-CM | POA: Diagnosis not present

## 2020-10-22 DIAGNOSIS — H353124 Nonexudative age-related macular degeneration, left eye, advanced atrophic with subfoveal involvement: Secondary | ICD-10-CM

## 2020-10-22 MED ORDER — BEVACIZUMAB 2.5 MG/0.1ML IZ SOSY
2.5000 mg | PREFILLED_SYRINGE | INTRAVITREAL | Status: AC | PRN
  Administered 2020-10-22: 15:00:00 2.5 mg via INTRAVITREAL

## 2020-10-22 NOTE — Progress Notes (Signed)
10/22/2020     CHIEF COMPLAINT Patient presents for Retina Follow Up (7 Wk F/U OD, poss Avastin OD//Pt sts, "I'm not seeing well at all out of my right eye." Pt denies any other new symptoms OU.)   HISTORY OF PRESENT ILLNESS: [name] is a 87 y.o. female who presents to the clinic today for:   HPI     Retina Follow Up           Diagnosis: Wet AMD   Laterality: right eye   Onset: 7 weeks ago   Severity: moderate   Duration: 7 weeks   Course: gradually worsening   Comments: 7 Wk F/U OD, poss Avastin OD  Pt sts, "I'm not seeing well at all out of my right eye." Pt denies any other new symptoms OU.       Last edited by Milly Jakob, [place] on 10/22/2020  1:50 PM.      Referring physician: [name], MD [address] [place],  [place] [address]  HISTORICAL INFORMATION:   Selected notes from the MEDICAL RECORD NUMBER    Lab Results  Component Value Date   HGBA1C 10.4 (H) 05/21/2020     CURRENT MEDICATIONS: Current Outpatient Medications (Ophthalmic Drugs)  Medication Sig   dorzolamide-timolol (COSOPT) 22.3-6.8 MG/ML ophthalmic solution    latanoprost (XALATAN) 0.005 % ophthalmic solution    LOTEMAX 0.5 % GEL    No current facility-administered medications for this visit. (Ophthalmic Drugs)   Current Outpatient Medications (Other)  Medication Sig   acetaminophen (TYLENOL) 500 MG tablet Take 500-1,000 mg by mouth 3 (three) times daily as needed.   AquaLance Lancets 30G MISC CHECK BLOOD SUGAR 3 TIMES A DAY   aspirin 81 MG tablet Take 81 mg by mouth daily.   benzonatate (TESSALON) 100 MG capsule Take 1 capsule (100 mg total) by mouth every 8 (eight) hours.   bisoprolol-hydrochlorothiazide (ZIAC) 2.5-6.25 MG tablet Take 1 tablet by mouth daily.   Blood Glucose Calibration (ACCU-CHEK AVIVA) SOLN Check blood sugar 3 times daily-DX-E11.22   Blood Glucose Monitoring Suppl (ONETOUCH VERIO FLEX SYSTEM) w/Device KIT Check blood sugar 3 times  daily-E11.8   buPROPion (WELLBUTRIN XL) 150 MG 24 hr tablet Take 1 tablet every Morning for Mood , Focus & Concentration   Cholecalciferol (VITAMIN D3) 2000 UNITS TABS Take 1 capsule by mouth 2 (two) times daily.   COMFORT EZ PEN NEEDLES 32G X 4 MM MISC USE 2 TIMES DAILY AS DIRECTED , 1 PEN NEEDLE PER INJECTION.   Continuous Blood Gluc Sensor (FREESTYLE LIBRE SENSOR SYSTEM) MISC Check blood sugar 3 times a day. Dx:E11.8, Z79.4   Continuous Blood Gluc Sensor MISC Check blood sugar 3 times a day- Dx: E11.8/Z79.4   EASY COMFORT INSULIN SYRINGE 30G X 1/2" 0.5 ML MISC    gabapentin (NEURONTIN) 300 MG capsule Take 1 capsule 3 to 4 x / day for Painful Diabetic Neuropathy   hyoscyamine (LEVSIN SL) 0.125 MG SL tablet Take 1 to 2 tablets 3 to 4 x day if needed for Nausea, vomiting, cramping or diarrhea   insulin isophane & regular human (NOVOLIN 70/30 FLEXPEN RELION) (70-30) 100 UNIT/ML KwikPen Inject 20-24 units subcutaneously with breakfast and 18-20 units dinner as directed for diabetes/high blood sugar. Take additional 10-15 units with lunch only if high carb meal.   lactulose (CHRONULAC) 10 GM/15ML solution Take 15 ml (3 tsp) once or twice daily as needed for constipation.   Lancet Devices (ADJUSTABLE LANCING DEVICE) MISC CHECK BLOOD  SUGAR 3 TIMES A DAY   losartan (COZAAR) 25 MG tablet Take 1 tab daily for blood pressure if blood pressure checked in AM is 140/80+   Magnesium 400 MG TABS Take 1 tablet by mouth daily.   Multiple Vitamins-Minerals (HAIR/SKIN/NAILS) TABS Take by mouth 2 (two) times daily.   ONETOUCH VERIO test strip USE 1 STRIP TO CHECK GLUCOSE THREE TIMES DAILY AS DIRECTED   rOPINIRole (REQUIP) 3 MG tablet Take  1 to 2 tablets  at Bedtime  for "Restless Legs Syndrome"   rosuvastatin (CRESTOR) 20 MG tablet TAKE 1 TABLET BY MOUTH ONCE DAILY FOR CHOLESTEROL   senna-docusate (SENOKOT-S) 8.6-50 MG tablet Take 1 tablet by mouth daily.   vitamin B-12 (CYANOCOBALAMIN) 1000 MCG tablet Take 1,000  mcg by mouth daily.   vitamin C (ASCORBIC ACID) 500 MG tablet Take 500 mg by mouth daily.   zinc gluconate 50 MG tablet Take 50 mg by mouth daily.   No current facility-administered medications for this visit. (Other)      REVIEW OF SYSTEMS:    ALLERGIES Allergies  Allergen Reactions   Morphine And Related Other (See Comments)    fuzzy feeling    PAST MEDICAL HISTORY Past Medical History:  Diagnosis Date   Arthritis    Chronic back pain    Cystoid macular edema of right eye 08/19/2019   Diabetic neuropathy (HCC)    Hypercholesteremia    Hypertension    Liver lesion    Neuropathy    Type II or unspecified type diabetes mellitus without mention of complication, not stated as uncontrolled    Vitamin D deficiency    Wears glasses    Past Surgical History:  Procedure Laterality Date   ABDOMINAL HYSTERECTOMY     APPENDECTOMY     BACK SURGERY  1993   lumb lam-fusion   BREAST EXCISIONAL BIOPSY Right 1967   benign   CESAREAN SECTION     x 3    COLONOSCOPY     EYE SURGERY     both cataracts   TRIGGER FINGER RELEASE  01/10/2012   Procedure: RELEASE TRIGGER FINGER/A-1 PULLEY;  Surgeon: Hessie Dibble, MD;  Location: [facility];  Service: Orthopedics;  Laterality: Right;  right ring trigger release    FAMILY HISTORY Family History  Problem Relation Age of Onset   Liver disease Sister    Diabetes Sister    Colon cancer Neg Hx     SOCIAL HISTORY Social History   Tobacco Use   Smoking status: Never   Smokeless tobacco: Never  Substance Use Topics   Alcohol use: No   Drug use: No         OPHTHALMIC EXAM:  Base Eye Exam     Visual Acuity (ETDRS)       Right Left   Dist cc CF at 3' 20/70 -1   Dist ph cc NI 20/70 +1    Correction: Glasses         Tonometry (Tonopen, 1:55 PM)       Right Left   Pressure 11 13         Pupils       Pupils Dark Light Shape React APD   Right PERRL 2 1 Round Brisk None   Left PERRL 2 1 Round  Brisk None         Visual Fields (Counting fingers)       Left Right    Full    Restrictions  Total inferior  nasal deficiency         Extraocular Movement       Right Left    Full Full         Neuro/Psych     Oriented x3: Yes   Mood/Affect: Normal         Dilation     Right eye: 1.0% Mydriacyl, 2.5% Phenylephrine @ 1:55 PM           Slit Lamp and Fundus Exam     External Exam       Right Left   External Normal Normal         Slit Lamp Exam       Right Left   Lids/Lashes Normal Normal   Conjunctiva/Sclera White and quiet White and quiet   Cornea Clear Clear   Anterior Chamber Deep and quiet Deep and quiet   Iris Round and reactive Round and reactive   Lens Posterior chamber intraocular lens Posterior chamber intraocular lens   Anterior Vitreous Normal Normal         Fundus Exam       Right Left   Posterior Vitreous Posterior vitreous detachment    Disc Normal    C/D Ratio 0.4    Macula Geographic atrophy 3 DA size, Advanced age related macular degeneration, no macular thickening, Retinal pigment epithelial mottling, no exudates, no hemorrhage    Vessels Normal, no DR    Periphery Normal             IMAGING AND PROCEDURES  Imaging and Procedures for 10/22/20  OCT, Retina - OU - Both Eyes       Right Eye Quality was good. Scan locations included subfoveal. Central Foveal Thickness: 305. Progression has been stable. Findings include no IRF, abnormal foveal contour, no SRF, disciform scar, central retinal atrophy, outer retinal atrophy, inner retinal atrophy.   Left Eye Quality was good. Scan locations included subfoveal. Central Foveal Thickness: 246. Findings include abnormal foveal contour, no SRF, no IRF, disciform scar, central retinal atrophy, inner retinal atrophy, outer retinal atrophy.   Notes No signs of active CNVM OS,  With small area of subretinal hemorrhage superior to the FAZ OD.  Intraretinal hemorrhage  temporally as well, overall improved and stable at 7-week interval.         Intravitreal Injection, Pharmacologic Agent - OD - Right Eye       Time Out 10/22/2020. 2:35 PM. Confirmed correct patient, procedure, site, and patient consented.   Anesthesia Topical anesthesia was used. Anesthetic medications included Ak[place] 3.5%.   Procedure Preparation included Tobramycin 0.3%, Ofloxacin , 10% betadine to eyelids, 5% betadine to ocular surface. A 30 gauge needle was used.   Injection: 2.5 mg bevacizumab 2.5 MG/0.1ML   Route: Intravitreal, Site: Right Eye   NDC: [phone], Lot: [id number]   Post-op Post injection exam found visual acuity of at least counting fingers. The patient tolerated the procedure well. There were no complications. The patient received written and verbal post procedure care education. Post injection medications were not given.              ASSESSMENT/PLAN:  Exudative age-related macular degeneration of right eye with active choroidal neovascularization (HCC) Improved as measured by less intraretinal fluid.  Subretinal scarring limits acuity.  No signs of active CNVM after 7-week follow-up post Avastin.  Repeat injection today and monitor again in 8 weeks  Advanced nonexudative age-related macular degeneration of left eye with subfoveal involvement OS with early geographic atrophy  affecting the fovea and acuity  CKD stage 4 due to type 2 diabetes mellitus ([place]) No detectable DR     ICD-10-CM   1. Exudative age-related macular degeneration of right eye with active choroidal neovascularization (HCC)  H35.3211 OCT, Retina - OU - Both Eyes    Intravitreal Injection, Pharmacologic Agent - OD - Right Eye    bevacizumab (AVASTIN) SOSY 2.5 mg    2. 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 CKD stage 4 due to type 2 diabetes mellitus (HCC)  E11.22    N18.4       1.  OD with much  improved anatomy by OCT and examination.  There is no subretinal hemorrhage remaining.  Nonetheless subfoveal scarring and atrophy of the overlying fovea limits acuity.  Currently at 7-week interval.  Repeat injection today and examination again OD next in 8 weeks  2.  OS with no sign of CNVM today  3.  No detectable diabetic retinopathy OU  Ophthalmic Meds Ordered this visit:  Meds ordered this encounter  Medications   bevacizumab (AVASTIN) SOSY 2.5 mg       Return in about 8 weeks (around 12/17/2020) for dilate, OD, AVASTIN OCT.  There are no Patient Instructions on file for this visit.   Explained the diagnoses, plan, and follow up with the patient and they expressed understanding.  Patient expressed understanding of the importance of proper follow up care.   Clent Demark Kathrynne Kulinski M.D. Diseases & Surgery of the Retina and Vitreous Retina & Diabetic [place] 10/22/20     Abbreviations: M myopia (nearsighted); A astigmatism; H hyperopia (farsighted); P presbyopia; Mrx spectacle prescription;  CTL contact lenses; OD right eye; OS left eye; OU both eyes  XT exotropia; ET esotropia; PEK punctate epithelial keratitis; PEE punctate epithelial erosions; DES dry eye syndrome; MGD meibomian gland dysfunction; ATs artificial tears; PFAT's preservative free artificial tears; [place] nuclear sclerotic cataract; PSC posterior subcapsular cataract; ERM epi-retinal membrane; PVD posterior vitreous detachment; RD retinal detachment; DM diabetes mellitus; DR diabetic retinopathy; NPDR non-proliferative diabetic retinopathy; PDR proliferative diabetic retinopathy; CSME clinically significant macular edema; DME diabetic macular edema; dbh dot blot hemorrhages; CWS cotton wool spot; POAG primary open angle glaucoma; C/D cup-to-disc ratio; HVF humphrey visual field; GVF goldmann visual field; OCT optical coherence tomography; IOP intraocular pressure; BRVO Branch retinal vein occlusion; CRVO central retinal vein  occlusion; CRAO central retinal artery occlusion; BRAO branch retinal artery occlusion; RT retinal tear; SB scleral buckle; PPV pars plana vitrectomy; VH Vitreous hemorrhage; PRP panretinal laser photocoagulation; IVK intravitreal kenalog; VMT vitreomacular traction; MH Macular hole;  NVD neovascularization of the disc; NVE neovascularization elsewhere; AREDS age related eye disease study; ARMD age related macular degeneration; POAG primary open angle glaucoma; EBMD epithelial/anterior basement membrane dystrophy; ACIOL anterior chamber intraocular lens; IOL intraocular lens; PCIOL posterior chamber intraocular lens; Phaco/IOL phacoemulsification with intraocular lens placement; [place] photorefractive keratectomy; LASIK laser assisted in situ keratomileusis; HTN hypertension; DM diabetes mellitus; COPD chronic obstructive pulmonary disease

## 2020-10-22 NOTE — Assessment & Plan Note (Signed)
Improved as measured by less intraretinal fluid.  Subretinal scarring limits acuity.  No signs of active CNVM after 7-week follow-up post Avastin.  Repeat injection today and monitor again in 8 weeks

## 2020-10-22 NOTE — Assessment & Plan Note (Signed)
No detectable DR 

## 2020-10-22 NOTE — Assessment & Plan Note (Signed)
OS with early geographic atrophy affecting the fovea and acuity

## 2020-11-10 DIAGNOSIS — M5441 Lumbago with sciatica, right side: Secondary | ICD-10-CM | POA: Diagnosis not present

## 2020-11-20 ENCOUNTER — Encounter: Payer: Self-pay | Admitting: Internal Medicine

## 2020-11-20 ENCOUNTER — Ambulatory Visit (INDEPENDENT_AMBULATORY_CARE_PROVIDER_SITE_OTHER): Payer: Medicare Other | Admitting: Internal Medicine

## 2020-11-20 ENCOUNTER — Other Ambulatory Visit: Payer: Self-pay

## 2020-11-20 VITALS — BP 112/60 | HR 61 | Temp 97.7°F | Ht 63.0 in | Wt 120.0 lb

## 2020-11-20 DIAGNOSIS — M545 Low back pain, unspecified: Secondary | ICD-10-CM | POA: Diagnosis not present

## 2020-11-20 DIAGNOSIS — M7061 Trochanteric bursitis, right hip: Secondary | ICD-10-CM | POA: Diagnosis not present

## 2020-11-20 DIAGNOSIS — M7071 Other bursitis of hip, right hip: Secondary | ICD-10-CM

## 2020-11-20 MED ORDER — DEXAMETHASONE 4 MG PO TABS: ORAL_TABLET | ORAL | 0 refills | Status: DC

## 2020-11-20 NOTE — Progress Notes (Signed)
Future Appointments  Date Time Provider [place]  11/20/2020  9:30 AM [name], MD [facility] None  11/30/2020  4:00 PM Liane Comber, NP [facility] None  12/17/2020  2:15 PM Rankin, Clent Demark, MD [facility] None   History of Present Illness:                Patient is a delightful 87 yo MWF with IDDM who was seen 2 weeks and was treated about 2 weeks ago at a [place] clinic on [address] & treated with a muscle relaxer to no avail .  She describes pain over lateral Rt hip ischial tuberosity area  , Rt Butt area over ischial area.   Medications  Current Outpatient Medications (Endocrine & Metabolic):    insulin isophane & regular human (NOVOLIN 70/30 FLEXPEN RELION) (70-30) 100 UNIT/ML KwikPen, Inject 20-24 units subcutaneously with breakfast and 18-20 units dinner as directed for diabetes/high blood sugar. Take additional 10-15 units with lunch only if high carb meal.  Current Outpatient Medications (Cardiovascular):    bisoprolol-hydrochlorothiazide (ZIAC) 2.5-6.25 MG tablet, Take 1 tablet by mouth daily.   losartan (COZAAR) 25 MG tablet, Take 1 tab daily for blood pressure if blood pressure checked in AM is 140/80+   rosuvastatin (CRESTOR) 20 MG tablet, TAKE 1 TABLET BY MOUTH ONCE DAILY FOR CHOLESTEROL  Current Outpatient Medications (Respiratory):    benzonatate (TESSALON) 100 MG capsule, Take 1 capsule (100 mg total) by mouth every 8 (eight) hours.  Current Outpatient Medications (Analgesics):    acetaminophen (TYLENOL) 500 MG tablet, Take 500-1,000 mg by mouth 3 (three) times daily as needed.   aspirin 81 MG tablet, Take 81 mg by mouth daily.  Current Outpatient Medications (Hematological):    vitamin B-12 (CYANOCOBALAMIN) 1000 MCG tablet, Take 1,000 mcg by mouth daily.  Current Outpatient Medications (Other):    AquaLance Lancets 30G MISC, CHECK BLOOD SUGAR 3 TIMES A DAY   Blood Glucose Calibration (ACCU-CHEK AVIVA) SOLN, Check blood sugar 3 times  daily-DX-E11.22   Blood Glucose Monitoring Suppl (ONETOUCH VERIO FLEX SYSTEM) w/Device KIT, Check blood sugar 3 times daily-E11.8   buPROPion (WELLBUTRIN XL) 150 MG 24 hr tablet, Take 1 tablet every Morning for Mood , Focus & Concentration   Cholecalciferol (VITAMIN D3) 2000 UNITS TABS, Take 1 capsule by mouth 2 (two) times daily.   COMFORT EZ PEN NEEDLES 32G X 4 MM MISC, USE 2 TIMES DAILY AS DIRECTED , 1 PEN NEEDLE PER INJECTION.   Continuous Blood Gluc Sensor (FREESTYLE LIBRE SENSOR SYSTEM) MISC, Check blood sugar 3 times a day. Dx:E11.8, Z79.4   Continuous Blood Gluc Sensor MISC, Check blood sugar 3 times a day- Dx: E11.8/Z79.4   dorzolamide-timolol (COSOPT) 22.3-6.8 MG/ML ophthalmic solution,    EASY COMFORT INSULIN SYRINGE 30G X 1/2" 0.5 ML MISC,    gabapentin (NEURONTIN) 300 MG capsule, Take 1 capsule 3 to 4 x / day for Painful Diabetic Neuropathy   hyoscyamine (LEVSIN SL) 0.125 MG SL tablet, Take 1 to 2 tablets 3 to 4 x day if needed for Nausea, vomiting, cramping or diarrhea   lactulose (CHRONULAC) 10 GM/15ML solution, Take 15 ml (3 tsp) once or twice daily as needed for constipation.   Lancet Devices (ADJUSTABLE LANCING DEVICE) MISC, CHECK BLOOD SUGAR 3 TIMES A DAY   latanoprost (XALATAN) 0.005 % ophthalmic solution,    LOTEMAX 0.5 % GEL,    Magnesium 400 MG TABS, Take 1 tablet by mouth daily.   Multiple Vitamins-Minerals (HAIR/SKIN/NAILS) TABS, Take by mouth 2 (  two) times daily.   ONETOUCH VERIO test strip, USE 1 STRIP TO CHECK GLUCOSE THREE TIMES DAILY AS DIRECTED   rOPINIRole (REQUIP) 3 MG tablet, Take  1 to 2 tablets  at Bedtime  for "Restless Legs Syndrome"   senna-docusate (SENOKOT-S) 8.6-50 MG tablet, Take 1 tablet by mouth daily.   vitamin C (ASCORBIC ACID) 500 MG tablet, Take 500 mg by mouth daily.   zinc gluconate 50 MG tablet, Take 50 mg by mouth daily.  Problem list She has Hypertension; Chronic pain; CKD stage 4 due to type 2 diabetes mellitus ([place]); Hyperlipidemia  associated with type 2 diabetes mellitus ([place]); Vitamin D deficiency; Neuropathy due to type 2 diabetes mellitus ([place]); Medication management; Encounter for Medicare annual wellness exam; Atherosclerosis of aorta ([place]) by AbdCTscan on 06/28/2017; Type 2 diabetes mellitus with complication, with long-term current use of insulin ([place]); Depression, major, recurrent, in partial remission ([place]); History of COVID-19; Long term (current) use of insulin ([place]); Exudative age-related macular degeneration of right eye with active choroidal neovascularization ([place]); Exudative age-related macular degeneration of left eye ([place]); Advanced nonexudative age-related macular degeneration of right eye with subfoveal involvement; Exudative age-related macular degeneration of left eye with inactive choroidal neovascularization ([place]); Memory changes; Poorly controlled type 2 diabetes mellitus ([place]); Poor compliance with medication; Advanced nonexudative age-related macular degeneration of left eye with subfoveal involvement; and Frequent UTI on their problem list.   Observations/Objective:  BP 112/60   Pulse 61   Temp 97.7 F (36.5 C)   Ht 5' 3"  (1.6 m)   Wt 120 lb (54.4 kg)   SpO2 94%   BMI 21.26 kg/m   HEENT - WNL. Neck - supple.  Chest - Clear equal BS. Cor - Nl HS. RRR w/o sig MGR. PP 1(+). No edema. MS- FROM w/o deformities.  Gait Nl w/o limp.             (+) tender over Rt SI jt area., Rt ischial & Greater tuberosity bursae.           Negative SLR bilat. Nl internal/external hip rotation & flexion. Neuro -  Nl w/o focal abnormalities.  Assessment and Plan:   1. Acute right-sided low back pain without sciatica  - dexamethasone  4 MG tablet; Take 1 tab 3 x /day for 2 days,      then 2 x /day for 2  Days,     then 1 tab daily  Dispense: 13 tablet; Refill: 0  2. Trochanteric bursitis of right hip  - dexamethasone 4 MG tablet; Take 1 tab 3 x /day for 2 days,      then 2 x /day for 2  Days,     then 1 tab  daily  Dispense: 13 tablet; Refill: 0  3. Ischial bursitis, right  - dexamethasone  4 MG tablet; Take 1 tab 3 x /day for 2 days,      then 2 x /day for 2  Days,     then 1 tab daily  Dispense: 13 tablet; Refill: 0   - Cautioned patient XT:[id number] effect of steroids & to take extra insulin as needed.    Follow Up Instructions:        I discussed the assessment and treatment plan with the patient. The patient was provided an opportunity to ask questions and all were answered. The patient agreed with the plan and demonstrated an understanding of the instructions.       The patient was advised to call back or  seek an in-person evaluation if the symptoms worsen or if the condition fails to improve as anticipated.    Kirtland Bouchard, MD

## 2020-11-30 ENCOUNTER — Encounter: Payer: Medicare Other | Admitting: Adult Health

## 2020-12-01 DIAGNOSIS — Z20822 Contact with and (suspected) exposure to covid-19: Secondary | ICD-10-CM | POA: Diagnosis not present

## 2020-12-02 DIAGNOSIS — M25552 Pain in left hip: Secondary | ICD-10-CM | POA: Diagnosis not present

## 2020-12-02 DIAGNOSIS — M25551 Pain in right hip: Secondary | ICD-10-CM | POA: Diagnosis not present

## 2020-12-02 NOTE — Progress Notes (Signed)
Patient ID: [name], female   DOB: [birth year], 87 y.o.   MRN: [id number]  CPE  Assessment:    Encounter for Annual Physical Exam with abnormal findings Due annually  Health Maintenance reviewed Healthy lifestyle reviewed and goals set Schedule DEXA with mammogram   Atherosclerosis of aorta ([place])- per CT 06/2017 Control blood pressure, cholesterol, glucose, increase exercise.   Essential hypertension Continue current med DASH diet, exercise and monitor at home. Call if greater than 130/80. -     CBC with Differential/Platelet -     CMP/GFR -     TSH  Uncontrolled secondary diabetes mellitus with stage 3 CKD (GFR 30-59) (HCC)/ Long term current use of insulin ([place]) Discussed general issues about diabetes pathophysiology and management., Educational material distributed., Suggested low cholesterol diet., Encouraged aerobic exercise., Discussed foot care., Reminded to get yearly retinal exam. NO GLUCOSE LOG TO REVIEW; PATIENT CANNOT ADEQUATELY REPORT CURRENT INSULIN USE CONSISTENT/ONGOING POOR COMPLIANCE / POOR CONTROL/ HIGH RISK POOR OUTCOMES I feel she is not appropriate for current insulin regimen due to poor health literacy and compliance Plan to transition to basal insulin with GLP-1ra to simplify regimen and avoid need for self titration; she is in aggreement; given 4 weeks of trulicity 0. 75 mg dose, initial given in office by CMA without complication Add miralax/senokot PRN constipation Due to reported hyperglycemia will continue novolin per sliding scale, will do close follow up in 2 weeks and requested she bring glucose/insulin log with her to review If able will transition to lantus with close follow up  -     Hemoglobin A1c  Ttype 2 diabetes with neuropathy [facility]) Discussed general issues about diabetes pathophysiology and management., Educational material distributed. Encouraged aerobic exercise., Discussed foot care. She takes low dose gabapentin PRN with benefit -      Hemoglobin A1c  Mixed hyperlipidemia associated with T2DM ([place]) - has restarted statin, LDL goal <70 - check lipids, decrease fatty foods, increase activity.  -     Lipid panel  CKD 3 associated with T2DM (HCC) Off of ACE/ARB per nephrology, Dr. Moshe Cipro follows Avoid NSAIDs, encourage regular hydration Needs better control of glucose - see plan above - CMP/GFR, UA, microalbumin   Macular degeneration (HCC) - BIL Getting injections by Dr. Zadie Rhine  Chronic pain syndrome Ortho following, Monitor  Medication management -     Magnesium  Vitamin D deficiency Continue to recommend supplementation for goal of 60-100 Check vitamin D level  Medication management -     CBC with Differential/Platelet -     COMPLETE METABOLIC PANEL WITH GFR -     Magnesium  Major depression in remission (HCC)   low dose wellbutrin for energy and motivation  Lifestyle discussed: diet/exerise, sleep hygiene, stress management, hydration  No orders of the defined types were placed in this encounter.     Future Appointments  Date Time Provider [place]  12/17/2020  2:15 PM Rankin, Clent Demark, MD [facility] None  12/07/2021  2:00 PM Liane Comber, NP [facility] None      Plan:   During the course of the visit the patient was educated and counseled about appropriate screening and preventive services including:   Pneumococcal vaccine  Influenza vaccine Td vaccine Screening electrocardiogram Bone densitometry screening Colorectal cancer screening Diabetes screening Glaucoma screening Nutrition counseling  Advanced directives: requested   Subjective:   [name]  presents for CPE. She has Hypertension; Chronic pain; CKD stage 3 due to type 2 diabetes mellitus ([place]); Hyperlipidemia  associated with type 2 diabetes mellitus ([place]); Vitamin D deficiency; Neuropathy due to type 2 diabetes mellitus ([place]); Medication management; Encounter for Medicare annual wellness exam;  Atherosclerosis of aorta ([place]) by AbdCTscan on 06/28/2017; Type 2 diabetes mellitus with complication, with long-term current use of insulin ([place]); Depression, major, recurrent, in partial remission ([place]); Long term (current) use of insulin ([place]); Exudative age-related macular degeneration of right eye with active choroidal neovascularization ([place]); Exudative age-related macular degeneration of left eye ([place]); Advanced nonexudative age-related macular degeneration of right eye with subfoveal involvement; Exudative age-related macular degeneration of left eye with inactive choroidal neovascularization ([place]); Memory changes; Poorly controlled type 2 diabetes mellitus ([place]); Poor compliance with medication; Advanced nonexudative age-related macular degeneration of left eye with subfoveal involvement; and Frequent UTI on their problem list.  She is married, 3 sons, 8 grandchildren, 4 great grandkids. She is a retired Secretary/administrator. She enjoys gardening though less with heat this year. Denies concerns today.   She has reported unsteady gait, chronic pain, follows with [facility] (Dr. Norm Salt) and [facility], also with diabetic neuropathy. Just had injection into bil hips yesterday.   This past year has been referred for PT for gait training, has cane and encouraged consistent use but only intermitently compliant with this. She does note PT helped and feels more stable.  She has depression with reported lack of motivation and energy, now on wellbutrin 150 mg daily and doing well.   BMI is Body mass index is 21.41 kg/m., she has been working on diet but hasn't been exercising, She could go to church and walk in their gym, plans to go with husband or friend, motivated to start now that hips feel improved. Wt Readings from Last 3 Encounters:  12/03/20 121 lb 12.8 oz (55.2 kg)  11/20/20 120 lb (54.4 kg)  09/03/20 131 lb (59.4 kg)   Patient is treated for HTN since 2000, takes ziac daily, reports  losartan was d/c'd by nephrology. BP today was BP: 128/66  She has aortic atherosclerosis per CT 2017 and 2019. Patient had a negative Cardiolite in 2009. Patient has had no complaints of any cardiac type chest pain, palpitations, dyspnea/orthopnea/PND, dizziness, claudication, or dependent edema.  She has hyperlipidemia and is prescribed, patient has since restarted rosuvastatin 20 mg daily. Patient denies myalgias or other med SE's. Last Lipids were not at goal  Lab Results  Component Value Date   CHOL 253 (H) 05/21/2020   HDL 45 (L) 05/21/2020   LDLCALC 157 (H) 05/21/2020   TRIG 330 (H) 05/21/2020   CHOLHDL 5.6 (H) 05/21/2020   Also, the patient has history of T2DM x 1996 and has been on insulin since 2000 and she has complications w/ CKD and Neuropathy.  Off of metformin due to CKD 3b; on bASA, statin Not on ACE/ARB per recommendation by Dr. Moshe Cipro Has intermittently seen endocrinology though not consistently, recently has been managed by our office though with suspected poor compliance with recommended diet/med regimen/appointments, last seen for diabetes 05/21/2020.  She is on novolin 70/30, 25 units AM, then has sliding scale for PM, takes 28-30 units She reports fasting glucose has been 200+ last few weeks, didn't bring glucose log She has had no symptoms of reactive hypoglycemia, diabetic polys or visual blurring.  She does report tingling and occasional painful paresthesias of the feet.  She takes requip for RLS, reports hemp oil helping with foot pain.  Lab Results  Component Value Date   HGBA1C 10.4 (H) 05/21/2020  She follows with [facility] Dr. Moshe Cipro, advised to stay off of ACE/ARB (caused deterioration) and NSAIDs.  Lab Results  Component Value Date   GFRNONAA 37 (L) 09/02/2020   GFRNONAA 35 (L) 05/21/2020   GFRNONAA 29 (L) 02/04/2020   Further, the patient also has history of Vitamin D Deficiency, unsure how much she takes, "3-4"  Lab Results   Component Value Date   VD25OH 60 05/21/2020   She takes B12 SL, ? 1000 mcg daily, she is unsure of dose Lab Results  Component Value Date   VITAMINB12 >2,000 (H) 12/23/2019     Current Outpatient Medications on File Prior to Visit  Medication Sig   acetaminophen (TYLENOL) 500 MG tablet Take 500-1,000 mg by mouth 3 (three) times daily as needed.   AquaLance Lancets 30G MISC CHECK BLOOD SUGAR 3 TIMES A DAY   aspirin 81 MG tablet Take 81 mg by mouth daily.   benzonatate (TESSALON) 100 MG capsule Take 1 capsule (100 mg total) by mouth every 8 (eight) hours.   bisoprolol-hydrochlorothiazide (ZIAC) 2.5-6.25 MG tablet Take 1 tablet by mouth daily.   Blood Glucose Calibration (ACCU-CHEK AVIVA) SOLN Check blood sugar 3 times daily-DX-E11.22   Blood Glucose Monitoring Suppl (ONETOUCH VERIO FLEX SYSTEM) w/Device KIT Check blood sugar 3 times daily-E11.8   buPROPion (WELLBUTRIN XL) 150 MG 24 hr tablet Take 1 tablet every Morning for Mood , Focus & Concentration   Cholecalciferol (VITAMIN D3) 2000 UNITS TABS Take 1 capsule by mouth 2 (two) times daily.   COMFORT EZ PEN NEEDLES 32G X 4 MM MISC USE 2 TIMES DAILY AS DIRECTED , 1 PEN NEEDLE PER INJECTION.   Continuous Blood Gluc Sensor (FREESTYLE LIBRE SENSOR SYSTEM) MISC Check blood sugar 3 times a day. Dx:E11.8, Z79.4   Continuous Blood Gluc Sensor MISC Check blood sugar 3 times a day- Dx: E11.8/Z79.4   dexamethasone (DECADRON) 4 MG tablet Take 1 tab 3 x /day for 2 days,      then 2 x /day for 2  Days,     then 1 tab daily   dorzolamide-timolol (COSOPT) 22.3-6.8 MG/ML ophthalmic solution    EASY COMFORT INSULIN SYRINGE 30G X 1/2" 0.5 ML MISC    gabapentin (NEURONTIN) 300 MG capsule Take 1 capsule 3 to 4 x / day for Painful Diabetic Neuropathy   hyoscyamine (LEVSIN SL) 0.125 MG SL tablet Take 1 to 2 tablets 3 to 4 x day if needed for Nausea, vomiting, cramping or diarrhea   insulin isophane & regular human (NOVOLIN 70/30 FLEXPEN RELION) (70-30) 100  UNIT/ML KwikPen Inject 20-24 units subcutaneously with breakfast and 18-20 units dinner as directed for diabetes/high blood sugar. Take additional 10-15 units with lunch only if high carb meal.   lactulose (CHRONULAC) 10 GM/15ML solution Take 15 ml (3 tsp) once or twice daily as needed for constipation.   Lancet Devices (ADJUSTABLE LANCING DEVICE) MISC CHECK BLOOD SUGAR 3 TIMES A DAY   latanoprost (XALATAN) 0.005 % ophthalmic solution    losartan (COZAAR) 25 MG tablet Take 1 tab daily for blood pressure if blood pressure checked in AM is 140/80+   LOTEMAX 0.5 % GEL    Magnesium 400 MG TABS Take 1 tablet by mouth daily.   Multiple Vitamins-Minerals (HAIR/SKIN/NAILS) TABS Take by mouth 2 (two) times daily.   ONETOUCH VERIO test strip USE 1 STRIP TO CHECK GLUCOSE THREE TIMES DAILY AS DIRECTED   rOPINIRole (REQUIP) 3 MG tablet Take  1 to 2 tablets  at Bedtime  for "Restless Legs Syndrome"   rosuvastatin (CRESTOR) 20 MG tablet TAKE 1 TABLET BY MOUTH ONCE DAILY FOR CHOLESTEROL   senna-docusate (SENOKOT-S) 8.6-50 MG tablet Take 1 tablet by mouth daily.   vitamin B-12 (CYANOCOBALAMIN) 1000 MCG tablet Take 1,000 mcg by mouth daily.   vitamin C (ASCORBIC ACID) 500 MG tablet Take 500 mg by mouth daily.   zinc gluconate 50 MG tablet Take 50 mg by mouth daily.   No current facility-administered medications on file prior to visit.   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 involvement 01/08/2020   Memory changes 12/31/2019   Poorly controlled type 2 diabetes mellitus ([place]) 12/31/2019   Poor compliance with medication 12/31/2019   Advanced nonexudative age-related macular degeneration of right eye with subfoveal involvement 11/18/2019   Exudative age-related macular degeneration of left eye with inactive choroidal neovascularization ([place]) 11/18/2019   Exudative age-related macular  degeneration of right eye with active choroidal neovascularization ([place]) 08/19/2019   Exudative age-related macular degeneration of left eye ([place]) 08/19/2019   Long term (current) use of insulin ([place]) 06/10/2019   Depression, major, recurrent, in partial remission ([place]) 12/20/2018   Type 2 diabetes mellitus with complication, with long-term current use of insulin ([place]) 02/09/2017   Atherosclerosis of aorta ([place]) by AbdCTscan on 06/28/2017 11/06/2016   Encounter for Medicare annual wellness exam 05/18/2015   Medication management 08/06/2013   Neuropathy due to type 2 diabetes mellitus ([place]) 06/04/2013   CKD stage 3 due to type 2 diabetes mellitus ([place])    Hyperlipidemia associated with type 2 diabetes mellitus ([place])    Vitamin D deficiency    Hypertension 07/29/2012   Chronic pain 07/29/2012    Screening Tests  Immunization History  Administered Date(s) Administered   DT (Pediatric) 01/04/2011   Influenza Split 01/23/2013   Influenza, High Dose Seasonal PF 02/27/2014, 03/11/2015, 12/21/2015, 01/30/2017, 02/08/2018, 01/02/2019   Influenza-Unspecified 02/03/2020   PFIZER(Purple Top)SARS-COV-2 Vaccination 07/22/2019, 08/13/2019   Pneumococcal Conjugate-13 02/27/2014   Pneumococcal Polysaccharide-23 01/06/2012   Preventative care: Last colonoscopy: 2006 will not get another due to age [facility] 12/2019, reminded to schedule DEXA 2007 declines  Tetanus 2012 Flu - reports has already had for 2022 at pharmacy  Pneumo 2013 Prevnar 13 2015 Shingles vaccine declines Covid 19: 2/2, 2021, pfizer + 2 boosters  Names of Other Physician/Practitioners you currently use: 1. [facility] here for primary care 2. Dr[facility] Rankin, eye doctor, last visit in 09/24/2020, no retinopathy, report abstracted,  3. Dr Sissy Hoff, DDS, dentist, last visit 2022, goes q90m Patient Care Team: MUnk Pinto MD as PCP - General (Internal Medicine) GWarden Fillers  MD as Consulting Physician (Optometry) RZadie Rhine GClent Demark MD as Consulting Physician (Ophthalmology) RGlenna Fellows MD as Attending Physician (Neurosurgery) DMelrose Nakayama MD as Consulting Physician (Orthopedic Surgery) JMilus Banister MD as Attending Physician (Gastroenterology)   Allergies Allergies  Allergen Reactions   Morphine And Related Other (See Comments)    fuzzy feeling    SURGICAL HISTORY She  has a past surgical history that includes Abdominal hysterectomy; Cesarean section; Back surgery (1993); Appendectomy; Colonoscopy; Eye surgery; Trigger finger release (01/10/2012); and Breast excisional biopsy (Right, 1967). FAMILY HISTORY Her family history includes Diabetes in her brother and sister; Kidney cancer in her brother; Liver cancer in her sister; Liver disease in her sister; Lymphoma in her sister; Stroke in her  brother. SOCIAL HISTORY She  reports that she has never smoked. She has never used smokeless tobacco. She reports that she does not drink alcohol and does not use drugs.  Review of Systems  Constitutional:  Negative for malaise/fatigue and weight loss.  HENT:  Negative for hearing loss and tinnitus.   Eyes:  Negative for blurred vision and double vision.  Respiratory:  Negative for cough, shortness of breath and wheezing.   Cardiovascular:  Negative for chest pain, palpitations, orthopnea, claudication and leg swelling.  Gastrointestinal:  Positive for constipation. Negative for abdominal pain, blood in stool, diarrhea, heartburn, melena, nausea and vomiting.  Genitourinary: Negative.   Musculoskeletal:  Positive for back pain (ortho/neuro following) and joint pain (hips, improved after injections). Negative for myalgias.  Skin:  Negative for rash.  Neurological:  Negative for dizziness, tingling, sensory change, weakness and headaches.  Endo/Heme/Allergies:  Negative for polydipsia.  Psychiatric/Behavioral: Negative.  Negative for depression, memory loss and  substance abuse. The patient is not nervous/anxious and does not have insomnia.   All other systems reviewed and are negative.   Objective:     Blood pressure 128/66, pulse 67, temperature (!) 97.5 F (36.4 C), height 5' 3.25" (1.607 m), weight 121 lb 12.8 oz (55.2 kg), SpO2 97 %.  General Appearance: Well nourished, alert, WD/WN, female elder  in no apparent distress. Eyes: PERRLA, EOMs, conjunctiva no swelling or erythema Sinuses: No frontal/maxillary tenderness ENT/Mouth: EACs patent / TMs  nl. Nares clear without erythema, swelling, mucoid exudates. Oral hygiene is good. No erythema, swelling, or exudate. Tongue normal, non-obstructing. Tonsils not swollen or erythematous. Hearing normal.  Neck: Supple, thyroid normal without palpable nodules. No bruits, nodes or JVD. Respiratory: Respiratory effort normal.  BS equal and clear bilateral without rales, rhonci, wheezing or stridor. Cardio: Heart sounds are normal with regular rate and rhythm and no murmurs, rubs or gallops. Peripheral pulses are normal and equal bilaterally without edema. No aortic or femoral bruits. Chest: symmetric with normal excursions and percussion. Breasts: Breasts: symmetric fibrous changes in both upper outer quadrants, dense and irregular throughout. No rash, superficial texture change, nipples symmetrical without retraction or discharge. Abdomen: Flat, soft  with nl bowel sounds. Nontender, no guarding, rebound, hernias, masses, or organomegaly.  Lymphatics: Non tender without lymphadenopathy.  Musculoskeletal: Full ROM all peripheral extremities, joint stability, 5/5 strength, and slow steady gait.  Skin: warm/dry intact Neuro: Cranial nerves intact. DTR's UE  Nl/Equal and DTR's LE's absent. Normal muscle tone, no cerebellar symptoms. Sensation decreased R foot to monofilament.  Pysch: Alert and oriented X 3, normal affect, Insight and Judgment are fair GU: defer, no concerns  EKG: WNL, NSCPT  The patient's  weight, height, BMI  have been recorded in the chart.  I have made referrals, counseling, and provided education to the patient based on review of the above and I have provided the patient with a written personalized care plan for preventive services.  Over 40 minutes of exam, counseling, chart review was performed.  Izora Ribas, NP   12/03/2020

## 2020-12-03 ENCOUNTER — Other Ambulatory Visit: Payer: Self-pay

## 2020-12-03 ENCOUNTER — Encounter: Payer: Self-pay | Admitting: Adult Health

## 2020-12-03 ENCOUNTER — Ambulatory Visit (INDEPENDENT_AMBULATORY_CARE_PROVIDER_SITE_OTHER): Payer: Medicare Other | Admitting: Adult Health

## 2020-12-03 VITALS — BP 128/66 | HR 67 | Temp 97.5°F | Ht 63.25 in | Wt 121.8 lb

## 2020-12-03 DIAGNOSIS — I10 Essential (primary) hypertension: Secondary | ICD-10-CM

## 2020-12-03 DIAGNOSIS — E1122 Type 2 diabetes mellitus with diabetic chronic kidney disease: Secondary | ICD-10-CM

## 2020-12-03 DIAGNOSIS — Z0000 Encounter for general adult medical examination without abnormal findings: Secondary | ICD-10-CM | POA: Diagnosis not present

## 2020-12-03 DIAGNOSIS — E785 Hyperlipidemia, unspecified: Secondary | ICD-10-CM

## 2020-12-03 DIAGNOSIS — Z0001 Encounter for general adult medical examination with abnormal findings: Secondary | ICD-10-CM

## 2020-12-03 DIAGNOSIS — H353211 Exudative age-related macular degeneration, right eye, with active choroidal neovascularization: Secondary | ICD-10-CM

## 2020-12-03 DIAGNOSIS — E2839 Other primary ovarian failure: Secondary | ICD-10-CM

## 2020-12-03 DIAGNOSIS — Z91148 Patient's other noncompliance with medication regimen for other reason: Secondary | ICD-10-CM

## 2020-12-03 DIAGNOSIS — Z79899 Other long term (current) drug therapy: Secondary | ICD-10-CM

## 2020-12-03 DIAGNOSIS — Z1329 Encounter for screening for other suspected endocrine disorder: Secondary | ICD-10-CM

## 2020-12-03 DIAGNOSIS — E559 Vitamin D deficiency, unspecified: Secondary | ICD-10-CM

## 2020-12-03 DIAGNOSIS — I700 Atherosclerosis of aorta: Secondary | ICD-10-CM | POA: Diagnosis not present

## 2020-12-03 DIAGNOSIS — Z136 Encounter for screening for cardiovascular disorders: Secondary | ICD-10-CM

## 2020-12-03 DIAGNOSIS — E538 Deficiency of other specified B group vitamins: Secondary | ICD-10-CM

## 2020-12-03 DIAGNOSIS — F325 Major depressive disorder, single episode, in full remission: Secondary | ICD-10-CM

## 2020-12-03 DIAGNOSIS — Z9114 Patient's other noncompliance with medication regimen: Secondary | ICD-10-CM

## 2020-12-03 DIAGNOSIS — Z794 Long term (current) use of insulin: Secondary | ICD-10-CM

## 2020-12-03 DIAGNOSIS — Z6821 Body mass index (BMI) 21.0-21.9, adult: Secondary | ICD-10-CM

## 2020-12-03 DIAGNOSIS — E118 Type 2 diabetes mellitus with unspecified complications: Secondary | ICD-10-CM

## 2020-12-03 DIAGNOSIS — E1140 Type 2 diabetes mellitus with diabetic neuropathy, unspecified: Secondary | ICD-10-CM

## 2020-12-03 DIAGNOSIS — N183 Chronic kidney disease, stage 3 unspecified: Secondary | ICD-10-CM | POA: Diagnosis not present

## 2020-12-03 DIAGNOSIS — H353222 Exudative age-related macular degeneration, left eye, with inactive choroidal neovascularization: Secondary | ICD-10-CM

## 2020-12-03 DIAGNOSIS — R413 Other amnesia: Secondary | ICD-10-CM

## 2020-12-03 DIAGNOSIS — E1169 Type 2 diabetes mellitus with other specified complication: Secondary | ICD-10-CM | POA: Diagnosis not present

## 2020-12-03 DIAGNOSIS — F3341 Major depressive disorder, recurrent, in partial remission: Secondary | ICD-10-CM

## 2020-12-03 DIAGNOSIS — G894 Chronic pain syndrome: Secondary | ICD-10-CM

## 2020-12-03 DIAGNOSIS — N390 Urinary tract infection, site not specified: Secondary | ICD-10-CM

## 2020-12-03 MED ORDER — TRULICITY 0.75 MG/0.5ML ~~LOC~~ SOAJ: 0.7500 mg | SUBCUTANEOUS | 0 refills | Status: DC

## 2020-12-03 NOTE — Patient Instructions (Addendum)
Please send information for flu vaccine and covid 19 boosters   Trulicity - start 6.14 mg injection into skin of stomach once a week Store if fridge  Please follow up in 2 weeks with glucose/inulin log and bring all meds/supplements with you   Try senakot to help move bowels if needed in addition to stool softener   Dulaglutide (trulicity) Injection What is this medication? DULAGLUTIDE (DOO la GLOO tide) controls blood sugar in people with type 2 diabetes. It is used with lifestyle changes like diet and exercise. It may lower the risk of problems that need treatment in the hospital. These problemsinclude heart attack or stroke. This medicine may be used for other purposes; ask your health care provider orpharmacist if you have questions. COMMON BRAND NAME(S): Trulicity What should I tell my care team before I take this medication? They need to know if you have any of these conditions: endocrine tumors (MEN 2) or if someone in your family had these tumors eye disease, vision problems history of pancreatitis kidney disease liver disease stomach or intestine problems thyroid cancer or if someone in your family had thyroid cancer an unusual or allergic reaction to dulaglutide, other medicines, foods, dyes, or preservatives pregnant or trying to get pregnant breast-feeding How should I use this medication? This medicine is injected under the skin. You will be taught how to prepare and give it. Take it as directed on the prescription label on the same day of each week. Do NOT prime the pen. Keep taking it unless your health care providertells you to stop. If you use this medicine with insulin, you should inject this medicine and the insulin separately. Do not mix them together. Do not give the injections rightnext to each other. Change (rotate) injection sites with each injection. This drug comes with INSTRUCTIONS FOR USE. Ask your pharmacist for directions on how to use this medicine. Read  the information carefully. Talk to yourpharmacist or health care provider if you have questions. It is important that you put your used needles and syringes in a special sharps container. Do not put them in a trash can. If you do not have a sharpscontainer, call your pharmacist or health care provider to get one. A special MedGuide will be given to you by the pharmacist with eachprescription and refill. Be sure to read this information carefully each time. Talk to your health care provider about the use of this medicine in children.Special care may be needed. Overdosage: If you think you have taken too much of this medicine contact apoison control center or emergency room at once. NOTE: This medicine is only for you. Do not share this medicine with others. What if I miss a dose? If you miss a dose, take it as soon as you can unless it is more than 3 days late. If it is more than 3 days late, skip the missed dose. Take the next doseat the normal time. What may interact with this medication? other medicines for diabetes Many medications may cause changes in blood sugar, these include: alcohol containing beverages antiviral medicines for HIV or AIDS aspirin and aspirin-like drugs certain medicines for blood pressure, heart disease, irregular heart beat chromium diuretics female hormones, such as estrogens or progestins, birth control pills fenofibrate gemfibrozil isoniazid lanreotide female hormones or anabolic steroids MAOIs like Carbex, Eldepryl, Marplan, Nardil, and Parnate medicines for allergies, asthma, cold, or cough medicines for depression, anxiety, or psychotic disturbances medicines for weight loss niacin nicotine NSAIDs, medicines for pain and  inflammation, like ibuprofen or naproxen octreotide pasireotide pentamidine phenytoin probenecid quinolone antibiotics such as ciprofloxacin, levofloxacin, ofloxacin some herbal dietary supplements steroid medicines such as  prednisone or cortisone sulfamethoxazole; trimethoprim thyroid hormones Some medications can hide the warning symptoms of low blood sugar (hypoglycemia). You may need to monitor your blood sugar more closely if youare taking one of these medications. These include: beta-blockers, often used for high blood pressure or heart problems (examples include atenolol, metoprolol, propranolol) clonidine guanethidine reserpine This list may not describe all possible interactions. Give your health care provider a list of all the medicines, herbs, non-prescription drugs, or dietary supplements you use. Also tell them if you smoke, drink alcohol, or use illegaldrugs. Some items may interact with your medicine. What should I watch for while using this medication? Visit your health care provider for regular checks on your progress. Check with your health care provider if you have severe diarrhea, nausea, and vomiting, or if you sweat a lot. The loss of too much body fluid may make itdangerous for you to take this medicine. A test called the HbA1C (A1C) will be monitored. This is a simple blood test. It measures your blood sugar control over the last 2 to 3 months. You willreceive this test every 3 to 6 months. Learn how to check your blood sugar. Learn the symptoms of low and high bloodsugar and how to manage them. Always carry a quick-source of sugar with you in case you have symptoms of low blood sugar. Examples include hard sugar candy or glucose tablets. Make sure others know that you can choke if you eat or drink when you develop serious symptoms of low blood sugar, such as seizures or unconsciousness. Get medicalhelp at once. Tell your health care provider if you have high blood sugar. You might need to change the dose of your medicine. If you are sick or exercising more thanusual, you may need to change the dose of your medicine. Do not skip meals. Ask your health care provider if you should avoid alcohol.  Many nonprescription cough and cold products contain sugar or alcohol. Thesecan affect blood sugar. Pens should never be shared. Even if the needle is changed, sharing may resultin passing of viruses like hepatitis or HIV. Wear a medical ID bracelet or chain. Carry a card that describes yourcondition. List the medicines and doses you take on the card. What side effects may I notice from receiving this medication? Side effects that you should report to your doctor or health care professionalas soon as possible: allergic reactions (skin rash, itching or hives; swelling of the face, lips, or tongue) changes in vision diarrhea that continues or is severe infection (fever, chills, cough, sore throat, pain or trouble passing urine) kidney injury (trouble passing urine or change in the amount of urine) low blood sugar (feeling anxious; confusion; dizziness; increased hunger; unusually weak or tired; increased sweating; shakiness; cold, clammy skin; irritable; headache; blurred vision; fast heartbeat; loss of consciousness) lump or swelling on the neck trouble breathing trouble swallowing unusual stomach upset or pain vomiting Side effects that usually do not require medical attention (report to yourdoctor or health care professional if they continue or are bothersome): lack or loss of appetite nausea pain, redness, or irritation at site where injected This list may not describe all possible side effects. Call your doctor for medical advice about side effects. You may report side effects to FDA at1-800-FDA-1088. Where should I keep my medication? Keep out of the reach of children and  pets. Refrigeration (preferred): Store unopened pens in a refrigerator between 2 and 8 degrees C (36 and 46 degrees F). Keep it in the original carton until you are ready to take it. Do not freeze or use if the medicine has been frozen. Protect from light. Get rid of any unused medicine after the expiration date on  thelabel. Room Temperature: The pen may be stored at room temperature below 30 degrees C (86 degrees F) for up to a total of 14 days if needed. Protect from light. Avoid exposure to extreme heat. If it is stored at room temperature, throw awayany unused medicine after 14 days or after it expires, whichever is first. To get rid of medicines that are no longer needed or have expired: Take the medicine to a medicine take-back program. Check with your pharmacy or law enforcement to find a location. If you cannot return the medicine, ask your pharmacist or health care provider how to get rid of this medicine safely. NOTE: This sheet is a summary. It may not cover all possible information. If you have questions about this medicine, talk to your doctor, pharmacist, orhealth care provider.  2022 Elsevier/Gold Standard (2020-02-10 07:35:51)

## 2020-12-04 ENCOUNTER — Other Ambulatory Visit: Payer: Self-pay | Admitting: Adult Health

## 2020-12-07 LAB — URINALYSIS, ROUTINE W REFLEX MICROSCOPIC
Bacteria, UA: NONE SEEN /HPF
Bilirubin Urine: NEGATIVE
Hgb urine dipstick: NEGATIVE
Ketones, ur: NEGATIVE
Nitrite: NEGATIVE
Protein, ur: NEGATIVE
RBC / HPF: NONE SEEN /HPF (ref 0–2)
Specific Gravity, Urine: 1.02 (ref 1.001–1.035)
pH: <=5 (ref 5.0–8.0)

## 2020-12-07 LAB — CBC WITH DIFFERENTIAL/PLATELET
Absolute Monocytes: 799 cells/uL (ref 200–950)
Basophils Absolute: 59 cells/uL (ref 0–200)
Basophils Relative: 0.2 %
Eosinophils Absolute: 0 cells/uL — ABNORMAL LOW (ref 15–500)
Eosinophils Relative: 0 %
HCT: 41 % (ref 35.0–45.0)
Hemoglobin: 13.6 g/dL (ref 11.7–15.5)
Lymphs Abs: 2338 cells/uL (ref 850–3900)
MCH: 31 pg (ref 27.0–33.0)
MCHC: 33.2 g/dL (ref 32.0–36.0)
MCV: 93.4 fL (ref 80.0–100.0)
MPV: 12.7 fL — ABNORMAL HIGH (ref 7.5–12.5)
Monocytes Relative: 2.7 %
Neutro Abs: 26403 cells/uL — ABNORMAL HIGH (ref 1500–7800)
Neutrophils Relative %: 89.2 %
Platelets: 287 10*3/uL (ref 140–400)
RBC: 4.39 10*6/uL (ref 3.80–5.10)
RDW: 12 % (ref 11.0–15.0)
Total Lymphocyte: 7.9 %
WBC: 29.6 10*3/uL — ABNORMAL HIGH (ref 3.8–10.8)

## 2020-12-07 LAB — HEMOGLOBIN A1C
Hgb A1c MFr Bld: 13.9 % of total Hgb — ABNORMAL HIGH (ref <5.7)
Mean Plasma Glucose: 352 mg/dL
eAG (mmol/L): 19.5 mmol/L

## 2020-12-07 LAB — MAGNESIUM: Magnesium: 2 mg/dL (ref 1.5–2.5)

## 2020-12-07 LAB — LIPID PANEL
Cholesterol: 198 mg/dL (ref <200)
HDL: 56 mg/dL (ref >=50)
LDL Cholesterol (Calc): 115 mg/dL (calc) — ABNORMAL HIGH
Non-HDL Cholesterol (Calc): 142 mg/dL (calc) — ABNORMAL HIGH (ref <130)
Total CHOL/HDL Ratio: 3.5 (calc) (ref <5.0)
Triglycerides: 157 mg/dL — ABNORMAL HIGH (ref <150)

## 2020-12-07 LAB — COMPLETE METABOLIC PANEL WITH GFR
AG Ratio: 1.3 (calc) (ref 1.0–2.5)
ALT: 16 U/L (ref 6–29)
AST: 22 U/L (ref 10–35)
Albumin: 4.3 g/dL (ref 3.6–5.1)
Alkaline phosphatase (APISO): 100 U/L (ref 37–153)
BUN/Creatinine Ratio: 21 (calc) (ref 6–22)
BUN: 34 mg/dL — ABNORMAL HIGH (ref 7–25)
CO2: 29 mmol/L (ref 20–32)
Calcium: 10.6 mg/dL — ABNORMAL HIGH (ref 8.6–10.4)
Chloride: 96 mmol/L — ABNORMAL LOW (ref 98–110)
Creat: 1.65 mg/dL — ABNORMAL HIGH (ref 0.60–0.95)
Globulin: 3.4 g/dL (calc) (ref 1.9–3.7)
Glucose, Bld: 184 mg/dL — ABNORMAL HIGH (ref 65–99)
Potassium: 4.3 mmol/L (ref 3.5–5.3)
Sodium: 139 mmol/L (ref 135–146)
Total Bilirubin: 0.4 mg/dL (ref 0.2–1.2)
Total Protein: 7.7 g/dL (ref 6.1–8.1)
eGFR: 30 mL/min/{1.73_m2} — ABNORMAL LOW (ref >=60)

## 2020-12-07 LAB — TEST AUTHORIZATION

## 2020-12-07 LAB — MICROSCOPIC MESSAGE

## 2020-12-07 LAB — TSH: TSH: 0.29 mIU/L — ABNORMAL LOW (ref 0.40–4.50)

## 2020-12-07 LAB — PATHOLOGIST SMEAR REVIEW

## 2020-12-07 LAB — VITAMIN B12: Vitamin B-12: 777 pg/mL (ref 200–1100)

## 2020-12-07 LAB — MICROALBUMIN / CREATININE URINE RATIO
Creatinine, Urine: 95 mg/dL (ref 20–275)
Microalb Creat Ratio: 7 mcg/mg creat (ref <30)
Microalb, Ur: 0.7 mg/dL

## 2020-12-07 LAB — VITAMIN D 25 HYDROXY (VIT D DEFICIENCY, FRACTURES): Vit D, 25-Hydroxy: 67 ng/mL (ref 30–100)

## 2020-12-09 DIAGNOSIS — M545 Low back pain, unspecified: Secondary | ICD-10-CM | POA: Diagnosis not present

## 2020-12-17 ENCOUNTER — Other Ambulatory Visit: Payer: Self-pay

## 2020-12-17 ENCOUNTER — Encounter (INDEPENDENT_AMBULATORY_CARE_PROVIDER_SITE_OTHER): Payer: Self-pay | Admitting: Ophthalmology

## 2020-12-17 ENCOUNTER — Ambulatory Visit (INDEPENDENT_AMBULATORY_CARE_PROVIDER_SITE_OTHER): Payer: Medicare Other | Admitting: Ophthalmology

## 2020-12-17 DIAGNOSIS — H353114 Nonexudative age-related macular degeneration, right eye, advanced atrophic with subfoveal involvement: Secondary | ICD-10-CM | POA: Diagnosis not present

## 2020-12-17 DIAGNOSIS — H353222 Exudative age-related macular degeneration, left eye, with inactive choroidal neovascularization: Secondary | ICD-10-CM

## 2020-12-17 DIAGNOSIS — H353211 Exudative age-related macular degeneration, right eye, with active choroidal neovascularization: Secondary | ICD-10-CM | POA: Diagnosis not present

## 2020-12-17 MED ORDER — BEVACIZUMAB 2.5 MG/0.1ML IZ SOSY
2.5000 mg | PREFILLED_SYRINGE | INTRAVITREAL | Status: AC | PRN
  Administered 2020-12-17: 2.5 mg via INTRAVITREAL

## 2020-12-17 NOTE — Assessment & Plan Note (Signed)
Central atrophy accounts for acuity 

## 2020-12-17 NOTE — Assessment & Plan Note (Signed)
History of CNVM OS no sign of recurrence

## 2020-12-17 NOTE — Progress Notes (Signed)
12/17/2020     CHIEF COMPLAINT Patient presents for  Chief Complaint  Patient presents with   Retina Follow Up      HISTORY OF PRESENT ILLNESS: [name] is a 87 y.o. female who presents to the clinic today for:   HPI     Retina Follow Up   Patient presents with  Wet AMD.  In right eye.  This started 8 weeks ago.  Severity is mild.  Duration of 8 weeks.  Since onset it is stable.        Comments   8 week f/u OD, OCT, Avastin OD  Pt states vision seems to be pretty stable in the right eye since previous visit. Pt denies any new floaters or FOL. Pt denies dark curtain/veil. Pt denies any pain in or around the eye.   Type 2 Diabetic. Diagnosed for at least 10-15 years. A1c: unsure LBS:190 (this am)  Eye Meds: Cosopt BID OU Latanoprost QHS OU      Last edited by Reather Littler, COA on 12/17/2020  2:39 PM.      Referring physician: [name], MD [address] [place],  [place] [address]  HISTORICAL INFORMATION:   Selected notes from the MEDICAL RECORD NUMBER    Lab Results  Component Value Date   HGBA1C 13.9 (H) 12/03/2020     CURRENT MEDICATIONS: Current Outpatient Medications (Ophthalmic Drugs)  Medication Sig   dorzolamide-timolol (COSOPT) 22.3-6.8 MG/ML ophthalmic solution    latanoprost (XALATAN) 0.005 % ophthalmic solution    LOTEMAX 0.5 % GEL    No current facility-administered medications for this visit. (Ophthalmic Drugs)   Current Outpatient Medications (Other)  Medication Sig   acetaminophen (TYLENOL) 500 MG tablet Take 500-1,000 mg by mouth 3 (three) times daily as needed.   AquaLance Lancets 30G MISC CHECK BLOOD SUGAR 3 TIMES A DAY   aspirin 81 MG tablet Take 81 mg by mouth daily.   benzonatate (TESSALON) 100 MG capsule Take 1 capsule (100 mg total) by mouth every 8 (eight) hours.   bisoprolol-hydrochlorothiazide (ZIAC) 2.5-6.25 MG tablet Take 1 tablet by mouth daily.   Blood Glucose Calibration (ACCU-CHEK  AVIVA) SOLN Check blood sugar 3 times daily-DX-E11.22   Blood Glucose Monitoring Suppl (ONETOUCH VERIO FLEX SYSTEM) w/Device KIT Check blood sugar 3 times daily-E11.8   buPROPion (WELLBUTRIN XL) 150 MG 24 hr tablet Take 1 tablet every Morning for Mood , Focus & Concentration   Cholecalciferol (VITAMIN D3) 2000 UNITS TABS Take 1 capsule by mouth 2 (two) times daily.   COMFORT EZ PEN NEEDLES 32G X 4 MM MISC USE 2 TIMES DAILY AS DIRECTED , 1 PEN NEEDLE PER INJECTION.   Continuous Blood Gluc Sensor (FREESTYLE LIBRE SENSOR SYSTEM) MISC Check blood sugar 3 times a day. Dx:E11.8, Z79.4   Continuous Blood Gluc Sensor MISC Check blood sugar 3 times a day- Dx: E11.8/Z79.4   dexamethasone (DECADRON) 4 MG tablet Take 1 tab 3 x /day for 2 days,      then 2 x /day for 2  Days,     then 1 tab daily   Dulaglutide (TRULICITY) 6.31 SH/7.85YO SOPN Inject 0.75 mg into the skin once a week.   EASY COMFORT INSULIN SYRINGE 30G X 1/2" 0.5 ML MISC    gabapentin (NEURONTIN) 300 MG capsule Take 1 capsule 3 to 4 x / day for Painful Diabetic Neuropathy   hyoscyamine (LEVSIN SL) 0.125 MG SL tablet Take 1 to 2 tablets 3 to 4 x  day if needed for Nausea, vomiting, cramping or diarrhea   insulin isophane & regular human (NOVOLIN 70/30 FLEXPEN RELION) (70-30) 100 UNIT/ML KwikPen Inject 20-24 units subcutaneously with breakfast and 18-20 units dinner as directed for diabetes/high blood sugar. Take additional 10-15 units with lunch only if high carb meal.   lactulose (CHRONULAC) 10 GM/15ML solution Take 15 ml (3 tsp) once or twice daily as needed for constipation.   Lancet Devices (ADJUSTABLE LANCING DEVICE) MISC CHECK BLOOD SUGAR 3 TIMES A DAY   Magnesium 400 MG TABS Take 1 tablet by mouth daily.   Multiple Vitamins-Minerals (HAIR/SKIN/NAILS) TABS Take by mouth 2 (two) times daily.   ONETOUCH VERIO test strip USE 1 STRIP TO CHECK GLUCOSE THREE TIMES DAILY AS DIRECTED   rOPINIRole (REQUIP) 3 MG tablet Take  1 to 2 tablets  at Bedtime   for "Restless Legs Syndrome"   rosuvastatin (CRESTOR) 20 MG tablet TAKE 1 TABLET BY MOUTH ONCE DAILY FOR CHOLESTEROL   senna-docusate (SENOKOT-S) 8.6-50 MG tablet Take 1 tablet by mouth daily.   vitamin B-12 (CYANOCOBALAMIN) 1000 MCG tablet Take 1,000 mcg by mouth daily.   vitamin C (ASCORBIC ACID) 500 MG tablet Take 500 mg by mouth daily.   zinc gluconate 50 MG tablet Take 50 mg by mouth daily.   No current facility-administered medications for this visit. (Other)      REVIEW OF SYSTEMS:    ALLERGIES Allergies  Allergen Reactions   Morphine And Related Other (See Comments)    fuzzy feeling    PAST MEDICAL HISTORY Past Medical History:  Diagnosis Date   Arthritis    Chronic back pain    Cystoid macular edema of right eye 08/19/2019   Diabetic neuropathy (HCC)    Hypercholesteremia    Hypertension    Liver lesion    Neuropathy    Type II or unspecified type diabetes mellitus without mention of complication, not stated as uncontrolled    Vitamin D deficiency    Wears glasses    Past Surgical History:  Procedure Laterality Date   ABDOMINAL HYSTERECTOMY     APPENDECTOMY     BACK SURGERY  1993   lumb lam-fusion   BREAST EXCISIONAL BIOPSY Right 1967   benign   CESAREAN SECTION     x 3    COLONOSCOPY     EYE SURGERY     both cataracts   TRIGGER FINGER RELEASE  01/10/2012   Procedure: RELEASE TRIGGER FINGER/A-1 PULLEY;  Surgeon: Hessie Dibble, MD;  Location: [facility];  Service: Orthopedics;  Laterality: Right;  right ring trigger release    FAMILY HISTORY Family History  Problem Relation Age of Onset   Diabetes Sister    Liver disease Sister    Liver cancer Sister    Lymphoma Sister    Stroke Brother    Diabetes Brother    Kidney cancer Brother    Colon cancer Neg Hx     SOCIAL HISTORY Social History   Tobacco Use   Smoking status: Never   Smokeless tobacco: Never  Substance Use Topics   Alcohol use: No   Drug use: No          OPHTHALMIC EXAM:  Base Eye Exam     Visual Acuity (ETDRS)       Right Left   Dist cc CF_0  20/50 +2   Dist ph cc 20/400 +1 NI    Correction: Glasses         Tonometry (Tonopen, 2:47  PM)       Right Left   Pressure 14 14         Pupils       Pupils Dark Light Shape React APD   Right PERRL 2 1 Round Brisk None   Left PERRL 2 1 Round Brisk None         Visual Fields       Left Right    Full    Restrictions  Total inferior nasal deficiency         Extraocular Movement       Right Left    Full, Ortho Full, Ortho         Neuro/Psych     Oriented x3: Yes   Mood/Affect: Normal         Dilation     Right eye: 1.0% Mydriacyl, 2.5% Phenylephrine @ 2:47 PM           Slit Lamp and Fundus Exam     External Exam       Right Left   External Normal Normal         Slit Lamp Exam       Right Left   Lids/Lashes Normal Normal   Conjunctiva/Sclera White and quiet White and quiet   Cornea Clear Clear   Anterior Chamber Deep and quiet Deep and quiet   Iris Round and reactive Round and reactive   Lens Posterior chamber intraocular lens Posterior chamber intraocular lens   Anterior Vitreous Normal Normal         Fundus Exam       Right Left   Posterior Vitreous Posterior vitreous detachment    Disc Normal    C/D Ratio 0.4    Macula Geographic atrophy 3 DA size, Advanced age related macular degeneration, no macular thickening, Retinal pigment epithelial mottling, no exudates, no hemorrhage    Vessels Normal, no DR    Periphery Normal             IMAGING AND PROCEDURES  Imaging and Procedures for 12/17/20  OCT, Retina - OU - Both Eyes       Right Eye Quality was good. Scan locations included subfoveal. Central Foveal Thickness: 305. Progression has been stable. Findings include no IRF, abnormal foveal contour, no SRF, disciform scar, central retinal atrophy, outer retinal atrophy, inner retinal atrophy.   Left Eye Quality  was good. Scan locations included subfoveal. Central Foveal Thickness: 275. Findings include abnormal foveal contour, no SRF, no IRF, disciform scar, central retinal atrophy, inner retinal atrophy, outer retinal atrophy.   Notes No signs of active CNVM OS,  With small area of subretinal hemorrhage superior to the FAZ OD.  Intraretinal hemorrhage temporally as well, overall improved and stable at 8-week interval.         Intravitreal Injection, Pharmacologic Agent - OD - Right Eye       Time Out 12/17/2020. 3:24 PM. Confirmed correct patient, procedure, site, and patient consented.   Anesthesia Topical anesthesia was used. Anesthetic medications included Akten 3.5%.   Procedure Preparation included Tobramycin 0.3%, Ofloxacin , 10% betadine to eyelids, 5% betadine to ocular surface. A 30 gauge needle was used.   Injection: 2.5 mg bevacizumab 2.5 MG/0.1ML   Route: Intravitreal, Site: Right Eye   NDC: [phone], Lot: [id number]   Post-op Post injection exam found visual acuity of at least counting fingers. The patient tolerated the procedure well. There were no complications. The patient received written and verbal post procedure care  education. Post injection medications were not given.              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  Repeat injection today and examination next in 10-week  Advanced nonexudative age-related macular degeneration of right eye with subfoveal involvement Central atrophy accounts for acuity      ICD-10-CM   1. Exudative age-related macular degeneration of right eye with active choroidal neovascularization (HCC)  H35.3211 OCT, Retina - OU - Both Eyes     Intravitreal Injection, Pharmacologic Agent - OD - Right Eye    bevacizumab (AVASTIN) SOSY 2.5 mg    2. Exudative age-related macular degeneration of left eye with inactive choroidal neovascularization ([place])  H35.3222     3. Advanced nonexudative age-related macular degeneration of right eye with subfoveal involvement  H35.3114       1.  2.  3.  Ophthalmic Meds Ordered this visit:  Meds ordered this encounter  Medications   bevacizumab (AVASTIN) SOSY 2.5 mg       Return in about 10 weeks (around 02/25/2021) for DILATE OU, AVASTIN OCT, OD.  There are no Patient Instructions on file for this visit.   Explained the diagnoses, plan, and follow up with the patient and they expressed understanding.  Patient expressed understanding of the importance of proper follow up care.   Clent Demark Ayala Ribble M.D. Diseases & Surgery of the Retina and Vitreous [facility] [place] 12/17/20     Abbreviations: M myopia (nearsighted); A astigmatism; H hyperopia (farsighted); P presbyopia; Mrx spectacle prescription;  CTL contact lenses; OD right eye; OS left eye; OU both eyes  XT exotropia; ET esotropia; PEK punctate epithelial keratitis; PEE punctate epithelial erosions; DES dry eye syndrome; MGD meibomian gland dysfunction; ATs artificial tears; PFAT's preservative free artificial tears; [place] nuclear sclerotic cataract; PSC posterior subcapsular cataract; ERM epi-retinal membrane; PVD posterior vitreous detachment; RD retinal detachment; DM diabetes mellitus; DR diabetic retinopathy; NPDR non-proliferative diabetic retinopathy; PDR proliferative diabetic retinopathy; CSME clinically significant macular edema; DME diabetic macular edema; dbh dot blot hemorrhages; CWS cotton wool spot; POAG primary open angle glaucoma; C/D cup-to-disc ratio; HVF humphrey visual field; GVF goldmann visual field; OCT optical coherence tomography; IOP intraocular pressure; BRVO Branch retinal vein occlusion; CRVO  central retinal vein occlusion; CRAO central retinal artery occlusion; BRAO branch retinal artery occlusion; RT retinal tear; SB scleral buckle; PPV pars plana vitrectomy; VH Vitreous hemorrhage; PRP panretinal laser photocoagulation; IVK intravitreal kenalog; VMT vitreomacular traction; MH Macular hole;  NVD neovascularization of the disc; NVE neovascularization elsewhere; AREDS age related eye disease study; ARMD age related macular degeneration; POAG primary open angle glaucoma; EBMD epithelial/anterior basement membrane dystrophy; ACIOL anterior chamber intraocular lens; IOL intraocular lens; PCIOL posterior chamber intraocular lens; Phaco/IOL phacoemulsification with intraocular lens placement; [place] photorefractive keratectomy; LASIK laser assisted in situ keratomileusis; HTN hypertension; DM diabetes mellitus; COPD chronic obstructive pulmonary disease

## 2020-12-17 NOTE — Assessment & Plan Note (Signed)
Current interval of 8-week follow-up post Avastin.  Repeat injection today and examination next in 10-week

## 2020-12-18 ENCOUNTER — Other Ambulatory Visit: Payer: Self-pay | Admitting: Adult Health

## 2020-12-18 DIAGNOSIS — E1165 Type 2 diabetes mellitus with hyperglycemia: Secondary | ICD-10-CM

## 2020-12-18 DIAGNOSIS — E118 Type 2 diabetes mellitus with unspecified complications: Secondary | ICD-10-CM

## 2020-12-18 MED ORDER — TRULICITY 1.5 MG/0.5ML ~~LOC~~ SOAJ: 1.5000 mg | SUBCUTANEOUS | 0 refills | Status: DC

## 2020-12-23 ENCOUNTER — Ambulatory Visit (INDEPENDENT_AMBULATORY_CARE_PROVIDER_SITE_OTHER): Payer: Medicare Other | Admitting: Adult Health

## 2020-12-23 ENCOUNTER — Other Ambulatory Visit: Payer: Self-pay

## 2020-12-23 ENCOUNTER — Encounter: Payer: Self-pay | Admitting: Adult Health

## 2020-12-23 VITALS — BP 98/48 | HR 70 | Temp 97.3°F | Wt 115.0 lb

## 2020-12-23 DIAGNOSIS — R6889 Other general symptoms and signs: Secondary | ICD-10-CM | POA: Diagnosis not present

## 2020-12-23 DIAGNOSIS — Z0000 Encounter for general adult medical examination without abnormal findings: Secondary | ICD-10-CM

## 2020-12-23 DIAGNOSIS — E1140 Type 2 diabetes mellitus with diabetic neuropathy, unspecified: Secondary | ICD-10-CM | POA: Diagnosis not present

## 2020-12-23 DIAGNOSIS — E1122 Type 2 diabetes mellitus with diabetic chronic kidney disease: Secondary | ICD-10-CM

## 2020-12-23 DIAGNOSIS — H353222 Exudative age-related macular degeneration, left eye, with inactive choroidal neovascularization: Secondary | ICD-10-CM | POA: Diagnosis not present

## 2020-12-23 DIAGNOSIS — G894 Chronic pain syndrome: Secondary | ICD-10-CM

## 2020-12-23 DIAGNOSIS — E1169 Type 2 diabetes mellitus with other specified complication: Secondary | ICD-10-CM

## 2020-12-23 DIAGNOSIS — E559 Vitamin D deficiency, unspecified: Secondary | ICD-10-CM

## 2020-12-23 DIAGNOSIS — E118 Type 2 diabetes mellitus with unspecified complications: Secondary | ICD-10-CM

## 2020-12-23 DIAGNOSIS — Z79899 Other long term (current) drug therapy: Secondary | ICD-10-CM

## 2020-12-23 DIAGNOSIS — F325 Major depressive disorder, single episode, in full remission: Secondary | ICD-10-CM

## 2020-12-23 DIAGNOSIS — I700 Atherosclerosis of aorta: Secondary | ICD-10-CM

## 2020-12-23 DIAGNOSIS — I10 Essential (primary) hypertension: Secondary | ICD-10-CM | POA: Diagnosis not present

## 2020-12-23 DIAGNOSIS — E785 Hyperlipidemia, unspecified: Secondary | ICD-10-CM

## 2020-12-23 DIAGNOSIS — R413 Other amnesia: Secondary | ICD-10-CM | POA: Diagnosis not present

## 2020-12-23 DIAGNOSIS — D72829 Elevated white blood cell count, unspecified: Secondary | ICD-10-CM

## 2020-12-23 DIAGNOSIS — Z0001 Encounter for general adult medical examination with abnormal findings: Secondary | ICD-10-CM

## 2020-12-23 DIAGNOSIS — Z794 Long term (current) use of insulin: Secondary | ICD-10-CM | POA: Diagnosis not present

## 2020-12-23 DIAGNOSIS — Z9114 Patient's other noncompliance with medication regimen: Secondary | ICD-10-CM

## 2020-12-23 DIAGNOSIS — E1165 Type 2 diabetes mellitus with hyperglycemia: Secondary | ICD-10-CM | POA: Diagnosis not present

## 2020-12-23 DIAGNOSIS — N183 Chronic kidney disease, stage 3 unspecified: Secondary | ICD-10-CM

## 2020-12-23 LAB — COMPLETE METABOLIC PANEL WITH GFR
AG Ratio: 1.2 (calc) (ref 1.0–2.5)
ALT: 8 U/L (ref 6–29)
AST: 16 U/L (ref 10–35)
Albumin: 3.6 g/dL (ref 3.6–5.1)
Alkaline phosphatase (APISO): 73 U/L (ref 37–153)
BUN/Creatinine Ratio: 13 (calc) (ref 6–22)
BUN: 21 mg/dL (ref 7–25)
CO2: 31 mmol/L (ref 20–32)
Calcium: 10 mg/dL (ref 8.6–10.4)
Chloride: 100 mmol/L (ref 98–110)
Creat: 1.61 mg/dL — ABNORMAL HIGH (ref 0.60–0.95)
Globulin: 3 g/dL (calc) (ref 1.9–3.7)
Glucose, Bld: 150 mg/dL — ABNORMAL HIGH (ref 65–99)
Potassium: 5 mmol/L (ref 3.5–5.3)
Sodium: 139 mmol/L (ref 135–146)
Total Bilirubin: 0.5 mg/dL (ref 0.2–1.2)
Total Protein: 6.6 g/dL (ref 6.1–8.1)
eGFR: 31 mL/min/{1.73_m2} — ABNORMAL LOW (ref >=60)

## 2020-12-23 LAB — CBC WITH DIFFERENTIAL/PLATELET
Absolute Monocytes: 1086 cells/uL — ABNORMAL HIGH (ref 200–950)
Basophils Absolute: 89 cells/uL (ref 0–200)
Basophils Relative: 1 %
Eosinophils Absolute: 552 cells/uL — ABNORMAL HIGH (ref 15–500)
Eosinophils Relative: 6.2 %
HCT: 38.7 % (ref 35.0–45.0)
Hemoglobin: 12.9 g/dL (ref 11.7–15.5)
Lymphs Abs: 2296 cells/uL (ref 850–3900)
MCH: 30.9 pg (ref 27.0–33.0)
MCHC: 33.3 g/dL (ref 32.0–36.0)
MCV: 92.8 fL (ref 80.0–100.0)
MPV: 11.6 fL (ref 7.5–12.5)
Monocytes Relative: 12.2 %
Neutro Abs: 4877 cells/uL (ref 1500–7800)
Neutrophils Relative %: 54.8 %
Platelets: 355 10*3/uL (ref 140–400)
RBC: 4.17 10*6/uL (ref 3.80–5.10)
RDW: 12 % (ref 11.0–15.0)
Total Lymphocyte: 25.8 %
WBC: 8.9 10*3/uL (ref 3.8–10.8)

## 2020-12-23 MED ORDER — NOVOLIN 70/30 FLEXPEN RELION (70-30) 100 UNIT/ML ~~LOC~~ SUPN: PEN_INJECTOR | SUBCUTANEOUS | 0 refills | Status: DC

## 2020-12-23 NOTE — Patient Instructions (Addendum)
Take only 18 units of insulin twice daily.  DO NOT TAKE THREE TIMES A DAY   PLEASE CALL WITH ANY GLUCOSE LESS THAN 100  Most concerned about avoiding low sugars with the new med  LOW sugar is more dangerous in the short term than high sugar  PLEASE BRING GLUCOSE LOG NEXT VISIT   Please check your blood pressure before taking bisopolol/hydrochlorothiazide (blood pressure med) and skip if running <110/60.  Resume taking if running above 130/80.    PLEASE SCHEDULE YOUR BONE DENSITY AND MAMMOGRAM   [facility]  7 a.m.-6:30 p.m., Monday 7 a.m.-5 p.m., Tuesday-Friday Schedule an appointment by calling [phone].     Hypoglycemia Hypoglycemia is when the sugar (glucose) level in your blood is too low. Low blood sugar can happen to people who have diabetes and people who do not have diabetes. Low blood sugar can happen quickly, and it can be an emergency. What are the causes? This condition happens most often in people who have diabetes. It may be caused by: Diabetes medicine. Not eating enough, or not eating often enough. Doing more physical activity. Drinking alcohol on an empty stomach. If you do not have diabetes, this condition may be caused by: A tumor in the pancreas. Not eating enough, or not eating for long periods at a time (fasting). A very bad infection or illness. Problems after having weight loss (bariatric) surgery. Kidney failure or liver failure. Certain medicines. What increases the risk? This condition is more likely to develop in people who: Have diabetes and take medicines to lower their blood sugar. Abuse alcohol. Have a very bad illness. What are the signs or symptoms? Mild Hunger. Sweating and feeling clammy. Feeling dizzy or light-headed. Being sleepy or having trouble sleeping. Feeling like you may vomit (nauseous). A fast heartbeat. A headache. Blurry vision. Mood changes, such as: Being grouchy. Feeling  worried or nervous (anxious). Tingling or loss of feeling (numbness) around your mouth, lips, or tongue. Moderate Confusion and poor judgment. Behavior changes. Weakness. Uneven heartbeat. Trouble with moving (coordination). Very low Very low blood sugar (severe hypoglycemia) is a medical emergency. It can cause: Fainting. Seizures. Loss of consciousness (coma). Death. How is this treated? Treating low blood sugar Low blood sugar is often treated by eating or drinking something that has sugar in it right away. The food or drink should contain 15 grams of a fast-acting carb (carbohydrate). Options include: 4 oz (120 mL) of fruit juice. 4 oz (120 mL) of regular soda (not diet soda). A few pieces of hard candy. Check food labels to see how many pieces to eat for 15 grams. 1 Tbsp (15 mL) of sugar or honey. 4 glucose tablets. 1 tube of glucose gel. Treating low blood sugar if you have diabetes If you can think clearly and swallow safely, follow the 15:15 rule: Take 15 grams of a fast-acting carb. Talk with your doctor about how much you should take. Always keep a source of fast-acting carb with you, such as: Glucose tablets (take 4 tablets). A few pieces of hard candy. Check food labels to see how many pieces to eat for 15 grams. 4 oz (120 mL) of fruit juice. 4 oz (120 mL) of regular soda (not diet soda). 1 Tbsp (15 mL) of honey or sugar. 1 tube of glucose gel. Check your blood sugar 15 minutes after you take the carb. If your blood sugar is still at or below 70 mg/dL (3.9 mmol/L), take 15 grams  of a carb again. If your blood sugar does not go above 70 mg/dL (3.9 mmol/L) after 3 tries, get help right away. After your blood sugar goes back to normal, eat a meal or a snack within 1 hour.  Treating very low blood sugar If your blood sugar is below 54 mg/dL (3 mmol/L), you have very low blood sugar, or severe hypoglycemia. This is an emergency. Get medical help right away. If you have  very low blood sugar and you cannot eat or drink, you will need to be given a hormone called glucagon. A family member or friend should learn how to check your blood sugar and how to give you glucagon. Ask your doctor if you need to have an emergency glucagon kit at home. Very low blood sugar may also need to be treated in a hospital. Follow these instructions at home: General instructions Take over-the-counter and prescription medicines only as told by your doctor. Stay aware of your blood sugar as told by your doctor. If you drink alcohol: Limit how much you have to: 0-1 drink a day for women who are not pregnant. 0-2 drinks a day for men. Know how much alcohol is in your drink. In the [place] one drink equals one 12 oz bottle of beer (355 mL), one 5 oz glass of wine (148 mL), or one 1 oz glass of hard liquor (44 mL). Be sure to eat food when you drink alcohol. Know that your body absorbs alcohol quickly. This may lead to low blood sugar later. Be sure to keep checking your blood sugar. Keep all follow-up visits. If you have diabetes:  Always have a fast-acting carb (15 grams) with you to treat low blood sugar. Follow your diabetes care plan as told by your doctor. Make sure you: Know the symptoms of low blood sugar. Check your blood sugar as often as told. Always check it before and after exercise. Always check your blood sugar before you drive. Take your medicines as told. Follow your meal plan. Eat on time. Do not skip meals. Share your diabetes care plan with: Your work or school. People you live with. Carry a card or wear jewelry that says you have diabetes. Where to find more information American Diabetes Association: www.diabetes.org Contact a doctor if: You have trouble keeping your blood sugar in your target range. You have low blood sugar often. Get help right away if: You still have symptoms after you eat or drink something that contains 15 grams of fast-acting carb, and  you cannot get your blood sugar above 70 mg/dL by following the 15:15 rule. Your blood sugar is below 54 mg/dL (3 mmol/L). You have a seizure. You faint. These symptoms may be an emergency. Get help right away. Call your local emergency services (911 in the [place] Do not wait to see if the symptoms will go away. Do not drive yourself to the hospital. Summary Hypoglycemia happens when the level of sugar (glucose) in your blood is too low. Low blood sugar can happen to people who have diabetes and people who do not have diabetes. Low blood sugar can happen quickly, and it can be an emergency. Make sure you know the symptoms of low blood sugar and know how to treat it. Always keep a source of sugar (fast-acting carb) with you to treat low blood sugar. This information is not intended to replace advice given to you by your health care provider. Make sure you discuss any questions you have with your health  care provider. Document Revised: 03/12/2020 Document Reviewed: 03/12/2020 [employer] Patient Education  2022 [employer].

## 2020-12-23 NOTE — Progress Notes (Signed)
Patient ID: [name], female   DOB: 04-Nov-1929, 87 y.o.   MRN: [id number]  2 WEEK FOLLOW UP, ANNUAL WELLNESS VISIT  Assessment:    Annual Medicare Wellness Visit Due annually  Health maintenance reviewed Schedule DEXA with mammogram   Atherosclerosis of aorta ([place])- per CT 06/2017 Control blood pressure, cholesterol, glucose, increase exercise.   Essential hypertension Hypotensive - persistent - HOLD ziac unless trending up above 130/80, check BP BID and bring log close follow up  DASH diet, exercise and monitor at home.  Uncontrolled secondary diabetes mellitus with stage 3 CKD (GFR 30-59) (HCC)/ Long term current use of insulin ([place]) Discussed general issues about diabetes pathophysiology and management., Educational material distributed., Suggested low cholesterol diet., Encouraged aerobic exercise., Discussed foot care., Reminded to get yearly retinal exam. A1C q35m NO GLUCOSE LOG TO REVIEW;  CONSISTENT/ONGOING POOR COMPLIANCE / POOR CONTROL/ HIGH RISK POOR OUTCOMES I feel she is not appropriate for current insulin regimen due to poor health literacy and compliance Plan to transition to basal insulin with GLP-1ra to simplify regimen and avoid need for self titration;  STOP TID dosing, advised 18 units BID, reduce by 2 units any glucose <100, call for further instructions CLOSE FOLLOW UP in 2 weeks or sooner LOW SUGAR IS MORE DANGEROUS THAN HIGH SUGAR EMPHASIZED  Close follow up in 2 weeks and repeated request she bring glucose/insulin log with her to review.  If able will transition to lantus with close follow up   Ttype 2 diabetes with neuropathy ([facility], [place] Discussed general issues about diabetes pathophysiology and management., Educational material distributed. Encouraged aerobic exercise., Discussed foot care. She takes low dose gabapentin PRN with benefit  Mixed hyperlipidemia associated with T2DM ([place] - has restarted statin, LDL goal <70 - check lipids q331mdecrease  fatty foods, increase activity.   CKD 3 associated with T2DM ([place] ACE/ARB per nephrology, Dr. GoMoshe Ciproollows Avoid NSAIDs, encourage regular hydration Needs better control of glucose - see plan above Check CMP/GFR at routine visits  Macular degeneration ([place]- BIL Getting injections by Dr. RaZadie RhineChronic pain syndrome Ortho following, Monitor  Medication management Each visit  Vitamin D deficiency Continue to recommend supplementation for goal of 60-100 Check vitamin D level annually and after dose change  Major depression in remission ([place]  low dose wellbutrin for energy and motivation  Lifestyle discussed: diet/exerise, sleep hygiene, stress management, hydration  Leukocytosis Unexplained, recheck CBC, if persistently elevated check leukemia/lymphoma panel  Orders Placed This Encounter  Procedures   CBC with Differential/Platelet   COMPLETE METABOLIC PANEL WITH GFR    Future Appointments  Date Time Provider [place]/14/2022  3:30 PM CoLiane ComberNP GAAM-GAAIM None  02/25/2021  2:30 PM [place], GaClent DemarkMD RDE-RDE None  12/07/2021  2:00 PM CoLiane ComberNP GA[facility]GAAIM None  12/23/2021 11:00 AM CoLiane ComberNP GAAM-GAAIM None      Plan:   During the course of the visit the patient was educated and counseled about appropriate screening and preventive services including:   Pneumococcal vaccine  Influenza vaccine Td vaccine Screening electrocardiogram Bone densitometry screening Colorectal cancer screening Diabetes screening Glaucoma screening Nutrition counseling  Advanced directives: requested   Subjective:   Kelsey SNUFFERpresents for AWV and 2 week follow up for poorly controlled T2DM on insulin with complications.    She has reported unsteady gait, chronic pain, follows [facility], also with diabetic neuropathy. Just had  injection into bil hips yesterday. This past  year has been referred for PT for gait training, has cane and encouraged consistent use but only intermitently compliant with this. She does note PT helped and feels much more stable, denies falls since.   She has depression with reported lack of motivation and energy, now on wellbutrin 150 mg daily and doing well.   BMI is Body mass index is 20.21 kg/m., she has been working on diet but hasn't been exercising, She could go to church and walk in their gym, plans to go with husband or friend, motivated to start now that hips feel improved. Wt Readings from Last 3 Encounters:  12/23/20 115 lb (52.2 kg)  12/03/20 121 lb 12.8 oz (55.2 kg)  11/20/20 120 lb (54.4 kg)   Patient is treated for HTN since 2000, takes ziac daily, reports losartan was d/c'd by nephrology. BP today was BP: (!) 98/48  She has aortic atherosclerosis per CT 2017 and 2019. Patient had a negative Cardiolite in 2009. Patient has had no complaints of any cardiac type chest pain, palpitations, dyspnea/orthopnea/PND, dizziness, claudication, or dependent edema.  She has hyperlipidemia; patient reported has recently restarted rosuvastatin 20 mg daily. Patient denies myalgias or other med SE's. Last Lipids were not at goal  Lab Results  Component Value Date   CHOL 198 12/03/2020   HDL 56 12/03/2020   LDLCALC 115 (H) 12/03/2020   TRIG 157 (H) 12/03/2020   CHOLHDL 3.5 12/03/2020   Also, the patient has history of T2DM x 1996 and has been on insulin since 2000 and she has complications w/ CKD and Neuropathy.  Off of metformin due to CKD 3b; on bASA, statin Not on ACE/ARB per recommendation by nephrology Dr. Moshe Cipro Has intermittently seen endocrinology though not consistently, recently has been managed by our office though with suspected poor compliance with recommended diet/med regimen/appointments She is on novolin 70/30, today reports has been taking 18-20 units with each meal, 2-3 times a day. But very unclear  Did not  bring glucose log Reports fasting has improved with trulicity 1.63 mg weekly, hasn't had any above 150 Unsure if any less than 100, doesn't recall hypoglycemia episode She has had no symptoms of reactive hypoglycemia, diabetic polys or visual blurring.  She does report tingling and occasional painful paresthesias of the feet.  She takes requip for RLS, reports hemp oil helping with foot pain.  Lab Results  Component Value Date   HGBA1C 13.9 (H) 12/03/2020   She follows with [facility] Dr. Moshe Cipro, advised to stay off of ACE/ARB (caused deterioration) and NSAIDs.  Lab Results  Component Value Date   GFRNONAA 37 (L) 09/02/2020   GFRNONAA 35 (L) 05/21/2020   GFRNONAA 29 (L) 02/04/2020   Further, the patient also has history of Vitamin D Deficiency, unsure how much she takes, "3-4"  Lab Results  Component Value Date   VD25OH 67 12/03/2020   She takes B12 SL, ? 1000 mcg daily, she is unsure of dose Lab Results  Component Value Date   [id number] 017 12/03/2020   She had unexplained leukocytosis last OV, path smear without clear abnormality, she did have joint injection the day prior  CBC Latest Ref Rng & Units 12/03/2020 09/02/2020 05/21/2020  WBC 3.8 - 10.8 Thousand/uL 29.6(H) 12.6(H) 10.0  Hemoglobin 11.7 - 15.5 g/dL 13.6 13.1 13.4  Hematocrit 35.0 - 45.0 % 41.0 40.6 39.5  Platelets 140 - 400 Thousand/uL 287 298 281       Current  Outpatient Medications on File Prior to Visit  Medication Sig   acetaminophen (TYLENOL) 500 MG tablet Take 500-1,000 mg by mouth 3 (three) times daily as needed.   AquaLance Lancets 30G MISC CHECK BLOOD SUGAR 3 TIMES A DAY   aspirin 81 MG tablet Take 81 mg by mouth daily.   benzonatate (TESSALON) 100 MG capsule Take 1 capsule (100 mg total) by mouth every 8 (eight) hours.   bisoprolol-hydrochlorothiazide (ZIAC) 2.5-6.25 MG tablet Take 1 tablet by mouth daily.   Blood Glucose Calibration (ACCU-CHEK AVIVA) SOLN Check blood sugar 3 times  daily-DX-E11.22   Blood Glucose Monitoring Suppl (ONETOUCH VERIO FLEX SYSTEM) w/Device KIT Check blood sugar 3 times daily-E11.8   buPROPion (WELLBUTRIN XL) 150 MG 24 hr tablet Take 1 tablet every Morning for Mood , Focus & Concentration   Cholecalciferol (VITAMIN D3) 2000 UNITS TABS Take 1 capsule by mouth 2 (two) times daily.   COMFORT EZ PEN NEEDLES 32G X 4 MM MISC USE 2 TIMES DAILY AS DIRECTED , 1 PEN NEEDLE PER INJECTION.   Continuous Blood Gluc Sensor (FREESTYLE LIBRE SENSOR SYSTEM) MISC Check blood sugar 3 times a day. Dx:E11.8, Z79.4   Continuous Blood Gluc Sensor MISC Check blood sugar 3 times a day- Dx: E11.8/Z79.4   dexamethasone (DECADRON) 4 MG tablet Take 1 tab 3 x /day for 2 days,      then 2 x /day for 2  Days,     then 1 tab daily   dorzolamide-timolol (COSOPT) 22.3-6.8 MG/ML ophthalmic solution    Dulaglutide (TRULICITY) 1.5 TK/1.6WF SOPN Inject 1.5 mg into the skin once a week.   EASY COMFORT INSULIN SYRINGE 30G X 1/2" 0.5 ML MISC    gabapentin (NEURONTIN) 300 MG capsule Take 1 capsule 3 to 4 x / day for Painful Diabetic Neuropathy   hyoscyamine (LEVSIN SL) 0.125 MG SL tablet Take 1 to 2 tablets 3 to 4 x day if needed for Nausea, vomiting, cramping or diarrhea   lactulose (CHRONULAC) 10 GM/15ML solution Take 15 ml (3 tsp) once or twice daily as needed for constipation.   Lancet Devices (ADJUSTABLE LANCING DEVICE) MISC CHECK BLOOD SUGAR 3 TIMES A DAY   latanoprost (XALATAN) 0.005 % ophthalmic solution    LOTEMAX 0.5 % GEL    Magnesium 400 MG TABS Take 1 tablet by mouth daily.   Multiple Vitamins-Minerals (HAIR/SKIN/NAILS) TABS Take by mouth 2 (two) times daily.   ONETOUCH VERIO test strip USE 1 STRIP TO CHECK GLUCOSE THREE TIMES DAILY AS DIRECTED   rOPINIRole (REQUIP) 3 MG tablet Take  1 to 2 tablets  at Bedtime  for "Restless Legs Syndrome"   rosuvastatin (CRESTOR) 20 MG tablet TAKE 1 TABLET BY MOUTH ONCE DAILY FOR CHOLESTEROL   senna-docusate (SENOKOT-S) 8.6-50 MG tablet Take  1 tablet by mouth daily.   vitamin B-12 (CYANOCOBALAMIN) 1000 MCG tablet Take 1,000 mcg by mouth daily.   vitamin C (ASCORBIC ACID) 500 MG tablet Take 500 mg by mouth daily.   zinc gluconate 50 MG tablet Take 50 mg by mouth daily.   No current facility-administered medications on file prior to visit.   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 involvement 01/08/2020   Memory changes 12/31/2019   Poorly controlled type 2 diabetes mellitus ([place]) 12/31/2019   Poor compliance with medication 12/31/2019   Advanced nonexudative age-related macular degeneration of right eye with subfoveal involvement 11/18/2019  Exudative age-related macular degeneration of left eye with inactive choroidal neovascularization ([place]) 11/18/2019   Exudative age-related macular degeneration of right eye with active choroidal neovascularization ([place]) 08/19/2019   Exudative age-related macular degeneration of left eye ([place]) 08/19/2019   Long term (current) use of insulin ([place]) 06/10/2019   Major depression in remission ([place]) 12/20/2018   Type 2 diabetes mellitus with complication, with long-term current use of insulin ([place]) 02/09/2017   Atherosclerosis of aorta ([place]) by AbdCTscan on 06/28/2017 11/06/2016   Encounter for Medicare annual wellness exam 05/18/2015   Medication management 08/06/2013   Neuropathy due to type 2 diabetes mellitus ([place]) 06/04/2013   CKD stage 3 due to type 2 diabetes mellitus ([place])    Hyperlipidemia associated with type 2 diabetes mellitus ([place])    Vitamin D deficiency    Hypertension 07/29/2012   Chronic pain 07/29/2012    Screening Tests  Immunization History  Administered Date(s) Administered   DT (Pediatric) 01/04/2011   Influenza Split 01/23/2013   Influenza, High Dose Seasonal PF 02/27/2014, 03/11/2015, 12/21/2015, 01/30/2017, 02/08/2018, 01/02/2019    Influenza-Unspecified 02/03/2020   PFIZER(Purple Top)SARS-COV-2 Vaccination 07/22/2019, 08/13/2019   Pneumococcal Conjugate-13 02/27/2014   Pneumococcal Polysaccharide-23 01/06/2012   Preventative care: Last colonoscopy: 2006 will not get another due to age [facility] 12/2019, reminded to schedule at breast center DEXA 2007 order placed to schedule  Tetanus 2012 Flu - reports has already had for 2022 at pharmacy  Pneumo 2013 Prevnar 13 2015 Shingles vaccine declines Covid 19: 2/2, 2021, pfizer + 2 boosters  Names of Other Physician/Practitioners you currently use: 1. [facility] here for primary care 2. Dr[facility], eye doctor, last visit in 09/24/2020, no retinopathy, report abstracted,  3. Dr Sissy Hoff, DDS, dentist, last visit 2022, goes q54m Patient Care Team: MUnk Pinto MD as PCP - General (Internal Medicine) GWarden Fillers MD as Consulting Physician (Optometry) RZadie Rhine GClent Demark MD as Consulting Physician (Ophthalmology) RGlenna Fellows MD as Attending Physician (Neurosurgery) DMelrose Nakayama MD as Consulting Physician (Orthopedic Surgery) JMilus Banister MD as Attending Physician (Gastroenterology)   Allergies Allergies  Allergen Reactions   Morphine And Related Other (See Comments)    fuzzy feeling    SURGICAL HISTORY She  has a past surgical history that includes Abdominal hysterectomy; Cesarean section; Back surgery (1993); Appendectomy; Colonoscopy; Eye surgery; Trigger finger release (01/10/2012); and Breast excisional biopsy (Right, 1967). FAMILY HISTORY Her family history includes Diabetes in her brother and sister; Kidney cancer in her brother; Liver cancer in her sister; Liver disease in her sister; Lymphoma in her sister; Stroke in her brother. SOCIAL HISTORY She  reports that she has never smoked. She has never used smokeless tobacco. She reports that she does not drink alcohol and does not use  drugs.  MEDICARE WELLNESS OBJECTIVES: Physical activity: Current Exercise Habits: The patient does not participate in regular exercise at present, Exercise limited by: orthopedic condition(s) Cardiac risk factors: Cardiac Risk Factors include: advanced age (>549m, >6>87omen);dyslipidemia;hypertension;sedentary lifestyle;diabetes mellitus Depression/mood screen:   [facility] 12/23/2020  Decreased Interest 0  Down, Depressed, Hopeless 1  PHQ - 2 Score 1  Altered sleeping 0  Tired, decreased energy 1  Change in appetite 0  Feeling bad or failure about yourself  1  Trouble concentrating 0  Moving slowly or fidgety/restless 0  Suicidal thoughts 0  PHQ-9 Score 3  Difficult doing work/chores Somewhat difficult  Some recent data might be hidden    ADLs:  In your present state of health, do you have any difficulty performing the following activities: 12/23/2020 05/23/2020  Hearing? N N  Vision? Y N  Comment She follows with ophth for advanced macular degeneration. Able to read and drive short distances. -  Difficulty concentrating or making decisions? N N  Walking or climbing stairs? N N  Comment managing with cane -  Dressing or bathing? N N  Doing errands, shopping? N N  Some recent data might be hidden     Cognitive Testing  Alert? Yes  Normal Appearance?Yes  Oriented to person? Yes  Place? Yes   Time? Yes  Recall of three objects?  Yes  Can perform simple calculations? Yes  Displays appropriate judgment?Yes  Can read the correct time from a watch face?Yes  EOL planning: Does Patient Have a Medical Advance Directive?: No Would patient like information on creating a medical advance directive?: Yes (MAU/Ambulatory/Procedural Areas - Information given)     Review of Systems  Constitutional:  Negative for malaise/fatigue and weight loss.  HENT:  Negative for hearing loss and tinnitus.   Eyes:  Negative for blurred vision and double vision.  Respiratory:   Negative for cough, shortness of breath and wheezing.   Cardiovascular:  Negative for chest pain, palpitations, orthopnea, claudication and leg swelling.  Gastrointestinal:  Positive for constipation. Negative for abdominal pain, blood in stool, diarrhea, heartburn, melena, nausea and vomiting.  Genitourinary: Negative.   Musculoskeletal:  Positive for back pain (ortho/neuro following) and joint pain (hips, improved after injections). Negative for myalgias.  Skin:  Negative for rash.  Neurological:  Negative for dizziness, tingling, sensory change, weakness and headaches.  Endo/Heme/Allergies:  Negative for polydipsia.  Psychiatric/Behavioral: Negative.  Negative for depression, memory loss and substance abuse. The patient is not nervous/anxious and does not have insomnia.   All other systems reviewed and are negative.   Objective:     Blood pressure (!) 98/48, pulse 70, temperature (!) 97.3 F (36.3 C), weight 115 lb (52.2 kg), SpO2 99 %.  General Appearance: Well nourished, alert, WD/WN, female elder  in no apparent distress. Eyes: PERRLA, EOMs, conjunctiva no swelling or erythema Sinuses: No frontal/maxillary tenderness ENT/Mouth: EACs patent / TMs  nl. Nares clear without erythema, swelling, mucoid exudates. Oral hygiene is good. No erythema, swelling, or exudate. Tongue normal, non-obstructing. Tonsils not swollen or erythematous. Hearing normal.  Neck: Supple, thyroid normal without palpable nodules. No bruits, nodes or JVD. Respiratory: Respiratory effort normal.  BS equal and clear bilateral without rales, rhonci, wheezing or stridor. Cardio: Heart sounds are normal with regular rate and rhythm and no murmurs, rubs or gallops. Peripheral pulses are normal and equal bilaterally without edema. No aortic or femoral bruits. Abdomen: Flat, soft  with nl bowel sounds. Nontender, no guarding, rebound, hernias, masses, or organomegaly.  Lymphatics: Non tender without lymphadenopathy.   Musculoskeletal: Full ROM all peripheral extremities, joint stability, 5/5 strength, and slow steady gait.  Skin: warm/dry intact Neuro: Cranial nerves intact. DTR's UE  Nl/Equal and DTR's LE's absent. Normal muscle tone, no cerebellar symptoms. Sensation decreased R foot to monofilament.  Pysch: Alert and oriented X 3, normal affect, Insight and Judgment are fair GU: defer, no concerns  Medicare Attestation I have personally reviewed: The patient's medical and social history Their use of alcohol, tobacco or illicit drugs Their current medications and supplements The patient's functional ability including ADLs,fall risks, home safety risks, cognitive, and hearing and visual impairment Diet and physical activities Evidence for depression or mood  disorders  The patient's weight, height, BMI, and visual acuity have been recorded in the chart.  I have made referrals, counseling, and provided education to the patient based on review of the above and I have provided the patient with a written personalized care plan for preventive services.     Izora Ribas, NP   12/23/2020

## 2020-12-29 ENCOUNTER — Telehealth: Payer: Self-pay

## 2020-12-29 NOTE — Telephone Encounter (Signed)
Requesting a referral to see Dr. Amil Amen at [facility] for her arthritis.

## 2020-12-30 NOTE — Telephone Encounter (Signed)
Patient states that she has an upcoming appointment with Ortho. Will see what they advise her to do, whether or not to see rheumatology. Has OV here on the 14th

## 2021-01-04 DIAGNOSIS — M25551 Pain in right hip: Secondary | ICD-10-CM | POA: Diagnosis not present

## 2021-01-05 DIAGNOSIS — M25551 Pain in right hip: Secondary | ICD-10-CM | POA: Diagnosis not present

## 2021-01-06 ENCOUNTER — Ambulatory Visit (INDEPENDENT_AMBULATORY_CARE_PROVIDER_SITE_OTHER): Payer: Medicare Other | Admitting: Adult Health

## 2021-01-06 ENCOUNTER — Encounter: Payer: Self-pay | Admitting: Adult Health

## 2021-01-06 ENCOUNTER — Other Ambulatory Visit: Payer: Self-pay

## 2021-01-06 VITALS — BP 108/66 | HR 72 | Temp 97.2°F | Wt 112.0 lb

## 2021-01-06 DIAGNOSIS — Z9114 Patient's other noncompliance with medication regimen: Secondary | ICD-10-CM | POA: Diagnosis not present

## 2021-01-06 DIAGNOSIS — I10 Essential (primary) hypertension: Secondary | ICD-10-CM

## 2021-01-06 DIAGNOSIS — E118 Type 2 diabetes mellitus with unspecified complications: Secondary | ICD-10-CM

## 2021-01-06 DIAGNOSIS — E1165 Type 2 diabetes mellitus with hyperglycemia: Secondary | ICD-10-CM

## 2021-01-06 DIAGNOSIS — Z794 Long term (current) use of insulin: Secondary | ICD-10-CM

## 2021-01-06 MED ORDER — BASAGLAR KWIKPEN 100 UNIT/ML ~~LOC~~ SOPN: PEN_INJECTOR | SUBCUTANEOUS | 0 refills | Status: DC

## 2021-01-06 NOTE — Patient Instructions (Addendum)
Continue trulicity 1.5 mg once weekly injection  STOP novolin 70/30  START - insulin glargine (basalgar) ONCE daily - take 20 units daily to start If in 1 week you have many fasting glucose 150+, increase to 25 units If still frequently above 150, increase to 30 units  Start taking miralax and senokot daily for bowels. Eat small meals regularly to avoid nausea. Avoid very fatty meals.   Take lactulose liquid IF NEEDED for severe constipation

## 2021-01-06 NOTE — Progress Notes (Signed)
Patient ID: [name], female   DOB: June 26, 1929, 87 y.o.   MRN: [id number]  2 WEEK FOLLOW UP  Assessment:    Essential hypertension Well controlled BP reported and in office today DASH diet, exercise and monitor at home.  Uncontrolled secondary diabetes mellitus with stage 3 CKD (GFR 30-59) (HCC)/ Long term current use of insulin (HCC)/ Insulin dose changed ([place]) Extended discussion over 30 min about diabetes pathophysiology and management.,  CONSISTENT/ONGOING POOR COMPLIANCE / POOR CONTROL/ HIGH RISK POOR OUTCOMES; Changing plan to avoid need for titration Doing fairly with trulicity - continue 1.5 mg/week STOP novolin 70/30 START - insulin glargine 20 units daily, given titration plan up to 30 units if needed with close follow up. Samples provided today.  Completed [employer] assistance paperwork while in office today Education provided with husband present,  Advised any glucose <100, call for further instructions CLOSE FOLLOW UP in 2 weeks or sooner LOW SUGAR IS MORE DANGEROUS THAN HIGH SUGAR EMPHASIZED, reviewed hypoglycemia management Request she bring glucose/insulin log with her to review.  She denies further needs at this time  Over 30 min of complex decision making, counseling and chart review were performed today.   Future Appointments  Date Time Provider [place]  01/14/2021  2:30 PM Liane Comber, NP [facility] None  02/25/2021  2:30 PM Rankin, Clent Demark, MD [facility] None  12/07/2021  2:00 PM Liane Comber, NP [facility] None  12/23/2021 11:00 AM Liane Comber, NP [facility] None     Subjective:   [name]  presents for 2 week follow up for poorly controlled T2DM on insulin with complications.    The patient has history of T2DM x 1996 and has been on insulin since 2000 and she has complications w/ CKD and Neuropathy.  Off of metformin due to CKD 3b; on bASA, statin Not on ACE/ARB per recommendation by nephrology Dr. Moshe Cipro Has  intermittently seen endocrinology though not consistently, recently has been managed by our office though with suspected poor compliance with recommended diet/med regimen/appointments She was on novolin 70/30 with irregular compliance She was started on trulicy by samples, up to taking 1.5 mg/week, does note some nausea/constipation but would like to continue- discussed strategies and bowel management - miralax + senokot Presents with glucose log - typically 100-150, rare elevations 300+ after eating ice cream She has had no symptoms of reactive hypoglycemia, diabetic polys or visual blurring.  She does report tingling and occasional painful paresthesias of the feet.  Lab Results  Component Value Date   HGBA1C 13.9 (H) 12/03/2020   She follows with [facility] Dr. Moshe Cipro, advised to stay off of ACE/ARB (caused deterioration) and NSAIDs.  Lab Results  Component Value Date   GFRNONAA 37 (L) 09/02/2020   GFRNONAA 35 (L) 05/21/2020   GFRNONAA 29 (L) 02/04/2020    Current Outpatient Medications on File Prior to Visit  Medication Sig   acetaminophen (TYLENOL) 500 MG tablet Take 500-1,000 mg by mouth 3 (three) times daily as needed.   AquaLance Lancets 30G MISC CHECK BLOOD SUGAR 3 TIMES A DAY   aspirin 81 MG tablet Take 81 mg by mouth daily.   benzonatate (TESSALON) 100 MG capsule Take 1 capsule (100 mg total) by mouth every 8 (eight) hours.   bisoprolol-hydrochlorothiazide (ZIAC) 2.5-6.25 MG tablet Take 1 tablet by mouth daily.   Blood Glucose Calibration (ACCU-CHEK AVIVA) SOLN Check blood sugar 3 times daily-DX-E11.22   Blood Glucose Monitoring Suppl ([place]) w/Device KIT Check blood  sugar 3 times daily-E11.8   buPROPion (WELLBUTRIN XL) 150 MG 24 hr tablet Take 1 tablet every Morning for Mood , Focus & Concentration   Cholecalciferol (VITAMIN D3) 2000 UNITS TABS Take 1 capsule by mouth 2 (two) times daily.   COMFORT EZ PEN NEEDLES 32G X 4 MM MISC USE 2 TIMES  DAILY AS DIRECTED , 1 PEN NEEDLE PER INJECTION.   Continuous Blood Gluc Sensor (FREESTYLE LIBRE SENSOR SYSTEM) MISC Check blood sugar 3 times a day. Dx:E11.8, Z79.4   Continuous Blood Gluc Sensor MISC Check blood sugar 3 times a day- Dx: E11.8/Z79.4   dexamethasone (DECADRON) 4 MG tablet Take 1 tab 3 x /day for 2 days,      then 2 x /day for 2  Days,     then 1 tab daily   dorzolamide-timolol (COSOPT) 22.3-6.8 MG/ML ophthalmic solution    Dulaglutide (TRULICITY) 1.5 LK/9.1PH SOPN Inject 1.5 mg into the skin once a week.   EASY COMFORT INSULIN SYRINGE 30G X 1/2" 0.5 ML MISC    gabapentin (NEURONTIN) 300 MG capsule Take 1 capsule 3 to 4 x / day for Painful Diabetic Neuropathy   hyoscyamine (LEVSIN SL) 0.125 MG SL tablet Take 1 to 2 tablets 3 to 4 x day if needed for Nausea, vomiting, cramping or diarrhea   lactulose (CHRONULAC) 10 GM/15ML solution Take 15 ml (3 tsp) once or twice daily as needed for constipation.   Lancet Devices (ADJUSTABLE LANCING DEVICE) MISC CHECK BLOOD SUGAR 3 TIMES A DAY   latanoprost (XALATAN) 0.005 % ophthalmic solution    LOTEMAX 0.5 % GEL    Magnesium 400 MG TABS Take 1 tablet by mouth daily.   Multiple Vitamins-Minerals (HAIR/SKIN/NAILS) TABS Take by mouth 2 (two) times daily.   ONETOUCH VERIO test strip USE 1 STRIP TO CHECK GLUCOSE THREE TIMES DAILY AS DIRECTED   rOPINIRole (REQUIP) 3 MG tablet Take  1 to 2 tablets  at Bedtime  for "Restless Legs Syndrome"   rosuvastatin (CRESTOR) 20 MG tablet TAKE 1 TABLET BY MOUTH ONCE DAILY FOR CHOLESTEROL   senna-docusate (SENOKOT-S) 8.6-50 MG tablet Take 1 tablet by mouth daily.   vitamin B-12 (CYANOCOBALAMIN) 1000 MCG tablet Take 1,000 mcg by mouth daily.   vitamin C (ASCORBIC ACID) 500 MG tablet Take 500 mg by mouth daily.   zinc gluconate 50 MG tablet Take 50 mg by mouth daily.   No current facility-administered medications on file prior to visit.   Patient Active Problem List   Diagnosis Date Noted   Frequent UTI  02/10/2020   Advanced nonexudative age-related macular degeneration of left eye with subfoveal involvement 01/08/2020   Memory changes 12/31/2019   Poorly controlled type 2 diabetes mellitus ([place]) 12/31/2019   Poor compliance with medication 12/31/2019   Advanced nonexudative age-related macular degeneration of right eye with subfoveal involvement 11/18/2019   Exudative age-related macular degeneration of left eye with inactive choroidal neovascularization ([place]) 11/18/2019   Exudative age-related macular degeneration of right eye with active choroidal neovascularization ([place]) 08/19/2019   Exudative age-related macular degeneration of left eye ([place]) 08/19/2019   Long term (current) use of insulin ([place]) 06/10/2019   Major depression in remission ([place]) 12/20/2018   Type 2 diabetes mellitus with complication, with long-term current use of insulin ([place]) 02/09/2017   Atherosclerosis of aorta ([place]) by AbdCTscan on 06/28/2017 11/06/2016   Encounter for Medicare annual wellness exam 05/18/2015   Medication management 08/06/2013   Neuropathy due to type 2 diabetes mellitus ([place]) 06/04/2013  CKD stage 3 due to type 2 diabetes mellitus ([place])    Hyperlipidemia associated with type 2 diabetes mellitus ([place])    Vitamin D deficiency    Hypertension 07/29/2012   Chronic pain 07/29/2012     Allergies Allergies  Allergen Reactions   Morphine And Related Other (See Comments)    fuzzy feeling    SURGICAL HISTORY She  has a past surgical history that includes Abdominal hysterectomy; Cesarean section; Back surgery (1993); Appendectomy; Colonoscopy; Eye surgery; Trigger finger release (01/10/2012); and Breast excisional biopsy (Right, 1967). FAMILY HISTORY Her family history includes Diabetes in her brother and sister; Kidney cancer in her brother; Liver cancer in her sister; Liver disease in her sister; Lymphoma in her sister; Stroke in her brother. SOCIAL HISTORY She  reports that she has never smoked.  She has never used smokeless tobacco. She reports that she does not drink alcohol and does not use drugs.  Review of Systems  Constitutional:  Negative for malaise/fatigue and weight loss.  HENT:  Negative for hearing loss and tinnitus.   Eyes:  Negative for blurred vision and double vision.  Respiratory:  Negative for cough, shortness of breath and wheezing.   Cardiovascular:  Negative for chest pain, palpitations, orthopnea, claudication and leg swelling.  Gastrointestinal:  Positive for constipation and nausea. Negative for abdominal pain, blood in stool, diarrhea, heartburn, melena and vomiting.  Genitourinary: Negative.   Musculoskeletal:  Positive for back pain (ortho/neuro following) and joint pain (hips, improved after injections). Negative for myalgias.  Skin:  Negative for rash.  Neurological:  Negative for dizziness, tingling, sensory change, weakness and headaches.  Endo/Heme/Allergies:  Negative for polydipsia.  Psychiatric/Behavioral: Negative.  Negative for depression, memory loss and substance abuse. The patient is not nervous/anxious and does not have insomnia.   All other systems reviewed and are negative.   Objective:     Blood pressure 108/66, pulse 72, temperature (!) 97.2 F (36.2 C), weight 112 lb (50.8 kg), SpO2 99 %.  General Appearance: Well nourished, alert, WD/WN, female elder  in no apparent distress. Eyes: PERRLA, EOMs, conjunctiva no swelling or erythema Sinuses: No frontal/maxillary tenderness ENT/Mouth: EACs patent / TMs  nl. Nares clear without erythema, swelling, mucoid exudates. Oral hygiene is good. No erythema, swelling, or exudate. Tongue normal, non-obstructing. Tonsils not swollen or erythematous. Mildly HOH.  Neck: Supple, thyroid normal without palpable nodules.  Respiratory: Respiratory effort normal.  BS equal and clear bilateral without rales, rhonci, wheezing or stridor. Cardio: Heart sounds are normal with regular rate and rhythm and no  murmurs, rubs or gallops. Peripheral pulses are normal and equal bilaterally without edema. Abdomen: Flat, soft  with nl bowel sounds. Nontender, no guarding, rebound, hernias, masses, or organomegaly.  Lymphatics: Non tender without lymphadenopathy.  Musculoskeletal: slow steady gait with cane Skin: warm/dry intact Neuro: Normal muscle tone,  Pysch: Alert and oriented X 3, normal affect, Insight and Judgment are fair   Izora Ribas, NP   01/06/2021

## 2021-01-11 ENCOUNTER — Other Ambulatory Visit: Payer: Self-pay | Admitting: Internal Medicine

## 2021-01-11 ENCOUNTER — Telehealth: Payer: Self-pay

## 2021-01-11 NOTE — Telephone Encounter (Signed)
[employer] paperwork filled out and submitted. Request was approved.

## 2021-01-14 ENCOUNTER — Ambulatory Visit: Payer: Medicare Other | Admitting: Adult Health

## 2021-01-21 ENCOUNTER — Encounter (INDEPENDENT_AMBULATORY_CARE_PROVIDER_SITE_OTHER): Payer: Medicare Other | Admitting: Ophthalmology

## 2021-01-21 ENCOUNTER — Telehealth: Payer: Self-pay

## 2021-01-21 NOTE — Progress Notes (Addendum)
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) (HCC)/ Long term current use of insulin (HCC)/ Insulin dose changed (HCC) CONSISTENT/ONGOING POOR COMPLIANCE / POOR CONTROL/ HIGH RISK POOR OUTCOMES;  Currently on trulicity - continue 1.5 mg/week and insulin glargine 20 units daily, Blood sugars have been in the 90's- did not bring log today. Education provided with husband present,  Advised any glucose <100, call for further instructions CLOSE FOLLOW UP in 2 weeks or sooner LOW SUGAR IS MORE DANGEROUS THAN HIGH SUGAR EMPHASIZED, reviewed hypoglycemia management  Fall  Pt had an episode yesterday falling without any known reason  Advised if this happens again she is to check her blood sugar.  Also discussed possibility of using a walker  Chronic pain of both hips/ bursitis of both hips -     Ambulatory referral to [place] to have persistent hip pain, declines physical therapy.Pt is having cortisone injections periodically to both hips but pain persists.  Is prescribed Tramadol from orthopedic doctor and asking if we can fill. Placed pain clinic referral for management of pain medications. Pt is currently considered in pain management at Dr. Vertell Limber, will have pt discuss pain not being adequately controlled with their office and if they wish to refer her to another clinic that will be their decision  Further disposition pending results of labs. Discussed med's effects and SE's.   Over 30 minutes of exam, counseling, chart review, and critical decision making was performed.   Future Appointments  Date Time Provider [place]  02/25/2021  2:30 PM Rankin, Clent Demark, MD [facility] None  04/08/2021 11:30 AM Liane Comber, NP [facility] None  12/07/2021  2:00 PM Liane Comber, NP [facility] None  12/23/2021 11:00 AM Liane Comber, NP [facility] None     ------------------------------------------------------------------------------------------------------------------   HPI BP 138/66   Pulse (!) 57   Temp (!) 97.3 F (36.3 C)   Wt 112 lb (50.8 kg)   SpO2 98%   BMI 19.68 kg/m  87 y.o.female presents for DM follow up  Pt is on trulicity 8.9FY qw and Basaglar 20 units daily.  Blood sugar this morning was 92. States it has been running in 90's but did not bring her blood sugar log today because she forgot due to ortho appointment this AM  BMI is Body mass index is 19.68 kg/m., she has not been working on diet and exercise. Wt Readings from Last 3 Encounters:  01/22/21 112 lb (50.8 kg)  01/06/21 112 lb (50.8 kg)  12/23/20 115 lb (52.2 kg)    Pt fell yesterday, did not trip or remember feeling weak or dizzy.  Has not been using her cane.  Pt had injection in both hips this morning at orthopedics. Is using Tramadol PRN and can take up to 2 tabs at a time for pain. Past Medical History:  Diagnosis Date   Arthritis    Chronic back pain    Cystoid macular edema of right eye 08/19/2019   Diabetic neuropathy (HCC)    Hypercholesteremia    Hypertension    Liver lesion    Neuropathy    Type II or unspecified type diabetes mellitus without mention of complication, not stated as uncontrolled    Vitamin D deficiency    Wears glasses      Allergies  Allergen Reactions   Morphine And Related Other (See Comments)    fuzzy feeling    Current Outpatient Medications on File Prior to Visit  Medication  Sig   acetaminophen (TYLENOL) 500 MG tablet Take 500-1,000 mg by mouth 3 (three) times daily as needed.   AquaLance Lancets 30G MISC CHECK BLOOD SUGAR 3 TIMES A DAY   aspirin 81 MG tablet Take 81 mg by mouth daily.   benzonatate (TESSALON) 100 MG capsule Take 1 capsule (100 mg total) by mouth every 8 (eight) hours.   bisoprolol-hydrochlorothiazide (ZIAC) 2.5-6.25 MG tablet Take 1 tablet by mouth daily.   Blood Glucose Calibration  (ACCU-CHEK AVIVA) SOLN Check blood sugar 3 times daily-DX-E11.22   Blood Glucose Monitoring Suppl (ONETOUCH VERIO FLEX SYSTEM) w/Device KIT Check blood sugar 3 times daily-E11.8   buPROPion (WELLBUTRIN XL) 150 MG 24 hr tablet Take 1 tablet every Morning for Mood , Focus & Concentration   Cholecalciferol (VITAMIN D3) 2000 UNITS TABS Take 1 capsule by mouth 2 (two) times daily.   COMFORT EZ PEN NEEDLES 32G X 4 MM MISC USE 2 TIMES DAILY AS DIRECTED , 1 PEN NEEDLE PER INJECTION.   Continuous Blood Gluc Sensor (FREESTYLE LIBRE SENSOR SYSTEM) MISC Check blood sugar 3 times a day. Dx:E11.8, Z79.4   Continuous Blood Gluc Sensor MISC Check blood sugar 3 times a day- Dx: E11.8/Z79.4   dorzolamide-timolol (COSOPT) 22.3-6.8 MG/ML ophthalmic solution    Dulaglutide (TRULICITY) 1.5 GY/1.8HU SOPN Inject 1.5 mg into the skin once a week.   EASY COMFORT INSULIN SYRINGE 30G X 1/2" 0.5 ML MISC    gabapentin (NEURONTIN) 300 MG capsule Take 1 capsule 3 to 4 x / day for Painful Diabetic Neuropathy   hyoscyamine (LEVSIN SL) 0.125 MG SL tablet Take 1 to 2 tablets 3 to 4 x day if needed for Nausea, vomiting, cramping or diarrhea   Insulin Glargine (BASAGLAR KWIKPEN) 100 UNIT/ML Inject 20-30 units once daily as directed for diabetes.   lactulose (CHRONULAC) 10 GM/15ML solution Take 15 ml (3 tsp) once or twice daily as needed for constipation.   Lancet Devices (ADJUSTABLE LANCING DEVICE) MISC CHECK BLOOD SUGAR 3 TIMES A DAY   latanoprost (XALATAN) 0.005 % ophthalmic solution    LOTEMAX 0.5 % GEL    Magnesium 400 MG TABS Take 1 tablet by mouth daily.   Multiple Vitamins-Minerals (HAIR/SKIN/NAILS) TABS Take by mouth 2 (two) times daily.   ONETOUCH VERIO test strip USE 1 STRIP TO CHECK GLUCOSE THREE TIMES DAILY AS DIRECTED   rOPINIRole (REQUIP) 3 MG tablet Take  1 to 2 tablets  at Bedtime  for "Restless Legs Syndrome"   rosuvastatin (CRESTOR) 20 MG tablet TAKE 1 TABLET BY MOUTH ONCE DAILY FOR CHOLESTEROL   senna-docusate  (SENOKOT-S) 8.6-50 MG tablet Take 1 tablet by mouth daily.   vitamin B-12 (CYANOCOBALAMIN) 1000 MCG tablet Take 1,000 mcg by mouth daily.   vitamin C (ASCORBIC ACID) 500 MG tablet Take 500 mg by mouth daily.   zinc gluconate 50 MG tablet Take 50 mg by mouth daily.   No current facility-administered medications on file prior to visit.    ROS: all negative except above.   Physical Exam:  BP 138/66   Pulse (!) 57   Temp (!) 97.3 F (36.3 C)   Wt 112 lb (50.8 kg)   SpO2 98%   BMI 19.68 kg/m   General Appearance: Pleasant frail female in no apparent distress. Eyes: PERRLA, EOMs, conjunctiva no swelling or erythema Sinuses: No Frontal/maxillary tenderness ENT/Mouth: Ext aud canals clear, TMs without erythema, bulging. No erythema, swelling, or exudate on post pharynx.  Tonsils not swollen or erythematous. Hearing normal.  Neck: Supple,  thyroid normal.  Respiratory: Respiratory effort normal, BS equal bilaterally without rales, rhonchi, wheezing or stridor.  Cardio: RRR with no MRGs. Brisk peripheral pulses without edema.  Abdomen: Soft, + BS.  Non tender, no guarding, rebound, hernias, masses. Lymphatics: Non tender without lymphadenopathy.  Musculoskeletal: Full ROM, 5/5 strength, slow steady gait Skin: Warm, dry without rashes, lesions, ecchymosis.  Neuro: Cranial nerves intact. Normal muscle tone, no cerebellar symptoms. Sensation intact.  Psych: Awake and oriented X 3, normal affect, Insight and Judgment appropriate.     Magda Bernheim, NP 11:37 AM [name]

## 2021-01-21 NOTE — Telephone Encounter (Signed)
Requesting a referral to Rheumatology. Saw Orthopedics but nothing was done. In a lot of pain in her hips down into her legs. Please advise.

## 2021-01-22 ENCOUNTER — Ambulatory Visit: Payer: Medicare Other | Admitting: Adult Health

## 2021-01-22 ENCOUNTER — Other Ambulatory Visit: Payer: Self-pay

## 2021-01-22 ENCOUNTER — Encounter: Payer: Self-pay | Admitting: Nurse Practitioner

## 2021-01-22 ENCOUNTER — Ambulatory Visit (INDEPENDENT_AMBULATORY_CARE_PROVIDER_SITE_OTHER): Payer: Medicare Other | Admitting: Nurse Practitioner

## 2021-01-22 VITALS — BP 138/66 | HR 57 | Temp 97.3°F | Wt 112.0 lb

## 2021-01-22 DIAGNOSIS — N183 Chronic kidney disease, stage 3 unspecified: Secondary | ICD-10-CM

## 2021-01-22 DIAGNOSIS — G8929 Other chronic pain: Secondary | ICD-10-CM

## 2021-01-22 DIAGNOSIS — Z794 Long term (current) use of insulin: Secondary | ICD-10-CM | POA: Diagnosis not present

## 2021-01-22 DIAGNOSIS — M25552 Pain in left hip: Secondary | ICD-10-CM

## 2021-01-22 DIAGNOSIS — M7071 Other bursitis of hip, right hip: Secondary | ICD-10-CM

## 2021-01-22 DIAGNOSIS — Z9114 Patient's other noncompliance with medication regimen: Secondary | ICD-10-CM | POA: Diagnosis not present

## 2021-01-22 DIAGNOSIS — E118 Type 2 diabetes mellitus with unspecified complications: Secondary | ICD-10-CM

## 2021-01-22 DIAGNOSIS — M25551 Pain in right hip: Secondary | ICD-10-CM

## 2021-01-22 DIAGNOSIS — M7072 Other bursitis of hip, left hip: Secondary | ICD-10-CM

## 2021-01-22 DIAGNOSIS — W19XXXA Unspecified fall, initial encounter: Secondary | ICD-10-CM

## 2021-01-22 DIAGNOSIS — E1122 Type 2 diabetes mellitus with diabetic chronic kidney disease: Secondary | ICD-10-CM | POA: Diagnosis not present

## 2021-01-22 DIAGNOSIS — Z91148 Patient's other noncompliance with medication regimen for other reason: Secondary | ICD-10-CM

## 2021-01-22 NOTE — Patient Instructions (Addendum)
Please check blood sugar if you fall again without any reason   Insulin Glargine Injection What is this medication? INSULIN GLARGINE (IN su lin GLAR geen) treats diabetes. It works by increasing insulin levels in your body, which decreases your blood sugar (glucose). It belongs to a group of medications called long-acting insulins or basal insulins. Changes to diet and exercise are often combined with this medication. This medicine may be used for other purposes; ask your health care provider or pharmacist if you have questions. COMMON BRAND NAME(S): BASAGLAR, Lantus, Lantus SoloStar, Semglee, Toujeo Max SoloStar, [employer] What should I tell my care team before I take this medication? They need to know if you have any of these conditions: Episodes of low blood sugar Eye disease, vision problems Kidney disease Liver disease An unusual or allergic reaction to insulin, metacresol, other medications, foods, dyes, or preservatives Pregnant or trying to get pregnant Breast-feeding How should I use this medication? This medication is for injection under the skin. Use this medication at the same time each day. Use exactly as directed. This insulin should never be mixed in the same syringe with other insulins before injection. Do not vigorously shake before use. You will be taught how to use this medication and how to adjust doses for activities and illness. Do not use more insulin than prescribed. Always check the appearance of your insulin before using it. This medication should be clear and colorless like water. Do not use it if it is cloudy, thickened, colored, or has solid particles in it. If you use an insulin pen, be sure to take off the outer needle cover before using the dose. It is important that you put your used needles and syringes in a special sharps container. Do not put them in a trash can. If you do not have a sharps container, call your pharmacist or care team to get one. This  medication comes with INSTRUCTIONS FOR USE. Ask your pharmacist for directions on how to use this medication. Read the information carefully. Talk to your pharmacist or care team if you have questions. Talk to your care team regarding the use of this medication in children. While this medication may be prescribed for children as young as 6 years for selected conditions, precautions do apply. Overdosage: If you think you have taken too much of this medicine contact a poison control center or emergency room at once. NOTE: This medicine is only for you. Do not share this medicine with others. What if I miss a dose? It is important not to miss a dose. Your care team should discuss a plan for missed doses with you. If you do miss a dose, follow their plan. Do not take double doses. What may interact with this medication? Alcohol containing beverages Antiviral medications for HIV or AIDS Aspirin and aspirin-like medications Beta-blockers like atenolol, metoprolol, propranolol Certain medications for blood pressure, heart disease, irregular heart beat Chromium Clonidine Diuretics Female hormones, such as estrogens or progestins, birth control pills Fenofibrate Gemfibrozil Guanethidine Isoniazid Lanreotide Female hormones or anabolic steroids MAOIs like Carbex, Eldepryl, Marplan, Nardil, and Parnate Medications for weight loss Medications for allergies, asthma, cold, or cough Medications for depression, anxiety, or psychotic disturbances Niacin Nicotine NSAIDs, medications for pain and inflammation, like ibuprofen or naproxen Octreotide Other medications for diabetes, like glyburide, glipizide, or glimepiride Pasireotide Pentamidine Phenytoin Probenecid Quinolone antibiotics such as ciprofloxacin, levofloxacin, ofloxacin Reserpine Some herbal dietary supplements Steroid medications such as prednisone or cortisone Sulfamethoxazole; trimethoprim Thyroid hormones This  list may not  describe all possible interactions. Give your health care provider a list of all the medicines, herbs, non-prescription drugs, or dietary supplements you use. Also tell them if you smoke, drink alcohol, or use illegal drugs. Some items may interact with your medicine. What should I watch for while using this medication? Visit your care team for regular checks on your progress. Do not drive, use machinery, or do anything that needs mental alertness until you know how this medication affects you. Alcohol may interfere with the effect of this medication. Avoid alcoholic drinks. A test called the HbA1C (A1C) will be monitored. This is a simple blood test. It measures your blood sugar control over the last 2 to 3 months. You will receive this test every 3 to 6 months. Learn how to check your blood sugar. Learn the symptoms of low and high blood sugar and how to manage them. Always carry a quick-source of sugar with you in case you have symptoms of low blood sugar. Examples include hard sugar candy or glucose tablets. Make sure others know that you can choke if you eat or drink when you develop serious symptoms of low blood sugar, such as seizures or unconsciousness. They must get medical help at once. Tell your care team if you have high blood sugar. You might need to change the dose of your medication. If you are sick or exercising more than usual, you might need to change the dose of your medication. Do not skip meals. Ask your care team if you should avoid alcohol. Many nonprescription cough and cold products contain sugar or alcohol. These can affect blood sugar. Make sure that you have the right kind of syringe for the type of insulin you use. Try not to change the brand and type of insulin or syringe unless your care team tells you to. Switching insulin brand or type can cause dangerously high or low blood sugar. Always keep an extra supply of insulin, syringes, and needles on hand. Use a syringe one time  only. Throw away syringe and needle in a closed container to prevent accidental needle sticks. Insulin pens and cartridges should never be shared. Even if the needle is changed, sharing may result in passing of viruses like hepatitis or HIV. Each time you get a new box of pen needles, check to see if they are the same type as the ones you were trained to use. If not, ask your care team to show you how to use this new type properly. Wear a medical ID bracelet or chain, and carry a card that describes your disease and details of your medication and dosage times. What side effects may I notice from receiving this medication? Side effects that you should report to your care team as soon as possible: Allergic reactions-skin rash, itching, hives, swelling of the face, lips, tongue, or throat Low blood sugar (hypoglycemia)-tremors or shaking, anxiety, sweating, cold or clammy skin, confusion, dizziness, rapid heartbeat Low potassium level-muscle pain or cramps, unusual weakness or fatigue, fast or irregular heartbeat, constipation Side effects that usually do not require medical attention (report to your care team if they continue or are bothersome): Lipodystrophy-hardening or scarring of tissue at injection site Pain, redness, or irritation at injection site Weight gain This list may not describe all possible side effects. Call your doctor for medical advice about side effects. You may report side effects to FDA at 1-800-FDA-1088. Where should I keep my medication? Keep out of the reach of children  and pets. Unopened Vials: Lantus vials: Store in a refrigerator between 2 and 8 degrees C (36 and 46 degrees F) or at room temperature below 30 degrees C (86 degrees F). Do not freeze or use if the insulin has been frozen. Protect from light and excessive heat. If stored at room temperature, the vial must be discarded after 28 days. Throw away any unopened and unused medication that has been stored in the  refrigerator after the expiration date. Unopened Pens: Neurosurgeon: Store in a refrigerator between 2 and 8 degrees C (36 and 46 degrees F) or at room temperature below 30 degrees C (86 degrees F). Do not freeze or use if the insulin has been frozen. Protect from light and excessive heat. If stored at room temperature, the pen must be discarded after 28 days. Throw away any unopened and unused medication that has been stored in the refrigerator after the expiration date. Lantus Solostar Pens: Store in a refrigerator between 2 and 8 degrees C (36 and 46 degrees F) or at room temperature below 30 degrees C (86 degrees F). Do not freeze or use if the insulin has been frozen. Protect from light and excessive heat. If stored at room temperature, the pen must be discarded after 28 days. Throw away any unopened and unused medication that has been stored in the refrigerator after the expiration date. Semglee Pens: Store in a refrigerator between 2 and 8 degrees C (36 and 46 degrees F) or at room temperature below 30 degrees C (86 degrees F). Do not freeze or use if the insulin has been frozen. Protect from light and excessive heat. If stored at room temperature, the pen must be discarded after 28 days. Throw away any unopened and unused medication that has been stored in the refrigerator after the expiration date. Toujeo Solostar Pens or Toujeo Max [employer] Pens: Store in a refrigerator between 2 and 8 degrees C (36 and 46 degrees F). Do not freeze or use if the insulin has been frozen. Protect from light and excessive heat. Throw away any unopened and unused medication that has been stored in the refrigerator after the expiration date. Vials that you are using: Lantus vials: Store in a refrigerator or at room temperature below 30 degrees C (86 degrees F). Do not freeze. Keep away from heat and light. Throw the opened vial away after 28 days. Semglee vials: Store in a refrigerator or at room temperature below  30 degrees C (86 degrees F). Do not freeze. Keep away from heat and light. Throw the opened vial away after 28 days. Pens that you are using: Basaglar KwikPens: Store at room temperature below 30 degrees C (86 degrees F). Do not refrigerate or freeze. Keep away from heat and light. Throw the pen away after 28 days, even if it still has insulin left in it. Lantus Solostar Pens: Store at room temperature below 30 degrees C (86 degrees F). Do not refrigerate or freeze. Keep away from heat and light. Throw the pen away after 28 days, even if it still has insulin left in it. Semglee Pens: Store at room temperature below 30 degrees C (86 degrees F). Do not refrigerate or freeze. Keep away from heat and light. Throw the pen away after 28 days, even if it still has insulin left in it. Toujeo Solostar Pens or Toujeo Max [employer] Pens: Store at room temperature below 30 degrees C (86 degrees F). Do not refrigerate or freeze. Keep away from heat and light.  Throw the pen away after 56 days, even if it still has insulin left in it. NOTE: This sheet is a summary. It may not cover all possible information. If you have questions about this medicine, talk to your doctor, pharmacist, or health care provider.  2022 [employer]vier/Gold Standard (2020-05-01 11:48:35)

## 2021-01-25 ENCOUNTER — Encounter (INDEPENDENT_AMBULATORY_CARE_PROVIDER_SITE_OTHER): Payer: Medicare Other | Admitting: Ophthalmology

## 2021-01-29 ENCOUNTER — Other Ambulatory Visit: Payer: Self-pay

## 2021-01-29 ENCOUNTER — Ambulatory Visit (INDEPENDENT_AMBULATORY_CARE_PROVIDER_SITE_OTHER): Payer: Medicare Other

## 2021-01-29 VITALS — Temp 97.8°F

## 2021-01-29 DIAGNOSIS — Z23 Encounter for immunization: Secondary | ICD-10-CM

## 2021-02-04 ENCOUNTER — Other Ambulatory Visit: Payer: Self-pay | Admitting: Adult Health

## 2021-02-04 ENCOUNTER — Other Ambulatory Visit: Payer: Self-pay

## 2021-02-04 DIAGNOSIS — Z794 Long term (current) use of insulin: Secondary | ICD-10-CM

## 2021-02-04 DIAGNOSIS — E118 Type 2 diabetes mellitus with unspecified complications: Secondary | ICD-10-CM

## 2021-02-05 MED ORDER — BASAGLAR KWIKPEN 100 UNIT/ML ~~LOC~~ SOPN: PEN_INJECTOR | SUBCUTANEOUS | 0 refills | Status: DC

## 2021-02-11 ENCOUNTER — Other Ambulatory Visit: Payer: Self-pay

## 2021-02-11 DIAGNOSIS — Z794 Long term (current) use of insulin: Secondary | ICD-10-CM

## 2021-02-11 DIAGNOSIS — E118 Type 2 diabetes mellitus with unspecified complications: Secondary | ICD-10-CM

## 2021-02-11 MED ORDER — BASAGLAR KWIKPEN 100 UNIT/ML ~~LOC~~ SOPN: PEN_INJECTOR | SUBCUTANEOUS | 0 refills | Status: DC

## 2021-02-19 NOTE — Progress Notes (Signed)
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) (HCC)/ Long term current use of insulin (HCC)/ Insulin dose changed (HCC) CONSISTENT/ONGOING POOR COMPLIANCE / POOR CONTROL/ HIGH RISK POOR OUTCOMES;  Currently on trulicity - continue 1.5 mg/week Reports glucose 70s-110- but did have 1 episodes of hypoglycemia 2 weeks ago Reduce insulin glargine to 18 units daily, if any glucose <70 reduce further to 16 units Education provided with husband present,  Advised any glucose <70, call for further instructions CLOSE FOLLOW UP in 4 weeks or sooner LOW SUGAR IS MORE DANGEROUS THAN HIGH SUGAR EMPHASIZED, reviewed hypoglycemia management -     COMPLETE METABOLIC PANEL WITH GFR -     Fructosamine -     Insulin Glargine (BASAGLAR KWIKPEN) 100 UNIT/ML; Inject 16-18 units once daily as directed for diabetes.  Hypotension, unspecified hypotension type STOP HCTZ, encouraged regular hydration Monitor BP at home and keep log to bring Call any <110/70 or persistently 150/90+    Further disposition pending results of labs. Discussed med's effects and SE's.   Over 20 minutes of exam, counseling, chart review, and critical decision making was performed.   Future Appointments  Date Time Provider [place]  02/25/2021  8:45 AM Rankin, Clent Demark, MD [facility] None  03/30/2021 12:30 PM [facility] MM 2 [facility]  04/08/2021 11:30 AM Liane Comber, NP [facility] None  12/07/2021  2:00 PM Liane Comber, NP [facility] None  12/23/2021 11:00 AM Liane Comber, NP [facility] None    ------------------------------------------------------------------------------------------------------------------   HPI BP (!) 110/50   Pulse 62   Temp (!) 97.2 F (36.2 C)   Wt 114 lb (51.7 kg)   SpO2 99%   BMI 20.03 kg/m  87 y.o.female presents for 1 month DM follow up following recent treatment change.   Pt has long hisotry of pooly controlled diabetes on novolin  70/30, recently transitioned to trulicity 0.2VO qw and Basaglar 20 units daily to simplify regimen.  Blood sugar this morning was 110. States it has been running in 70-110s's but did not bring her blood sugar log today, alleges a glucose of "36" about 2 weeks back, admits was feeling unwell and not eating. Today she reports has been feeling vaguely unwell for several weeks. "No get up and go."  She does check Bps at home but no long, poor historian, today their BP is BP: (!) 110/50 She denies chest pain, shortness of breath. Does report positional dizziness. Did have fall a few weeks back.   Lab Results  Component Value Date   HGBA1C 13.9 (H) 12/03/2020    BMI is Body mass index is 20.03 kg/m., she has not been working on diet and exercise. Wt Readings from Last 3 Encounters:  02/23/21 114 lb (51.7 kg)  01/22/21 112 lb (50.8 kg)  01/06/21 112 lb (50.8 kg)     Past Medical History:  Diagnosis Date   Arthritis    Chronic back pain    Cystoid macular edema of right eye 08/19/2019   Diabetic neuropathy (HCC)    Hypercholesteremia    Hypertension    Liver lesion    Neuropathy    Type II or unspecified type diabetes mellitus without mention of complication, not stated as uncontrolled    Vitamin D deficiency    Wears glasses      Allergies  Allergen Reactions   Morphine And Related Other (See Comments)    fuzzy feeling    Current Outpatient Medications on File Prior to  Visit  Medication Sig   acetaminophen (TYLENOL) 500 MG tablet Take 500-1,000 mg by mouth 3 (three) times daily as needed.   AquaLance Lancets 30G MISC CHECK BLOOD SUGAR 3 TIMES A DAY   aspirin 81 MG tablet Take 81 mg by mouth daily.   benzonatate (TESSALON) 100 MG capsule Take 1 capsule (100 mg total) by mouth every 8 (eight) hours.   Blood Glucose Calibration (ACCU-CHEK AVIVA) SOLN Check blood sugar 3 times daily-DX-E11.22   Blood Glucose Monitoring Suppl (ONETOUCH VERIO FLEX SYSTEM) w/Device KIT Check blood  sugar 3 times daily-E11.8   buPROPion (WELLBUTRIN XL) 150 MG 24 hr tablet Take 1 tablet every Morning for Mood , Focus & Concentration   Cholecalciferol (VITAMIN D3) 2000 UNITS TABS Take 1 capsule by mouth 2 (two) times daily.   COMFORT EZ PEN NEEDLES 32G X 4 MM MISC USE 2 TIMES DAILY AS DIRECTED , 1 PEN NEEDLE PER INJECTION.   Continuous Blood Gluc Sensor (FREESTYLE LIBRE SENSOR SYSTEM) MISC Check blood sugar 3 times a day. Dx:E11.8, Z79.4   Continuous Blood Gluc Sensor MISC Check blood sugar 3 times a day- Dx: E11.8/Z79.4   dorzolamide-timolol (COSOPT) 22.3-6.8 MG/ML ophthalmic solution    EASY COMFORT INSULIN SYRINGE 30G X 1/2" 0.5 ML MISC    gabapentin (NEURONTIN) 300 MG capsule Take 1 capsule 3 to 4 x / day for Painful Diabetic Neuropathy   hyoscyamine (LEVSIN SL) 0.125 MG SL tablet Take 1 to 2 tablets 3 to 4 x day if needed for Nausea, vomiting, cramping or diarrhea   lactulose (CHRONULAC) 10 GM/15ML solution Take 15 ml (3 tsp) once or twice daily as needed for constipation.   Lancet Devices (ADJUSTABLE LANCING DEVICE) MISC CHECK BLOOD SUGAR 3 TIMES A DAY   latanoprost (XALATAN) 0.005 % ophthalmic solution    LOTEMAX 0.5 % GEL    Magnesium 400 MG TABS Take 1 tablet by mouth daily.   Multiple Vitamins-Minerals (HAIR/SKIN/NAILS) TABS Take by mouth 2 (two) times daily.   ONETOUCH VERIO test strip USE 1 STRIP TO CHECK GLUCOSE THREE TIMES DAILY AS DIRECTED   rOPINIRole (REQUIP) 3 MG tablet Take  1 to 2 tablets  at Bedtime  for "Restless Legs Syndrome"   rosuvastatin (CRESTOR) 20 MG tablet TAKE 1 TABLET BY MOUTH ONCE DAILY FOR CHOLESTEROL   senna-docusate (SENOKOT-S) 8.6-50 MG tablet Take 1 tablet by mouth daily.   TRULICITY 1.5 KD/9.8PJ SOPN INJECT 0.5ML (1.5MG) SUBCUTANEOUSLY ONCE WEEKLY   vitamin B-12 (CYANOCOBALAMIN) 1000 MCG tablet Take 1,000 mcg by mouth daily.   vitamin C (ASCORBIC ACID) 500 MG tablet Take 500 mg by mouth daily.   zinc gluconate 50 MG tablet Take 50 mg by mouth daily.    No current facility-administered medications on file prior to visit.    ROS: all negative except above.   Physical Exam:  BP (!) 110/50   Pulse 62   Temp (!) 97.2 F (36.2 C)   Wt 114 lb (51.7 kg)   SpO2 99%   BMI 20.03 kg/m   General Appearance: Pleasant frail female in no apparent distress. Eyes: PERRLA, EOMs, conjunctiva no swelling or erythema Sinuses: No Frontal/maxillary tenderness ENT/Mouth: Ext aud canals clear, TMs without erythema, bulging. No erythema, swelling, or exudate on post pharynx.  Tonsils not swollen or erythematous. Hearing normal.  Neck: Supple, thyroid normal.  Respiratory: Respiratory effort normal, BS equal bilaterally without rales, rhonchi, wheezing or stridor.  Cardio: RRR with no MRGs. Brisk peripheral pulses without edema.  Abdomen: Soft, +  BS.  Non tender, no guarding, rebound, hernias, masses. Lymphatics: Non tender without lymphadenopathy.  Musculoskeletal: Full ROM, 5/5 strength, slow steady gait  Skin: Warm, dry without rashes, lesions, ecchymosis.  Neuro: Cranial nerves intact. Normal muscle tone, no cerebellar symptoms. Sensation intact.  Psych: Awake and oriented X 3, normal affect, Insight and Judgment appropriate.     Izora Ribas, NP 3:25 PM [facility]

## 2021-02-22 ENCOUNTER — Other Ambulatory Visit: Payer: Self-pay | Admitting: Internal Medicine

## 2021-02-22 ENCOUNTER — Ambulatory Visit: Payer: Medicare Other

## 2021-02-22 DIAGNOSIS — Z1231 Encounter for screening mammogram for malignant neoplasm of breast: Secondary | ICD-10-CM

## 2021-02-22 DIAGNOSIS — M5416 Radiculopathy, lumbar region: Secondary | ICD-10-CM | POA: Diagnosis not present

## 2021-02-23 ENCOUNTER — Ambulatory Visit (INDEPENDENT_AMBULATORY_CARE_PROVIDER_SITE_OTHER): Payer: Medicare Other | Admitting: Adult Health

## 2021-02-23 ENCOUNTER — Other Ambulatory Visit: Payer: Self-pay

## 2021-02-23 ENCOUNTER — Encounter: Payer: Self-pay | Admitting: Adult Health

## 2021-02-23 VITALS — BP 110/50 | HR 62 | Temp 97.2°F | Wt 114.0 lb

## 2021-02-23 DIAGNOSIS — E1165 Type 2 diabetes mellitus with hyperglycemia: Secondary | ICD-10-CM | POA: Diagnosis not present

## 2021-02-23 DIAGNOSIS — Z794 Long term (current) use of insulin: Secondary | ICD-10-CM

## 2021-02-23 DIAGNOSIS — E118 Type 2 diabetes mellitus with unspecified complications: Secondary | ICD-10-CM

## 2021-02-23 DIAGNOSIS — I959 Hypotension, unspecified: Secondary | ICD-10-CM

## 2021-02-23 DIAGNOSIS — R413 Other amnesia: Secondary | ICD-10-CM

## 2021-02-23 MED ORDER — BASAGLAR KWIKPEN 100 UNIT/ML ~~LOC~~ SOPN: PEN_INJECTOR | SUBCUTANEOUS | 0 refills | Status: DC

## 2021-02-23 NOTE — Patient Instructions (Signed)
   Please STOP bisoprolol/hctz -  Check blood pressure daily and keep a log to bring next visit  Please call if any blood pressure <110/70 OR persistent blood pressures above 150/90  Please reduce insulin glargine (daily injection) to 18 units If you have any blood sugar readings <70 please reduce further to 16 units  Please try to eat regular small meals/snacks

## 2021-02-25 ENCOUNTER — Ambulatory Visit (INDEPENDENT_AMBULATORY_CARE_PROVIDER_SITE_OTHER): Payer: Medicare Other | Admitting: Ophthalmology

## 2021-02-25 ENCOUNTER — Encounter (INDEPENDENT_AMBULATORY_CARE_PROVIDER_SITE_OTHER): Payer: Medicare Other | Admitting: Ophthalmology

## 2021-02-25 ENCOUNTER — Encounter (INDEPENDENT_AMBULATORY_CARE_PROVIDER_SITE_OTHER): Payer: Self-pay | Admitting: Ophthalmology

## 2021-02-25 ENCOUNTER — Other Ambulatory Visit: Payer: Self-pay

## 2021-02-25 DIAGNOSIS — H353211 Exudative age-related macular degeneration, right eye, with active choroidal neovascularization: Secondary | ICD-10-CM

## 2021-02-25 DIAGNOSIS — H353212 Exudative age-related macular degeneration, right eye, with inactive choroidal neovascularization: Secondary | ICD-10-CM

## 2021-02-25 DIAGNOSIS — H353221 Exudative age-related macular degeneration, left eye, with active choroidal neovascularization: Secondary | ICD-10-CM | POA: Diagnosis not present

## 2021-02-25 DIAGNOSIS — H353222 Exudative age-related macular degeneration, left eye, with inactive choroidal neovascularization: Secondary | ICD-10-CM | POA: Diagnosis not present

## 2021-02-25 MED ORDER — BEVACIZUMAB 2.5 MG/0.1ML IZ SOSY
2.5000 mg | PREFILLED_SYRINGE | INTRAVITREAL | Status: AC | PRN
  Administered 2021-02-25: 2.5 mg via INTRAVITREAL

## 2021-02-25 NOTE — Assessment & Plan Note (Signed)
Today at 10-week interval with maintained anatomy and much less active CNVM

## 2021-02-25 NOTE — Assessment & Plan Note (Signed)
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

## 2021-02-25 NOTE — Progress Notes (Signed)
02/25/2021     CHIEF COMPLAINT Patient presents for  Chief Complaint  Patient presents with   Retina Follow Up      HISTORY OF PRESENT ILLNESS: [name] is a 87 y.o. female who presents to the clinic today for:   HPI     Retina Follow Up   Patient presents with  Wet AMD.  In both eyes.  This started 10 weeks ago.  Duration of 10 weeks.  Since onset it is gradually worsening ([facility]).        Comments   EyeMeds: Cosopt BID OU Latanoprost QHS OU      Last edited by Reather Littler, COA on 02/25/2021  8:47 AM.      Referring physician: [name], MD [address] [place],  [place] [address]  HISTORICAL INFORMATION:   Selected notes from the MEDICAL RECORD NUMBER    Lab Results  Component Value Date   HGBA1C 13.9 (H) 12/03/2020     CURRENT MEDICATIONS: Current Outpatient Medications (Ophthalmic Drugs)  Medication Sig   dorzolamide-timolol (COSOPT) 22.3-6.8 MG/ML ophthalmic solution    latanoprost (XALATAN) 0.005 % ophthalmic solution    LOTEMAX 0.5 % GEL    No current facility-administered medications for this visit. (Ophthalmic Drugs)   Current Outpatient Medications (Other)  Medication Sig   acetaminophen (TYLENOL) 500 MG tablet Take 500-1,000 mg by mouth 3 (three) times daily as needed.   AquaLance Lancets 30G MISC CHECK BLOOD SUGAR 3 TIMES A DAY   aspirin 81 MG tablet Take 81 mg by mouth daily.   benzonatate (TESSALON) 100 MG capsule Take 1 capsule (100 mg total) by mouth every 8 (eight) hours.   Blood Glucose Calibration (ACCU-CHEK AVIVA) SOLN Check blood sugar 3 times daily-DX-E11.22   Blood Glucose Monitoring Suppl (ONETOUCH VERIO FLEX SYSTEM) w/Device KIT Check blood sugar 3 times daily-E11.8   buPROPion (WELLBUTRIN XL) 150 MG 24 hr tablet Take 1 tablet every Morning for Mood , Focus & Concentration   Cholecalciferol (VITAMIN D3) 2000 UNITS TABS Take 1 capsule by mouth 2 (two) times daily.   COMFORT EZ PEN NEEDLES  32G X 4 MM MISC USE 2 TIMES DAILY AS DIRECTED , 1 PEN NEEDLE PER INJECTION.   Continuous Blood Gluc Sensor (FREESTYLE LIBRE SENSOR SYSTEM) MISC Check blood sugar 3 times a day. Dx:E11.8, Z79.4   Continuous Blood Gluc Sensor MISC Check blood sugar 3 times a day- Dx: E11.8/Z79.4   EASY COMFORT INSULIN SYRINGE 30G X 1/2" 0.5 ML MISC    gabapentin (NEURONTIN) 300 MG capsule Take 1 capsule 3 to 4 x / day for Painful Diabetic Neuropathy   hyoscyamine (LEVSIN SL) 0.125 MG SL tablet Take 1 to 2 tablets 3 to 4 x day if needed for Nausea, vomiting, cramping or diarrhea   Insulin Glargine (BASAGLAR KWIKPEN) 100 UNIT/ML Inject 18 units once daily as directed for diabetes.   lactulose (CHRONULAC) 10 GM/15ML solution Take 15 ml (3 tsp) once or twice daily as needed for constipation.   Lancet Devices (ADJUSTABLE LANCING DEVICE) MISC CHECK BLOOD SUGAR 3 TIMES A DAY   Magnesium 400 MG TABS Take 1 tablet by mouth daily.   Multiple Vitamins-Minerals (HAIR/SKIN/NAILS) TABS Take by mouth 2 (two) times daily.   ONETOUCH VERIO test strip USE 1 STRIP TO CHECK GLUCOSE THREE TIMES DAILY AS DIRECTED   rOPINIRole (REQUIP) 3 MG tablet Take  1 to 2 tablets  at Bedtime  for "Restless Legs Syndrome"   rosuvastatin (  CRESTOR) 20 MG tablet TAKE 1 TABLET BY MOUTH ONCE DAILY FOR CHOLESTEROL   senna-docusate (SENOKOT-S) 8.6-50 MG tablet Take 1 tablet by mouth daily.   TRULICITY 1.5 VE/7.2CN SOPN INJECT 0.5ML (1.5MG) SUBCUTANEOUSLY ONCE WEEKLY   vitamin B-12 (CYANOCOBALAMIN) 1000 MCG tablet Take 1,000 mcg by mouth daily.   vitamin C (ASCORBIC ACID) 500 MG tablet Take 500 mg by mouth daily.   zinc gluconate 50 MG tablet Take 50 mg by mouth daily.   No current facility-administered medications for this visit. (Other)      REVIEW OF SYSTEMS:    ALLERGIES Allergies  Allergen Reactions   Morphine And Related Other (See Comments)    fuzzy feeling    PAST MEDICAL HISTORY Past Medical History:  Diagnosis Date   Arthritis     Chronic back pain    Cystoid macular edema of right eye 08/19/2019   Diabetic neuropathy (HCC)    Exudative age-related macular degeneration of left eye with inactive choroidal neovascularization ([place]) 11/18/2019   Hypercholesteremia    Hypertension    Liver lesion    Neuropathy    Type II or unspecified type diabetes mellitus without mention of complication, not stated as uncontrolled    Vitamin D deficiency    Wears glasses    Past Surgical History:  Procedure Laterality Date   ABDOMINAL HYSTERECTOMY     APPENDECTOMY     BACK SURGERY  1993   lumb lam-fusion   BREAST EXCISIONAL BIOPSY Right 1967   benign   CESAREAN SECTION     x 3    COLONOSCOPY     EYE SURGERY     both cataracts   TRIGGER FINGER RELEASE  01/10/2012   Procedure: RELEASE TRIGGER FINGER/A-1 PULLEY;  Surgeon: Hessie Dibble, MD;  Location: [facility];  Service: Orthopedics;  Laterality: Right;  right ring trigger release    FAMILY HISTORY Family History  Problem Relation Age of Onset   Diabetes Sister    Liver disease Sister    Liver cancer Sister    Lymphoma Sister    Stroke Brother    Diabetes Brother    Kidney cancer Brother    Colon cancer Neg Hx     SOCIAL HISTORY Social History   Tobacco Use   Smoking status: Never   Smokeless tobacco: Never  Substance Use Topics   Alcohol use: No   Drug use: No         OPHTHALMIC EXAM:  Base Eye Exam     Visual Acuity (ETDRS)       Right Left   Dist cc 20/100 +1 20/63 -1   Dist ph cc NI NI    Correction: Glasses         Tonometry (Tonopen, 8:53 AM)       Right Left   Pressure 9 12         Pupils       Pupils Dark Light Shape React APD   Right PERRL 2 1 Round Brisk None   Left PERRL 2 1 Round Brisk None         Visual Fields (Counting fingers)       Left Right    Full    Restrictions  Total inferior nasal deficiency         Extraocular Movement       Right Left    Full, Ortho Full, Ortho          Neuro/Psych     Oriented  x3: Yes   Mood/Affect: Normal         Dilation     Both eyes: 1.0% Mydriacyl, 2.5% Phenylephrine @ 8:53 AM           Slit Lamp and Fundus Exam     External Exam       Right Left   External Normal Normal         Slit Lamp Exam       Right Left   Lids/Lashes Normal Normal   Conjunctiva/Sclera White and quiet White and quiet   Cornea Clear Clear   Anterior Chamber Deep and quiet Deep and quiet   Iris Round and reactive Round and reactive   Lens Posterior chamber intraocular lens Posterior chamber intraocular lens   Anterior Vitreous Normal Normal         Fundus Exam       Right Left   Posterior Vitreous Posterior vitreous detachment Posterior vitreous detachment   Disc Normal Normal   C/D Ratio 0.4 0.45   Macula Geographic atrophy 3 DA size, Advanced age related macular degeneration, no macular thickening, Retinal pigment epithelial mottling, no exudates, no hemorrhage Geographic atrophy 2 DA size, Advanced age related macular degeneration, no macular thickening, Retinal pigment epithelial mottling, reddish hue in the parafoveal region, not in the FAZ   Vessels Normal, no DR Normal, no DR   Periphery Normal Normal            IMAGING AND PROCEDURES  Imaging and Procedures for 02/25/21  OCT, Retina - OU - Both Eyes       Right Eye Quality was good. Scan locations included subfoveal. Central Foveal Thickness: 226. Progression has been stable. Findings include no IRF, abnormal foveal contour, no SRF, disciform scar, central retinal atrophy, outer retinal atrophy, inner retinal atrophy.   Left Eye Quality was good. Scan locations included subfoveal. Central Foveal Thickness: 396. Findings include abnormal foveal contour, no SRF, no IRF, disciform scar, central retinal atrophy, inner retinal atrophy, outer retinal atrophy.   Notes No signs of active CNVM OS,  With small area of subretinal hemorrhage superior to the FAZ OD.   Intraretinal hemorrhage temporally as well, overall improved and stable at 10-week interval.  Much less retinal thickening post recent Avastin  OS with diffuse intraretinal hemorrhage subretinal hyper reflective material signifying new onset CNVM OS         Intravitreal Injection, Pharmacologic Agent - OD - Right Eye       Time Out 02/25/2021. 9:20 AM. Confirmed correct patient, procedure, site, and patient consented.   Anesthesia Topical anesthesia was used. Anesthetic medications included Lidocaine 4%.   Procedure Preparation included Tobramycin 0.3%, 10% betadine to eyelids, 5% betadine to ocular surface. A 30 gauge needle was used.   Injection: 2.5 mg bevacizumab 2.5 MG/0.1ML   Route: Intravitreal, Site: Right Eye   NDC: [id number], Lot: [id number]   Post-op Post injection exam found visual acuity of at least counting fingers. The patient tolerated the procedure well. There were no complications. The patient received written and verbal post procedure care education. Post injection medications were not given.      Intravitreal Injection, Pharmacologic Agent - OS - Left Eye       Time Out 02/25/2021. 9:19 AM. Confirmed correct patient, procedure, site, and patient consented.   Anesthesia Topical anesthesia was used. Anesthetic medications included Lidocaine 4%.   Procedure Preparation included 5% betadine to ocular surface, 10% betadine to eyelids, Tobramycin 0.3%. A 30 gauge needle  was used.   Injection: 2.5 mg bevacizumab 2.5 MG/0.1ML   Route: Intravitreal, Site: Left Eye   NDC: [phone]   Post-op Post injection exam found visual acuity of at least counting fingers. The patient tolerated the procedure well. There were no complications. The patient received written and verbal post procedure care education. Post injection medications were not given.              ASSESSMENT/PLAN:  Exudative age-related macular degeneration of right eye with active  choroidal neovascularization ([place]) Today at 10-week interval with maintained anatomy and much less active CNVM  Exudative age-related macular degeneration of left eye with active choroidal neovascularization ([place]) New onset recurrent CNVM 02-25-2021, will commence with therapy intravitreal Avastin OS today      ICD-10-CM   1. Exudative age-related macular degeneration of right eye with active choroidal neovascularization (HCC)  H35.3211 OCT, Retina - OU - Both Eyes    Intravitreal Injection, Pharmacologic Agent - OD - Right Eye    bevacizumab (AVASTIN) SOSY 2.5 mg    2. Exudative age-related macular degeneration of left eye with inactive choroidal neovascularization ([place])  H35.3222     3. Exudative age-related macular degeneration of right eye with inactive choroidal neovascularization ([place])  H35.3212     4. Exudative age-related macular degeneration of left eye with active choroidal neovascularization (HCC)  H35.3221 Intravitreal Injection, Pharmacologic Agent - OS - Left Eye    bevacizumab (AVASTIN) SOSY 2.5 mg      1.  He has maintained its macular anatomy and and stable vision at 10-week follow-up interval post Avastin repeat injection today due to multiple recurrences  2.  OS, new onset CNVM subfoveal with vision changes as well and OCT findings.  We will commence with intravitreal [employer] Avastin OS today and examination next in 6 weeks OU  3.  Late OU next  Ophthalmic Meds Ordered this visit:  Meds ordered this encounter  Medications   bevacizumab (AVASTIN) SOSY 2.5 mg   bevacizumab (AVASTIN) SOSY 2.5 mg       Return in about 6 weeks (around 04/08/2021) for DILATE OU, AVASTIN OCT, OD, OS.  There are no Patient Instructions on file for this visit.   Explained the diagnoses, plan, and follow up with the patient and they expressed understanding.  Patient expressed understanding of the importance of proper follow up care.   Clent Demark Lareen Mullings M.D. Diseases & Surgery of the  Retina and Vitreous Retina & Diabetic [place] 02/25/21     Abbreviations: M myopia (nearsighted); A astigmatism; H hyperopia (farsighted); P presbyopia; Mrx spectacle prescription;  CTL contact lenses; OD right eye; OS left eye; OU both eyes  XT exotropia; ET esotropia; PEK punctate epithelial keratitis; PEE punctate epithelial erosions; DES dry eye syndrome; MGD meibomian gland dysfunction; ATs artificial tears; PFAT's preservative free artificial tears; [place] nuclear sclerotic cataract; PSC posterior subcapsular cataract; ERM epi-retinal membrane; PVD posterior vitreous detachment; RD retinal detachment; DM diabetes mellitus; DR diabetic retinopathy; NPDR non-proliferative diabetic retinopathy; PDR proliferative diabetic retinopathy; CSME clinically significant macular edema; DME diabetic macular edema; dbh dot blot hemorrhages; CWS cotton wool spot; POAG primary open angle glaucoma; C/D cup-to-disc ratio; HVF humphrey visual field; GVF goldmann visual field; OCT optical coherence tomography; IOP intraocular pressure; BRVO Branch retinal vein occlusion; CRVO central retinal vein occlusion; CRAO central retinal artery occlusion; BRAO branch retinal artery occlusion; RT retinal tear; SB scleral buckle; PPV pars plana vitrectomy; VH Vitreous hemorrhage; PRP panretinal laser photocoagulation; IVK intravitreal kenalog; VMT vitreomacular  traction; MH Macular hole;  NVD neovascularization of the disc; NVE neovascularization elsewhere; AREDS age related eye disease study; ARMD age related macular degeneration; POAG primary open angle glaucoma; EBMD epithelial/anterior basement membrane dystrophy; ACIOL anterior chamber intraocular lens; IOL intraocular lens; PCIOL posterior chamber intraocular lens; Phaco/IOL phacoemulsification with intraocular lens placement; [place] photorefractive keratectomy; LASIK laser assisted in situ keratomileusis; HTN hypertension; DM diabetes mellitus; COPD chronic obstructive pulmonary  disease

## 2021-02-27 LAB — COMPLETE METABOLIC PANEL WITH GFR
AG Ratio: 1.4 (calc) (ref 1.0–2.5)
ALT: 14 U/L (ref 6–29)
AST: 22 U/L (ref 10–35)
Albumin: 3.8 g/dL (ref 3.6–5.1)
Alkaline phosphatase (APISO): 80 U/L (ref 37–153)
BUN/Creatinine Ratio: 14 (calc) (ref 6–22)
BUN: 22 mg/dL (ref 7–25)
CO2: 32 mmol/L (ref 20–32)
Calcium: 9.5 mg/dL (ref 8.6–10.4)
Chloride: 101 mmol/L (ref 98–110)
Creat: 1.52 mg/dL — ABNORMAL HIGH (ref 0.60–0.95)
Globulin: 2.7 g/dL (calc) (ref 1.9–3.7)
Glucose, Bld: 77 mg/dL (ref 65–99)
Potassium: 4.5 mmol/L (ref 3.5–5.3)
Sodium: 142 mmol/L (ref 135–146)
Total Bilirubin: 0.4 mg/dL (ref 0.2–1.2)
Total Protein: 6.5 g/dL (ref 6.1–8.1)
eGFR: 33 mL/min/{1.73_m2} — ABNORMAL LOW (ref >=60)

## 2021-02-27 LAB — FRUCTOSAMINE: Fructosamine: 345 umol/L — ABNORMAL HIGH (ref 205–285)

## 2021-03-01 ENCOUNTER — Telehealth: Payer: Self-pay

## 2021-03-01 NOTE — Telephone Encounter (Signed)
Paperwork submitted to the [employer] Patient Assistance Program for [employer]. Medication approved through the end of the calendar year of 2023.

## 2021-03-09 DIAGNOSIS — M4801 Spinal stenosis, occipito-atlanto-axial region: Secondary | ICD-10-CM | POA: Diagnosis not present

## 2021-03-09 DIAGNOSIS — M5136 Other intervertebral disc degeneration, lumbar region: Secondary | ICD-10-CM | POA: Diagnosis not present

## 2021-03-09 DIAGNOSIS — M5416 Radiculopathy, lumbar region: Secondary | ICD-10-CM | POA: Diagnosis not present

## 2021-03-15 DIAGNOSIS — R030 Elevated blood-pressure reading, without diagnosis of hypertension: Secondary | ICD-10-CM | POA: Diagnosis not present

## 2021-03-15 DIAGNOSIS — M48062 Spinal stenosis, lumbar region with neurogenic claudication: Secondary | ICD-10-CM | POA: Diagnosis not present

## 2021-03-15 DIAGNOSIS — M5416 Radiculopathy, lumbar region: Secondary | ICD-10-CM | POA: Diagnosis not present

## 2021-03-16 ENCOUNTER — Telehealth: Payer: Self-pay | Admitting: Internal Medicine

## 2021-03-16 NOTE — Chronic Care Management (AMB) (Signed)
  Chronic Care Management   Outreach Note  03/16/2021 Name: [name] MRN: [id number] DOB: Sep 13, 1929  Referred by: [name], MD Reason for referral : No chief complaint on file.   An unsuccessful telephone outreach was attempted today. The patient was referred to the pharmacist for assistance with care management and care coordination.   Follow Up Plan:   Tatjana Dellinger Upstream Scheduler

## 2021-03-22 ENCOUNTER — Telehealth: Payer: Self-pay | Admitting: Internal Medicine

## 2021-03-22 NOTE — Chronic Care Management (AMB) (Signed)
  Chronic Care Management   Note  03/22/2021 Name: KATELINE KINKADE MRN: [id number] DOB: 11/23/1929  SHANNYN JANKOWIAK is a 87 y.o. year old female who is a primary care patient of [name], MD. I reached out to Sheralyn Boatman by phone today in response to a referral sent by Ms. Dyanne Carrel Aquilino's PCP, [name], MD.   Ms. Lapp was given information about Chronic Care Management services today including:  CCM service includes personalized support from designated clinical staff supervised by her physician, including individualized plan of care and coordination with other care providers 24/7 contact phone numbers for assistance for urgent and routine care needs. Service will only be billed when office clinical staff spend 20 minutes or more in a month to coordinate care. Only one practitioner may furnish and bill the service in a calendar month. The patient may stop CCM services at any time (effective at the end of the month) by phone call to the office staff.   Patient agreed to services and verbal consent obtained.   Follow up plan:   Tatjana Secretary/administrator

## 2021-03-29 ENCOUNTER — Other Ambulatory Visit: Payer: Self-pay

## 2021-03-29 DIAGNOSIS — E118 Type 2 diabetes mellitus with unspecified complications: Secondary | ICD-10-CM

## 2021-03-29 DIAGNOSIS — Z794 Long term (current) use of insulin: Secondary | ICD-10-CM

## 2021-03-29 MED ORDER — BASAGLAR KWIKPEN 100 UNIT/ML ~~LOC~~ SOPN: PEN_INJECTOR | SUBCUTANEOUS | 0 refills | Status: DC

## 2021-03-29 MED ORDER — TRULICITY 1.5 MG/0.5ML ~~LOC~~ SOAJ: SUBCUTANEOUS | 3 refills | Status: DC

## 2021-03-30 ENCOUNTER — Ambulatory Visit: Payer: Medicare Other

## 2021-04-05 DIAGNOSIS — M48062 Spinal stenosis, lumbar region with neurogenic claudication: Secondary | ICD-10-CM | POA: Diagnosis not present

## 2021-04-05 DIAGNOSIS — M5416 Radiculopathy, lumbar region: Secondary | ICD-10-CM | POA: Diagnosis not present

## 2021-04-06 ENCOUNTER — Other Ambulatory Visit: Payer: Self-pay | Admitting: Internal Medicine

## 2021-04-07 ENCOUNTER — Other Ambulatory Visit: Payer: Self-pay

## 2021-04-07 ENCOUNTER — Ambulatory Visit (INDEPENDENT_AMBULATORY_CARE_PROVIDER_SITE_OTHER): Payer: Medicare Other | Admitting: Ophthalmology

## 2021-04-07 DIAGNOSIS — H353211 Exudative age-related macular degeneration, right eye, with active choroidal neovascularization: Secondary | ICD-10-CM

## 2021-04-07 DIAGNOSIS — H353124 Nonexudative age-related macular degeneration, left eye, advanced atrophic with subfoveal involvement: Secondary | ICD-10-CM

## 2021-04-07 DIAGNOSIS — H353222 Exudative age-related macular degeneration, left eye, with inactive choroidal neovascularization: Secondary | ICD-10-CM

## 2021-04-07 DIAGNOSIS — H353221 Exudative age-related macular degeneration, left eye, with active choroidal neovascularization: Secondary | ICD-10-CM | POA: Diagnosis not present

## 2021-04-07 MED ORDER — BEVACIZUMAB 2.5 MG/0.1ML IZ SOSY
2.5000 mg | PREFILLED_SYRINGE | INTRAVITREAL | Status: AC | PRN
Start: 2021-04-07 — End: 2021-04-07
  Administered 2021-04-07: 10:00:00 2.5 mg via INTRAVITREAL

## 2021-04-07 MED ORDER — BEVACIZUMAB 2.5 MG/0.1ML IZ SOSY
2.5000 mg | PREFILLED_SYRINGE | INTRAVITREAL | Status: AC | PRN
  Administered 2021-04-07: 10:00:00 2.5 mg via INTRAVITREAL

## 2021-04-07 NOTE — Assessment & Plan Note (Signed)
Stable improved at 6 weeks

## 2021-04-07 NOTE — Progress Notes (Deleted)
Patient ID: [name], female   DOB: 1929-11-25, 87 y.o.   MRN: [id number]  3 MONTH FOLLOW UP  Assessment:    Atherosclerosis of aorta ([name])- per CT 06/2017 Control blood pressure, cholesterol, glucose, increase exercise.   Essential hypertension Hypotensive - persistent - HOLD ziac unless trending up above 130/80, check BP BID and bring log close follow up *** DASH diet, exercise and monitor at home.  Uncontrolled secondary diabetes mellitus with stage 3 CKD (GFR 30-59) (HCC)/ Long term current use of insulin ([place]) Discussed general issues about diabetes pathophysiology and management., Educational material distributed., Suggested low cholesterol diet., Encouraged aerobic exercise., Discussed foot care., Reminded to get yearly retinal exam. A1C q106m NO GLUCOSE LOG TO REVIEW;  CONSISTENT/ONGOING POOR COMPLIANCE / POOR CONTROL/ HIGH RISK POOR OUTCOMES I feel she is not appropriate for current insulin regimen due to poor health literacy and compliance Plan to transition to basal insulin with GLP-1ra to simplify regimen and avoid need for self titration;  STOP TID dosing, advised 18 units BID, reduce by 2 units any glucose <100, call for further instructions CLOSE FOLLOW UP in 2 weeks or sooner LOW SUGAR IS MORE DANGEROUS THAN HIGH SUGAR EMPHASIZED  Close follow up in 2 weeks and repeated request she bring glucose/insulin log with her to review.  If able will transition to lantus with close follow up   Ttype 2 diabetes with neuropathy ([facility] Discussed general issues about diabetes pathophysiology and management., Educational material distributed. Encouraged aerobic exercise., Discussed foot care. She takes low dose gabapentin PRN with benefit  Mixed hyperlipidemia associated with T2DM ([place] - has restarted statin, LDL goal <70 - check lipids q341mdecrease fatty foods, increase activity.   CKD 3 associated with T2DM (HCC) Off of ACE/ARB per nephrology, Dr. GoMoshe Ciprofollows Avoid NSAIDs, encourage regular hydration Needs better control of glucose - see plan above Check CMP/GFR at routine visits  Chronic pain syndrome Ortho/pain management following, Monitor  Medication management Each visit  Vitamin D deficiency Continue to recommend supplementation for goal of 60-100 Check vitamin D level annually and after dose change  Major depression in remission ([place]  low dose wellbutrin for energy and motivation  Lifestyle discussed: diet/exerise, sleep hygiene, stress management, hydration   No orders of the defined types were placed in this encounter.   Future Appointments  Date Time Provider De[place]12/15/2022 11:30 AM CoLiane ComberNP GAAM-GAAIM None  05/03/2021 11:00 AM AdNewton PiggRPGalliaAAM-GAAIM None  05/26/2021 10:30 AM [place], GaClent DemarkMD RDE-RDE None  12/07/2021  2:00 PM CoLiane ComberNP GAAM-GAAIM None  12/23/2021 11:00 AM CoLiane ComberNP GAAM-GAAIM None     Subjective:   Kelsey Boatmanpresents for 3 month for poorly controlled T2DM on insulin with hld, CKD III, htn, vit D def, chronic pain, depression.  She has reported unsteady gait, chronic pain, follows [facility], also with diabetic neuropathy. Has been getting injections. This past year has been referred for PT for gait training, has cane and encouraged consistent use but only intermitently compliant with this. She does note PT helped and feels much more stable, denies falls since.   She has depression with reported lack of motivation and energy, now on wellbutrin 150 mg daily and doing well.   BMI is There is no height or weight on file to calculate BMI., she has been working on diet but hasn't been exercising, She could go to church  and walk in their gym, plans to go with husband or friend, motivated to start now that hips feel improved. Wt Readings from Last 3 Encounters:  02/23/21 114 lb (51.7 kg)   01/22/21 112 lb (50.8 kg)  01/06/21 112 lb (50.8 kg)   Patient is treated for HTN since 2000, takes ziac daily ***, reports losartan was d/c'd by nephrology. BP today was    She has aortic atherosclerosis per CT 2017 and 2019. Patient had a negative Cardiolite in 2009. Patient has had no complaints of any cardiac type chest pain, palpitations, dyspnea/orthopnea/PND, dizziness, claudication, or dependent edema.  She has hyperlipidemia; patient reported has recently restarted rosuvastatin 20 mg daily **. Patient denies myalgias or other med SEs. Last Lipids were not at goal  Lab Results  Component Value Date   CHOL 198 12/03/2020   HDL 56 12/03/2020   LDLCALC 115 (H) 12/03/2020   TRIG 157 (H) 12/03/2020   CHOLHDL 3.5 12/03/2020   Also, the patient has history of T2DM x 1996 and has been on insulin since 2000 and she has complications w/ CKD and Neuropathy.  Off of metformin due to CKD 3b; on bASA, statin Not on ACE/ARB per recommendation by nephrology Dr. Moshe Cipro Has intermittently seen endocrinology though not consistently, recently has been managed by our office though with suspected poor compliance with recommended diet/med regimen/appointments She was on novolin 70/30 but poor control/compliance Transitioned to trulicity 1.5 mg/week and basaglar *** Did not bring glucose log  She has had no symptoms of reactive hypoglycemia, diabetic polys or visual blurring. *** She does report tingling and occasional painful paresthesias of the feet.  She takes requip for RLS, reports hemp oil helping with foot pain.  Lab Results  Component Value Date   HGBA1C 13.9 (H) 12/03/2020   She follows with [facility] Dr. Moshe Cipro, advised to stay off of ACE/ARB (caused deterioration) and NSAIDs.  Lab Results  Component Value Date   GFRNONAA 37 (L) 09/02/2020   GFRNONAA 35 (L) 05/21/2020   GFRNONAA 29 (L) 02/04/2020   Further, the patient also has history of Vitamin D Deficiency,  unsure how much she takes, "3-4"  Lab Results  Component Value Date   VD25OH 67 12/03/2020   She takes B12 SL, ? 1000 mcg daily, she is unsure of dose Lab Results  Component Value Date   [id number] 016 12/03/2020       Current Outpatient Medications on File Prior to Visit  Medication Sig   acetaminophen (TYLENOL) 500 MG tablet Take 500-1,000 mg by mouth 3 (three) times daily as needed.   AquaLance Lancets 30G MISC CHECK BLOOD SUGAR 3 TIMES A DAY   aspirin 81 MG tablet Take 81 mg by mouth daily.   benzonatate (TESSALON) 100 MG capsule Take 1 capsule (100 mg total) by mouth every 8 (eight) hours.   Blood Glucose Calibration (ACCU-CHEK AVIVA) SOLN Check blood sugar 3 times daily-DX-E11.22   Blood Glucose Monitoring Suppl (ONETOUCH VERIO FLEX SYSTEM) w/Device KIT Check blood sugar 3 times daily-E11.8   buPROPion (WELLBUTRIN XL) 150 MG 24 hr tablet Take 1 tablet every Morning for Mood , Focus & Concentration   Cholecalciferol (VITAMIN D3) 2000 UNITS TABS Take 1 capsule by mouth 2 (two) times daily.   COMFORT EZ PEN NEEDLES 32G X 4 MM MISC USE 2 TIMES DAILY AS DIRECTED , 1 PEN NEEDLE PER INJECTION.   Continuous Blood Gluc Sensor (FREESTYLE LIBRE SENSOR SYSTEM) MISC Check blood sugar 3 times a day. Dx:E11.8, Z79.4  Continuous Blood Gluc Sensor MISC Check blood sugar 3 times a day- Dx: E11.8/Z79.4   dorzolamide-timolol (COSOPT) 22.3-6.8 MG/ML ophthalmic solution    Dulaglutide (TRULICITY) 1.5 FX/9.0WI SOPN INJECT 0.5ML (1.5MG) SUBCUTANEOUSLY ONCE WEEKLY   EASY COMFORT INSULIN SYRINGE 30G X 1/2" 0.5 ML MISC    gabapentin (NEURONTIN) 300 MG capsule Take 1 capsule 3 to 4 x / day for Painful Diabetic Neuropathy   hyoscyamine (LEVSIN SL) 0.125 MG SL tablet Take 1 to 2 tablets 3 to 4 x day if needed for Nausea, vomiting, cramping or diarrhea   Insulin Glargine (BASAGLAR KWIKPEN) 100 UNIT/ML Inject 18 units once daily as directed for diabetes.   lactulose (CHRONULAC) 10 GM/15ML solution Take 15 ml  (3 tsp) once or twice daily as needed for constipation.   Lancet Devices (ADJUSTABLE LANCING DEVICE) MISC CHECK BLOOD SUGAR 3 TIMES A DAY   latanoprost (XALATAN) 0.005 % ophthalmic solution    LOTEMAX 0.5 % GEL    Magnesium 400 MG TABS Take 1 tablet by mouth daily.   Multiple Vitamins-Minerals (HAIR/SKIN/NAILS) TABS Take by mouth 2 (two) times daily.   ONETOUCH VERIO test strip USE 1 STRIP TO CHECK GLUCOSE THREE TIMES DAILY AS DIRECTED   rOPINIRole (REQUIP) 3 MG tablet Take  1 to 2 tablets  at Bedtime  for "Restless Legs Syndrome"   rosuvastatin (CRESTOR) 20 MG tablet TAKE 1 TABLET BY MOUTH ONCE DAILY FOR CHOLESTEROL   senna-docusate (SENOKOT-S) 8.6-50 MG tablet Take 1 tablet by mouth daily.   vitamin B-12 (CYANOCOBALAMIN) 1000 MCG tablet Take 1,000 mcg by mouth daily.   vitamin C (ASCORBIC ACID) 500 MG tablet Take 500 mg by mouth daily.   zinc gluconate 50 MG tablet Take 50 mg by mouth daily.   No current facility-administered medications on file prior to visit.   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 involvement 01/08/2020   Memory changes 12/31/2019   Poorly controlled type 2 diabetes mellitus ([place]) 12/31/2019   Poor compliance with medication 12/31/2019   Advanced nonexudative age-related macular degeneration of right eye with subfoveal involvement 11/18/2019   Exudative age-related macular degeneration of right eye with active choroidal neovascularization ([place]) 08/19/2019   Exudative age-related macular degeneration of left eye with active choroidal neovascularization ([place]) 08/19/2019   Long term (current) use of insulin ([place]) 06/10/2019   Major depression in remission ([place]) 12/20/2018   Type 2 diabetes mellitus with complication, with long-term current use of insulin ([place]) 02/09/2017   Atherosclerosis of aorta ([place]) by AbdCTscan on 06/28/2017 11/06/2016    Encounter for Medicare annual wellness exam 05/18/2015   Medication management 08/06/2013   Neuropathy due to type 2 diabetes mellitus ([place]) 06/04/2013   CKD stage 3 due to type 2 diabetes mellitus ([place])    Hyperlipidemia associated with type 2 diabetes mellitus ([place])    Vitamin D deficiency    Hypertension 07/29/2012   Chronic pain 07/29/2012    Screening Tests  Immunization History  Administered Date(s) Administered   DT (Pediatric) 01/04/2011   Influenza Split 01/23/2013   Influenza, High Dose Seasonal PF 02/27/2014, 03/11/2015, 12/21/2015, 01/30/2017, 02/08/2018, 01/02/2019, 01/29/2021   Influenza-Unspecified 02/03/2020   PFIZER(Purple Top)SARS-COV-2 Vaccination 07/22/2019, 08/13/2019   Pneumococcal Conjugate-13 02/27/2014   Pneumococcal Polysaccharide-23 01/06/2012   Preventative care: Last colonoscopy: 2006 will not get another due to age [facility] 12/2019, reminded to schedule at breast center DEXA 2007 order placed to  schedule  Tetanus 2012 Flu - reports has already had for 2022 at pharmacy  Pneumo 2013 Prevnar 13 2015 Shingles vaccine declines Covid 19: 2/2, 2021, pfizer + 2 boosters  Names of Other Physician/Practitioners you currently use: 1. [facility] here for primary care 2. [facility] [place], eye doctor, last visit in 09/24/2020, no retinopathy, report abstracted,  3. Dr Sissy Hoff, DDS, dentist, last visit 2022, goes q88m Patient Care Team: MUnk Pinto MD as PCP - General (Internal Medicine) GWarden Fillers MD as Consulting Physician (Optometry) RZadie Rhine GClent Demark MD as Consulting Physician (Ophthalmology) RGlenna Fellows MD as Attending Physician (Neurosurgery) DMelrose Nakayama MD as Consulting Physician (Orthopedic Surgery) JMilus Banister MD as Attending Physician (Gastroenterology) ANewton Pigg [facility] Pharmacist (Pharmacist)   Allergies Allergies  Allergen Reactions   Morphine And Related Other (See  Comments)    fuzzy feeling    SURGICAL HISTORY She  has a past surgical history that includes Abdominal hysterectomy; Cesarean section; Back surgery (1993); Appendectomy; Colonoscopy; Eye surgery; Trigger finger release (01/10/2012); and Breast excisional biopsy (Right, 1967). FAMILY HISTORY Her family history includes Diabetes in her brother and sister; Kidney cancer in her brother; Liver cancer in her sister; Liver disease in her sister; Lymphoma in her sister; Stroke in her brother. SOCIAL HISTORY She  reports that she has never smoked. She has never used smokeless tobacco. She reports that she does not drink alcohol and does not use drugs.  MEDICARE WELLNESS OBJECTIVES: Physical activity:   Cardiac risk factors:   Depression/mood screen:   [facility] 12/23/2020  Decreased Interest 0  Down, Depressed, Hopeless 1  PHQ - 2 Score 1  Altered sleeping 0  Tired, decreased energy 1  Change in appetite 0  Feeling bad or failure about yourself  1  Trouble concentrating 0  Moving slowly or fidgety/restless 0  Suicidal thoughts 0  PHQ-9 Score 3  Difficult doing work/chores Somewhat difficult  Some recent data might be hidden    ADLs:  In your present state of health, do you have any difficulty performing the following activities: 12/23/2020 05/23/2020  Hearing? N N  Vision? Y N  Comment She follows with ophth for advanced macular degeneration. Able to read and drive short distances. -  Difficulty concentrating or making decisions? N N  Walking or climbing stairs? N N  Comment managing with cane -  Dressing or bathing? N N  Doing errands, shopping? N N  Some recent data might be hidden     Cognitive Testing  Alert? Yes  Normal Appearance?Yes  Oriented to person? Yes  Place? Yes   Time? Yes  Recall of three objects?  Yes  Can perform simple calculations? Yes  Displays appropriate judgment?Yes  Can read the correct time from a watch face?Yes  EOL planning:        Review of Systems  Constitutional:  Negative for malaise/fatigue and weight loss.  HENT:  Negative for hearing loss and tinnitus.   Eyes:  Negative for blurred vision and double vision.  Respiratory:  Negative for cough, shortness of breath and wheezing.   Cardiovascular:  Negative for chest pain, palpitations, orthopnea, claudication and leg swelling.  Gastrointestinal:  Positive for constipation. Negative for abdominal pain, blood in stool, diarrhea, heartburn, melena, nausea and vomiting.  Genitourinary: Negative.   Musculoskeletal:  Positive for back pain (ortho/neuro following) and joint pain (hips, improved after injections). Negative for myalgias.  Skin:  Negative for rash.  Neurological:  Negative for dizziness, tingling, sensory change, weakness and headaches.  Endo/Heme/Allergies:  Negative for polydipsia.  Psychiatric/Behavioral: Negative.  Negative for depression, memory loss and substance abuse. The patient is not nervous/anxious and does not have insomnia.   All other systems reviewed and are negative.   Objective:     There were no vitals taken for this visit.  General Appearance: Well nourished, alert, WD/WN, female elder  in no apparent distress. Eyes: PERRLA, EOMs, conjunctiva no swelling or erythema Sinuses: No frontal/maxillary tenderness ENT/Mouth: EACs patent / TMs  nl. Nares clear without erythema, swelling, mucoid exudates. Oral hygiene is good. No erythema, swelling, or exudate. Tongue normal, non-obstructing. Tonsils not swollen or erythematous. Hearing normal.  Neck: Supple, thyroid normal without palpable nodules. No bruits, nodes or JVD. Respiratory: Respiratory effort normal.  BS equal and clear bilateral without rales, rhonci, wheezing or stridor. Cardio: Heart sounds are normal with regular rate and rhythm and no murmurs, rubs or gallops. Peripheral pulses are normal and equal bilaterally without edema. No aortic or femoral bruits. Abdomen: Flat,  soft  with nl bowel sounds. Nontender, no guarding, rebound, hernias, masses, or organomegaly.  Lymphatics: Non tender without lymphadenopathy.  Musculoskeletal: Full ROM all peripheral extremities, joint stability, 5/5 strength, and slow steady gait.  Skin: warm/dry intact Neuro: Cranial nerves intact. DTR's UE  Nl/Equal and DTR's LE's absent. Normal muscle tone, no cerebellar symptoms. Sensation decreased R foot to monofilament.  Pysch: Alert and oriented X 3, normal affect, Insight and Judgment are fair GU: defer, no concerns  Medicare Attestation I have personally reviewed: The patient's medical and social history Their use of alcohol, tobacco or illicit drugs Their current medications and supplements The patient's functional ability including ADLs,fall risks, home safety risks, cognitive, and hearing and visual impairment Diet and physical activities Evidence for depression or mood disorders  The patient's weight, height, BMI, and visual acuity have been recorded in the chart.  I have made referrals, counseling, and provided education to the patient based on review of the above and I have provided the patient with a written personalized care plan for preventive services.     Kelsey Ribas, NP   04/07/2021

## 2021-04-07 NOTE — Assessment & Plan Note (Signed)
Progressive OS

## 2021-04-07 NOTE — Progress Notes (Signed)
04/07/2021     CHIEF COMPLAINT Patient presents for  Chief Complaint  Patient presents with   Retina Follow Up      HISTORY OF PRESENT ILLNESS: [name] is a 87 y.o. female who presents to the clinic today for:   HPI     Retina Follow Up           Diagnosis: Wet AMD   Laterality: both eyes   Onset: 6 weeks ago   Duration: 6 weeks   Course: gradually worsening ([place])         Comments   6 week fu OU oct avastin OU. Pt states vision is worse close up, states it is hard to write Christmas cards. Pt states no difference in distance vision. Pt states she feels like she is okay to drive. She states she can't see the road signs unless she is closer to them. Denies new FOL or floaters.      Last edited by Laurin Coder on 04/07/2021  9:30 AM.      Referring physician: [name], MD [address] [place],  [place] [address]  HISTORICAL INFORMATION:   Selected notes from the MEDICAL RECORD NUMBER    Lab Results  Component Value Date   HGBA1C 13.9 (H) 12/03/2020     CURRENT MEDICATIONS: Current Outpatient Medications (Ophthalmic Drugs)  Medication Sig   dorzolamide-timolol (COSOPT) 22.3-6.8 MG/ML ophthalmic solution    latanoprost (XALATAN) 0.005 % ophthalmic solution    LOTEMAX 0.5 % GEL    No current facility-administered medications for this visit. (Ophthalmic Drugs)   Current Outpatient Medications (Other)  Medication Sig   acetaminophen (TYLENOL) 500 MG tablet Take 500-1,000 mg by mouth 3 (three) times daily as needed.   AquaLance Lancets 30G MISC CHECK BLOOD SUGAR 3 TIMES A DAY   aspirin 81 MG tablet Take 81 mg by mouth daily.   benzonatate (TESSALON) 100 MG capsule Take 1 capsule (100 mg total) by mouth every 8 (eight) hours.   Blood Glucose Calibration (ACCU-CHEK AVIVA) SOLN Check blood sugar 3 times daily-DX-E11.22   Blood Glucose Monitoring Suppl (ONETOUCH VERIO FLEX SYSTEM) w/Device KIT Check blood sugar 3  times daily-E11.8   buPROPion (WELLBUTRIN XL) 150 MG 24 hr tablet Take 1 tablet every Morning for Mood , Focus & Concentration   Cholecalciferol (VITAMIN D3) 2000 UNITS TABS Take 1 capsule by mouth 2 (two) times daily.   COMFORT EZ PEN NEEDLES 32G X 4 MM MISC USE 2 TIMES DAILY AS DIRECTED , 1 PEN NEEDLE PER INJECTION.   Continuous Blood Gluc Sensor (FREESTYLE LIBRE SENSOR SYSTEM) MISC Check blood sugar 3 times a day. Dx:E11.8, Z79.4   Continuous Blood Gluc Sensor MISC Check blood sugar 3 times a day- Dx: E11.8/Z79.4   Dulaglutide (TRULICITY) 1.5 DX/4.1OI SOPN INJECT 0.5ML (1.5MG) SUBCUTANEOUSLY ONCE WEEKLY   EASY COMFORT INSULIN SYRINGE 30G X 1/2" 0.5 ML MISC    gabapentin (NEURONTIN) 300 MG capsule Take 1 capsule 3 to 4 x / day for Painful Diabetic Neuropathy   hyoscyamine (LEVSIN SL) 0.125 MG SL tablet Take 1 to 2 tablets 3 to 4 x day if needed for Nausea, vomiting, cramping or diarrhea   Insulin Glargine (BASAGLAR KWIKPEN) 100 UNIT/ML Inject 18 units once daily as directed for diabetes.   lactulose (CHRONULAC) 10 GM/15ML solution Take 15 ml (3 tsp) once or twice daily as needed for constipation.   Lancet Devices (ADJUSTABLE LANCING DEVICE) MISC CHECK BLOOD SUGAR 3 TIMES  A DAY   Magnesium 400 MG TABS Take 1 tablet by mouth daily.   Multiple Vitamins-Minerals (HAIR/SKIN/NAILS) TABS Take by mouth 2 (two) times daily.   ONETOUCH VERIO test strip USE 1 STRIP TO CHECK GLUCOSE THREE TIMES DAILY AS DIRECTED   rOPINIRole (REQUIP) 3 MG tablet Take  1 to 2 tablets  at Bedtime  for "Restless Legs Syndrome"   rosuvastatin (CRESTOR) 20 MG tablet TAKE 1 TABLET BY MOUTH ONCE DAILY FOR CHOLESTEROL   senna-docusate (SENOKOT-S) 8.6-50 MG tablet Take 1 tablet by mouth daily.   vitamin B-12 (CYANOCOBALAMIN) 1000 MCG tablet Take 1,000 mcg by mouth daily.   vitamin C (ASCORBIC ACID) 500 MG tablet Take 500 mg by mouth daily.   zinc gluconate 50 MG tablet Take 50 mg by mouth daily.   No current  facility-administered medications for this visit. (Other)      REVIEW OF SYSTEMS:    ALLERGIES Allergies  Allergen Reactions   Morphine And Related Other (See Comments)    fuzzy feeling    PAST MEDICAL HISTORY Past Medical History:  Diagnosis Date   Arthritis    Chronic back pain    Cystoid macular edema of right eye 08/19/2019   Diabetic neuropathy (HCC)    Exudative age-related macular degeneration of left eye with inactive choroidal neovascularization ([facility]) 11/18/2019   Hypercholesteremia    Hypertension    Liver lesion    Neuropathy    Type II or unspecified type diabetes mellitus without mention of complication, not stated as uncontrolled    Vitamin D deficiency    Wears glasses    Past Surgical History:  Procedure Laterality Date   ABDOMINAL HYSTERECTOMY     APPENDECTOMY     BACK SURGERY  1993   lumb lam-fusion   BREAST EXCISIONAL BIOPSY Right 1967   benign   CESAREAN SECTION     x 3    COLONOSCOPY     EYE SURGERY     both cataracts   TRIGGER FINGER RELEASE  01/10/2012   Procedure: RELEASE TRIGGER FINGER/A-1 PULLEY;  Surgeon: Hessie Dibble, MD;  Location: [facility];  Service: Orthopedics;  Laterality: Right;  right ring trigger release    FAMILY HISTORY Family History  Problem Relation Age of Onset   Diabetes Sister    Liver disease Sister    Liver cancer Sister    Lymphoma Sister    Stroke Brother    Diabetes Brother    Kidney cancer Brother    Colon cancer Neg Hx     SOCIAL HISTORY Social History   Tobacco Use   Smoking status: Never   Smokeless tobacco: Never  Substance Use Topics   Alcohol use: No   Drug use: No         OPHTHALMIC EXAM:  Base Eye Exam     Visual Acuity (ETDRS)       Right Left   Dist cc 20/100 -2 20/80 -2   Dist ph cc NI NI    Correction: Glasses         Tonometry (Tonopen, 9:34 AM)       Right Left   Pressure 15 14         Pupils       Pupils Dark Light Shape React APD    Right PERRL 2 1 Round Brisk None   Left PERRL 2 1 Round Brisk None         Visual Fields  Left Right   Restrictions Partial inner superior temporal, inferior temporal, superior nasal, inferior nasal deficiencies Partial inner superior temporal, inferior temporal, superior nasal, inferior nasal deficiencies         Extraocular Movement       Right Left    Full Full         Neuro/Psych     Oriented x3: Yes   Mood/Affect: Normal         Dilation     Both eyes: 1.0% Mydriacyl, 2.5% Phenylephrine @ 9:34 AM           Slit Lamp and Fundus Exam     External Exam       Right Left   External Normal Normal         Slit Lamp Exam       Right Left   Lids/Lashes Normal Normal   Conjunctiva/Sclera White and quiet White and quiet   Cornea Clear Clear   Anterior Chamber Deep and quiet Deep and quiet   Iris Round and reactive Round and reactive   Lens Posterior chamber intraocular lens Posterior chamber intraocular lens   Anterior Vitreous Normal Normal         Fundus Exam       Right Left   Posterior Vitreous Posterior vitreous detachment Posterior vitreous detachment   Disc Normal Normal   C/D Ratio 0.4 0.45   Macula Geographic atrophy 3 DA size, Advanced age related macular degeneration, no macular thickening, Retinal pigment epithelial mottling, no exudates, no hemorrhage Geographic atrophy 2 DA size, Advanced age related macular degeneration, no macular thickening, Retinal pigment epithelial mottling, reddish hue in the parafoveal region, not in the FAZ   Vessels Normal, no DR Normal, no DR   Periphery Normal Normal            IMAGING AND PROCEDURES  Imaging and Procedures for 04/07/21  Intravitreal Injection, Pharmacologic Agent - OD - Right Eye       Time Out 04/07/2021. 10:14 AM. Confirmed correct patient, procedure, site, and patient consented.   Anesthesia Topical anesthesia was used. Anesthetic medications included Lidocaine 4%.    Procedure Preparation included Tobramycin 0.3%, 10% betadine to eyelids, 5% betadine to ocular surface. A 30 gauge needle was used.   Injection: 2.5 mg bevacizumab 2.5 MG/0.1ML   Route: Intravitreal, Site: Right Eye   NDC: [phone], Lot: [id number]   Post-op Post injection exam found visual acuity of at least counting fingers. The patient tolerated the procedure well. There were no complications. The patient received written and verbal post procedure care education. Post injection medications were not given.      Intravitreal Injection, Pharmacologic Agent - OS - Left Eye       Time Out 04/07/2021. 10:14 AM. Confirmed correct patient, procedure, site, and patient consented.   Anesthesia Topical anesthesia was used. Anesthetic medications included Lidocaine 4%.   Procedure Preparation included 5% betadine to ocular surface, 10% betadine to eyelids, Tobramycin 0.3%. A 30 gauge needle was used.   Injection: 2.5 mg bevacizumab 2.5 MG/0.1ML   Route: Intravitreal, Site: Left Eye   NDC: [phone], Lot: [id number]   Post-op Post injection exam found visual acuity of at least counting fingers. The patient tolerated the procedure well. There were no complications. The patient received written and verbal post procedure care education. Post injection medications were not given.      OCT, Retina - OU - Both Eyes       Right Eye Quality was good.  Scan locations included subfoveal. Central Foveal Thickness: 291. Progression has been stable. Findings include no IRF, abnormal foveal contour, no SRF, disciform scar, central retinal atrophy, outer retinal atrophy, inner retinal atrophy.   Left Eye Quality was good. Scan locations included subfoveal. Central Foveal Thickness: 381. Findings include abnormal foveal contour, no SRF, no IRF, disciform scar, central retinal atrophy, inner retinal atrophy, outer retinal atrophy.   Notes No signs of active CNVM OS,  With small area of  subretinal hemorrhage superior to the FAZ OD.  Intraretinal hemorrhage temporally as well, overall improved and stable at 6-week interval.  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 Exudative age-related macular degeneration of right eye with active choroidal neovascularization (HCC)  H35.3211 Intravitreal Injection, Pharmacologic Agent - OD - Right Eye    OCT, Retina - OU - Both Eyes    bevacizumab (AVASTIN) SOSY 2.5 mg    2. 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)  H35.3222 Intravitreal Injection, Pharmacologic Agent - OS - Left Eye    OCT, Retina - OU - Both Eyes    bevacizumab (AVASTIN) SOSY 2.5 mg    3. 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 Exudative age-related macular degeneration of left eye with active choroidal neovascularization ([place])  H35.3221       1.  Bilateral wet AMD.  Stable today if not improved at 6-week interval.  Repeat injection OU and to keep condition stable  2.  3.  Ophthalmic Meds Ordered this visit:  Meds ordered this encounter  Medications   bevacizumab (AVASTIN) SOSY 2.5 mg   bevacizumab (AVASTIN) SOSY 2.5 mg       Return in about 7 weeks (around  05/26/2021) for DILATE OU, AVASTIN OCT, OD, OS.  There are no Patient Instructions on file for this visit.   Explained the diagnoses, plan, and follow up with the patient and they expressed understanding.  Patient expressed understanding of the importance of proper follow up care.   Clent Demark Lacheryl Niesen M.D. Diseases & Surgery of the Retina and Vitreous [facility] [place] 04/07/21     Abbreviations: M myopia (nearsighted); A astigmatism; H hyperopia (farsighted); P presbyopia; Mrx spectacle prescription;  CTL contact lenses; OD right eye; OS left eye; OU both eyes  XT exotropia; ET esotropia; PEK punctate epithelial keratitis; PEE punctate epithelial erosions; DES dry eye syndrome; MGD meibomian gland dysfunction; ATs artificial tears; PFAT's preservative free artificial tears; [place] nuclear sclerotic cataract; PSC posterior subcapsular cataract; ERM epi-retinal membrane; PVD posterior vitreous detachment; RD retinal detachment; DM diabetes mellitus; DR diabetic retinopathy; NPDR non-proliferative diabetic retinopathy; PDR proliferative diabetic retinopathy; CSME clinically significant macular edema; DME diabetic macular edema; dbh dot blot hemorrhages; CWS cotton wool spot; POAG primary open angle glaucoma; C/D cup-to-disc ratio; HVF humphrey visual field; GVF goldmann  visual field; OCT optical coherence tomography; IOP intraocular pressure; BRVO Branch retinal vein occlusion; CRVO central retinal vein occlusion; CRAO central retinal artery occlusion; BRAO branch retinal artery occlusion; RT retinal tear; SB scleral buckle; PPV pars plana vitrectomy; VH Vitreous hemorrhage; PRP panretinal laser photocoagulation; IVK intravitreal kenalog; VMT vitreomacular traction; MH Macular hole;  NVD neovascularization of the disc; NVE neovascularization elsewhere; AREDS age related eye disease study; ARMD age related macular degeneration; POAG primary open angle glaucoma; EBMD epithelial/anterior basement  membrane dystrophy; ACIOL anterior chamber intraocular lens; IOL intraocular lens; PCIOL posterior chamber intraocular lens; Phaco/IOL phacoemulsification with intraocular lens placement; [place] photorefractive keratectomy; LASIK laser assisted in situ keratomileusis; HTN hypertension; DM diabetes mellitus; COPD chronic obstructive pulmonary disease

## 2021-04-07 NOTE — Assessment & Plan Note (Addendum)
Less thickening overall, less subretinal hyper reflective material, at 6-week follow-up today repeat injection today

## 2021-04-08 ENCOUNTER — Ambulatory Visit: Payer: Medicare Other | Admitting: Adult Health

## 2021-04-08 DIAGNOSIS — E1122 Type 2 diabetes mellitus with diabetic chronic kidney disease: Secondary | ICD-10-CM

## 2021-04-08 DIAGNOSIS — E1140 Type 2 diabetes mellitus with diabetic neuropathy, unspecified: Secondary | ICD-10-CM

## 2021-04-08 DIAGNOSIS — E559 Vitamin D deficiency, unspecified: Secondary | ICD-10-CM

## 2021-04-08 DIAGNOSIS — F325 Major depressive disorder, single episode, in full remission: Secondary | ICD-10-CM

## 2021-04-08 DIAGNOSIS — I10 Essential (primary) hypertension: Secondary | ICD-10-CM

## 2021-04-08 DIAGNOSIS — Z794 Long term (current) use of insulin: Secondary | ICD-10-CM

## 2021-04-08 DIAGNOSIS — E1165 Type 2 diabetes mellitus with hyperglycemia: Secondary | ICD-10-CM

## 2021-04-08 DIAGNOSIS — G8929 Other chronic pain: Secondary | ICD-10-CM

## 2021-04-08 DIAGNOSIS — E118 Type 2 diabetes mellitus with unspecified complications: Secondary | ICD-10-CM

## 2021-04-08 DIAGNOSIS — Z79899 Other long term (current) drug therapy: Secondary | ICD-10-CM

## 2021-04-08 DIAGNOSIS — I700 Atherosclerosis of aorta: Secondary | ICD-10-CM

## 2021-04-08 DIAGNOSIS — E1169 Type 2 diabetes mellitus with other specified complication: Secondary | ICD-10-CM

## 2021-04-21 ENCOUNTER — Other Ambulatory Visit: Payer: Self-pay | Admitting: Internal Medicine

## 2021-04-21 DIAGNOSIS — G2581 Restless legs syndrome: Secondary | ICD-10-CM

## 2021-04-21 DIAGNOSIS — E1140 Type 2 diabetes mellitus with diabetic neuropathy, unspecified: Secondary | ICD-10-CM

## 2021-04-24 NOTE — Progress Notes (Signed)
Patient ID: [name], female   DOB: 1930/04/20, 87 y.o.   MRN: [id number]  3 MONTH FOLLOW UP  Assessment:    Atherosclerosis of aorta ([place])- per CT 06/2017 Control blood pressure, cholesterol, glucose, increase exercise.   Essential hypertension At goal off of meds DASH diet, exercise and monitor at home.  Uncontrolled secondary diabetes mellitus with stage 3 CKD (GFR 30-59) (HCC)/ Long term current use of insulin ([place]) Discussed general issues about diabetes pathophysiology and management., Educational material distributed., Suggested low cholesterol diet., Encouraged aerobic exercise., Discussed foot care., Reminded to get yearly retinal exam. A1C q68m- much improved with truliticy and basaglar - will reduce dose to 14-16 with goal fasting 100-130 to avoid hypoglycemia in elderly diabetic - contact office with any glucose <80  Ttype 2 diabetes with neuropathy ([facility] Discussed general issues about diabetes pathophysiology and management., Educational material distributed. Encouraged aerobic exercise., Discussed foot care. She takes low dose gabapentin PRN with benefit  Mixed hyperlipidemia associated with T2DM ([place] - has restarted statin, LDL goal <70 - check lipids q331mdecrease fatty foods, increase activity.   CKD 3 associated with T2DM (HCRedgraniteOff of ACE/ARB per nephrology, Dr. GoMoshe Ciproollows Avoid NSAIDs, encourage regular hydration Needs better control of glucose - see plan above Check CMP/GFR at routine visits  Chronic pain syndrome Ortho/pain management following, Monitor  Medication management Each visit  Vitamin D deficiency Continue to recommend supplementation for goal of 60-100 Check vitamin D level annually and after dose change  Major depression in remission (HCFre[place]  low dose wellbutrin for energy and motivation  Lifestyle discussed: diet/exerise, sleep hygiene, stress management, hydration   Orders Placed This Encounter  Procedures   CBC with  Differential/Platelet   COMPLETE METABOLIC PANEL WITH GFR   Magnesium   Lipid panel   TSH   Hemoglobin A1c    Future Appointments  Date Time Provider [place]1/12/2021 11:00 AM AdNewton PiggRPIreland [facility] None  05/26/2021 10:30 AM [place], GaClent DemarkMD RDE-RDE None  12/07/2021  2:00 PM CoLiane ComberNP GAAM-GAAIM None  12/23/2021 11:00 AM CoLiane ComberNP GAAM-GAAIM None     Subjective:   Kelsey Boatmanpresents for 3 month for poorly controlled T2DM on insulin with hld, CKD III, htn, vit D def, chronic pain, depression.  She has reported unsteady gait, chronic pain, follows with [facility]Norm [facility] Dr. StVertell Limbersupplies tramadol) also with diabetic neuropathy. Has been getting injections. This past year has been referred for PT for gait training, has cane and encouraged consistent use but only intermitently compliant with this. She does note PT helped and feels much more stable, denies falls since.   She has depression with reported lack of motivation and energy, now on wellbutrin 150 mg daily with improvement. However SIL just passed yesterday, age [age]feeling a bit down due to this.   BMI is Body mass index is 20.03 kg/m., she has been working on diet but hasn't been exercising, She could go to church and walk in their gym, plans to go with husband or friend, motivated to start now that hips feel improved. Wt Readings from Last 3 Encounters:  04/27/21 114 lb (51.7 kg)  02/23/21 114 lb (51.7 kg)  01/22/21 112 lb (50.8 kg)   Patient is treated for HTN since 2000, off of all meds reports losartan was d/c'd by nephrology. BP today was BP: 140/68  She has aortic atherosclerosis per CT 2017 and 2019.  Patient had a negative Cardiolite in 2009. Patient has had no complaints of any cardiac type chest pain, palpitations, dyspnea/orthopnea/PND, dizziness, claudication, or dependent edema.  She has hyperlipidemia; patient reported has  restarted rosuvastatin 20 mg daily . Patient denies myalgias or other med SEs. Last Lipids were not at goal  Lab Results  Component Value Date   CHOL 198 12/03/2020   HDL 56 12/03/2020   LDLCALC 115 (H) 12/03/2020   TRIG 157 (H) 12/03/2020   CHOLHDL 3.5 12/03/2020   Also, the patient has history of T2DM x 1996 and has been on insulin since 2000 and she has complications w/ CKD and Neuropathy.  Off of metformin due to CKD 3b; on bASA, statin Not on ACE/ARB per recommendation by nephrology Dr. Moshe Cipro Has intermittently seen endocrinology though not consistently, recently has been managed by our office though with suspected poor compliance with recommended diet/med regimen/appointments She was on novolin 70/30 but poor control/compliance, very labile, struggled with titration Transitioned to trulicity 1.5 mg/week and basaglar 18 units Reports fasting ranges 85-90s with rare outliers, did have 1 hypoglycemic episode of 65 in AM She does report tingling and occasional painful paresthesias of the feet.  She takes requip for RLS, reports hemp oil helping with foot pain.  Lab Results  Component Value Date   HGBA1C 13.9 (H) 12/03/2020   She follows with [facility] Dr. Moshe Cipro, advised to stay off of ACE/ARB (caused deterioration) and NSAIDs.  Lab Results  Component Value Date   GFRNONAA 37 (L) 09/02/2020   GFRNONAA 35 (L) 05/21/2020   GFRNONAA 29 (L) 02/04/2020   Further, the patient also has history of Vitamin D Deficiency, unsure how much she takes, "3-4"  Lab Results  Component Value Date   VD25OH 67 12/03/2020       Current Outpatient Medications on File Prior to Visit  Medication Sig   acetaminophen (TYLENOL) 500 MG tablet Take 500-1,000 mg by mouth 3 (three) times daily as needed.   AquaLance Lancets 30G MISC CHECK BLOOD SUGAR 3 TIMES A DAY   aspirin 81 MG tablet Take 81 mg by mouth daily.   benzonatate (TESSALON) 100 MG capsule Take 1 capsule (100 mg total)  by mouth every 8 (eight) hours.   Blood Glucose Calibration (ACCU-CHEK AVIVA) SOLN Check blood sugar 3 times daily-DX-E11.22   Blood Glucose Monitoring Suppl (ONETOUCH VERIO FLEX SYSTEM) w/Device KIT Check blood sugar 3 times daily-E11.8   buPROPion (WELLBUTRIN XL) 150 MG 24 hr tablet Take 1 tablet every Morning for Mood , Focus & Concentration   Cholecalciferol (VITAMIN D3) 2000 UNITS TABS Take 1 capsule by mouth 2 (two) times daily.   COMFORT EZ PEN NEEDLES 32G X 4 MM MISC USE 2 TIMES DAILY AS DIRECTED , 1 PEN NEEDLE PER INJECTION.   Continuous Blood Gluc Sensor (FREESTYLE LIBRE SENSOR SYSTEM) MISC Check blood sugar 3 times a day. Dx:E11.8, Z79.4   Continuous Blood Gluc Sensor MISC Check blood sugar 3 times a day- Dx: E11.8/Z79.4   dorzolamide-timolol (COSOPT) 22.3-6.8 MG/ML ophthalmic solution    Dulaglutide (TRULICITY) 1.5 YT/1.1NB SOPN INJECT 0.5ML (1.5MG) SUBCUTANEOUSLY ONCE WEEKLY   EASY COMFORT INSULIN SYRINGE 30G X 1/2" 0.5 ML MISC    gabapentin (NEURONTIN) 300 MG capsule Take 1 capsule 3 to 4 x / day for Painful Diabetic Neuropathy   hyoscyamine (LEVSIN SL) 0.125 MG SL tablet Take 1 to 2 tablets 3 to 4 x day if needed for Nausea, vomiting, cramping or diarrhea   Insulin Glargine (BASAGLAR  KWIKPEN) 100 UNIT/ML Inject 18 units once daily as directed for diabetes.   lactulose (CHRONULAC) 10 GM/15ML solution Take 15 ml (3 tsp) once or twice daily as needed for constipation.   Lancet Devices (ADJUSTABLE LANCING DEVICE) MISC CHECK BLOOD SUGAR 3 TIMES A DAY   latanoprost (XALATAN) 0.005 % ophthalmic solution    LOTEMAX 0.5 % GEL    Magnesium 400 MG TABS Take 1 tablet by mouth daily.   Multiple Vitamins-Minerals (HAIR/SKIN/NAILS) TABS Take by mouth 2 (two) times daily.   ONETOUCH VERIO test strip USE 1 STRIP TO CHECK GLUCOSE THREE TIMES DAILY AS DIRECTED   rOPINIRole (REQUIP) 3 MG tablet Take 1 to 2 tablets  at Bedtime  for Restless Legs                                                                /                     TAKE  BY MOUTH   rosuvastatin (CRESTOR) 20 MG tablet TAKE 1 TABLET BY MOUTH ONCE DAILY FOR CHOLESTEROL   senna-docusate (SENOKOT-S) 8.6-50 MG tablet Take 1 tablet by mouth daily.   vitamin B-12 (CYANOCOBALAMIN) 1000 MCG tablet Take 1,000 mcg by mouth daily.   vitamin C (ASCORBIC ACID) 500 MG tablet Take 500 mg by mouth daily.   zinc gluconate 50 MG tablet Take 50 mg by mouth daily.   No current facility-administered medications on file prior to visit.   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 involvement 01/08/2020   Memory changes 12/31/2019   Poorly controlled type 2 diabetes mellitus ([place]) 12/31/2019   Poor compliance with medication 12/31/2019   Advanced nonexudative age-related macular degeneration of right eye with subfoveal involvement 11/18/2019   Exudative age-related macular degeneration of right eye with active choroidal neovascularization ([place]) 08/19/2019   Exudative age-related macular degeneration of left eye with active choroidal neovascularization ([place]) 08/19/2019   Long term (current) use of insulin ([place]) 06/10/2019   Major depression in remission ([place]) 12/20/2018   Type 2 diabetes mellitus with complication, with long-term current use of insulin ([place]) 02/09/2017   Atherosclerosis of aorta ([place]) by AbdCTscan on 06/28/2017 11/06/2016   Encounter for Medicare annual wellness exam 05/18/2015   Medication management 08/06/2013   Neuropathy due to type 2 diabetes mellitus ([place]) 06/04/2013   CKD stage 3 due to type 2 diabetes mellitus ([place])    Hyperlipidemia associated with type 2 diabetes mellitus ([place])    Vitamin D deficiency    Hypertension 07/29/2012   Chronic pain 07/29/2012    Screening Tests  Immunization History  Administered Date(s) Administered   DT (Pediatric) 01/04/2011   Influenza Split 01/23/2013    Influenza, High Dose Seasonal PF 02/27/2014, 03/11/2015, 12/21/2015, 01/30/2017, 02/08/2018, 01/02/2019, 01/29/2021   Influenza-Unspecified 02/03/2020   PFIZER(Purple Top)SARS-COV-2 Vaccination 07/22/2019, 08/13/2019   Pneumococcal Conjugate-13 02/27/2014   Pneumococcal Polysaccharide-23 01/06/2012   Preventative care: Last colonoscopy: 2006 will not get another due to age [facility] 12/2019, reminded to schedule at breast center DEXA 2007 order placed to schedule  Tetanus 2012 Flu - reports has already had for 2022 at pharmacy  Pneumo  2013 Prevnar 13 2015 Shingles vaccine declines Covid 19: 2/2, 2021, pfizer + 2 boosters  Names of Other Physician/Practitioners you currently use: 1. [facility] here for primary care 2. [facility] [place], eye doctor, last visit in 09/24/2020, no retinopathy, report abstracted,  3. Dr Sissy Hoff, DDS, dentist, last visit 2022, goes q41m Patient Care Team: MUnk Pinto MD as PCP - General (Internal Medicine) GWarden Fillers MD as Consulting Physician (Optometry) RZadie Rhine GClent Demark MD as Consulting Physician (Ophthalmology) RGlenna Fellows MD as Attending Physician (Neurosurgery) DMelrose Nakayama MD as Consulting Physician (Orthopedic Surgery) JMilus Banister MD as Attending Physician (Gastroenterology) ANewton Pigg RInland Surgery Center LPas Pharmacist (Pharmacist)   Allergies Allergies  Allergen Reactions   Morphine And Related Other (See Comments)    fuzzy feeling    SURGICAL HISTORY She  has a past surgical history that includes Abdominal hysterectomy; Cesarean section; Back surgery (1993); Appendectomy; Colonoscopy; Eye surgery; Trigger finger release (01/10/2012); and Breast excisional biopsy (Right, 1967). FAMILY HISTORY Her family history includes Diabetes in her brother and sister; Kidney cancer in her brother; Liver cancer in her sister; Liver disease in her sister; Lymphoma in her sister; Stroke in her  brother. SOCIAL HISTORY She  reports that she has never smoked. She has never used smokeless tobacco. She reports that she does not drink alcohol and does not use drugs.    Review of Systems  Constitutional:  Negative for malaise/fatigue and weight loss.  HENT:  Negative for hearing loss and tinnitus.   Eyes:  Negative for blurred vision and double vision.  Respiratory:  Negative for cough, shortness of breath and wheezing.   Cardiovascular:  Negative for chest pain, palpitations, orthopnea, claudication and leg swelling.  Gastrointestinal:  Positive for constipation. Negative for abdominal pain, blood in stool, diarrhea, heartburn, melena, nausea and vomiting.  Genitourinary: Negative.   Musculoskeletal:  Positive for back pain (ortho/neuro following) and joint pain (hips, improved after injections). Negative for myalgias.  Skin:  Negative for rash.  Neurological:  Negative for dizziness, tingling, sensory change, weakness and headaches.  Endo/Heme/Allergies:  Negative for polydipsia.  Psychiatric/Behavioral: Negative.  Negative for depression, memory loss and substance abuse. The patient is not nervous/anxious and does not have insomnia.   All other systems reviewed and are negative.   Objective:     Blood pressure 140/68, pulse 78, temperature (!) 97.5 F (36.4 C), weight 114 lb (51.7 kg), SpO2 99 %.  General Appearance: Well nourished, alert, WD/WN, female elder  in no apparent distress. Eyes: PERRLA, EOMs, conjunctiva no swelling or erythema Sinuses: No frontal/maxillary tenderness ENT/Mouth: EACs patent / TMs  nl. Nares clear without erythema, swelling, mucoid exudates. Oral hygiene is good. No erythema, swelling, or exudate. Tongue normal, non-obstructing. Tonsils not swollen or erythematous. Hearing normal.  Neck: Supple, thyroid normal without palpable nodules. No bruits, nodes or JVD. Respiratory: Respiratory effort normal.  BS equal and clear bilateral without rales,  rhonci, wheezing or stridor. Cardio: Heart sounds are normal with regular rate and rhythm and no murmurs, rubs or gallops. Peripheral pulses are normal and equal bilaterally without edema. No aortic or femoral bruits. Abdomen: Flat, soft  with nl bowel sounds. Nontender, no guarding, rebound, hernias, masses, or organomegaly.  Lymphatics: Non tender without lymphadenopathy.  Musculoskeletal: Full ROM all peripheral extremities, joint stability, 5/5 strength, and slow steady gait.  Skin: warm/dry intact Neuro: Cranial nerves intact. DTR's UE  Nl/Equal and DTR's LE's absent. Normal muscle tone, no cerebellar symptoms. Sensation  decreased R foot to monofilament.  Pysch: Alert and oriented X 3, normal affect, Insight and Judgment are fair    Izora Ribas, NP   04/27/2021

## 2021-04-27 ENCOUNTER — Other Ambulatory Visit: Payer: Self-pay

## 2021-04-27 ENCOUNTER — Ambulatory Visit (INDEPENDENT_AMBULATORY_CARE_PROVIDER_SITE_OTHER): Payer: Medicare Other | Admitting: Adult Health

## 2021-04-27 ENCOUNTER — Encounter: Payer: Self-pay | Admitting: Adult Health

## 2021-04-27 VITALS — BP 140/68 | HR 78 | Temp 97.5°F | Wt 114.0 lb

## 2021-04-27 DIAGNOSIS — E1122 Type 2 diabetes mellitus with diabetic chronic kidney disease: Secondary | ICD-10-CM

## 2021-04-27 DIAGNOSIS — E559 Vitamin D deficiency, unspecified: Secondary | ICD-10-CM

## 2021-04-27 DIAGNOSIS — E1169 Type 2 diabetes mellitus with other specified complication: Secondary | ICD-10-CM | POA: Diagnosis not present

## 2021-04-27 DIAGNOSIS — F325 Major depressive disorder, single episode, in full remission: Secondary | ICD-10-CM

## 2021-04-27 DIAGNOSIS — I10 Essential (primary) hypertension: Secondary | ICD-10-CM | POA: Diagnosis not present

## 2021-04-27 DIAGNOSIS — E118 Type 2 diabetes mellitus with unspecified complications: Secondary | ICD-10-CM | POA: Diagnosis not present

## 2021-04-27 DIAGNOSIS — E1140 Type 2 diabetes mellitus with diabetic neuropathy, unspecified: Secondary | ICD-10-CM | POA: Diagnosis not present

## 2021-04-27 DIAGNOSIS — Z79899 Other long term (current) drug therapy: Secondary | ICD-10-CM

## 2021-04-27 DIAGNOSIS — I700 Atherosclerosis of aorta: Secondary | ICD-10-CM

## 2021-04-27 DIAGNOSIS — E785 Hyperlipidemia, unspecified: Secondary | ICD-10-CM | POA: Diagnosis not present

## 2021-04-27 DIAGNOSIS — N183 Chronic kidney disease, stage 3 unspecified: Secondary | ICD-10-CM

## 2021-04-27 DIAGNOSIS — Z794 Long term (current) use of insulin: Secondary | ICD-10-CM | POA: Diagnosis not present

## 2021-04-27 DIAGNOSIS — G8929 Other chronic pain: Secondary | ICD-10-CM | POA: Diagnosis not present

## 2021-04-27 MED ORDER — BASAGLAR KWIKPEN 100 UNIT/ML ~~LOC~~ SOPN: PEN_INJECTOR | SUBCUTANEOUS | 0 refills | Status: DC

## 2021-04-28 LAB — COMPLETE METABOLIC PANEL WITH GFR
AG Ratio: 1.4 (calc) (ref 1.0–2.5)
ALT: 18 U/L (ref 6–29)
AST: 26 U/L (ref 10–35)
Albumin: 4 g/dL (ref 3.6–5.1)
Alkaline phosphatase (APISO): 84 U/L (ref 37–153)
BUN/Creatinine Ratio: 19 (calc) (ref 6–22)
BUN: 21 mg/dL (ref 7–25)
CO2: 32 mmol/L (ref 20–32)
Calcium: 9.7 mg/dL (ref 8.6–10.4)
Chloride: 103 mmol/L (ref 98–110)
Creat: 1.12 mg/dL — ABNORMAL HIGH (ref 0.60–0.95)
Globulin: 2.9 g/dL (calc) (ref 1.9–3.7)
Glucose, Bld: 221 mg/dL — ABNORMAL HIGH (ref 65–99)
Potassium: 4.4 mmol/L (ref 3.5–5.3)
Sodium: 141 mmol/L (ref 135–146)
Total Bilirubin: 0.4 mg/dL (ref 0.2–1.2)
Total Protein: 6.9 g/dL (ref 6.1–8.1)
eGFR: 47 mL/min/{1.73_m2} — ABNORMAL LOW (ref >=60)

## 2021-04-28 LAB — CBC WITH DIFFERENTIAL/PLATELET
Absolute Monocytes: 700 cells/uL (ref 200–950)
Basophils Absolute: 52 cells/uL (ref 0–200)
Basophils Relative: 0.5 %
Eosinophils Absolute: 196 cells/uL (ref 15–500)
Eosinophils Relative: 1.9 %
HCT: 36.9 % (ref 35.0–45.0)
Hemoglobin: 12.1 g/dL (ref 11.7–15.5)
Lymphs Abs: 2225 cells/uL (ref 850–3900)
MCH: 31.3 pg (ref 27.0–33.0)
MCHC: 32.8 g/dL (ref 32.0–36.0)
MCV: 95.6 fL (ref 80.0–100.0)
MPV: 12.3 fL (ref 7.5–12.5)
Monocytes Relative: 6.8 %
Neutro Abs: 7128 cells/uL (ref 1500–7800)
Neutrophils Relative %: 69.2 %
Platelets: 262 10*3/uL (ref 140–400)
RBC: 3.86 10*6/uL (ref 3.80–5.10)
RDW: 11.9 % (ref 11.0–15.0)
Total Lymphocyte: 21.6 %
WBC: 10.3 10*3/uL (ref 3.8–10.8)

## 2021-04-28 LAB — LIPID PANEL
Cholesterol: 122 mg/dL (ref <200)
HDL: 48 mg/dL — ABNORMAL LOW (ref >=50)
LDL Cholesterol (Calc): 49 mg/dL (calc)
Non-HDL Cholesterol (Calc): 74 mg/dL (calc) (ref <130)
Total CHOL/HDL Ratio: 2.5 (calc) (ref <5.0)
Triglycerides: 177 mg/dL — ABNORMAL HIGH (ref <150)

## 2021-04-28 LAB — HEMOGLOBIN A1C
Hgb A1c MFr Bld: 7 % of total Hgb — ABNORMAL HIGH (ref <5.7)
Mean Plasma Glucose: 154 mg/dL
eAG (mmol/L): 8.5 mmol/L

## 2021-04-28 LAB — TSH: TSH: 0.8 mIU/L (ref 0.40–4.50)

## 2021-04-28 LAB — MAGNESIUM: Magnesium: 2.1 mg/dL (ref 1.5–2.5)

## 2021-04-30 NOTE — Progress Notes (Signed)
Pt is aware of her results and recommendations, and did not have any questions for provider or nurse

## 2021-05-03 ENCOUNTER — Other Ambulatory Visit: Payer: Self-pay

## 2021-05-03 ENCOUNTER — Ambulatory Visit: Payer: Medicare Other | Admitting: Pharmacist

## 2021-05-03 VITALS — BP 130/79 | HR 89

## 2021-05-03 DIAGNOSIS — Z9114 Patient's other noncompliance with medication regimen: Secondary | ICD-10-CM

## 2021-05-03 DIAGNOSIS — Z79899 Other long term (current) drug therapy: Secondary | ICD-10-CM

## 2021-05-03 DIAGNOSIS — E118 Type 2 diabetes mellitus with unspecified complications: Secondary | ICD-10-CM

## 2021-05-03 DIAGNOSIS — E1165 Type 2 diabetes mellitus with hyperglycemia: Secondary | ICD-10-CM

## 2021-05-03 DIAGNOSIS — E1122 Type 2 diabetes mellitus with diabetic chronic kidney disease: Secondary | ICD-10-CM

## 2021-05-03 DIAGNOSIS — F325 Major depressive disorder, single episode, in full remission: Secondary | ICD-10-CM

## 2021-05-03 DIAGNOSIS — Z794 Long term (current) use of insulin: Secondary | ICD-10-CM

## 2021-05-03 DIAGNOSIS — I10 Essential (primary) hypertension: Secondary | ICD-10-CM

## 2021-05-03 DIAGNOSIS — E559 Vitamin D deficiency, unspecified: Secondary | ICD-10-CM

## 2021-05-03 DIAGNOSIS — E1169 Type 2 diabetes mellitus with other specified complication: Secondary | ICD-10-CM

## 2021-05-03 NOTE — Progress Notes (Signed)
Pharmacist Visit  MALISSIE, MUSGRAVE [id number] 78 years, Female  DOB: December 08, 1929  M: [phone] Care Team: Rhys Martini, Kajol Crispen  __________________________________________________ Clinical Summary Situation:: Micca Matura is a pleasant 88 year old female who presents for initial CCM visit. She has no chief complaints and believes her health is improving significantly.  Background::  Blood pressure: Pt not checking BP at home regularly. Denies headache. Her BP today was 130/79 mmHg.  Blood sugar: Patient has had a significant A1c decrease from 13.9% (8/22) to 7% (04/27/21). Pt denies hypoglycemia except for 1 event before Christmas (49 mg/dL) which she corrected with fruit juice. Basaglar has been adjusted accordingly since. Pt needs a new script for test strips sent to [place].  Chronic Pain: Pt notes back pain which has been managed with 2 tablets for Tramadol 50mg  HS. She also has Rx for gabapentin and methocarbamol which she is not using (states it does not work)  Assessment::  BP: Controlled without medication. Denies falls or denies related to BP  Blood sugar: Now controlled. Pt is doing well on Trulicity and Basaglar with no side effects and recent lows  Chronic Pain: Controlled, pt may benefit from trying other non-narcotic medications for pain. Refer to pain managment  Recommendations::  BP: Educated patient to check BP at home daily. Will enroll patient in BP remote patient monitoring when available  Blood sugar: Encouraged patient to continue checking blood sugar daily and monitoring for lows. Patient needs more test strips, will request a new Rx sent in to [place]  Chronic Pain: Pt to follow with neurology/pain management for further management of chronic pain  Attestation Statement:: CCM Services:  This encounter meets complex CCM services and moderate to high medical decision making.  Prior to outreach and patient consent for Chronic  Care Management, I referred this patient for services after reviewing the nominated patient list or from a personal encounter with the patient.  I have personally reviewed this encounter including the documentation in this note and have collaborated with the care management provider regarding care management and care coordination activities to include development and update of the comprehensive care plan I am certifying that I agree with the content of this note and encounter as supervising physician.   Chronic Conditions [name] Chronic Conditions: Hypertension (HTN), Chronic Kidney Disease (CKD), Hyperlipidemia/Dyslipidemia (HLD), Diabetes (DM), Chronic Pain, Depression, Atherosclerosis, Vitamin D deficiency, Neuropathy  Disease Assessments Current BP: 140/68 Current HR: 78 taken on: 04/27/2021 Previous BP: 110/50 Previous HR: 62 taken on: 02/22/2021 Weight: 114 BMI: 20.03 Last GFR: 33 taken on: 02/22/2021 Why did the patient present?: CCM initial visit Marital status?: Married Details: Lenola Lockner Retired? 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 Does laundry, makes the bed. Take out the trash. Due to back pain she does not exercise like she used to. Goes with husband to grocery store. Church has a walking track and she wants to start going. She also goes out to eat with neighbors a few times a month. every Thursday they have dinner with their church Who does the patient spend their time with and what do they do?: Dominica Severin, son in [place]. Also has a son in [place] who comes by frequently and a son in [place] habits such as diet and exercise?: Diet: Breakfast cereal, bacon and eggs with toast. waffle, coffee daily Lunch: BLT, Dinner:  Chicken pan pie with veggies turnip greens, cabbage, cornbread Alcohol, tobacco, and illicit drug usage?: None What is the patient's  sleep pattern?: No sleep issues How many hours per night does patient typically sleep?: At least 8 hours. sleeps at 8pm and wakes up at 4 am Patient pleased with health care they are receiving?: Yes Family, occupational, and living circumstances relevant to overall health?: Her family history includes Diabetes in her brother and sister; Kidney cancer in her brother; Liver cancer in her sister; Liver disease in her sister; Lymphoma in her sister; Stroke in her brother. Factors that may affect medication adherence?: Pill burden Name and location of Current pharmacy: [facility] [place] - [address] Current Rx insurance plan: UHC Are meds synced by current pharmacy?: No Are meds delivered by current pharmacy?: No - delivery not available Would patient benefit from direct intervention of clinical lead in dispensing process to optimize clinical outcomes?: Yes Are [facility] pharmacy services available where patient lives?: Yes Is patient disadvantaged to use [facility]?: No [facility] services reviewed with patient and patient wishes to change pharmacy?: No Select reason patient declined to change pharmacies: Prefers to go to the pharmacy / reason to leave the house Does patient experience delays in picking up medications due to transportation concerns (getting to pharmacy)?: No Any additional demeanor/mood notes?: Towana Stenglein is a pleasant 88 year old female who presents for initial CCM visit. She has no chief complaints and believes her health is improving significantly.  Hypertension (HTN) Assess this condition today?: Yes Is patient able to obtain BP reading today?: Yes BP today is: 130/79 Heart Rate is: 89 Goal: <140/90 mmHG Hypertension Stage: Stage 1 (SBP: 130-139 or DBP: 80-89) Is Patient checking BP at home?: Yes Pt. home BP readings are ranging: Patient does not check BP at home How often does patient miss taking their blood pressure  medications?: Pt not currently taking BP meds Has patient experienced hypotension, dizziness, falls or bradycardia?: Yes Provide Details: Pt is persistently dizzy but manages with cane and slow movements when switching positions Check present secondary causes (below) for HTN: Obesity, CKD BP RPM device: No We discussed: DASH diet:  following a diet emphasizing fruits and vegetables and low-fat dairy products along with whole grains, fish, poultry, and nuts. Reducing red meats and sugars., Reducing the amount of salt intake to 1500mg /per day., Proper Home BP Measurement, Other (provide details below) Details: Check BP daily Assessment:: Controlled Drug: None HC Follow up: N/A Pharmacist Follow up: Continue to monitor blood pressure in office and at home. Consider pt for RPM if available  Hyperlipidemia/Dyslipidemia (HLD) Last Lipid panel on: 04/27/2021 TC (Goal<200): 122 LDL: 49 HDL (Goal>40): 48 TG (Goal<150): 177 ASCVD 10-year risk?is:: N/A due to Age > 79 Assess this condition today?: Yes LDL Goal: <100 Has patient tried and failed any HLD Medications?: No Check present secondary causes (below) that can lead to increased cholesterol levels (multi-choice optional): CKD, Diabetes We discussed: How to reduce cholesterol through diet/weight management and physical activity., Encouraged increasing fiber to a daily intake of 10-25g/day Assessment:: Controlled Drug: Rosuvastatin 20mg  QD Assessment: Appropriate, Effective, Safe, Accessible Additional Info: TSH WNL HC Follow up: N/A Pharmacist Follow up: Monitor lipid panel, LFTs, TSH, s/s rhabdo  Diabetes (DM) Current A1C: 7.0 taken on: 04/27/2021 Previous A1C: 13.9 taken on: 12/02/2020 Type: 2 The current microalbumin ratio is: 79 tested on: 12/02/2020 Last DM Foot Exam on: 12/10/2013 Results: Abnormal Last DM Eye Exam on:  09/23/2020 Results: Normal Results: Unknown Assess this condition today?: Yes Goal A1C: < 7.0 % Type:  2 Is Patient taking statin medication?: No Reason patient is not taking statin(s): Age Is patient taking ACEi / ARB?: No DM RPM device: Does patient qualify?: Yes Patient checking Blood Sugar at home?: Yes Does patient use a Continuous Glucose Monitoring (CGM) device?: No How often does patient check Blood Sugars?: Daily Recent fasting Blood sugar reading: 98, 101, 87, 100 Has patient experienced any hypoglycemic episodes?: Yes Details: 49 before Christmas. She corrected with fruit juice and has not had a low since Diet recall discussed: No We discussed: How to recognize and treat signs of hypoglycemia., Low carbohydrate eating plan with an emphasis on whole grains, legumes, nuts, fruits, and vegetables and minimal refined and processed foods. Assessment:: Controlled Drug: Trulicity 1.5 mg Qweek Assessment: Appropriate, Effective, Safe, Accessible Drug: Basaglar 14-16 units under the skin daily Assessment: Appropriate, Effective, Safe, Accessible HC Follow up: N/A Pharmacist Follow up: Follow A1c, FBG, monitor s/s nausea vomiting  Depression Assess this condition today?: Yes Completing the PHQ-9 Questionnaire today?: Yes PHQ-9: Over the last 2 weeks, how often have you been bothered by the following problems?: Done Little interest or pleasure in doing things: 1 (several days) Feeling down, depressed, or hopeless: 1 (several days) Trouble falling or staying asleep, or sleeping too much: 0 (Not at all) Feeling tired or having little energy: 1 (several days) Poor appetite or overeating: 1 (several days) Feeling bad about yourself  or that you are a failure or have let yourself or your family down: 0 (Not at all) Trouble concentrating on things, such as reading the newspaper or watching television: 0 (Not at all) Moving or speaking so slowly that other people could have noticed? Or the opposite - being so fidgety or restless that you have been moving around a to more than usual: 0 (Not at  all) Thoughts that you would be better off dead or of hurting yourself in some way: 0 (Not at all) Total PHQ-9 Score (please total responses for questions  above): 4 Depression Severity: Minimal / none (Score: 0-4) In your opinion, how do you feel your depression symptoms have been controlled over the past 3 months?: Stable / stayed the same Assessment:: Controlled Drug: Bupropion XL 150mg  QD Assessment: Appropriate, Effective, Safe, Accessible Additional Info: Pt declines CBT at this time [facility] Follow up: N/A Pharmacist Follow up: Continue to assess mood,  Chronic kidney disease (CKD) Previous GFR: 47 taken on: 04/27/2021 The current microalbumin ratio is: 79 tested on: 08/29/2019 Assess this condition today?: Yes CKD Stage: Stage 3a (GFR 45-60 mL/min) Albuminuria Stage: A2 (30-300) Contributing factors for developing CKD: Diabetes, HTN Is Patient taking statin medication: Yes Is patient taking ACEi / ARB?: No Reason patient is not taking ACEi / ARB: BP controlled Renal dose adjustments recommended?: No We discussed: Limiting dietary sodium intake to less than 2000 mg / day, Maintaining blood pressure control, Maintaining blood glucose control, Avoidance of nephrotoxic drugs (NSAIDs) Assessment:: Controlled Drug: None HC Follow up: N/A Pharmacist Follow up: Continue to monitor GFR, SCr, adjust meds as necessary  Chronic Pain Assess this condition today?: Yes Where is the pain primarily located?: Lower back What makes your pain better?: Tramadol, Methocarbamol has not helped her much with pain Any issues with constipation related to your medications?: No Patient has tried and failed: Pt has been needing 2 tablets of tramadol to help control pain, she wishes she did not have to take  tramadol daily due to risk of addiction Assessment:: Controlled Drug: Tramadol 50mg  take 2 tablets HS Assessment: Appropriate, Effective, Safe, Accessible Drug: Gabapentin 300mg  TID PRN (Pt not really  taking) Assessment: Appropriate, Effective, Safe, Accessible Drug: Methocarbamol 500mg  QID PRN (Pt not taking) Assessment: Query need HC Follow up: N/A Pharmacist Follow up: Pt to continue to follow with pain management for other non-narcotic treatment options  Vitamin D deficiency  Pertinent Labs: Vit D 25OH (12/03/20): 21 Assessment:: Controlled Drug: Vitamin D3 2000 IU BID Assessment: Appropriate, Effective, Safe, Accessible Plan to Start: Please restart Vitamin D3 2000 IU 2 times a day to help keep vitamin D level between 70-100 HC Follow up: N/A Pharmacist Follow up: Continue to monitor Vit D labs  Exercise, Diet and Non-Drug Coordination Needs Additional exercise counseling points. We discussed: incorporating flexibility, balance, and strength training exercises, decreasing sedentary behavior Additional diet counseling points. We discussed: key components of the DASH diet, key components of a low-carb eating plan Discussed Non-Drug Care Coordination Needs: Yes Does Patient have Medication financial barriers?: No  Accountable Health Communities Health-Related Social Needs Screening Tool -  SDOH  ([email or web address]) What is your living situation today? (ref #1): I have a steady place to live Think about the place you live. Do you have problems with any of the following? (ref #2): None of the above Within the past 12 months, you worried that your food would run out before you got money to buy more (ref #3): Never true Within the past 12 months, the food you bought just didn't last and you didn't have money to get more (ref #4): Never true In the past 12 months, has lack of reliable transportation kept you from medical appointments, meetings, work or from getting things needed for daily living? (ref #5): No In the past 12 months, has the electric, gas, oil, or water company threatened to shut off services in your home? (ref #6):  No How often does anyone, including family and friends, physically hurt you? (ref #7): Never (1) How often does anyone, including family and friends, insult or talk down to you? (ref #8): Never (1) How often does anyone, including friends and family, threaten you with harm? (ref #9): Never (1) How often does anyone, including family and friends, scream or curse at you? (ref #10): Never (1).  Engagement Notes Newton Pigg on 05/03/2021 10:40 AM CPP Chart Review: 26 min CPP Office Visit: 2 min CPP Office Visit Documentation: 61 min CPP Coordination of Care: 5 min [facility] Care Plan Completion: 20 min CPP Care Plan Review:15 min  Engagement Notes Newton Pigg on 05/03/2021 04:53 PM HC F/u: N/A CPP F/u:  08/04/21: Ok [facility] 12/07/21: Ok [facility] 12/23/21: Ok [facility] w [facility] 01/04/22: CCM follow up visit in office (HTN, cholesterol, A1c check for hypoglycemia.)   Care Gaps:  Shingrix: Pt will receive at [place]: Pt educated Tdap: Pt educated     Patient Name: Lashonta, Pilling DOB:  1929/05/08   Last Care Plan Update: 05/03/2021 COMPREHENSIVE CARE PLAN AND GOALS   HYPERTENSION  MOST RECENT BLOOD PRESSURE:     130/79 MY GOAL BLOOD PRESSURE:  <140/90 CURRENT MEDICATION AND DOSING:  None THE GOALS WE HAVE CHOSEN ARE:    -Maintain an at goal blood pressure  BARRIERS TO ACHIEVING GOALS:  -At goal PLAN TO WORK ON THESE GOALS:  -Take medications regularly   -Check BP at home  -Reduce salt intake (< 1500mg / day)  -Diet: DASH diet (Choose fruits, vegetables, and low-fat  dairy products. Increase whole grains, fish, poultry, nuts. Reduce red meats and sugars)  -Weight: 1 kg = ~1 mmHg reduction  -Exercise  CHOLESTEROL  MOST RECENT LABS:    04/27/2021 -TOTAL CHOLESTEROL: 122  -TRIGLYCERIDES: 177 -HDL: 48 -LDL: 49 CURRENT MEDICATION AND DOSING:  Rosuvastatin 20mg  once daily THE GOALS WE HAVE CHOSEN ARE:    -Total Cholesterol goal under 200, Triglycerides goal under 150, HDL  goal above 40, LDL goal under 100  BARRIERS TO ACHIEVING GOALS:  -At goal PLAN TO WORK ON THESE GOALS:  -Take medication regularly  -Diet: high in plant sterols (fruits/ vegetables/ nuts/ whole grains/ legumes). Increase fiber intake (10-25g/day). Avoid foods high in cholesterol (red meat, egg yolks, dairy, oils/ butter). Choose low-fat options.  -Exercise  -Weight Management   DIABETES  MOST RECENT A1C:   7.0 on 04/27/2021 MY GOAL A1C:  <7.0% LAST FOOT EXAM: 12/02/2020 LAST EYE EXAM: 09/23/2020 CURRENT MEDICATION AND DOSING: Trulicity 1.5 mg once weekly, Basaglar 14-16 units under the skin daily THE GOALS WE HAVE CHOSEN ARE:   -Maintain an at goal A1C level  BARRIERS TO ACHIEVING GOALS:  -At goal PLAN TO WORK ON THESE GOALS:  -Take medications as prescribed  -Check blood glucose daily  -Diet: natural carbs and sugars (vegetables, fruits, whole grains, legumes, dairy), avoid alcohol, reduce carbohydrate intake and processed sugars. Maintain a low carbohydrate diet, eating 45 grams of carbohydrates per meal (3 servings of carbohydrates per meal), 15 grams of carbohydrates per snack (1 serving of carbohydrate per snack)  -Weight Loss: waist circumference goal < 35 inches for females; < 40 inches for males  -Exercise: 150 minutes of moderate-intensity aerobic activity per week (at least 3 days)   -Discussed Technique: testing / injection as applicable  -Foot care: wash, dry examine feet daily. Moisturize the top and bottom (not in between). Trim nails with nail file (no sharp edges). Wear socks and shoes. Elevate feet when sitting. Annual foot exam by podiatrist.   -Recognize sign and symptoms of hypoglycemia and understand treatment as outlined below  -Hypoglycemia sign and symptoms: dizziness, anxiety/ irritability, shakiness, headache, diaphoresis, hunger, confusion, nausea, ataxia, tremors, palpitations/ tachycardia, blurred vision  -Hypoglycemia Treatment: Rule of 15:   (1)  15-20 grams of glucose/ simple carb (4 oz juice, 8oz milk, 4oz regular soda, 1 tablespoon sugar/ honey/ corn syrup, 3-4 glucose tabs/ 1 serving glucose gel)  (2) Recheck BG after 15 mins. If hypoglycemia continues  (3) Repeat steps 1&2, when BG normalizes, eat small meal or snack       Depression  CURRENT REGIMEN AND DOSING:  Bupropion XL 150mg  once daily THE GOALS WE HAVE CHOSEN ARE:  -Lower and manage symptoms of depression  PLAN TO WORK ON THESE GOALS:    -Continue medication as prescribed  -Inform practitioner of any signs and symptoms of worsening depression  -Symptoms: persistent feeling of hopelessness, dejection, constant worry, poor concentration, lack of energy, inability to sleep, sometimes suicidal tendencies, agitation, decreased concentration, sleep changes, diminished interest/ pleasure, changes in appetite  Chronic Kidney Disease  Labs:  GFR 47 on 04/27/2021   Microalbumin ratio 79 on 08/29/2019 PLAN TO WORK ON THESE GOALS:    -Maintaining normal BP and BG  -Diet: Decrease salt and fatty foods. Increase fruit and vegetables               -Maintaining blood glucose control               - Avoidance of nephrotoxic drugs (NSAIDs)  Vitamin D deficiency  CURRENT REGIMEN AND DOSING:  Vitamin D3 2000 IU twice daily THE GOALS WE HAVE CHOSEN ARE:  -Limit dietary sodium intake to less than 2000mg /day -Maintain blood pressure control -Avoid nephrotoxic drugs (NSAIDS) BARRIERS TO ACHIEVING GOALS:  At goal PLAN TO WORK ON THESE GOALS:    Continue to monitor GFR, SCr, adjust medications as necessary  [place] current medication list has been updated. To view, log in to your patient portal.  Call if any changes need to be made.   MEDICATION REVIEW  MEDICATION REVIEW CONDUCTED:   Yes   DATE:    05/03/2021 BEST POSSIBLE MEDICATION HISTORY  SOURCE:   Medical Records     HOW DO I? - WHEN DO I?   GET AHOLD OF MY DOCTOR?   DURING BUSINESS HOURS WHEN  THE OFFICE IS OPEN    PHONE: [phone] AFTER HOURS [facility] NURSE WHEN THE OFFICE IS CLOSED  PHONE: [phone]  TALK TO [place] CARE COORDINATOR NAME: Newton Pigg  PHONE: [phone] EMAIL:  [email or web address] CARE COORDINATOR STAFF   NAME: Patria Warzecha, [facility]   Newton Pigg, CPP  PHONE: [phone]  NOTE SECTION  Thank you for participating in the Chronic Care Management (CCM) program with Dr. Newton Pigg   This program takes a proactive approach to your health and my team will serve as a resource for you throughout the year. Please follow up at [phone] if you have any questions or experience changes to your overall health. Your next CCM appointment will be conducted with Newton Pigg, PharmD as follows:    Date:  01/04/2022  Time: 11:00am In [place]. Jeannett Senior, PharmD  Clinical Pharmacist  [email or web address]  [phone]

## 2021-05-04 ENCOUNTER — Other Ambulatory Visit: Payer: Self-pay | Admitting: Adult Health

## 2021-05-04 MED ORDER — ONETOUCH VERIO VI STRP: ORAL_STRIP | 3 refills | Status: DC

## 2021-05-14 DIAGNOSIS — M5416 Radiculopathy, lumbar region: Secondary | ICD-10-CM | POA: Diagnosis not present

## 2021-05-17 ENCOUNTER — Other Ambulatory Visit: Payer: Self-pay | Admitting: Adult Health

## 2021-05-17 DIAGNOSIS — Z794 Long term (current) use of insulin: Secondary | ICD-10-CM

## 2021-05-17 DIAGNOSIS — E118 Type 2 diabetes mellitus with unspecified complications: Secondary | ICD-10-CM

## 2021-05-21 DIAGNOSIS — M5416 Radiculopathy, lumbar region: Secondary | ICD-10-CM | POA: Diagnosis not present

## 2021-05-24 DIAGNOSIS — E118 Type 2 diabetes mellitus with unspecified complications: Secondary | ICD-10-CM | POA: Diagnosis not present

## 2021-05-24 DIAGNOSIS — E1122 Type 2 diabetes mellitus with diabetic chronic kidney disease: Secondary | ICD-10-CM | POA: Diagnosis not present

## 2021-05-24 DIAGNOSIS — I10 Essential (primary) hypertension: Secondary | ICD-10-CM | POA: Diagnosis not present

## 2021-05-24 DIAGNOSIS — E1169 Type 2 diabetes mellitus with other specified complication: Secondary | ICD-10-CM | POA: Diagnosis not present

## 2021-05-26 ENCOUNTER — Encounter (INDEPENDENT_AMBULATORY_CARE_PROVIDER_SITE_OTHER): Payer: Medicare Other | Admitting: Ophthalmology

## 2021-05-27 ENCOUNTER — Other Ambulatory Visit: Payer: Self-pay | Admitting: Adult Health

## 2021-05-27 DIAGNOSIS — E782 Mixed hyperlipidemia: Secondary | ICD-10-CM

## 2021-06-15 DIAGNOSIS — M5442 Lumbago with sciatica, left side: Secondary | ICD-10-CM | POA: Diagnosis not present

## 2021-06-15 DIAGNOSIS — G8929 Other chronic pain: Secondary | ICD-10-CM | POA: Diagnosis not present

## 2021-06-15 DIAGNOSIS — Z981 Arthrodesis status: Secondary | ICD-10-CM | POA: Diagnosis not present

## 2021-06-22 ENCOUNTER — Other Ambulatory Visit: Payer: Self-pay

## 2021-06-22 ENCOUNTER — Encounter: Payer: Self-pay | Admitting: Nurse Practitioner

## 2021-06-22 ENCOUNTER — Ambulatory Visit (INDEPENDENT_AMBULATORY_CARE_PROVIDER_SITE_OTHER): Payer: Medicare Other | Admitting: Nurse Practitioner

## 2021-06-22 VITALS — BP 144/62 | HR 87 | Temp 97.9°F | Wt 109.6 lb

## 2021-06-22 DIAGNOSIS — G8929 Other chronic pain: Secondary | ICD-10-CM

## 2021-06-22 DIAGNOSIS — J309 Allergic rhinitis, unspecified: Secondary | ICD-10-CM

## 2021-06-22 DIAGNOSIS — Z681 Body mass index (BMI) 19 or less, adult: Secondary | ICD-10-CM

## 2021-06-22 DIAGNOSIS — Z1152 Encounter for screening for COVID-19: Secondary | ICD-10-CM | POA: Diagnosis not present

## 2021-06-22 DIAGNOSIS — M25552 Pain in left hip: Secondary | ICD-10-CM | POA: Diagnosis not present

## 2021-06-22 DIAGNOSIS — M25551 Pain in right hip: Secondary | ICD-10-CM

## 2021-06-22 LAB — POC COVID19 BINAXNOW: SARS Coronavirus 2 Ag: NEGATIVE

## 2021-06-22 MED ORDER — IPRATROPIUM BROMIDE 0.03 % NA SOLN: 2.0000 | Freq: Three times a day (TID) | NASAL | 2 refills | Status: DC

## 2021-06-22 MED ORDER — DEXAMETHASONE 1 MG PO TABS: ORAL_TABLET | ORAL | 0 refills | Status: DC

## 2021-06-22 NOTE — Patient Instructions (Addendum)
Generic Allegra daily Small steroid course- blood sugars may increase, monitor closely Nasal spray daily for a week  Allergic Rhinitis, Adult Allergic rhinitis is an allergic reaction that affects the mucous membrane inside the nose. The mucous membrane is the tissue that produces mucus. There are two types of allergic rhinitis: Seasonal. This type is also called hay fever and happens only during certain seasons. Perennial. This type can happen at any time of the year. Allergic rhinitis cannot be spread from person to person. This condition can be mild, moderate, or severe. It can develop at any age and may be outgrown. What are the causes? This condition is caused by allergens. These are things that can cause an allergic reaction. Allergens may differ for seasonal allergic rhinitis and perennial allergic rhinitis. Seasonal allergic rhinitis is triggered by pollen. Pollen can come from grasses, trees, and weeds. Perennial allergic rhinitis may be triggered by: Dust mites. Proteins in a pet's urine, saliva, or dander. Dander is dead skin cells from a pet. Smoke, mold, or car fumes. What increases the risk? You are more likely to develop this condition if you have a family history of allergies or other conditions related to allergies, including: Allergic conjunctivitis. This is inflammation of parts of the eyes and eyelids. Asthma. This condition affects the lungs and makes it hard to breathe. Atopic dermatitis or eczema. This is long term (chronic) inflammation of the skin. Food allergies. What are the signs or symptoms? Symptoms of this condition include: Sneezing or coughing. A stuffy nose (nasal congestion), itchy nose, or nasal discharge. Itchy eyes and tearing of the eyes. A feeling of mucus dripping down the back of your throat (postnasal drip). Trouble sleeping. Tiredness or fatigue. Headache. Sore throat. How is this diagnosed? This condition may be diagnosed with your  symptoms, medical history, and physical exam. Your health care provider may check for related conditions, such as: Asthma. Pink eye. This is eye inflammation caused by infection (conjunctivitis). Ear infection. Upper respiratory infection. This is an infection in the nose, throat, or upper airways. You may also have tests to find out which allergens trigger your symptoms. These may include skin tests or blood tests. How is this treated? There is no cure for this condition, but treatment can help control symptoms. Treatment may include: Taking medicines that block allergy symptoms, such as corticosteroids and antihistamines. Medicine may be given as a shot, nasal spray, or pill. Avoiding any allergens. Being exposed again and again to tiny amounts of allergens to help you build a defense against allergens (immunotherapy). This is done if other treatments have not helped. It may include: Allergy shots. These are injected medicines that have small amounts of allergen in them. Sublingual immunotherapy. This involves taking small doses of a medicine with allergen in it under your tongue. If these treatments do not work, your health care provider may prescribe newer, stronger medicines. Follow these instructions at home: Avoiding allergens Find out what you are allergic to and avoid those allergens. These are some things you can do to help avoid allergens: If you have perennial allergies: Replace carpet with wood, tile, or vinyl flooring. Carpet can trap dander and dust. Do not smoke. Do not allow smoking in your home. Change your heating and air conditioning filters at least once a month. If you have seasonal allergies, take these steps during allergy season: Keep windows closed as much as possible. Plan outdoor activities when pollen counts are lowest. Check pollen counts before you plan outdoor activities.  When coming indoors, change clothing and shower before sitting on furniture or  bedding. If you have a pet in the house that produces allergens: Keep the pet out of the bedroom. Vacuum, sweep, and dust regularly. General instructions Take over-the-counter and prescription medicines only as told by your health care provider. Drink enough fluid to keep your urine pale yellow. Keep all follow-up visits as told by your health care provider. This is important. Where to find more information American Academy of Allergy, Asthma & Immunology: www.aaaai.org Contact a health care provider if: You have a fever. You develop a cough that does not go away. You make whistling sounds when you breathe (wheeze). Your symptoms slow you down or stop you from doing your normal activities each day. Get help right away if: You have shortness of breath. This symptom may represent a serious problem that is an emergency. Do not wait to see if the symptom will go away. Get medical help right away. Call your local emergency services (911 in the [place] Do not drive yourself to the hospital. Summary Allergic rhinitis may be managed by taking medicines as directed and avoiding allergens. If you have seasonal allergies, keep windows closed as much as possible during allergy season. Contact your health care provider if you develop a fever or a cough that does not go away. This information is not intended to replace advice given to you by your health care provider. Make sure you discuss any questions you have with your health care provider. Document Revised: 05/31/2019 Document Reviewed: 04/09/2019 Elsevier Patient Education  2022 [employer].

## 2021-06-22 NOTE — Progress Notes (Signed)
Assessment and Plan:   [name] was seen today for acute visit.  Diagnoses and all orders for this visit:  Encounter for screening for COVID-19 -     POC COVID-19 Negative  Allergic rhinitis, unspecified seasonality, unspecified trigger Use Allegra daily, mucinex as needed, push fluids Check blood sugar closely while on steroids If symptoms worsen or develops fever notify the office -     dexamethasone (DECADRON) 1 MG tablet; Take 3 tabs for 3 days, 2 tabs for 3 days 1 tab for 5 days. Take with food. -     ipratropium (ATROVENT) 0.03 % nasal spray; Place 2 sprays into the nose 3 (three) times daily.  Chronic pain of both hips Has appointment with pain clinic 3/22 to discuss spine stimulator for pain management  BMI 19 Encourage more frequent small meals, high caloric/protein rich foods Monitor weight  Further disposition pending results of labs. Discussed med's effects and SE's.   Over 30 minutes of exam, counseling, chart review, and critical decision making was performed.   Future Appointments  Date Time Provider [place]  06/24/2021 11:00 AM Liane Comber, NP [facility] None  08/04/2021  2:30 PM [name], MD [facility] None  12/07/2021  2:00 PM Liane Comber, NP [facility] None  12/23/2021 11:00 AM Liane Comber, NP [facility] None  01/04/2022 11:00 AM Newton Pigg, RPH [facility] None    ------------------------------------------------------------------------------------------------------------------   HPI BP (!) 144/62    Pulse 87    Temp 97.9 F (36.6 C)    Wt 109 lb 9.6 oz (49.7 kg)    SpO2 96%    BMI 19.26 kg/m   88 y.o.female presents for Cough with occasional clear phlegm production.  She has some sinus congestion, fatigue and some body aches.  Denies nausea, vomiting, diarrhea.   She does have an appointment with pain clinic for possible spine stimulator on 3/22 for back pain.   BMI is Body mass index is 19.26 kg/m., she has not been  working on diet and exercise. Wt Readings from Last 3 Encounters:  06/22/21 109 lb 9.6 oz (49.7 kg)  04/27/21 114 lb (51.7 kg)  02/23/21 114 lb (51.7 kg)   She has ben checking blood sugar and has been running 80-100 Lab Results  Component Value Date   HGBA1C 7.0 (H) 04/27/2021    Past Medical History:  Diagnosis Date   Arthritis    Chronic back pain    Cystoid macular edema of right eye 08/19/2019   Diabetic neuropathy (HCC)    Exudative age-related macular degeneration of left eye with inactive choroidal neovascularization ([place]) 11/18/2019   Hypercholesteremia    Hypertension    Liver lesion    Neuropathy    Type II or unspecified type diabetes mellitus without mention of complication, not stated as uncontrolled    Vitamin D deficiency    Wears glasses      Allergies  Allergen Reactions   Morphine And Related Other (See Comments)    fuzzy feeling    Current Outpatient Medications on File Prior to Visit  Medication Sig   aspirin 81 MG tablet Take 81 mg by mouth daily.   bisoprolol-hydrochlorothiazide (ZIAC) 2.5-6.25 MG tablet Take 1 tablet by mouth daily.   buPROPion (WELLBUTRIN XL) 150 MG 24 hr tablet Take 1 tablet every Morning for Mood , Focus & Concentration   Cholecalciferol (VITAMIN D3) 2000 UNITS TABS Take 1 capsule by mouth 2 (two) times daily.   dorzolamide-timolol (COSOPT) 22.3-6.8 MG/ML ophthalmic solution Place 1 drop into  both eyes 2 (two) times daily.   Dulaglutide (TRULICITY) 1.5 BP/1.0CH SOPN INJECT 0.5ML (1.5MG) SUBCUTANEOUSLY ONCE WEEKLY   Insulin Glargine (BASAGLAR KWIKPEN) 100 UNIT/ML DIAL AND INJECT 30 UNITS UNDER THE SKIN ONCE DAILY AS DIRECTED   latanoprost (XALATAN) 0.005 % ophthalmic solution Place 1 drop into both eyes at bedtime.   Melatonin 10 MG TABS Take 1 tablet by mouth daily.   methocarbamol (ROBAXIN) 500 MG tablet Take 500 mg by mouth 4 (four) times daily.   Multiple Vitamins-Minerals (CENTRUM SILVER 50+WOMEN) TABS Take 1 tablet by mouth  daily.   Multiple Vitamins-Minerals (ICAPS AREDS 2 PO) Take 1 capsule by mouth daily.   rOPINIRole (REQUIP) 3 MG tablet Take 1 to 2 tablets  at Bedtime  for Restless Legs                                                               /                     TAKE  BY MOUTH   rosuvastatin (CRESTOR) 20 MG tablet TAKE 1 TABLET BY MOUTH ONCE DAILY FOR CHOLESTEROL   senna-docusate (SENOKOT-S) 8.6-50 MG tablet Take 1 tablet by mouth as needed.   traMADol (ULTRAM) 50 MG tablet Take 50 mg by mouth every 6 (six) hours as needed for moderate pain. Pt only takes 2 pills per night   vitamin B-12 (CYANOCOBALAMIN) 1000 MCG tablet Take 1,000 mcg by mouth daily.   vitamin C (ASCORBIC ACID) 500 MG tablet Take 500 mg by mouth daily.   zinc gluconate 50 MG tablet Take 50 mg by mouth daily.   AquaLance Lancets 30G MISC CHECK BLOOD SUGAR 3 TIMES A DAY   Blood Glucose Calibration (ACCU-CHEK AVIVA) SOLN Check blood sugar 3 times daily-DX-E11.22   Blood Glucose Monitoring Suppl (ONETOUCH VERIO FLEX SYSTEM) w/Device KIT Check blood sugar 3 times daily-E11.8   COMFORT EZ PEN NEEDLES 32G X 4 MM MISC USE 2 TIMES DAILY AS DIRECTED , 1 PEN NEEDLE PER INJECTION.   Continuous Blood Gluc Sensor (FREESTYLE LIBRE SENSOR SYSTEM) MISC Check blood sugar 3 times a day. Dx:E11.8, Z79.4   Continuous Blood Gluc Sensor MISC Check blood sugar 3 times a day- Dx: E11.8/Z79.4   EASY COMFORT INSULIN SYRINGE 30G X 1/2" 0.5 ML MISC    gabapentin (NEURONTIN) 300 MG capsule Take 300 mg by mouth 3 (three) times daily.   glucose blood (ONETOUCH VERIO) test strip USE 1 STRIP TO CHECK GLUCOSE THREE TIMES DAILY AS DIRECTED   hyoscyamine (LEVSIN SL) 0.125 MG SL tablet Take 1 to 2 tablets 3 to 4 x day if needed for Nausea, vomiting, cramping or diarrhea (Patient not taking: Reported on 06/22/2021)   lactulose (CHRONULAC) 10 GM/15ML solution Take 15 ml (3 tsp) once or twice daily as needed for constipation. (Patient not taking: Reported on 05/03/2021)   Lancet  Devices (ADJUSTABLE LANCING DEVICE) MISC CHECK BLOOD SUGAR 3 TIMES A DAY   Magnesium 400 MG TABS Take 1 tablet by mouth daily. (Patient not taking: Reported on 05/03/2021)   No current facility-administered medications on file prior to visit.    ROS: all negative except above.   Physical Exam:  BP (!) 144/62    Pulse 87    Temp 97.9  F (36.6 C)    Wt 109 lb 9.6 oz (49.7 kg)    SpO2 96%    BMI 19.26 kg/m   General Appearance: Thin, frail pleasant female, in no apparent distress. Eyes: PERRLA, EOMs, conjunctiva no swelling or erythema Sinuses: No Frontal/maxillary tenderness ENT/Mouth: Ext aud canals clear, TMs without erythema, bulging. No erythema, swelling, or exudate on post pharynx.  Tonsils not swollen or erythematous. Hearing normal.  Neck: Supple, thyroid normal.  Respiratory: Respiratory effort normal, BS equal bilaterally without rales, rhonchi, wheezing or stridor.  Cardio: RRR with no MRGs. Brisk peripheral pulses without edema.  Abdomen: Soft, + BS.  Non tender, no guarding, rebound, hernias, masses. Lymphatics: Non tender without lymphadenopathy.  Musculoskeletal: Full ROM, 5/5 strength, normal gait.  Skin: Warm, dry without rashes, lesions, ecchymosis.  Neuro: Cranial nerves intact. Normal muscle tone, no cerebellar symptoms. Sensation intact.  Psych: Awake and oriented X 3, normal affect, Insight and Judgment appropriate.     Magda Bernheim, NP 9:56 AM [name]

## 2021-06-24 ENCOUNTER — Ambulatory Visit: Payer: Medicare Other | Admitting: Adult Health

## 2021-07-08 ENCOUNTER — Encounter (INDEPENDENT_AMBULATORY_CARE_PROVIDER_SITE_OTHER): Payer: Self-pay | Admitting: Ophthalmology

## 2021-07-08 ENCOUNTER — Ambulatory Visit (INDEPENDENT_AMBULATORY_CARE_PROVIDER_SITE_OTHER): Payer: Medicare Other | Admitting: Ophthalmology

## 2021-07-08 ENCOUNTER — Other Ambulatory Visit: Payer: Self-pay

## 2021-07-08 DIAGNOSIS — H353221 Exudative age-related macular degeneration, left eye, with active choroidal neovascularization: Secondary | ICD-10-CM | POA: Diagnosis not present

## 2021-07-08 DIAGNOSIS — E1165 Type 2 diabetes mellitus with hyperglycemia: Secondary | ICD-10-CM

## 2021-07-08 DIAGNOSIS — H353222 Exudative age-related macular degeneration, left eye, with inactive choroidal neovascularization: Secondary | ICD-10-CM | POA: Diagnosis not present

## 2021-07-08 DIAGNOSIS — H353211 Exudative age-related macular degeneration, right eye, with active choroidal neovascularization: Secondary | ICD-10-CM

## 2021-07-08 DIAGNOSIS — H353124 Nonexudative age-related macular degeneration, left eye, advanced atrophic with subfoveal involvement: Secondary | ICD-10-CM

## 2021-07-08 DIAGNOSIS — H353114 Nonexudative age-related macular degeneration, right eye, advanced atrophic with subfoveal involvement: Secondary | ICD-10-CM

## 2021-07-08 MED ORDER — BEVACIZUMAB 2.5 MG/0.1ML IZ SOSY
2.5000 mg | PREFILLED_SYRINGE | INTRAVITREAL | Status: AC | PRN
  Administered 2021-07-08: 2.5 mg via INTRAVITREAL

## 2021-07-08 NOTE — Assessment & Plan Note (Signed)
Stabilized OS and maintained disease resolution at 23-monthfollow-up today ?

## 2021-07-08 NOTE — Assessment & Plan Note (Signed)
Atrophy and scarring accounts for acuity ?

## 2021-07-08 NOTE — Progress Notes (Signed)
? ? ?07/08/2021 ? ?  ? ?CHIEF COMPLAINT ?Patient presents for  ?Chief Complaint  ?Patient presents with  ? Macular Degeneration  ? ? ? ? ?HISTORY OF PRESENT ILLNESS: ?[name] is a 88 y.o. female who presents to the clinic today for:  ? ?HPI   ?3 mos for dilate ou avastin od and os (r/s  from feb). ?Pt states little vision changes. Pt states she does not she think she sees as well.  ?Pt denies floaters and FOL. ?Pt is taking DORZ-timolol 2x a day ?Pt is still taking latanoprost 2x a day ? ? ? ? ?Last edited by Silvestre Moment on 07/08/2021 10:35 AM.  ?  ? ? ?Referring physician: ?[name], MD ?[address] ?[address] ?[place],  [place] [address] ? ?HISTORICAL INFORMATION:  ? ?Selected notes from the [facility] ?  ? ?Lab Results  ?Component Value Date  ? HGBA1C 7.0 (H) 04/27/2021  ?  ? ?CURRENT MEDICATIONS: ?Current Outpatient Medications (Ophthalmic Drugs)  ?Medication Sig  ? dorzolamide-timolol (COSOPT) 22.3-6.8 MG/ML ophthalmic solution Place 1 drop into both eyes 2 (two) times daily.  ? latanoprost (XALATAN) 0.005 % ophthalmic solution Place 1 drop into both eyes at bedtime.  ? ?No current facility-administered medications for this visit. (Ophthalmic Drugs)  ? ?Current Outpatient Medications (Other)  ?Medication Sig  ? AquaLance Lancets 30G MISC CHECK BLOOD SUGAR 3 TIMES A DAY  ? aspirin 81 MG tablet Take 81 mg by mouth daily.  ? bisoprolol-hydrochlorothiazide (ZIAC) 2.5-6.25 MG tablet Take 1 tablet by mouth daily.  ? Blood Glucose Calibration (ACCU-CHEK AVIVA) SOLN Check blood sugar 3 times daily-DX-E11.22  ? Blood Glucose Monitoring Suppl (ONETOUCH VERIO FLEX SYSTEM) w/Device KIT Check blood sugar 3 times daily-E11.8  ? buPROPion (WELLBUTRIN XL) 150 MG 24 hr tablet Take 1 tablet every Morning for Mood , Focus & Concentration  ? Cholecalciferol (VITAMIN D3) 2000 UNITS TABS Take 1 capsule by mouth 2 (two) times daily.  ? COMFORT EZ PEN NEEDLES 32G X 4 MM MISC USE 2 TIMES DAILY AS DIRECTED , 1 PEN  NEEDLE PER INJECTION.  ? Continuous Blood Gluc Sensor (FREESTYLE LIBRE SENSOR SYSTEM) MISC Check blood sugar 3 times a day. Dx:E11.8, Z79.4  ? Continuous Blood Gluc Sensor MISC Check blood sugar 3 times a day- Dx: E11.8/Z79.4  ? dexamethasone (DECADRON) 1 MG tablet Take 3 tabs for 3 days, 2 tabs for 3 days 1 tab for 5 days. Take with food.  ? Dulaglutide (TRULICITY) 1.5 DQ/2.2WL SOPN INJECT 0.5ML (1.5MG) SUBCUTANEOUSLY ONCE WEEKLY  ? EASY COMFORT INSULIN SYRINGE 30G X 1/2" 0.5 ML MISC   ? gabapentin (NEURONTIN) 300 MG capsule Take 300 mg by mouth 3 (three) times daily.  ? glucose blood (ONETOUCH VERIO) test strip USE 1 STRIP TO CHECK GLUCOSE THREE TIMES DAILY AS DIRECTED  ? hyoscyamine (LEVSIN SL) 0.125 MG SL tablet Take 1 to 2 tablets 3 to 4 x day if needed for Nausea, vomiting, cramping or diarrhea (Patient not taking: Reported on 06/22/2021)  ? Insulin Glargine (BASAGLAR KWIKPEN) 100 UNIT/ML DIAL AND INJECT 30 UNITS UNDER THE SKIN ONCE DAILY AS DIRECTED  ? ipratropium (ATROVENT) 0.03 % nasal spray Place 2 sprays into the nose 3 (three) times daily.  ? lactulose (CHRONULAC) 10 GM/15ML solution Take 15 ml (3 tsp) once or twice daily as needed for constipation. (Patient not taking: Reported on 05/03/2021)  ? Lancet Devices (ADJUSTABLE LANCING DEVICE) MISC CHECK BLOOD SUGAR 3 TIMES A DAY  ? Magnesium 400  MG TABS Take 1 tablet by mouth daily. (Patient not taking: Reported on 05/03/2021)  ? Melatonin 10 MG TABS Take 1 tablet by mouth daily.  ? methocarbamol (ROBAXIN) 500 MG tablet Take 500 mg by mouth 4 (four) times daily.  ? Multiple Vitamins-Minerals (CENTRUM SILVER 50+WOMEN) TABS Take 1 tablet by mouth daily.  ? Multiple Vitamins-Minerals (ICAPS AREDS 2 PO) Take 1 capsule by mouth daily.  ? rOPINIRole (REQUIP) 3 MG tablet Take 1 to 2 tablets  at Bedtime  for Restless Legs                                                               /                     TAKE  BY MOUTH  ? rosuvastatin (CRESTOR) 20 MG tablet TAKE 1 TABLET  BY MOUTH ONCE DAILY FOR CHOLESTEROL  ? senna-docusate (SENOKOT-S) 8.6-50 MG tablet Take 1 tablet by mouth as needed.  ? traMADol (ULTRAM) 50 MG tablet Take 50 mg by mouth every 6 (six) hours as needed for moderate pain. Pt only takes 2 pills per night  ? vitamin B-12 (CYANOCOBALAMIN) 1000 MCG tablet Take 1,000 mcg by mouth daily.  ? vitamin C (ASCORBIC ACID) 500 MG tablet Take 500 mg by mouth daily.  ? zinc gluconate 50 MG tablet Take 50 mg by mouth daily.  ? ?No current facility-administered medications for this visit. (Other)  ? ? ? ? ?REVIEW OF SYSTEMS: ?ROS   ?Negative for: Constitutional, Gastrointestinal, Neurological, Skin, Genitourinary, Musculoskeletal, HENT, Endocrine, Cardiovascular, Eyes, Respiratory, Psychiatric, Allergic/Imm, Heme/Lymph ?Last edited by Silvestre Moment on 07/08/2021 10:34 AM.  ?  ? ? ? ?ALLERGIES ?Allergies  ?Allergen Reactions  ? Morphine And Related Other (See Comments)  ?  fuzzy feeling  ? ? ?PAST MEDICAL HISTORY ?Past Medical History:  ?Diagnosis Date  ? Arthritis   ? Chronic back pain   ? Cystoid macular edema of right eye 08/19/2019  ? Diabetic neuropathy ([place])   ? Exudative age-related macular degeneration of left eye with inactive choroidal neovascularization ([place]) 11/18/2019  ? Hypercholesteremia   ? Hypertension   ? Liver lesion   ? Neuropathy   ? Type II or unspecified type diabetes mellitus without mention of complication, not stated as uncontrolled   ? Vitamin D deficiency   ? Wears glasses   ? ?Past Surgical History:  ?Procedure Laterality Date  ? ABDOMINAL HYSTERECTOMY    ? APPENDECTOMY    ? BACK SURGERY  1993  ? lumb lam-fusion  ? BREAST EXCISIONAL BIOPSY Right 1967  ? benign  ? CESAREAN SECTION    ? x 3   ? COLONOSCOPY    ? EYE SURGERY    ? both cataracts  ? TRIGGER FINGER RELEASE  01/10/2012  ? Procedure: RELEASE TRIGGER FINGER/A-1 PULLEY;  Surgeon: Hessie Dibble, MD;  Location: [facility];  Service: Orthopedics;  Laterality: Right;  right ring trigger release   ? ? ?FAMILY HISTORY ?Family History  ?Problem Relation Age of Onset  ? Diabetes Sister   ? Liver disease Sister   ? Liver cancer Sister   ? Lymphoma Sister   ? Stroke Brother   ? Diabetes Brother   ? Kidney cancer Brother   ?  Colon cancer Neg Hx   ? ? ?SOCIAL HISTORY ?Social History  ? ?Tobacco Use  ? Smoking status: Never  ? Smokeless tobacco: Never  ?Substance Use Topics  ? Alcohol use: No  ? Drug use: No  ? ?  ? ?  ? ?OPHTHALMIC EXAM: ? ?Base Eye Exam   ? ? Visual Acuity (ETDRS)   ? ?   Right Left  ? Dist cc CF at 3' 20/150  ? Dist ph cc  NI  ? ? Correction: Glasses  ? ?  ?  ? ? Tonometry (Tonopen, 10:43 AM)   ? ?   Right Left  ? Pressure 9 11  ? ?  ?  ? ? Pupils   ? ?   Pupils Dark Light Shape React APD  ? Right PERRL 2 1 Round Slow None  ? Left PERRL 2 1 Round Slow None  ? ?  ?  ? ? Visual Fields   ? ?   Left Right  ? Restrictions Partial inner superior temporal, inferior temporal, superior nasal, inferior nasal deficiencies Partial inner superior temporal, inferior temporal, superior nasal, inferior nasal deficiencies  ? ?  ?  ? ? Extraocular Movement   ? ?   Right Left  ?  Full, Ortho Full, Ortho  ? ?  ?  ? ? Neuro/Psych   ? ? Oriented x3: Yes  ? Mood/Affect: Normal  ? ?  ?  ? ? Dilation   ? ? Both eyes: 1.0% Mydriacyl, 2.5% Phenylephrine @ 10:43 AM  ? ?  ?  ? ?  ? ?Slit Lamp and Fundus Exam   ? ? External Exam   ? ?   Right Left  ? External Normal Normal  ? ?  ?  ? ? Slit Lamp Exam   ? ?   Right Left  ? Lids/Lashes Normal Normal  ? Conjunctiva/Sclera White and quiet White and quiet  ? Cornea Clear Clear  ? Anterior Chamber Deep and quiet Deep and quiet  ? Iris Round and reactive Round and reactive  ? Lens Posterior chamber intraocular lens Posterior chamber intraocular lens  ? Anterior Vitreous Normal Normal  ? ?  ?  ? ? Fundus Exam   ? ?   Right Left  ? Posterior Vitreous Posterior vitreous detachment Posterior vitreous detachment  ? Disc Normal Normal  ? C/D Ratio 0.4 0.45  ? Macula Geographic atrophy 3  DA size, Advanced age related macular degeneration, no macular thickening, Retinal pigment epithelial mottling, no exudates, no hemorrhage Geographic atrophy 2 DA size, Advanced age related macular degene

## 2021-07-08 NOTE — Assessment & Plan Note (Signed)
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

## 2021-07-08 NOTE — Assessment & Plan Note (Signed)
Atrophy limits acuity 

## 2021-07-20 ENCOUNTER — Encounter: Payer: Self-pay | Admitting: Nurse Practitioner

## 2021-07-20 ENCOUNTER — Other Ambulatory Visit: Payer: Self-pay

## 2021-07-20 ENCOUNTER — Ambulatory Visit (INDEPENDENT_AMBULATORY_CARE_PROVIDER_SITE_OTHER): Payer: Medicare Other | Admitting: Nurse Practitioner

## 2021-07-20 VITALS — BP 128/70 | HR 79 | Temp 97.9°F | Resp 17 | Ht 63.0 in | Wt 111.8 lb

## 2021-07-20 DIAGNOSIS — R051 Acute cough: Secondary | ICD-10-CM | POA: Diagnosis not present

## 2021-07-20 DIAGNOSIS — J069 Acute upper respiratory infection, unspecified: Secondary | ICD-10-CM

## 2021-07-20 DIAGNOSIS — R0981 Nasal congestion: Secondary | ICD-10-CM

## 2021-07-20 DIAGNOSIS — Z794 Long term (current) use of insulin: Secondary | ICD-10-CM

## 2021-07-20 DIAGNOSIS — R062 Wheezing: Secondary | ICD-10-CM

## 2021-07-20 DIAGNOSIS — E118 Type 2 diabetes mellitus with unspecified complications: Secondary | ICD-10-CM | POA: Diagnosis not present

## 2021-07-20 MED ORDER — AZITHROMYCIN 250 MG PO TABS: ORAL_TABLET | ORAL | 0 refills | Status: AC

## 2021-07-20 MED ORDER — BENZONATATE 100 MG PO CAPS: 100.0000 mg | ORAL_CAPSULE | Freq: Three times a day (TID) | ORAL | 0 refills | Status: DC

## 2021-07-20 MED ORDER — IPRATROPIUM-ALBUTEROL 0.5-2.5 (3) MG/3ML IN SOLN: 3.0000 mL | Freq: Once | RESPIRATORY_TRACT | Status: DC

## 2021-07-20 NOTE — Progress Notes (Signed)
Assessment and Plan: ? ?Ms. Springston was seen today for an episodic visit. ? ?Diagnoses and all order for this visit: ? ?1. Upper respiratory tract infection, unspecified type/Acute cough/Nasal Congestion ? ?Take abx in full as directed. ?Continue to stay well hydrated to keep  mucus thin and productive.   ?Benzonatate capsules provided for cough. ?Suggested OTC antihistamine.  ? ?- azithromycin (ZITHROMAX Z-PAK) 250 MG tablet; Take 2 tablets (500 mg) on  Day 1,  followed by 1 tablet (250 mg) once daily on Days 2 through 5.  Dispense: 6 each; Refill: 0 ?- benzonatate (TESSALON) 100 MG capsule; Take 1 capsule (100 mg total) by mouth every 8 (eight) hours.  Dispense: 21 capsule; Refill: 0 ? ?4. Wheezing ? ?Lungs CTA after completion of Duoneb.  ?Patient tolerated well. ? ?- ipratropium-albuterol (DUONEB) 0.5-2.5 (3) MG/3ML nebulizer solution 3 mL ? ?5. Type 2 diabetes mellitus with complication, with long-term current use of insulin (HCC) ? ?Discussed how when sick, BG levels can increase. ?Continue to monitor BG with tight control of dietary intake of sugars and carbs.   ?Currently well controlled. ? ?Continue to monitor for any increase in fever, chills, N/V.   ?RTC if s/s fail to improve.   ? ? ?Discussed med's effects and SE's.   ?Over 20 minutes of exam, counseling, chart review, and critical decision making was performed.  ? ?Future Appointments  ?Date Time Provider Department Center  ?08/04/2021  2:30 PM McKeown, William, MD [facility] None  ?10/14/2021  9:30 AM Rankin, Gary A, MD [facility] None  ?12/07/2021  2:00 PM Corbett, Ashley, NP [facility] None  ?12/23/2021 11:00 AM Corbett, Ashley, NP [facility] None  ?01/04/2022 11:00 AM Adegoke, Andrea, RPH [facility] None  ? ? ?------------------------------------------------------------------------------------------------------------------ ? ? ?HPI ?BP 128/70   Pulse 79   Temp 97.9 ?F (36.6 ?C)   Resp 17   Ht 5' 3" (1.6 m)   Wt 111 lb 12.8 oz (50.7 kg)   SpO2 97%    BMI 19.80 kg/m?  ? ? ?88 y.o.female with hx of IDDM, poor memory, presents to clinic w/ c/o scratchy, productive cough with green/brown phlegm, HA, fatigue x 4-5 days.  She hast taken OTC cough and cold medication, with some effectiveness.  No hx of COPD, asthma. Never a smoker.  Denies SOB.   ? ?She has been checking her BG in home and reports slight increase in the 90s, otherwise stable.  She is 88 in clinic today.  Denies any s/s hypo/hyperglycemia.  ? ? ? ?Past Medical History:  ?Diagnosis Date  ? Arthritis   ? Chronic back pain   ? Cystoid macular edema of right eye 08/19/2019  ? Diabetic neuropathy (HCC)   ? 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) 11/18/2019  ? Hypercholesteremia   ? Hypertension   ? Liver lesion   ? Neuropathy   ? Type II or unspecified type diabetes mellitus without mention of complication, not stated as uncontrolled   ? Vitamin D deficiency   ? Wears glasses   ?  ? ?Allergies  ?Allergen Reactions  ? Morphine And Related Other (See Comments)  ?  fuzzy feeling  ? ? ?Current Outpatient Medications on File Prior to Visit  ?Medication Sig  ? AquaLance Lancets 30G MISC CHECK BLOOD SUGAR 3 TIMES A DAY  ? aspirin 81 MG tablet Take 81 mg by mouth daily.  ? bisoprolol-hydrochlorothiazide (ZIAC) 2.5-6.25 MG tablet Take 1 tablet by mouth daily.  ? Blood Glucose Calibration (  ACCU-CHEK AVIVA) SOLN Check blood sugar 3 times daily-DX-E11.22  ? Blood Glucose Monitoring Suppl (ONETOUCH VERIO FLEX SYSTEM) w/Device KIT Check blood sugar 3 times daily-E11.8  ? buPROPion (WELLBUTRIN XL) 150 MG 24 hr tablet Take 1 tablet every Morning for Mood , Focus & Concentration  ? Cholecalciferol (VITAMIN D3) 2000 UNITS TABS Take 1 capsule by mouth 2 (two) times daily.  ? COMFORT EZ PEN NEEDLES 32G X 4 MM MISC USE 2 TIMES DAILY AS DIRECTED , 1 PEN NEEDLE PER INJECTION.  ? Continuous Blood Gluc Sensor (FREESTYLE LIBRE SENSOR SYSTEM) MISC Check blood sugar 3 times  a day. Dx:E11.8, Z79.4  ? Continuous Blood Gluc Sensor MISC Check blood sugar 3 times a day- Dx: E11.8/Z79.4  ? dorzolamide-timolol (COSOPT) 22.3-6.8 MG/ML ophthalmic solution Place 1 drop into both eyes 2 (two) times daily.  ? Dulaglutide (TRULICITY) 1.5 MG/0.5ML SOPN INJECT 0.5ML (1.5MG) SUBCUTANEOUSLY ONCE WEEKLY  ? EASY COMFORT INSULIN SYRINGE 30G X 1/2" 0.5 ML MISC   ? gabapentin (NEURONTIN) 300 MG capsule Take 300 mg by mouth 3 (three) times daily.  ? glucose blood (ONETOUCH VERIO) test strip USE 1 STRIP TO CHECK GLUCOSE THREE TIMES DAILY AS DIRECTED  ? Insulin Glargine (BASAGLAR KWIKPEN) 100 UNIT/ML DIAL AND INJECT 30 UNITS UNDER THE SKIN ONCE DAILY AS DIRECTED  ? ipratropium (ATROVENT) 0.03 % nasal spray Place 2 sprays into the nose 3 (three) times daily.  ? Lancet Devices (ADJUSTABLE LANCING DEVICE) MISC CHECK BLOOD SUGAR 3 TIMES A DAY  ? latanoprost (XALATAN) 0.005 % ophthalmic solution Place 1 drop into both eyes at bedtime.  ? Melatonin 10 MG TABS Take 1 tablet by mouth daily.  ? methocarbamol (ROBAXIN) 500 MG tablet Take 500 mg by mouth 4 (four) times daily.  ? Multiple Vitamins-Minerals (CENTRUM SILVER 50+WOMEN) TABS Take 1 tablet by mouth daily.  ? Multiple Vitamins-Minerals (ICAPS AREDS 2 PO) Take 1 capsule by mouth daily.  ? rOPINIRole (REQUIP) 3 MG tablet Take 1 to 2 tablets  at Bedtime  for Restless Legs                                                               /                     TAKE  BY MOUTH  ? rosuvastatin (CRESTOR) 20 MG tablet TAKE 1 TABLET BY MOUTH ONCE DAILY FOR CHOLESTEROL  ? senna-docusate (SENOKOT-S) 8.6-50 MG tablet Take 1 tablet by mouth as needed.  ? traMADol (ULTRAM) 50 MG tablet Take 50 mg by mouth every 6 (six) hours as needed for moderate pain. Pt only takes 2 pills per night  ? vitamin B-12 (CYANOCOBALAMIN) 1000 MCG tablet Take 1,000 mcg by mouth daily.  ? vitamin C (ASCORBIC ACID) 500 MG tablet Take 500 mg by mouth daily.  ? zinc gluconate 50 MG tablet Take 50 mg by  mouth daily.  ? dexamethasone (DECADRON) 1 MG tablet Take 3 tabs for 3 days, 2 tabs for 3 days 1 tab for 5 days. Take with food. (Patient not taking: Reported on 07/20/2021)  ? hyoscyamine (LEVSIN SL) 0.125 MG SL tablet Take 1 to 2 tablets 3 to 4 x day if needed for Nausea, vomiting, cramping or diarrhea (Patient not taking: Reported on   06/22/2021)  ? lactulose (CHRONULAC) 10 GM/15ML solution Take 15 ml (3 tsp) once or twice daily as needed for constipation. (Patient not taking: Reported on 05/03/2021)  ? Magnesium 400 MG TABS Take 1 tablet by mouth daily. (Patient not taking: Reported on 05/03/2021)  ? ?No current facility-administered medications on file prior to visit.  ? ? ?ROS: all negative except what is noted in the HPI.  ? ?Physical Exam: ? ?BP 128/70   Pulse 79   Temp 97.9 ?F (36.6 ?C)   Resp 17   Ht 5' 3" (1.6 m)   Wt 111 lb 12.8 oz (50.7 kg)   SpO2 97%   BMI 19.80 kg/m?  ? ?General Appearance: NAD.  Awake, conversant and cooperative. ?Eyes: PERRLA, EOMs intact.  Sclera white.  Conjunctiva without erythema. ?Sinuses: No frontal/maxillary tenderness.  No nasal discharge. Nares patent.  ?ENT/Mouth: Ext aud canals clear.  Bilateral TMs w/DOL and without erythema or bulging. Hearing intact.  Posterior pharynx without swelling or exudate.  Tonsils without swelling or erythema.  ?Neck: Supple.  No masses, nodules or thyromegaly. ?Respiratory: Mild expiratory wheezing right posterior mid lung.  Effort is regular with non-labored breathing.  ?Cardio: RRR with no MRGs. Brisk peripheral pulses without edema.  ?Abdomen: Active BS in all four quadrants.  Soft and non-tender without guarding, rebound tenderness, hernias or masses. ?Lymphatics: Non tender without lymphadenopathy.  ?Musculoskeletal: Full ROM, 5/5 strength, normal ambulation.  No clubbing or cyanosis. ?Skin: Appropriate color for ethnicity. Warm without rashes, lesions, ecchymosis, ulcers.  ?Neuro: CN II-XII grossly normal. Normal muscle tone without  cerebellar symptoms and intact sensation.   ?Psych: AO X 3,  appropriate mood and affect, insight and judgment.  ?  ? ?Darrol Jump, NP ?11:21 AM ?[facility] ? ?

## 2021-07-27 ENCOUNTER — Other Ambulatory Visit: Payer: Self-pay | Admitting: Adult Health

## 2021-07-27 DIAGNOSIS — E782 Mixed hyperlipidemia: Secondary | ICD-10-CM

## 2021-08-04 ENCOUNTER — Encounter: Payer: Self-pay | Admitting: Internal Medicine

## 2021-08-04 ENCOUNTER — Ambulatory Visit (INDEPENDENT_AMBULATORY_CARE_PROVIDER_SITE_OTHER): Payer: Medicare Other | Admitting: Internal Medicine

## 2021-08-04 VITALS — BP 140/68 | HR 81 | Temp 97.9°F | Resp 16 | Ht 63.0 in | Wt 112.0 lb

## 2021-08-04 DIAGNOSIS — E785 Hyperlipidemia, unspecified: Secondary | ICD-10-CM

## 2021-08-04 DIAGNOSIS — E1169 Type 2 diabetes mellitus with other specified complication: Secondary | ICD-10-CM

## 2021-08-04 DIAGNOSIS — E559 Vitamin D deficiency, unspecified: Secondary | ICD-10-CM | POA: Diagnosis not present

## 2021-08-04 DIAGNOSIS — F325 Major depressive disorder, single episode, in full remission: Secondary | ICD-10-CM

## 2021-08-04 DIAGNOSIS — I10 Essential (primary) hypertension: Secondary | ICD-10-CM

## 2021-08-04 DIAGNOSIS — Z79899 Other long term (current) drug therapy: Secondary | ICD-10-CM

## 2021-08-04 DIAGNOSIS — N1831 Chronic kidney disease, stage 3a: Secondary | ICD-10-CM

## 2021-08-04 DIAGNOSIS — Z794 Long term (current) use of insulin: Secondary | ICD-10-CM

## 2021-08-04 DIAGNOSIS — E1122 Type 2 diabetes mellitus with diabetic chronic kidney disease: Secondary | ICD-10-CM | POA: Diagnosis not present

## 2021-08-04 NOTE — Patient Instructions (Signed)

## 2021-08-04 NOTE — Progress Notes (Signed)
? ?Future Appointments  ?Date Time Provider Department  ?08/04/2021  2:30 PM [name], MD [facility]  ?10/14/2021  9:30 AM Zadie Rhine Clent Demark, MD [facility]  ?12/07/2021            CPE   2:00 PM Liane Comber, NP [facility]  ?12/23/2021           Wellness 11:00 AM Liane Comber, NP [facility]  ?01/04/2022 11:00 AM Newton Pigg, RPH [facility]  ? ? ?History of Present Illness: ? ? ?    This very nice 88 y.o. MWF presents for 6  month follow up with HTN, HLD, Pre-Diabetes and Vitamin D Deficiency. Abdominal CT scan in 2019 found Aortic Atherosclerosis. ? ? ?    Patient is treated for HTN ( 1996) & BP has been controlled at home. Today?s BP is at goal - 140/68.  In 2009, she had a negative Cardiolite scan. Patient has had no complaints of any cardiac type chest pain, palpitations, dyspnea / orthopnea / PND, dizziness, claudication, or dependent edema. ? ? ?    Hyperlipidemia is controlled with diet & meds. Patient denies myalgias or other med SE?s. Last Lipids were at goal except slightly elevated Trig's : ? ?Lab Results  ?Component Value Date  ? CHOL 122 04/27/2021  ? HDL 48 (L) 04/27/2021  ? LDLCALC 49 04/27/2021  ? TRIG 177 (H) 04/27/2021  ? CHOLHDL 2.5 04/27/2021  ? ? ? ?Also, the patient has history of T2_NIDDM  (1996) w/CKD 3a (GFR 47)  and is on Basaglar ( usu 16 u /day) & Trulicity. She has had no symptoms of reactive hypoglycemia, diabetic polys, paresthesias or visual blurring.  Last A1c was not at goal : ? ?Lab Results  ?Component Value Date  ? HGBA1C 7.0 (H) 04/27/2021  ? ?    ? ?                                                  Further, the patient also has history of Vitamin D Deficiency and supplements vitamin D without any suspected side-effects. Last vitamin D was at goal : ? ?Lab Results  ?Component Value Date  ? VD25OH 67 12/03/2020  ? ? ? ?Current Outpatient Medications on File Prior to Visit  ?Medication Sig  ? AquaLance Lancets 30G MISC CHECK BLOOD SUGAR 3 TIMES A DAY  ? aspirin 81 MG tablet  Take  daily.  ? bisoprolol-hctz  2.5-6.25 MG tablet Take 1 tablet daily.  ? buPROPion-XL 150 MG  Take 1 tablet every Morning   ? VITAMIN D 2000 u Take 1 capsule  2  times daily.  ? COSOPT  ophth soln Place 1 drop into both eyes 2 (two) times daily.  ? Dulaglutide  1.5 MG/0.5ML SOPN INJECT 0.5ML  ONCE WEEKLY  ? gabapentin 300 MG capsule Take  3  times daily.  ? Insulin Glargine (BASAGLAR KWIKPEN) 100 UNIT/ML DIAL AND INJECT 30 UNITS UNDER THE SKIN ONCE DAILY AS DIRECTED  ? ipratropium (ATROVENT) 0.03 % nasal spray Place 2 sprays into the nose 3 (times daily.  ? latanoprost (XALATAN) 0.005 % ophthalmic solution Place 1 drop into both eyes at bedtime.  ? Melatonin 10 MG TABS Take 1 tablet  daily.  ? Methocarbamol 500 MG tablet Take 4 times daily.  ? CENTRUM SILVER 50+WOMEN Take 1 tablet  daily.  ? CAPS  AREDS 2  Take 1 capsule  daily.  ? rOPINIRole  3 MG tablet Take 1 to 2 tablets  at Bedtime    ? rosuvastatin 20 MG tablet Take  1 tablet  Daily   ? SENOKOT-S) 8.6-50 MG tablet Take 1 tablet as needed.  ? traMADol (ULTRAM) 50 MG tablet Take every 6 hours as needed for moderate pain. Pt only takes 2 pills per night  ? vitamin B-12  1000 MCG tablet Take daily.  ? vitamin C 500 MG tablet Take  daily.  ? zinc gluconate 50 MG tablet Take 50 mg by mouth daily.  ? ? ? ? ?Allergies  ?Allergen Reactions  ? Morphine And Related Other (See Comments)  ?  fuzzy feeling  ? ? ? ?PMHx:   ?Past Medical History:  ?Diagnosis Date  ? Arthritis   ? Chronic back pain   ? Cystoid macular edema of right eye 08/19/2019  ? Diabetic neuropathy ([place])   ? Exudative age-related macular degeneration of left eye with inactive choroidal neovascularization ([place]) 11/18/2019  ? Hypercholesteremia   ? Hypertension   ? Liver lesion   ? Neuropathy   ? Type II or unspecified type diabetes mellitus without mention of complication, not stated as uncontrolled   ? Vitamin D deficiency   ? Wears glasses   ? ? ? ?Immunization History  ?Administered Date(s) Administered   ? DT  01/04/2011  ? Influenza Split 01/23/2013  ? Influenza, High Dose  02/08/2018, 01/02/2019, 01/29/2021  ? Influenza 02/03/2020  ? PFIZER SARS-COV-2 Vacc  07/22/2019, 08/13/2019  ? Pneumococcal -13 02/27/2014  ? Pneumococcal -23 01/06/2012  ? ? ? ?Past Surgical History:  ?Procedure Laterality Date  ? ABDOMINAL HYSTERECTOMY    ? APPENDECTOMY    ? BACK SURGERY  1993  ? lumb lam-fusion  ? BREAST EXCISIONAL BIOPSY Right 1967  ? benign  ? CESAREAN SECTION    ? x 3   ? COLONOSCOPY    ? EYE SURGERY    ? both cataracts  ? TRIGGER FINGER RELEASE  01/10/2012  ? Procedure: RELEASE TRIGGER FINGER/A-1 PULLEY;  Surgeon: Hessie Dibble, MD;  Location: [facility];  Service: Orthopedics;  Laterality: Right;  right ring trigger release  ? ? ?FHx:    Reviewed / unchanged ? ?SHx:    Reviewed / unchanged  ? ?Systems Review: ? ?Constitutional: Denies fever, chills, wt changes, headaches, insomnia, fatigue, night sweats, change in appetite. ?Eyes: Denies redness, blurred vision, diplopia, discharge, itchy, watery eyes.  ?ENT: Denies discharge, congestion, post nasal drip, epistaxis, sore throat, earache, hearing loss, dental pain, tinnitus, vertigo, sinus pain, snoring.  ?CV: Denies chest pain, palpitations, irregular heartbeat, syncope, dyspnea, diaphoresis, orthopnea, PND, claudication or edema. ?Respiratory: denies cough, dyspnea, DOE, pleurisy, hoarseness, laryngitis, wheezing.  ?Gastrointestinal: Denies dysphagia, odynophagia, heartburn, reflux, water brash, abdominal pain or cramps, nausea, vomiting, bloating, diarrhea, constipation, hematemesis, melena, hematochezia  or hemorrhoids. ?Genitourinary: Denies dysuria, frequency, urgency, nocturia, hesitancy, discharge, hematuria or flank pain. ?Musculoskeletal: Denies arthralgias, myalgias, stiffness, jt. swelling, pain, limping or strain/sprain.  ?Skin: Denies pruritus, rash, hives, warts, acne, eczema or change in skin lesion(s). ?Neuro: No weakness, tremor,  incoordination, spasms, paresthesia or pain. ?Psychiatric: Denies confusion, memory loss or sensory loss. ?Endo: Denies change in weight, skin or hair change.  ?Heme/Lymph: No excessive bleeding, bruising or enlarged lymph nodes. ? ?Physical Exam ? ?BP 140/68   Pulse 81   Temp 97.9 ?F (36.6 ?C)   Resp 16   Ht '5\' 3"'$  (  1.6 m)   Wt 112 lb (50.8 kg)   SpO2 97%   BMI 19.84 kg/m?  ? ?Appears  well nourished, well groomed  and in no distress. ? ?Eyes: PERRLA, EOMs, conjunctiva no swelling or erythema. ?Sinuses: No frontal/maxillary tenderness ?ENT/Mouth: EAC's clear, TM's nl w/o erythema, bulging. Nares clear w/o erythema, swelling, exudates. Oropharynx clear without erythema or exudates. Oral hygiene is good. Tongue normal, non obstructing. Hearing intact.  ?Neck: Supple. Thyroid not palpable. Car 2+/2+ without bruits, nodes or JVD. ?Chest: Respirations nl with BS clear & equal w/o rales, rhonchi, wheezing or stridor.  ?Cor: Heart sounds normal w/ regular rate and rhythm without sig. murmurs, gallops, clicks or rubs. Peripheral pulses normal and equal  without edema.  ?Abdomen: Soft & bowel sounds normal. Non-tender w/o guarding, rebound, hernias, masses or organomegaly.  ?Lymphatics: Unremarkable.  ?Musculoskeletal: Full ROM all peripheral extremities, joint stability, 5/5 strength and normal gait.  ?Skin: Warm, dry without exposed rashes, lesions or ecchymosis apparent.  ?Neuro: Cranial nerves intact, reflexes equal bilaterally. Sensory-motor testing grossly intact. Tendon reflexes grossly intact.  ?Pysch: Alert & oriented x 3.  Insight and judgement nl & appropriate. No ideations. ? ?Assessment and Plan: ? ?1. Essential hypertension ? ?- Continue medication, monitor blood pressure at home.  ?- Continue DASH diet.  Reminder to go to the ER if any CP,  ?SOB, nausea, dizziness, severe HA, changes vision/speech. ?  ?- CBC with Differential/Platelet ?- COMPLETE METABOLIC PANEL WITH GFR ?- Magnesium ?- TSH ? ?2.  Hyperlipidemia associated with type 2 diabetes mellitus ([place]) ? ?- Continue diet/meds, exercise,& lifestyle modifications.  ?- Continue monitor periodic cholesterol/liver & renal functions  ?  ?- Lipid panel ?- TSH ? ?

## 2021-08-05 ENCOUNTER — Other Ambulatory Visit: Payer: Self-pay | Admitting: Internal Medicine

## 2021-08-05 DIAGNOSIS — D649 Anemia, unspecified: Secondary | ICD-10-CM

## 2021-08-05 LAB — COMPLETE METABOLIC PANEL WITH GFR
AG Ratio: 1.1 (calc) (ref 1.0–2.5)
ALT: 9 U/L (ref 6–29)
AST: 21 U/L (ref 10–35)
Albumin: 3.9 g/dL (ref 3.6–5.1)
Alkaline phosphatase (APISO): 102 U/L (ref 37–153)
BUN/Creatinine Ratio: 19 (calc) (ref 6–22)
BUN: 26 mg/dL — ABNORMAL HIGH (ref 7–25)
CO2: 31 mmol/L (ref 20–32)
Calcium: 10 mg/dL (ref 8.6–10.4)
Chloride: 103 mmol/L (ref 98–110)
Creat: 1.37 mg/dL — ABNORMAL HIGH (ref 0.60–0.95)
Globulin: 3.4 g/dL (calc) (ref 1.9–3.7)
Glucose, Bld: 136 mg/dL — ABNORMAL HIGH (ref 65–99)
Potassium: 5.5 mmol/L — ABNORMAL HIGH (ref 3.5–5.3)
Sodium: 142 mmol/L (ref 135–146)
Total Bilirubin: 0.4 mg/dL (ref 0.2–1.2)
Total Protein: 7.3 g/dL (ref 6.1–8.1)
eGFR: 37 mL/min/{1.73_m2} — ABNORMAL LOW (ref >=60)

## 2021-08-05 LAB — HEMOGLOBIN A1C
Hgb A1c MFr Bld: 7.4 % of total Hgb — ABNORMAL HIGH (ref <5.7)
Mean Plasma Glucose: 166 mg/dL
eAG (mmol/L): 9.2 mmol/L

## 2021-08-05 LAB — CBC WITH DIFFERENTIAL/PLATELET
Absolute Monocytes: 962 cells/uL — ABNORMAL HIGH (ref 200–950)
Basophils Absolute: 78 cells/uL (ref 0–200)
Basophils Relative: 0.6 %
Eosinophils Absolute: 416 cells/uL (ref 15–500)
Eosinophils Relative: 3.2 %
HCT: 34.9 % — ABNORMAL LOW (ref 35.0–45.0)
Hemoglobin: 11.6 g/dL — ABNORMAL LOW (ref 11.7–15.5)
Lymphs Abs: 3900 cells/uL (ref 850–3900)
MCH: 31.6 pg (ref 27.0–33.0)
MCHC: 33.2 g/dL (ref 32.0–36.0)
MCV: 95.1 fL (ref 80.0–100.0)
MPV: 12.1 fL (ref 7.5–12.5)
Monocytes Relative: 7.4 %
Neutro Abs: 7644 cells/uL (ref 1500–7800)
Neutrophils Relative %: 58.8 %
Platelets: 268 10*3/uL (ref 140–400)
RBC: 3.67 10*6/uL — ABNORMAL LOW (ref 3.80–5.10)
RDW: 12.1 % (ref 11.0–15.0)
Total Lymphocyte: 30 %
WBC: 13 10*3/uL — ABNORMAL HIGH (ref 3.8–10.8)

## 2021-08-05 LAB — LIPID PANEL
Cholesterol: 135 mg/dL (ref <200)
HDL: 55 mg/dL (ref >=50)
LDL Cholesterol (Calc): 59 mg/dL (calc)
Non-HDL Cholesterol (Calc): 80 mg/dL (calc) (ref <130)
Total CHOL/HDL Ratio: 2.5 (calc) (ref <5.0)
Triglycerides: 130 mg/dL (ref <150)

## 2021-08-05 LAB — TSH: TSH: 1.55 mIU/L (ref 0.40–4.50)

## 2021-08-05 LAB — VITAMIN D 25 HYDROXY (VIT D DEFICIENCY, FRACTURES): Vit D, 25-Hydroxy: 60 ng/mL (ref 30–100)

## 2021-08-05 LAB — MAGNESIUM: Magnesium: 2.4 mg/dL (ref 1.5–2.5)

## 2021-08-05 NOTE — Progress Notes (Signed)
?<><><><><><><><><><><><><><><><><><><><><><><><><><><><><><><><><> ?<><><><><><><><><><><><><><><><><><><><><><><><><><><><><><><><><> ? ?- ?  Kidney functions are stable - still stage 3b ?( GFR 37)  ? ?- Potassium is slightly elevated - which may be factious or may be due to too much potassium in diet , So  ? ?- Avoid high sources of potassium like ?"No-Salt or "Lite Salt" ?of potassium  ? ?<><><><><><><><><><><><><><><><><><><><><><><><><><><><><><><><><> ? ?- A1c ?is 7.4% - Too High - Recommend stricter diet  ??  ?- Avoid Sweets, Candy & White Stuff  ? ?- White Rice, White Potatoes, White Flour ?- ?Breads & ?Pasta  ? ?<><><><><><><><><><><><><><><><><><><><><><><><><><><><><><><><><> ? ?- Total Chol = 135 ? ?& ? LDL Chol = 59 ? - Both ?Excellent  ? ?- Very low risk for Heart Attack ?/ Stroke  ? ?<><><><><><><><><><><><><><><><><><><><><><><><><><><><><><><><><> ? ?- ? Vitamin D= 60 ? - Excellent - Please keep dose same  ? ?<><><><><><><><><><><><><><><><><><><><><><><><><><><><><><><><><> ? ?- CBC shows mild anemia & WBC sl elevated,  ? ?? ? ? ? ? ? ? ? ? ? ? ? ? ? ?so suggest schedule a NV in 2 ?weeks to recheck CBC/diff  ? ?<><><><><><><><><><><><><><><><><><><><><><><><><><><><><><><><><> ?<><><><><><><><><><><><><><><><><><><><><><><><><><><><><><><><><>

## 2021-08-20 ENCOUNTER — Ambulatory Visit (INDEPENDENT_AMBULATORY_CARE_PROVIDER_SITE_OTHER): Payer: Medicare Other

## 2021-08-20 DIAGNOSIS — D649 Anemia, unspecified: Secondary | ICD-10-CM | POA: Diagnosis not present

## 2021-08-20 LAB — CBC WITH DIFFERENTIAL/PLATELET
Absolute Monocytes: 892 cells/uL (ref 200–950)
Basophils Absolute: 59 cells/uL (ref 0–200)
Basophils Relative: 0.6 %
Eosinophils Absolute: 392 cells/uL (ref 15–500)
Eosinophils Relative: 4 %
HCT: 37.6 % (ref 35.0–45.0)
Hemoglobin: 12.5 g/dL (ref 11.7–15.5)
Lymphs Abs: 2842 cells/uL (ref 850–3900)
MCH: 32.1 pg (ref 27.0–33.0)
MCHC: 33.2 g/dL (ref 32.0–36.0)
MCV: 96.7 fL (ref 80.0–100.0)
MPV: 11.8 fL (ref 7.5–12.5)
Monocytes Relative: 9.1 %
Neutro Abs: 5615 cells/uL (ref 1500–7800)
Neutrophils Relative %: 57.3 %
Platelets: 264 10*3/uL (ref 140–400)
RBC: 3.89 10*6/uL (ref 3.80–5.10)
RDW: 12 % (ref 11.0–15.0)
Total Lymphocyte: 29 %
WBC: 9.8 10*3/uL (ref 3.8–10.8)

## 2021-08-20 NOTE — Progress Notes (Signed)
Patient returns today for repeat labs for her anemia. The patient voiced no new complaints today.  ?

## 2021-08-24 DIAGNOSIS — H26491 Other secondary cataract, right eye: Secondary | ICD-10-CM | POA: Diagnosis not present

## 2021-08-24 DIAGNOSIS — Z961 Presence of intraocular lens: Secondary | ICD-10-CM | POA: Diagnosis not present

## 2021-08-24 DIAGNOSIS — H4321 Crystalline deposits in vitreous body, right eye: Secondary | ICD-10-CM | POA: Diagnosis not present

## 2021-08-24 DIAGNOSIS — H04123 Dry eye syndrome of bilateral lacrimal glands: Secondary | ICD-10-CM | POA: Diagnosis not present

## 2021-08-24 DIAGNOSIS — H353231 Exudative age-related macular degeneration, bilateral, with active choroidal neovascularization: Secondary | ICD-10-CM | POA: Diagnosis not present

## 2021-08-24 DIAGNOSIS — H401131 Primary open-angle glaucoma, bilateral, mild stage: Secondary | ICD-10-CM | POA: Diagnosis not present

## 2021-08-24 DIAGNOSIS — H02051 Trichiasis without entropian right upper eyelid: Secondary | ICD-10-CM | POA: Diagnosis not present

## 2021-08-24 DIAGNOSIS — E119 Type 2 diabetes mellitus without complications: Secondary | ICD-10-CM | POA: Diagnosis not present

## 2021-08-24 LAB — HM DIABETES EYE EXAM

## 2021-08-24 NOTE — Progress Notes (Signed)
PATIENT IS AWARE OF LAB RESULTS. [name] [facility]

## 2021-08-26 ENCOUNTER — Ambulatory Visit (INDEPENDENT_AMBULATORY_CARE_PROVIDER_SITE_OTHER): Payer: Medicare Other | Admitting: Adult Health

## 2021-08-26 ENCOUNTER — Encounter: Payer: Self-pay | Admitting: Adult Health

## 2021-08-26 VITALS — BP 152/80 | HR 86 | Temp 97.9°F | Resp 16 | Ht 63.0 in | Wt 113.6 lb

## 2021-08-26 DIAGNOSIS — K5900 Constipation, unspecified: Secondary | ICD-10-CM | POA: Diagnosis not present

## 2021-08-26 MED ORDER — DOCUSATE SODIUM 100 MG PO CAPS: ORAL_CAPSULE | ORAL | 2 refills | Status: DC

## 2021-08-26 NOTE — Progress Notes (Signed)
Assessment and Plan: ? ?Tiffanye was seen today for constipation. ? ?Diagnoses and all orders for this visit: ? ?Constipation, unspecified constipation type ?- Increase fiber/ water intake, decrease caffeine, increase activity level. ?-Will add on Benefiber and colace ?- check package instructions to take max senokot dose ?- add mirilax 1-2 times a day with extra water if still not improving ?- follow up if not improving, consider amitiza or low dose linzess  ?- Please go to the hospital if you have severe abdominal pain, vomiting, fever, CP, SOB.  ?-     docusate sodium (COLACE) 100 MG capsule; Take 1 capsule twice daily as needed for constipation with a bottle of water. ? ?Further disposition pending results of labs. Discussed med's effects and SE's.   ?Over 20 minutes of exam, counseling, chart review, and critical decision making was performed.  ? ?Future Appointments  ?Date Time Provider [place]  ?08/26/2021 11:00 AM Liane Comber, NP [facility] None  ?10/14/2021  9:30 AM Rankin, Clent Demark, MD [facility] None  ?12/07/2021  2:00 PM Cranford, Kenney Houseman, NP [facility] None  ?12/23/2021 11:00 AM Liane Comber, NP [facility] None  ?01/04/2022 11:00 AM Belton, Osa Craver, RPH [facility] None  ? ? ?------------------------------------------------------------------------------------------------------------------ ? ? ?HPI ?There were no vitals taken for this visit. ?88 y.o.female with hx of long standing poorly controlled T2DM, neuropathy, chronic pain on tramadol presents for concerns with constipation.  ? ?She reports does pass some stools daily but brisol 1, essentially has to "pick out." Endorses some lower abdominal pressure/discomfort. Has been taking senokot 1 gummy daily. Reports 4 bottles of water, each meal with fruits/veggies. Reports has tried miralax in the past but didn't see much benefit.  ? ?She is prescribed tramadol 50 mg q6h PRN by pain management, reports currently taking 1 tab daily in AM for abdominal and  lower back discomfort ? ?Past Medical History:  ?Diagnosis Date  ? Arthritis   ? Chronic back pain   ? Cystoid macular edema of right eye 08/19/2019  ? Diabetic neuropathy ([place])   ? Exudative age-related macular degeneration of left eye with inactive choroidal neovascularization ([place]) 11/18/2019  ? Hypercholesteremia   ? Hypertension   ? Liver lesion   ? Neuropathy   ? Type II or unspecified type diabetes mellitus without mention of complication, not stated as uncontrolled   ? Vitamin D deficiency   ? Wears glasses   ?  ? ?Allergies  ?Allergen Reactions  ? Morphine And Related Other (See Comments)  ?  fuzzy feeling  ? ? ?Current Outpatient Medications on File Prior to Visit  ?Medication Sig  ? AquaLance Lancets 30G MISC CHECK BLOOD SUGAR 3 TIMES A DAY  ? aspirin 81 MG tablet Take 81 mg by mouth daily.  ? bisoprolol-hydrochlorothiazide (ZIAC) 2.5-6.25 MG tablet Take 1 tablet by mouth daily.  ? Blood Glucose Calibration (ACCU-CHEK AVIVA) SOLN Check blood sugar 3 times daily-DX-E11.22  ? Blood Glucose Monitoring Suppl (ONETOUCH VERIO FLEX SYSTEM) w/Device KIT Check blood sugar 3 times daily-E11.8  ? buPROPion (WELLBUTRIN XL) 150 MG 24 hr tablet Take 1 tablet every Morning for Mood , Focus & Concentration  ? Cholecalciferol (VITAMIN D3) 2000 UNITS TABS Take 1 capsule by mouth 2 (two) times daily.  ? COMFORT EZ PEN NEEDLES 32G X 4 MM MISC USE 2 TIMES DAILY AS DIRECTED , 1 PEN NEEDLE PER INJECTION.  ? Continuous Blood Gluc Sensor (FREESTYLE LIBRE SENSOR SYSTEM) MISC Check blood sugar 3 times a day. Dx:E11.8, Z79.4  ? Continuous Blood  Gluc Sensor MISC Check blood sugar 3 times a day- Dx: E11.8/Z79.4  ? dorzolamide-timolol (COSOPT) 22.3-6.8 MG/ML ophthalmic solution Place 1 drop into both eyes 2 (two) times daily.  ? Dulaglutide (TRULICITY) 1.5 GO/1.1XB SOPN INJECT 0.5ML (1.5MG) SUBCUTANEOUSLY ONCE WEEKLY  ? EASY COMFORT INSULIN SYRINGE 30G X 1/2" 0.5 ML MISC   ? gabapentin (NEURONTIN) 300 MG capsule Take 300 mg by mouth 3  (three) times daily.  ? glucose blood (ONETOUCH VERIO) test strip USE 1 STRIP TO CHECK GLUCOSE THREE TIMES DAILY AS DIRECTED  ? Insulin Glargine (BASAGLAR KWIKPEN) 100 UNIT/ML DIAL AND INJECT 30 UNITS UNDER THE SKIN ONCE DAILY AS DIRECTED  ? ipratropium (ATROVENT) 0.03 % nasal spray Place 2 sprays into the nose 3 (three) times daily.  ? Lancet Devices (ADJUSTABLE LANCING DEVICE) MISC CHECK BLOOD SUGAR 3 TIMES A DAY  ? latanoprost (XALATAN) 0.005 % ophthalmic solution Place 1 drop into both eyes at bedtime.  ? Melatonin 10 MG TABS Take 1 tablet by mouth daily.  ? methocarbamol (ROBAXIN) 500 MG tablet Take 500 mg by mouth 4 (four) times daily.  ? Multiple Vitamins-Minerals (CENTRUM SILVER 50+WOMEN) TABS Take 1 tablet by mouth daily.  ? Multiple Vitamins-Minerals (ICAPS AREDS 2 PO) Take 1 capsule by mouth daily.  ? rOPINIRole (REQUIP) 3 MG tablet Take 1 to 2 tablets  at Bedtime  for Restless Legs                                                               /                     TAKE  BY MOUTH  ? rosuvastatin (CRESTOR) 20 MG tablet Take  1 tablet  Daily for Cholesterol                                    /                              TAKE                         BYMOUTH                   ONCEDAILY  ? senna-docusate (SENOKOT-S) 8.6-50 MG tablet Take 1 tablet by mouth as needed.  ? traMADol (ULTRAM) 50 MG tablet Take 50 mg by mouth every 6 (six) hours as needed for moderate pain. Pt only takes 2 pills per night  ? vitamin B-12 (CYANOCOBALAMIN) 1000 MCG tablet Take 1,000 mcg by mouth daily.  ? vitamin C (ASCORBIC ACID) 500 MG tablet Take 500 mg by mouth daily.  ? zinc gluconate 50 MG tablet Take 50 mg by mouth daily.  ? ?Current Facility-Administered Medications on File Prior to Visit  ?Medication  ? ipratropium-albuterol (DUONEB) 0.5-2.5 (3) MG/3ML nebulizer solution 3 mL  ? ? ?ROS: all negative except above.  ? ?Physical Exam: ? ?There were no vitals taken for this visit. ? ?General Appearance: Well nourished, in no  apparent distress. ?Eyes: PERRLA, conjunctiva no swelling or erythema ?ENT/Mouth: mask in place; Hearing normal.  ?  Neck: Supple, thyroid normal.  ?Respiratory: Respiratory effort normal, BS equal bilaterally without rales, rhonchi, wheezing or stridor.  ?Cardio: RRR with no MRGs. Brisk peripheral pulses without edema.  ?Abdomen: Soft, non-distended, + BS x 4.  Generalized vague tenderness, no guarding, rebound, hernias, masses. ?Lymphatics: Non tender without lymphadenopathy.  ?Musculoskeletal: no obvious deformity; slow steady gait ?Skin: Warm, dry without rashes, lesions, ecchymosis.  ?Neuro: Normal muscle tone ?Psych: Awake and oriented X 3, normal affect, Insight and Judgment appropriate.  ? ?Izora Ribas, NP ?8:44 AM ?[facility] ? ?

## 2021-08-31 ENCOUNTER — Encounter: Payer: Self-pay | Admitting: Adult Health

## 2021-08-31 ENCOUNTER — Ambulatory Visit (INDEPENDENT_AMBULATORY_CARE_PROVIDER_SITE_OTHER): Payer: Medicare Other | Admitting: Adult Health

## 2021-08-31 VITALS — BP 160/78 | HR 80 | Temp 97.7°F | Wt 112.0 lb

## 2021-08-31 DIAGNOSIS — K5903 Drug induced constipation: Secondary | ICD-10-CM | POA: Diagnosis not present

## 2021-08-31 DIAGNOSIS — G8929 Other chronic pain: Secondary | ICD-10-CM

## 2021-08-31 DIAGNOSIS — T402X5A Adverse effect of other opioids, initial encounter: Secondary | ICD-10-CM

## 2021-08-31 DIAGNOSIS — F32A Depression, unspecified: Secondary | ICD-10-CM | POA: Diagnosis not present

## 2021-08-31 MED ORDER — LUBIPROSTONE 24 MCG PO CAPS: ORAL_CAPSULE | ORAL | 0 refills | Status: DC

## 2021-08-31 MED ORDER — BUSPIRONE HCL 5 MG PO TABS: ORAL_TABLET | ORAL | 0 refills | Status: DC

## 2021-08-31 NOTE — Progress Notes (Signed)
Assessment and Plan: ? ?[name] was seen today for advice only. ? ?Diagnoses and all orders for this visit: ? ?Therapeutic opioid induced constipation ?Stop senokot, continue dulcolax, suspect opioid use contributing, will try amitiza BID per perscription ?Follow up in 1 month or sooner if needed ?Continue to encourage high fiber, fluid intake, increase activity ?-     lubiprostone (AMITIZA) 24 MCG capsule; Take 1 cap twice daily to help bowels move. ? ?Chronic pain ?Pain management follows, continue current regimen for now pending spinal cord stimulator on 09/07/2021 ?May be able to taper down/off tramadol if very effective ?Close follow up to revisit in 4 weeks or sooner if needed ? ?Depression, unspecified depression type ?Prolonged discussion; appears most likely due to her various medical conditions, currently poorly controlled constipation and chronic pain ?Reviewed each with her in detail with care plan ?Continue medications wellbutrin 150 mg ?Lifestyle discussed: diet/exerise, sleep hygiene, stress management, hydration ?Encouraged to increase social interactions, church participation, listen to music, etc ?Mood may improve with improving her medical conditions, husband will help to monitor closely and encourage, start buspar if not improving and follow up in 4 weeks or sooner if needed ?-     busPIRone (BUSPAR) 5 MG tablet; Take 1 tab three times a day as needed for mood/anxiety. ? ?Discussed med's effects and SE's.   ?Over 30 minutes of exam, counseling, chart review, and critical decision making was performed.  ? ?Future Appointments  ?Date Time Provider [name]  ?10/14/2021  9:30 AM Rankin, Clent Demark, MD [facility] None  ?12/07/2021  2:00 PM Cranford, Kenney Houseman, NP [facility] None  ?12/23/2021 11:00 AM Liane Comber, NP [facility] None  ?01/04/2022 11:00 AM Belton, Osa Craver, RPH [facility] None   ? ? ?------------------------------------------------------------------------------------------------------------------ ? ? ?HPI ?BP (!) 160/78   Pulse 80   Temp 97.7 ?F (36.5 ?C)   Wt 112 lb (50.8 kg)   SpO2 99%   BMI 19.84 kg/m?  ?88 y.o.female presents accompanied by her husband for constipation and depressed mood.  ? ?Hx of major depression, fairly controlled on wellbutrin 836 mg XR but complicated by recent chronic pain, following with pain management, taking tramadol 50 mg 2 tabs BID from them, gabapentin, planning spinal stimulator trial 09/07/2021. Mood is just much worse, crying more and feeling very down in the last 2 days. She admits not getting out much these days, listening to music and going out for dinner does lift her spirits.  ? ?She has had ongoing constipation, last week due to bristol 1 despite senakot and miralax that she was manually disimpacting switched to dulcolax 100 mg BID, does report stools are soft now but still no urge to move, just leaking without good clearance.  ? ?Past Medical History:  ?Diagnosis Date  ? Arthritis   ? Chronic back pain   ? Cystoid macular edema of right eye 08/19/2019  ? Diabetic neuropathy ([place])   ? Exudative age-related macular degeneration of left eye with inactive choroidal neovascularization ([place]) 11/18/2019  ? Hypercholesteremia   ? Hypertension   ? Liver lesion   ? Neuropathy   ? Type II or unspecified type diabetes mellitus without mention of complication, not stated as uncontrolled   ? Vitamin D deficiency   ? Wears glasses   ?  ? ?Allergies  ?Allergen Reactions  ? Morphine And Related Other (See Comments)  ?  fuzzy feeling  ? ? ?Current Outpatient Medications on File Prior to Visit  ?Medication Sig  ? AquaLance Lancets 30G MISC CHECK  BLOOD SUGAR 3 TIMES A DAY  ? aspirin 81 MG tablet Take 81 mg by mouth daily.  ? bisoprolol-hydrochlorothiazide (ZIAC) 2.5-6.25 MG tablet Take 1 tablet by mouth daily.  ? Blood Glucose Calibration (ACCU-CHEK AVIVA) SOLN  Check blood sugar 3 times daily-DX-E11.22  ? Blood Glucose Monitoring Suppl (ONETOUCH VERIO FLEX SYSTEM) w/Device KIT Check blood sugar 3 times daily-E11.8  ? buPROPion (WELLBUTRIN XL) 150 MG 24 hr tablet Take 1 tablet every Morning for Mood , Focus & Concentration  ? Cholecalciferol (VITAMIN D3) 2000 UNITS TABS Take 1 capsule by mouth 2 (two) times daily.  ? COMFORT EZ PEN NEEDLES 32G X 4 MM MISC USE 2 TIMES DAILY AS DIRECTED , 1 PEN NEEDLE PER INJECTION.  ? Continuous Blood Gluc Sensor (FREESTYLE LIBRE SENSOR SYSTEM) MISC Check blood sugar 3 times a day. Dx:E11.8, Z79.4  ? Continuous Blood Gluc Sensor MISC Check blood sugar 3 times a day- Dx: E11.8/Z79.4  ? docusate sodium (COLACE) 100 MG capsule Take 1 capsule twice daily as needed for constipation with a bottle of water.  ? dorzolamide-timolol (COSOPT) 22.3-6.8 MG/ML ophthalmic solution Place 1 drop into both eyes 2 (two) times daily.  ? Dulaglutide (TRULICITY) 1.5 DH/7.4BU SOPN INJECT 0.5ML (1.5MG) SUBCUTANEOUSLY ONCE WEEKLY  ? EASY COMFORT INSULIN SYRINGE 30G X 1/2" 0.5 ML MISC   ? gabapentin (NEURONTIN) 300 MG capsule Take 300 mg by mouth 3 (three) times daily.  ? glucose blood (ONETOUCH VERIO) test strip USE 1 STRIP TO CHECK GLUCOSE THREE TIMES DAILY AS DIRECTED  ? Insulin Glargine (BASAGLAR KWIKPEN) 100 UNIT/ML DIAL AND INJECT 30 UNITS UNDER THE SKIN ONCE DAILY AS DIRECTED  ? ipratropium (ATROVENT) 0.03 % nasal spray Place 2 sprays into the nose 3 (three) times daily.  ? Lancet Devices (ADJUSTABLE LANCING DEVICE) MISC CHECK BLOOD SUGAR 3 TIMES A DAY  ? latanoprost (XALATAN) 0.005 % ophthalmic solution Place 1 drop into both eyes at bedtime.  ? Melatonin 10 MG TABS Take 1 tablet by mouth daily.  ? methocarbamol (ROBAXIN) 500 MG tablet Take 500 mg by mouth 4 (four) times daily.  ? Multiple Vitamins-Minerals (CENTRUM SILVER 50+WOMEN) TABS Take 1 tablet by mouth daily.  ? Multiple Vitamins-Minerals (ICAPS AREDS 2 PO) Take 1 capsule by mouth daily.  ? rOPINIRole  (REQUIP) 3 MG tablet Take 1 to 2 tablets  at Bedtime  for Restless Legs                                                               /                     TAKE  BY MOUTH  ? rosuvastatin (CRESTOR) 20 MG tablet Take  1 tablet  Daily for Cholesterol                                    /                              TAKE                         BYMOUTH  ONCEDAILY  ? senna-docusate (SENOKOT-S) 8.6-50 MG tablet Take 1 tablet by mouth as needed.  ? traMADol (ULTRAM) 50 MG tablet Take 50 mg by mouth every 6 (six) hours as needed for moderate pain. Pt only takes 2 pills per night  ? vitamin B-12 (CYANOCOBALAMIN) 1000 MCG tablet Take 1,000 mcg by mouth daily.  ? vitamin C (ASCORBIC ACID) 500 MG tablet Take 500 mg by mouth daily.  ? zinc gluconate 50 MG tablet Take 50 mg by mouth daily.  ? ?Current Facility-Administered Medications on File Prior to Visit  ?Medication  ? ipratropium-albuterol (DUONEB) 0.5-2.5 (3) MG/3ML nebulizer solution 3 mL  ? ?Allergies:  ?Allergies  ?Allergen Reactions  ? Morphine And Related Other (See Comments)  ?  fuzzy feeling  ? ?Surgical History:  ?She  has a past surgical history that includes Abdominal hysterectomy; Cesarean section; Back surgery (1993); Appendectomy; Colonoscopy; Eye surgery; Trigger finger release (01/10/2012); and Breast excisional biopsy (Right, 1967). ?Family History:  ?Herfamily history includes Diabetes in her brother and sister; Kidney cancer in her brother; Liver cancer in her sister; Liver disease in her sister; Lymphoma in her sister; Stroke in her brother. ?Social History:  ? reports that she has never smoked. She has never used smokeless tobacco. She reports that she does not drink alcohol and does not use drugs. ? ?ROS: all negative except above.  ? ?Physical Exam: ? ?BP (!) 160/78   Pulse 80   Temp 97.7 ?F (36.5 ?C)   Wt 112 lb (50.8 kg)   SpO2 99%   BMI 19.84 kg/m?  ? ?General Appearance: Well nourished, in no apparent distress. ?Eyes: PERRL,  conjunctiva no swelling or erythema ?ENT/Mouth: Hearing normal.  ?Neck: Supple ?Respiratory: Respiratory effort normal ?Cardio: RRR with no MRGs. Brisk peripheral pulses without edema.  ?Abdomen: Soft, + BS.  Non tender, no guarding, rebound, hernias, masses. ?Lymphatics: Non tender without lymphadenopathy.  ?Musculoskeletal: sl

## 2021-09-02 ENCOUNTER — Encounter: Payer: Self-pay | Admitting: Internal Medicine

## 2021-09-07 DIAGNOSIS — M5416 Radiculopathy, lumbar region: Secondary | ICD-10-CM | POA: Diagnosis not present

## 2021-09-07 DIAGNOSIS — M961 Postlaminectomy syndrome, not elsewhere classified: Secondary | ICD-10-CM | POA: Diagnosis not present

## 2021-09-22 DIAGNOSIS — M5416 Radiculopathy, lumbar region: Secondary | ICD-10-CM | POA: Diagnosis not present

## 2021-09-22 DIAGNOSIS — M5442 Lumbago with sciatica, left side: Secondary | ICD-10-CM | POA: Diagnosis not present

## 2021-09-22 DIAGNOSIS — M5136 Other intervertebral disc degeneration, lumbar region: Secondary | ICD-10-CM | POA: Diagnosis not present

## 2021-09-22 DIAGNOSIS — M961 Postlaminectomy syndrome, not elsewhere classified: Secondary | ICD-10-CM | POA: Diagnosis not present

## 2021-09-22 DIAGNOSIS — M47816 Spondylosis without myelopathy or radiculopathy, lumbar region: Secondary | ICD-10-CM | POA: Diagnosis not present

## 2021-10-07 DIAGNOSIS — M48061 Spinal stenosis, lumbar region without neurogenic claudication: Secondary | ICD-10-CM | POA: Diagnosis not present

## 2021-10-07 DIAGNOSIS — M5416 Radiculopathy, lumbar region: Secondary | ICD-10-CM | POA: Diagnosis not present

## 2021-10-08 ENCOUNTER — Other Ambulatory Visit: Payer: Self-pay | Admitting: Adult Health

## 2021-10-08 DIAGNOSIS — T402X5A Adverse effect of other opioids, initial encounter: Secondary | ICD-10-CM

## 2021-10-14 ENCOUNTER — Ambulatory Visit (INDEPENDENT_AMBULATORY_CARE_PROVIDER_SITE_OTHER): Payer: Medicare Other | Admitting: Ophthalmology

## 2021-10-14 ENCOUNTER — Encounter (INDEPENDENT_AMBULATORY_CARE_PROVIDER_SITE_OTHER): Payer: Self-pay | Admitting: Ophthalmology

## 2021-10-14 ENCOUNTER — Encounter (INDEPENDENT_AMBULATORY_CARE_PROVIDER_SITE_OTHER): Payer: Medicare Other | Admitting: Ophthalmology

## 2021-10-14 DIAGNOSIS — H353114 Nonexudative age-related macular degeneration, right eye, advanced atrophic with subfoveal involvement: Secondary | ICD-10-CM

## 2021-10-14 DIAGNOSIS — H353124 Nonexudative age-related macular degeneration, left eye, advanced atrophic with subfoveal involvement: Secondary | ICD-10-CM

## 2021-10-14 DIAGNOSIS — H353222 Exudative age-related macular degeneration, left eye, with inactive choroidal neovascularization: Secondary | ICD-10-CM

## 2021-10-14 DIAGNOSIS — H353211 Exudative age-related macular degeneration, right eye, with active choroidal neovascularization: Secondary | ICD-10-CM

## 2021-10-14 DIAGNOSIS — H353221 Exudative age-related macular degeneration, left eye, with active choroidal neovascularization: Secondary | ICD-10-CM | POA: Diagnosis not present

## 2021-10-14 MED ORDER — BEVACIZUMAB 2.5 MG/0.1ML IZ SOSY
2.5000 mg | PREFILLED_SYRINGE | INTRAVITREAL | Status: AC | PRN
  Administered 2021-10-14: 2.5 mg via INTRAVITREAL

## 2021-10-14 NOTE — Assessment & Plan Note (Signed)
Limits acuity OS

## 2021-10-14 NOTE — Assessment & Plan Note (Signed)
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

## 2021-10-14 NOTE — Assessment & Plan Note (Signed)
Limits acuity OD ?

## 2021-10-14 NOTE — Assessment & Plan Note (Signed)
Active CME, intraretinal fluid and subretinal fluid at 12 weeks postinjection Avastin, repeat injection today need follow-up again in 7 weeks

## 2021-10-19 ENCOUNTER — Encounter: Payer: Self-pay | Admitting: Adult Health

## 2021-10-19 ENCOUNTER — Ambulatory Visit (INDEPENDENT_AMBULATORY_CARE_PROVIDER_SITE_OTHER): Payer: Medicare Other | Admitting: Adult Health

## 2021-10-19 VITALS — BP 124/60 | HR 77 | Temp 96.8°F | Wt 110.0 lb

## 2021-10-19 DIAGNOSIS — F331 Major depressive disorder, recurrent, moderate: Secondary | ICD-10-CM

## 2021-10-19 DIAGNOSIS — G8929 Other chronic pain: Secondary | ICD-10-CM

## 2021-10-19 DIAGNOSIS — F325 Major depressive disorder, single episode, in full remission: Secondary | ICD-10-CM

## 2021-10-19 DIAGNOSIS — T402X5A Adverse effect of other opioids, initial encounter: Secondary | ICD-10-CM

## 2021-10-19 DIAGNOSIS — K5903 Drug induced constipation: Secondary | ICD-10-CM

## 2021-10-19 MED ORDER — GABAPENTIN 100 MG PO CAPS: ORAL_CAPSULE | ORAL | 0 refills | Status: DC

## 2021-10-19 MED ORDER — LINACLOTIDE 72 MCG PO CAPS: ORAL_CAPSULE | ORAL | 0 refills | Status: DC

## 2021-10-19 MED ORDER — DULOXETINE HCL 20 MG PO CPEP: ORAL_CAPSULE | ORAL | 0 refills | Status: DC

## 2021-11-22 ENCOUNTER — Emergency Department (HOSPITAL_COMMUNITY)
Admission: EM | Admit: 2021-11-22 | Discharge: 2021-11-23 | Discharge status: Against Medical Advice | Payer: Medicare Other | Attending: Physician Assistant | Admitting: Physician Assistant

## 2021-11-22 ENCOUNTER — Emergency Department (HOSPITAL_COMMUNITY): Payer: Medicare Other

## 2021-11-22 ENCOUNTER — Encounter (HOSPITAL_COMMUNITY): Payer: Self-pay | Admitting: Emergency Medicine

## 2021-11-22 ENCOUNTER — Other Ambulatory Visit: Payer: Self-pay

## 2021-11-22 DIAGNOSIS — R109 Unspecified abdominal pain: Secondary | ICD-10-CM | POA: Diagnosis not present

## 2021-11-22 DIAGNOSIS — M545 Low back pain, unspecified: Secondary | ICD-10-CM | POA: Diagnosis not present

## 2021-11-22 DIAGNOSIS — R5383 Other fatigue: Secondary | ICD-10-CM | POA: Insufficient documentation

## 2021-11-22 DIAGNOSIS — R6889 Other general symptoms and signs: Secondary | ICD-10-CM | POA: Diagnosis not present

## 2021-11-22 DIAGNOSIS — R079 Chest pain, unspecified: Secondary | ICD-10-CM | POA: Diagnosis not present

## 2021-11-22 DIAGNOSIS — Z743 Need for continuous supervision: Secondary | ICD-10-CM | POA: Diagnosis not present

## 2021-11-22 DIAGNOSIS — R519 Headache, unspecified: Secondary | ICD-10-CM | POA: Diagnosis not present

## 2021-11-22 DIAGNOSIS — Z5321 Procedure and treatment not carried out due to patient leaving prior to being seen by health care provider: Secondary | ICD-10-CM | POA: Insufficient documentation

## 2021-11-22 DIAGNOSIS — R140 Abdominal distension (gaseous): Secondary | ICD-10-CM | POA: Diagnosis not present

## 2021-11-22 DIAGNOSIS — R1084 Generalized abdominal pain: Secondary | ICD-10-CM | POA: Diagnosis not present

## 2021-11-22 DIAGNOSIS — M47816 Spondylosis without myelopathy or radiculopathy, lumbar region: Secondary | ICD-10-CM | POA: Diagnosis not present

## 2021-11-22 DIAGNOSIS — R0789 Other chest pain: Secondary | ICD-10-CM | POA: Diagnosis not present

## 2021-11-22 LAB — CBG MONITORING, ED: Glucose-Capillary: 86 mg/dL (ref 70–99)

## 2021-11-22 NOTE — ED Provider Triage Note (Signed)
Emergency Medicine Provider Triage Evaluation Note  [name] , a 88 y.o. female  was evaluated in triage.  Pt complains of chest and abdominal pain.  That started today.  She reports feeling generally fatigued.  She notes that she felt gassy.  Her pain started in her chest and went up into her abdomen.  She states that she has a headache.  No trauma.  Physical Exam  BP (!) 157/75 (BP Location: Left Arm)   Pulse 85   Temp 98.6 F (37 C) (Oral)   Resp 18   Ht '5\' 3"'$  (1.6 m)   Wt 50.8 kg   SpO2 100%   BMI 19.84 kg/m  Gen:   Awake, no distress   Resp:  Normal effort  MSK:   Moves extremities without difficulty  Other:  Awake and alert, generally well appearing. Moves all extremities.   Medical Decision Making  Medically screening exam initiated at 11:30 PM.  Appropriate orders placed.  [name] was informed that the remainder of the evaluation will be completed by another provider, this initial triage assessment does not replace that evaluation, and the importance of remaining in the ED until their evaluation is complete.     Lorin Glass, [place] 11/22/21 [id number]

## 2021-11-22 NOTE — ED Triage Notes (Signed)
Patient BIB [facility] c/o lower abd pain that started this AM.  Patient reports constipation and distention.    186/86 80 HR 98%  CBG 102

## 2021-11-23 DIAGNOSIS — Z85828 Personal history of other malignant neoplasm of skin: Secondary | ICD-10-CM | POA: Diagnosis not present

## 2021-11-23 DIAGNOSIS — D485 Neoplasm of uncertain behavior of skin: Secondary | ICD-10-CM | POA: Diagnosis not present

## 2021-11-23 DIAGNOSIS — C44729 Squamous cell carcinoma of skin of left lower limb, including hip: Secondary | ICD-10-CM | POA: Diagnosis not present

## 2021-11-23 DIAGNOSIS — C44321 Squamous cell carcinoma of skin of nose: Secondary | ICD-10-CM | POA: Diagnosis not present

## 2021-11-23 DIAGNOSIS — Z08 Encounter for follow-up examination after completed treatment for malignant neoplasm: Secondary | ICD-10-CM | POA: Diagnosis not present

## 2021-11-23 DIAGNOSIS — L570 Actinic keratosis: Secondary | ICD-10-CM | POA: Diagnosis not present

## 2021-11-23 LAB — CBC WITH DIFFERENTIAL/PLATELET
Abs Immature Granulocytes: 0.03 10*3/uL (ref 0.00–0.07)
Basophils Absolute: 0 10*3/uL (ref 0.0–0.1)
Basophils Relative: 0 %
Eosinophils Absolute: 0.3 10*3/uL (ref 0.0–0.5)
Eosinophils Relative: 2 %
HCT: 38.6 % (ref 36.0–46.0)
Hemoglobin: 12.6 g/dL (ref 12.0–15.0)
Immature Granulocytes: 0 %
Lymphocytes Relative: 26 %
Lymphs Abs: 3.5 10*3/uL (ref 0.7–4.0)
MCH: 31.3 pg (ref 26.0–34.0)
MCHC: 32.6 g/dL (ref 30.0–36.0)
MCV: 95.8 fL (ref 80.0–100.0)
Monocytes Absolute: 1.3 10*3/uL — ABNORMAL HIGH (ref 0.1–1.0)
Monocytes Relative: 10 %
Neutro Abs: 8.4 10*3/uL — ABNORMAL HIGH (ref 1.7–7.7)
Neutrophils Relative %: 62 %
Platelets: 314 10*3/uL (ref 150–400)
RBC: 4.03 MIL/uL (ref 3.87–5.11)
RDW: 12.7 % (ref 11.5–15.5)
WBC: 13.6 10*3/uL — ABNORMAL HIGH (ref 4.0–10.5)
nRBC: 0 % (ref 0.0–0.2)

## 2021-11-23 LAB — COMPREHENSIVE METABOLIC PANEL
ALT: 12 U/L (ref 0–44)
AST: 20 U/L (ref 15–41)
Albumin: 3.9 g/dL (ref 3.5–5.0)
Alkaline Phosphatase: 77 U/L (ref 38–126)
Anion gap: 10 (ref 5–15)
BUN: 23 mg/dL (ref 8–23)
CO2: 26 mmol/L (ref 22–32)
Calcium: 9.2 mg/dL (ref 8.9–10.3)
Chloride: 104 mmol/L (ref 98–111)
Creatinine, Ser: 1.26 mg/dL — ABNORMAL HIGH (ref 0.44–1.00)
GFR, Estimated: 41 mL/min — ABNORMAL LOW (ref >60)
Glucose, Bld: 87 mg/dL (ref 70–99)
Potassium: 3.9 mmol/L (ref 3.5–5.1)
Sodium: 140 mmol/L (ref 135–145)
Total Bilirubin: 0.4 mg/dL (ref 0.3–1.2)
Total Protein: 7.7 g/dL (ref 6.5–8.1)

## 2021-11-23 LAB — TROPONIN I (HIGH SENSITIVITY): Troponin I (High Sensitivity): 7 ng/L (ref <18)

## 2021-11-23 LAB — LIPASE, BLOOD: Lipase: 29 U/L (ref 11–51)

## 2021-11-23 NOTE — ED Notes (Signed)
Patient LWBS

## 2021-11-23 NOTE — ED Notes (Signed)
Pt left the facility 

## 2021-11-24 ENCOUNTER — Telehealth: Payer: Self-pay

## 2021-11-24 NOTE — Telephone Encounter (Signed)
HC- Opioid induced constipation counseling. She should be doing Miralax daily or twice daily along with Linzess. See what her regimen is. Also adequate hydration and exercise   LM-11/29/21-Called pt. For general wellness check, and to educated pt. On opioid induced constipation. Spoke to patient and she reported that she is doing "okay." Asked pt. How visit w/ PCP went, pt.  stated she had a chest x-ray, EKG, labs and stated "I don't know how things are going. I have a lot of stomach pain." Asked pt. If she had been having trouble eating, Pt. Stated "I am not eating a lot I am taking miralax and a suppository to help, and that has been helping some."   Pt. informed me her current regimen is two capfuls of miralax in the morning, and pt. does suppositories at night. Pt. is not taking her Linzess. Pt. Stated her last BM was last night and it was small. Pt. Denied nausea and vomiting. Pt. Reported she is taking tramadol. Educated pt. On Opioid induced constipation listed below. Pt. Verbalized understanding. Assisted pt. By connecting her w/ PCP to obtain her lab, EKG, and x-ray results per pt. Request. Informed pt. To reach out if she needs any further assistance.   Opioid Induced Constipation  Informed pt. Constipation is a common side effect of taking opioids such as Tramadol, as they can slow down the digestive system. Provided patient with some tips that may help to avoid constipation when taking Tramadol or opioids.    Drink plenty of fluids: Opioids can dehydrate the body, so it's important to drink plenty of fluids, especially water. This can help to soften stools and make them easier to pass. . Increase fiber intake: Eating a diet high in fiber can help to prevent constipation. Foods such as fruits, vegetables, whole grains, and beans are good sources of fiber.  Stay active: Regular exercise can help to keep the digestive system moving, which can prevent constipation. Take a stool softener: Stool  softeners can help to make stools easier to pass. They work by adding moisture to the stool, which can make it softer. Colace and glycerin suppositories are examples. MiraLAX is a gentle osmotic laxative that softens the stools. Use daily to twice a day to keep stools soft. Use strong laxatives such as senna/Dulcolax sparingly: If other measures are not effective, a laxative may be necessary. These stimulate the bowels and can cause cramping. Use the least effective dose of opioid pain medication to control symptoms and wean off these medications as soon as you can.   Total time spent: 20 min.             LM-11/24/21-Called pt. To complete ED d/c protocol. Pt. Informed me she LWBS d/t short staff and long wait times. Pt. Was worried she was having heart problems. Pt. Noted she went to ED d/t feeling bloated, nauseous, and having sour belches. Pt. Tried gas-x and stated it has helped some.Pt. also had miralax two days ago w/ no relief. Informed pt. Miralax is an osmotic laxative and it can take a few days to have a BM. Pt. stated she was scared to take another dose today. Informed pt. Miralax is a safe medication when taken as directed, and that it is commonly used w/ infants.  Educated pt. On importance of staying hydrated and advised she f/u w/ PCP. Pt. Verbalized understanding and agreed for me to assist in transferring her to PCP to schedule ED f/u visit.    Total time spent:  20 min.

## 2021-11-25 ENCOUNTER — Encounter: Payer: Self-pay | Admitting: Nurse Practitioner

## 2021-11-25 ENCOUNTER — Ambulatory Visit (INDEPENDENT_AMBULATORY_CARE_PROVIDER_SITE_OTHER): Payer: Medicare Other | Admitting: Nurse Practitioner

## 2021-11-25 VITALS — BP 110/54 | HR 78 | Temp 97.7°F | Ht 63.0 in | Wt 109.0 lb

## 2021-11-25 DIAGNOSIS — R110 Nausea: Secondary | ICD-10-CM | POA: Diagnosis not present

## 2021-11-25 DIAGNOSIS — K5901 Slow transit constipation: Secondary | ICD-10-CM | POA: Diagnosis not present

## 2021-11-25 DIAGNOSIS — R1084 Generalized abdominal pain: Secondary | ICD-10-CM | POA: Diagnosis not present

## 2021-11-25 NOTE — Patient Instructions (Signed)
Bowel Obstruction A bowel obstruction is a blockage in the small or large bowel. The bowel is also called the intestine. It is a long tube that connects the stomach to the anus. When a person eats and drinks, food and fluids go from the mouth to the stomach to the small bowel. This is where most of the nutrients in the food and fluids are absorbed. After the small bowel, material passes through the large bowel for further absorption until any leftover material leaves the body as stool (feces) through the anus during a bowel movement. A bowel obstruction will prevent food and fluids from passing through the bowel as they normally do during digestion. The bowel can become partially or completely blocked. If this condition is not treated, it can be dangerous because the bowel could rupture. What are the causes? Common causes of this condition include: Scar tissue in the body (adhesions) from previous surgery or treatment with high-energy X-rays (radiation). Recent surgery. This may cause the movements of the bowel to slow down and cause food to block the intestine. Inflammatory bowel disease, such as Crohn's disease or diverticulitis. Growths or tumors. A bulging organ or tissue (hernia). Twisting of the bowel (volvulus). A swallowed object (foreign body). Slipping of a part of the bowel into another part (intussusception). What are the signs or symptoms? Symptoms of this condition include: Pain in the abdomen. Depending on the degree of obstruction, pain may be: Mild or severe. Dull cramping or sharp pain. In one area or in the entire abdomen. Nausea and vomiting. Vomit may be greenish or a yellow bile color. Bloating in the abdomen. Constipation. Being unable to pass gas. Frequent belching. Diarrhea. This may occur if the obstruction is partial and runny stool is able to leak around the obstruction. How is this diagnosed? This condition may be diagnosed based on: A physical exam. Your  medical history. Exams to look into the small intestine or the large intestine (endoscopy or colonoscopy). Imaging tests of the abdomen or pelvis, such as X-ray or CT scan. Blood or urine tests. How is this treated? Treatment for this condition depends on the cause and severity of the problem. Treatment may include: Fluids and pain medicines that are given through an IV. Your health care provider may tell you not to eat or drink if you have nausea or vomiting. A clear liquid diet. You may be asked to consume a clear liquid diet for several days. This allows the bowel to rest. Placement of a small tube (nasogastric tube) through the nose, down the throat, and into the stomach. This may relieve pain, discomfort, and nausea by removing blocked air and fluids from the stomach. It can also help the obstruction clear up faster. Surgery. This may be required if other treatments do not work. Surgery may be required for: Bowel obstruction from a hernia. This can be an emergency procedure. Scar tissue that causes frequent or severe obstructions. Follow these instructions at home: Medicines Take over-the-counter and prescription medicines only as told by your health care provider. If you were prescribed an antibiotic medicine, take it as told by your health care provider. Do not stop taking the antibiotic even if you start to feel better. General instructions Follow instructions from your health care provider about eating and drinking restrictions. You may need to avoid solid foods and drink only clear liquids until your condition improves. Return to your normal activities as told by your health care provider. Ask your health care provider what activities   are safe for you. Rest as told by your health care provider. Avoid sitting for a long time without moving. Get up to take short walks every 1-2 hours. This is important to improve blood flow and breathing. Ask for help if you feel weak or unsteady. Keep  all follow-up visits. This is important. How is this prevented? After having a bowel obstruction, you are more likely to have another. You may do the following things to prevent another obstruction: If you have a long-term (chronic) disease, pay attention to your symptoms and contact your health care provider if you have questions or concerns. Avoid becoming constipated. You may need to take these actions to prevent or treat constipation: Drink enough fluid to keep your urine pale yellow. Take over-the-counter or prescription medicines. Eat foods that are high in fiber, such as beans, whole grains, and fresh fruits and vegetables. Limit foods that are high in fat and processed sugars, such as fried or sweet foods. Stay active. Exercise for 30 minutes or more, 5 or more days each week. Ask your health care provider which exercises are safe for you. Avoid stress. Find ways to reduce stress, such as meditation, exercise, or taking time for activities that relax you. Instead of eating three large meals each day, eat three small meals with three small snacks. Work with a dietitian to make a healthy meal plan that works for you. Do not use any products that contain nicotine or tobacco. These products include cigarettes, chewing tobacco, and vaping devices, such as e-cigarettes. If you need help quitting, ask your health care provider. Contact a health care provider if: You have a fever. You have chills. Get help right away if: You have increased pain or cramping. You vomit blood. You have uncontrolled vomiting or nausea. You cannot drink fluids because of vomiting or pain. You become confused. You begin feeling very thirsty (dehydrated). You have severe bloating. You feel extremely weak or you faint. Summary A bowel obstruction is a blockage in the small or large bowel. A bowel obstruction will prevent food and fluids from passing through the bowel as they normally do during  digestion. Treatment for this condition depends on the cause and severity of the problem. It may include fluids and pain medicines through an IV, a simple diet, a nasogastric tube, or surgery. Follow instructions from your health care provider about eating restrictions. You may need to avoid solid foods and consume only clear liquids until your condition improves. This information is not intended to replace advice given to you by your health care provider. Make sure you discuss any questions you have with your health care provider. Document Revised: 05/24/2020 Document Reviewed: 05/24/2020 Elsevier Patient Education  2023 [employer]  

## 2021-11-25 NOTE — Progress Notes (Signed)
ER Follow Up  Assessment and Plan: Hospital visit follow up for:   1. Generalized abdominal pain OTC Tylenol as needed. Stay well hydrated.  2. Slow transit constipation Suggest Miralax QOD BID, stool softener daily until able to have a large bowel movement.  Stay well hydrated. Remain active. Report to ER for any increase in nausea, vomiting that consists of brown or coffee grounds, blood in stool or emesis, vomiting blood, unrelieved abdominal pain, fever, chills.  3. Nausea Continue to monitor  Will obtain abdominal x-ray for further review and evaluation.  We discussed she could be at risk for a SBO.  All signs and symptoms discussed in full.  All questions and concerns addressed.  Orders Placed This Encounter  Procedures   DG Abd 2 Views    Standing Status:   Future    Standing Expiration Date:   11/26/2022    Order Specific Question:   Reason for Exam (SYMPTOM  OR DIAGNOSIS REQUIRED)    Answer:   ABdominal pain, constipation, nausea - assess for obstruction    Order Specific Question:   Preferred imaging location?    Answer:   [id number]    All medications were reviewed with patient and family and fully reconciled. All questions answered fully, and patient and family members were encouraged to call the office with any further questions or concerns. Discussed goal to avoid readmission related to this diagnosis.    Over 20 minutes of exam, counseling, chart review, and complex, high/moderate level critical decision making was performed this visit.   Future Appointments  Date Time Provider [place]  12/02/2021 10:30 AM Rankin, Clent Demark, MD [facility] None  12/07/2021  2:00 PM Darrol Jump, NP [facility] None  12/23/2021 11:00 AM Darrol Jump, NP [facility] None  01/04/2022 11:00 AM Belton, Osa Craver, RPH [facility] None     HPI 88 y.o.female presents for follow up for transition from recent ER visit where she did not stay due and left AMA. . ER date to the  to the hospital was 11/22/21, evening patient was discharged from the hospital on 11/23/21 early morning and our clinical staff contacted the office the day after discharge to set up a follow up appointment. The discharge summary, medications, and diagnostic test results were reviewed before meeting with the patient.  Reports that she has been unable to have a "good bowel" movement, although had a "better one" yesterday.  States that she has a hx of constipation.  She had noticed increase in abdominal pain with nausea and went to ER.  She felt as though she was not receiving care, and left.  For the last 3 days she has been taking Miralax which has helped to move her bowels.  She does not take a daily stool softener.  She is on Linzess but does not take.  She is trying to stay well hydrated and remain active.  She uses a cane for walker/support.  She had a CXR completed in ER that was negative.  She had some blood work that revealed and elevated WBC.      Images while in the hospital: DG Chest 2 View  Result Date: 11/22/2021 CLINICAL DATA:  Chest and abdominal pain EXAM: CHEST - 2 VIEW COMPARISON:  Radiographs 12/21/2015 FINDINGS: No focal consolidation, pleural effusion, or pneumothorax. Normal cardiomediastinal silhouette. No acute osseous abnormality. Aortic calcification. Thoracolumbar spondylosis. IMPRESSION: No active cardiopulmonary disease. Electronically Signed   By: Placido Sou M.D.   On: 11/22/2021 23:48  Current Outpatient Medications (Endocrine & Metabolic):    Dulaglutide (TRULICITY) 1.5 BT/5.9RC SOPN, INJECT 0.5ML (1.5MG) SUBCUTANEOUSLY ONCE WEEKLY   Insulin Glargine (BASAGLAR KWIKPEN) 100 UNIT/ML, DIAL AND INJECT 30 UNITS UNDER THE SKIN ONCE DAILY AS DIRECTED  Current Outpatient Medications (Cardiovascular):    bisoprolol-hydrochlorothiazide (ZIAC) 2.5-6.25 MG tablet, Take 1 tablet by mouth daily.   rosuvastatin (CRESTOR) 20 MG tablet, Take  1 tablet  Daily for Cholesterol                                     /                              TAKE                         BYMOUTH                   ONCEDAILY  Current Outpatient Medications (Respiratory):    ipratropium (ATROVENT) 0.03 % nasal spray, Place 2 sprays into the nose 3 (three) times daily.  Current Outpatient Medications (Analgesics):    aspirin 81 MG tablet, Take 81 mg by mouth daily.   traMADol (ULTRAM) 50 MG tablet, Take 50 mg by mouth every 6 (six) hours as needed for moderate pain. Pt only takes 2 pills per night  Current Outpatient Medications (Hematological):    vitamin B-12 (CYANOCOBALAMIN) 1000 MCG tablet, Take 1,000 mcg by mouth daily.  Current Outpatient Medications (Other):    AquaLance Lancets 30G MISC, CHECK BLOOD SUGAR 3 TIMES A DAY   Blood Glucose Calibration (ACCU-CHEK AVIVA) SOLN, Check blood sugar 3 times daily-DX-E11.22   Blood Glucose Monitoring Suppl (ONETOUCH VERIO FLEX SYSTEM) w/Device KIT, Check blood sugar 3 times daily-E11.8   buPROPion (WELLBUTRIN XL) 150 MG 24 hr tablet, Take 1 tablet every Morning for Mood , Focus & Concentration   busPIRone (BUSPAR) 5 MG tablet, Take 1 tab three times a day as needed for mood/anxiety.   Cholecalciferol (VITAMIN D3) 2000 UNITS TABS, Take 1 capsule by mouth 2 (two) times daily.   COMFORT EZ PEN NEEDLES 32G X 4 MM MISC, USE 2 TIMES DAILY AS DIRECTED , 1 PEN NEEDLE PER INJECTION.   Continuous Blood Gluc Sensor (FREESTYLE LIBRE SENSOR SYSTEM) MISC, Check blood sugar 3 times a day. Dx:E11.8, Z79.4   Continuous Blood Gluc Sensor MISC, Check blood sugar 3 times a day- Dx: E11.8/Z79.4   docusate sodium (COLACE) 100 MG capsule, Take 1 capsule twice daily as needed for constipation with a bottle of water.   dorzolamide-timolol (COSOPT) 22.3-6.8 MG/ML ophthalmic solution, Place 1 drop into both eyes 2 (two) times daily.   DULoxetine (CYMBALTA) 20 MG capsule, Take 1 cap daily for mood and chronic pain.   EASY COMFORT INSULIN SYRINGE 30G X 1/2" 0.5 ML MISC,     gabapentin (NEURONTIN) 100 MG capsule, Take 1-2 caps 1 hour prior to bedtime as needed for pain and sleep.   glucose blood (ONETOUCH VERIO) test strip, USE 1 STRIP TO CHECK GLUCOSE THREE TIMES DAILY AS DIRECTED   Lancet Devices (ADJUSTABLE LANCING DEVICE) MISC, CHECK BLOOD SUGAR 3 TIMES A DAY   latanoprost (XALATAN) 0.005 % ophthalmic solution, Place 1 drop into both eyes at bedtime.   linaclotide (LINZESS) 72 MCG capsule, Start taking 1 cap daily for constipation.   methocarbamol (ROBAXIN)  500 MG tablet, Take 500 mg by mouth 4 (four) times daily.   Multiple Vitamins-Minerals (CENTRUM SILVER 50+WOMEN) TABS, Take 1 tablet by mouth daily.   Multiple Vitamins-Minerals (ICAPS AREDS 2 PO), Take 1 capsule by mouth daily.   rOPINIRole (REQUIP) 3 MG tablet, Take 1 to 2 tablets  at Bedtime  for Restless Legs                                                               /                     TAKE  BY MOUTH  Past Medical History:  Diagnosis Date   Arthritis    Chronic back pain    Cystoid macular edema of right eye 08/19/2019   Diabetic neuropathy (HCC)    Exudative age-related macular degeneration of left eye with inactive choroidal neovascularization ([place]) 11/18/2019   Hypercholesteremia    Hypertension    Liver lesion    Neuropathy    Type II or unspecified type diabetes mellitus without mention of complication, not stated as uncontrolled    Vitamin D deficiency    Wears glasses      Allergies  Allergen Reactions   Morphine And Related Other (See Comments)    fuzzy feeling    ROS: all negative except above.   Physical Exam: Filed Weights   11/25/21 1007  Weight: 109 lb (49.4 kg)   BP (!) 110/54   Pulse 78   Temp 97.7 F (36.5 C)   Ht _0  (1.6 m)   Wt 109 lb (49.4 kg)   SpO2 98%   BMI 19.31 kg/m  General Appearance: Well nourished, in no apparent distress. Eyes: PERRLA, EOMs, conjunctiva no swelling or erythema Sinuses: No Frontal/maxillary tenderness ENT/Mouth: Ext aud  canals clear, TMs without erythema, bulging. No erythema, swelling, or exudate on post pharynx.  Tonsils not swollen or erythematous. Hearing normal.  Neck: Supple, thyroid normal.  Respiratory: Respiratory effort normal, BS equal bilaterally without rales, rhonchi, wheezing or stridor.  Cardio: RRR with no MRGs. Brisk peripheral pulses without edema.  Abdomen: Tender to palpation in all quadrants. Soft, + BS LUQ, otherwise faint.  Lymphatics: Non tender without lymphadenopathy.  Musculoskeletal: Full ROM, 5/5 strength, normal gait.  Skin: Warm, dry without rashes, lesions, ecchymosis.  Neuro: Cranial nerves intact. Normal muscle tone, no cerebellar symptoms. Sensation intact.  Psych: Awake and oriented X 3, normal affect, Insight and Judgment appropriate.     Darrol Jump, NP 10:25 AM [facility]

## 2021-11-26 ENCOUNTER — Ambulatory Visit
Admission: RE | Admit: 2021-11-26 | Discharge: 2021-11-26 | Disposition: A | Discharge status: Home / Self Care | Payer: Medicare Other | Source: Ambulatory Visit | Attending: Nurse Practitioner | Admitting: Nurse Practitioner

## 2021-11-26 DIAGNOSIS — K5900 Constipation, unspecified: Secondary | ICD-10-CM | POA: Diagnosis not present

## 2021-11-26 DIAGNOSIS — R110 Nausea: Secondary | ICD-10-CM

## 2021-11-26 DIAGNOSIS — R1084 Generalized abdominal pain: Secondary | ICD-10-CM

## 2021-11-26 DIAGNOSIS — R109 Unspecified abdominal pain: Secondary | ICD-10-CM | POA: Diagnosis not present

## 2021-11-26 DIAGNOSIS — K5901 Slow transit constipation: Secondary | ICD-10-CM

## 2021-11-30 ENCOUNTER — Telehealth: Payer: Self-pay

## 2021-11-30 DIAGNOSIS — C44321 Squamous cell carcinoma of skin of nose: Secondary | ICD-10-CM | POA: Diagnosis not present

## 2021-11-30 NOTE — Telephone Encounter (Signed)
LM-11/30/21-Called pt. To assess constipation and discuss why patient is not taking Linzess as prescribed and inform patient CP can provide samples if cost is an issue. Unable to reach patient. LVM.  Total time spent: 2 min.

## 2021-12-01 NOTE — Telephone Encounter (Signed)
LM-12/01/21-Called pt. To assess constipation and discuss why patient is not taking Linzess. Pt. Answered and stated 'Yeah I don't feel like talking to you today, can you call tomorrow?" Informed pt. I would call her back tomorrow and patient hung up.  Total time spent: 2 min.

## 2021-12-02 ENCOUNTER — Encounter (INDEPENDENT_AMBULATORY_CARE_PROVIDER_SITE_OTHER): Payer: Medicare Other | Admitting: Ophthalmology

## 2021-12-02 NOTE — Telephone Encounter (Signed)
LM-12/02/21-Called pt. And asked how her BM have been. Pt. Stated she has had BM 2-3 daily that were firmer than diarrhea, but soft. Pt. Stated her bowel movements have not been hard like they have been before. Patients current regimen is taking Miralax twice daily along w/ a suppository at night. Asked pt. Why she is not taking her Linzess. Pt informed me she was unaware what Linzess was and stated she does not have the rx. Pt. requested samples from CP. Pt. Is going to be seeing Darrol Jump, NP on 8/15 and would like to get the samples on that day. Informed pt. I would inform CP. Pt. Verbalized understanding and agreed. Asked pt. If she had any further questions or concerns, pt. Informed me her blood sugar was 26 this morning prior to eating breakfast. Pt. Stated she ate sausage, eggs, and oatmeal with blueberries to help bring her blood sugar up but had not rechecked it. Pt. Denied dizziness, shakiness, or clammy skin. Pt. Admitted she felt some weakness. Pt. Stated she held her morning dose of insulin. Pt. Asked to speak to Melissa at Dr. Melford Aase office to discuss her insulin and he dosage. Pt. Declined to discuss further with me and requested to speak to [facility] and Dr. Idell Pickles office. Informed pt. Before getting her connected to reach out to me if she had any further questions or concerns, and pt. Stated "No, I will call Melissa." Got pt. Connected w/ Dr. Idell Pickles office at pts. Request.    Total time spent: 25 min.

## 2021-12-07 ENCOUNTER — Encounter: Payer: Self-pay | Admitting: Nurse Practitioner

## 2021-12-07 ENCOUNTER — Encounter: Payer: Medicare Other | Admitting: Adult Health

## 2021-12-07 ENCOUNTER — Ambulatory Visit (INDEPENDENT_AMBULATORY_CARE_PROVIDER_SITE_OTHER): Payer: Medicare Other | Admitting: Nurse Practitioner

## 2021-12-07 VITALS — BP 98/50 | HR 76 | Temp 97.9°F | Ht 63.0 in | Wt 109.8 lb

## 2021-12-07 DIAGNOSIS — Z136 Encounter for screening for cardiovascular disorders: Secondary | ICD-10-CM | POA: Diagnosis not present

## 2021-12-07 DIAGNOSIS — E1169 Type 2 diabetes mellitus with other specified complication: Secondary | ICD-10-CM | POA: Diagnosis not present

## 2021-12-07 DIAGNOSIS — G8929 Other chronic pain: Secondary | ICD-10-CM

## 2021-12-07 DIAGNOSIS — N183 Chronic kidney disease, stage 3 unspecified: Secondary | ICD-10-CM | POA: Diagnosis not present

## 2021-12-07 DIAGNOSIS — Z79899 Other long term (current) drug therapy: Secondary | ICD-10-CM | POA: Diagnosis not present

## 2021-12-07 DIAGNOSIS — E118 Type 2 diabetes mellitus with unspecified complications: Secondary | ICD-10-CM

## 2021-12-07 DIAGNOSIS — Z0001 Encounter for general adult medical examination with abnormal findings: Secondary | ICD-10-CM

## 2021-12-07 DIAGNOSIS — F331 Major depressive disorder, recurrent, moderate: Secondary | ICD-10-CM

## 2021-12-07 DIAGNOSIS — E559 Vitamin D deficiency, unspecified: Secondary | ICD-10-CM

## 2021-12-07 DIAGNOSIS — Z0000 Encounter for general adult medical examination without abnormal findings: Secondary | ICD-10-CM | POA: Diagnosis not present

## 2021-12-07 DIAGNOSIS — E1122 Type 2 diabetes mellitus with diabetic chronic kidney disease: Secondary | ICD-10-CM

## 2021-12-07 DIAGNOSIS — I10 Essential (primary) hypertension: Secondary | ICD-10-CM

## 2021-12-07 DIAGNOSIS — Z794 Long term (current) use of insulin: Secondary | ICD-10-CM

## 2021-12-07 DIAGNOSIS — I700 Atherosclerosis of aorta: Secondary | ICD-10-CM

## 2021-12-07 DIAGNOSIS — E1140 Type 2 diabetes mellitus with diabetic neuropathy, unspecified: Secondary | ICD-10-CM

## 2021-12-07 DIAGNOSIS — H353211 Exudative age-related macular degeneration, right eye, with active choroidal neovascularization: Secondary | ICD-10-CM

## 2021-12-07 DIAGNOSIS — H353221 Exudative age-related macular degeneration, left eye, with active choroidal neovascularization: Secondary | ICD-10-CM

## 2021-12-07 NOTE — Patient Instructions (Signed)

## 2021-12-07 NOTE — Progress Notes (Signed)
CPE  Assessment:    Encounter for Annual Physical Exam with abnormal findings Due annually    Atherosclerosis of aorta ([place])- per CT 06/2017 Continue Rosuvastatin Discussed lifestyle modifications. Recommended diet heavy in fruits and veggies, omega 3's. Decrease consumption of animal meats, cheeses, and dairy products. Remain active and exercise as tolerated. Continue to monitor. Check lipids/TSH   Primary hypertension Continue Ziac Discussed DASH (Dietary Approaches to Stop Hypertension) DASH diet is lower in sodium than a typical American diet. Cut back on foods that are high in saturated fat, cholesterol, and trans fats. Eat more whole-grain foods, fish, poultry, and nuts Remain active and exercise as tolerated daily.  Monitor BP at home-Call if greater than 130/80.  Check CMP/CBC   Type II DM Continue medications Education: Reviewed 'ABCs' of diabetes management  Discussed goals to be met and/or maintained include A1C (<7) Blood pressure (<130/80) Cholesterol (LDL <70) Continue Eye Exam yearly  Continue Dental Exam Q6 mo Discussed dietary recommendations Discussed Physical Activity recommendations Foot exam UTD Check A1C   Type 2 diabetes with neuropathy [facility]) Education: Reviewed 'ABCs' of diabetes management  Discussed goals to be met and/or maintained include A1C (<7) Blood pressure (<130/80) Cholesterol (LDL <70) Continue Eye Exam yearly  Continue Dental Exam Q6 mo Discussed dietary recommendations Discussed Physical Activity recommendations Foot exam UTD Check A1C   Mixed hyperlipidemia associated with T2DM (HCC) Discussed lifestyle modifications. Recommended diet heavy in fruits and veggies, omega 3's. Decrease consumption of animal meats, cheeses, and dairy products. Remain active and exercise as tolerated. Continue to monitor. Check lipids/TSH  CKD 3 associated with T2DM (HCC) Continue to follow with Nephrology Dr. Moshe Cipro   Discussed how what you eat and drink can aide in kidney protection. Stay well hydrated. Avoid high salt foods. Avoid NSAIDS. Keep BP and BG well controlled.   Take medications as prescribed. Remain active and exercise as tolerated daily. Maintain weight.  Continue to monitor. Check CMP/GFR/Microablumin   Macular degeneration ([place]) - BIL Getting injections by Dr. Zadie Rhine Continue to monitor   Chronic pain syndrome Ortho following, Monitor  Medication management All medications discussed and reviewed in full. All questions and concerns regarding medications addressed.     Vitamin D deficiency Continue to recommend supplementation for goal of 60-100 Check vitamin D level   Major depression in remission ([place])  Low dose wellbutrin for energy and motivation  Lifestyle discussed: diet/exerise, sleep hygiene, stress management, hydration  Screening for ischemic heart disease  EKG  Orders Placed This Encounter  Procedures   MICROSCOPIC MESSAGE   CBC with Differential/Platelet   COMPLETE METABOLIC PANEL WITH GFR   Magnesium   Lipid panel   Hemoglobin A1c   VITAMIN D 25 Hydroxy (Vit-D Deficiency, Fractures)   Urinalysis, Routine w reflex microscopic   Microalbumin / creatinine urine ratio   EKG 12-Lead     Notify office for further evaluation and treatment, questions or concerns if s/s fail to improve. The risks and benefits of my recommendations, as well as other treatment options were discussed with the patient today. Questions were answered.   Further disposition pending results of labs. Discussed med's effects and SE's.     Over 30 minutes of exam, counseling, chart review, and critical decision making was performed.    Future Appointments  Date Time Provider [place]  12/23/2021 11:00 AM Darrol Jump, NP [facility] None  01/04/2022  2:30 PM Carleene Mains, RPH [facility] None  12/14/2022  2:00 PM Darrol Jump, NP [facility] None  Plan:    During the course of the visit the patient was educated and counseled about appropriate screening and preventive services including:   Pneumococcal vaccine  Influenza vaccine Td vaccine Screening electrocardiogram Bone densitometry screening Colorectal cancer screening Diabetes screening Glaucoma screening Nutrition counseling  Advanced directives: requested   Subjective:   [name]  presents for CPE. She has Hypertension; Chronic pain; CKD stage 3 due to type 2 diabetes mellitus ([place]); Hyperlipidemia associated with type 2 diabetes mellitus ([place]); Vitamin D deficiency; Neuropathy due to type 2 diabetes mellitus ([place]); Medication management; Encounter for Medicare annual wellness exam; Atherosclerosis of aorta ([place]) by AbdCTscan on 06/28/2017; Type 2 diabetes mellitus with complication, with long-term current use of insulin ([place]); Major depression, recurrent ([place]); Long term (current) use of insulin ([place]); Exudative age-related macular degeneration of right eye with active choroidal neovascularization ([place]); Exudative age-related macular degeneration of left eye with active choroidal neovascularization ([place]); Advanced nonexudative age-related macular degeneration of right eye with subfoveal involvement; Exudative age-related macular degeneration of left eye with inactive choroidal neovascularization ([place]); Memory changes; Poorly controlled type 2 diabetes mellitus ([place]); Poor compliance with medication; Advanced nonexudative age-related macular degeneration of left eye with subfoveal involvement; and Frequent UTI on their problem list.  She is married, 3 sons, 8 grandchildren, 4 great grandkids. She is a retired Secretary/administrator. She enjoys gardening though less with heat this year. Denies concerns today.   She has reported unsteady gait, chronic pain, follows with [facility] (Dr. Norm Salt) and [facility], also with diabetic neuropathy. Just had injection into bil hips  yesterday.   This past year has been referred for PT for gait training, has cane and encouraged consistent use but only intermitently compliant with this. She does note PT helped and feels more stable.  She has depression with reported lack of motivation and energy, now on wellbutrin 150 mg daily and doing well.   BMI is Body mass index is 19.45 kg/m., she has been working on diet but hasn't been exercising, She could go to church and walk in their gym, plans to go with husband or friend, motivated to start now that hips feel improved. Wt Readings from Last 3 Encounters:  12/07/21 109 lb 12.8 oz (49.8 kg)  11/25/21 109 lb (49.4 kg)  11/22/21 112 lb (50.8 kg)   Patient is treated for HTN since 2000, takes ziac daily, reports losartan was d/c'd by nephrology. BP today was BP: (!) 98/50  She has aortic atherosclerosis per CT 2017 and 2019. Patient had a negative Cardiolite in 2009. Patient has had no complaints of any cardiac type chest pain, palpitations, dyspnea/orthopnea/PND, dizziness, claudication, or dependent edema.  She has hyperlipidemia and is prescribed, patient has since restarted rosuvastatin 20 mg daily. Patient denies myalgias or other med SE's. Last Lipids were not at goal  Lab Results  Component Value Date   CHOL 135 08/04/2021   HDL 55 08/04/2021   LDLCALC 59 08/04/2021   TRIG 130 08/04/2021   CHOLHDL 2.5 08/04/2021   Also, the patient has history of T2DM x 1996 and has been on insulin since 2000 and she has complications w/ CKD and Neuropathy.  Off of metformin due to CKD 3b; on bASA, statin Not on ACE/ARB per recommendation by Dr. Moshe Cipro Has intermittently seen endocrinology though not consistently, recently has been managed by our office though with suspected poor compliance with recommended diet/med regimen/appointments, last seen for diabetes 05/21/2020.   She has had no symptoms of reactive hypoglycemia,  diabetic polys or visual blurring.  She does report  tingling and occasional painful paresthesias of the feet.  She takes requip for RLS, reports hemp oil helping with foot pain.  Lab Results  Component Value Date   HGBA1C 7.4 (H) 08/04/2021   She follows with [facility] Dr. Moshe Cipro, advised to stay off of ACE/ARB (caused deterioration) and NSAIDs.  Lab Results  Component Value Date   GFRNONAA 41 (L) 11/23/2021   GFRNONAA 37 (L) 09/02/2020   GFRNONAA 35 (L) 05/21/2020   Further, the patient also has history of Vitamin D Deficiency, takes supplement Lab Results  Component Value Date   VD25OH 60 08/04/2021   She takes B12 SL, tries to take daily. Lab Results  Component Value Date   [id number] 885 12/03/2020     Current Outpatient Medications on File Prior to Visit  Medication Sig   AquaLance Lancets 30G MISC CHECK BLOOD SUGAR 3 TIMES A DAY   aspirin 81 MG tablet Take 81 mg by mouth daily.   bisoprolol-hydrochlorothiazide (ZIAC) 2.5-6.25 MG tablet Take 1 tablet by mouth daily.   Blood Glucose Calibration (ACCU-CHEK AVIVA) SOLN Check blood sugar 3 times daily-DX-E11.22   Blood Glucose Monitoring Suppl (ONETOUCH VERIO FLEX SYSTEM) w/Device KIT Check blood sugar 3 times daily-E11.8   buPROPion (WELLBUTRIN XL) 150 MG 24 hr tablet Take 1 tablet every Morning for Mood , Focus & Concentration   busPIRone (BUSPAR) 5 MG tablet Take 1 tab three times a day as needed for mood/anxiety.   Cholecalciferol (VITAMIN D3) 2000 UNITS TABS Take 1 capsule by mouth 2 (two) times daily.   COMFORT EZ PEN NEEDLES 32G X 4 MM MISC USE 2 TIMES DAILY AS DIRECTED , 1 PEN NEEDLE PER INJECTION.   Continuous Blood Gluc Sensor (FREESTYLE LIBRE SENSOR SYSTEM) MISC Check blood sugar 3 times a day. Dx:E11.8, Z79.4   Continuous Blood Gluc Sensor MISC Check blood sugar 3 times a day- Dx: E11.8/Z79.4   docusate sodium (COLACE) 100 MG capsule Take 1 capsule twice daily as needed for constipation with a bottle of water.   dorzolamide-timolol (COSOPT) 22.3-6.8  MG/ML ophthalmic solution Place 1 drop into both eyes 2 (two) times daily.   Dulaglutide (TRULICITY) 1.5 OY/7.7AJ SOPN INJECT 0.5ML (1.5MG) SUBCUTANEOUSLY ONCE WEEKLY   DULoxetine (CYMBALTA) 20 MG capsule Take 1 cap daily for mood and chronic pain.   EASY COMFORT INSULIN SYRINGE 30G X 1/2" 0.5 ML MISC    gabapentin (NEURONTIN) 100 MG capsule Take 1-2 caps 1 hour prior to bedtime as needed for pain and sleep.   glucose blood (ONETOUCH VERIO) test strip USE 1 STRIP TO CHECK GLUCOSE THREE TIMES DAILY AS DIRECTED   Insulin Glargine (BASAGLAR KWIKPEN) 100 UNIT/ML DIAL AND INJECT 30 UNITS UNDER THE SKIN ONCE DAILY AS DIRECTED   ipratropium (ATROVENT) 0.03 % nasal spray Place 2 sprays into the nose 3 (three) times daily.   Lancet Devices (ADJUSTABLE LANCING DEVICE) MISC CHECK BLOOD SUGAR 3 TIMES A DAY   latanoprost (XALATAN) 0.005 % ophthalmic solution Place 1 drop into both eyes at bedtime.   linaclotide (LINZESS) 72 MCG capsule Start taking 1 cap daily for constipation.   methocarbamol (ROBAXIN) 500 MG tablet Take 500 mg by mouth 4 (four) times daily.   Multiple Vitamins-Minerals (CENTRUM SILVER 50+WOMEN) TABS Take 1 tablet by mouth daily.   Multiple Vitamins-Minerals (ICAPS AREDS 2 PO) Take 1 capsule by mouth daily.   rOPINIRole (REQUIP) 3 MG tablet Take 1 to 2 tablets  at Bedtime  for Restless Legs                                                               /                     TAKE  BY MOUTH   rosuvastatin (CRESTOR) 20 MG tablet Take  1 tablet  Daily for Cholesterol                                    /                              TAKE                         BYMOUTH                   ONCEDAILY   traMADol (ULTRAM) 50 MG tablet Take 50 mg by mouth every 6 (six) hours as needed for moderate pain. Pt only takes 2 pills per night   vitamin B-12 (CYANOCOBALAMIN) 1000 MCG tablet Take 1,000 mcg by mouth daily.   No current facility-administered medications on file prior to visit.   Patient Active  Problem List   Diagnosis Date Noted   Frequent UTI 02/10/2020   Advanced nonexudative age-related macular degeneration of left eye with subfoveal involvement 01/08/2020   Memory changes 12/31/2019   Poorly controlled type 2 diabetes mellitus ([place]) 12/31/2019   Poor compliance with medication 12/31/2019   Advanced nonexudative age-related macular degeneration of right eye with subfoveal involvement 11/18/2019   Exudative age-related macular degeneration of left eye with inactive choroidal neovascularization ([place]) 11/18/2019   Exudative age-related macular degeneration of right eye with active choroidal neovascularization ([place]) 08/19/2019   Exudative age-related macular degeneration of left eye with active choroidal neovascularization ([place]) 08/19/2019   Long term (current) use of insulin ([place]) 06/10/2019   Major depression, recurrent ([place]) 12/20/2018   Type 2 diabetes mellitus with complication, with long-term current use of insulin ([place]) 02/09/2017   Atherosclerosis of aorta ([place]) by AbdCTscan on 06/28/2017 11/06/2016   Encounter for Medicare annual wellness exam 05/18/2015   Medication management 08/06/2013   Neuropathy due to type 2 diabetes mellitus ([place]) 06/04/2013   CKD stage 3 due to type 2 diabetes mellitus ([place])    Hyperlipidemia associated with type 2 diabetes mellitus ([place])    Vitamin D deficiency    Hypertension 07/29/2012   Chronic pain 07/29/2012    Screening Tests  Immunization History  Administered Date(s) Administered   DT (Pediatric) 01/04/2011   Influenza Split 01/23/2013   Influenza, High Dose Seasonal PF 02/27/2014, 03/11/2015, 12/21/2015, 01/30/2017, 02/08/2018, 01/02/2019, 01/29/2021   Influenza-Unspecified 02/03/2020   PFIZER(Purple Top)SARS-COV-2 Vaccination 07/22/2019, 08/13/2019   Pneumococcal Conjugate-13 02/27/2014   Pneumococcal Polysaccharide-23 01/06/2012   Preventative care: Last colonoscopy: 2006 will not get another due to age [place] 12/2019,  declines further work up [place] 2007 declines  Tetanus 2012 Flu - due 01/2022 Pneumo 2013 Prevnar 13 2015 Shingles vaccine declines Covid 19: 2/2, 2021, pfizer + 2 boosters  Names of Other Physician/Practitioners you currently use:  1. [facility] here for primary care 2. [facility], eye doctor, last visit in 09/24/2020, no retinopathy, report abstracted,  3. Dr Sissy Hoff, DDS, dentist, last visit 2022, goes q11m Patient Care Team: MUnk Pinto MD as PCP - General (Internal Medicine) GWarden Fillers MD as Consulting Physician (Optometry) RZadie Rhine GClent Demark MD as Consulting Physician (Ophthalmology) RGlenna Fellows MD as Attending Physician (Neurosurgery) DMelrose Nakayama MD as Consulting Physician (Orthopedic Surgery) JMilus Banister MD as Attending Physician (Gastroenterology) ANewton Pigg RNorthwest [facility] Endoscopy Centeras Pharmacist (Pharmacist)   Allergies Allergies  Allergen Reactions   Morphine And Related Other (See Comments)    fuzzy feeling    SURGICAL HISTORY She  has a past surgical history that includes Abdominal hysterectomy; Cesarean section; Back surgery (1993); Appendectomy; Colonoscopy; Eye surgery; Trigger finger release (01/10/2012); and Breast excisional biopsy (Right, 1967). FAMILY HISTORY Her family history includes Diabetes in her brother and sister; Kidney cancer in her brother; Liver cancer in her sister; Liver disease in her sister; Lymphoma in her sister; Stroke in her brother. SOCIAL HISTORY She  reports that she has never smoked. She has never used smokeless tobacco. She reports that she does not drink alcohol and does not use drugs.  Review of Systems  Constitutional:  Negative for malaise/fatigue and weight loss.  HENT:  Negative for hearing loss and tinnitus.   Eyes:  Negative for blurred vision and double vision.  Respiratory:  Negative for cough, shortness of breath and wheezing.   Cardiovascular:  Negative for  chest pain, palpitations, orthopnea, claudication and leg swelling.  Gastrointestinal:  Negative for abdominal pain, blood in stool, constipation, diarrhea, heartburn, melena, nausea and vomiting.  Genitourinary: Negative.   Musculoskeletal:  Positive for back pain (ortho/neuro following) and joint pain (hips, improved after injections). Negative for myalgias.  Skin:  Negative for rash.  Neurological:  Negative for dizziness, tingling, sensory change, weakness and headaches.  Endo/Heme/Allergies:  Negative for polydipsia.  Psychiatric/Behavioral: Negative.  Negative for depression, memory loss and substance abuse. The patient is not nervous/anxious and does not have insomnia.   All other systems reviewed and are negative.    Objective:     Blood pressure (!) 98/50, pulse 76, temperature 97.9 F (36.6 C), height 5' 3"  (1.6 m), weight 109 lb 12.8 oz (49.8 kg), SpO2 98 %.  General Appearance: Well nourished, alert, WD/WN, female elder  in no apparent distress. Eyes: PERRLA, EOMs, conjunctiva no swelling or erythema Sinuses: No frontal/maxillary tenderness ENT/Mouth: EACs patent / TMs  nl. Nares clear without erythema, swelling, mucoid exudates. Oral hygiene is good. No erythema, swelling, or exudate. Tongue normal, non-obstructing. Tonsils not swollen or erythematous. Hearing normal.  Neck: Supple, thyroid normal without palpable nodules. No bruits, nodes or JVD. Respiratory: Respiratory effort normal.  BS equal and clear bilateral without rales, rhonci, wheezing or stridor. Cardio: Heart sounds are normal with regular rate and rhythm and no murmurs, rubs or gallops. Peripheral pulses are normal and equal bilaterally without edema. No aortic or femoral bruits. Chest: symmetric with normal excursions and percussion. Breasts: Breasts: symmetric fibrous changes in both upper outer quadrants, dense and irregular throughout. No rash, superficial texture change, nipples symmetrical without retraction  or discharge. Abdomen: Flat, soft  with nl bowel sounds. Nontender, no guarding, rebound, hernias, masses, or organomegaly.  Lymphatics: Non tender without lymphadenopathy.  Musculoskeletal: Full ROM all peripheral extremities, joint stability, 5/5 strength, and slow steady gait.  Skin: warm/dry intact Neuro: Cranial nerves intact. DTR's UE  Nl/Equal and DTR's LE's absent. Normal muscle tone, no cerebellar symptoms. Sensation decreased R foot to monofilament.  Pysch: Alert and oriented X 3, normal affect, Insight and Judgment are fair GU: defer, no concerns  EKG: WNL, NSCPT  The patient's weight, height, BMI  have been recorded in the chart.  I have made referrals, counseling, and provided education to the patient based on review of the above and I have provided the patient with a written personalized care plan for preventive services.  Over 40 minutes of exam, counseling, chart review was performed.  Darrol Jump, NP   12/07/2021

## 2021-12-08 LAB — URINALYSIS, ROUTINE W REFLEX MICROSCOPIC
Bilirubin Urine: NEGATIVE
Glucose, UA: NEGATIVE
Hgb urine dipstick: NEGATIVE
Ketones, ur: NEGATIVE
Nitrite: NEGATIVE
Specific Gravity, Urine: 1.013 (ref 1.001–1.035)
WBC, UA: >=60 /HPF — AB (ref 0–5)
pH: 5.5 (ref 5.0–8.0)

## 2021-12-08 LAB — COMPLETE METABOLIC PANEL WITH GFR
AG Ratio: 1.3 (calc) (ref 1.0–2.5)
ALT: 7 U/L (ref 6–29)
AST: 16 U/L (ref 10–35)
Albumin: 4 g/dL (ref 3.6–5.1)
Alkaline phosphatase (APISO): 84 U/L (ref 37–153)
BUN/Creatinine Ratio: 14 (calc) (ref 6–22)
BUN: 21 mg/dL (ref 7–25)
CO2: 31 mmol/L (ref 20–32)
Calcium: 9.8 mg/dL (ref 8.6–10.4)
Chloride: 100 mmol/L (ref 98–110)
Creat: 1.48 mg/dL — ABNORMAL HIGH (ref 0.60–0.95)
Globulin: 3.1 g/dL (calc) (ref 1.9–3.7)
Glucose, Bld: 198 mg/dL — ABNORMAL HIGH (ref 65–99)
Potassium: 5.4 mmol/L — ABNORMAL HIGH (ref 3.5–5.3)
Sodium: 139 mmol/L (ref 135–146)
Total Bilirubin: 0.4 mg/dL (ref 0.2–1.2)
Total Protein: 7.1 g/dL (ref 6.1–8.1)
eGFR: 34 mL/min/{1.73_m2} — ABNORMAL LOW (ref >=60)

## 2021-12-08 LAB — CBC WITH DIFFERENTIAL/PLATELET
Absolute Monocytes: 818 cells/uL (ref 200–950)
Basophils Absolute: 37 cells/uL (ref 0–200)
Basophils Relative: 0.4 %
Eosinophils Absolute: 781 cells/uL — ABNORMAL HIGH (ref 15–500)
Eosinophils Relative: 8.4 %
HCT: 34.7 % — ABNORMAL LOW (ref 35.0–45.0)
Hemoglobin: 11.6 g/dL — ABNORMAL LOW (ref 11.7–15.5)
Lymphs Abs: 3339 cells/uL (ref 850–3900)
MCH: 31.7 pg (ref 27.0–33.0)
MCHC: 33.4 g/dL (ref 32.0–36.0)
MCV: 94.8 fL (ref 80.0–100.0)
MPV: 11.7 fL (ref 7.5–12.5)
Monocytes Relative: 8.8 %
Neutro Abs: 4325 cells/uL (ref 1500–7800)
Neutrophils Relative %: 46.5 %
Platelets: 313 10*3/uL (ref 140–400)
RBC: 3.66 10*6/uL — ABNORMAL LOW (ref 3.80–5.10)
RDW: 11.9 % (ref 11.0–15.0)
Total Lymphocyte: 35.9 %
WBC: 9.3 10*3/uL (ref 3.8–10.8)

## 2021-12-08 LAB — HEMOGLOBIN A1C
Hgb A1c MFr Bld: 7 % of total Hgb — ABNORMAL HIGH (ref <5.7)
Mean Plasma Glucose: 154 mg/dL
eAG (mmol/L): 8.5 mmol/L

## 2021-12-08 LAB — LIPID PANEL
Cholesterol: 138 mg/dL (ref <200)
HDL: 48 mg/dL — ABNORMAL LOW (ref >=50)
LDL Cholesterol (Calc): 67 mg/dL (calc)
Non-HDL Cholesterol (Calc): 90 mg/dL (calc) (ref <130)
Total CHOL/HDL Ratio: 2.9 (calc) (ref <5.0)
Triglycerides: 152 mg/dL — ABNORMAL HIGH (ref <150)

## 2021-12-08 LAB — MICROSCOPIC MESSAGE

## 2021-12-08 LAB — MAGNESIUM: Magnesium: 2.1 mg/dL (ref 1.5–2.5)

## 2021-12-08 LAB — MICROALBUMIN / CREATININE URINE RATIO
Creatinine, Urine: 129 mg/dL (ref 20–275)
Microalb Creat Ratio: 29 mcg/mg creat (ref <30)
Microalb, Ur: 3.8 mg/dL

## 2021-12-08 LAB — VITAMIN D 25 HYDROXY (VIT D DEFICIENCY, FRACTURES): Vit D, 25-Hydroxy: 60 ng/mL (ref 30–100)

## 2021-12-09 ENCOUNTER — Other Ambulatory Visit: Payer: Self-pay | Admitting: Nurse Practitioner

## 2021-12-09 DIAGNOSIS — R8271 Bacteriuria: Secondary | ICD-10-CM

## 2021-12-09 DIAGNOSIS — D649 Anemia, unspecified: Secondary | ICD-10-CM

## 2021-12-09 DIAGNOSIS — E875 Hyperkalemia: Secondary | ICD-10-CM

## 2021-12-10 ENCOUNTER — Telehealth: Payer: Self-pay

## 2021-12-10 ENCOUNTER — Emergency Department (HOSPITAL_COMMUNITY)
Admission: EM | Admit: 2021-12-10 | Discharge: 2021-12-10 | Disposition: A | Discharge status: Home / Self Care | Payer: Medicare Other | Attending: Emergency Medicine | Admitting: Emergency Medicine

## 2021-12-10 ENCOUNTER — Encounter (HOSPITAL_COMMUNITY): Payer: Self-pay

## 2021-12-10 ENCOUNTER — Emergency Department (HOSPITAL_COMMUNITY): Payer: Medicare Other

## 2021-12-10 DIAGNOSIS — I639 Cerebral infarction, unspecified: Secondary | ICD-10-CM | POA: Diagnosis not present

## 2021-12-10 DIAGNOSIS — R110 Nausea: Secondary | ICD-10-CM | POA: Diagnosis not present

## 2021-12-10 DIAGNOSIS — Z7982 Long term (current) use of aspirin: Secondary | ICD-10-CM | POA: Diagnosis not present

## 2021-12-10 DIAGNOSIS — R112 Nausea with vomiting, unspecified: Secondary | ICD-10-CM | POA: Insufficient documentation

## 2021-12-10 DIAGNOSIS — R519 Headache, unspecified: Secondary | ICD-10-CM | POA: Diagnosis not present

## 2021-12-10 DIAGNOSIS — R42 Dizziness and giddiness: Secondary | ICD-10-CM | POA: Insufficient documentation

## 2021-12-10 DIAGNOSIS — Z79899 Other long term (current) drug therapy: Secondary | ICD-10-CM | POA: Diagnosis not present

## 2021-12-10 DIAGNOSIS — R404 Transient alteration of awareness: Secondary | ICD-10-CM | POA: Diagnosis not present

## 2021-12-10 DIAGNOSIS — H3530 Unspecified macular degeneration: Secondary | ICD-10-CM | POA: Insufficient documentation

## 2021-12-10 DIAGNOSIS — I6782 Cerebral ischemia: Secondary | ICD-10-CM | POA: Insufficient documentation

## 2021-12-10 DIAGNOSIS — R6889 Other general symptoms and signs: Secondary | ICD-10-CM | POA: Diagnosis not present

## 2021-12-10 LAB — URINALYSIS, ROUTINE W REFLEX MICROSCOPIC
Bilirubin Urine: NEGATIVE
Glucose, UA: NEGATIVE mg/dL
Hgb urine dipstick: NEGATIVE
Ketones, ur: NEGATIVE mg/dL
Nitrite: NEGATIVE
Protein, ur: NEGATIVE mg/dL
Specific Gravity, Urine: 1.006 (ref 1.005–1.030)
pH: 7 (ref 5.0–8.0)

## 2021-12-10 LAB — BASIC METABOLIC PANEL
Anion gap: 5 (ref 5–15)
BUN: 20 mg/dL (ref 8–23)
CO2: 29 mmol/L (ref 22–32)
Calcium: 9.5 mg/dL (ref 8.9–10.3)
Chloride: 104 mmol/L (ref 98–111)
Creatinine, Ser: 1.3 mg/dL — ABNORMAL HIGH (ref 0.44–1.00)
GFR, Estimated: 39 mL/min — ABNORMAL LOW (ref >60)
Glucose, Bld: 122 mg/dL — ABNORMAL HIGH (ref 70–99)
Potassium: 4.9 mmol/L (ref 3.5–5.1)
Sodium: 138 mmol/L (ref 135–145)

## 2021-12-10 LAB — CBC WITH DIFFERENTIAL/PLATELET
Abs Immature Granulocytes: 0.03 10*3/uL (ref 0.00–0.07)
Basophils Absolute: 0 10*3/uL (ref 0.0–0.1)
Basophils Relative: 0 %
Eosinophils Absolute: 0.6 10*3/uL — ABNORMAL HIGH (ref 0.0–0.5)
Eosinophils Relative: 6 %
HCT: 37.1 % (ref 36.0–46.0)
Hemoglobin: 12.3 g/dL (ref 12.0–15.0)
Immature Granulocytes: 0 %
Lymphocytes Relative: 19 %
Lymphs Abs: 1.9 10*3/uL (ref 0.7–4.0)
MCH: 31.6 pg (ref 26.0–34.0)
MCHC: 33.2 g/dL (ref 30.0–36.0)
MCV: 95.4 fL (ref 80.0–100.0)
Monocytes Absolute: 0.8 10*3/uL (ref 0.1–1.0)
Monocytes Relative: 8 %
Neutro Abs: 6.8 10*3/uL (ref 1.7–7.7)
Neutrophils Relative %: 67 %
Platelets: 318 10*3/uL (ref 150–400)
RBC: 3.89 MIL/uL (ref 3.87–5.11)
RDW: 12.4 % (ref 11.5–15.5)
WBC: 10.2 10*3/uL (ref 4.0–10.5)
nRBC: 0 % (ref 0.0–0.2)

## 2021-12-10 MED ORDER — MECLIZINE HCL 25 MG PO TABS: 12.5000 mg | ORAL_TABLET | Freq: Three times a day (TID) | ORAL | 0 refills | Status: DC | PRN

## 2021-12-10 MED ORDER — SODIUM CHLORIDE 0.9 % IV BOLUS
500.0000 mL | Freq: Once | INTRAVENOUS | Status: AC
  Administered 2021-12-10: 500 mL via INTRAVENOUS

## 2021-12-10 MED ORDER — MECLIZINE HCL 25 MG PO TABS
25.0000 mg | ORAL_TABLET | Freq: Once | ORAL | Status: AC
  Administered 2021-12-10: 25 mg via ORAL
  Filled 2021-12-10: qty 1

## 2021-12-10 NOTE — Discharge Instructions (Signed)
You were seen in the emergency department for dizziness unsteadiness.  You had lab work urinalysis and an MRI of your brain that did not show an obvious stroke.  There was possible signs of urine infection and your urine was sent for culture.  We are treating you with meclizine which may help your symptoms.  Please stay well-hydrated and move slower, use a cane and walker for support.  Follow-up with your regular doctor.  Return to the emergency department if any worsening or concerning symptoms.

## 2021-12-10 NOTE — ED Provider Notes (Signed)
[facility] DEPT Provider Note   CSN: [id number] Arrival date & time: 12/10/21  1319     History {Add pertinent medical, surgical, social history, OB history to HPI:1} Chief Complaint  Patient presents with   Dizziness    [name] is a 88 y.o. female.  She lives at home with her husband.  She said she has macular degeneration so does not see very well at baseline and usually uses a cane to walk.  For the last 2 days she has been feeling very dizzy swimmy headed and feels things are spinning.  It is giving her headache and she is felt very afraid to move because she is afraid she might fall.  No numbness or weakness.  She did have nausea and 1 episode of vomiting.  No urinary symptoms.  No prior history of vertigo and no recent head injuries.  The history is provided by the patient.  Dizziness Quality:  Head spinning, lightheadedness and imbalance Severity:  Moderate Onset quality:  Gradual Duration:  2 days Timing:  Intermittent Progression:  Unchanged Chronicity:  New Context: eye movement, head movement and standing up   Relieved by:  Being still Worsened by:  Movement Ineffective treatments:  None tried Associated symptoms: headaches, nausea and vomiting   Associated symptoms: no chest pain, no diarrhea, no hearing loss, no shortness of breath, no syncope, no tinnitus, no vision changes and no weakness   Risk factors: no new medications        Home Medications Prior to Admission medications   Medication Sig Start Date End Date Taking? Authorizing Provider  AquaLance Lancets 30G MISC CHECK BLOOD SUGAR 3 TIMES A DAY 04/06/21   Alycia Rossetti, NP  aspirin 81 MG tablet Take 81 mg by mouth daily.    [provider]  bisoprolol-hydrochlorothiazide (ZIAC) 2.5-6.25 MG tablet Take 1 tablet by mouth daily.    [provider]  Blood Glucose Calibration (ACCU-CHEK AVIVA) SOLN Check blood sugar 3 times daily-DX-E11.22 03/11/15    [name], MD  Blood Glucose Monitoring Suppl ([place]) w/Device KIT Check blood sugar 3 times daily-E11.8 08/18/20   [name], MD  buPROPion (WELLBUTRIN XL) 150 MG 24 hr tablet Take 1 tablet every Morning for Mood , Focus & Concentration 03/28/19   [name], MD  busPIRone (BUSPAR) 5 MG tablet Take 1 tab three times a day as needed for mood/anxiety. 08/31/21   Liane Comber, NP  Cholecalciferol (VITAMIN D3) 2000 UNITS TABS Take 1 capsule by mouth 2 (two) times daily.    [provider]  COMFORT EZ PEN NEEDLES 32G X 4 MM MISC USE 2 TIMES DAILY AS DIRECTED , 1 PEN NEEDLE PER INJECTION. 01/14/19   [name], MD  Continuous Blood Gluc Sensor (FREESTYLE LIBRE SENSOR SYSTEM) MISC Check blood sugar 3 times a day. Dx:E11.8, Z79.4 12/26/19   Liane Comber, NP  Continuous Blood Gluc Sensor MISC Check blood sugar 3 times a day- Dx: E11.8/Z79.4 05/09/18   Liane Comber, NP  docusate sodium (COLACE) 100 MG capsule Take 1 capsule twice daily as needed for constipation with a bottle of water. 08/26/21   Liane Comber, NP  dorzolamide-timolol (COSOPT) 22.3-6.8 MG/ML ophthalmic solution Place 1 drop into both eyes 2 (two) times daily. 03/31/16   [provider]  Dulaglutide (TRULICITY) 1.5 RK/2.7CW SOPN INJECT 0.5ML (1.5MG) SUBCUTANEOUSLY ONCE WEEKLY 03/29/21   Liane Comber, NP  DULoxetine (CYMBALTA) 20 MG capsule Take 1 cap daily for mood and  chronic pain. 10/19/21   Liane Comber, NP  EASY COMFORT INSULIN SYRINGE 30G X 1/2" 0.5 ML MISC  10/01/15   [provider]  gabapentin (NEURONTIN) 100 MG capsule Take 1-2 caps 1 hour prior to bedtime as needed for pain and sleep. 10/19/21   Liane Comber, NP  glucose blood (ONETOUCH VERIO) test strip USE 1 STRIP TO CHECK GLUCOSE THREE TIMES DAILY AS DIRECTED 05/04/21   Liane Comber, NP  Insulin Glargine (BASAGLAR KWIKPEN) 100 UNIT/ML DIAL AND INJECT 30 UNITS UNDER THE SKIN ONCE DAILY AS DIRECTED  05/18/21   Liane Comber, NP  ipratropium (ATROVENT) 0.03 % nasal spray Place 2 sprays into the nose 3 (three) times daily. 06/22/21 06/22/22  Alycia Rossetti, NP  Lancet Devices (ADJUSTABLE LANCING DEVICE) MISC CHECK BLOOD SUGAR 3 TIMES A DAY 09/07/20   [name], MD  latanoprost (XALATAN) 0.005 % ophthalmic solution Place 1 drop into both eyes at bedtime. 03/28/16   [provider]  linaclotide Rolan Lipa) 72 MCG capsule Start taking 1 cap daily for constipation. 10/19/21   Liane Comber, NP  methocarbamol (ROBAXIN) 500 MG tablet Take 500 mg by mouth 4 (four) times daily.    [provider]  Multiple Vitamins-Minerals (CENTRUM SILVER 50+WOMEN) TABS Take 1 tablet by mouth daily.    [provider]  Multiple Vitamins-Minerals (ICAPS AREDS 2 PO) Take 1 capsule by mouth daily.    [provider]  rOPINIRole (REQUIP) 3 MG tablet Take 1 to 2 tablets  at Bedtime  for Restless Legs                                                               /                     TAKE  BY MOUTH 04/21/21   [name], MD  rosuvastatin (CRESTOR) 20 MG tablet Take  1 tablet  Daily for Cholesterol                                    /                              TAKE                         BYMOUTH                   ONCEDAILY 07/27/21   [name], MD  traMADol (ULTRAM) 50 MG tablet Take 50 mg by mouth every 6 (six) hours as needed for moderate pain. Pt only takes 2 pills per night    [provider]  vitamin B-12 (CYANOCOBALAMIN) 1000 MCG tablet Take 1,000 mcg by mouth daily.    [provider]      Allergies    Morphine and related    Review of Systems   Review of Systems  Constitutional:  Negative for fever.  HENT:  Negative for hearing loss and tinnitus.   Eyes:  Positive for visual disturbance (baseline).  Respiratory:  Negative for shortness of breath.   Cardiovascular:  Negative for chest  pain and syncope.  Gastrointestinal:  Positive for  nausea and vomiting. Negative for diarrhea.  Genitourinary:  Negative for dysuria.  Musculoskeletal:  Negative for neck pain.  Neurological:  Positive for dizziness and headaches. Negative for weakness.    Physical Exam Updated Vital Signs BP (!) 192/84 (BP Location: Left Arm)   Pulse 68   Temp 97.7 F (36.5 C) (Oral)   Resp 18   SpO2 100%  Physical Exam Vitals and nursing note reviewed.  Constitutional:      General: She is not in acute distress.    Appearance: Normal appearance. She is well-developed.  HENT:     Head: Normocephalic and atraumatic.  Eyes:     Extraocular Movements: Extraocular movements intact.     Conjunctiva/sclera: Conjunctivae normal.     Pupils: Pupils are equal, round, and reactive to light.  Cardiovascular:     Rate and Rhythm: Normal rate and regular rhythm.     Heart sounds: No murmur heard. Pulmonary:     Effort: Pulmonary effort is normal. No respiratory distress.     Breath sounds: Normal breath sounds.  Abdominal:     Palpations: Abdomen is soft.     Tenderness: There is no abdominal tenderness.  Musculoskeletal:        General: Normal range of motion.     Cervical back: Neck supple.     Right lower leg: No edema.     Left lower leg: No edema.  Skin:    General: Skin is warm and dry.     Capillary Refill: Capillary refill takes less than 2 seconds.  Neurological:     General: No focal deficit present.     Mental Status: She is alert and oriented to person, place, and time.     Cranial Nerves: No cranial nerve deficit.     Sensory: No sensory deficit.     Motor: No weakness.  Psychiatric:        Mood and Affect: Mood normal.     ED Results / Procedures / Treatments   Labs (all labs ordered are listed, but only abnormal results are displayed) Labs Reviewed  BASIC METABOLIC PANEL - Abnormal; Notable for the following components:      Result Value   Glucose, Bld 122 (*)    Creatinine, Ser 1.30 (*)    GFR, Estimated 39 (*)    All  other components within normal limits  CBC WITH DIFFERENTIAL/PLATELET - Abnormal; Notable for the following components:   Eosinophils Absolute 0.6 (*)    All other components within normal limits  URINALYSIS, ROUTINE W REFLEX MICROSCOPIC    EKG None  Radiology MR BRAIN WO CONTRAST  Result Date: 12/10/2021 CLINICAL DATA:  Neuro deficit, acute, stroke suspected Dizziness, non-specific EXAM: MRI HEAD WITHOUT CONTRAST TECHNIQUE: Multiplanar, multiecho pulse sequences of the brain and surrounding structures were obtained without intravenous contrast. COMPARISON:  None Available. FINDINGS: Brain: There is no acute infarction or intracranial hemorrhage. There is no intracranial mass, mass effect, or edema. There is no hydrocephalus or extra-axial fluid collection. Prominence of the ventricles sulci reflects generalized parenchymal volume loss. Patchy and confluent areas of T2 hyperintensity in the supratentorial and pontine white matter are nonspecific but may reflect moderate chronic microvascular ischemic changes. 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 Small chronic left cerebellar infarcts. These findings have progressed since 2014. Vascular: Major vessel flow voids at the skull base are preserved. Skull and upper cervical spine: Normal marrow  signal is preserved. Sinuses/Orbits: Paranasal sinuses are aerated. Bilateral lens replacements. Other: Sella is unremarkable.  Mastoid air cells are clear. IMPRESSION: No acute infarction, hemorrhage, or mass. Moderate chronic microvascular ischemic changes. Electronically Signed   By: Macy Mis M.D.   On: 12/10/2021 17:04    Procedures Procedures  {Document cardiac monitor, telemetry assessment procedure when appropriate:1}  Medications Ordered in ED Medications  sodium chloride 0.9 % bolus 500 mL (has no administration in time range)  meclizine (ANTIVERT) tablet 25 mg (25 mg Oral Given 12/10/21 2114)    ED Course/  Medical Decision Making/ A&P                           Medical Decision Making  ***  {Document critical care time when appropriate:1} {Document review of labs and clinical decision tools ie heart score, Chads2Vasc2 etc:1}  {Document your independent review of radiology images, and any outside records:1} {Document your discussion with family members, caretakers, and with consultants:1} {Document social determinants of health affecting pt's care:1} {Document your decision making why or why not admission, treatments were needed:1} Final Clinical Impression(s) / ED Diagnoses Final diagnoses:  None    Rx / DC Orders ED Discharge Orders     None

## 2021-12-10 NOTE — ED Triage Notes (Signed)
Pt arrived via EMS, from home, having episodes of dizziness.

## 2021-12-10 NOTE — Telephone Encounter (Signed)
I spoke to patient's husband who reports that the patient is experiencing dizziness, fatigue and just does not feel well. He reports her blood sugar is 96 and her blood pressure is 178/78, but did not report a pulse rate. Based on the patient's recent lab work and recommendations the patient was to come in for revaluation. However, due to her current symptoms her husband was advised to contact 911 for evaluation and treatment. He was instructed to please contact this clinic with any further questions or concerns. The patient's husband voiced verbal understanding to this staff member.

## 2021-12-10 NOTE — ED Provider Triage Note (Signed)
Emergency Medicine Provider Triage Evaluation Note  [name] , a 88 y.o. female  was evaluated in triage.  Pt complains of dizziness, headache, nausea and vomiting.  Dizziness started yesterday around lunchtime.  It has been constant, it is worse with movement but never completely resolves even with rest.  She reports an associated headache.  Has chronic visual changes due to macular degeneration but denies new visual changes.  Reports that she feels a little weak in her legs and feels unsteady but does not have unilateral weakness or numbness..  Review of Systems  Positive: Dizziness, headache, nausea, vomiting Negative: Chest pain, shortness of breath, abdominal pain  Physical Exam  BP (!) 178/67   Pulse 75   Temp 97.9 F (36.6 C) (Oral)   Resp 16   SpO2 100%  Gen:   Awake, no distress   Resp:  Normal effort  MSK:   Moves extremities without difficulty  Other:  No focal neurologic deficits, no dysmetria, worsened dizziness with extraocular movements  Medical Decision Making  Medically screening exam initiated at 1:53 PM.  Appropriate orders placed.  [name] was informed that the remainder of the evaluation will be completed by another provider, this initial triage assessment does not replace that evaluation, and the importance of remaining in the ED until their evaluation is complete.  Concern for potential posterior circulation stroke versus benign peripheral vertigo.  Will order MRI and labs   Jacqlyn Larsen, [place] 12/10/21 1401

## 2021-12-13 ENCOUNTER — Ambulatory Visit (INDEPENDENT_AMBULATORY_CARE_PROVIDER_SITE_OTHER): Payer: Medicare Other | Admitting: Internal Medicine

## 2021-12-13 ENCOUNTER — Encounter: Payer: Self-pay | Admitting: Internal Medicine

## 2021-12-13 VITALS — BP 140/70 | HR 88 | Temp 98.1°F | Resp 16 | Ht 63.0 in | Wt 105.4 lb

## 2021-12-13 DIAGNOSIS — I10 Essential (primary) hypertension: Secondary | ICD-10-CM | POA: Diagnosis not present

## 2021-12-13 DIAGNOSIS — B9620 Unspecified Escherichia coli [E. coli] as the cause of diseases classified elsewhere: Secondary | ICD-10-CM | POA: Diagnosis not present

## 2021-12-13 DIAGNOSIS — N390 Urinary tract infection, site not specified: Secondary | ICD-10-CM

## 2021-12-13 LAB — URINE CULTURE: Culture: >=100000 — AB

## 2021-12-13 MED ORDER — CIPROFLOXACIN HCL 250 MG PO TABS: ORAL_TABLET | ORAL | 0 refills | Status: DC

## 2021-12-13 MED ORDER — BISOPROLOL-HYDROCHLOROTHIAZIDE 2.5-6.25 MG PO TABS
ORAL_TABLET | ORAL | 3 refills | Status: DC
Start: 2021-12-13 — End: 2023-01-07

## 2021-12-13 NOTE — Progress Notes (Signed)
Future Appointments  Date Time Provider Department  12/23/2021 11:00 AM Darrol Jump, NP [facility]  01/04/2022  2:30 PM Carleene Mains, RPH [facility]  12/14/2022  2:00 PM Darrol Jump, NP [facility]    History of Present Illness:     Patient is a very nice 88 yo MWF  with HTN, HLD, Insulin req T2_IDDM /CKD3a & Vit D Def  presenting as an ER f/u seen for "spinning " type Vertigo and despite absence of UT sx's , her U/C from ER 3 days ago returned today (+) E.coli.  Head CT scan showed old microvascular changes w/o new or acute infarction.  [name] BP was elevated at ER, But Vertigo sx's seem resolved since she restarted her BP meds.  Medications    Dulaglutide  1.5 MG/0.5ML SOPN, INJECT 0.5ML (1.'5MG'$ ) SUBCUTANEOUSLY ONCE WEEKLY   Insulin Glargine (BASAGLAR PEN) 100 UNIT/ML- INJECT 30 UNITS DAILY AS DIRECTED   bisoprolol-hctz 2.5-6.25 MG tablet, Take 1 tablet  daily.   rosuvastatin 20 MG tablet, Take  1 tablet  Daily    ipratropium 0.03 % nasal spray, Place 2 sprays into the nose 3  times daily.   aspirin 81 MG tablet, Take daily.   traMADol 50 MG tablet, Take every 6  hours as needed for moderate pain. Pt only takes 2 pills per night   vitamin B-12  1000 MCG tablet, Take  daily.   buPROPion  XL 150 MG 24 hr tablet, Take 1 tablet every Morning for Mood , Focus & Concentration   busPIRone (BUSPAR) 5 MG tablet, Take 1 tab three times a day as needed for mood/anxiety.   Cholecalciferol (VITAMIN D3) 2000 UNITS TABS, Take 1 capsule by mouth 2 (two) times daily.   COLACE 100 MG capsule, Take 1 capsule twice daily as needed for constipation with a bottle of water.   COSOPT 22.3-6.8 MG/ML ophth soln, Place 1 drop into both eyes 2 times daily.   DULoxetine 20 MG capsule, Take 1 cap daily for mood and chronic pain.   gabapentin  100 MG capsule, Take 1-2 caps 1 hour prior to bedtime as needed for pain and sleep.   XALATAN) 0.005 % ophth  soln, Place 1 drop into both eyes at bedtime.    LINZESS 72 MCG capsule, Start taking 1 cap daily for constipation.   meclizine  25 MG tablet, Take 0.5-1 tablets  3  times daily as needed for dizziness.   methocarbamol  500 MG tablet, Take  4  times daily.   Multiple Vitamins-Minerals' \\TABS'$ , Take 1 tablet by mouth daily.   Multiple Vitamins-Minerals (ICAPS AREDS 2 PO), Take 1 capsule by mouth daily.   rOPINIRole (REQUIP) 3 MG tablet, Take 1 to 2 tablets  at Bedtime   Problem list  She has Hypertension; Chronic pain; CKD stage 3 due to type 2 diabetes mellitus ([place]); Hyperlipidemia associated with type 2 diabetes mellitus ([place]); Vitamin D deficiency; Neuropathy due to type 2 diabetes mellitus ([place]); Medication management; Encounter for Medicare annual wellness exam; Atherosclerosis of aorta ([place]) by [employer] on 06/28/2017; Type 2 diabetes mellitus with complication, with long-term current use of insulin ([place]); Major depression, recurrent ([place]); Long term (current) use of insulin ([place]); Exudative age-related macular degeneration of right eye with active choroidal neovascularization ([place]); Exudative age-related macular degeneration of left eye with active choroidal neovascularization ([place]); Advanced nonexudative age-related macular degeneration of right eye with subfoveal involvement; Exudative age-related macular degeneration of left eye with inactive choroidal neovascularization ([place]); Memory changes; Poorly  controlled type 2 diabetes mellitus ([place]); Poor compliance with medication; Advanced nonexudative age-related macular degeneration of left eye with subfoveal involvement; and Frequent UTI on their problem list.   Observations/Objective:  BP ( 140/70   P  88   Te 98.1 F   R 16   Ht '5\' 3"'$    Wt 105 lb 6.4 oz  SpO2 98%   BMI 18.67   HEENT - WNL. Neck - supple.  Chest - Clear equal BS. Cor - Nl HS. RRR w/o sig MGR. PP 1(+). No edema. MS- FROM w/o deformities.  Gait Nl. Neuro -  Nl w/o focal abnormalities.   Assessment and  Plan:   1. E-coli UTI  - ciprofloxacin  250 MG tablet;  Take  1 tablet   2 x /day    Dispense: 20 tablet   2. Essential hypertension  - bisoprolol-hctz 2.5-6.25 MG tablet;  Take  1 tablet  Daily  for BP   Dispense: 90 tablet; Refill: 3   Follow Up Instructions:        I discussed the assessment and treatment plan with the patient. The patient was provided an opportunity to ask questions and all were answered. The patient agreed with the plan and demonstrated an understanding of the instructions.       The patient was advised to call back or seek an in-person evaluation if the symptoms worsen or if the condition fails to improve as anticipated.    Kirtland Bouchard, MD

## 2021-12-13 NOTE — Patient Instructions (Signed)
Urinary Tract Infection, Adult  A urinary tract infection (UTI) is an infection of any part of the urinary tract. The urinary tract includes the kidneys, ureters, bladder, and urethra. These organs make, store, and get rid of urine in the body. An upper UTI affects the ureters and kidneys. A lower UTI affects the bladder and urethra. What are the causes? Most urinary tract infections are caused by bacteria in your genital area around your urethra, where urine leaves your body. These bacteria grow and cause inflammation of your urinary tract. What increases the risk? You are more likely to develop this condition if: You have a urinary catheter that stays in place. You are not able to control when you urinate or have a bowel movement (incontinence).  You have certain genes that increase your risk.  You take antibiotic medicines. You have a condition that causes your flow of urine to slow down, such as:  Blockage in your urethra. A kidney stone. A nerve condition that affects your bladder control (neurogenic bladder). Not getting enough to drink, or not urinating often. You have certain medical conditions, such as: Diabetes. A weak disease-fighting system (immunesystem). Sickle cell disease. Gout. Spinal cord injury. What are the signs or symptoms? Symptoms of this condition include: Needing to urinate right away (urgency). Frequent urination. This may include small amounts of urine each time you urinate. Pain or burning with urination. Blood in the urine. Urine that smells bad or unusual. Trouble urinating. Cloudy urine. Vaginal discharge, if you are female. Pain in the abdomen or the lower back. You may also have: Vomiting or a decreased appetite. Confusion. Irritability or tiredness. A fever or chills. Diarrhea. The first symptom in older adults may be confusion. In some cases, they may not have any symptoms until the infection has worsened. How is this diagnosed? This  condition is diagnosed based on your medical history and a physical exam. You may also have other tests, including: Urine tests. Blood tests. If you have had more than one UTI, a cystoscopy or imaging studies may be done to determine the cause of the infections. How is this treated? Treatment for this condition includes: Antibiotic medicine. Over-the-counter medicines to treat discomfort. Drinking enough water to stay hydrated. If you have frequent infections or have other conditions such as a kidney stone, you may need to see a health care provider who specializes in the urinary tract (urologist). In rare cases, urinary tract infections can cause sepsis. Sepsis is a life-threatening condition that occurs when the body responds to an infection. Sepsis is treated in the hospital with IV antibiotics, fluids, and other medicines. Follow these instructions at home Medicines Take over-the-counter and prescription medicines only as told by your health care provider. If you were prescribed an antibiotic medicine, take it as told by your health care provider. Do not stop using the antibiotic even if you start to feel better. General instructions Make sure you: Empty your bladder often and completely. Do not hold urine for long periods of time. Empty your bladder after sex. Wipe from front to back after urinating or having a bowel movement if you are female. Use each tissue only one time when you wipe. Drink enough fluid to keep your urine pale yellow. Keep all follow-up visits. This is important. Contact a health care provider if: Your symptoms do not get better after 1-2 days. Your symptoms go away and then return. Get help right away if: You have severe pain in your back or your lower  abdomen. You have a fever or chills. You have nausea or vomiting. Summary A urinary tract infection (UTI) is an infection of any part of the urinary tract, which includes the kidneys, ureters, bladder, and  urethra. Most urinary tract infections are caused by bacteria in your genital area. Treatment for this condition often includes antibiotic medicines. If you were prescribed an antibiotic medicine, take it as told by your health care provider. Do not stop using the antibiotic even if you start to feel better. Keep all follow-up visits. This is important. This information is not intended to replace advice given to you by your health care provider. Make sure you discuss any questions you have with your health care provider. Document Revised: 11/22/2019 Document Reviewed: 11/22/2019 Elsevier Patient Education  [place].

## 2021-12-14 ENCOUNTER — Telehealth: Payer: Self-pay | Admitting: Emergency Medicine

## 2021-12-14 NOTE — Telephone Encounter (Signed)
Post ED Visit - Positive Culture Follow-up  Culture report reviewed by antimicrobial stewardship pharmacist: [facility] Team '[]'$  Elenor Quinones, Pharm.D. '[]'$  Heide Guile, Pharm.D., BCPS AQ-ID '[]'$  Parks Neptune, Pharm.D., BCPS '[]'$  Alycia Rossetti, Pharm.D., BCPS '[]'$  [place], Pharm.D., BCPS, AAHIVP '[]'$  Legrand Como, Pharm.D., BCPS, AAHIVP '[]'$  Salome Arnt, PharmD, BCPS '[]'$  Johnnette Gourd, PharmD, BCPS '[]'$  Hughes Better, PharmD, BCPS '[]'$  Leeroy Cha, PharmD '[]'$  Laqueta Linden, PharmD, BCPS '[]'$  Albertina Parr, PharmD  [place] Team '[]'$  Leodis Sias, PharmD '[]'$  Lindell Spar, PharmD '[]'$  Royetta Asal, PharmD '[]'$  Graylin Shiver, Rph '[]'$  Rema Fendt) Glennon Mac, PharmD '[]'$  Arlyn Dunning, PharmD '[]'$  Netta Cedars, PharmD '[]'$  Dia Sitter, PharmD '[]'$  Leone Haven, PharmD '[]'$  Gretta Arab, PharmD '[]'$  Theodis Shove, PharmD '[]'$  Peggyann Juba, PharmD '[]'$  Reuel Boom, PharmD Jimmy Footman PharmD   Positive urine culture Treated with ciprofloxacin by PCP, organism sensitive to the same and no further patient follow-up is required at this time.  Hazle Nordmann 12/14/2021, 9:03 AM

## 2021-12-21 ENCOUNTER — Emergency Department (HOSPITAL_COMMUNITY)
Admission: EM | Admit: 2021-12-21 | Discharge: 2021-12-21 | Disposition: A | Discharge status: Home / Self Care | Payer: Medicare Other | Attending: Emergency Medicine | Admitting: Emergency Medicine

## 2021-12-21 ENCOUNTER — Encounter (HOSPITAL_COMMUNITY): Payer: Self-pay

## 2021-12-21 ENCOUNTER — Emergency Department (HOSPITAL_COMMUNITY): Payer: Medicare Other

## 2021-12-21 ENCOUNTER — Other Ambulatory Visit: Payer: Self-pay

## 2021-12-21 DIAGNOSIS — R6889 Other general symptoms and signs: Secondary | ICD-10-CM | POA: Diagnosis not present

## 2021-12-21 DIAGNOSIS — E1140 Type 2 diabetes mellitus with diabetic neuropathy, unspecified: Secondary | ICD-10-CM | POA: Insufficient documentation

## 2021-12-21 DIAGNOSIS — R4182 Altered mental status, unspecified: Secondary | ICD-10-CM | POA: Diagnosis present

## 2021-12-21 DIAGNOSIS — R0902 Hypoxemia: Secondary | ICD-10-CM | POA: Diagnosis not present

## 2021-12-21 DIAGNOSIS — Z743 Need for continuous supervision: Secondary | ICD-10-CM | POA: Diagnosis not present

## 2021-12-21 DIAGNOSIS — I10 Essential (primary) hypertension: Secondary | ICD-10-CM | POA: Insufficient documentation

## 2021-12-21 DIAGNOSIS — Z20822 Contact with and (suspected) exposure to covid-19: Secondary | ICD-10-CM | POA: Insufficient documentation

## 2021-12-21 DIAGNOSIS — R443 Hallucinations, unspecified: Secondary | ICD-10-CM | POA: Diagnosis not present

## 2021-12-21 DIAGNOSIS — Z7982 Long term (current) use of aspirin: Secondary | ICD-10-CM | POA: Diagnosis not present

## 2021-12-21 DIAGNOSIS — Z794 Long term (current) use of insulin: Secondary | ICD-10-CM | POA: Diagnosis not present

## 2021-12-21 DIAGNOSIS — R410 Disorientation, unspecified: Secondary | ICD-10-CM | POA: Insufficient documentation

## 2021-12-21 DIAGNOSIS — R519 Headache, unspecified: Secondary | ICD-10-CM | POA: Diagnosis not present

## 2021-12-21 LAB — SARS CORONAVIRUS 2 BY RT PCR: SARS Coronavirus 2 by RT PCR: NEGATIVE

## 2021-12-21 LAB — LACTIC ACID, PLASMA
Lactic Acid, Venous: 2.1 mmol/L (ref 0.5–1.9)
Lactic Acid, Venous: 0.9 mmol/L (ref 0.5–1.9)

## 2021-12-21 LAB — COMPREHENSIVE METABOLIC PANEL
ALT: 31 U/L (ref 0–44)
AST: 40 U/L (ref 15–41)
Albumin: 3.5 g/dL (ref 3.5–5.0)
Alkaline Phosphatase: 72 U/L (ref 38–126)
Anion gap: 10 (ref 5–15)
BUN: 20 mg/dL (ref 8–23)
CO2: 28 mmol/L (ref 22–32)
Calcium: 9.5 mg/dL (ref 8.9–10.3)
Chloride: 93 mmol/L — ABNORMAL LOW (ref 98–111)
Creatinine, Ser: 1.29 mg/dL — ABNORMAL HIGH (ref 0.44–1.00)
GFR, Estimated: 40 mL/min — ABNORMAL LOW (ref >60)
Glucose, Bld: 141 mg/dL — ABNORMAL HIGH (ref 70–99)
Potassium: 3.3 mmol/L — ABNORMAL LOW (ref 3.5–5.1)
Sodium: 131 mmol/L — ABNORMAL LOW (ref 135–145)
Total Bilirubin: 0.6 mg/dL (ref 0.3–1.2)
Total Protein: 7 g/dL (ref 6.5–8.1)

## 2021-12-21 LAB — URINALYSIS, ROUTINE W REFLEX MICROSCOPIC
Bacteria, UA: NONE SEEN
Bilirubin Urine: NEGATIVE
Glucose, UA: NEGATIVE mg/dL
Hgb urine dipstick: NEGATIVE
Ketones, ur: NEGATIVE mg/dL
Nitrite: NEGATIVE
Protein, ur: NEGATIVE mg/dL
Specific Gravity, Urine: 1.011 (ref 1.005–1.030)
pH: 6 (ref 5.0–8.0)

## 2021-12-21 LAB — CBC WITH DIFFERENTIAL/PLATELET
Abs Immature Granulocytes: 0.07 10*3/uL (ref 0.00–0.07)
Basophils Absolute: 0.1 10*3/uL (ref 0.0–0.1)
Basophils Relative: 0 %
Eosinophils Absolute: 0.3 10*3/uL (ref 0.0–0.5)
Eosinophils Relative: 2 %
HCT: 34.1 % — ABNORMAL LOW (ref 36.0–46.0)
Hemoglobin: 11.9 g/dL — ABNORMAL LOW (ref 12.0–15.0)
Immature Granulocytes: 1 %
Lymphocytes Relative: 21 %
Lymphs Abs: 3.1 10*3/uL (ref 0.7–4.0)
MCH: 31.9 pg (ref 26.0–34.0)
MCHC: 34.9 g/dL (ref 30.0–36.0)
MCV: 91.4 fL (ref 80.0–100.0)
Monocytes Absolute: 1.5 10*3/uL — ABNORMAL HIGH (ref 0.1–1.0)
Monocytes Relative: 10 %
Neutro Abs: 9.5 10*3/uL — ABNORMAL HIGH (ref 1.7–7.7)
Neutrophils Relative %: 66 %
Platelets: 309 10*3/uL (ref 150–400)
RBC: 3.73 MIL/uL — ABNORMAL LOW (ref 3.87–5.11)
RDW: 12.1 % (ref 11.5–15.5)
WBC: 14.5 10*3/uL — ABNORMAL HIGH (ref 4.0–10.5)
nRBC: 0 % (ref 0.0–0.2)

## 2021-12-21 LAB — CBG MONITORING, ED
Glucose-Capillary: 64 mg/dL — ABNORMAL LOW (ref 70–99)
Glucose-Capillary: 109 mg/dL — ABNORMAL HIGH (ref 70–99)

## 2021-12-21 NOTE — ED Provider Notes (Signed)
[facility] EMERGENCY DEPARTMENT Provider Note   CSN: [id number] Arrival date & time: 12/21/21  3086     History  Chief Complaint  Patient presents with   Altered Mental Status    [name] is a 88 y.o. female.  HPI     This is an 88 year old female who presents with concerns for hallucination and altered mental status.  Husband and son are at the bedside.  Husband states that throughout the evening she hallucinated which has never happened before.  She did not seem to recognize her husband.  The son describes her picking at things that were not there.  She has been treated for a UTI but they are unsure of what antibiotic she is on.  She has had some recent nausea and vomiting with increased dizziness and falls.  She fell earlier today.  She did hit her head but there was no loss of consciousness.  She is not on any blood thinners.  Son reports overall she has not been eating and drinking well lately.  Chart reviewed.  Appears she has been on ciprofloxacin since 8/21 for her UTI.  Urine culture from 8/18 was sensitive for Cipro.  Home Medications Prior to Admission medications   Medication Sig Start Date End Date Taking? Authorizing Provider  AquaLance Lancets 30G MISC CHECK BLOOD SUGAR 3 TIMES A DAY 04/06/21   Alycia Rossetti, NP  aspirin 81 MG tablet Take 81 mg by mouth daily.    [provider]  bisoprolol-hydrochlorothiazide [facility]) 2.5-6.25 MG tablet Take  1 tablet  Daily  for BP 12/13/21   [name], MD  Blood Glucose Calibration (ACCU-CHEK AVIVA) SOLN Check blood sugar 3 times daily-DX-E11.22 03/11/15   [name], MD  Blood Glucose Monitoring Suppl ([place]) w/Device KIT Check blood sugar 3 times daily-E11.8 08/18/20   [name], MD  buPROPion (WELLBUTRIN XL) 150 MG 24 hr tablet Take 1 tablet every Morning for Mood , Focus & Concentration 03/28/19   [name], MD  busPIRone (BUSPAR) 5 MG tablet Take  1 tab three times a day as needed for mood/anxiety. 08/31/21   Liane Comber, NP  Cholecalciferol (VITAMIN D3) 2000 UNITS TABS Take 1 capsule by mouth 2 (two) times daily.    [provider]  ciprofloxacin (CIPRO) 250 MG tablet Take  1 tablet   2 x /day  with Food for Urinary Tract  Infection 12/13/21   [name], MD  COMFORT EZ PEN NEEDLES 32G X 4 MM MISC USE 2 TIMES DAILY AS DIRECTED , 1 PEN NEEDLE PER INJECTION. 01/14/19   [name], MD  Continuous Blood Gluc Sensor (FREESTYLE LIBRE SENSOR SYSTEM) MISC Check blood sugar 3 times a day. Dx:E11.8, Z79.4 12/26/19   Liane Comber, NP  Continuous Blood Gluc Sensor MISC Check blood sugar 3 times a day- Dx: E11.8/Z79.4 05/09/18   Liane Comber, NP  docusate sodium (COLACE) 100 MG capsule Take 1 capsule twice daily as needed for constipation with a bottle of water. 08/26/21   Liane Comber, NP  dorzolamide-timolol (COSOPT) 22.3-6.8 MG/ML ophthalmic solution Place 1 drop into both eyes 2 (two) times daily. 03/31/16   [provider]  Dulaglutide (TRULICITY) 1.5 VH/8.4ON SOPN INJECT 0.5ML (1.5MG) SUBCUTANEOUSLY ONCE WEEKLY 03/29/21   Liane Comber, NP  DULoxetine (CYMBALTA) 20 MG capsule Take 1 cap daily for mood and chronic pain. 10/19/21   Liane Comber, NP  EASY COMFORT INSULIN SYRINGE 30G X 1/2" 0.5 ML MISC  10/01/15  [provider]  gabapentin (NEURONTIN) 100 MG capsule Take 1-2 caps 1 hour prior to bedtime as needed for pain and sleep. 10/19/21   Liane Comber, NP  glucose blood (ONETOUCH VERIO) test strip USE 1 STRIP TO CHECK GLUCOSE THREE TIMES DAILY AS DIRECTED 05/04/21   Liane Comber, NP  Insulin Glargine (BASAGLAR KWIKPEN) 100 UNIT/ML DIAL AND INJECT 30 UNITS UNDER THE SKIN ONCE DAILY AS DIRECTED 05/18/21   Liane Comber, NP  ipratropium (ATROVENT) 0.03 % nasal spray Place 2 sprays into the nose 3 (three) times daily. 06/22/21 06/22/22  Alycia Rossetti, NP  Lancet Devices (ADJUSTABLE LANCING DEVICE) MISC  CHECK BLOOD SUGAR 3 TIMES A DAY 09/07/20   [name], MD  latanoprost (XALATAN) 0.005 % ophthalmic solution Place 1 drop into both eyes at bedtime. 03/28/16   [provider]  linaclotide Rolan Lipa) 72 MCG capsule Start taking 1 cap daily for constipation. 10/19/21   Liane Comber, NP  meclizine (ANTIVERT) 25 MG tablet Take 0.5-1 tablets (12.5-25 mg total) by mouth 3 (three) times daily as needed for dizziness. 12/10/21   Hayden Rasmussen, MD  methocarbamol (ROBAXIN) 500 MG tablet Take 500 mg by mouth 4 (four) times daily.    [provider]  Multiple Vitamins-Minerals (CENTRUM SILVER 50+WOMEN) TABS Take 1 tablet by mouth daily.    [provider]  Multiple Vitamins-Minerals (ICAPS AREDS 2 PO) Take 1 capsule by mouth daily.    [provider]  rOPINIRole (REQUIP) 3 MG tablet Take 1 to 2 tablets  at Bedtime  for Restless Legs                                                               /                     TAKE  BY MOUTH 04/21/21   [name], MD  rosuvastatin (CRESTOR) 20 MG tablet Take  1 tablet  Daily for Cholesterol                                    /                              TAKE                         BYMOUTH                   ONCEDAILY 07/27/21   [name], MD  traMADol (ULTRAM) 50 MG tablet Take 50 mg by mouth every 6 (six) hours as needed for moderate pain. Pt only takes 2 pills per night    [provider]  vitamin B-12 (CYANOCOBALAMIN) 1000 MCG tablet Take 1,000 mcg by mouth daily.    [provider]      Allergies    Morphine and related    Review of Systems   Review of Systems  Constitutional:  Negative for fever.  Respiratory:  Negative for shortness of breath.   Cardiovascular:  Negative for chest pain.  Gastrointestinal:  Positive for nausea.  Neurological:  Positive for  dizziness.  All other systems reviewed and are negative.   Physical Exam Updated Vital Signs BP (!) 158/79   Pulse 64   Temp  (!) 97 F (36.1 C) (Rectal)   Resp 14   SpO2 100%  Physical Exam Vitals and nursing note reviewed.  Constitutional:      Appearance: She is well-developed.     Comments: Oriented x2, elderly  HENT:     Head: Normocephalic and atraumatic.     Nose:     Comments: Slight disfigurement the right side of her nose Eyes:     Pupils: Pupils are equal, round, and reactive to light.  Cardiovascular:     Rate and Rhythm: Normal rate and regular rhythm.     Heart sounds: Normal heart sounds.  Pulmonary:     Effort: Pulmonary effort is normal. No respiratory distress.     Breath sounds: No wheezing.  Abdominal:     Palpations: Abdomen is soft.  Musculoskeletal:        General: No deformity.     Cervical back: Neck supple.  Skin:    General: Skin is warm and dry.  Neurological:     Mental Status: She is alert.     Comments: Oriented to self and place, cranial nerves II through XII intact, fluent speech  Psychiatric:        Mood and Affect: Mood normal.     ED Results / Procedures / Treatments   Labs (all labs ordered are listed, but only abnormal results are displayed) Labs Reviewed  CBC WITH DIFFERENTIAL/PLATELET - Abnormal; Notable for the following components:      Result Value   WBC 14.5 (*)    RBC 3.73 (*)    Hemoglobin 11.9 (*)    HCT 34.1 (*)    Neutro Abs 9.5 (*)    Monocytes Absolute 1.5 (*)    All other components within normal limits  LACTIC ACID, PLASMA - Abnormal; Notable for the following components:   Lactic Acid, Venous 2.1 (*)    All other components within normal limits  URINALYSIS, ROUTINE W REFLEX MICROSCOPIC - Abnormal; Notable for the following components:   Leukocytes,Ua TRACE (*)    All other components within normal limits  CBG MONITORING, ED - Abnormal; Notable for the following components:   Glucose-Capillary 64 (*)    All other components within normal limits  CBG MONITORING, ED - Abnormal; Notable for the following components:    Glucose-Capillary 109 (*)    All other components within normal limits  CULTURE, BLOOD (ROUTINE X 2)  CULTURE, BLOOD (ROUTINE X 2)  URINE CULTURE  LACTIC ACID, PLASMA  COMPREHENSIVE METABOLIC PANEL    EKG EKG Interpretation  Date/Time:  Tuesday December 21 2021 03:48:29 EDT Ventricular Rate:  62 PR Interval:  205 QRS Duration: 95 QT Interval:  451 QTC Calculation: 458 R Axis:   75 Text Interpretation: Sinus rhythm Confirmed by Thayer Jew [phone]) on 12/21/2021 7:50:26 AM  Radiology CT Head Wo Contrast  Result Date: 12/21/2021 CLINICAL DATA:  Head trauma, new onset altered mental status. EXAM: CT HEAD WITHOUT CONTRAST TECHNIQUE: Contiguous axial images were obtained from the base of the skull through the vertex without intravenous contrast. RADIATION DOSE REDUCTION: This exam was performed according to the departmental dose-optimization program which includes automated exposure control, adjustment of the mA and/or kV according to patient size and/or use of iterative reconstruction technique. COMPARISON:  11/09/2016, 12/10/2021. FINDINGS: Brain: No acute intracranial hemorrhage, midline shift or mass effect.  No extra-axial fluid collection. Diffuse atrophy is noted. Periventricular and subcortical white matter hypodensities are noted bilaterally. No hydrocephalus. Vascular: No hyperdense vessel or unexpected calcification. Skull: Normal. Negative for fracture or focal lesion. Sinuses/Orbits: No acute finding. Other: None. IMPRESSION: 1. No acute intracranial process. 2. Atrophy with chronic microvascular ischemic changes. Electronically Signed   By: Brett Fairy M.D.   On: 12/21/2021 04:36    Procedures Procedures    Medications Ordered in ED Medications - No data to display  ED Course/ Medical Decision Making/ A&P                           Medical Decision Making Amount and/or Complexity of Data Reviewed Labs: ordered. Radiology: ordered.   This patient presents to the ED  for concern of hallucinations, this involves an extensive number of treatment options, and is a complaint that carries with it a high risk of complications and morbidity.  I considered the following differential and admission for this acute, potentially life threatening condition.  The differential diagnosis includes encephalopathy, metabolic encephalopathy, infection, trauma  MDM:    This is a an 88 year old female who presents with hallucinations.  Recently diagnosed and treated for UTI.  She is oriented x2 but is otherwise nonfocal.  She is not ill-appearing.  Labs obtained.  Urinalysis is not consistent with UTI.  White count is elevated at 14.  She is afebrile.  Normal lactate .  Initial CBG 64.  She was given food and this improved without other intervention.  CT head without evidence of acute trauma.  We will add COVID and chest x-ray. (Labs, imaging, consults)  Labs: I Ordered, and personally interpreted labs.  The pertinent results include: See above  Imaging Studies ordered: I ordered imaging studies including CT negative, x-ray pending I independently visualized and interpreted imaging. I agree with the radiologist interpretation  Additional history obtained from bedside.  External records from outside source obtained and reviewed including prior ED visit and urine cultures  Cardiac Monitoring: The patient was maintained on a cardiac monitor.  I personally viewed and interpreted the cardiac monitored which showed an underlying rhythm of: Normal sinus rhythm  Reevaluation: After the interventions noted above, I reevaluated the patient and found that they have :stayed the same  Social Determinants of Health: Lives with family  Disposition: PEnding  Co morbidities that complicate the patient evaluation  Past Medical History:  Diagnosis Date   Arthritis    Chronic back pain    Cystoid macular edema of right eye 08/19/2019   Diabetic neuropathy ([place])    Exudative age-related  macular degeneration of left eye with inactive choroidal neovascularization ([place]) 11/18/2019   Hypercholesteremia    Hypertension    Liver lesion    Neuropathy    Type II or unspecified type diabetes mellitus without mention of complication, not stated as uncontrolled    Vitamin D deficiency    Wears glasses      Medicines No orders of the defined types were placed in this encounter.   I have reviewed the patients home medicines and have made adjustments as needed  Problem List / ED Course: Problem List Items Addressed This Visit   None Visit Diagnoses     Delirium    -  Primary                   Final Clinical Impression(s) / ED Diagnoses Final diagnoses:  None  Rx / DC Orders ED Discharge Orders     None         Kyle Stansell, Barbette Hair, MD 12/21/21 [phone]

## 2021-12-21 NOTE — ED Triage Notes (Signed)
BIB [facility] after pt family member called to report new onset AMS. Pt being treated for UTI since 8/18, but symptoms not getting better. Pt also C/o n/v, dizziness, increase falls ( No thinners).   HX: HTN, DM

## 2021-12-21 NOTE — ED Provider Notes (Signed)
Patient signed to me by Dr. Dina Rich pending results of COVID and flu test.  Both of which came back without significant abnormality.  Informed family of results and family is currently taking patient home.   Lacretia Leigh, MD 12/21/21 [phone]

## 2021-12-21 NOTE — ED Notes (Signed)
Pt given 8 oz of apple juice for CBG of 64. RN to recheck.

## 2021-12-22 ENCOUNTER — Telehealth: Payer: Self-pay

## 2021-12-22 LAB — URINE CULTURE: Culture: NO GROWTH

## 2021-12-22 NOTE — Telephone Encounter (Signed)
LM-12/22/21-Called pt. To complete ED discharge protocol for AMS and hallucinations. Unable to reach pt. [place] X1.  Total time spent: 2 min.     LM-12/22/21- I called pts. Spouse and call was answered by Wife Mee Hives and she said Asked if Mr. Zuba was available and she stated "I think my husband has an appointment this morning, didn't he?" Informed wife that pt. Was scheduled to CP but I was calling to assist pt. In rescheduling his visit. Mrs. Dial did not speak anymore. From what I heard on the line It sounded like the phone was being picked up and moved around. After introducing myself again after several attempts, the call was disconnected on their end.he sounded confused and was not speaking on the other line, but sounded like she was moving the phone around Called pts. Listed contact Legrand Como at [phone] out of concern of pt.but was unable to reach. [place].  Total time spent: 4 min.

## 2021-12-23 ENCOUNTER — Ambulatory Visit (INDEPENDENT_AMBULATORY_CARE_PROVIDER_SITE_OTHER): Payer: Medicare Other | Admitting: Nurse Practitioner

## 2021-12-23 ENCOUNTER — Encounter: Payer: Self-pay | Admitting: Nurse Practitioner

## 2021-12-23 VITALS — BP 90/60 | HR 69 | Temp 97.9°F | Resp 17 | Ht 63.0 in | Wt 105.3 lb

## 2021-12-23 DIAGNOSIS — E1140 Type 2 diabetes mellitus with diabetic neuropathy, unspecified: Secondary | ICD-10-CM

## 2021-12-23 DIAGNOSIS — Z794 Long term (current) use of insulin: Secondary | ICD-10-CM

## 2021-12-23 DIAGNOSIS — G8929 Other chronic pain: Secondary | ICD-10-CM

## 2021-12-23 DIAGNOSIS — E1165 Type 2 diabetes mellitus with hyperglycemia: Secondary | ICD-10-CM | POA: Diagnosis not present

## 2021-12-23 DIAGNOSIS — E559 Vitamin D deficiency, unspecified: Secondary | ICD-10-CM

## 2021-12-23 DIAGNOSIS — E1122 Type 2 diabetes mellitus with diabetic chronic kidney disease: Secondary | ICD-10-CM

## 2021-12-23 DIAGNOSIS — B9620 Unspecified Escherichia coli [E. coli] as the cause of diseases classified elsewhere: Secondary | ICD-10-CM

## 2021-12-23 DIAGNOSIS — E1169 Type 2 diabetes mellitus with other specified complication: Secondary | ICD-10-CM

## 2021-12-23 DIAGNOSIS — H353221 Exudative age-related macular degeneration, left eye, with active choroidal neovascularization: Secondary | ICD-10-CM | POA: Diagnosis not present

## 2021-12-23 DIAGNOSIS — I700 Atherosclerosis of aorta: Secondary | ICD-10-CM

## 2021-12-23 DIAGNOSIS — N183 Chronic kidney disease, stage 3 unspecified: Secondary | ICD-10-CM

## 2021-12-23 DIAGNOSIS — I9589 Other hypotension: Secondary | ICD-10-CM

## 2021-12-23 DIAGNOSIS — E118 Type 2 diabetes mellitus with unspecified complications: Secondary | ICD-10-CM | POA: Diagnosis not present

## 2021-12-23 DIAGNOSIS — R410 Disorientation, unspecified: Secondary | ICD-10-CM

## 2021-12-23 DIAGNOSIS — F325 Major depressive disorder, single episode, in full remission: Secondary | ICD-10-CM

## 2021-12-23 DIAGNOSIS — N390 Urinary tract infection, site not specified: Secondary | ICD-10-CM

## 2021-12-23 DIAGNOSIS — R6889 Other general symptoms and signs: Secondary | ICD-10-CM

## 2021-12-23 DIAGNOSIS — I10 Essential (primary) hypertension: Secondary | ICD-10-CM

## 2021-12-23 DIAGNOSIS — E785 Hyperlipidemia, unspecified: Secondary | ICD-10-CM

## 2021-12-23 DIAGNOSIS — Z0000 Encounter for general adult medical examination without abnormal findings: Secondary | ICD-10-CM

## 2021-12-23 DIAGNOSIS — H353211 Exudative age-related macular degeneration, right eye, with active choroidal neovascularization: Secondary | ICD-10-CM

## 2021-12-23 DIAGNOSIS — Z0001 Encounter for general adult medical examination with abnormal findings: Secondary | ICD-10-CM | POA: Diagnosis not present

## 2021-12-23 DIAGNOSIS — Z79899 Other long term (current) drug therapy: Secondary | ICD-10-CM

## 2021-12-23 NOTE — Progress Notes (Signed)
ER WEEK FOLLOW UP, ANNUAL WELLNESS VISIT  Assessment:    Annual Medicare Wellness Visit Due annually  Health maintenance reviewed   Atherosclerosis of aorta ([place])- per CT 06/2017 Discussed lifestyle modifications. Recommended diet heavy in fruits and veggies, omega 3's. Decrease consumption of animal meats, cheeses, and dairy products. Remain active and exercise as tolerated. Continue to monitor. Check lipids/TSH   Essential hypertension Less than goal - hypotensive. Hold Ziac if BP <120/80 Stay well hydrated. Discussed DASH (Dietary Approaches to Stop Hypertension) DASH diet is lower in sodium than a typical American diet. Cut back on foods that are high in saturated fat, cholesterol, and trans fats. Eat more whole-grain foods, fish, poultry, and nuts Remain active and exercise as tolerated daily.  Monitor BP at home-Call if uncontrolled.  Uncontrolled secondary diabetes mellitus with stage 3 CKD (GFR 30-59) (HCC)/ Long term current use of insulin (HCC) STOP Trulicity. Do not administer insulin unless BG is <90 Reduce the amount of insulin to 5U considering dietary changes. Monitor diet and take in food, at least 3 meals daily. Focus on protein sources such as fish, chicken, fruits, veggies. Discussed how what you eat and drink can aide in kidney protection. Stay well hydrated. Avoid high salt foods. Avoid NSAIDS. Keep BP and BG well controlled.   Take medications as prescribed.  Type 2 diabetes with neuropathy [facility]) Discussed general issues about diabetes pathophysiology and management., Educational material distributed. Encouraged aerobic exercise., Discussed foot care. Continue low dose Gabapentin PRN.  Mixed hyperlipidemia associated with T2DM (HCC) Discussed lifestyle modifications. Recommended diet heavy in fruits and veggies, omega 3's. Decrease consumption of animal meats, cheeses, and dairy products. Remain active and exercise as tolerated. Continue to  monitor.  CKD 3 associated with T2DM (HCC) Continue to follow with Nephrology, Dr. Moshe Cipro  Avoid NSAIDs, encourage regular hydration Needs better control of glucose - see plan above   Macular degeneration of left and right eye ([place]) - BIL Getting injections by Dr. Zadie Rhine Continue to monitor  Chronic pain syndrome Ortho following, Monitor  Medication management All medications discussed and reviewed in full. All questions and concerns regarding medications addressed.     Vitamin D deficiency Continue to recommend supplementation for goal of 60-100 Check vitamin D level annually and after dose change  Major depression in remission ([place])  Continue low dose wellbutrin for energy and motivation  Lifestyle discussed: diet/exerise, sleep hygiene, stress management, hydration  Delirium Resolved. Secondary to UTI and malnutrition from decreased appetite on Trulicity. Discussed importance of staying well hydrated. Continue to monitor  E. Coli UTI Will obtain updated UA Pending  Other specified hypotension Secondary to dehydration and malnutrition from administration of Trulicity Stay well-hydrated. Focus on eating 3 meals a day. Do not take BP med if BP <120/80  Orders Placed This Encounter  Procedures   Urine Culture   MICROSCOPIC MESSAGE   COMPLETE METABOLIC PANEL WITH GFR   Urinalysis, Routine w reflex microscopic   Notify office for further evaluation and treatment, questions or concerns if s/s fail to improve. The risks and benefits of my recommendations, as well as other treatment options were discussed with the patient today. Questions were answered.   Further disposition pending results of labs. Discussed med's effects and SE's.     Over 40 minutes of exam, counseling, chart review, and critical decision making was performed.    Future Appointments  Date Time Provider [place]  01/04/2022  1:45 PM Darrol Jump, NP [facility] None  01/04/2022  2:30 PM Carleene Mains, RPH [facility] None  09/12/2022 10:00 AM Darrol Jump, NP [facility] None  12/14/2022  2:00 PM [place], Kenney Houseman, NP [facility] None      Plan:   During the course of the visit the patient was educated and counseled about appropriate screening and preventive services including:   Pneumococcal vaccine  Influenza vaccine Td vaccine Screening electrocardiogram Bone densitometry screening Colorectal cancer screening Diabetes screening Glaucoma screening Nutrition counseling  Advanced directives: requested   Subjective:   [name]  presents with husband and son for AWV and ED follow up after presentation of delirium.    Patient was seen in ED on 12/21/21 for AMS with hallucinations.  She had associated nausea and increase in dizziness, falling but not hitting her head.  She reports some soreness on the right side of her body but overall she is feeling back to baseline.  She is AO x3.    She has been administering Trulicty for a better control of DM2, however, this has caused her to not feel hungry or thirsty.  Family agrees that she is not eating or drinking.  Her BMI is now just above 18 and she is continuing to drop weight.  Reports BG one morning around 40.    She has reported unsteady gait, chronic pain, follows with [facility] (Dr. Norm Salt) and [facility], also with diabetic neuropathy. Just had injection into bil hips yesterday.  She is using a cane today in clinic.     She has depression with reported lack of motivation and energy, now on wellbutrin 150 mg daily and doing well.   BMI is Body mass index is 18.65 kg/m.,  Wt Readings from Last 3 Encounters:  12/23/21 105 lb 4.8 oz (47.8 kg)  12/13/21 105 lb 6.4 oz (47.8 kg)  12/07/21 109 lb 12.8 oz (49.8 kg)   Patient is treated for HTN since 2000, takes ziac daily, but asked to hold since recent BP's have been low.  BP today was BP: 90/60  She has aortic atherosclerosis per CT  2017 and 2019. Patient had a negative Cardiolite in 2009. Patient has had no complaints of any cardiac type chest pain, palpitations, dyspnea/orthopnea/PND, dizziness, claudication, or dependent edema.  She has hyperlipidemia; On rosuvastatin 20 mg daily. Patient denies myalgias or other med SE's. Last Lipids were not at goal with slightly elevated triglycerides. Lab Results  Component Value Date   CHOL 138 12/07/2021   HDL 48 (L) 12/07/2021   LDLCALC 67 12/07/2021   TRIG 152 (H) 12/07/2021   CHOLHDL 2.9 12/07/2021   Also, the patient has history of T2DM x 1996 and has been on insulin since 2000 and she has complications w/ CKD and Neuropathy.  Off of metformin due to CKD 3b; on bASA, statin Not on ACE/ARB per recommendation by nephrology Dr. Moshe Cipro Has intermittently seen endocrinology though not consistently, recently has been managed by our office though with suspected poor compliance with recommended diet/med regimen/appointments She is on novolin 70/30, today reports has been taking 18-20 units with each meal, 2-3 times a day. However, husband has been administering since BG have been around 16-109 d/t Trulicity 6.04 mg weekly.   She does report tingling and occasional painful paresthesias of the feet.  She takes requip for RLS, reports hemp oil helping with foot pain.  Lab Results  Component Value Date   HGBA1C 7.0 (H) 12/07/2021   She follows with [facility] Dr. Moshe Cipro, advised to stay off of ACE/ARB (  caused deterioration) and NSAIDs.  Lab Results  Component Value Date   GFRNONAA 40 (L) 12/21/2021   GFRNONAA 39 (L) 12/10/2021   GFRNONAA 41 (L) 11/23/2021   Further, the patient also has history of Vitamin D Deficiency, unsure how much she takes, "3-4"  Lab Results  Component Value Date   VD25OH 60 12/07/2021   She takes B12 SL 1000 mcg daily Lab Results  Component Value Date   MIWOEHOZ22 482 12/03/2020   She had unexplained leukocytosis last OV, path  smear without clear abnormality, she did have joint injection the day prior and recent UTI.    Latest Ref Rng & Units 12/21/2021    3:53 AM 12/10/2021    2:08 PM 12/07/2021    3:22 PM  CBC  WBC 4.0 - 10.5 K/uL 14.5  10.2  9.3   Hemoglobin 12.0 - 15.0 g/dL 11.9  12.3  11.6   Hematocrit 36.0 - 46.0 % 34.1  37.1  34.7   Platelets 150 - 400 K/uL 309  318  313        Current Outpatient Medications on File Prior to Visit  Medication Sig   AquaLance Lancets 30G MISC CHECK BLOOD SUGAR 3 TIMES A DAY   aspirin 81 MG tablet Take 81 mg by mouth daily.   bisoprolol-hydrochlorothiazide (ZIAC) 2.5-6.25 MG tablet Take  1 tablet  Daily  for BP   Blood Glucose Calibration (ACCU-CHEK AVIVA) SOLN Check blood sugar 3 times daily-DX-E11.22   Blood Glucose Monitoring Suppl (ONETOUCH VERIO FLEX SYSTEM) w/Device KIT Check blood sugar 3 times daily-E11.8   buPROPion (WELLBUTRIN XL) 150 MG 24 hr tablet Take 1 tablet every Morning for Mood , Focus & Concentration   busPIRone (BUSPAR) 5 MG tablet Take 1 tab three times a day as needed for mood/anxiety.   Cholecalciferol (VITAMIN D3) 2000 UNITS TABS Take 1 capsule by mouth 2 (two) times daily.   ciprofloxacin (CIPRO) 250 MG tablet Take  1 tablet   2 x /day  with Food for Urinary Tract  Infection   COMFORT EZ PEN NEEDLES 32G X 4 MM MISC USE 2 TIMES DAILY AS DIRECTED , 1 PEN NEEDLE PER INJECTION.   Continuous Blood Gluc Sensor (FREESTYLE LIBRE SENSOR SYSTEM) MISC Check blood sugar 3 times a day. Dx:E11.8, Z79.4   Continuous Blood Gluc Sensor MISC Check blood sugar 3 times a day- Dx: E11.8/Z79.4   docusate sodium (COLACE) 100 MG capsule Take 1 capsule twice daily as needed for constipation with a bottle of water.   dorzolamide-timolol (COSOPT) 22.3-6.8 MG/ML ophthalmic solution Place 1 drop into both eyes 2 (two) times daily.   Dulaglutide (TRULICITY) 1.5 NO/0.3BC SOPN INJECT 0.5ML (1.5MG) SUBCUTANEOUSLY ONCE WEEKLY   DULoxetine (CYMBALTA) 20 MG capsule Take 1 cap  daily for mood and chronic pain.   EASY COMFORT INSULIN SYRINGE 30G X 1/2" 0.5 ML MISC    gabapentin (NEURONTIN) 100 MG capsule Take 1-2 caps 1 hour prior to bedtime as needed for pain and sleep.   glucose blood (ONETOUCH VERIO) test strip USE 1 STRIP TO CHECK GLUCOSE THREE TIMES DAILY AS DIRECTED   Insulin Glargine (BASAGLAR KWIKPEN) 100 UNIT/ML DIAL AND INJECT 30 UNITS UNDER THE SKIN ONCE DAILY AS DIRECTED   ipratropium (ATROVENT) 0.03 % nasal spray Place 2 sprays into the nose 3 (three) times daily.   Lancet Devices (ADJUSTABLE LANCING DEVICE) MISC CHECK BLOOD SUGAR 3 TIMES A DAY   latanoprost (XALATAN) 0.005 % ophthalmic solution Place 1 drop into both eyes at  bedtime.   linaclotide (LINZESS) 72 MCG capsule Start taking 1 cap daily for constipation.   meclizine (ANTIVERT) 25 MG tablet Take 0.5-1 tablets (12.5-25 mg total) by mouth 3 (three) times daily as needed for dizziness.   methocarbamol (ROBAXIN) 500 MG tablet Take 500 mg by mouth 4 (four) times daily.   Multiple Vitamins-Minerals (CENTRUM SILVER 50+WOMEN) TABS Take 1 tablet by mouth daily.   Multiple Vitamins-Minerals (ICAPS AREDS 2 PO) Take 1 capsule by mouth daily.   rOPINIRole (REQUIP) 3 MG tablet Take 1 to 2 tablets  at Bedtime  for Restless Legs                                                               /                     TAKE  BY MOUTH   rosuvastatin (CRESTOR) 20 MG tablet Take  1 tablet  Daily for Cholesterol                                    /                              TAKE                         BYMOUTH                   ONCEDAILY   traMADol (ULTRAM) 50 MG tablet Take 50 mg by mouth every 6 (six) hours as needed for moderate pain. Pt only takes 2 pills per night   vitamin B-12 (CYANOCOBALAMIN) 1000 MCG tablet Take 1,000 mcg by mouth daily.   No current facility-administered medications on file prior to visit.   Patient Active Problem List   Diagnosis Date Noted   Frequent UTI 02/10/2020   Advanced nonexudative  age-related macular degeneration of left eye with subfoveal involvement 01/08/2020   Memory changes 12/31/2019   Poorly controlled type 2 diabetes mellitus ([place]) 12/31/2019   Poor compliance with medication 12/31/2019   Advanced nonexudative age-related macular degeneration of right eye with subfoveal involvement 11/18/2019   Exudative age-related macular degeneration of left eye with inactive choroidal neovascularization ([place]) 11/18/2019   Exudative age-related macular degeneration of right eye with active choroidal neovascularization ([place]) 08/19/2019   Exudative age-related macular degeneration of left eye with active choroidal neovascularization ([place]) 08/19/2019   Long term (current) use of insulin ([place]) 06/10/2019   Major depression, recurrent ([place]) 12/20/2018   Type 2 diabetes mellitus with complication, with long-term current use of insulin ([place]) 02/09/2017   Atherosclerosis of aorta ([place]) by AbdCTscan on 06/28/2017 11/06/2016   Encounter for Medicare annual wellness exam 05/18/2015   Medication management 08/06/2013   Neuropathy due to type 2 diabetes mellitus ([place]) 06/04/2013   CKD stage 3 due to type 2 diabetes mellitus ([place])    Hyperlipidemia associated with type 2 diabetes mellitus ([place])    Vitamin D deficiency    Hypertension 07/29/2012   Chronic pain 07/29/2012    Screening Tests  Immunization History  Administered Date(s) Administered  DT (Pediatric) 01/04/2011   Influenza Split 01/23/2013   Influenza, High Dose Seasonal PF 02/27/2014, 03/11/2015, 12/21/2015, 01/30/2017, 02/08/2018, 01/02/2019, 01/29/2021   Influenza-Unspecified 02/03/2020   PFIZER(Purple Top)SARS-COV-2 Vaccination 07/22/2019, 08/13/2019   Pneumococcal Conjugate-13 02/27/2014   Pneumococcal Polysaccharide-23 01/06/2012   Preventative care: Last colonoscopy: 2006 will not get another due to age [facility] 12/2019, reminded to schedule at breast center DEXA 2007 order placed to schedule  Tetanus 2012 Flu  - Due 2023 Pneumo 2013 Prevnar 13 2015 Shingles vaccine declines Covid 19: 2/2, 2021, pfizer + 2 boosters  Names of Other Physician/Practitioners you currently use: 1. [facility] here for primary care 2. [facility], eye doctor, last visit in 09/24/2020, no retinopathy, report abstracted,  3. Dr Sissy Hoff, DDS, dentist, last visit 2022, goes q23m Patient Care Team: MUnk Pinto MD as PCP - General (Internal Medicine) GWarden Fillers MD as Consulting Physician (Optometry) RZadie Rhine GClent Demark MD as Consulting Physician (Ophthalmology) RGlenna Fellows MD as Attending Physician (Neurosurgery) DMelrose Nakayama MD as Consulting Physician (Orthopedic Surgery) JMilus Banister MD as Attending Physician (Gastroenterology) ANewton Pigg RMem[facility] Surgery Centeras Pharmacist (Pharmacist)   Allergies Allergies  Allergen Reactions   Morphine And Related Other (See Comments)    fuzzy feeling    SURGICAL HISTORY She  has a past surgical history that includes Abdominal hysterectomy; Cesarean section; Back surgery (1993); Appendectomy; Colonoscopy; Eye surgery; Trigger finger release (01/10/2012); and Breast excisional biopsy (Right, 1967). FAMILY HISTORY Her family history includes Diabetes in her brother and sister; Kidney cancer in her brother; Liver cancer in her sister; Liver disease in her sister; Lymphoma in her sister; Stroke in her brother. SOCIAL HISTORY She  reports that she has never smoked. She has never used smokeless tobacco. She reports that she does not drink alcohol and does not use drugs.  MEDICARE WELLNESS OBJECTIVES: Physical activity: Exercise limited by: neurologic condition(s);orthopedic condition(s) Cardiac risk factors: Cardiac Risk Factors include: advanced age (>552m, >6>32omen);diabetes mellitus;dyslipidemia Depression/mood screen:      12/27/2021    8:11 PM  Depression screen PHQ 2/9  Decreased Interest 0  Down, Depressed,  Hopeless 1  PHQ - 2 Score 1    ADLs:     12/27/2021    8:10 PM  In your present state of health, do you have any difficulty performing the following activities:  Hearing? 0  Vision? 0  Difficulty concentrating or making decisions? 0  Walking or climbing stairs? 1  Comment Uses can, chronic pain, able to manage.  Dressing or bathing? 0  Doing errands, shopping? 1  Comment Husband drives her to appointments, grocery  Preparing Food and eating ? N  Using the Toilet? N  In the past six months, have you accidently leaked urine? Y  Comment Recent UTI  Do you have problems with loss of bowel control? N  Managing your Medications? N  Managing your Finances? N  Housekeeping or managing your Housekeeping? N     Cognitive Testing  Alert? Yes  Normal Appearance?Yes  Oriented to person? Yes  Place? Yes   Time? Yes  Recall of three objects?  Yes  Can perform simple calculations? Yes  Displays appropriate judgment?Yes  Can read the correct time from a watch face?Yes  EOL planning: Does Patient Have a Medical Advance Directive?: No     Review of Systems  Constitutional:  Negative for malaise/fatigue and weight loss.  HENT:  Negative for hearing loss and tinnitus.   Eyes:  Negative  for blurred vision and double vision.  Respiratory:  Negative for cough, shortness of breath and wheezing.   Cardiovascular:  Negative for chest pain, palpitations, orthopnea, claudication and leg swelling.  Gastrointestinal:  Negative for abdominal pain, blood in stool, constipation, diarrhea, heartburn, melena, nausea and vomiting.  Genitourinary: Negative.   Musculoskeletal:  Positive for back pain (ortho/neuro following) and joint pain (hips, improved after injections). Negative for myalgias.  Skin:  Negative for rash.  Neurological:  Negative for dizziness, tingling, sensory change, weakness and headaches.  Endo/Heme/Allergies:  Negative for polydipsia.  Psychiatric/Behavioral: Negative.  Negative for  depression, memory loss and substance abuse. The patient is not nervous/anxious and does not have insomnia.   All other systems reviewed and are negative.    Objective:     Blood pressure 90/60, pulse 69, temperature 97.9 F (36.6 C), resp. rate 17, height 5' 3" (1.6 m), weight 105 lb 4.8 oz (47.8 kg), SpO2 99 %.  General Appearance: Well nourished, alert, WD/WN, female elder  in no apparent distress. Eyes: PERRLA, EOMs, conjunctiva no swelling or erythema Sinuses: No frontal/maxillary tenderness ENT/Mouth: EACs patent / TMs  nl. Nares clear without erythema, swelling, mucoid exudates. Oral hygiene is good. No erythema, swelling, or exudate. Tongue normal, non-obstructing. Tonsils not swollen or erythematous. Hearing normal.  Neck: Supple, thyroid normal without palpable nodules. No bruits, nodes or JVD. Respiratory: Respiratory effort normal.  BS equal and clear bilateral without rales, rhonci, wheezing or stridor. Cardio: Heart sounds are normal with regular rate and rhythm and no murmurs, rubs or gallops. Peripheral pulses are normal and equal bilaterally without edema. No aortic or femoral bruits. Abdomen: Flat, soft  with nl bowel sounds. Nontender, no guarding, rebound, hernias, masses, or organomegaly.  Lymphatics: Non tender without lymphadenopathy.  Musculoskeletal: Full ROM all peripheral extremities, joint stability, 5/5 strength, and slow steady gait.  Skin: warm/dry intact Neuro: Cranial nerves intact. DTR's UE  Nl/Equal and DTR's LE's absent. Normal muscle tone, no cerebellar symptoms. Sensation decreased R foot to monofilament.  Pysch: Alert and oriented X 3, normal affect, Insight and Judgment are fair GU: defer, no concerns  Medicare Attestation I have personally reviewed: The patient's medical and social history Their use of alcohol, tobacco or illicit drugs Their current medications and supplements The patient's functional ability including ADLs,fall risks, home  safety risks, cognitive, and hearing and visual impairment Diet and physical activities Evidence for depression or mood disorders  The patient's weight, height, BMI, and visual acuity have been recorded in the chart.  I have made referrals, counseling, and provided education to the patient based on review of the above and I have provided the patient with a written personalized care plan for preventive services.     Darrol Jump, NP   12/27/2021

## 2021-12-23 NOTE — Telephone Encounter (Signed)
LM-12/23/21-Called pt/pt. Husband to complete ED discharge protocol for hallucinations/delirium. Unable to reach pt. Or husband. Left detailed message for patient to return call X2.  Total time spent: 2 min.

## 2021-12-23 NOTE — Patient Instructions (Signed)
STOP TRULICITY DO NOT GIVE INSULIN GLARGINE IF BG IS <90 RETURN TO CLINIC IN 2 WEEKS LISTEN OUT FOR WHEELCHAIR   Hypoglycemia Hypoglycemia occurs when the level of sugar (glucose) in the blood is too low. Hypoglycemia can happen in people who have or do not have diabetes. It can develop quickly, and it can be a medical emergency. For most people, a blood glucose level below 70 mg/dL (3.9 mmol/L) is considered hypoglycemia. Glucose is a type of sugar that provides the body's main source of energy. Certain hormones (insulin and glucagon) control the level of glucose in the blood. Insulin lowers blood glucose, and glucagon raises blood glucose. Hypoglycemia can result from having too much insulin in the bloodstream, or from not eating enough food that contains glucose. You may also have reactive hypoglycemia, which happens within 4 hours after eating a meal. What are the causes? Hypoglycemia occurs most often in people who have diabetes and may be caused by: Diabetes medicine. Not eating enough, or not eating often enough. Increased physical activity. Drinking alcohol on an empty stomach. If you do not have diabetes, hypoglycemia may be caused by: A tumor in the pancreas. Not eating enough, or not eating for long periods at a time (fasting). A severe infection or illness. Problems after having bariatric surgery. Organ failure, such as kidney or liver failure. Certain medicines. What increases the risk? Hypoglycemia is more likely to develop in people who: Have diabetes and take medicines to lower blood glucose. Abuse alcohol. Have a severe illness. What are the signs or symptoms? Symptoms vary depending on whether the condition is mild, moderate, or severe. Mild hypoglycemia Hunger. Sweating and feeling clammy. Dizziness or feeling light-headed. Sleepiness or restless sleep. Nausea. Increased heart rate. Headache. Blurry vision. Mood changes, such as irritability or  anxiety. Tingling or numbness around the mouth, lips, or tongue. Moderate hypoglycemia Confusion and poor judgment. Behavior changes. Weakness. Irregular heartbeat. A change in coordination. Severe hypoglycemia Severe hypoglycemia is a medical emergency. It can cause: Fainting. Seizures. Loss of consciousness (coma). Death. How is this diagnosed? Hypoglycemia is diagnosed with a blood test to measure your blood glucose level. This blood test is done while you are having symptoms. Your health care provider may also do a physical exam and review your medical history. How is this treated? This condition can be treated by immediately eating or drinking something that contains sugar with 15 grams of fast-acting carbohydrate, such as: 4 oz (120 mL) of fruit juice. 4 oz (120 mL) of regular soda (not diet soda). Several pieces of hard candy. Check food labels to find out how many pieces to eat for 15 grams. 1 Tbsp (15 mL) of sugar or honey. 4 glucose tablets. 1 tube of glucose gel. Treating hypoglycemia if you have diabetes If you are alert and able to swallow safely, follow the 15:15 rule: Take 15 grams of a fast-acting carbohydrate. Talk with your health care provider about how much you should take. Options for getting 15 grams of fast-acting carbohydrate include: Glucose tablets (take 4 tablets). Several pieces of hard candy. Check food labels to find out how many pieces to eat for 15 grams. 4 oz (120 mL) of fruit juice. 4 oz (120 mL) of regular soda (not diet soda). 1 Tbsp (15 mL) of sugar or honey. 1 tube of glucose gel. Check your blood glucose 15 minutes after you take the carbohydrate. If the repeat blood glucose level is still at or below 70 mg/dL (3.9 mmol/L), take 15  grams of a carbohydrate again. If your blood glucose level does not increase above 70 mg/dL (3.9 mmol/L) after 3 tries, seek emergency medical care. After your blood glucose level returns to normal, eat a meal or a  snack within 1 hour.  Treating severe hypoglycemia Severe hypoglycemia is when your blood glucose level is below 54 mg/dL (3 mmol/L). Severe hypoglycemia is a medical emergency. Get medical help right away. If you have severe hypoglycemia and you cannot eat or drink, you will need to be given glucagon. A family member or close friend should learn how to check your blood glucose and how to give you glucagon. Ask your health care provider if you need to have an emergency glucagon kit available. Severe hypoglycemia may need to be treated in a hospital. The treatment may include getting glucose through an IV. You may also need treatment for the cause of your hypoglycemia. Follow these instructions at home:  General instructions Take over-the-counter and prescription medicines only as told by your health care provider. Monitor your blood glucose as told by your health care provider. If you drink alcohol: Limit how much you have to: 0-1 drink a day for women who are not pregnant. 0-2 drinks a day for men. Know how much alcohol is in your drink. In the [place] one drink equals one 12 oz bottle of beer (355 mL), one 5 oz glass of wine (148 mL), or one 1 oz glass of hard liquor (44 mL). Be sure to eat food along with drinking alcohol. Be aware that alcohol is absorbed quickly and may have lingering effects that may result in hypoglycemia later. Be sure to do ongoing glucose monitoring. Keep all follow-up visits. This is important. If you have diabetes: Always have a fast-acting carbohydrate (15 grams) option with you to treat low blood glucose. Follow your diabetes management plan as directed by your health care provider. Make sure you: Know the symptoms of hypoglycemia. It is important to treat it right away to prevent it from becoming severe. Check your blood glucose as often as told. Always check before and after exercise. Always check your blood glucose before you drive a motorized vehicle. Take  your medicines as told. Follow your meal plan. Eat on time, and do not skip meals. Share your diabetes management plan with people in your workplace, school, and household. Carry a medical alert card or wear medical alert jewelry. Where to find more information American Diabetes Association: www.diabetes.org Contact a health care provider if: You have problems keeping your blood glucose in your target range. You have frequent episodes of hypoglycemia. Get help right away if: You continue to have hypoglycemia symptoms after eating or drinking something that contains 15 grams of fast-acting carbohydrate, and you cannot get your blood glucose above 70 mg/dL (3.9 mmol/L) while following the 15:15 rule. Your blood glucose is below 54 mg/dL (3 mmol/L). You have a seizure. You faint. These symptoms may represent a serious problem that is an emergency. Do not wait to see if the symptoms will go away. Get medical help right away. Call your local emergency services (911 in the [place] Do not drive yourself to the hospital. Summary Hypoglycemia occurs when the level of sugar (glucose) in the blood is too low. Hypoglycemia can happen in people who have or do not have diabetes. It can develop quickly, and it can be a medical emergency. Make sure you know the symptoms of hypoglycemia and how to treat it. Always have a fast-acting carbohydrate option  with you to treat low blood sugar. This information is not intended to replace advice given to you by your health care provider. Make sure you discuss any questions you have with your health care provider. Document Revised: 03/12/2020 Document Reviewed: 03/12/2020 Elsevier Patient Education  [place].

## 2021-12-24 ENCOUNTER — Telehealth: Payer: Self-pay | Admitting: Nurse Practitioner

## 2021-12-24 ENCOUNTER — Telehealth: Payer: Self-pay

## 2021-12-24 LAB — COMPLETE METABOLIC PANEL WITH GFR
AG Ratio: 1.4 (calc) (ref 1.0–2.5)
ALT: 20 U/L (ref 6–29)
AST: 28 U/L (ref 10–35)
Albumin: 4 g/dL (ref 3.6–5.1)
Alkaline phosphatase (APISO): 94 U/L (ref 37–153)
BUN/Creatinine Ratio: 15 (calc) (ref 6–22)
BUN: 25 mg/dL (ref 7–25)
CO2: 30 mmol/L (ref 20–32)
Calcium: 9.4 mg/dL (ref 8.6–10.4)
Chloride: 92 mmol/L — ABNORMAL LOW (ref 98–110)
Creat: 1.64 mg/dL — ABNORMAL HIGH (ref 0.60–0.95)
Globulin: 2.8 g/dL (calc) (ref 1.9–3.7)
Glucose, Bld: 106 mg/dL — ABNORMAL HIGH (ref 65–99)
Potassium: 4.1 mmol/L (ref 3.5–5.3)
Sodium: 132 mmol/L — ABNORMAL LOW (ref 135–146)
Total Bilirubin: 0.3 mg/dL (ref 0.2–1.2)
Total Protein: 6.8 g/dL (ref 6.1–8.1)
eGFR: 30 mL/min/{1.73_m2} — ABNORMAL LOW (ref >=60)

## 2021-12-24 LAB — URINALYSIS, ROUTINE W REFLEX MICROSCOPIC
Bacteria, UA: NONE SEEN /HPF
Bilirubin Urine: NEGATIVE
Glucose, UA: NEGATIVE
Hgb urine dipstick: NEGATIVE
Hyaline Cast: NONE SEEN /LPF
Ketones, ur: NEGATIVE
Nitrite: NEGATIVE
Protein, ur: NEGATIVE
RBC / HPF: NONE SEEN /HPF (ref 0–2)
Specific Gravity, Urine: 1.011 (ref 1.001–1.035)
pH: 5.5 (ref 5.0–8.0)

## 2021-12-24 LAB — URINE CULTURE
MICRO NUMBER:: 13859041
Result:: NO GROWTH
SPECIMEN QUALITY:: ADEQUATE

## 2021-12-24 LAB — MICROSCOPIC MESSAGE

## 2021-12-24 NOTE — Telephone Encounter (Signed)
I would hold insulin this am and recheck in the afternoon

## 2021-12-24 NOTE — Telephone Encounter (Signed)
Blood sugar this morning was 68 while she was eating a sausage biscuit. Does she need to take her insulin?

## 2021-12-24 NOTE — Telephone Encounter (Signed)
LM-12/24/21-Called pt. And completed ED discharge protocol.  Pt. stated she was very pleased with her ED visit and said it was very informative. Pt. reported she has not had any AMS or hallucinations since being discharged from ED. Pt. did mention her blood sugar was 68 this morning. Pt. asked what to take. Reviewed Darrol Jump NP note from yesterday and see DM medication was stopped. Pt. wanted to know what to take. Assisted pt. by transferring directly to [facility] for further assistance.   Total time spent: 15 min.

## 2021-12-26 LAB — CULTURE, BLOOD (ROUTINE X 2)
Culture: NO GROWTH
Culture: NO GROWTH

## 2021-12-28 ENCOUNTER — Telehealth: Payer: Self-pay

## 2021-12-28 NOTE — Telephone Encounter (Signed)
     Patient  visit on 8/29  at [facility] ED  Have you been able to follow up with your primary care physician?YES  The patient was or was not able to obtain any needed medicine or equipment.YES  Are there diet recommendations that you are having difficulty following?NA  Patient expresses understanding of discharge instructions and education provided has no other needs at this time. [place], [facility], Care Management  [phone] [address], [place], [place] [address] Phone: [phone] Email: [email or web address]

## 2021-12-29 NOTE — Telephone Encounter (Signed)
LM-12/29/21-called pt. To r/s CP focused outreach on 9/12 d/t pt. Having conflicting appointments. Unable to reach pt. VM box not set up. Will call pt. At another time.  Total time spent: 2 min.

## 2021-12-30 NOTE — Telephone Encounter (Signed)
LM-12/30/21-Called pt. And r/s CP focused outreach for 10/10 at 4:00PM. Informed pt. I will call her a few days prior to the visit to confirm. Pt. Verbalized understanding and agreed.  Total time spent: 8 min.

## 2021-12-31 DIAGNOSIS — L578 Other skin changes due to chronic exposure to nonionizing radiation: Secondary | ICD-10-CM | POA: Diagnosis not present

## 2021-12-31 DIAGNOSIS — L570 Actinic keratosis: Secondary | ICD-10-CM | POA: Diagnosis not present

## 2022-01-04 ENCOUNTER — Ambulatory Visit (INDEPENDENT_AMBULATORY_CARE_PROVIDER_SITE_OTHER): Payer: Medicare Other | Admitting: Nurse Practitioner

## 2022-01-04 ENCOUNTER — Encounter: Payer: Self-pay | Admitting: Nurse Practitioner

## 2022-01-04 ENCOUNTER — Ambulatory Visit: Payer: Medicare Other | Admitting: Pharmacy Technician

## 2022-01-04 ENCOUNTER — Ambulatory Visit
Admission: RE | Admit: 2022-01-04 | Discharge: 2022-01-04 | Disposition: A | Discharge status: Home / Self Care | Payer: Medicare Other | Source: Ambulatory Visit | Attending: Nurse Practitioner | Admitting: Nurse Practitioner

## 2022-01-04 VITALS — BP 146/60 | HR 58 | Temp 97.9°F | Ht 63.0 in | Wt 111.0 lb

## 2022-01-04 DIAGNOSIS — E871 Hypo-osmolality and hyponatremia: Secondary | ICD-10-CM

## 2022-01-04 DIAGNOSIS — E1165 Type 2 diabetes mellitus with hyperglycemia: Secondary | ICD-10-CM | POA: Diagnosis not present

## 2022-01-04 DIAGNOSIS — L539 Erythematous condition, unspecified: Secondary | ICD-10-CM | POA: Diagnosis not present

## 2022-01-04 DIAGNOSIS — N183 Chronic kidney disease, stage 3 unspecified: Secondary | ICD-10-CM

## 2022-01-04 DIAGNOSIS — E118 Type 2 diabetes mellitus with unspecified complications: Secondary | ICD-10-CM

## 2022-01-04 DIAGNOSIS — T148XXA Other injury of unspecified body region, initial encounter: Secondary | ICD-10-CM

## 2022-01-04 DIAGNOSIS — E1122 Type 2 diabetes mellitus with diabetic chronic kidney disease: Secondary | ICD-10-CM | POA: Diagnosis not present

## 2022-01-04 DIAGNOSIS — W19XXXA Unspecified fall, initial encounter: Secondary | ICD-10-CM | POA: Diagnosis not present

## 2022-01-04 DIAGNOSIS — Z794 Long term (current) use of insulin: Secondary | ICD-10-CM

## 2022-01-04 DIAGNOSIS — Z79899 Other long term (current) drug therapy: Secondary | ICD-10-CM

## 2022-01-04 DIAGNOSIS — R4189 Other symptoms and signs involving cognitive functions and awareness: Secondary | ICD-10-CM

## 2022-01-04 DIAGNOSIS — R8271 Bacteriuria: Secondary | ICD-10-CM | POA: Diagnosis not present

## 2022-01-04 DIAGNOSIS — R627 Adult failure to thrive: Secondary | ICD-10-CM

## 2022-01-04 DIAGNOSIS — F331 Major depressive disorder, recurrent, moderate: Secondary | ICD-10-CM

## 2022-01-04 MED ORDER — DOXYCYCLINE HYCLATE 100 MG PO CAPS: ORAL_CAPSULE | ORAL | 0 refills | Status: DC

## 2022-01-04 NOTE — Progress Notes (Signed)
FOLLOW UP  Assessment and Plan:   1. Failure to thrive in adult Possible palliative care for future needs. Continue to monitor  - Ambulatory referral to [place]  2. Open wound Cleansed and dressed in office. Start tmt with antibiotic Dress in home with sugar/betadine slurry and gauze.   Apply topical triple abx ointment to area once dry.   Keep covered at night to prevent opening. Continue to monitor for increase redness, streaking, foul smell, non-healing area. X-ray to determine any underlying osteomyelitis d/t hx of poorly controlled  DM. Refer to [facility] for wound management   - doxycycline (VIBRAMYCIN) 100 MG capsule; Take 1 capsule 2 x /day with meals for Infection  Dispense: 10 capsule; Refill: 0 - DG Tibia/Fibula Left; Future - Ambulatory referral to Home Health  3. Hyponatremia Slightly low 2 weeks ago. Continue to stay well hydrated.  - COMPLETE METABOLIC PANEL WITH GFR  4. CKD stage 3 due to type 2 diabetes mellitus ([place]) Discussed how what you eat and drink can aide in kidney protection. Stay well hydrated. Avoid NSAIDS. Keep BP and BG well controlled.   Take medications as prescribed. Remain active and exercise as tolerated daily. Continue to monitor. Check and monitor CMP/GFR/Microablumin  - COMPLETE METABOLIC PANEL WITH GFR - Ambulatory referral to [place]  5. Poorly controlled type 2 diabetes mellitus ([place]) Discussed importance of monitoring BG daily, before and after meals and when symptomatic. May need to remove insulin and control via oral medications. Continue to monitor  - COMPLETE METABOLIC PANEL WITH GFR - DG Tibia/Fibula Left; Future - Ambulatory referral to Home Health  6. Type 2 diabetes mellitus with complication, with long-term current use of insulin (HCC) Discussed lowering insulin dose by 2U now that Trulicity has been discontinued. Continue to monitor  - COMPLETE METABOLIC PANEL WITH GFR - DG Tibia/Fibula Left; Future  7.  Medication management All medications discussed and reviewed in full. All questions and concerns regarding medications addressed.    - CBC with Differential/Platelet - COMPLETE METABOLIC PANEL WITH GFR - Magnesium - Urinalysis, Routine w reflex microscopic - Urine Culture - doxycycline (VIBRAMYCIN) 100 MG capsule; Take 1 capsule 2 x /day with meals for Infection  Dispense: 10 capsule; Refill: 0 - Ambulatory referral to Home Health  8. Bacteria in urine  - CBC with Differential/Platelet - Urinalysis, Routine w reflex microscopic - Urine Culture  9. Fall, initial encounter Initiate PT, strength training, Change positions slowly. Continue to use walker.  - Ambulatory referral to Home Health  10. Cognitive decline Continue to assess for any increase in AMS, delirium, cognitive decline. Stay well hydrated. Increase fruits and veggies.  - Ambulatory referral to [place]. Depression Continue Wellbutrin, Buspar. Continue to monitor  Notify office for further evaluation and treatment, questions or concerns if s/s fail to improve. The risks and benefits of my recommendations, as well as other treatment options were discussed with the patient today. Questions were answered.   Continue diet and meds as discussed. Further disposition pending results of labs. Discussed med's effects and SE's.    Over 20 minutes of exam, counseling, chart review, and critical decision making was performed.   Future Appointments  Date Time Provider [place]  01/04/2022  1:45 PM Darrol Jump, NP [facility] None  02/01/2022  4:00 PM Carleene Mains, RPH [facility] None  09/12/2022 10:00 AM Darrol Jump, NP [facility] None  12/14/2022  2:00 PM Ubaldo Daywalt, Kenney Houseman, NP [facility] None    ----------------------------------------------------------------------------------------------------------------------  HPI  88 y.o. female  presents to clinic accompanied by husband for a two week  follow up.  She has had x2 ED visits in August (2023) and x1 end of July (2023) for abdominal pain, constipation, dizziness, UTI, and delirium.    Husband felt this to be contributed to her not eating or drinking.  Of note, she has been on Trulicity over the last several months in hopes to tighten A1c.  Considering this could be a contributor to her decline in nutritional intake and overall presentation of symptoms, the medication was stopped two weeks ago.  She also suffers from slow transit constipation d/t tramadol use, which she reports in only PRN.    She does report eating and drinking more over the last two weeks.  Husband also feels as though she has become more depressed over the last several months, losing her family.  He reports she goes to sleep around 7:30 pm and wakes around 9:00/10:00 am.  She has had multiple falls, recently fell in her bed-room last week and hit her shin.  She uses a walker.  She has an open wound to her left lower shin.  She has been placing neosporin on the wound without much improvement. Reports drainage and redness.  She is alert and oriented today.    BMI is Body mass index is 19.66 kg/m., she has not been working on diet and exercise. Wt Readings from Last 3 Encounters:  01/04/22 111 lb (50.3 kg)  12/23/21 105 lb 4.8 oz (47.8 kg)  12/13/21 105 lb 6.4 oz (47.8 kg)    Her blood pressure has been controlled at home, today their BP is BP: (!) 146/60  She does not workout. She denies chest pain, shortness of breath, dizziness.   She is on cholesterol medication Rosuvastatin and denies myalgias. Her cholesterol is not at goal. The cholesterol last visit was:   Lab Results  Component Value Date   CHOL 138 12/07/2021   HDL 48 (L) 12/07/2021   LDLCALC 67 12/07/2021   TRIG 152 (H) 12/07/2021   CHOLHDL 2.9 12/07/2021    She has not been working on diet and exercise for prediabetes, and denies hyperglycemia and visual disturbances. Last A1C in the office  was:  Lab Results  Component Value Date   HGBA1C 7.0 (H) 12/07/2021   Patient is on Vitamin D supplement.   Lab Results  Component Value Date   VD25OH 60 12/07/2021        Current Medications:  Current Outpatient Medications on File Prior to Visit  Medication Sig   AquaLance Lancets 30G MISC CHECK BLOOD SUGAR 3 TIMES A DAY   aspirin 81 MG tablet Take 81 mg by mouth daily.   bisoprolol-hydrochlorothiazide (ZIAC) 2.5-6.25 MG tablet Take  1 tablet  Daily  for BP   Blood Glucose Calibration (ACCU-CHEK AVIVA) SOLN Check blood sugar 3 times daily-DX-E11.22   Blood Glucose Monitoring Suppl (ONETOUCH VERIO FLEX SYSTEM) w/Device KIT Check blood sugar 3 times daily-E11.8   buPROPion (WELLBUTRIN XL) 150 MG 24 hr tablet Take 1 tablet every Morning for Mood , Focus & Concentration   busPIRone (BUSPAR) 5 MG tablet Take 1 tab three times a day as needed for mood/anxiety.   Cholecalciferol (VITAMIN D3) 2000 UNITS TABS Take 1 capsule by mouth 2 (two) times daily.   ciprofloxacin (CIPRO) 250 MG tablet Take  1 tablet   2 x /day  with Food for Urinary Tract  Infection   COMFORT EZ PEN NEEDLES 32G X  4 MM MISC USE 2 TIMES DAILY AS DIRECTED , 1 PEN NEEDLE PER INJECTION.   Continuous Blood Gluc Sensor (FREESTYLE LIBRE SENSOR SYSTEM) MISC Check blood sugar 3 times a day. Dx:E11.8, Z79.4   Continuous Blood Gluc Sensor MISC Check blood sugar 3 times a day- Dx: E11.8/Z79.4   docusate sodium (COLACE) 100 MG capsule Take 1 capsule twice daily as needed for constipation with a bottle of water.   dorzolamide-timolol (COSOPT) 22.3-6.8 MG/ML ophthalmic solution Place 1 drop into both eyes 2 (two) times daily.   Dulaglutide (TRULICITY) 1.5 PJ/8.2NK SOPN INJECT 0.5ML (1.5MG) SUBCUTANEOUSLY ONCE WEEKLY (Patient not taking: Reported on 01/04/2022)   DULoxetine (CYMBALTA) 20 MG capsule Take 1 cap daily for mood and chronic pain.   EASY COMFORT INSULIN SYRINGE 30G X 1/2" 0.5 ML MISC    gabapentin (NEURONTIN) 100 MG capsule  Take 1-2 caps 1 hour prior to bedtime as needed for pain and sleep.   glucose blood (ONETOUCH VERIO) test strip USE 1 STRIP TO CHECK GLUCOSE THREE TIMES DAILY AS DIRECTED   Insulin Glargine (BASAGLAR KWIKPEN) 100 UNIT/ML DIAL AND INJECT 30 UNITS UNDER THE SKIN ONCE DAILY AS DIRECTED   ipratropium (ATROVENT) 0.03 % nasal spray Place 2 sprays into the nose 3 (three) times daily.   Lancet Devices (ADJUSTABLE LANCING DEVICE) MISC CHECK BLOOD SUGAR 3 TIMES A DAY   latanoprost (XALATAN) 0.005 % ophthalmic solution Place 1 drop into both eyes at bedtime.   linaclotide (LINZESS) 72 MCG capsule Start taking 1 cap daily for constipation.   meclizine (ANTIVERT) 25 MG tablet Take 0.5-1 tablets (12.5-25 mg total) by mouth 3 (three) times daily as needed for dizziness.   methocarbamol (ROBAXIN) 500 MG tablet Take 500 mg by mouth 4 (four) times daily.   Multiple Vitamins-Minerals (CENTRUM SILVER 50+WOMEN) TABS Take 1 tablet by mouth daily.   Multiple Vitamins-Minerals (ICAPS AREDS 2 PO) Take 1 capsule by mouth daily.   rOPINIRole (REQUIP) 3 MG tablet Take 1 to 2 tablets  at Bedtime  for Restless Legs                                                               /                     TAKE  BY MOUTH   rosuvastatin (CRESTOR) 20 MG tablet Take  1 tablet  Daily for Cholesterol                                    /                              TAKE                         BYMOUTH                   ONCEDAILY   traMADol (ULTRAM) 50 MG tablet Take 50 mg by mouth every 6 (six) hours as needed for moderate pain. Pt only takes 2 pills per night   vitamin B-12 (CYANOCOBALAMIN) 1000 MCG tablet Take 1,000 mcg  by mouth daily.   No current facility-administered medications on file prior to visit.     Allergies:  Allergies  Allergen Reactions   Morphine And Related Other (See Comments)    fuzzy feeling     Medical History:  Past Medical History:  Diagnosis Date   Arthritis    Chronic back pain    Cystoid macular edema  of right eye 08/19/2019   Diabetic neuropathy (HCC)    Exudative age-related macular degeneration of left eye with inactive choroidal neovascularization ([place]) 11/18/2019   Hypercholesteremia    Hypertension    Liver lesion    Neuropathy    Type II or unspecified type diabetes mellitus without mention of complication, not stated as uncontrolled    Vitamin D deficiency    Wears glasses    Family history- Reviewed and unchanged Social history- Reviewed and unchanged   Review of Systems: All review systems reviewed and negative except for pertinent positives in history of present illness.   Physical Exam: BP (!) 146/60   Pulse (!) 58   Temp 97.9 F (36.6 C)   Ht _0  (1.6 m)   Wt 111 lb (50.3 kg)   SpO2 99%   BMI 19.66 kg/m  Wt Readings from Last 3 Encounters:  01/04/22 111 lb (50.3 kg)  12/23/21 105 lb 4.8 oz (47.8 kg)  12/13/21 105 lb 6.4 oz (47.8 kg)   General Appearance: Well nourished, in no apparent distress. Eyes: PERRLA, EOMs, conjunctiva no swelling or erythema Sinuses: No Frontal/maxillary tenderness ENT/Mouth: Ext aud canals clear, TMs without erythema, bulging. No erythema, swelling, or exudate on post pharynx.  Tonsils not swollen or erythematous. Hearing normal.  Neck: Supple, thyroid normal.  Respiratory: Respiratory effort normal, BS equal bilaterally without rales, rhonchi, wheezing or stridor.  Cardio: RRR with no MRGs. Brisk peripheral pulses without edema.  Abdomen: Soft, + BS.  Non tender, no guarding, rebound, hernias, masses. Lymphatics: Non tender without lymphadenopathy.  Musculoskeletal: Full ROM, 5/5 strength, Ambulates with walker gait Skin: LLE with approximately 3 mm round red serosanginous draining wound.  Surrounding skin erythematous and warm. No pus, malodor.  Neuro: Cranial nerves intact. No cerebellar symptoms.  Psych: Awake and oriented X 3, normal affect, Insight and Judgment appropriate.    Darrol Jump, NP 12:26 PM [facility]  Adult [facility] Adolescent Internal Medicine

## 2022-01-04 NOTE — Patient Instructions (Signed)
Start Doxycycline antibiotic for wound care Listen out for Home Health Nurse  Wound Care, Adult Taking care of your wound properly can help to prevent pain, infection, and scarring. It can also help your wound heal more quickly. Follow instructions from your health care provider about how to care for your wound. Supplies needed: Soap and water. Wound cleanser, saline, or germ-free (sterile) water. Gauze. If needed, a clean bandage (dressing) or other type of wound dressing material to cover or place in the wound. Follow your health care provider's instructions about what dressing supplies to use. Cream or topical ointment to apply to the wound, if told by your health care provider. How to care for your wound Cleaning the wound Ask your health care provider how to clean the wound. This may include: Using mild soap and water, a wound cleanser, saline, or sterile water. Using a clean gauze to pat the wound dry after cleaning it. Do not rub or scrub the wound. Dressing care Wash your hands with soap and water for at least 20 seconds before and after you change the dressing. If soap and water are not available, use hand sanitizer. Change your dressing as told by your health care provider. This may include: Cleaning or rinsing out (irrigating) the wound. Application of cream or topical ointment, if told by your health care provider. Placing a dressing over the wound or in the wound (packing). Covering the wound with an outer dressing. Leave stitches (sutures), staples, skin glue, or adhesive strips in place. These skin closures may need to stay in place for 2 weeks or longer. If adhesive strip edges start to loosen and curl up, you may trim the loose edges. Do not remove adhesive strips completely unless your health care provider tells you to do that. Ask your health care provider when you can leave the wound uncovered. Checking for infection Check your wound area every day for signs of  infection. Check for: More redness, swelling, or pain. Fluid or blood. Warmth. Pus or a bad smell.  Follow these instructions at home Medicines If you were prescribed an antibiotic medicine, cream, or ointment, take or apply it as told by your health care provider. Do not stop using the antibiotic even if your condition improves. If you were prescribed pain medicine, take it 30 minutes before you do any wound care or as told by your health care provider. Take over-the-counter and prescription medicines only as told by your health care provider. Eating and drinking Eat a diet that includes protein, vitamin A, vitamin C, and other nutrient-rich foods to help the wound heal. Foods rich in protein include meat, fish, eggs, dairy, beans, and nuts. Foods rich in vitamin A include carrots and dark green, leafy vegetables. Foods rich in vitamin C include citrus fruits, tomatoes, broccoli, and peppers. Drink enough fluid to keep your urine pale yellow. General instructions Do not take baths, swim, or use a hot tub until your health care provider approves. Ask your health care provider if you may take showers. You may only be allowed to take sponge baths. Do not scratch or pick at the wound. Keep it covered as told by your health care provider. Return to your normal activities as told by your health care provider. Ask your health care provider what activities are safe for you. Protect your wound from the sun when you are outside for the first 6 months, or for as long as told by your health care provider. Cover up the scar area  or apply sunscreen that has an SPF of at least 4. Do not use any products that contain nicotine or tobacco. These products include cigarettes, chewing tobacco, and vaping devices, such as e-cigarettes. If you need help quitting, ask your health care provider. Keep all follow-up visits. This is important. Contact a health care provider if: You received a tetanus shot and you  have swelling, severe pain, redness, or bleeding at the injection site. Your pain is not controlled with medicine. You have any of these signs of infection: More redness, swelling, or pain around the wound. Fluid or blood coming from the wound. Warmth coming from the wound. A fever or chills. You are nauseous or you vomit. You are dizzy. You have a new rash or hardness around the wound. Get help right away if: You have a red streak of skin near the area around your wound. Pus or a bad smell coming from the wound. Your wound has been closed with staples, sutures, skin glue, or adhesive strips and it begins to open up and separate. Your wound is bleeding, and the bleeding does not stop with gentle pressure. These symptoms may represent a serious problem that is an emergency. Do not wait to see if the symptoms will go away. Get medical help right away. Call your local emergency services (911 in the [place] Do not drive yourself to the hospital. Summary Always wash your hands with soap and water for at least 20 seconds before and after changing your dressing. Change your dressing as told by your health care provider. To help with healing, eat foods that are rich in protein, vitamin A, vitamin C, and other nutrients. Check your wound every day for signs of infection. Contact your health care provider if you think that your wound is infected. This information is not intended to replace advice given to you by your health care provider. Make sure you discuss any questions you have with your health care provider. Document Revised: 08/18/2020 Document Reviewed: 08/18/2020 Elsevier Patient Education  [place].

## 2022-01-05 LAB — CBC WITH DIFFERENTIAL/PLATELET
Absolute Monocytes: 846 cells/uL (ref 200–950)
Basophils Absolute: 47 cells/uL (ref 0–200)
Basophils Relative: 0.5 %
Eosinophils Absolute: 316 cells/uL (ref 15–500)
Eosinophils Relative: 3.4 %
HCT: 32.5 % — ABNORMAL LOW (ref 35.0–45.0)
Hemoglobin: 10.9 g/dL — ABNORMAL LOW (ref 11.7–15.5)
Lymphs Abs: 2399 cells/uL (ref 850–3900)
MCH: 31.5 pg (ref 27.0–33.0)
MCHC: 33.5 g/dL (ref 32.0–36.0)
MCV: 93.9 fL (ref 80.0–100.0)
MPV: 11.2 fL (ref 7.5–12.5)
Monocytes Relative: 9.1 %
Neutro Abs: 5692 cells/uL (ref 1500–7800)
Neutrophils Relative %: 61.2 %
Platelets: 305 10*3/uL (ref 140–400)
RBC: 3.46 10*6/uL — ABNORMAL LOW (ref 3.80–5.10)
RDW: 11.8 % (ref 11.0–15.0)
Total Lymphocyte: 25.8 %
WBC: 9.3 10*3/uL (ref 3.8–10.8)

## 2022-01-05 LAB — COMPLETE METABOLIC PANEL WITH GFR
AG Ratio: 1.3 (calc) (ref 1.0–2.5)
ALT: 12 U/L (ref 6–29)
AST: 20 U/L (ref 10–35)
Albumin: 3.8 g/dL (ref 3.6–5.1)
Alkaline phosphatase (APISO): 98 U/L (ref 37–153)
BUN/Creatinine Ratio: 11 (calc) (ref 6–22)
BUN: 15 mg/dL (ref 7–25)
CO2: 31 mmol/L (ref 20–32)
Calcium: 9.4 mg/dL (ref 8.6–10.4)
Chloride: 103 mmol/L (ref 98–110)
Creat: 1.31 mg/dL — ABNORMAL HIGH (ref 0.60–0.95)
Globulin: 2.9 g/dL (calc) (ref 1.9–3.7)
Glucose, Bld: 219 mg/dL — ABNORMAL HIGH (ref 65–99)
Potassium: 5.1 mmol/L (ref 3.5–5.3)
Sodium: 142 mmol/L (ref 135–146)
Total Bilirubin: 0.3 mg/dL (ref 0.2–1.2)
Total Protein: 6.7 g/dL (ref 6.1–8.1)
eGFR: 39 mL/min/{1.73_m2} — ABNORMAL LOW (ref >=60)

## 2022-01-05 LAB — URINALYSIS, ROUTINE W REFLEX MICROSCOPIC
Bilirubin Urine: NEGATIVE
Hgb urine dipstick: NEGATIVE
Ketones, ur: NEGATIVE
Leukocytes,Ua: NEGATIVE
Nitrite: NEGATIVE
Protein, ur: NEGATIVE
Specific Gravity, Urine: 1.013 (ref 1.001–1.035)
pH: 7 (ref 5.0–8.0)

## 2022-01-05 LAB — MAGNESIUM: Magnesium: 1.9 mg/dL (ref 1.5–2.5)

## 2022-01-05 LAB — URINE CULTURE
MICRO NUMBER:: 13906201
Result:: NO GROWTH
SPECIMEN QUALITY:: ADEQUATE

## 2022-01-06 DIAGNOSIS — M7061 Trochanteric bursitis, right hip: Secondary | ICD-10-CM | POA: Diagnosis not present

## 2022-01-06 DIAGNOSIS — M47816 Spondylosis without myelopathy or radiculopathy, lumbar region: Secondary | ICD-10-CM | POA: Diagnosis not present

## 2022-01-06 DIAGNOSIS — M48061 Spinal stenosis, lumbar region without neurogenic claudication: Secondary | ICD-10-CM | POA: Diagnosis not present

## 2022-01-06 DIAGNOSIS — M5136 Other intervertebral disc degeneration, lumbar region: Secondary | ICD-10-CM | POA: Diagnosis not present

## 2022-01-12 ENCOUNTER — Encounter (INDEPENDENT_AMBULATORY_CARE_PROVIDER_SITE_OTHER): Payer: Self-pay | Admitting: Ophthalmology

## 2022-01-12 ENCOUNTER — Ambulatory Visit (INDEPENDENT_AMBULATORY_CARE_PROVIDER_SITE_OTHER): Payer: Medicare Other | Admitting: Ophthalmology

## 2022-01-12 DIAGNOSIS — H353221 Exudative age-related macular degeneration, left eye, with active choroidal neovascularization: Secondary | ICD-10-CM

## 2022-01-12 DIAGNOSIS — H353222 Exudative age-related macular degeneration, left eye, with inactive choroidal neovascularization: Secondary | ICD-10-CM

## 2022-01-12 MED ORDER — BEVACIZUMAB CHEMO INJECTION 1.25MG/0.05ML SYRINGE FOR KALEIDOSCOPE
2.5000 mg | INTRAVITREAL | Status: AC | PRN
  Administered 2022-01-12: 2.5 mg via INTRAVITREAL

## 2022-01-12 NOTE — Progress Notes (Signed)
01/12/2022     CHIEF COMPLAINT Patient presents for  Chief Complaint  Patient presents with   Macular Degeneration      HISTORY OF PRESENT ILLNESS: [name] is a 88 y.o. female who presents to the clinic today for:   HPI   12 weeks for DILATE OS, AVASTIN OCT. Pt stated no changes in vision.  Last edited by Silvestre Moment on 01/12/2022 10:50 AM.      Referring physician: [name], MD [address] [place],  [place] [address]  HISTORICAL INFORMATION:   Selected notes from the MEDICAL RECORD NUMBER    Lab Results  Component Value Date   HGBA1C 7.0 (H) 12/07/2021     CURRENT MEDICATIONS: Current Outpatient Medications (Ophthalmic Drugs)  Medication Sig   dorzolamide-timolol (COSOPT) 22.3-6.8 MG/ML ophthalmic solution Place 1 drop into both eyes 2 (two) times daily.   latanoprost (XALATAN) 0.005 % ophthalmic solution Place 1 drop into both eyes at bedtime.   No current facility-administered medications for this visit. (Ophthalmic Drugs)   Current Outpatient Medications (Other)  Medication Sig   AquaLance Lancets 30G MISC CHECK BLOOD SUGAR 3 TIMES A DAY   aspirin 81 MG tablet Take 81 mg by mouth daily.   bisoprolol-hydrochlorothiazide (ZIAC) 2.5-6.25 MG tablet Take  1 tablet  Daily  for BP   Blood Glucose Calibration (ACCU-CHEK AVIVA) SOLN Check blood sugar 3 times daily-DX-E11.22   Blood Glucose Monitoring Suppl (ONETOUCH VERIO FLEX SYSTEM) w/Device KIT Check blood sugar 3 times daily-E11.8   buPROPion (WELLBUTRIN XL) 150 MG 24 hr tablet Take 1 tablet every Morning for Mood , Focus & Concentration   busPIRone (BUSPAR) 5 MG tablet Take 1 tab three times a day as needed for mood/anxiety.   Cholecalciferol (VITAMIN D3) 2000 UNITS TABS Take 1 capsule by mouth 2 (two) times daily.   ciprofloxacin (CIPRO) 250 MG tablet Take  1 tablet   2 x /day  with Food for Urinary Tract  Infection   COMFORT EZ PEN NEEDLES 32G X 4 MM MISC USE 2 TIMES DAILY AS  DIRECTED , 1 PEN NEEDLE PER INJECTION.   Continuous Blood Gluc Sensor (FREESTYLE LIBRE SENSOR SYSTEM) MISC Check blood sugar 3 times a day. Dx:E11.8, Z79.4   Continuous Blood Gluc Sensor MISC Check blood sugar 3 times a day- Dx: E11.8/Z79.4   docusate sodium (COLACE) 100 MG capsule Take 1 capsule twice daily as needed for constipation with a bottle of water.   doxycycline (VIBRAMYCIN) 100 MG capsule Take 1 capsule 2 x /day with meals for Infection   Dulaglutide (TRULICITY) 1.5 DT/2.6ZT SOPN INJECT 0.5ML (1.5MG) SUBCUTANEOUSLY ONCE WEEKLY (Patient not taking: Reported on 01/04/2022)   DULoxetine (CYMBALTA) 20 MG capsule Take 1 cap daily for mood and chronic pain.   EASY COMFORT INSULIN SYRINGE 30G X 1/2" 0.5 ML MISC    gabapentin (NEURONTIN) 100 MG capsule Take 1-2 caps 1 hour prior to bedtime as needed for pain and sleep.   glucose blood (ONETOUCH VERIO) test strip USE 1 STRIP TO CHECK GLUCOSE THREE TIMES DAILY AS DIRECTED   Insulin Glargine (BASAGLAR KWIKPEN) 100 UNIT/ML DIAL AND INJECT 30 UNITS UNDER THE SKIN ONCE DAILY AS DIRECTED   ipratropium (ATROVENT) 0.03 % nasal spray Place 2 sprays into the nose 3 (three) times daily.   Lancet Devices (ADJUSTABLE LANCING DEVICE) MISC CHECK BLOOD SUGAR 3 TIMES A DAY   linaclotide (LINZESS) 72 MCG capsule Start taking 1 cap daily for constipation.   meclizine (ANTIVERT)  25 MG tablet Take 0.5-1 tablets (12.5-25 mg total) by mouth 3 (three) times daily as needed for dizziness.   methocarbamol (ROBAXIN) 500 MG tablet Take 500 mg by mouth 4 (four) times daily.   Multiple Vitamins-Minerals (CENTRUM SILVER 50+WOMEN) TABS Take 1 tablet by mouth daily.   Multiple Vitamins-Minerals (ICAPS AREDS 2 PO) Take 1 capsule by mouth daily.   rOPINIRole (REQUIP) 3 MG tablet Take 1 to 2 tablets  at Bedtime  for Restless Legs                                                               /                     TAKE  BY MOUTH   rosuvastatin (CRESTOR) 20 MG tablet Take  1 tablet   Daily for Cholesterol                                    /                              TAKE                         BYMOUTH                   ONCEDAILY   traMADol (ULTRAM) 50 MG tablet Take 50 mg by mouth every 6 (six) hours as needed for moderate pain. Pt only takes 2 pills per night   vitamin B-12 (CYANOCOBALAMIN) 1000 MCG tablet Take 1,000 mcg by mouth daily.   No current facility-administered medications for this visit. (Other)      REVIEW OF SYSTEMS: ROS   Negative for: Constitutional, Gastrointestinal, Neurological, Skin, Genitourinary, Musculoskeletal, HENT, Endocrine, Cardiovascular, Eyes, Respiratory, Psychiatric, Allergic/Imm, Heme/Lymph Last edited by Silvestre Moment on 01/12/2022 10:50 AM.       ALLERGIES Allergies  Allergen Reactions   Morphine And Related Other (See Comments)    fuzzy feeling    PAST MEDICAL HISTORY Past Medical History:  Diagnosis Date   Arthritis    Chronic back pain    Cystoid macular edema of right eye 08/19/2019   Diabetic neuropathy (HCC)    Exudative age-related macular degeneration of left eye with inactive choroidal neovascularization ([place]) 11/18/2019   Hypercholesteremia    Hypertension    Liver lesion    Neuropathy    Type II or unspecified type diabetes mellitus without mention of complication, not stated as uncontrolled    Vitamin D deficiency    Wears glasses    Past Surgical History:  Procedure Laterality Date   ABDOMINAL HYSTERECTOMY     APPENDECTOMY     BACK SURGERY  1993   lumb lam-fusion   BREAST EXCISIONAL BIOPSY Right 1967   benign   CESAREAN SECTION     x 3    COLONOSCOPY     EYE SURGERY     both cataracts   TRIGGER FINGER RELEASE  01/10/2012   Procedure: RELEASE TRIGGER FINGER/A-1 PULLEY;  Surgeon: Hessie Dibble, MD;  Location: [facility];  Service: Orthopedics;  Laterality: Right;  right ring trigger release    FAMILY HISTORY Family History  Problem Relation Age of Onset   Diabetes Sister     Liver disease Sister    Liver cancer Sister    Lymphoma Sister    Stroke Brother    Diabetes Brother    Kidney cancer Brother    Colon cancer Neg Hx     SOCIAL HISTORY Social History   Tobacco Use   Smoking status: Never   Smokeless tobacco: Never  Substance Use Topics   Alcohol use: No   Drug use: No         OPHTHALMIC EXAM:  Base Eye Exam     Visual Acuity (ETDRS)       Right Left   Dist cc CF at 3' 20/150    Correction: Glasses         Tonometry (Tonopen, 10:56 AM)       Right Left   Pressure 16 15         Pupils       Pupils APD   Right PERRL None   Left PERRL None         Visual Fields       Left Right   Restrictions Partial inner superior temporal, inferior temporal, superior nasal, inferior nasal deficiencies Partial inner superior temporal, inferior temporal, superior nasal, inferior nasal deficiencies         Extraocular Movement       Right Left    Full Full         Neuro/Psych     Oriented x3: Yes   Mood/Affect: Normal         Dilation     Left eye: 2.5% Phenylephrine, 1.0% Mydriacyl @ 10:56 AM           Slit Lamp and Fundus Exam     External Exam       Right Left   External Normal Normal         Slit Lamp Exam       Right Left   Lids/Lashes Normal Normal   Conjunctiva/Sclera White and quiet White and quiet   Cornea Clear Clear   Anterior Chamber Deep and quiet Deep and quiet   Iris Round and reactive Round and reactive   Lens Posterior chamber intraocular lens Posterior chamber intraocular lens   Anterior Vitreous Normal Normal         Fundus Exam       Right Left   Posterior Vitreous  Posterior vitreous detachment   Disc  Normal   C/D Ratio  0.45   Macula  Geographic atrophy 2 DA size, Advanced age related macular degeneration, no macular thickening, Retinal pigment epithelial mottling, reddish hue in the parafoveal region, not in the FAZ   Vessels  Normal, no DR   Periphery  Normal             IMAGING AND PROCEDURES  Imaging and Procedures for 01/12/22  OCT, Retina - OU - Both Eyes       Right Eye Quality was good. Scan locations included subfoveal. Central Foveal Thickness: 247. Progression has been stable. Findings include no IRF, no SRF, abnormal foveal contour, disciform scar, central retinal atrophy, inner retinal atrophy, outer retinal atrophy.   Left Eye Quality was good. Scan locations included subfoveal. Central Foveal Thickness: 251. Progression has improved. Findings include no IRF, no SRF, abnormal foveal contour, cystoid macular edema, disciform scar, central retinal atrophy, inner retinal  atrophy, outer retinal atrophy.   Notes OS  much less intraretinal fluid subretinal fluid at 12-week follow-up post Avastin signifying decreased disease activity CNVM.  With small area of subretinal hemorrhage superior to the FAZ OD.  Intraretinal hemorrhage temporally as well, overall improved and stable at 12-week interval.  Much less retinal thickening post recent Avastin            Intravitreal Injection, Pharmacologic Agent - OS - Left Eye       Time Out 01/12/2022. 11:58 AM. Confirmed correct patient, procedure, site, and patient consented.   Anesthesia Topical anesthesia was used. Anesthetic medications included Lidocaine 4%.   Procedure Preparation included 5% betadine to ocular surface, 10% betadine to eyelids, Tobramycin 0.3%. A 30 gauge needle was used.   Injection: 2.5 mg Bevacizumab 1.60m/0.05ml   Route: Intravitreal, Site: Left Eye   NDC: [id number] Lot: [id number]  Post-op Post injection exam found visual acuity of at least counting fingers. The patient tolerated the procedure well. There were no complications. The patient received written and verbal post procedure care education. Post injection medications were not given.              ASSESSMENT/PLAN:  No problem-specific Assessment & Plan notes found for this  encounter.      ICD-10-CM   1. Exudative age-related macular degeneration of left eye with active choroidal neovascularization (HCC)  H35.3221 Intravitreal Injection, Pharmacologic Agent - OS - Left Eye    Bevacizumab (AVASTIN) SOLN 2.5 mg    2. 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)  H35.3222 OCT, Retina - OU - Both Eyes      1.  OD, stable lesion overall.  2.  OS much improved macular lesion and stabilized visual acuity post injection Avastin and maintained at 12-week interval.  Repeat injection OS today and reevaluate OU in 12 weeks  3.  Ophthalmic Meds Ordered this visit:  Meds ordered this encounter  Medications   Bevacizumab (AVASTIN) SOLN 2.5 mg       Return in about 12 weeks (around 04/06/2022) for DILATE OU, COLOR FP, AVASTIN OCT, OS.  There are no Patient Instructions on file for this visit.   Explained the diagnoses, plan, and follow up with the patient and they expressed understanding.  Patient expressed understanding of the importance of proper follow up care.   GClent DemarkRankin M.D. Diseases & Surgery of the Retina and Vitreous [facility]ina [facility] Diabetic EPink [place]09/20/23     Abbreviations: M myopia (nearsighted); A astigmatism; H hyperopia (farsighted); P presbyopia; Mrx spectacle prescription;  CTL contact lenses; OD right eye; OS left eye; OU both eyes  XT exotropia; ET esotropia; PEK punctate epithelial keratitis; PEE punctate epithelial erosions; DES dry eye syndrome; MGD meibomian gland dysfunction; ATs artificial tears; PFAT's preservative free artificial tears; NFort Recoverynuclear sclerotic cataract; PSC posterior subcapsular cataract; ERM epi-retinal membrane; PVD posterior vitreous detachment; RD retinal detachment; DM diabetes mellitus; DR diabetic retinopathy; NPDR non-proliferative diabetic retinopathy; PDR proliferative diabetic retinopathy; CSME clinically significant macular edema; DME diabetic  macular edema; dbh dot blot hemorrhages; CWS cotton wool spot; POAG primary open angle glaucoma; C/D cup-to-disc ratio; HVF humphrey visual field; GVF goldmann visual field; OCT optical coherence tomography; IOP intraocular pressure; BRVO Branch retinal vein occlusion; CRVO central retinal vein occlusion; CRAO central retinal artery occlusion; BRAO branch retinal artery occlusion; RT retinal tear; SB scleral buckle; PPV pars plana vitrectomy; VH Vitreous hemorrhage; PRP panretinal laser photocoagulation; IVK intravitreal kenalog; VMT  vitreomacular traction; MH Macular hole;  NVD neovascularization of the disc; NVE neovascularization elsewhere; AREDS age related eye disease study; ARMD age related macular degeneration; POAG primary open angle glaucoma; EBMD epithelial/anterior basement membrane dystrophy; ACIOL anterior chamber intraocular lens; IOL intraocular lens; PCIOL posterior chamber intraocular lens; Phaco/IOL phacoemulsification with intraocular lens placement; [place] photorefractive keratectomy; LASIK laser assisted in situ keratomileusis; HTN hypertension; DM diabetes mellitus; COPD chronic obstructive pulmonary disease

## 2022-01-12 NOTE — Patient Instructions (Signed)
Patient to call 2 weeks prior to next appointment to confirm participation with [place] healthcare plan

## 2022-01-20 DIAGNOSIS — M47816 Spondylosis without myelopathy or radiculopathy, lumbar region: Secondary | ICD-10-CM | POA: Diagnosis not present

## 2022-01-25 ENCOUNTER — Telehealth: Payer: Self-pay

## 2022-01-25 NOTE — Telephone Encounter (Signed)
LM-01/25/22-Chart review started. Reviewing OV, Consults, Hospital visits, Labs and medication changes for upcoming focused outreach call for 10/10. Updated FOC protocol with pts. Revent OV's and medication changes. Still need to complete medication adherence review at another time.  Total time spent: 41 min.

## 2022-01-26 NOTE — Telephone Encounter (Signed)
LM-01/26/22-Called pt. And confirmed upcoming CP focused outreach office visit for 10/10. Pt. Voiced concern about her blood sugar. Pt. Stated "We went out to eat this morning and was talking to another diabetic family who was asking if blood sugar runs high, and wanted to know how to bring a high blood sugar down. Informed pt. To avoid high sugar foods, exercise, and asked pt. And confirmed she is taking insulin everyday.   Pt. Stated "If it's high I still take some insulin, but I don't know what the requirements are if it is too high." Educated pt.- drink water and try eating a high protein snack, and take insulin as prescribed. Informed pt. Sometimes insulin dosages may need to be adjusted and informed her high blood sugar occurs when your body has too little insulin, or your body can't use insulin properly, and that administering insulin can bring your blood sugar levels down. Asked pt. How often she checks her blood sugar, and she informed me she usually checks 3 times  a day. Recommended pt. To check at minimum after 30 min. After eating a meal. Pt. Informed me she usually checks her blood sugar fasting.   Pt. Denied any nausea, stomach pain. Pt. Stated "I tell you what, lets just make a note of this and we will just go over it on the 10th." Offered to connect pt. To [facility] directly and asked pt, what her blood sugar levels have been running. Pt. Stated before eating it was 96 this morning (fasting). And it was 200 something last week after eating and expressed concern on how to get it down. Informed pt. To eat a high protein snack and water such as apples and peanut butter, greek yogurt, veggies with nut butter, eggs. Pt. Verbalized understanding and agreed.  Pt. Stated she has no symptoms when her blood sugar is high, and noted she rarely has a high reading .Advised pt. To call if she has anymore high readings, or if she experiences any symptoms. Pt. Verbalized understanding and agreed. Pt. Stated it does  not happen often, but just wanted to know how to better control her blood sugar.Informed pt. CP will discuss her blood sugar with her at her visit on 10/10 and  advised pt. To call if she has consistently high blood sugar readings, frequent urination, increased thirst, nausea/vomiting, or confusion. Pt. verbalized understanding and agreed.  Total time spent: 34 min.

## 2022-01-31 ENCOUNTER — Encounter: Payer: Self-pay | Admitting: Nurse Practitioner

## 2022-01-31 ENCOUNTER — Ambulatory Visit (INDEPENDENT_AMBULATORY_CARE_PROVIDER_SITE_OTHER): Payer: Medicare Other | Admitting: Nurse Practitioner

## 2022-01-31 VITALS — BP 132/60 | HR 57 | Temp 97.7°F | Ht 63.0 in | Wt 109.6 lb

## 2022-01-31 DIAGNOSIS — M62830 Muscle spasm of back: Secondary | ICD-10-CM

## 2022-01-31 DIAGNOSIS — G8929 Other chronic pain: Secondary | ICD-10-CM

## 2022-01-31 DIAGNOSIS — N183 Chronic kidney disease, stage 3 unspecified: Secondary | ICD-10-CM

## 2022-01-31 DIAGNOSIS — M25552 Pain in left hip: Secondary | ICD-10-CM

## 2022-01-31 DIAGNOSIS — M25551 Pain in right hip: Secondary | ICD-10-CM | POA: Diagnosis not present

## 2022-01-31 DIAGNOSIS — E1122 Type 2 diabetes mellitus with diabetic chronic kidney disease: Secondary | ICD-10-CM

## 2022-01-31 MED ORDER — METHOCARBAMOL 500 MG PO TABS: 500.0000 mg | ORAL_TABLET | Freq: Four times a day (QID) | ORAL | 0 refills | Status: DC

## 2022-01-31 NOTE — Patient Instructions (Addendum)
Methocarbamol Tablets- muscle relaxant- please take 4 times a day for the next week  Use heat/ice to the area  If no improvement or develop fever or worsening pain go to the ER   What is this medication? METHOCARBAMOL (meth oh KAR ba mole) treats muscle pain and stiffness. It works by calming overactive nerves in your body, which helps your muscles relax. It belongs to a group of medications called muscle relaxants. This medicine may be used for other purposes; ask your health care provider or pharmacist if you have questions. COMMON BRAND NAME(S): Robaxin What should I tell my care team before I take this medication? They need to know if you have any of these conditions: Kidney disease Seizures An unusual or allergic reaction to methocarbamol, other medications, foods, dyes, or preservatives Pregnant or trying to get pregnant Breast-feeding How should I use this medication? Take this medication by mouth with a full glass of water. Follow the directions on the prescription label. Take your medication at regular intervals. Do not take your medication more often than directed. Talk to your care team about the use of this medication in children. Special care may be needed. Overdosage: If you think you have taken too much of this medicine contact a poison control center or emergency room at once. NOTE: This medicine is only for you. Do not share this medicine with others. What if I miss a dose? If you miss a dose, take it as soon as you can. If it is almost time for your next dose, take only the next dose. Do not take double or extra doses. What may interact with this medication? Do not take this medication with any of the following: Narcotic medications for cough This medication may also interact with the following: Alcohol Antihistamines for allergy, cough and cold Certain medications for anxiety or sleep Certain medications for depression like amitriptyline, fluoxetine,  sertraline Certain medications for seizures like phenobarbital, primidone Cholinesterase inhibitors like neostigmine, ambenonium, and pyridostigmine bromide General anesthetics like halothane, isoflurane, methoxyflurane, propofol Local anesthetics like lidocaine, pramoxine, tetracaine Medications that relax muscles for surgery Narcotic medications for pain Phenothiazines like chlorpromazine, mesoridazine, prochlorperazine, thioridazine This list may not describe all possible interactions. Give your health care provider a list of all the medicines, herbs, non-prescription drugs, or dietary supplements you use. Also tell them if you smoke, drink alcohol, or use illegal drugs. Some items may interact with your medicine. What should I watch for while using this medication? Tell your care team if your symptoms do not start to get better or if they get worse. You may get drowsy or dizzy. Do not drive, use machinery, or do anything that needs mental alertness until you know how this medication affects you. Do not stand or sit up quickly, especially if you are an older patient. This reduces the risk of dizzy or fainting spells. Alcohol may interfere with the effect of this medication. Avoid alcoholic drinks. If you are taking another medication that also causes drowsiness, you may have more side effects. Give your care team a list of all medications you use. Your care team will tell you how much medication to take. Do not take more medication than directed. Call emergency for help if you have problems breathing or unusual sleepiness. What side effects may I notice from receiving this medication? Side effects that you should report to your care team as soon as possible: Allergic reactions--skin rash, itching, hives, swelling of the face, lips, tongue, or throat CNS depression--slow  or shallow breathing, shortness of breath, feeling faint, dizziness, confusion, trouble staying awake Side effects that usually  do not require medical attention (report to your care team if they continue or are bothersome): Dizziness Drowsiness Headache Metallic taste in mouth Upset stomach This list may not describe all possible side effects. Call your doctor for medical advice about side effects. You may report side effects to FDA at 1-800-FDA-1088. Where should I keep my medication? Keep out of the reach of children. Store at room temperature between 20 and 25 degrees C (68 and 77 degrees F). Keep container tightly closed. Throw away any unused medication after the expiration date. NOTE: This sheet is a summary. It may not cover all possible information. If you have questions about this medicine, talk to your doctor, pharmacist, or health care provider.  2023 Elsevier/Gold Standard (2004-06-19 00:00:00)

## 2022-01-31 NOTE — Progress Notes (Signed)
[name] was seen today for hip pain.  Diagnoses and all orders for this visit:  Chronic pain of both hips Continue meds and follow with pain management -     methocarbamol (ROBAXIN) 500 MG tablet; Take 1 tablet (500 mg total) by mouth 4 (four) times daily.  Back spasm Use heat/rest and muscle relaxant , if no improvement notify the office-     methocarbamol (ROBAXIN) 500 MG tablet; Take 1 tablet (500 mg total) by mouth 4 (four) times daily.  CKD stage 3 due to type 2 diabetes mellitus (HCC) Increase fluids, avoid NSAIDS, monitor sugars, will monitor       Further disposition pending results of labs. Discussed med's effects and SE's.   Over 30 minutes of exam, counseling, chart review, and critical decision making was performed.   Future Appointments  Date Time Provider [place]  02/01/2022  4:00 PM Carleene Mains, [place] [facility] None  04/05/2022 11:30 AM Darrol Jump, NP [facility] None  04/06/2022 10:30 AM Rankin, Clent Demark, MD [facility] None  09/12/2022 10:00 AM Darrol Jump, NP [facility] None  12/14/2022  2:00 PM [place], Kenney Houseman, NP [facility] None    ------------------------------------------------------------------------------------------------------------------   HPI BP 132/60   Pulse (!) 57   Temp 97.7 F (36.5 C)   Ht _0  (1.6 m)   Wt 109 lb 9.6 oz (49.7 kg)   SpO2 97%   BMI 19.41 kg/m   88 y.o.female presents for pain in her hips. She does have a history of chronic pain in both hips and is being followed by pain management. She had steroid injection in right hip for bursitis 01/06/22.  She is now complaining of left hip pain, hurts when she bends over. She also had a steroid injection in lumbar region 2 weeks ago.   She has pain in between left rib cage and hip with movement. She describes it as a sharp pain. She has a muscle spasm on palpation   BP Readings from Last 3 Encounters:  01/31/22 132/60  01/04/22 (!) 146/60  12/23/21 90/60   BMI is  Body mass index is 19.41 kg/m., she has been working on diet and exercise. Wt Readings from Last 3 Encounters:  01/31/22 109 lb 9.6 oz (49.7 kg)  01/04/22 111 lb (50.3 kg)  12/23/21 105 lb 4.8 oz (47.8 kg)     Past Medical History:  Diagnosis Date   Arthritis    Chronic back pain    Cystoid macular edema of right eye 08/19/2019   Diabetic neuropathy (HCC)    Exudative age-related macular degeneration of left eye with inactive choroidal neovascularization ([place]) 11/18/2019   Hypercholesteremia    Hypertension    Liver lesion    Neuropathy    Type II or unspecified type diabetes mellitus without mention of complication, not stated as uncontrolled    Vitamin D deficiency    Wears glasses      Allergies  Allergen Reactions   Morphine And Related Other (See Comments)    fuzzy feeling    Current Outpatient Medications on File Prior to Visit  Medication Sig   AquaLance Lancets 30G MISC CHECK BLOOD SUGAR 3 TIMES A DAY   aspirin 81 MG tablet Take 81 mg by mouth daily.   bisoprolol-hydrochlorothiazide (ZIAC) 2.5-6.25 MG tablet Take  1 tablet  Daily  for BP   Blood Glucose Calibration (ACCU-CHEK AVIVA) SOLN Check blood sugar 3 times daily-DX-E11.22   Blood Glucose Monitoring Suppl ([place] FLEX SYSTEM) w/Device KIT Check blood  sugar 3 times daily-E11.8   buPROPion (WELLBUTRIN XL) 150 MG 24 hr tablet Take 1 tablet every Morning for Mood , Focus & Concentration   busPIRone (BUSPAR) 5 MG tablet Take 1 tab three times a day as needed for mood/anxiety.   Cholecalciferol (VITAMIN D3) 2000 UNITS TABS Take 1 capsule by mouth 2 (two) times daily.   ciprofloxacin (CIPRO) 250 MG tablet Take  1 tablet   2 x /day  with Food for Urinary Tract  Infection   COMFORT EZ PEN NEEDLES 32G X 4 MM MISC USE 2 TIMES DAILY AS DIRECTED , 1 PEN NEEDLE PER INJECTION.   Continuous Blood Gluc Sensor (FREESTYLE LIBRE SENSOR SYSTEM) MISC Check blood sugar 3 times a day. Dx:E11.8, Z79.4   Continuous Blood Gluc  Sensor MISC Check blood sugar 3 times a day- Dx: E11.8/Z79.4   docusate sodium (COLACE) 100 MG capsule Take 1 capsule twice daily as needed for constipation with a bottle of water.   dorzolamide-timolol (COSOPT) 22.3-6.8 MG/ML ophthalmic solution Place 1 drop into both eyes 2 (two) times daily.   doxycycline (VIBRAMYCIN) 100 MG capsule Take 1 capsule 2 x /day with meals for Infection   Dulaglutide (TRULICITY) 1.5 SW/5.4OE SOPN INJECT 0.5ML (1.5MG) SUBCUTANEOUSLY ONCE WEEKLY (Patient not taking: Reported on 01/04/2022)   DULoxetine (CYMBALTA) 20 MG capsule Take 1 cap daily for mood and chronic pain.   EASY COMFORT INSULIN SYRINGE 30G X 1/2" 0.5 ML MISC    gabapentin (NEURONTIN) 100 MG capsule Take 1-2 caps 1 hour prior to bedtime as needed for pain and sleep.   glucose blood (ONETOUCH VERIO) test strip USE 1 STRIP TO CHECK GLUCOSE THREE TIMES DAILY AS DIRECTED   Insulin Glargine (BASAGLAR KWIKPEN) 100 UNIT/ML DIAL AND INJECT 30 UNITS UNDER THE SKIN ONCE DAILY AS DIRECTED   ipratropium (ATROVENT) 0.03 % nasal spray Place 2 sprays into the nose 3 (three) times daily.   Lancet Devices (ADJUSTABLE LANCING DEVICE) MISC CHECK BLOOD SUGAR 3 TIMES A DAY   latanoprost (XALATAN) 0.005 % ophthalmic solution Place 1 drop into both eyes at bedtime.   linaclotide (LINZESS) 72 MCG capsule Start taking 1 cap daily for constipation.   meclizine (ANTIVERT) 25 MG tablet Take 0.5-1 tablets (12.5-25 mg total) by mouth 3 (three) times daily as needed for dizziness.   methocarbamol (ROBAXIN) 500 MG tablet Take 500 mg by mouth 4 (four) times daily.   Multiple Vitamins-Minerals (CENTRUM SILVER 50+WOMEN) TABS Take 1 tablet by mouth daily.   Multiple Vitamins-Minerals (ICAPS AREDS 2 PO) Take 1 capsule by mouth daily.   rOPINIRole (REQUIP) 3 MG tablet Take 1 to 2 tablets  at Bedtime  for Restless Legs                                                               /                     TAKE  BY MOUTH   rosuvastatin (CRESTOR) 20  MG tablet Take  1 tablet  Daily for Cholesterol                                    /  TAKE                         BYMOUTH                   ONCEDAILY   traMADol (ULTRAM) 50 MG tablet Take 50 mg by mouth every 6 (six) hours as needed for moderate pain. Pt only takes 2 pills per night   vitamin B-12 (CYANOCOBALAMIN) 1000 MCG tablet Take 1,000 mcg by mouth daily.   No current facility-administered medications on file prior to visit.    ROS: all negative except above.   Physical Exam:  BP 132/60   Pulse (!) 57   Temp 97.7 F (36.5 C)   Ht _0  (1.6 m)   Wt 109 lb 9.6 oz (49.7 kg)   SpO2 97%   BMI 19.41 kg/m   General Appearance: Well nourished, in no apparent distress. Eyes: PERRLA, EOMs, conjunctiva no swelling or erythema Sinuses: No Frontal/maxillary tenderness ENT/Mouth: Ext aud canals clear, TMs without erythema, bulging. No erythema, swelling, or exudate on post pharynx.  Tonsils not swollen or erythematous. Hearing normal.  Neck: Supple, thyroid normal.  Respiratory: Respiratory effort normal, BS equal bilaterally without rales, rhonchi, wheezing or stridor.  Cardio: RRR with no MRGs. Brisk peripheral pulses without edema.  Abdomen: Soft, + BS.  Non tender, no guarding, rebound, hernias, masses. Lymphatics: Non tender without lymphadenopathy.  Musculoskeletal: Full ROM, 4/5 strength. Muscle sapsm of left latissimus Skin: Warm, dry without rashes, lesions, ecchymosis.  Neuro: Cranial nerves intact. Normal muscle tone, no cerebellar symptoms. Sensation intact.  Psych: Awake and oriented X 3, normal affect, Insight and Judgment appropriate.     Alycia Rossetti, NP 11:47 AM [name]

## 2022-02-01 ENCOUNTER — Ambulatory Visit: Payer: Medicare Other | Admitting: Pharmacy Technician

## 2022-02-01 NOTE — Telephone Encounter (Signed)
LM-02/01/22-Recvieved VM from patient this morning in regard to her appointment scheduled w/ CP. Called pt. And she asked when her appointment was scheduled. Informed pt. She had a visit scheduled w/ CP A[place] today at [place]. Pt. Stated she could not make it today d/t issues with her house. Offered to r/s visit and pt. Asked why she needed an appointment w/ CP and asked what the point was if it is just to go over her medications. Explained to pt. That CP Naida Sleight oversees PCP patients and not only goes over medications, but assists in care coordination of her chronic conditions and helps decrease the burden of her chronic conditions and preventing rehospitalization.  Pt. Verbalized understanding and asked if she could do the visit over the phone since it is a 20 minute drive for her to get to [place]. Informed pt. That we could change appointment to a phone call visit and explained to pt. That typically f/u visits are phone call visits and that they are typically brief. Pt. Verbalized understanding and agreed to r/s to 10/24 at 3:15PM. Informed pt. I will call a few days prior to her appointment to confirm. Pt. Stated she has problems with Gabapentin causing her to feel "out in space" and would like to discuss it at her visit. Informed pt. I will let CP know and informed her Gabapenitn can cause dizziness. And other undesired side effects. Pt. Verbalized understanding and will discuss at CP visit. CP aware visit was r/s. Updated schedule and patient panel.  Total time spent: 12 min.

## 2022-02-09 ENCOUNTER — Other Ambulatory Visit: Payer: Self-pay | Admitting: Nurse Practitioner

## 2022-02-09 DIAGNOSIS — R627 Adult failure to thrive: Secondary | ICD-10-CM

## 2022-02-09 NOTE — Progress Notes (Signed)
Referral placed to palliative care.

## 2022-02-11 ENCOUNTER — Telehealth: Payer: Self-pay

## 2022-02-11 NOTE — Telephone Encounter (Signed)
LM-02/11/22-Called pt. To r/s CP visit d/t provider being in a mandatory meeting on 10/24. Pt. Answered and was very upset. Pt. Stated "Listen let me tell you something, we just had a death in my family and I am not even going to talk to anybody this morning." Informed pt. I am very sorry and will be keeping her in my thoughts through this difficult time. Pt. Thanked me and said she will have to talk to me at another time next week.     Total time spent: 6 min.

## 2022-02-11 NOTE — Telephone Encounter (Signed)
[name] from [facility] [employer] called stating that the criteria for Palliative care has changed. They either have to have a diagnosis of cancer and seeing oncology or has to be within a few weeks of hospice care.

## 2022-02-14 NOTE — Telephone Encounter (Signed)
LM-02/14/22-Called pt. To r/s CP visit. Unable to reach pt. [place].  Total time spent: 2 min

## 2022-02-15 ENCOUNTER — Telehealth: Payer: Medicare Other | Admitting: Pharmacy Technician

## 2022-02-22 ENCOUNTER — Other Ambulatory Visit: Payer: Self-pay

## 2022-02-22 DIAGNOSIS — I10 Essential (primary) hypertension: Secondary | ICD-10-CM | POA: Diagnosis not present

## 2022-02-22 DIAGNOSIS — E1122 Type 2 diabetes mellitus with diabetic chronic kidney disease: Secondary | ICD-10-CM | POA: Diagnosis not present

## 2022-02-22 DIAGNOSIS — E1169 Type 2 diabetes mellitus with other specified complication: Secondary | ICD-10-CM | POA: Diagnosis not present

## 2022-02-22 DIAGNOSIS — E118 Type 2 diabetes mellitus with unspecified complications: Secondary | ICD-10-CM

## 2022-02-22 MED ORDER — BASAGLAR KWIKPEN 100 UNIT/ML ~~LOC~~ SOPN: PEN_INJECTOR | SUBCUTANEOUS | 3 refills | Status: DC

## 2022-02-22 NOTE — Telephone Encounter (Signed)
LM-02/22/22-Called pt. To r/s CP focused outreach. Unable to reach pt. [place] X2.  Total time spent: 2 min.

## 2022-02-23 ENCOUNTER — Telehealth: Payer: Self-pay

## 2022-02-23 NOTE — Telephone Encounter (Signed)
LM-02/23/22-Called pt. To assist with rescheduling her CP focused outreach call. Unable to reach pt. [place] X3.  Total time spent: 2 min.

## 2022-02-24 ENCOUNTER — Other Ambulatory Visit: Payer: Self-pay

## 2022-03-01 ENCOUNTER — Telehealth: Payer: Self-pay

## 2022-03-01 NOTE — Telephone Encounter (Signed)
LM-03/01/22--Created yearly renewal PAP form through [facility] for pts.[place].. Form updated and placed in pts. Documents. Called pt. To inform her to come into the office next Tuesday to sign her PAP form but was unable to reach pt. [place] X1. Noted pt. Is no longer taking Trulicity.  Total time spent: 29 min.

## 2022-03-02 ENCOUNTER — Telehealth: Payer: Self-pay

## 2022-03-02 ENCOUNTER — Encounter: Payer: Self-pay | Admitting: Nurse Practitioner

## 2022-03-02 ENCOUNTER — Ambulatory Visit (INDEPENDENT_AMBULATORY_CARE_PROVIDER_SITE_OTHER): Payer: Medicare Other | Admitting: Nurse Practitioner

## 2022-03-02 VITALS — BP 130/64 | HR 61 | Temp 98.0°F | Resp 16 | Ht 63.0 in | Wt 108.0 lb

## 2022-03-02 DIAGNOSIS — R5381 Other malaise: Secondary | ICD-10-CM | POA: Diagnosis not present

## 2022-03-02 DIAGNOSIS — Z79899 Other long term (current) drug therapy: Secondary | ICD-10-CM

## 2022-03-02 DIAGNOSIS — R531 Weakness: Secondary | ICD-10-CM | POA: Diagnosis not present

## 2022-03-02 NOTE — Progress Notes (Unsigned)
Assessment and Plan:  [name] was seen today for a follow up.  Diagnoses and all order for this visit:  1. Generalized weakness Discussed benefit from PT assessment and strength training  - Ambulatory referral to Physical Therapy  2. Physical deconditioning  - Ambulatory referral to Physical Therapy  3. Medication management Husband able to manage medication however, if becomes more cumbersome will refer to [facility] for review of disease and medication management.  Notify office for further evaluation and treatment, questions or concerns if s/s fail to improve. The risks and benefits of my recommendations, as well as other treatment options were discussed with the patient today. Questions were answered.  Further disposition pending results of labs. Discussed med's effects and SE's.    Over 20 minutes of exam, counseling, chart review, and critical decision making was performed.   Future Appointments  Date Time Provider [place]  04/06/2022 10:30 AM Rankin, Clent Demark, MD [facility] None  09/12/2022 10:00 AM Darrol Jump, NP [facility] None  12/14/2022  2:00 PM Laquana Villari, Kenney Houseman, NP [facility] None    ------------------------------------------------------------------------------------------------------------------   HPI BP 130/64   Pulse 61   Temp 98 F (36.7 C)   Resp 16   Ht _0  (1.6 m)   Wt 108 lb (49 kg)   SpO2 96%   BMI 19.13 kg/m   88 y.o.female presents alongside husband for general follow up.  Husband is concerned for wife's overall well being.  Patient feels as though she "just can't go anymore."  Patient states that she has a hard time getting around.  She feels this is due to her generalized pain, andd overall weakness.  Husband shares that he has to help her bathe and get dressed.  She still continues to "piddle" around the house but  husband says she "sleeps a lot" and at times does not get up until mid morning.  She feels as though she "doesn't have  much logner to live."  She can no longer enjoy the things she once did.  She is unable to get out and about and do things along side of her husband.  This also makes her sad and depressed.  She has also lost all of her family members/brother/sisters.  She does have three sons who come to visit and help.  She states they are busy with their own families.  She walks with a straight cane.  She has fallen multiple times.    Her husband manages most of her medications.  Continues to give her insulin and monitor her BG levels.  Husband cooks and tries to implement healthy, nutritionally dense meals.    Of note, she did follow with Neurosurgery 01/06/22  for right greater trochaner bursitis, lumbra apondylosis, s/p fusion l4-5 abd 5-s1.  She was given a steroid injection for her bursitis she is to be scheduled for a diagnotsitc [facility] bloc at l2-3 and l3-4 if she does well they will proceeed with 2nd diagnostic and move forward with radiofrequency ablation.  Tramadol was refilled, which may be contributing to her decline.  She plans to follow up for further injections.     Past Medical History:  Diagnosis Date   Arthritis    Chronic back pain    Cystoid macular edema of right eye 08/19/2019   Diabetic neuropathy (HCC)    Exudative age-related macular degeneration of left eye with inactive choroidal neovascularization ([place]) 11/18/2019   Hypercholesteremia    Hypertension    Liver lesion  Neuropathy    Type II or unspecified type diabetes mellitus without mention of complication, not stated as uncontrolled    Vitamin D deficiency    Wears glasses      Allergies  Allergen Reactions   Morphine And Related Other (See Comments)    fuzzy feeling    Current Outpatient Medications on File Prior to Visit  Medication Sig   AquaLance Lancets 30G MISC CHECK BLOOD SUGAR 3 TIMES A DAY   aspirin 81 MG tablet Take 81 mg by mouth daily.   bisoprolol-hydrochlorothiazide (ZIAC) 2.5-6.25 MG tablet Take   1 tablet  Daily  for BP   Blood Glucose Calibration (ACCU-CHEK AVIVA) SOLN Check blood sugar 3 times daily-DX-E11.22   Blood Glucose Monitoring Suppl (ONETOUCH VERIO FLEX SYSTEM) w/Device KIT Check blood sugar 3 times daily-E11.8   buPROPion (WELLBUTRIN XL) 150 MG 24 hr tablet Take 1 tablet every Morning for Mood , Focus & Concentration   busPIRone (BUSPAR) 5 MG tablet Take 1 tab three times a day as needed for mood/anxiety.   Cholecalciferol (VITAMIN D3) 2000 UNITS TABS Take 1 capsule by mouth 2 (two) times daily.   COMFORT EZ PEN NEEDLES 32G X 4 MM MISC USE 2 TIMES DAILY AS DIRECTED , 1 PEN NEEDLE PER INJECTION.   Continuous Blood Gluc Sensor (FREESTYLE LIBRE SENSOR SYSTEM) MISC Check blood sugar 3 times a day. Dx:E11.8, Z79.4   Continuous Blood Gluc Sensor MISC Check blood sugar 3 times a day- Dx: E11.8/Z79.4   docusate sodium (COLACE) 100 MG capsule Take 1 capsule twice daily as needed for constipation with a bottle of water.   dorzolamide-timolol (COSOPT) 22.3-6.8 MG/ML ophthalmic solution Place 1 drop into both eyes 2 (two) times daily.   doxycycline (VIBRAMYCIN) 100 MG capsule Take 1 capsule 2 x /day with meals for Infection   DULoxetine (CYMBALTA) 20 MG capsule Take 1 cap daily for mood and chronic pain.   EASY COMFORT INSULIN SYRINGE 30G X 1/2" 0.5 ML MISC    glucose blood (ONETOUCH VERIO) test strip USE 1 STRIP TO CHECK GLUCOSE THREE TIMES DAILY AS DIRECTED   Insulin Glargine (BASAGLAR KWIKPEN) 100 UNIT/ML DIAL AND INJECT 30 UNITS UNDER THE SKIN ONCE DAILY AS DIRECTED   ipratropium (ATROVENT) 0.03 % nasal spray Place 2 sprays into the nose 3 (three) times daily.   Lancet Devices (ADJUSTABLE LANCING DEVICE) MISC CHECK BLOOD SUGAR 3 TIMES A DAY   latanoprost (XALATAN) 0.005 % ophthalmic solution Place 1 drop into both eyes at bedtime.   linaclotide (LINZESS) 72 MCG capsule Start taking 1 cap daily for constipation.   meclizine (ANTIVERT) 25 MG tablet Take 0.5-1 tablets (12.5-25 mg total)  by mouth 3 (three) times daily as needed for dizziness.   MELATONIN PO Take by mouth.   methocarbamol (ROBAXIN) 500 MG tablet Take 1 tablet (500 mg total) by mouth 4 (four) times daily.   Multiple Vitamins-Minerals (CENTRUM SILVER 50+WOMEN) TABS Take 1 tablet by mouth daily.   Multiple Vitamins-Minerals (ICAPS AREDS 2 PO) Take 1 capsule by mouth daily.   rOPINIRole (REQUIP) 3 MG tablet Take 1 to 2 tablets  at Bedtime  for Restless Legs                                                               /  TAKE  BY MOUTH   rosuvastatin (CRESTOR) 20 MG tablet Take  1 tablet  Daily for Cholesterol                                    /                              TAKE                         BYMOUTH                   ONCEDAILY   traMADol (ULTRAM) 50 MG tablet Take 50 mg by mouth every 6 (six) hours as needed for moderate pain. Pt only takes 2 pills per night   vitamin B-12 (CYANOCOBALAMIN) 1000 MCG tablet Take 1,000 mcg by mouth daily.   No current facility-administered medications on file prior to visit.    ROS: all negative except what is noted in the HPI.   Physical Exam:  BP 130/64   Pulse 61   Temp 98 F (36.7 C)   Resp 16   Ht _0  (1.6 m)   Wt 108 lb (49 kg)   SpO2 96%   BMI 19.13 kg/m   General Appearance: NAD.  Awake, conversant and cooperative. Eyes: PERRLA, EOMs intact.  Sclera white.  Conjunctiva without erythema. Sinuses: No frontal/maxillary tenderness.  No nasal discharge. Nares patent.  ENT/Mouth: Ext aud canals clear.  Bilateral TMs w/DOL and without erythema or bulging. Hearing intact.  Posterior pharynx without swelling or exudate.  Tonsils without swelling or erythema.  Neck: Supple.  No masses, nodules or thyromegaly. Respiratory: Effort is regular with non-labored breathing. Breath sounds are equal bilaterally without rales, rhonchi, wheezing or stridor.  Cardio: RRR with no MRGs. Brisk peripheral pulses without edema.  Abdomen: Active BS in all four  quadrants.  Soft and non-tender without guarding, rebound tenderness, hernias or masses. Lymphatics: Non tender without lymphadenopathy.  Musculoskeletal: Full ROM, 5/5 strength, normal ambulation.  No clubbing or cyanosis. Skin: Appropriate color for ethnicity. Warm without rashes, lesions, ecchymosis, ulcers.  Neuro: CN II-XII grossly normal. Normal muscle tone without cerebellar symptoms and intact sensation.   Psych: AO X 3,  appropriate mood and affect, insight and judgment.     Darrol Jump, NP 3:57 PM [facility]

## 2022-03-02 NOTE — Telephone Encounter (Signed)
LM-03/02/22-Called pt. To  ask if she could come into the office next Tuesday to sign PAP forms for her [place]. Unable to reach pt. [place] X2.  Total time spent: 2 min.

## 2022-03-08 ENCOUNTER — Ambulatory Visit (INDEPENDENT_AMBULATORY_CARE_PROVIDER_SITE_OTHER): Payer: Medicare Other

## 2022-03-08 VITALS — Temp 98.0°F

## 2022-03-08 DIAGNOSIS — Z23 Encounter for immunization: Secondary | ICD-10-CM | POA: Diagnosis not present

## 2022-03-08 NOTE — Patient Instructions (Signed)
Deconditioning Deconditioning refers to the changes in the body that occur during a period of time when you are not active (inactivity). The changes happen in the heart, lungs, and muscles. They make you feel tired and weak (fatigued) and decrease your ability to be active. The three stages of deconditioning include: Mild deconditioning. This is a change in your ability to do your usual exercise activities, such as running, biking, or swimming. Moderate deconditioning. This is a change in your ability to do normal everyday activities, such as walking, shopping for groceries, and doing chores. Severe deconditioning. In this stage, you may not be able to do minimal activity or usual self-care. What are the causes? Deconditioning can occur after only a few days of inactivity. The longer the period of inactivity, the more severe the deconditioning will be, and the longer it will take to return to your previous level of functioning. It can take three times longer to recover than the time period of inactivity. Deconditioning is caused by inactivity, often due to: Illnesses, such as cancer, stroke, heart attack, fibromyalgia, and chronic fatigue syndrome. Injuries, especially back injuries, broken bones, and injuries to soft tissues, such as ligaments and tendons. Hospitalization, even for just a day or two. Pregnancy, especially if long periods of bed rest are needed. What increases the risk? The following factors may make you more likely to develop this condition: Having obesity or poor nutrition. Having poor mobility before the period of inactivity. Being an older adult. Having depression. Having problems with thinking and learning (cognitive impairment). What are the signs or symptoms? Symptoms of this condition include: Weakness and tiredness. Shortness of breath with minor physical effort (exertion). A heartbeat that is faster than normal. You may not notice this without taking your pulse. Pain  or discomfort with activity. Decreased strength, endurance, and balance. Difficulty doing your usual forms of exercise. Difficulty doing activities of daily living, such as grocery shopping or chores. You may also have problems walking around the house and doing basic self-care, such as getting to the bathroom, preparing meals, or doing laundry. How is this diagnosed? This condition is diagnosed based on your medical history and a physical exam. During the physical exam, your health care provider will check for signs of deconditioning, such as: Decreased size of muscles. Decreased strength. Trouble with balance. Shortness of breath or a heart rate that is faster than normal after minor exertion. How is this treated? Treatment for this condition involves an exercise program in which activity is increased slowly. Your health care provider will tell you which exercises are right for you. The exercise program will likely include: Aerobic exercise. This type of exercise helps improve the functioning of the heart, lungs, and muscles. Strength training. This type of exercise helps increase muscle size and strength. Both of these types of exercise will improve your endurance. You may be referred to a physical therapist who can create a safe strengthening program for you to follow. Follow these instructions at home: Eating and drinking  Eat a healthy, well-balanced diet. This includes: Proteins, such as lean meats and fish, to build muscles. Fresh fruits and vegetables. Carbohydrates, such as whole grains, to boost energy. Drink enough fluid to keep your urine pale yellow. Activity  Follow the exercise program that is recommended by your health care provider or physical therapist. Do not increase your exercise any faster than directed. General instructions Take over-the-counter and prescription medicines only as told by your health care provider. Do not use any   products that contain nicotine or  tobacco. These products include cigarettes, chewing tobacco, and vaping devices, such as e-cigarettes. If you need help quitting, ask your health care provider. Keep all follow-up visits. This is important. Contact a health care provider if: You are not able to do the recommended exercise program. You are becoming more and more tired and weak. You become light-headed when rising to a sitting or standing position. Your level of endurance decreases after it has improved. Get help right away if: You have chest pain. You are very short of breath. You have any episodes of fainting. These symptoms may be an emergency. Get help right away. Call 911. Do not wait to see if the symptoms will go away. Do not drive yourself to the hospital. Summary Deconditioning refers to the changes in the body that occur during a period of inactivity. Deconditioning happens in the heart, lungs, and muscles. The changes make you feel tired and weak and decrease your ability to be active. Treatment for deconditioning involves an exercise program in which activity is increased slowly. This information is not intended to replace advice given to you by your health care provider. Make sure you discuss any questions you have with your health care provider. Document Revised: 02/01/2021 Document Reviewed: 02/01/2021 Elsevier Patient Education  2023 [employer].  

## 2022-03-15 NOTE — Telephone Encounter (Signed)
LM-03/15/22-Called pt. To inform her that she needs to come into the office to sign her PAP for her Basaglar and to bring income documentation. Unable to reach pt. Rives.  Total time spent: 2 min.

## 2022-03-24 ENCOUNTER — Other Ambulatory Visit: Payer: Self-pay

## 2022-03-24 DIAGNOSIS — I10 Essential (primary) hypertension: Secondary | ICD-10-CM | POA: Diagnosis not present

## 2022-03-24 DIAGNOSIS — E1122 Type 2 diabetes mellitus with diabetic chronic kidney disease: Secondary | ICD-10-CM | POA: Diagnosis not present

## 2022-03-24 DIAGNOSIS — E118 Type 2 diabetes mellitus with unspecified complications: Secondary | ICD-10-CM | POA: Diagnosis not present

## 2022-03-24 DIAGNOSIS — E1169 Type 2 diabetes mellitus with other specified complication: Secondary | ICD-10-CM | POA: Diagnosis not present

## 2022-03-24 MED ORDER — ONETOUCH VERIO VI STRP: ORAL_STRIP | 3 refills | Status: DC

## 2022-03-30 ENCOUNTER — Encounter: Payer: Self-pay | Admitting: Internal Medicine

## 2022-04-05 ENCOUNTER — Ambulatory Visit: Payer: Medicare Other | Admitting: Nurse Practitioner

## 2022-04-06 ENCOUNTER — Telehealth: Payer: Self-pay | Admitting: Nurse Practitioner

## 2022-04-06 ENCOUNTER — Encounter (INDEPENDENT_AMBULATORY_CARE_PROVIDER_SITE_OTHER): Payer: Medicare Other | Admitting: Ophthalmology

## 2022-04-06 DIAGNOSIS — E118 Type 2 diabetes mellitus with unspecified complications: Secondary | ICD-10-CM

## 2022-04-06 MED ORDER — BLOOD GLUCOSE MONITORING SUPPL DEVI: 0 refills | Status: DC

## 2022-04-06 MED ORDER — ONETOUCH VERIO VI STRP: ORAL_STRIP | 3 refills | Status: DC

## 2022-04-06 MED ORDER — LANCETS MISC: 11 refills | Status: DC

## 2022-04-06 NOTE — Addendum Note (Signed)
Addended by: Chancy Hurter on: 04/06/2022 11:57 AM   Modules accepted: Orders

## 2022-04-06 NOTE — Telephone Encounter (Signed)
Needing a new blood sugar meter, hers is not working anymore. Pharmacy does not do the blood sugar kit with the needles and strips. Please order the needles, strips separate to [facility] on [address]

## 2022-05-10 ENCOUNTER — Encounter: Payer: Self-pay | Admitting: Nurse Practitioner

## 2022-05-10 ENCOUNTER — Ambulatory Visit (INDEPENDENT_AMBULATORY_CARE_PROVIDER_SITE_OTHER): Payer: Medicare Other | Admitting: Nurse Practitioner

## 2022-05-10 VITALS — BP 148/66 | HR 60 | Temp 97.9°F | Ht 63.0 in | Wt 114.0 lb

## 2022-05-10 DIAGNOSIS — R350 Frequency of micturition: Secondary | ICD-10-CM

## 2022-05-10 DIAGNOSIS — R3915 Urgency of urination: Secondary | ICD-10-CM

## 2022-05-10 DIAGNOSIS — R300 Dysuria: Secondary | ICD-10-CM

## 2022-05-10 DIAGNOSIS — R8290 Unspecified abnormal findings in urine: Secondary | ICD-10-CM

## 2022-05-10 MED ORDER — NITROFURANTOIN MONOHYD MACRO 100 MG PO CAPS: 100.0000 mg | ORAL_CAPSULE | Freq: Two times a day (BID) | ORAL | 0 refills | Status: DC

## 2022-05-10 NOTE — Progress Notes (Signed)
Assessment and Plan:  [name] was seen today for an episodic visit.  Diagnoses and all order for this visit:  Dysuria Stay well hydrated to keep urinary system well flushed Consider daily cranberry juice or oral supplement Monitor for any increase in fever, chills, N/V, abdominal pain.   Contact office or report to ER for further evaluation if s/s fail to improve or any sign of worsening infection as noted above.  - Urinalysis, Routine w reflex microscopic - Urine Culture - nitrofurantoin, macrocrystal-monohydrate, (MACROBID) 100 MG capsule; Take 1 capsule (100 mg total) by mouth 2 (two) times daily for 5 days.  Dispense: 10 capsule; Refill: 0  Urinary frequency/urgency/malodor May wear incontinence pad PRN.   Notify office for further evaluation and treatment, questions or concerns if any reported s/s fail to improve.   The patient was advised to call back or seek an in-person evaluation if any symptoms worsen or if the condition fails to improve as anticipated.   Further disposition pending results of labs. Discussed med's effects and SE's.    I discussed the assessment and treatment plan with the patient. The patient was provided an opportunity to ask questions and all were answered. The patient agreed with the plan and demonstrated an understanding of the instructions.  Discussed med's effects and SE's. Screening labs and tests as requested with regular follow-up as recommended.  I provided 15 minutes of face-to-face time during this encounter including counseling, chart review, and critical decision making was preformed.  Future Appointments  Date Time Provider [place]  09/12/2022 10:00 AM Darrol Jump, NP [facility] None  12/14/2022  2:00 PM Natayah Warmack, Kenney Houseman, NP [facility] None    ------------------------------------------------------------------------------------------------------------------   HPI BP (!) 148/66   Pulse 60   Temp 97.9 F (36.6 C)    Ht '5\' 3"'$  (1.6 m)   Wt 114 lb (51.7 kg)   SpO2 99%   BMI 20.19 kg/m    Patient complains of abnormal smelling urine, dysuria, frequency, and urgency. She has had symptoms for 2 days. Patient also complains of  fatigue . Patient denies back pain, fever, headache, stomach ache, and vaginal discharge. Patient does not have a history of recurrent UTI. Patient does not have a history of pyelonephritis.   Past Medical History:  Diagnosis Date   Arthritis    Chronic back pain    Cystoid macular edema of right eye 08/19/2019   Diabetic neuropathy (HCC)    Exudative age-related macular degeneration of left eye with inactive choroidal neovascularization ([place]) 11/18/2019   Hypercholesteremia    Hypertension    Liver lesion    Neuropathy    Type II or unspecified type diabetes mellitus without mention of complication, not stated as uncontrolled    Vitamin D deficiency    Wears glasses      Allergies  Allergen Reactions   Morphine And Related Other (See Comments)    fuzzy feeling    Current Outpatient Medications on File Prior to Visit  Medication Sig   aspirin 81 MG tablet Take 81 mg by mouth daily.   bisoprolol-hydrochlorothiazide (ZIAC) 2.5-6.25 MG tablet Take  1 tablet  Daily  for BP   Blood Glucose Calibration (ACCU-CHEK AVIVA) SOLN Check blood sugar 3 times daily-DX-E11.22   Blood Glucose Monitoring Suppl DEVI Use to test blood sugar three times a day   buPROPion (WELLBUTRIN XL) 150 MG 24 hr tablet Take 1 tablet every Morning for Mood , Focus & Concentration   busPIRone (BUSPAR) 5 MG tablet  Take 1 tab three times a day as needed for mood/anxiety.   Cholecalciferol (VITAMIN D3) 2000 UNITS TABS Take 1 capsule by mouth 2 (two) times daily.   COMFORT EZ PEN NEEDLES 32G X 4 MM MISC USE 2 TIMES DAILY AS DIRECTED , 1 PEN NEEDLE PER INJECTION.   Continuous Blood Gluc Sensor (FREESTYLE LIBRE SENSOR SYSTEM) MISC Check blood sugar 3 times a day. Dx:E11.8, Z79.4   Continuous Blood Gluc Sensor  MISC Check blood sugar 3 times a day- Dx: E11.8/Z79.4   docusate sodium (COLACE) 100 MG capsule Take 1 capsule twice daily as needed for constipation with a bottle of water.   dorzolamide-timolol (COSOPT) 22.3-6.8 MG/ML ophthalmic solution Place 1 drop into both eyes 2 (two) times daily.   doxycycline (VIBRAMYCIN) 100 MG capsule Take 1 capsule 2 x /day with meals for Infection   DULoxetine (CYMBALTA) 20 MG capsule Take 1 cap daily for mood and chronic pain.   EASY COMFORT INSULIN SYRINGE 30G X 1/2" 0.5 ML MISC    glucose blood (ONETOUCH VERIO) test strip USE 1 STRIP TO CHECK GLUCOSE THREE TIMES DAILY AS DIRECTED   Insulin Glargine (BASAGLAR KWIKPEN) 100 UNIT/ML DIAL AND INJECT 30 UNITS UNDER THE SKIN ONCE DAILY AS DIRECTED   ipratropium (ATROVENT) 0.03 % nasal spray Place 2 sprays into the nose 3 (three) times daily.   Lancet Devices (ADJUSTABLE LANCING DEVICE) MISC CHECK BLOOD SUGAR 3 TIMES A DAY   Lancets MISC Use to test blood sugar three times a day   latanoprost (XALATAN) 0.005 % ophthalmic solution Place 1 drop into both eyes at bedtime.   linaclotide (LINZESS) 72 MCG capsule Start taking 1 cap daily for constipation.   meclizine (ANTIVERT) 25 MG tablet Take 0.5-1 tablets (12.5-25 mg total) by mouth 3 (three) times daily as needed for dizziness.   MELATONIN PO Take by mouth.   methocarbamol (ROBAXIN) 500 MG tablet Take 1 tablet (500 mg total) by mouth 4 (four) times daily.   Multiple Vitamins-Minerals (CENTRUM SILVER 50+WOMEN) TABS Take 1 tablet by mouth daily.   Multiple Vitamins-Minerals (ICAPS AREDS 2 PO) Take 1 capsule by mouth daily.   rOPINIRole (REQUIP) 3 MG tablet Take 1 to 2 tablets  at Bedtime  for Restless Legs                                                               /                     TAKE  BY MOUTH   rosuvastatin (CRESTOR) 20 MG tablet Take  1 tablet  Daily for Cholesterol                                    /                              TAKE                          BYMOUTH                   ONCEDAILY  traMADol (ULTRAM) 50 MG tablet Take 50 mg by mouth every 6 (six) hours as needed for moderate pain. Pt only takes 2 pills per night   vitamin B-12 (CYANOCOBALAMIN) 1000 MCG tablet Take 1,000 mcg by mouth daily.   No current facility-administered medications on file prior to visit.    ROS: all negative except what is noted in the HPI.   Physical Exam:  BP (!) 148/66   Pulse 60   Temp 97.9 F (36.6 C)   Ht '5\' 3"'$  (1.6 m)   Wt 114 lb (51.7 kg)   SpO2 99%   BMI 20.19 kg/m   General Appearance: NAD.  Awake, conversant and cooperative. Eyes: PERRLA, EOMs intact.  Sclera white.  Conjunctiva without erythema. Sinuses: No frontal/maxillary tenderness.  No nasal discharge. Nares patent.  ENT/Mouth: Ext aud canals clear.  Bilateral TMs w/DOL and without erythema or bulging. Hearing intact.  Posterior pharynx without swelling or exudate.  Tonsils without swelling or erythema.  Neck: Supple.  No masses, nodules or thyromegaly. Respiratory: Effort is regular with non-labored breathing. Breath sounds are equal bilaterally without rales, rhonchi, wheezing or stridor.  Cardio: RRR with no MRGs. Brisk peripheral pulses without edema.  Abdomen: Active BS in all four quadrants.  Soft and non-tender without guarding, rebound tenderness, hernias or masses. Lymphatics: Non tender without lymphadenopathy.  Musculoskeletal: Full ROM, 5/5 strength, normal ambulation.  No clubbing or cyanosis. Skin: Appropriate color for ethnicity. Warm without rashes, lesions, ecchymosis, ulcers.  Neuro: CN II-XII grossly normal. Normal muscle tone without cerebellar symptoms and intact sensation.   Psych: AO X 3,  appropriate mood and affect, insight and judgment.     Darrol Jump, NP 1:57 PM [facility]

## 2022-05-10 NOTE — Patient Instructions (Signed)
Urinary Tract Infection, Adult ?A urinary tract infection (UTI) is an infection of any part of the urinary tract. The urinary tract includes: ?The kidneys. ?The ureters. ?The bladder. ?The urethra. ?These organs make, store, and get rid of pee (urine) in the body. ?What are the causes? ?This infection is caused by germs (bacteria) in your genital area. These germs grow and cause swelling (inflammation) of your urinary tract. ?What increases the risk? ?The following factors may make you more likely to develop this condition: ?Using a small, thin tube (catheter) to drain pee. ?Not being able to control when you pee or poop (incontinence). ?Being female. If you are female, these things can increase the risk: ?Using these methods to prevent pregnancy: ?A medicine that kills sperm (spermicide). ?A device that blocks sperm (diaphragm). ?Having low levels of a female hormone (estrogen). ?Being pregnant. ?You are more likely to develop this condition if: ?You have genes that add to your risk. ?You are sexually active. ?You take antibiotic medicines. ?You have trouble peeing because of: ?A prostate that is bigger than normal, if you are female. ?A blockage in the part of your body that drains pee from the bladder. ?A kidney stone. ?A nerve condition that affects your bladder. ?Not getting enough to drink. ?Not peeing often enough. ?You have other conditions, such as: ?Diabetes. ?A weak disease-fighting system (immune system). ?Sickle cell disease. ?Gout. ?Injury of the spine. ?What are the signs or symptoms? ?Symptoms of this condition include: ?Needing to pee right away. ?Peeing small amounts often. ?Pain or burning when peeing. ?Blood in the pee. ?Pee that smells bad or not like normal. ?Trouble peeing. ?Pee that is cloudy. ?Fluid coming from the vagina, if you are female. ?Pain in the belly or lower back. ?Other symptoms include: ?Vomiting. ?Not feeling hungry. ?Feeling mixed up (confused). This may be the first symptom in  older adults. ?Being tired and grouchy (irritable). ?A fever. ?Watery poop (diarrhea). ?How is this treated? ?Taking antibiotic medicine. ?Taking other medicines. ?Drinking enough water. ?In some cases, you may need to see a specialist. ?Follow these instructions at home: ? ?Medicines ?Take over-the-counter and prescription medicines only as told by your doctor. ?If you were prescribed an antibiotic medicine, take it as told by your doctor. Do not stop taking it even if you start to feel better. ?General instructions ?Make sure you: ?Pee until your bladder is empty. ?Do not hold pee for a long time. ?Empty your bladder after sex. ?Wipe from front to back after peeing or pooping if you are a female. Use each tissue one time when you wipe. ?Drink enough fluid to keep your pee pale yellow. ?Keep all follow-up visits. ?Contact a doctor if: ?You do not get better after 1-2 days. ?Your symptoms go away and then come back. ?Get help right away if: ?You have very bad back pain. ?You have very bad pain in your lower belly. ?You have a fever. ?You have chills. ?You feeling like you will vomit or you vomit. ?Summary ?A urinary tract infection (UTI) is an infection of any part of the urinary tract. ?This condition is caused by germs in your genital area. ?There are many risk factors for a UTI. ?Treatment includes antibiotic medicines. ?Drink enough fluid to keep your pee pale yellow. ?This information is not intended to replace advice given to you by your health care provider. Make sure you discuss any questions you have with your health care provider. ?Document Revised: 11/22/2019 Document Reviewed: 11/22/2019 ?Elsevier Patient Education ?   [place].

## 2022-05-12 ENCOUNTER — Other Ambulatory Visit: Payer: Self-pay | Admitting: Nurse Practitioner

## 2022-05-12 LAB — URINALYSIS, ROUTINE W REFLEX MICROSCOPIC
Bilirubin Urine: NEGATIVE
Glucose, UA: NEGATIVE
Ketones, ur: NEGATIVE
Nitrite: POSITIVE — AB
Specific Gravity, Urine: 1.016 (ref 1.001–1.035)
pH: 5.5 (ref 5.0–8.0)

## 2022-05-12 LAB — URINE CULTURE
MICRO NUMBER:: 14435374
SPECIMEN QUALITY:: ADEQUATE

## 2022-05-12 LAB — MICROSCOPIC MESSAGE

## 2022-05-12 MED ORDER — CIPROFLOXACIN HCL 250 MG PO TABS: ORAL_TABLET | ORAL | 0 refills | Status: DC

## 2022-05-23 ENCOUNTER — Telehealth: Payer: Self-pay | Admitting: Nurse Practitioner

## 2022-05-23 DIAGNOSIS — E118 Type 2 diabetes mellitus with unspecified complications: Secondary | ICD-10-CM

## 2022-05-23 MED ORDER — ONETOUCH VERIO VI STRP: ORAL_STRIP | 3 refills | Status: DC

## 2022-05-23 NOTE — Addendum Note (Signed)
Addended by: Chancy Hurter on: 05/23/2022 12:01 PM   Modules accepted: Orders

## 2022-05-23 NOTE — Telephone Encounter (Signed)
Pt is requesting a refill on OneTouch test strips to go to [facility] on [address]

## 2022-05-24 ENCOUNTER — Telehealth: Payer: Self-pay | Admitting: Nurse Practitioner

## 2022-05-24 NOTE — Telephone Encounter (Signed)
Pt's son is requesting a referral to home health for maybe 1-2x a week for someone to come to the home to assist them. Wanting a call back as to how they can get that set up for her and Mr. Cammack

## 2022-05-25 NOTE — Telephone Encounter (Signed)
[name] son aware and appointments scheduled.

## 2022-06-01 ENCOUNTER — Ambulatory Visit: Payer: Medicare Other | Admitting: Nurse Practitioner

## 2022-07-11 DIAGNOSIS — M47816 Spondylosis without myelopathy or radiculopathy, lumbar region: Secondary | ICD-10-CM | POA: Diagnosis not present

## 2022-08-04 ENCOUNTER — Encounter: Payer: Self-pay | Admitting: Nurse Practitioner

## 2022-08-04 ENCOUNTER — Ambulatory Visit (INDEPENDENT_AMBULATORY_CARE_PROVIDER_SITE_OTHER): Payer: Medicare Other | Admitting: Nurse Practitioner

## 2022-08-04 VITALS — BP 126/58 | HR 51 | Temp 97.9°F | Wt 110.2 lb

## 2022-08-04 DIAGNOSIS — Z681 Body mass index (BMI) 19 or less, adult: Secondary | ICD-10-CM | POA: Diagnosis not present

## 2022-08-04 DIAGNOSIS — E118 Type 2 diabetes mellitus with unspecified complications: Secondary | ICD-10-CM | POA: Diagnosis not present

## 2022-08-04 DIAGNOSIS — Z79899 Other long term (current) drug therapy: Secondary | ICD-10-CM | POA: Diagnosis not present

## 2022-08-04 DIAGNOSIS — Z794 Long term (current) use of insulin: Secondary | ICD-10-CM

## 2022-08-09 ENCOUNTER — Telehealth: Payer: Self-pay | Admitting: Nurse Practitioner

## 2022-08-09 NOTE — Telephone Encounter (Signed)
Spoke with [name] and told him, [name] can take the cinnamon as directed on the bottle.  Patient understands.

## 2022-08-09 NOTE — Telephone Encounter (Signed)
Pt has questions about the instructions Archie Patten gave her as to how to take Cinammon, I tired to look under her encounter but there isnt a note. Can she get a call back in regards to this please

## 2022-08-14 NOTE — Progress Notes (Signed)
Assessment and Plan:  [name] was seen today for an episodic visit.  Diagnoses and all order for this visit:  Type 2 diabetes mellitus with complication, with long-term current use of insulin Start [place] Cinnamon as directed Discussed not using insulin when BG levels are <130. Discussed how weigh loss can affect BG levels.   Continue to monitor A1c - currently at 7.0% Monitor diet  BMI 19.9 or less Discussed appropriate BMI Diet modification. Physical activity. Encouraged/praised to build confidence.  Medication management All medications discussed and reviewed in full. All questions and concerns regarding medications addressed.    Notify office for further evaluation and treatment, questions or concerns if s/s fail to improve. The risks and benefits of my recommendations, as well as other treatment options were discussed with the patient today. Questions were answered.  Further disposition pending results of labs. Discussed med's effects and SE's.    Over 20 minutes of exam, counseling, chart review, and critical decision making was performed.   Future Appointments  Date Time Provider Department Center  09/12/2022 10:00 AM Adela Glimpse, NP [facility] None  12/14/2022  2:00 PM Mckay Tegtmeyer, Archie Patten, NP [facility] None    ------------------------------------------------------------------------------------------------------------------   HPI BP (!) 126/58   Pulse (!) 51   Temp 97.9 F (36.6 C)   Wt 110 lb 3.2 oz (50 kg)   SpO2 99%   BMI 19.52 kg/m   89 y.o.female presents for evaluation of medication management for T2DM.  She is currently prescribed Basaglar Insulin and reports injection 30 U every morning if BG if >130 however this is causing hypoglycemic episodes.  Her weight has also been fluctuating.  She presents with her husband who shares that she "does not eat."  A1c is currently well controlled at 7.0%.  BMI is Body mass index is 19.52 kg/m., she has not  been working on diet and exercise. Wt Readings from Last 3 Encounters:  08/04/22 110 lb 3.2 oz (50 kg)  05/10/22 114 lb (51.7 kg)  03/02/22 108 lb (49 kg)   Lab Results  Component Value Date   HGBA1C 7.0 (H) 12/07/2021   Past Medical History:  Diagnosis Date   Arthritis    Chronic back pain    Cystoid macular edema of right eye 08/19/2019   Diabetic neuropathy    Exudative age-related macular degeneration of left eye with inactive choroidal neovascularization 11/18/2019   Hypercholesteremia    Hypertension    Liver lesion    Neuropathy    Type II or unspecified type diabetes mellitus without mention of complication, not stated as uncontrolled    Vitamin D deficiency    Wears glasses      Allergies  Allergen Reactions   Morphine And Related Other (See Comments)    fuzzy feeling    Current Outpatient Medications on File Prior to Visit  Medication Sig   aspirin 81 MG tablet Take 81 mg by mouth daily.   bisoprolol-hydrochlorothiazide (ZIAC) 2.5-6.25 MG tablet Take  1 tablet  Daily  for BP   Blood Glucose Calibration (ACCU-CHEK AVIVA) SOLN Check blood sugar 3 times daily-DX-E11.22   Blood Glucose Monitoring Suppl DEVI Use to test blood sugar three times a day   buPROPion (WELLBUTRIN XL) 150 MG 24 hr tablet Take 1 tablet every Morning for Mood , Focus & Concentration   busPIRone (BUSPAR) 5 MG tablet Take 1 tab three times a day as needed for mood/anxiety.   Cholecalciferol (VITAMIN D3) 2000 UNITS TABS Take 1 capsule by mouth  2 (two) times daily.   ciprofloxacin (CIPRO) 250 MG tablet Take 1 tablet 2 x /day with Food for Infection   COMFORT EZ PEN NEEDLES 32G X 4 MM MISC USE 2 TIMES DAILY AS DIRECTED , 1 PEN NEEDLE PER INJECTION.   Continuous Blood Gluc Sensor (FREESTYLE LIBRE SENSOR SYSTEM) MISC Check blood sugar 3 times a day. Dx:E11.8, Z79.4   Continuous Blood Gluc Sensor MISC Check blood sugar 3 times a day- Dx: E11.8/Z79.4   docusate sodium (COLACE) 100 MG capsule Take 1  capsule twice daily as needed for constipation with a bottle of water.   dorzolamide-timolol (COSOPT) 22.3-6.8 MG/ML ophthalmic solution Place 1 drop into both eyes 2 (two) times daily.   DULoxetine (CYMBALTA) 20 MG capsule Take 1 cap daily for mood and chronic pain.   EASY COMFORT INSULIN SYRINGE 30G X 1/2" 0.5 ML MISC    glucose blood (ONETOUCH VERIO) test strip USE 1 STRIP TO CHECK GLUCOSE THREE TIMES DAILY AS DIRECTED   Insulin Glargine (BASAGLAR KWIKPEN) 100 UNIT/ML DIAL AND INJECT 30 UNITS UNDER THE SKIN ONCE DAILY AS DIRECTED   ipratropium (ATROVENT) 0.03 % nasal spray Place 2 sprays into the nose 3 (three) times daily.   Lancet Devices (ADJUSTABLE LANCING DEVICE) MISC CHECK BLOOD SUGAR 3 TIMES A DAY   Lancets MISC Use to test blood sugar three times a day   latanoprost (XALATAN) 0.005 % ophthalmic solution Place 1 drop into both eyes at bedtime.   linaclotide (LINZESS) 72 MCG capsule Start taking 1 cap daily for constipation.   meclizine (ANTIVERT) 25 MG tablet Take 0.5-1 tablets (12.5-25 mg total) by mouth 3 (three) times daily as needed for dizziness.   MELATONIN PO Take by mouth.   methocarbamol (ROBAXIN) 500 MG tablet Take 1 tablet (500 mg total) by mouth 4 (four) times daily.   Multiple Vitamins-Minerals (CENTRUM SILVER 50+WOMEN) TABS Take 1 tablet by mouth daily.   Multiple Vitamins-Minerals (ICAPS AREDS 2 PO) Take 1 capsule by mouth daily.   rOPINIRole (REQUIP) 3 MG tablet Take 1 to 2 tablets  at Bedtime  for Restless Legs                                                               /                     TAKE  BY MOUTH   rosuvastatin (CRESTOR) 20 MG tablet Take  1 tablet  Daily for Cholesterol                                    /                              TAKE                         BYMOUTH                   ONCEDAILY   traMADol (ULTRAM) 50 MG tablet Take 50 mg by mouth every 6 (six) hours as needed for moderate pain. Pt only takes 2 pills per  night   vitamin B-12  (CYANOCOBALAMIN) 1000 MCG tablet Take 1,000 mcg by mouth daily.   No current facility-administered medications on file prior to visit.    ROS: all negative except what is noted in the HPI.   Physical Exam:  BP (!) 126/58   Pulse (!) 51   Temp 97.9 F (36.6 C)   Wt 110 lb 3.2 oz (50 kg)   SpO2 99%   BMI 19.52 kg/m   General Appearance: NAD.  Awake, conversant and cooperative. Eyes: PERRLA, EOMs intact.  Sclera white.  Conjunctiva without erythema. Sinuses: No frontal/maxillary tenderness.  No nasal discharge. Nares patent.  ENT/Mouth: Ext aud canals clear.  Bilateral TMs w/DOL and without erythema or bulging. Hearing intact.  Posterior pharynx without swelling or exudate.  Tonsils without swelling or erythema.  Neck: Supple.  No masses, nodules or thyromegaly. Respiratory: Effort is regular with non-labored breathing. Breath sounds are equal bilaterally without rales, rhonchi, wheezing or stridor.  Cardio: RRR with no MRGs. Brisk peripheral pulses without edema.  Abdomen: Active BS in all four quadrants.  Soft and non-tender without guarding, rebound tenderness, hernias or masses. Lymphatics: Non tender without lymphadenopathy.  Musculoskeletal: Full ROM, 5/5 strength, normal ambulation.  No clubbing or cyanosis. Skin: Appropriate color for ethnicity. Warm without rashes, lesions, ecchymosis, ulcers.  Neuro: CN II-XII grossly normal. Normal muscle tone without cerebellar symptoms and intact sensation.   Psych: AO X 3,  appropriate mood and affect, insight and judgment.     Adela Glimpse, NP 9:51 PM [facility]

## 2022-08-26 ENCOUNTER — Other Ambulatory Visit: Payer: Self-pay | Admitting: Internal Medicine

## 2022-08-26 DIAGNOSIS — E782 Mixed hyperlipidemia: Secondary | ICD-10-CM

## 2022-09-12 ENCOUNTER — Ambulatory Visit: Payer: Medicare Other | Admitting: Nurse Practitioner

## 2022-09-15 ENCOUNTER — Ambulatory Visit: Payer: Medicare Other | Admitting: Nurse Practitioner

## 2022-09-22 ENCOUNTER — Encounter: Payer: Self-pay | Admitting: Nurse Practitioner

## 2022-09-22 ENCOUNTER — Ambulatory Visit (INDEPENDENT_AMBULATORY_CARE_PROVIDER_SITE_OTHER): Payer: Medicare Other | Admitting: Nurse Practitioner

## 2022-09-22 VITALS — BP 140/72 | HR 58 | Temp 97.8°F | Ht 63.0 in | Wt 104.6 lb

## 2022-09-22 DIAGNOSIS — R410 Disorientation, unspecified: Secondary | ICD-10-CM | POA: Diagnosis not present

## 2022-09-22 DIAGNOSIS — N183 Chronic kidney disease, stage 3 unspecified: Secondary | ICD-10-CM

## 2022-09-22 DIAGNOSIS — B3731 Acute candidiasis of vulva and vagina: Secondary | ICD-10-CM

## 2022-09-22 DIAGNOSIS — I700 Atherosclerosis of aorta: Secondary | ICD-10-CM

## 2022-09-22 DIAGNOSIS — I10 Essential (primary) hypertension: Secondary | ICD-10-CM

## 2022-09-22 DIAGNOSIS — H353124 Nonexudative age-related macular degeneration, left eye, advanced atrophic with subfoveal involvement: Secondary | ICD-10-CM | POA: Diagnosis not present

## 2022-09-22 DIAGNOSIS — E1169 Type 2 diabetes mellitus with other specified complication: Secondary | ICD-10-CM | POA: Diagnosis not present

## 2022-09-22 DIAGNOSIS — E1122 Type 2 diabetes mellitus with diabetic chronic kidney disease: Secondary | ICD-10-CM

## 2022-09-22 DIAGNOSIS — E785 Hyperlipidemia, unspecified: Secondary | ICD-10-CM

## 2022-09-22 DIAGNOSIS — E1140 Type 2 diabetes mellitus with diabetic neuropathy, unspecified: Secondary | ICD-10-CM

## 2022-09-22 DIAGNOSIS — R6889 Other general symptoms and signs: Secondary | ICD-10-CM

## 2022-09-22 DIAGNOSIS — H353211 Exudative age-related macular degeneration, right eye, with active choroidal neovascularization: Secondary | ICD-10-CM | POA: Diagnosis not present

## 2022-09-22 DIAGNOSIS — E559 Vitamin D deficiency, unspecified: Secondary | ICD-10-CM | POA: Diagnosis not present

## 2022-09-22 DIAGNOSIS — Z79899 Other long term (current) drug therapy: Secondary | ICD-10-CM | POA: Diagnosis not present

## 2022-09-22 DIAGNOSIS — E118 Type 2 diabetes mellitus with unspecified complications: Secondary | ICD-10-CM | POA: Diagnosis not present

## 2022-09-22 DIAGNOSIS — G8929 Other chronic pain: Secondary | ICD-10-CM | POA: Diagnosis not present

## 2022-09-22 DIAGNOSIS — Z0000 Encounter for general adult medical examination without abnormal findings: Secondary | ICD-10-CM

## 2022-09-22 DIAGNOSIS — F331 Major depressive disorder, recurrent, moderate: Secondary | ICD-10-CM

## 2022-09-22 DIAGNOSIS — R21 Rash and other nonspecific skin eruption: Secondary | ICD-10-CM

## 2022-09-22 DIAGNOSIS — Z794 Long term (current) use of insulin: Secondary | ICD-10-CM

## 2022-09-22 DIAGNOSIS — Z0001 Encounter for general adult medical examination with abnormal findings: Secondary | ICD-10-CM

## 2022-09-22 MED ORDER — TRIAMCINOLONE ACETONIDE 0.025 % EX OINT: 1.0000 | TOPICAL_OINTMENT | Freq: Two times a day (BID) | CUTANEOUS | 0 refills | Status: DC

## 2022-09-22 MED ORDER — FLUCONAZOLE 150 MG PO TABS: ORAL_TABLET | ORAL | 0 refills | Status: DC

## 2022-09-22 NOTE — Progress Notes (Signed)
ANNUAL WELLNESS VISIT  Assessment:    Annual Medicare Wellness Visit Due annually  Health maintenance reviewed  Atherosclerosis of aorta (HCC)- per CT 06/2017 Discussed lifestyle modifications. Recommended diet heavy in fruits and veggies, omega 3's. Decrease consumption of animal meats, cheeses, and dairy products. Remain active and exercise as tolerated. Continue to monitor. Check lipids/TSH  Essential hypertension Discussed DASH (Dietary Approaches to Stop Hypertension) DASH diet is lower in sodium than a typical American diet. Cut back on foods that are high in saturated fat, cholesterol, and trans fats. Eat more whole-grain foods, fish, poultry, and nuts Remain active and exercise as tolerated daily.  Monitor BP at home-Call if uncontrolled.  Uncontrolled secondary diabetes mellitus with stage 3 CKD (GFR 30-59) (HCC)/ Long term current use of insulin [facility]) Education: Reviewed 'ABCs' of diabetes management  Discussed goals to be met and/or maintained include A1C (<7) Blood pressure (<130/80) Cholesterol (LDL <70) Continue Eye Exam yearly  Continue Dental Exam Q6 mo Discussed dietary recommendations Discussed Physical Activity recommendations Check A1C  Type 2 diabetes with neuropathy [facility]) Discussed general issues about diabetes pathophysiology and management., Educational material distributed. Encouraged aerobic exercise., Discussed foot care. Continue low dose Gabapentin PRN.  Mixed hyperlipidemia associated with T2DM (HCC) Discussed lifestyle modifications. Recommended diet heavy in fruits and veggies, omega 3's. Decrease consumption of animal meats, cheeses, and dairy products. Remain active and exercise as tolerated. Continue to monitor.  CKD 3 associated with T2DM (HCC) Continue to follow with Nephrology, Dr. Kathrene Bongo  Continue ACE, bASA, statin Discussed how what you eat and drink can aide in kidney protection. Stay well hydrated. Avoid high salt  foods. Avoid NSAIDS. Keep BP and BG well controlled.   Take medications as prescribed. Remain active and exercise as tolerated daily. Maintain weight.  Continue to monitor. Check CMP/GFR/Microablumin  Macular degeneration of left and right eye (HCC) - BIL Getting injections by Dr. Luciana Axe Continue to monitor  Chronic pain syndrome Ortho following Stable  Medication management All medications discussed and reviewed in full. All questions and concerns regarding medications addressed.     Vitamin D deficiency Continue to recommend supplementation for goal of 60-100 Check vitamin D level annually and after dose change  Major depression in remission (HCC)  Continue low dose wellbutrin for energy and motivation  Lifestyle discussed: diet/exerise, sleep hygiene, stress management, hydration  Vaginal candida Start Diflucan Keep BG well controlled  Rash bilateral forearm Apply triamcinolone cream to area Contact office if s/s fail to improve.   Orders Placed This Encounter  Procedures   CBC with Differential/Platelet   COMPLETE METABOLIC PANEL WITH GFR   Lipid panel   Hemoglobin A1c   VITAMIN D 25 Hydroxy (Vit-D Deficiency, Fractures)   Notify office for further evaluation and treatment, questions or concerns if s/s fail to improve. The risks and benefits of my recommendations, as well as other treatment options were discussed with the patient today. Questions were answered.   Further disposition pending results of labs. Discussed med's effects and SE's.     Over 40 minutes of exam, counseling, chart review, and critical decision making was performed.    Future Appointments  Date Time Provider Department Center  12/14/2022  2:00 PM Adela Glimpse, NP [facility] None      Plan:   During the course of the visit the patient was educated and counseled about appropriate screening and preventive services including:   Pneumococcal vaccine  Influenza vaccine Td  vaccine Screening electrocardiogram Bone densitometry screening Colorectal cancer screening Diabetes  screening Glaucoma screening Nutrition counseling  Advanced directives: requested   Subjective:   [name]  presents with husband for AWV.  Overall she feels as though she is declining in her health. Does not feel like partaking in daily activities due to feeling weak and fatigued.  Has daily aches and pains.  Wants to sleep more often than normal.  She has depression with reported lack of motivation and energy, ontinues wellbutrin 150 mg daily and doing well.    Has noticed increase in vaginal itching and yeast.  Feels as though she has a rash on her bilateral lower arms, reports itching and red raised bumps, no drainage.    She has reported unsteady gait, chronic pain, follows with [facility] (Dr. Fara Chute) and [facility], also with diabetic neuropathy. She gets injections into bil hips.  She continues using a cane today in clinic.     BMI is Body mass index is 18.53 kg/m.,  Wt Readings from Last 3 Encounters:  09/22/22 104 lb 9.6 oz (47.4 kg)  08/04/22 110 lb 3.2 oz (50 kg)  05/10/22 114 lb (51.7 kg)   Patient is treated for HTN since 2000, takes ziac daily, but asked to hold since recent BP's have been low.  BP today was BP: (!) 140/72  She has aortic atherosclerosis per CT 2017 and 2019. Patient had a negative Cardiolite in 2009. Patient has had no complaints of any cardiac type chest pain, palpitations, dyspnea/orthopnea/PND, dizziness, claudication, or dependent edema.  She has hyperlipidemia; On rosuvastatin 20 mg daily. Patient denies myalgias or other med SE's. Last Lipids were not at goal with slightly elevated triglycerides. Lab Results  Component Value Date   CHOL 138 12/07/2021   HDL 48 (L) 12/07/2021   LDLCALC 67 12/07/2021   TRIG 152 (H) 12/07/2021   CHOLHDL 2.9 12/07/2021   Also, the patient has history of T2DM x 1996 and has been on insulin  since 2000 and she has complications w/ CKD and Neuropathy.  Off of metformin due to CKD 3b; on bASA, statin Not on ACE/ARB per recommendation by nephrology Dr. Kathrene Bongo Has intermittently seen endocrinology though not consistently, recently has been managed by our office though with suspected poor compliance with recommended diet/med regimen/appointments She is on novolin 70/30, today reports has been taking 18-20 units with each meal, 2-3 times a day depending on BG readings, husband has been administering. BG have been around 70-100.  She does report tingling and occasional painful paresthesias of the feet.  She takes requip for RLS, reports hemp oil helping with foot pain.  Lab Results  Component Value Date   HGBA1C 7.0 (H) 12/07/2021   She follows with [facility] Dr. Kathrene Bongo, advised to stay off of ACE/ARB (caused deterioration) and NSAIDs.  Lab Results  Component Value Date   GFRNONAA 40 (L) 12/21/2021   GFRNONAA 39 (L) 12/10/2021   GFRNONAA 41 (L) 11/23/2021   Further, the patient also has history of Vitamin D Deficiency, unsure how much she takes, "3-4"  Lab Results  Component Value Date   VD25OH 60 12/07/2021   She takes B12 SL 1000 mcg daily Lab Results  Component Value Date   VITAMINB12 777 12/03/2020   She had unexplained leukocytosis last OV, path smear without clear abnormality, she did have joint injection the day prior and recent UTI.    Latest Ref Rng & Units 01/04/2022   12:00 AM 12/21/2021    3:53 AM 12/10/2021    2:08 PM  CBC  WBC 3.8 - 10.8 Thousand/uL 9.3  14.5  10.2   Hemoglobin 11.7 - 15.5 g/dL 16.1  09.6  04.5   Hematocrit 35.0 - 45.0 % 32.5  34.1  37.1   Platelets 140 - 400 Thousand/uL 305  309  318        Current Outpatient Medications on File Prior to Visit  Medication Sig   aspirin 81 MG tablet Take 81 mg by mouth daily.   bisoprolol-hydrochlorothiazide (ZIAC) 2.5-6.25 MG tablet Take  1 tablet  Daily  for BP   Blood Glucose  Calibration (ACCU-CHEK AVIVA) SOLN Check blood sugar 3 times daily-DX-E11.22   Blood Glucose Monitoring Suppl DEVI Use to test blood sugar three times a day   buPROPion (WELLBUTRIN XL) 150 MG 24 hr tablet Take 1 tablet every Morning for Mood , Focus & Concentration   busPIRone (BUSPAR) 5 MG tablet Take 1 tab three times a day as needed for mood/anxiety.   Cholecalciferol (VITAMIN D3) 2000 UNITS TABS Take 1 capsule by mouth 2 (two) times daily.   COMFORT EZ PEN NEEDLES 32G X 4 MM MISC USE 2 TIMES DAILY AS DIRECTED , 1 PEN NEEDLE PER INJECTION.   Continuous Blood Gluc Sensor (FREESTYLE LIBRE SENSOR SYSTEM) MISC Check blood sugar 3 times a day. Dx:E11.8, Z79.4   Continuous Blood Gluc Sensor MISC Check blood sugar 3 times a day- Dx: E11.8/Z79.4   docusate sodium (COLACE) 100 MG capsule Take 1 capsule twice daily as needed for constipation with a bottle of water.   dorzolamide-timolol (COSOPT) 22.3-6.8 MG/ML ophthalmic solution Place 1 drop into both eyes 2 (two) times daily.   DULoxetine (CYMBALTA) 20 MG capsule Take 1 cap daily for mood and chronic pain.   EASY COMFORT INSULIN SYRINGE 30G X 1/2" 0.5 ML MISC    glucose blood (ONETOUCH VERIO) test strip USE 1 STRIP TO CHECK GLUCOSE THREE TIMES DAILY AS DIRECTED   Insulin Glargine (BASAGLAR KWIKPEN) 100 UNIT/ML DIAL AND INJECT 30 UNITS UNDER THE SKIN ONCE DAILY AS DIRECTED   ipratropium (ATROVENT) 0.03 % nasal spray Place 2 sprays into the nose 3 (three) times daily.   Lancet Devices (ADJUSTABLE LANCING DEVICE) MISC CHECK BLOOD SUGAR 3 TIMES A DAY   Lancets MISC Use to test blood sugar three times a day   latanoprost (XALATAN) 0.005 % ophthalmic solution Place 1 drop into both eyes at bedtime.   linaclotide (LINZESS) 72 MCG capsule Start taking 1 cap daily for constipation.   meclizine (ANTIVERT) 25 MG tablet Take 0.5-1 tablets (12.5-25 mg total) by mouth 3 (three) times daily as needed for dizziness.   MELATONIN PO Take by mouth.   methocarbamol  (ROBAXIN) 500 MG tablet Take 1 tablet (500 mg total) by mouth 4 (four) times daily.   Multiple Vitamins-Minerals (CENTRUM SILVER 50+WOMEN) TABS Take 1 tablet by mouth daily.   Multiple Vitamins-Minerals (ICAPS AREDS 2 PO) Take 1 capsule by mouth daily.   rOPINIRole (REQUIP) 3 MG tablet Take 1 to 2 tablets  at Bedtime  for Restless Legs                                                               /  TAKE  BY MOUTH   rosuvastatin (CRESTOR) 20 MG tablet Take   1 tablet  Daily for Cholesterol                                                                 /                                                          TAKE                                              BY                                        MOUTH                                      ONCE ? DAILY   traMADol (ULTRAM) 50 MG tablet Take 50 mg by mouth every 6 (six) hours as needed for moderate pain. Pt only takes 2 pills per night   vitamin B-12 (CYANOCOBALAMIN) 1000 MCG tablet Take 1,000 mcg by mouth daily.   ciprofloxacin (CIPRO) 250 MG tablet Take 1 tablet 2 x /day with Food for Infection (Patient not taking: Reported on 09/22/2022)   No current facility-administered medications on file prior to visit.   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) 11/18/2019   Exudative age-related macular degeneration of right eye with active choroidal neovascularization (HCC) 08/19/2019   Exudative age-related macular degeneration of left eye with active  choroidal neovascularization (HCC) 08/19/2019   Long term (current) use of insulin (HCC) 06/10/2019   Major depression, recurrent (HCC) 12/20/2018   Type 2 diabetes mellitus with complication, with long-term current use of insulin (HCC) 02/09/2017   Atherosclerosis of aorta (HCC) by AbdCTscan on 06/28/2017 11/06/2016   Encounter for Medicare annual wellness exam 05/18/2015   Medication management 08/06/2013   Neuropathy due to type 2 diabetes mellitus (HCC) 06/04/2013   CKD stage 3 due to type 2 diabetes mellitus (HCC)    Hyperlipidemia associated with type 2 diabetes mellitus (HCC)    Vitamin D deficiency    Hypertension 07/29/2012   Chronic pain 07/29/2012    Screening Tests  Immunization History  Administered Date(s) Administered   DT (Pediatric) 01/04/2011   Influenza Split 01/23/2013   Influenza, High Dose Seasonal PF 02/27/2014, 03/11/2015, 12/21/2015, 01/30/2017, 02/08/2018, 01/02/2019,  01/29/2021, 03/08/2022   Influenza-Unspecified 02/03/2020   PFIZER(Purple Top)SARS-COV-2 Vaccination 07/22/2019, 08/13/2019   Pneumococcal Conjugate-13 02/27/2014   Pneumococcal Polysaccharide-23 01/06/2012   Preventative care: Last colonoscopy: 2006 will not get another due to age [facility] 12/2019 - defers further - would not pursue tmt DEXA 2007 - defers further - would not pursue tmt  Tetanus 2012 Flu - 2023 Pneumo 2013 Prevnar 13 2015 Shingles vaccine declines Covid 19: 2/2, 2021, pfizer + 2 boosters  Names of Other Physician/Practitioners you currently use: 1. [facility] here for primary care 2. [facility], eye doctor, last visit in 09/24/2021, no retinopathy, report abstracted,  3. Dr Conception Chancy, DDS, dentist, last visit 2023, goes q78m  Patient Care Team: Lucky Cowboy, MD as PCP - General (Internal Medicine) Sallye Lat, MD as Consulting Physician (Optometry) Luciana Axe, Alford Highland, MD as Consulting Physician  (Ophthalmology) Trey Sailors, MD as Attending Physician (Neurosurgery) Marcene Corning, MD as Consulting Physician (Orthopedic Surgery) Rachael Fee, MD as Attending Physician (Gastroenterology) Charlett Nose, [facility] (Inactive) as Pharmacist (Pharmacist)   Allergies Allergies  Allergen Reactions   Morphine And Codeine Other (See Comments)    fuzzy feeling    SURGICAL HISTORY She  has a past surgical history that includes Abdominal hysterectomy; Cesarean section; Back surgery (1993); Appendectomy; Colonoscopy; Eye surgery; Trigger finger release (01/10/2012); and Breast excisional biopsy (Right, 1967). FAMILY HISTORY Her family history includes Diabetes in her brother and sister; Kidney cancer in her brother; Liver cancer in her sister; Liver disease in her sister; Lymphoma in her sister; Stroke in her brother. SOCIAL HISTORY She  reports that she has never smoked. She has never used smokeless tobacco. She reports that she does not drink alcohol and does not use drugs.  MEDICARE WELLNESS OBJECTIVES: Physical activity:   Cardiac risk factors:   Depression/mood screen:      09/22/2022    9:58 PM  Depression screen PHQ 2/9  Decreased Interest 1  Down, Depressed, Hopeless 1  PHQ - 2 Score 2  Altered sleeping 0  Tired, decreased energy 1  Change in appetite 1  Feeling bad or failure about yourself  0  Trouble concentrating 0  Moving slowly or fidgety/restless 0  Suicidal thoughts 0  PHQ-9 Score 4  Difficult doing work/chores Somewhat difficult    ADLs:     09/22/2022    9:58 PM 12/27/2021    8:10 PM  In your present state of health, do you have any difficulty performing the following activities:  Hearing? 0 0  Vision? 1 0  Difficulty concentrating or making decisions? 0 0  Walking or climbing stairs? 1 1  Comment  Uses can, chronic pain, able to manage.  Dressing or bathing? 0 0  Doing errands, shopping? 1 1  Comment  Husband drives her to appointments, grocery  Preparing  Food and eating ?  N  Using the Toilet?  N  In the past six months, have you accidently leaked urine?  Y  Comment  Recent UTI  Do you have problems with loss of bowel control?  N  Managing your Medications?  N  Managing your Finances?  N  Housekeeping or managing your Housekeeping?  N     Cognitive Testing  Alert? Yes  Normal Appearance?Yes  Oriented to person? Yes  Place? Yes   Time? Yes  Recall of three objects?  Yes  Can perform simple calculations? Yes  Displays appropriate judgment?Yes  Can read the correct time from a  watch face?Yes  EOL planning:       Review of Systems  Constitutional:  Negative for malaise/fatigue and weight loss.  HENT:  Negative for hearing loss and tinnitus.   Eyes:  Negative for blurred vision and double vision.  Respiratory:  Negative for cough, shortness of breath and wheezing.   Cardiovascular:  Negative for chest pain, palpitations, orthopnea, claudication and leg swelling.  Gastrointestinal:  Negative for abdominal pain, blood in stool, constipation, diarrhea, heartburn, melena, nausea and vomiting.  Genitourinary: Negative.   Musculoskeletal:  Positive for back pain (ortho/neuro following) and joint pain (hips, improved after injections). Negative for myalgias.  Skin:  Negative for rash.  Neurological:  Negative for dizziness, tingling, sensory change, weakness and headaches.  Endo/Heme/Allergies:  Negative for polydipsia.  Psychiatric/Behavioral: Negative.  Negative for depression, memory loss and substance abuse. The patient is not nervous/anxious and does not have insomnia.   All other systems reviewed and are negative.    Objective:     Blood pressure (!) 140/72, pulse (!) 58, temperature 97.8 F (36.6 C), height 5\' 3"  (1.6 m), weight 104 lb 9.6 oz (47.4 kg), SpO2 98 %.  General Appearance: Well nourished, alert, WD/WN, female elder  in no apparent distress. Eyes: PERRLA, EOMs, conjunctiva no swelling or erythema Sinuses: No  frontal/maxillary tenderness ENT/Mouth: EACs patent / TMs  nl. Nares clear without erythema, swelling, mucoid exudates. Oral hygiene is good. No erythema, swelling, or exudate. Tongue normal, non-obstructing. Tonsils not swollen or erythematous. Hearing normal.  Neck: Supple, thyroid normal without palpable nodules. No bruits, nodes or JVD. Respiratory: Respiratory effort normal.  BS equal and clear bilateral without rales, rhonci, wheezing or stridor. Cardio: Heart sounds are normal with regular rate and rhythm and no murmurs, rubs or gallops. Peripheral pulses are normal and equal bilaterally without edema. No aortic or femoral bruits. Abdomen: Flat, soft  with nl bowel sounds. Nontender, no guarding, rebound, hernias, masses, or organomegaly.  Lymphatics: Non tender without lymphadenopathy.  Musculoskeletal: Full ROM all peripheral extremities, joint stability, 5/5 strength, and slow steady gait.  Skin: warm/dry intact Neuro: Cranial nerves intact. DTR's UE  Nl/Equal and DTR's LE's absent. Normal muscle tone, no cerebellar symptoms. Sensation decreased R foot to monofilament.  Pysch: Alert and oriented X 3, normal affect, Insight and Judgment are fair GU: defer, no concerns  Medicare Attestation I have personally reviewed: The patient's medical and social history Their use of alcohol, tobacco or illicit drugs Their current medications and supplements The patient's functional ability including ADLs,fall risks, home safety risks, cognitive, and hearing and visual impairment Diet and physical activities Evidence for depression or mood disorders  The patient's weight, height, BMI, and visual acuity have been recorded in the chart.  I have made referrals, counseling, and provided education to the patient based on review of the above and I have provided the patient with a written personalized care plan for preventive services.     Adela Glimpse, NP   09/22/2022

## 2022-09-22 NOTE — Patient Instructions (Signed)

## 2022-09-23 LAB — COMPLETE METABOLIC PANEL WITH GFR
AG Ratio: 1.4 (calc) (ref 1.0–2.5)
ALT: 23 U/L (ref 6–29)
AST: 34 U/L (ref 10–35)
Albumin: 4 g/dL (ref 3.6–5.1)
Alkaline phosphatase (APISO): 91 U/L (ref 37–153)
BUN/Creatinine Ratio: 13 (calc) (ref 6–22)
BUN: 21 mg/dL (ref 7–25)
CO2: 30 mmol/L (ref 20–32)
Calcium: 9.8 mg/dL (ref 8.6–10.4)
Chloride: 94 mmol/L — ABNORMAL LOW (ref 98–110)
Creat: 1.65 mg/dL — ABNORMAL HIGH (ref 0.60–0.95)
Globulin: 2.9 g/dL (calc) (ref 1.9–3.7)
Glucose, Bld: 563 mg/dL (ref 65–99)
Potassium: 4.6 mmol/L (ref 3.5–5.3)
Sodium: 135 mmol/L (ref 135–146)
Total Bilirubin: 0.6 mg/dL (ref 0.2–1.2)
Total Protein: 6.9 g/dL (ref 6.1–8.1)
eGFR: 29 mL/min/{1.73_m2} — ABNORMAL LOW (ref >=60)

## 2022-09-23 LAB — LIPID PANEL
Cholesterol: 119 mg/dL (ref <200)
HDL: 44 mg/dL — ABNORMAL LOW (ref >=50)
LDL Cholesterol (Calc): 47 mg/dL (calc)
Non-HDL Cholesterol (Calc): 75 mg/dL (calc) (ref <130)
Total CHOL/HDL Ratio: 2.7 (calc) (ref <5.0)
Triglycerides: 215 mg/dL — ABNORMAL HIGH (ref <150)

## 2022-09-23 LAB — CBC WITH DIFFERENTIAL/PLATELET
Absolute Monocytes: 799 cells/uL (ref 200–950)
Basophils Absolute: 56 cells/uL (ref 0–200)
Basophils Relative: 0.6 %
Eosinophils Absolute: 188 cells/uL (ref 15–500)
Eosinophils Relative: 2 %
HCT: 39 % (ref 35.0–45.0)
Hemoglobin: 12.6 g/dL (ref 11.7–15.5)
Lymphs Abs: 3027 cells/uL (ref 850–3900)
MCH: 31.7 pg (ref 27.0–33.0)
MCHC: 32.3 g/dL (ref 32.0–36.0)
MCV: 98.2 fL (ref 80.0–100.0)
MPV: 13.3 fL — ABNORMAL HIGH (ref 7.5–12.5)
Monocytes Relative: 8.5 %
Neutro Abs: 5330 cells/uL (ref 1500–7800)
Neutrophils Relative %: 56.7 %
Platelets: 210 10*3/uL (ref 140–400)
RBC: 3.97 10*6/uL (ref 3.80–5.10)
RDW: 11.5 % (ref 11.0–15.0)
Total Lymphocyte: 32.2 %
WBC: 9.4 10*3/uL (ref 3.8–10.8)

## 2022-09-23 LAB — VITAMIN D 25 HYDROXY (VIT D DEFICIENCY, FRACTURES): Vit D, 25-Hydroxy: 46 ng/mL (ref 30–100)

## 2022-09-23 LAB — HEMOGLOBIN A1C

## 2022-11-01 ENCOUNTER — Ambulatory Visit (INDEPENDENT_AMBULATORY_CARE_PROVIDER_SITE_OTHER): Payer: Medicare Other | Admitting: Nurse Practitioner

## 2022-11-01 ENCOUNTER — Encounter: Payer: Self-pay | Admitting: Nurse Practitioner

## 2022-11-01 VITALS — BP 108/64 | HR 69 | Temp 97.7°F | Ht 63.0 in | Wt 102.8 lb

## 2022-11-01 DIAGNOSIS — R350 Frequency of micturition: Secondary | ICD-10-CM | POA: Diagnosis not present

## 2022-11-01 DIAGNOSIS — Z794 Long term (current) use of insulin: Secondary | ICD-10-CM | POA: Diagnosis not present

## 2022-11-01 DIAGNOSIS — R102 Pelvic and perineal pain: Secondary | ICD-10-CM

## 2022-11-01 DIAGNOSIS — E118 Type 2 diabetes mellitus with unspecified complications: Secondary | ICD-10-CM

## 2022-11-01 DIAGNOSIS — Z79899 Other long term (current) drug therapy: Secondary | ICD-10-CM

## 2022-11-01 DIAGNOSIS — R300 Dysuria: Secondary | ICD-10-CM | POA: Diagnosis not present

## 2022-11-01 LAB — CBC WITH DIFFERENTIAL/PLATELET
Basophils Relative: 0.4 %
Eosinophils Relative: 1.3 %
HCT: 42.6 % (ref 35.0–45.0)
Lymphs Abs: 3234 cells/uL (ref 850–3900)
Monocytes Relative: 5.8 %
Platelets: 292 10*3/uL (ref 140–400)
RBC: 4.35 10*6/uL (ref 3.80–5.10)
Total Lymphocyte: 14 %

## 2022-11-01 MED ORDER — CIPROFLOXACIN HCL 250 MG PO TABS
ORAL_TABLET | ORAL | 0 refills | Status: DC
Start: 2022-11-01 — End: 2023-01-18

## 2022-11-01 NOTE — Patient Instructions (Signed)
Urinary Tract Infection, Adult A urinary tract infection (UTI) is an infection of any part of the urinary tract. The urinary tract includes: The kidneys. The ureters. The bladder. The urethra. These organs make, store, and get rid of pee (urine) in the body. What are the causes? This infection is caused by germs (bacteria) in your genital area. These germs grow and cause swelling (inflammation) of your urinary tract. What increases the risk? The following factors may make you more likely to develop this condition: Using a small, thin tube (catheter) to drain pee. Not being able to control when you pee or poop (incontinence). Being female. If you are female, these things can increase the risk: Using these methods to prevent pregnancy: A medicine that kills sperm (spermicide). A device that blocks sperm (diaphragm). Having low levels of a female hormone (estrogen). Being pregnant. You are more likely to develop this condition if: You have genes that add to your risk. You are sexually active. You take antibiotic medicines. You have trouble peeing because of: A prostate that is bigger than normal, if you are female. A blockage in the part of your body that drains pee from the bladder. A kidney stone. A nerve condition that affects your bladder. Not getting enough to drink. Not peeing often enough. You have other conditions, such as: Diabetes. A weak disease-fighting system (immune system). Sickle cell disease. Gout. Injury of the spine. What are the signs or symptoms? Symptoms of this condition include: Needing to pee right away. Peeing small amounts often. Pain or burning when peeing. Blood in the pee. Pee that smells bad or not like normal. Trouble peeing. Pee that is cloudy. Fluid coming from the vagina, if you are female. Pain in the belly or lower back. Other symptoms include: Vomiting. Not feeling hungry. Feeling mixed up (confused). This may be the first symptom in  older adults. Being tired and grouchy (irritable). A fever. Watery poop (diarrhea). How is this treated? Taking antibiotic medicine. Taking other medicines. Drinking enough water. In some cases, you may need to see a specialist. Follow these instructions at home:  Medicines Take over-the-counter and prescription medicines only as told by your doctor. If you were prescribed an antibiotic medicine, take it as told by your doctor. Do not stop taking it even if you start to feel better. General instructions Make sure you: Pee until your bladder is empty. Do not hold pee for a long time. Empty your bladder after sex. Wipe from front to back after peeing or pooping if you are a female. Use each tissue one time when you wipe. Drink enough fluid to keep your pee pale yellow. Keep all follow-up visits. Contact a doctor if: You do not get better after 1-2 days. Your symptoms go away and then come back. Get help right away if: You have very bad back pain. You have very bad pain in your lower belly. You have a fever. You have chills. You feeling like you will vomit or you vomit. Summary A urinary tract infection (UTI) is an infection of any part of the urinary tract. This condition is caused by germs in your genital area. There are many risk factors for a UTI. Treatment includes antibiotic medicines. Drink enough fluid to keep your pee pale yellow. This information is not intended to replace advice given to you by your health care provider. Make sure you discuss any questions you have with your health care provider. Document Revised: 11/17/2019 Document Reviewed: 11/22/2019 Elsevier Patient Education    2024 [employer]  

## 2022-11-01 NOTE — Progress Notes (Signed)
Assessment and Plan:  [name] was seen today for an episodic visit.  Diagnoses and all order for this visit:  Dysuria Start treatment with abx Stay well hydrated to keep urinary system well flushed Keep BG well controlled Monitor for increase in fever, chills, N/V, AMS and report to ER if noticed.  - CBC with Differential/Platelet - Urinalysis, Routine w reflex microscopic - Urine Culture - ciprofloxacin (CIPRO) 250 MG tablet; Take 1 tablet 2 x /day with Food for Infection  Dispense: 14 tablet; Refill: 0  Urinary frequency  - Urinalysis, Routine w reflex microscopic - Urine Culture  Suprapubic pain  - Urinalysis, Routine w reflex microscopic - Urine Culture  Type 2 diabetes mellitus with complication, with long-term current use of insulin (HCC)  - CBC with Differential/Platelet - COMPLETE METABOLIC PANEL WITH GFR  Medication management All medications discussed and reviewed in full. All questions and concerns regarding medications addressed.    - CBC with Differential/Platelet - COMPLETE METABOLIC PANEL WITH GFR - Urinalysis, Routine w reflex microscopic - Urine Culture  Notify office for further evaluation and treatment, questions or concerns if s/s fail to improve. The risks and benefits of my recommendations, as well as other treatment options were discussed with the patient today. Questions were answered.  Further disposition pending results of labs. Discussed med's effects and SE's.    Over 15 minutes of exam, counseling, chart review, and critical decision making was performed.   Future Appointments  Date Time Provider Department Center  12/14/2022  2:00 PM Ebonye Reade, Archie Patten, NP [facility] None    ------------------------------------------------------------------------------------------------------------------   HPI BP 108/64   Pulse 69   Temp 97.7 F (36.5 C)   Ht 5\' 3"  (1.6 m)   Wt 102 lb 12.8 oz (46.6 kg)   SpO2 99%   BMI 18.21 kg/m    Patient complains of dysuria, frequency, and suprapubic pressure. She has had symptoms for 1 week. Patient also complains of back pain. Patient denies fever, stomach ache, and vaginal discharge. Patient does not have a history of recurrent UTI. Patient does not have a history of pyelonephritis.   She has hx of DM2.  Reports BG have been well controlled averaging between 70-150.   Past Medical History:  Diagnosis Date   Arthritis    Chronic back pain    Cystoid macular edema of right eye 08/19/2019   Diabetic neuropathy (HCC)    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) 11/18/2019   Hypercholesteremia    Hypertension    Liver lesion    Neuropathy    Type II or unspecified type diabetes mellitus without mention of complication, not stated as uncontrolled    Vitamin D deficiency    Wears glasses      Allergies  Allergen Reactions   Morphine And Codeine Other (See Comments)    fuzzy feeling    Current Outpatient Medications on File Prior to Visit  Medication Sig   aspirin 81 MG tablet Take 81 mg by mouth daily.   bisoprolol-hydrochlorothiazide (ZIAC) 2.5-6.25 MG tablet Take  1 tablet  Daily  for BP   Blood Glucose Calibration (ACCU-CHEK AVIVA) SOLN Check blood sugar 3 times daily-DX-E11.22   Blood Glucose Monitoring Suppl DEVI Use to test blood sugar three times a day   buPROPion (WELLBUTRIN XL) 150 MG 24 hr tablet Take 1 tablet every Morning for Mood , Focus & Concentration   busPIRone (BUSPAR) 5 MG tablet Take 1 tab three times a  day as needed for mood/anxiety.   Cholecalciferol (VITAMIN D3) 2000 UNITS TABS Take 1 capsule by mouth 2 (two) times daily.   COMFORT EZ PEN NEEDLES 32G X 4 MM MISC USE 2 TIMES DAILY AS DIRECTED , 1 PEN NEEDLE PER INJECTION.   Continuous Blood Gluc Sensor (FREESTYLE LIBRE SENSOR SYSTEM) MISC Check blood sugar 3 times a day. Dx:E11.8, Z79.4   Continuous Blood Gluc Sensor MISC Check blood sugar  3 times a day- Dx: E11.8/Z79.4   docusate sodium (COLACE) 100 MG capsule Take 1 capsule twice daily as needed for constipation with a bottle of water.   dorzolamide-timolol (COSOPT) 22.3-6.8 MG/ML ophthalmic solution Place 1 drop into both eyes 2 (two) times daily.   DULoxetine (CYMBALTA) 20 MG capsule Take 1 cap daily for mood and chronic pain.   EASY COMFORT INSULIN SYRINGE 30G X 1/2" 0.5 ML MISC    glucose blood (ONETOUCH VERIO) test strip USE 1 STRIP TO CHECK GLUCOSE THREE TIMES DAILY AS DIRECTED   Insulin Glargine (BASAGLAR KWIKPEN) 100 UNIT/ML DIAL AND INJECT 30 UNITS UNDER THE SKIN ONCE DAILY AS DIRECTED   Lancet Devices (ADJUSTABLE LANCING DEVICE) MISC CHECK BLOOD SUGAR 3 TIMES A DAY   Lancets MISC Use to test blood sugar three times a day   latanoprost (XALATAN) 0.005 % ophthalmic solution Place 1 drop into both eyes at bedtime.   linaclotide (LINZESS) 72 MCG capsule Start taking 1 cap daily for constipation.   meclizine (ANTIVERT) 25 MG tablet Take 0.5-1 tablets (12.5-25 mg total) by mouth 3 (three) times daily as needed for dizziness.   MELATONIN PO Take by mouth.   methocarbamol (ROBAXIN) 500 MG tablet Take 1 tablet (500 mg total) by mouth 4 (four) times daily.   Multiple Vitamins-Minerals (CENTRUM SILVER 50+WOMEN) TABS Take 1 tablet by mouth daily.   Multiple Vitamins-Minerals (ICAPS AREDS 2 PO) Take 1 capsule by mouth daily.   rOPINIRole (REQUIP) 3 MG tablet Take 1 to 2 tablets  at Bedtime  for Restless Legs                                                               /                     TAKE  BY MOUTH   rosuvastatin (CRESTOR) 20 MG tablet Take   1 tablet  Daily for Cholesterol                                                                 /                                                          TAKE  BY                                        MOUTH                                      ONCE ? DAILY   traMADol (ULTRAM) 50 MG tablet Take  50 mg by mouth every 6 (six) hours as needed for moderate pain. Pt only takes 2 pills per night   triamcinolone (KENALOG) 0.025 % ointment Apply 1 Application topically 2 (two) times daily.   vitamin B-12 (CYANOCOBALAMIN) 1000 MCG tablet Take 1,000 mcg by mouth daily.   fluconazole (DIFLUCAN) 150 MG tablet Take 1 tablet (150 mg) at the sign of symptoms.  If not resolved after 72 hours may take additional 150 mg dosage. (Patient not taking: Reported on 11/01/2022)   ipratropium (ATROVENT) 0.03 % nasal spray Place 2 sprays into the nose 3 (three) times daily.   No current facility-administered medications on file prior to visit.    ROS: all negative except what is noted in the HPI.   Physical Exam:  BP 108/64   Pulse 69   Temp 97.7 F (36.5 C)   Ht 5\' 3"  (1.6 m)   Wt 102 lb 12.8 oz (46.6 kg)   SpO2 99%   BMI 18.21 kg/m   General Appearance: NAD.  Awake, conversant and cooperative. Eyes: PERRLA, EOMs intact.  Sclera white.  Conjunctiva without erythema. Sinuses: No frontal/maxillary tenderness.  No nasal discharge. Nares patent.  ENT/Mouth: Ext aud canals clear.  Bilateral TMs w/DOL and without erythema or bulging. Hearing intact.  Posterior pharynx without swelling or exudate.  Tonsils without swelling or erythema.  Neck: Supple.  No masses, nodules or thyromegaly. Respiratory: Effort is regular with non-labored breathing. Breath sounds are equal bilaterally without rales, rhonchi, wheezing or stridor.  Cardio: RRR with no MRGs. Brisk peripheral pulses without edema.  Abdomen: Active BS in all four quadrants.  Soft and non-tender without guarding, rebound tenderness, hernias or masses. Lymphatics: Non tender without lymphadenopathy.  Musculoskeletal: Full ROM, 5/5 strength, normal ambulation.  No clubbing or cyanosis. Skin: Appropriate color for ethnicity. Warm without rashes, lesions, ecchymosis, ulcers.  Neuro: CN II-XII grossly normal. Normal muscle tone without cerebellar symptoms  and intact sensation.   Psych: AO X 3,  appropriate mood and affect, insight and judgment.     Adela Glimpse, NP 2:14 PM [facility]

## 2022-11-02 ENCOUNTER — Encounter (HOSPITAL_COMMUNITY): Payer: Self-pay

## 2022-11-02 ENCOUNTER — Other Ambulatory Visit: Payer: Self-pay

## 2022-11-02 ENCOUNTER — Emergency Department (HOSPITAL_COMMUNITY): Payer: Medicare Other

## 2022-11-02 ENCOUNTER — Observation Stay (HOSPITAL_COMMUNITY)
Admission: EM | Admit: 2022-11-02 | Discharge: 2022-11-03 | Disposition: A | Discharge status: Home / Self Care | Payer: Medicare Other | Source: Home / Self Care | Attending: Emergency Medicine | Admitting: Emergency Medicine

## 2022-11-02 DIAGNOSIS — Z79899 Other long term (current) drug therapy: Secondary | ICD-10-CM | POA: Insufficient documentation

## 2022-11-02 DIAGNOSIS — R300 Dysuria: Secondary | ICD-10-CM | POA: Diagnosis present

## 2022-11-02 DIAGNOSIS — E1122 Type 2 diabetes mellitus with diabetic chronic kidney disease: Secondary | ICD-10-CM | POA: Diagnosis not present

## 2022-11-02 DIAGNOSIS — N390 Urinary tract infection, site not specified: Principal | ICD-10-CM | POA: Diagnosis present

## 2022-11-02 DIAGNOSIS — R7989 Other specified abnormal findings of blood chemistry: Secondary | ICD-10-CM | POA: Diagnosis not present

## 2022-11-02 DIAGNOSIS — R269 Unspecified abnormalities of gait and mobility: Secondary | ICD-10-CM | POA: Insufficient documentation

## 2022-11-02 DIAGNOSIS — N3090 Cystitis, unspecified without hematuria: Principal | ICD-10-CM

## 2022-11-02 DIAGNOSIS — Z794 Long term (current) use of insulin: Secondary | ICD-10-CM | POA: Diagnosis not present

## 2022-11-02 DIAGNOSIS — R2689 Other abnormalities of gait and mobility: Secondary | ICD-10-CM | POA: Insufficient documentation

## 2022-11-02 DIAGNOSIS — E1165 Type 2 diabetes mellitus with hyperglycemia: Secondary | ICD-10-CM | POA: Diagnosis not present

## 2022-11-02 DIAGNOSIS — I10 Essential (primary) hypertension: Secondary | ICD-10-CM | POA: Diagnosis not present

## 2022-11-02 DIAGNOSIS — Z7982 Long term (current) use of aspirin: Secondary | ICD-10-CM | POA: Insufficient documentation

## 2022-11-02 DIAGNOSIS — I129 Hypertensive chronic kidney disease with stage 1 through stage 4 chronic kidney disease, or unspecified chronic kidney disease: Secondary | ICD-10-CM | POA: Insufficient documentation

## 2022-11-02 DIAGNOSIS — E785 Hyperlipidemia, unspecified: Secondary | ICD-10-CM | POA: Diagnosis not present

## 2022-11-02 DIAGNOSIS — E1169 Type 2 diabetes mellitus with other specified complication: Secondary | ICD-10-CM | POA: Diagnosis not present

## 2022-11-02 DIAGNOSIS — N3000 Acute cystitis without hematuria: Secondary | ICD-10-CM | POA: Diagnosis not present

## 2022-11-02 DIAGNOSIS — E118 Type 2 diabetes mellitus with unspecified complications: Secondary | ICD-10-CM | POA: Diagnosis not present

## 2022-11-02 DIAGNOSIS — N1832 Chronic kidney disease, stage 3b: Secondary | ICD-10-CM | POA: Insufficient documentation

## 2022-11-02 DIAGNOSIS — N189 Chronic kidney disease, unspecified: Secondary | ICD-10-CM | POA: Diagnosis not present

## 2022-11-02 DIAGNOSIS — D72829 Elevated white blood cell count, unspecified: Secondary | ICD-10-CM | POA: Diagnosis not present

## 2022-11-02 DIAGNOSIS — N179 Acute kidney failure, unspecified: Secondary | ICD-10-CM | POA: Diagnosis present

## 2022-11-02 LAB — CBC WITH DIFFERENTIAL/PLATELET
Abs Immature Granulocytes: 0.03 10*3/uL (ref 0.00–0.07)
Basophils Absolute: 0.1 10*3/uL (ref 0.0–0.1)
Basophils Relative: 1 %
Eosinophils Absolute: 0.4 10*3/uL (ref 0.0–0.5)
Eosinophils Relative: 3 %
HCT: 37.5 % (ref 36.0–46.0)
Hemoglobin: 12.6 g/dL (ref 12.0–15.0)
Immature Granulocytes: 0 %
Lymphocytes Relative: 36 %
Lymphs Abs: 4.4 10*3/uL — ABNORMAL HIGH (ref 0.7–4.0)
MCH: 31.7 pg (ref 26.0–34.0)
MCHC: 33.6 g/dL (ref 30.0–36.0)
MCV: 94.2 fL (ref 80.0–100.0)
MPV: 12 fL (ref 7.5–12.5)
Monocytes Absolute: 0.8 10*3/uL (ref 0.1–1.0)
Monocytes Relative: 7 %
Neutro Abs: 18134 cells/uL — ABNORMAL HIGH (ref 1500–7800)
Neutro Abs: 6.5 10*3/uL (ref 1.7–7.7)
Neutrophils Relative %: 53 %
Platelets: 243 10*3/uL (ref 150–400)
RBC: 3.98 MIL/uL (ref 3.87–5.11)
RDW: 12.2 % (ref 11.5–15.5)
WBC: 23.1 10*3/uL — ABNORMAL HIGH (ref 3.8–10.8)
WBC: 12.2 10*3/uL — ABNORMAL HIGH (ref 4.0–10.5)
nRBC: 0 % (ref 0.0–0.2)

## 2022-11-02 LAB — BASIC METABOLIC PANEL
Anion gap: 12 (ref 5–15)
BUN: 41 mg/dL — ABNORMAL HIGH (ref 8–23)
CO2: 24 mmol/L (ref 22–32)
Calcium: 9.2 mg/dL (ref 8.9–10.3)
Chloride: 98 mmol/L (ref 98–111)
Creatinine, Ser: 1.91 mg/dL — ABNORMAL HIGH (ref 0.44–1.00)
GFR, Estimated: 25 mL/min — ABNORMAL LOW (ref >60)
Glucose, Bld: 288 mg/dL — ABNORMAL HIGH (ref 70–99)
Potassium: 3.9 mmol/L (ref 3.5–5.1)
Sodium: 134 mmol/L — ABNORMAL LOW (ref 135–145)

## 2022-11-02 LAB — I-STAT VENOUS BLOOD GAS, ED
Acid-Base Excess: 1 mmol/L (ref 0.0–2.0)
Bicarbonate: 25.5 mmol/L (ref 20.0–28.0)
Calcium, Ion: 1.18 mmol/L (ref 1.15–1.40)
HCT: 38 % (ref 36.0–46.0)
Hemoglobin: 12.9 g/dL (ref 12.0–15.0)
O2 Saturation: 95 %
Potassium: 3.9 mmol/L (ref 3.5–5.1)
Sodium: 135 mmol/L (ref 135–145)
TCO2: 27 mmol/L (ref 22–32)
pCO2, Ven: 38.1 mmHg — ABNORMAL LOW (ref 44–60)
pH, Ven: 7.434 — ABNORMAL HIGH (ref 7.25–7.43)
pO2, Ven: 74 mmHg — ABNORMAL HIGH (ref 32–45)

## 2022-11-02 LAB — URINALYSIS, W/ REFLEX TO CULTURE (INFECTION SUSPECTED)
Bilirubin Urine: NEGATIVE
Glucose, UA: >=500 mg/dL — AB
Ketones, ur: NEGATIVE mg/dL
Nitrite: NEGATIVE
Protein, ur: 30 mg/dL — AB
Specific Gravity, Urine: 1.006 (ref 1.005–1.030)
WBC, UA: >50 WBC/hpf (ref 0–5)
pH: 5 (ref 5.0–8.0)

## 2022-11-02 LAB — CBC
HCT: 37.2 % (ref 36.0–46.0)
Hemoglobin: 12.4 g/dL (ref 12.0–15.0)
MCH: 31.2 pg (ref 26.0–34.0)
MCHC: 33.3 g/dL (ref 30.0–36.0)
MCV: 93.5 fL (ref 80.0–100.0)
Platelets: 236 10*3/uL (ref 150–400)
RBC: 3.98 MIL/uL (ref 3.87–5.11)
RDW: 12.2 % (ref 11.5–15.5)
WBC: 12.2 10*3/uL — ABNORMAL HIGH (ref 4.0–10.5)
nRBC: 0 % (ref 0.0–0.2)

## 2022-11-02 LAB — URINALYSIS, ROUTINE W REFLEX MICROSCOPIC: Nitrite: POSITIVE — AB

## 2022-11-02 LAB — COMPLETE METABOLIC PANEL WITH GFR
AG Ratio: 1.2 (calc) (ref 1.0–2.5)
Alkaline phosphatase (APISO): 102 U/L (ref 37–153)
CO2: 30 mmol/L (ref 20–32)
Potassium: 4.4 mmol/L (ref 3.5–5.3)
Total Protein: 7.5 g/dL (ref 6.1–8.1)

## 2022-11-02 LAB — CBG MONITORING, ED
Glucose-Capillary: 280 mg/dL — ABNORMAL HIGH (ref 70–99)
Glucose-Capillary: 283 mg/dL — ABNORMAL HIGH (ref 70–99)

## 2022-11-02 LAB — HEPATIC FUNCTION PANEL
ALT: 40 U/L (ref 0–44)
AST: 36 U/L (ref 15–41)
Albumin: 3.2 g/dL — ABNORMAL LOW (ref 3.5–5.0)
Alkaline Phosphatase: 72 U/L (ref 38–126)
Bilirubin, Direct: <0.1 mg/dL (ref 0.0–0.2)
Total Bilirubin: 0.5 mg/dL (ref 0.3–1.2)
Total Protein: 6.9 g/dL (ref 6.5–8.1)

## 2022-11-02 LAB — URINE CULTURE

## 2022-11-02 LAB — BETA-HYDROXYBUTYRIC ACID: Beta-Hydroxybutyric Acid: 0.15 mmol/L (ref 0.05–0.27)

## 2022-11-02 LAB — GLUCOSE, CAPILLARY
Glucose-Capillary: 281 mg/dL — ABNORMAL HIGH (ref 70–99)
Glucose-Capillary: 362 mg/dL — ABNORMAL HIGH (ref 70–99)

## 2022-11-02 MED ORDER — ASPIRIN 81 MG PO CHEW
81.0000 mg | CHEWABLE_TABLET | Freq: Every day | ORAL | Status: DC
  Administered 2022-11-02 – 2022-11-03 (×2): 81 mg via ORAL
  Filled 2022-11-02 (×5): qty 1

## 2022-11-02 MED ORDER — SODIUM CHLORIDE 0.9 % IV SOLN
1.0000 g | Freq: Once | INTRAVENOUS | Status: AC
  Administered 2022-11-02: 1 g via INTRAVENOUS

## 2022-11-02 MED ORDER — ENOXAPARIN SODIUM 30 MG/0.3ML IJ SOSY
30.0000 mg | PREFILLED_SYRINGE | INTRAMUSCULAR | Status: DC
  Administered 2022-11-02: 30 mg via SUBCUTANEOUS
  Filled 2022-11-02: qty 0.3

## 2022-11-02 MED ORDER — ACETAMINOPHEN 325 MG PO TABS: 650.0000 mg | ORAL_TABLET | Freq: Four times a day (QID) | ORAL | Status: DC | PRN

## 2022-11-02 MED ORDER — INSULIN GLARGINE-YFGN 100 UNIT/ML ~~LOC~~ SOLN
15.0000 [IU] | Freq: Every day | SUBCUTANEOUS | Status: DC
  Administered 2022-11-02: 15 [IU] via SUBCUTANEOUS
  Filled 2022-11-02 (×2): qty 0.15

## 2022-11-02 MED ORDER — ACETAMINOPHEN 650 MG RE SUPP: 650.0000 mg | Freq: Four times a day (QID) | RECTAL | Status: DC | PRN

## 2022-11-02 MED ORDER — SODIUM CHLORIDE 0.9% FLUSH
3.0000 mL | Freq: Two times a day (BID) | INTRAVENOUS | Status: DC
  Administered 2022-11-02 – 2022-11-03 (×3): 3 mL via INTRAVENOUS

## 2022-11-02 MED ORDER — HYDRALAZINE HCL 20 MG/ML IJ SOLN: 10.0000 mg | INTRAMUSCULAR | Status: DC | PRN

## 2022-11-02 MED ORDER — ENOXAPARIN SODIUM 30 MG/0.3ML IJ SOSY: 30.0000 mg | PREFILLED_SYRINGE | INTRAMUSCULAR | Status: DC

## 2022-11-02 MED ORDER — TRAMADOL HCL 50 MG PO TABS: 50.0000 mg | ORAL_TABLET | Freq: Four times a day (QID) | ORAL | Status: DC | PRN

## 2022-11-02 MED ORDER — SODIUM CHLORIDE 0.9 % IV SOLN: INTRAVENOUS | Status: DC

## 2022-11-02 MED ORDER — ALBUTEROL SULFATE (2.5 MG/3ML) 0.083% IN NEBU: 2.5000 mg | INHALATION_SOLUTION | Freq: Four times a day (QID) | RESPIRATORY_TRACT | Status: DC | PRN

## 2022-11-02 MED ORDER — LACTATED RINGERS IV BOLUS
1000.0000 mL | Freq: Once | INTRAVENOUS | Status: AC
  Administered 2022-11-02: 1000 mL via INTRAVENOUS

## 2022-11-02 MED ORDER — INSULIN ASPART 100 UNIT/ML IJ SOLN
0.0000 [IU] | Freq: Every day | INTRAMUSCULAR | Status: DC
  Administered 2022-11-02: 5 [IU] via SUBCUTANEOUS

## 2022-11-02 MED ORDER — INSULIN ASPART 100 UNIT/ML IJ SOLN
0.0000 [IU] | Freq: Three times a day (TID) | INTRAMUSCULAR | Status: DC
  Administered 2022-11-03: 3 [IU] via SUBCUTANEOUS

## 2022-11-02 MED ORDER — ROSUVASTATIN CALCIUM 20 MG PO TABS
20.0000 mg | ORAL_TABLET | Freq: Every day | ORAL | Status: DC
  Administered 2022-11-02 – 2022-11-03 (×2): 20 mg via ORAL
  Filled 2022-11-02 (×2): qty 1

## 2022-11-02 MED ORDER — SODIUM CHLORIDE 0.9 % IV SOLN
1.0000 g | INTRAVENOUS | Status: DC
  Filled 2022-11-02 (×2): qty 10

## 2022-11-02 MED ORDER — INSULIN ASPART 100 UNIT/ML IJ SOLN: 0.0000 [IU] | Freq: Three times a day (TID) | INTRAMUSCULAR | Status: DC

## 2022-11-02 MED ORDER — ADULT MULTIVITAMIN W/MINERALS CH
1.0000 | ORAL_TABLET | Freq: Every day | ORAL | Status: DC
  Administered 2022-11-03: 1 via ORAL
  Filled 2022-11-02 (×2): qty 1

## 2022-11-02 NOTE — ED Provider Notes (Cosign Needed Addendum)
[place] EMERGENCY DEPARTMENT AT [facility] Provider Note   CSN: [id number] Arrival date & time: 11/02/22  1039     History  Chief Complaint  Patient presents with   Abnormal Lab    [name] is a 89 y.o. female.  With history of CKD, IDDM 2, hypertension, hypercholesterolemia, arthritis, vitamin D deficiency who presents to the ED for evaluation of hyperglycemia and dysuria.  She states she has had pain with urination over the past week.  She also reports frequency and urgency.  She presented to her primary care provider yesterday who obtained labs.  She was called this morning and told that her labs showed evidence of infection and very high blood sugar.  She states she has been using her insulin as prescribed.  Her typical blood sugar has been around 300 lately.  She has not used her insulin today as she came straight here after waking up this morning.  She denies any abdominal pain, nausea, vomiting, diarrhea.  Denies chest pain or shortness of breath or cough.  No fevers or chills.   Abnormal Lab      Home Medications Prior to Admission medications   Medication Sig Start Date End Date Taking? Authorizing Provider  aspirin 81 MG tablet Take 81 mg by mouth daily.   Yes [provider]  bisoprolol-hydrochlorothiazide [facility]) 2.5-6.25 MG tablet Take  1 tablet  Daily  for BP 12/13/21  Yes Lucky Cowboy, MD  Cholecalciferol (VITAMIN D3) 2000 UNITS TABS Take 1 capsule by mouth 2 (two) times daily.   Yes [provider]  ciprofloxacin (CIPRO) 250 MG tablet Take 1 tablet 2 x /day with Food for Infection Patient taking differently: Take 250 mg by mouth 2 (two) times daily. 11/01/22  Yes Cranford, Archie Patten, NP  docusate sodium (COLACE) 100 MG capsule Take 1 capsule twice daily as needed for constipation with a bottle of water. 08/26/21  Yes Judd Gaudier, NP  insulin lispro (HUMALOG) 100 UNIT/ML injection Inject 5-10 Units into the skin 3 (three) times daily  with meals. Per sliding scale 02/27/17  Yes [provider]  linaclotide (LINZESS) 72 MCG capsule Start taking 1 cap daily for constipation. Patient taking differently: Take 72 mcg by mouth daily as needed (For constipation). 10/19/21  Yes Judd Gaudier, NP  meclizine (ANTIVERT) 25 MG tablet Take 0.5-1 tablets (12.5-25 mg total) by mouth 3 (three) times daily as needed for dizziness. 12/10/21  Yes Terrilee Files, MD  Multiple Vitamins-Minerals (CENTRUM SILVER 50+WOMEN) TABS Take 1 tablet by mouth daily.   Yes [provider]  rosuvastatin (CRESTOR) 20 MG tablet Take   1 tablet  Daily for Cholesterol                                                                 /                                                          TAKE  BY                                        MOUTH                                      ONCE ? DAILY Patient taking differently: Take 20 mg by mouth daily. 08/26/22  Yes Lucky Cowboy, MD  traMADol (ULTRAM) 50 MG tablet Take 50 mg by mouth every 6 (six) hours as needed for moderate pain. Pt only takes 2 pills per night   Yes [provider]  Blood Glucose Calibration (ACCU-CHEK AVIVA) SOLN Check blood sugar 3 times daily-DX-E11.22 03/11/15   Lucky Cowboy, MD  Blood Glucose Monitoring Suppl DEVI Use to test blood sugar three times a day 04/06/22   Adela Glimpse, NP  COMFORT EZ PEN NEEDLES 32G X 4 MM MISC USE 2 TIMES DAILY AS DIRECTED , 1 PEN NEEDLE PER INJECTION. 01/14/19   Lucky Cowboy, MD  Continuous Blood Gluc Sensor (FREESTYLE LIBRE SENSOR SYSTEM) MISC Check blood sugar 3 times a day. Dx:E11.8, Z79.4 12/26/19   Judd Gaudier, NP  Continuous Blood Gluc Sensor MISC Check blood sugar 3 times a day- Dx: E11.8/Z79.4 05/09/18   Judd Gaudier, NP  dorzolamide-timolol (COSOPT) 22.3-6.8 MG/ML ophthalmic solution Place 1 drop into both eyes 2 (two) times daily. 03/31/16   [provider]  EASY  COMFORT INSULIN SYRINGE 30G X 1/2" 0.5 ML MISC  10/01/15   [provider]  fluconazole (DIFLUCAN) 150 MG tablet Take 1 tablet (150 mg) at the sign of symptoms.  If not resolved after 72 hours may take additional 150 mg dosage. Patient not taking: Reported on 11/01/2022 09/22/22   Adela Glimpse, NP  glucose blood (ONETOUCH VERIO) test strip USE 1 STRIP TO CHECK GLUCOSE THREE TIMES DAILY AS DIRECTED 05/23/22   Adela Glimpse, NP  Insulin Glargine (BASAGLAR KWIKPEN) 100 UNIT/ML DIAL AND INJECT 30 UNITS UNDER THE SKIN ONCE DAILY AS DIRECTED 02/22/22   Raynelle Dick, NP  ipratropium (ATROVENT) 0.03 % nasal spray Place 2 sprays into the nose 3 (three) times daily. 06/22/21 09/22/22  Raynelle Dick, NP  Lancet Devices (ADJUSTABLE LANCING DEVICE) MISC CHECK BLOOD SUGAR 3 TIMES A DAY 09/07/20   Lucky Cowboy, MD  Lancets MISC Use to test blood sugar three times a day 04/06/22   Adela Glimpse, NP  latanoprost (XALATAN) 0.005 % ophthalmic solution Place 1 drop into both eyes at bedtime. 03/28/16   [provider]      Allergies    Ciprofloxacin and Morphine and codeine    Review of Systems   Review of Systems  Genitourinary:  Positive for dysuria, frequency and urgency.  All other systems reviewed and are negative.   Physical Exam Updated Vital Signs BP (!) 164/56 (BP Location: Right Arm)   Pulse (!) 54   Temp 97.6 F (36.4 C) (Oral)   Resp 16   Ht 5\' 3"  (1.6 m)   Wt 47.6 kg   SpO2 99%   BMI 18.60 kg/m  Physical Exam Vitals and nursing note reviewed.  Constitutional:      General: She is not in acute distress.    Appearance: She is well-developed. She is not ill-appearing, toxic-appearing or diaphoretic.     Comments: Resting comfortably in bed  HENT:  Head: Normocephalic and atraumatic.  Eyes:     Conjunctiva/sclera: Conjunctivae normal.  Cardiovascular:     Rate and Rhythm: Normal rate and regular rhythm.     Heart sounds: No murmur heard. Pulmonary:      Effort: Pulmonary effort is normal. No respiratory distress.     Breath sounds: Normal breath sounds.  Abdominal:     Palpations: Abdomen is soft.     Tenderness: There is no abdominal tenderness. There is no right CVA tenderness, left CVA tenderness or guarding.  Musculoskeletal:        General: No swelling.     Cervical back: Neck supple.  Skin:    General: Skin is warm and dry.     Capillary Refill: Capillary refill takes less than 2 seconds.  Neurological:     General: No focal deficit present.     Mental Status: She is alert and oriented to person, place, and time.  Psychiatric:        Mood and Affect: Mood normal.     ED Results / Procedures / Treatments   Labs (all labs ordered are listed, but only abnormal results are displayed) Labs Reviewed  BASIC METABOLIC PANEL - Abnormal; Notable for the following components:      Result Value   Sodium 134 (*)    Glucose, Bld 288 (*)    BUN 41 (*)    Creatinine, Ser 1.91 (*)    GFR, Estimated 25 (*)    All other components within normal limits  CBC - Abnormal; Notable for the following components:   WBC 12.2 (*)    All other components within normal limits  HEPATIC FUNCTION PANEL - Abnormal; Notable for the following components:   Albumin 3.2 (*)    All other components within normal limits  URINALYSIS, W/ REFLEX TO CULTURE (INFECTION SUSPECTED) - Abnormal; Notable for the following components:   APPearance CLOUDY (*)    Glucose, UA >=500 (*)    Hgb urine dipstick SMALL (*)    Protein, ur 30 (*)    Leukocytes,Ua LARGE (*)    Bacteria, UA FEW (*)    All other components within normal limits  CBC WITH DIFFERENTIAL/PLATELET - Abnormal; Notable for the following components:   WBC 12.2 (*)    Lymphs Abs 4.4 (*)    All other components within normal limits  CBC - Abnormal; Notable for the following components:   WBC 12.6 (*)    All other components within normal limits  BASIC METABOLIC PANEL - Abnormal; Notable for the  following components:   Glucose, Bld 114 (*)    BUN 35 (*)    Creatinine, Ser 1.53 (*)    Calcium 8.7 (*)    GFR, Estimated 32 (*)    All other components within normal limits  HEMOGLOBIN A1C - Abnormal; Notable for the following components:   Hgb A1c MFr Bld 12.6 (*)    All other components within normal limits  GLUCOSE, CAPILLARY - Abnormal; Notable for the following components:   Glucose-Capillary 281 (*)    All other components within normal limits  GLUCOSE, CAPILLARY - Abnormal; Notable for the following components:   Glucose-Capillary 362 (*)    All other components within normal limits  GLUCOSE, CAPILLARY - Abnormal; Notable for the following components:   Glucose-Capillary 188 (*)    All other components within normal limits  CBG MONITORING, ED - Abnormal; Notable for the following components:   Glucose-Capillary 280 (*)    All other  components within normal limits  I-STAT VENOUS BLOOD GAS, ED - Abnormal; Notable for the following components:   pH, Ven 7.434 (*)    pCO2, Ven 38.1 (*)    pO2, Ven 74 (*)    All other components within normal limits  CBG MONITORING, ED - Abnormal; Notable for the following components:   Glucose-Capillary 283 (*)    All other components within normal limits  URINE CULTURE  BETA-HYDROXYBUTYRIC ACID  GLUCOSE, CAPILLARY    EKG EKG Interpretation Date/Time:  Wednesday November 02 2022 10:49:47 EDT Ventricular Rate:  63 PR Interval:  180 QRS Duration:  86 QT Interval:  451 QTC Calculation: 462 R Axis:   78  Text Interpretation: Sinus rhythm Confirmed by Linwood Dibbles [phone]) on 11/02/2022 10:57:31 AM  Radiology No results found.  Procedures Procedures    Medications Ordered in ED Medications  lactated ringers bolus 1,000 mL (0 mLs Intravenous Stopped 11/02/22 1238)  cefTRIAXone (ROCEPHIN) 1 g in sodium chloride 0.9 % 100 mL IVPB (0 g Intravenous Stopped 11/03/22 1318)    ED Course/ Medical Decision Making/ A&P Clinical Course as of  11/04/22 1901  Wed Nov 02, 2022  1417 Spoke with hospitalist Dr. Katrinka Blazing who will admit [AS]    Clinical Course User Index [AS] Lula Olszewski Edsel Petrin, PA-C                             Medical Decision Making Amount and/or Complexity of Data Reviewed Labs: ordered. Radiology: ordered.  Risk Decision regarding hospitalization.  This patient presents to the ED for concern of dysuria, abnormal labs, this involves an extensive number of treatment options, and is a complaint that carries with it a high risk of complications and morbidity.  Differential diagnosis includes cystitis, pyelonephritis.  Differential diagnosis for hyperglycemia include hyperglycemia, DKA, HHS, side effect of infection  Co morbidities that complicate the patient evaluation  CKD, IDDM 2, hypertension, hypercholesterolemia, arthritis, vitamin D deficiency  My initial workup includes labs, imaging, EKG, fluids  Additional history obtained from: Nursing notes from this visit. Previous records within EMR system office visit yesterday showing hyperglycemia of 550, leukocytosis of 23.1, urinalysis with positive nitrites, significant leukocytes and white blood cells, many bacteria Family husband is at bedside and provides a portion of the history  I ordered, reviewed and interpreted labs which include: CMP, CBC, beta hydroxybutyrate, VBG, urinalysis.  Urine with large leukocytes, greater than 50 white blood cells, few bacteria.  Leukocytosis of 12.2 greatly improved from 23.1 yesterday.  Hyponatremia of 234, creatinine elevated at 1.91 over baseline of approximately 1.35  I ordered imaging studies including CXR I independently visualized and interpreted imaging which showed negative I agree with the radiologist interpretation  Cardiac Monitoring:  The patient was maintained on a cardiac monitor.  I personally viewed and interpreted the cardiac monitored which showed an underlying rhythm of: NSR  Consultations  Obtained:  I requested consultation with the hospitalist Dr. Katrinka Blazing,  and discussed lab and imaging findings as well as pertinent plan - they recommend: admission  Afebrile, hemodynamically stable.  89 year old female presenting to the ED for evaluation of abnormal labs.  She was having urinary symptoms over the past week.  Labs yesterday were consistent for an obviously infected urinalysis, significantly elevated white blood cell count, significantly elevated blood glucose.  She was started on ciprofloxacin.  She has taken 1 dose of this medication.  Her labs today are consistent for an improved white  blood cell count and blood sugar.  Her creatinine and BUN are elevated over her baseline.  Given patient's worsening kidney function, urinary tract infection, leukocytosis believe she would benefit from admission to the hospital for continued monitoring.  Questions to the patient is in agreement with this plan.  [name] case discussed with Dr. Lynelle Doctor who agrees with plan to admit.   Note: Portions of this report may have been transcribed using voice recognition software. Every effort was made to ensure accuracy; however, inadvertent computerized transcription errors may still be present.        Final Clinical Impression(s) / ED Diagnoses Final diagnoses:  Cystitis  Elevated serum creatinine    Rx / DC Orders ED Discharge Orders     None         Mora Bellman 11/02/22 1500    Linwood Dibbles, MD 11/03/22 [address], Edsel Petrin, PA-C 11/04/22 1901    Linwood Dibbles, MD 11/06/22 [phone]

## 2022-11-02 NOTE — ED Notes (Signed)
RN assisted pt on BSC  

## 2022-11-02 NOTE — H&P (Signed)
History and Physical    Patient: [name] WGN:[id number] DOB: 1929/07/29 DOA: 11/02/2022 DOS: the patient was seen and examined on 11/02/2022 PCP: Lucky Cowboy, MD  Patient coming from: Home  Chief Complaint:  Chief Complaint  Patient presents with   Abnormal Lab   HPI: [name] is a 89 y.o. female with medical history significant of hypertension, hyperlipidemia, diabetes mellitus type 2, and chronic kidney disease stage IIIb who presents due to abnormal labs.  For the last week she had been having pain with urination, left lower quadrant abdominal pain, and urinary frequency.  She had been seen by her primary care provider yesterday and diagnosed with a urinary tract infection.  Urine cultures had been obtained and she was started on ciprofloxacin.  Patient reported taking 1 dose.  Blood work was also obtained and revealed WBC 23.1, sodium 134, CO2 30, BUN 39, creatinine 1.74, and glucose 550.  She was advised to come to the hospital for further evaluation.  [name] son makes note of patient having urinary tract infection couple months ago for which she had been given ciprofloxacin and taking the medication for about a week, but dizziness for which medication was stopped. She normally does not take insulin every day unless her blood sugars are elevated.   In the emergency department patient was noted to be afebrile with heart rates 51-69, and all other vital signs noted to be labile.  Labs noted WBC 12.2, BUN 41, creatinine 1.91.  Urinalysis significant for large leukocytes, few bacteria, greater than 50 WBCs.  Urine cultures have been obtained.  Patient was given 1 L of lactated Ringer's and started on Rocephin IV.  Review of Systems: As mentioned in the history of present illness. All other systems reviewed and are negative. Past Medical History:  Diagnosis Date   Arthritis    Chronic back pain    Cystoid macular edema of right eye 08/19/2019   Diabetic neuropathy (HCC)     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) 11/18/2019   Hypercholesteremia    Hypertension    Liver lesion    Neuropathy    Type II or unspecified type diabetes mellitus without mention of complication, not stated as uncontrolled    Vitamin D deficiency    Wears glasses    Past Surgical History:  Procedure Laterality Date   ABDOMINAL HYSTERECTOMY     APPENDECTOMY     BACK SURGERY  1993   lumb lam-fusion   BREAST EXCISIONAL BIOPSY Right 1967   benign   CESAREAN SECTION     x 3    COLONOSCOPY     EYE SURGERY     both cataracts   TRIGGER FINGER RELEASE  01/10/2012   Procedure: RELEASE TRIGGER FINGER/A-1 PULLEY;  Surgeon: Velna Ochs, MD;  Location: [facility];  Service: Orthopedics;  Laterality: Right;  right ring trigger release   Social History:  reports that she has never smoked. She has never used smokeless tobacco. She reports that she does not drink alcohol and does not use drugs.  Allergies  Allergen Reactions   Morphine And Codeine Other (See Comments)    fuzzy feeling    Family History  Problem Relation Age of Onset   Diabetes Sister    Liver disease Sister    Liver cancer Sister    Lymphoma Sister    Stroke Brother    Diabetes Brother    Kidney cancer Brother  Colon cancer Neg Hx     Prior to Admission medications   Medication Sig Start Date End Date Taking? Authorizing Provider  aspirin 81 MG tablet Take 81 mg by mouth daily.    [provider]  bisoprolol-hydrochlorothiazide [facility]) 2.5-6.25 MG tablet Take  1 tablet  Daily  for BP 12/13/21   Lucky Cowboy, MD  Blood Glucose Calibration (ACCU-CHEK AVIVA) SOLN Check blood sugar 3 times daily-DX-E11.22 03/11/15   Lucky Cowboy, MD  Blood Glucose Monitoring Suppl DEVI Use to test blood sugar three times a day 04/06/22   Adela Glimpse, NP  buPROPion (WELLBUTRIN XL) 150 MG 24 hr tablet Take 1 tablet every Morning  for Mood , Focus & Concentration 03/28/19   Lucky Cowboy, MD  busPIRone (BUSPAR) 5 MG tablet Take 1 tab three times a day as needed for mood/anxiety. 08/31/21   Judd Gaudier, NP  Cholecalciferol (VITAMIN D3) 2000 UNITS TABS Take 1 capsule by mouth 2 (two) times daily.    [provider]  ciprofloxacin (CIPRO) 250 MG tablet Take 1 tablet 2 x /day with Food for Infection 11/01/22   Cranford, Archie Patten, NP  COMFORT EZ PEN NEEDLES 32G X 4 MM MISC USE 2 TIMES DAILY AS DIRECTED , 1 PEN NEEDLE PER INJECTION. 01/14/19   Lucky Cowboy, MD  Continuous Blood Gluc Sensor (FREESTYLE LIBRE SENSOR SYSTEM) MISC Check blood sugar 3 times a day. Dx:E11.8, Z79.4 12/26/19   Judd Gaudier, NP  Continuous Blood Gluc Sensor MISC Check blood sugar 3 times a day- Dx: E11.8/Z79.4 05/09/18   Judd Gaudier, NP  docusate sodium (COLACE) 100 MG capsule Take 1 capsule twice daily as needed for constipation with a bottle of water. 08/26/21   Judd Gaudier, NP  dorzolamide-timolol (COSOPT) 22.3-6.8 MG/ML ophthalmic solution Place 1 drop into both eyes 2 (two) times daily. 03/31/16   [provider]  DULoxetine (CYMBALTA) 20 MG capsule Take 1 cap daily for mood and chronic pain. 10/19/21   Judd Gaudier, NP  EASY COMFORT INSULIN SYRINGE 30G X 1/2" 0.5 ML MISC  10/01/15   [provider]  fluconazole (DIFLUCAN) 150 MG tablet Take 1 tablet (150 mg) at the sign of symptoms.  If not resolved after 72 hours may take additional 150 mg dosage. Patient not taking: Reported on 11/01/2022 09/22/22   Adela Glimpse, NP  glucose blood (ONETOUCH VERIO) test strip USE 1 STRIP TO CHECK GLUCOSE THREE TIMES DAILY AS DIRECTED 05/23/22   Adela Glimpse, NP  Insulin Glargine (BASAGLAR KWIKPEN) 100 UNIT/ML DIAL AND INJECT 30 UNITS UNDER THE SKIN ONCE DAILY AS DIRECTED 02/22/22   Raynelle Dick, NP  ipratropium (ATROVENT) 0.03 % nasal spray Place 2 sprays into the nose 3 (three) times daily. 06/22/21 09/22/22  Raynelle Dick, NP   Lancet Devices (ADJUSTABLE LANCING DEVICE) MISC CHECK BLOOD SUGAR 3 TIMES A DAY 09/07/20   Lucky Cowboy, MD  Lancets MISC Use to test blood sugar three times a day 04/06/22   Adela Glimpse, NP  latanoprost (XALATAN) 0.005 % ophthalmic solution Place 1 drop into both eyes at bedtime. 03/28/16   [provider]  linaclotide Karlene Einstein) 72 MCG capsule Start taking 1 cap daily for constipation. 10/19/21   Judd Gaudier, NP  meclizine (ANTIVERT) 25 MG tablet Take 0.5-1 tablets (12.5-25 mg total) by mouth 3 (three) times daily as needed for dizziness. 12/10/21   Terrilee Files, MD  MELATONIN PO Take by mouth.    [provider]  methocarbamol (ROBAXIN) 500 MG tablet Take  1 tablet (500 mg total) by mouth 4 (four) times daily. 01/31/22   Raynelle Dick, NP  Multiple Vitamins-Minerals (CENTRUM SILVER 50+WOMEN) TABS Take 1 tablet by mouth daily.    [provider]  Multiple Vitamins-Minerals (ICAPS AREDS 2 PO) Take 1 capsule by mouth daily.    [provider]  rOPINIRole (REQUIP) 3 MG tablet Take 1 to 2 tablets  at Bedtime  for Restless Legs                                                               /                     TAKE  BY MOUTH 04/21/21   Lucky Cowboy, MD  rosuvastatin (CRESTOR) 20 MG tablet Take   1 tablet  Daily for Cholesterol                                                                 /                                                          TAKE                                              BY                                        MOUTH                                      ONCE ? DAILY 08/26/22   Lucky Cowboy, MD  traMADol (ULTRAM) 50 MG tablet Take 50 mg by mouth every 6 (six) hours as needed for moderate pain. Pt only takes 2 pills per night    [provider]  triamcinolone (KENALOG) 0.025 % ointment Apply 1 Application topically 2 (two) times daily. 09/22/22   Adela Glimpse, NP  vitamin B-12 (CYANOCOBALAMIN) 1000 MCG tablet Take  1,000 mcg by mouth daily.    [provider]    Physical Exam: Vitals:   11/02/22 1046 11/02/22 1047 11/02/22 1200 11/02/22 1230  BP:  (!) 125/103 125/82 128/66  Pulse:  62 (!) 51 (!) 52  Resp:  16 12 12   Temp:  97.9 F (36.6 C)    TempSrc:  Oral    SpO2:  96% 100% 100%  Weight: 46.6 kg     Height: 5\' 3"  (1.6 m)       Constitutional: Elderly female who appears to be in no acute distress at this  time. Eyes: PERRL, lids and conjunctivae normal ENMT: Mucous membranes are moist.   Neck: normal, supple  Respiratory: clear to auscultation bilaterally, no wheezing, no crackles. Normal respiratory effort. No accessory muscle use.  Cardiovascular: Regular rate and rhythm, no murmurs / rubs / gallops. No extremity edema. 2+ pedal pulses. No carotid bruits.  Abdomen: no tenderness, no masses palpated. No hepatosplenomegaly. Bowel sounds positive.  Musculoskeletal: no clubbing / cyanosis. No joint deformity upper and lower extremities. Good ROM, no contractures. Normal muscle tone.  Skin: Poor skin turgor. Neurologic: CN 2-12 grossly intact.  . Strength 5/5 in all 4.  Psychiatric: Alert and oriented x 3, but patient does have some memory issues as she does repeat questions such as where did her husband go despite son telling her where he was multiple times  Data Reviewed:   Reviewed labs, imaging, and pertinent records as noted above in HPI.  Assessment and Plan:  Urinary tract infection Acute.  Patient reported having urinary frequency and dysuria over the last week.  She had been started on ciprofloxacin but only taken 1-2 dose.  Urinalysis noted small hemoglobin, large leukocytes, -Admit to MedSurg bed -Follow-up urine cultures -Rocephin IV  Acute kidney injury superimposed on chronic kidney disease stage IIIb Patient presents with creatinine elevated up to 1.91 with BUN 41.  Baseline creatinine previously noted to be around 1.3-1.6.  Home blood pressure regimen includes  hydrochlorothiazide which could be contributing to symptoms. -Hold possible nephrotoxic agents -Continue normal saline IV fluids at 75 mL/h -Recheck kidney function in a.m.  Diabetes mellitus type 2 with hyperglycemia, with long-term use of insulin Prior to arrival glucose had been noted to be elevated in the 500s.  On admission glucose 288.  Patient reports not taking insulin on a daily basis and only as needed.  Unclear if she was on long-acting insulin daily basis. -Hypoglycemic protocols -Glargine changed to 15 units daily as it is unclear if she was actually taking 30 units as prescribed. -CBGs before every meal with moderate SSI -Adjust insulin regimen as needed  Essential hypertension Initial blood pressures 108/64 - 128/66.  Home blood pressure regimen includes bisoprolol-hydrochlorothiazide. -Hold home blood pressure regimen  Hyperlipidemia associated with diabetes mellitus type  -Continue Crestor  Gait disturbance At baseline patient walks with the use of a cane. -Physical therapy to evaluate and treat    DVT prophylaxis: Lovenox Advance Care Planning:   Code Status: Full Code   Consults: None  Family Communication: Son updated at bedside  Severity of Illness: The appropriate patient status for this patient is INPATIENT. Inpatient status is judged to be reasonable and necessary in order to provide the required intensity of service to ensure the patient's safety. The patient's presenting symptoms, physical exam findings, and initial radiographic and laboratory data in the context of their chronic comorbidities is felt to place them at high risk for further clinical deterioration. Furthermore, it is not anticipated that the patient will be medically stable for discharge from the hospital within 2 midnights of admission.   * I certify that at the point of admission it is my clinical judgment that the patient will require inpatient hospital care spanning beyond 2 midnights  from the point of admission due to high intensity of service, high risk for further deterioration and high frequency of surveillance required.*  Author: Clydie Braun, MD 11/02/2022 2:08 PM  For on call review [email or web address].

## 2022-11-02 NOTE — ED Notes (Signed)
RN placed BSC at bedside

## 2022-11-02 NOTE — ED Notes (Signed)
ED TO INPATIENT HANDOFF REPORT  ED Nurse Name and Phone #: Jess Barters [phone]  S Name/Age/Gender [name] 89 y.o. female Room/Bed: 016C/016C  Code Status   Code Status: Full Code  Home/SNF/Other Home Patient oriented to: self, place, time, and situation Is this baseline? Yes   Triage Complete: Triage complete  Chief Complaint UTI (urinary tract infection) [N39.0]  Triage Note Pt was sent by PCP for elevated blood glucose of 550. Pt denies any complaints.   Allergies Allergies  Allergen Reactions   Morphine And Codeine Other (See Comments)    fuzzy feeling    Level of Care/Admitting Diagnosis ED Disposition     ED Disposition  Admit   Condition  --   Comment  Hospital Area: [facility] [[id number]]  Level of Care: Med-Surg [16]  May admit patient to [facility] or [facility] if equivalent level of care is available:: No  Covid Evaluation: Asymptomatic - no recent exposure (last 10 days) testing not required  Diagnosis: UTI (urinary tract infection) [063016]  Admitting Physician: Clydie Braun [[id number]]  Attending Physician: Clydie Braun [[id number]]  Certification:: I certify this patient will need inpatient services for at least 2 midnights  Estimated Length of Stay: 2          B Medical/Surgery History Past Medical History:  Diagnosis Date   Arthritis    Chronic back pain    Cystoid macular edema of right eye 08/19/2019   Diabetic neuropathy (HCC)    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) 11/18/2019   Hypercholesteremia    Hypertension    Liver lesion    Neuropathy    Type II or unspecified type diabetes mellitus without mention of complication, not stated as uncontrolled    Vitamin D deficiency    Wears glasses    Past Surgical History:  Procedure Laterality Date   ABDOMINAL HYSTERECTOMY     APPENDECTOMY     BACK SURGERY  1993   lumb lam-fusion    BREAST EXCISIONAL BIOPSY Right 1967   benign   CESAREAN SECTION     x 3    COLONOSCOPY     EYE SURGERY     both cataracts   TRIGGER FINGER RELEASE  01/10/2012   Procedure: RELEASE TRIGGER FINGER/A-1 PULLEY;  Surgeon: Velna Ochs, MD;  Location: [facility];  Service: Orthopedics;  Laterality: Right;  right ring trigger release     A IV Location/Drains/Wounds Patient Lines/Drains/Airways Status     Active Line/Drains/Airways     Name Placement date Placement time Site Days   Peripheral IV 11/02/22 20 G Anterior;Proximal;Right Forearm 11/02/22  1142  Forearm  less than 1   Incision 01/10/12 Hand Right 01/10/12  1025  -- 3949            Intake/Output Last 24 hours No intake or output data in the 24 hours ending 11/02/22 1503  Labs/Imaging Results for orders placed or performed during the hospital encounter of 11/02/22 (from the past 48 hour(s))  CBG monitoring, ED     Status: Abnormal   Collection Time: 11/02/22 10:56 AM  Result Value Ref Range   Glucose-Capillary 280 (H) 70 - 99 mg/dL    Comment: Glucose reference range applies only to samples taken after fasting for at least 8 hours.  Basic metabolic panel     Status: Abnormal   Collection Time: 11/02/22 11:14 AM  Result Value Ref Range  Sodium 134 (L) 135 - 145 mmol/L   Potassium 3.9 3.5 - 5.1 mmol/L   Chloride 98 98 - 111 mmol/L   CO2 24 22 - 32 mmol/L   Glucose, Bld 288 (H) 70 - 99 mg/dL    Comment: Glucose reference range applies only to samples taken after fasting for at least 8 hours.   BUN 41 (H) 8 - 23 mg/dL   Creatinine, Ser 1.61 (H) 0.44 - 1.00 mg/dL   Calcium 9.2 8.9 - 09.6 mg/dL   GFR, Estimated 25 (L) >60 mL/min    Comment: (NOTE) Calculated using the CKD-EPI Creatinine Equation (2021)    Anion gap 12 5 - 15    Comment: Performed at [facility], [address] [place], [place] [address]  CBC     Status: Abnormal   Collection Time: 11/02/22 11:14 AM  Result Value Ref Range    WBC 12.2 (H) 4.0 - 10.5 K/uL   RBC 3.98 3.87 - 5.11 MIL/uL   Hemoglobin 12.4 12.0 - 15.0 g/dL   HCT 98.1 19.1 - 47.8 %   MCV 93.5 80.0 - 100.0 fL   MCH 31.2 26.0 - 34.0 pg   MCHC 33.3 30.0 - 36.0 g/dL   RDW 29.5 62.1 - 30.8 %   Platelets 236 150 - 400 K/uL   nRBC 0.0 0.0 - 0.2 %    Comment: Performed at [facility], [address], [place], [place] [address]  Hepatic function panel     Status: Abnormal   Collection Time: 11/02/22 11:14 AM  Result Value Ref Range   Total Protein 6.9 6.5 - 8.1 g/dL   Albumin 3.2 (L) 3.5 - 5.0 g/dL   AST 36 15 - 41 U/L   ALT 40 0 - 44 U/L   Alkaline Phosphatase 72 38 - 126 U/L   Total Bilirubin 0.5 0.3 - 1.2 mg/dL   Bilirubin, Direct <6.9 0.0 - 0.2 mg/dL   Indirect Bilirubin NOT CALCULATED 0.3 - 0.9 mg/dL    Comment: Performed at [facility], [address] [place], [place] [address]  CBC with Differential/Platelet     Status: Abnormal   Collection Time: 11/02/22 11:14 AM  Result Value Ref Range   WBC 12.2 (H) 4.0 - 10.5 K/uL   RBC 3.98 3.87 - 5.11 MIL/uL   Hemoglobin 12.6 12.0 - 15.0 g/dL   HCT 84.1 32.4 - 40.1 %   MCV 94.2 80.0 - 100.0 fL   MCH 31.7 26.0 - 34.0 pg   MCHC 33.6 30.0 - 36.0 g/dL   RDW 02.7 25.3 - 66.4 %   Platelets 243 150 - 400 K/uL   nRBC 0.0 0.0 - 0.2 %   Neutrophils Relative % 53 %   Neutro Abs 6.5 1.7 - 7.7 K/uL   Lymphocytes Relative 36 %   Lymphs Abs 4.4 (H) 0.7 - 4.0 K/uL   Monocytes Relative 7 %   Monocytes Absolute 0.8 0.1 - 1.0 K/uL   Eosinophils Relative 3 %   Eosinophils Absolute 0.4 0.0 - 0.5 K/uL   Basophils Relative 1 %   Basophils Absolute 0.1 0.0 - 0.1 K/uL   Immature Granulocytes 0 %   Abs Immature Granulocytes 0.03 0.00 - 0.07 K/uL    Comment: Performed at [facility], [address], [place], [place] [address]  Beta-hydroxybutyric acid     Status: None   Collection Time: 11/02/22 11:16 AM  Result Value Ref Range   Beta-Hydroxybutyric Acid  0.15 0.05 - 0.27 mmol/L     Comment: Performed at [facility], [address] [place], [place] [address]  I-Stat venous blood gas, [facility], [facility], [facility])     Status: Abnormal   Collection Time: 11/02/22 11:25 AM  Result Value Ref Range   pH, Ven 7.434 (H) 7.25 - 7.43   pCO2, Ven 38.1 (L) 44 - 60 mmHg   pO2, Ven 74 (H) 32 - 45 mmHg   Bicarbonate 25.5 20.0 - 28.0 mmol/L   TCO2 27 22 - 32 mmol/L   O2 Saturation 95 %   Acid-Base Excess 1.0 0.0 - 2.0 mmol/L   Sodium 135 135 - 145 mmol/L   Potassium 3.9 3.5 - 5.1 mmol/L   Calcium, Ion 1.18 1.15 - 1.40 mmol/L   HCT 38.0 36.0 - 46.0 %   Hemoglobin 12.9 12.0 - 15.0 g/dL   Sample type VENOUS   Urinalysis, w/ Reflex to Culture (Infection Suspected) -Urine, Clean Catch     Status: Abnormal   Collection Time: 11/02/22  1:05 PM  Result Value Ref Range   Specimen Source URINE, CLEAN CATCH    Color, Urine YELLOW YELLOW   APPearance CLOUDY (A) CLEAR   Specific Gravity, Urine 1.006 1.005 - 1.030   pH 5.0 5.0 - 8.0   Glucose, UA >=500 (A) NEGATIVE mg/dL   Hgb urine dipstick SMALL (A) NEGATIVE   Bilirubin Urine NEGATIVE NEGATIVE   Ketones, ur NEGATIVE NEGATIVE mg/dL   Protein, ur 30 (A) NEGATIVE mg/dL   Nitrite NEGATIVE NEGATIVE   Leukocytes,Ua LARGE (A) NEGATIVE   RBC / HPF 0-5 0 - 5 RBC/hpf   WBC, UA >50 0 - 5 WBC/hpf    Comment:        Reflex urine culture not performed if WBC <=10, OR if Squamous epithelial cells >5. If Squamous epithelial cells >5 suggest recollection.    Bacteria, UA FEW (A) NONE SEEN   Squamous Epithelial / HPF 0-5 0 - 5 /HPF   WBC Clumps PRESENT    Mucus PRESENT    Hyaline Casts, UA PRESENT     Comment: Performed at [facility], [address], [place], [place] [address]  CBG monitoring, ED     Status: Abnormal   Collection Time: 11/02/22  2:40 PM  Result Value Ref Range   Glucose-Capillary 283 (H) 70 - 99 mg/dL    Comment: Glucose reference range applies only to samples taken after fasting for at least 8 hours.   DG Chest Port  1 View  Result Date: 11/02/2022 CLINICAL DATA:  Leukocytosis EXAM: PORTABLE CHEST 1 VIEW COMPARISON:  Chest x-ray 12/21/2021. FINDINGS: The heart size and mediastinal contours are within normal limits. Both lungs are clear. No visible pleural effusions or pneumothorax. No acute osseous abnormality. IMPRESSION: No active disease. Electronically Signed   By: Feliberto Harts M.D.   On: 11/02/2022 11:47    Pending Labs Unresulted Labs (From admission, onward)     Start     Ordered   11/03/22 0500  CBC  Tomorrow morning,   R        11/02/22 1433   11/03/22 0500  Basic metabolic panel  Tomorrow morning,   R        11/02/22 1433   11/02/22 1305  Urine Culture  Once,   R        11/02/22 1305            Vitals/Pain Today's Vitals   11/02/22 1220  11/02/22 1230 11/02/22 1327 11/02/22 1430  BP:  128/66  (!) 110/51  Pulse:  (!) 52  (!) 55  Resp:  12  13  Temp:      TempSrc:      SpO2:  100%  96%  Weight:      Height:      PainSc: 0-No pain  0-No pain     Isolation Precautions No active isolations  Medications Medications  cefTRIAXone (ROCEPHIN) 1 g in sodium chloride 0.9 % 100 mL IVPB (has no administration in time range)  sodium chloride flush (NS) 0.9 % injection 3 mL (has no administration in time range)  acetaminophen (TYLENOL) tablet 650 mg (has no administration in time range)    Or  acetaminophen (TYLENOL) suppository 650 mg (has no administration in time range)  albuterol (PROVENTIL) (2.5 MG/3ML) 0.083% nebulizer solution 2.5 mg (has no administration in time range)  enoxaparin (LOVENOX) injection 30 mg (has no administration in time range)  insulin aspart (novoLOG) injection 0-9 Units (has no administration in time range)  cefTRIAXone (ROCEPHIN) 1 g in sodium chloride 0.9 % 100 mL IVPB (has no administration in time range)  lactated ringers bolus 1,000 mL (0 mLs Intravenous Stopped 11/02/22 1238)    Mobility walks with device walker     Focused  Assessments    R Recommendations: See Admitting Provider Note  Report given to:   Additional Notes:

## 2022-11-02 NOTE — ED Notes (Signed)
Spoke with Delice Bison in lab, to rerun for CBC with Differential.

## 2022-11-02 NOTE — ED Triage Notes (Signed)
Pt was sent by PCP for elevated blood glucose of 550. Pt denies any complaints.

## 2022-11-03 DIAGNOSIS — N3000 Acute cystitis without hematuria: Secondary | ICD-10-CM | POA: Diagnosis not present

## 2022-11-03 LAB — BASIC METABOLIC PANEL
Anion gap: 6 (ref 5–15)
BUN: 35 mg/dL — ABNORMAL HIGH (ref 8–23)
CO2: 27 mmol/L (ref 22–32)
Calcium: 8.7 mg/dL — ABNORMAL LOW (ref 8.9–10.3)
Chloride: 105 mmol/L (ref 98–111)
Creatinine, Ser: 1.53 mg/dL — ABNORMAL HIGH (ref 0.44–1.00)
GFR, Estimated: 32 mL/min — ABNORMAL LOW (ref >60)
Glucose, Bld: 114 mg/dL — ABNORMAL HIGH (ref 70–99)
Potassium: 3.8 mmol/L (ref 3.5–5.1)
Sodium: 138 mmol/L (ref 135–145)

## 2022-11-03 LAB — CBC
HCT: 37.4 % (ref 36.0–46.0)
Hemoglobin: 12.4 g/dL (ref 12.0–15.0)
MCH: 31.7 pg (ref 26.0–34.0)
MCHC: 33.2 g/dL (ref 30.0–36.0)
MCV: 95.7 fL (ref 80.0–100.0)
Platelets: 224 10*3/uL (ref 150–400)
RBC: 3.91 MIL/uL (ref 3.87–5.11)
RDW: 12.4 % (ref 11.5–15.5)
WBC: 12.6 10*3/uL — ABNORMAL HIGH (ref 4.0–10.5)
nRBC: 0 % (ref 0.0–0.2)

## 2022-11-03 LAB — URINE CULTURE
Culture: NO GROWTH
MICRO NUMBER:: 15177891
SPECIMEN QUALITY:: ADEQUATE

## 2022-11-03 LAB — URINALYSIS, ROUTINE W REFLEX MICROSCOPIC
Bilirubin Urine: NEGATIVE
Hyaline Cast: NONE SEEN /LPF
Ketones, ur: NEGATIVE
Specific Gravity, Urine: 1.023 (ref 1.001–1.035)
pH: 5.5 (ref 5.0–8.0)

## 2022-11-03 LAB — CBC WITH DIFFERENTIAL/PLATELET
Absolute Monocytes: 1340 cells/uL — ABNORMAL HIGH (ref 200–950)
Basophils Absolute: 92 cells/uL (ref 0–200)
Eosinophils Absolute: 300 cells/uL (ref 15–500)
Hemoglobin: 14 g/dL (ref 11.7–15.5)
MCH: 32.2 pg (ref 27.0–33.0)
MCHC: 32.9 g/dL (ref 32.0–36.0)
MCV: 97.9 fL (ref 80.0–100.0)
Neutrophils Relative %: 78.5 %
RDW: 11.4 % (ref 11.0–15.0)

## 2022-11-03 LAB — COMPLETE METABOLIC PANEL WITH GFR
ALT: 49 U/L — ABNORMAL HIGH (ref 6–29)
AST: 60 U/L — ABNORMAL HIGH (ref 10–35)
Albumin: 4.1 g/dL (ref 3.6–5.1)
BUN/Creatinine Ratio: 22 (calc) (ref 6–22)
BUN: 39 mg/dL — ABNORMAL HIGH (ref 7–25)
Calcium: 10 mg/dL (ref 8.6–10.4)
Chloride: 93 mmol/L — ABNORMAL LOW (ref 98–110)
Creat: 1.74 mg/dL — ABNORMAL HIGH (ref 0.60–0.95)
Globulin: 3.4 g/dL (calc) (ref 1.9–3.7)
Glucose, Bld: 550 mg/dL (ref 65–99)
Sodium: 134 mmol/L — ABNORMAL LOW (ref 135–146)
Total Bilirubin: 0.5 mg/dL (ref 0.2–1.2)
eGFR: 28 mL/min/{1.73_m2} — ABNORMAL LOW (ref >=60)

## 2022-11-03 LAB — GLUCOSE, CAPILLARY
Glucose-Capillary: 98 mg/dL (ref 70–99)
Glucose-Capillary: 188 mg/dL — ABNORMAL HIGH (ref 70–99)

## 2022-11-03 LAB — MICROSCOPIC MESSAGE

## 2022-11-03 NOTE — Plan of Care (Signed)

## 2022-11-03 NOTE — TOC Transition Note (Signed)
Transition of Care [facility]) - CM/SW Discharge Note   Patient Details  Name: [name] MRN: [id number] Date of Birth: 1929/12/20  [facility] CM/SW Contact:  Janae Bridgeman, RN Phone Number: 11/03/2022, 1:11 PM   Clinical Narrative:    CM met with the patient prior to discharge to home.  Patient was admitted with UTI and plans to return home with her husband today.  Medicare Observation letter and Code 44 provided to the bedside.    Bedside nursing will discharge the patient home today.  Patient was informed to call her PCP office and follow up in the next week - patient was agreeable.   Final next level of care: Home/Self Care Barriers to Discharge: No Barriers Identified   Patient Goals and CMS Choice CMS Medicare.gov Compare Post Acute Care list provided to:: Patient Choice offered to / list presented to : Patient  Discharge Placement                         Discharge Plan and Services Additional resources added to the After Visit Summary for     Discharge Planning Services: CM Consult Post Acute Care Choice: Resumption of Svcs/PTA Provider                               Social Determinants of Health (SDOH) Interventions SDOH Screenings   Food Insecurity: No Food Insecurity (11/02/2022)  Housing: Low Risk  (11/02/2022)  Transportation Needs: No Transportation Needs (11/02/2022)  Utilities: Not At Risk (11/02/2022)  Depression (PHQ2-9): Low Risk  (09/22/2022)  Tobacco Use: Low Risk  (11/02/2022)     Readmission Risk Interventions    11/03/2022    1:11 PM  Readmission Risk Prevention Plan  Post Dischage Appt Complete  Medication Screening Complete  Transportation Screening Complete

## 2022-11-03 NOTE — Evaluation (Signed)
Physical Therapy Evaluation/ Discharge Patient Details Name: DAVETTA OLLIFF MRN: [id number] DOB: [birth year] Today's Date: 11/03/2022  History of Present Illness  89 y.o. female admitted 7/10 with hyperglycemia, dysuria, weakness. Pt with UTI. PMhx: CKD, IDDM 2, HTN, HLD, arthritis, vitamin D deficiency, depression, neuropathy  Clinical Impression  Pt pleasant and reports living at home with her spouse who does most of the cooking and the driving. Pt typically walks with a cane and does not walk outside much for fear of falling. Pt able to walk long hall distance and complete stairs with assist of RW and encouraged continued use of rollator at home for all mobility to improve balance and activity tolerance. Pt also educated for need to increase walking program for overall function. Pt reports understanding and is at baseline functional status without further need for acute therapy. Will sign off and encouraged continued gait and OOB to chair during stay.         Assistance Recommended at Discharge Intermittent Supervision/Assistance  If plan is discharge home, recommend the following:  Can travel by private vehicle  A little help with bathing/dressing/bathroom;Direct supervision/assist for medications management;Assist for transportation        Equipment Recommendations None recommended by PT  Recommendations for Other Services       Functional Status Assessment Patient has not had a recent decline in their functional status     Precautions / Restrictions Precautions Precautions: Fall      Mobility  Bed Mobility Overal bed mobility: Modified Independent                  Transfers Overall transfer level: Modified independent                      Ambulation/Gait Ambulation/Gait assistance: Supervision Gait Distance (Feet): 400 Feet Assistive device: Rolling walker (2 wheels), Straight cane Gait Pattern/deviations: Step-through pattern, Decreased stride  length   Gait velocity interpretation: >2.62 ft/sec, indicative of community ambulatory   General Gait Details: pt initially walking in room with cane 15' but with increased hesitancy and happy to reach out for support. Pt then transitioned to RW for long hall ambulation with improved stability, balance and activity tolerance. pt encouraged to continue RW use  Stairs Stairs: Yes Stairs assistance: Supervision Stair Management: Alternating pattern, Forwards, One rail Right Number of Stairs: 2 General stair comments: pt with reliance on rail and able to perform safely  Wheelchair Mobility     Tilt Bed    Modified Rankin (Stroke Patients Only)       Balance Overall balance assessment: History of Falls, Needs assistance Sitting-balance support: No upper extremity supported, Feet supported Sitting balance-Leahy Scale: Good     Standing balance support: Single extremity supported Standing balance-Leahy Scale: Fair Standing balance comment: pt able to static stand at the sink but benefits from UB support on RW with gait                             Pertinent Vitals/Pain Pain Assessment Pain Assessment: No/denies pain    Home Living Family/patient expects to be discharged to:: Private residence Living Arrangements: Spouse/significant other Available Help at Discharge: Family;Available 24 hours/day Type of Home: House Home Access: Stairs to enter   [employer] of Steps: 2   Home Layout: One level Home Equipment: Agricultural consultant (2 wheels);Wheelchair - manual;Cane - single point      Prior Function Prior Level of Function :  Independent/Modified Independent             Mobility Comments: walks with cane ADLs Comments: spouse prepares breakfast, doesn't drive     Hand Dominance        Extremity/Trunk Assessment   Upper Extremity Assessment Upper Extremity Assessment: Generalized weakness    Lower Extremity Assessment Lower Extremity  Assessment: Generalized weakness    Cervical / Trunk Assessment Cervical / Trunk Assessment: Normal  Communication   Communication: No difficulties  Cognition Arousal/Alertness: Awake/alert Behavior During Therapy: WFL for tasks assessed/performed Overall Cognitive Status: Within Functional Limits for tasks assessed                                          General Comments      Exercises     Assessment/Plan    PT Assessment Patient does not need any further PT services  PT Problem List         PT Treatment Interventions      PT Goals (Current goals can be found in the Care Plan section)  Acute Rehab PT Goals PT Goal Formulation: All assessment and education complete, DC therapy    Frequency       Co-evaluation               AM-PAC PT "6 Clicks" Mobility  Outcome Measure Help needed turning from your back to your side while in a flat bed without using bedrails?: None Help needed moving from lying on your back to sitting on the side of a flat bed without using bedrails?: None Help needed moving to and from a bed to a chair (including a wheelchair)?: A Little Help needed standing up from a chair using your arms (e.g., wheelchair or bedside chair)?: A Little Help needed to walk in hospital room?: A Little Help needed climbing 3-5 steps with a railing? : A Little 6 Click Score: 20    End of Session   Activity Tolerance: Patient tolerated treatment well Patient left: in chair;with call bell/phone within reach;with chair alarm set Nurse Communication: Mobility status PT Visit Diagnosis: Other abnormalities of gait and mobility (R26.89)    Time: 0930-1001 PT Time Calculation (min) (ACUTE ONLY): 31 min   Charges:   PT Evaluation $PT Eval Moderate Complexity: 1 Mod   PT General Charges $$ ACUTE PT VISIT: 1 Visit         Merryl Hacker, PT Acute Rehabilitation Services Office: [phone]   Enedina Finner Clare Casto 11/03/2022, 11:21 AM

## 2022-11-03 NOTE — Inpatient Diabetes Management (Signed)
Inpatient Diabetes Program Recommendations  AACE/ADA: New Consensus Statement on Inpatient Glycemic Control (2015)  Target Ranges:  Prepandial:   less than 140 mg/dL      Peak postprandial:   less than 180 mg/dL (1-2 hours)      Critically ill patients:  140 - 180 mg/dL   Lab Results  Component Value Date   GLUCAP 188 (H) 11/03/2022   HGBA1C CANCELED 09/22/2022    Review of Glycemic Control  Diabetes history: type 2 Outpatient Diabetes medications: Basaglar 30 units daily, Humalog 5-10 units sliding scale TID Current orders for Inpatient glycemic control: Semglee 15 units daily, Novolog 0-15 units correction scale TID, Novolog 0-5 units HS scale  Inpatient Diabetes Program Recommendations:   Spoke with patient at the bedside. Patient reviewed with me the foods that she and her husband eat. Discussed insulin dosages, insulin pen. States that her husband is the one that doses her insulin pen and then she gives it to herself. Spoke with the husband when he arrived to the room. He seemed very knowledgeable on what insulin she takes. He was concerned about finding his son to help them with being discharged.   Reminded her to see her PCP after discharge. She was interested in asking her PCP about a Freestyle Libre CGM prescription, if it was affordable.   Smith Mince RN BSN CDE Diabetes Coordinator Pager: [phone]  8am-5pm

## 2022-11-03 NOTE — Discharge Summary (Signed)
Physician Discharge Summary  [name] ZOX:[id number] DOB: [birth year] DOA: 11/02/2022  PCP: Lucky Cowboy, MD  Admit date: 11/02/2022 Discharge date: 11/03/2022 30 Day Unplanned Readmission Risk Score    Flowsheet Row ED to Hosp-Admission (Current) from 11/02/2022 in [place] [facility]  30 Day Unplanned Readmission Risk Score (%) 13.52 Filed at 11/03/2022 1200       This score is the patient's risk of an unplanned readmission within 30 days of being discharged (0 -100%). The score is based on dignosis, age, lab data, medications, orders, and past utilization.   Low:  0-14.9   Medium: 15-21.9   High: 22-29.9   Extreme: 30 and above          Admitted From: Home Disposition: Home  Recommendations for Outpatient Follow-up:  Follow up with PCP in 1-2 weeks Please obtain BMP/CBC in one week Please follow up with your PCP on the following pending results: Unresulted Labs (From admission, onward)     Start     Ordered   11/03/22 0500  Hemoglobin A1c  Tomorrow morning,   R        11/02/22 1742              Home Health: None Equipment/Devices: None  Discharge Condition: Stable CODE STATUS: Full code Diet recommendation: Cardiac  Following HPI and ED course is copied from admitting hospitalist H&P. HPI: [name] is a 89 y.o. female with medical history significant of hypertension, hyperlipidemia, diabetes mellitus type 2, and chronic kidney disease stage IIIb who presents due to abnormal labs.  For the last week she had been having pain with urination, left lower quadrant abdominal pain, and urinary frequency.  She had been seen by her primary care provider yesterday and diagnosed with a urinary tract infection.  Urine cultures had been obtained and she was started on ciprofloxacin.  Patient reported taking 1 dose.  Blood work was also obtained and revealed WBC 23.1, sodium 134, CO2 30, BUN 39, creatinine 1.74, and glucose 550.  She was advised to come to  the hospital for further evaluation.  [name] son makes note of patient having urinary tract infection couple months ago for which she had been given ciprofloxacin and taking the medication for about a week, but dizziness for which medication was stopped. She normally does not take insulin every day unless her blood sugars are elevated.     In the emergency department patient was noted to be afebrile with heart rates 51-69, and all other vital signs noted to be labile.  Labs noted WBC 12.2, BUN 41, creatinine 1.91.  Urinalysis significant for large leukocytes, few bacteria, greater than 50 WBCs.  Urine cultures have been obtained.  Patient was given 1 L of lactated Ringer's and started on Rocephin IV.  Subjective: Seen and examined.  Patient has no complaints at all.  Brief/Interim Summary: Briefly, patient was basically admitted due to UTI since she had significantly high leukocytosis of 22,000 as outpatient but upon presentation it was only 12,000.  Patient was afebrile.  She was receiving ciprofloxacin as outpatient and she was placed on IV Rocephin here.  Patient was also admitted with a diagnosis of AKI on CKD stage IIIb, baseline creatinine is 1.6, her creatinine was 1.91.  She was given IV fluids.  Creatinine improved and back to baseline today.  She was also hyperglycemic.  Patient is supposed to take 30 units of long-acting insulin daily but she says that she takes as needed  as that is how she has been advised.  Hemoglobin A1c is pending.  I had a lengthy discussion with the patient that she is supposed to take long-acting insulin on daily basis not on as-needed basis.  I have also consulted diabetes coordinator to educate her on management of the diabetes.  Urine culture is negative.  Patient is medically stable and thus she is going to be discharged home.  She is advised to resume ciprofloxacin outpatient and complete the course and follow-up with PCP within 7 days.  I tried calling patient's  husband twice and had to leave voicemail.  Discharge Diagnoses:  Principal Problem:   UTI (urinary tract infection) Active Problems:   Acute kidney injury superimposed on chronic kidney disease (HCC)   Type 2 diabetes mellitus with complication, with long-term current use of insulin (HCC)   Hyperlipidemia associated with type 2 diabetes mellitus (HCC)    Discharge Instructions   Allergies as of 11/03/2022       Reactions   Ciprofloxacin    Dizziness   Morphine And Codeine Other (See Comments)   fuzzy feeling        Medication List     TAKE these medications    Accu-Chek Aviva Soln Check blood sugar 3 times daily-DX-E11.22   Adjustable Lancing Device Misc CHECK BLOOD SUGAR 3 TIMES A DAY   aspirin 81 MG tablet Take 81 mg by mouth daily.   Basaglar KwikPen 100 UNIT/ML DIAL AND INJECT 30 UNITS UNDER THE SKIN ONCE DAILY AS DIRECTED   bisoprolol-hydrochlorothiazide 2.5-6.25 MG tablet Commonly known as: ZIAC Take  1 tablet  Daily  for BP   Blood Glucose Monitoring Suppl Devi Use to test blood sugar three times a day   Centrum Silver 50+Women Tabs Take 1 tablet by mouth daily.   ciprofloxacin 250 MG tablet Commonly known as: CIPRO Take 1 tablet 2 x /day with Food for Infection What changed:  how much to take how to take this when to take this additional instructions   Comfort EZ Pen Needles 32G X 4 MM Misc Generic drug: Insulin Pen Needle USE 2 TIMES DAILY AS DIRECTED , 1 PEN NEEDLE PER INJECTION.   Continuous Blood Gluc Sensor Misc Check blood sugar 3 times a day- Dx: E11.8/Z79.4   FreeStyle Libre Sensor System Misc Check blood sugar 3 times a day. Dx:E11.8, Z79.4   docusate sodium 100 MG capsule Commonly known as: COLACE Take 1 capsule twice daily as needed for constipation with a bottle of water.   dorzolamide-timolol 2-0.5 % ophthalmic solution Commonly known as: COSOPT Place 1 drop into both eyes 2 (two) times daily.   Easy Comfort Insulin  Syringe 30G X 1/2" 0.5 ML Misc Generic drug: Insulin Syringe-Needle U-100   fluconazole 150 MG tablet Commonly known as: Diflucan Take 1 tablet (150 mg) at the sign of symptoms.  If not resolved after 72 hours may take additional 150 mg dosage.   insulin lispro 100 UNIT/ML injection Commonly known as: HUMALOG Inject 5-10 Units into the skin 3 (three) times daily with meals. Per sliding scale   ipratropium 0.03 % nasal spray Commonly known as: ATROVENT Place 2 sprays into the nose 3 (three) times daily.   Lancets Misc Use to test blood sugar three times a day   latanoprost 0.005 % ophthalmic solution Commonly known as: XALATAN Place 1 drop into both eyes at bedtime.   linaclotide 72 MCG capsule Commonly known as: LINZESS Start taking 1 cap daily for constipation. What changed:  how much to take how to take this when to take this reasons to take this additional instructions   meclizine 25 MG tablet Commonly known as: ANTIVERT Take 0.5-1 tablets (12.5-25 mg total) by mouth 3 (three) times daily as needed for dizziness.   OneTouch Verio test strip Generic drug: glucose blood USE 1 STRIP TO CHECK GLUCOSE THREE TIMES DAILY AS DIRECTED   rosuvastatin 20 MG tablet Commonly known as: CRESTOR Take   1 tablet  Daily for Cholesterol                                                                 /                                                          TAKE                                              BY                                        MOUTH                                      ONCE ? DAILY What changed:  how much to take how to take this when to take this additional instructions   traMADol 50 MG tablet Commonly known as: ULTRAM Take 50 mg by mouth every 6 (six) hours as needed for moderate pain. Pt only takes 2 pills per night   Vitamin D3 50 MCG (2000 UT) Tabs Take 1 capsule by mouth 2 (two) times daily.        Follow-up Information     Lucky Cowboy, MD  Follow up in 1 week(s).   Specialty: Internal Medicine Contact information: [address] [place] [address] [phone]                Allergies  Allergen Reactions   Ciprofloxacin     Dizziness    Morphine And Codeine Other (See Comments)    fuzzy feeling    Consultations: None   Procedures/Studies: DG Chest Port 1 View  Result Date: 11/02/2022 CLINICAL DATA:  Leukocytosis EXAM: PORTABLE CHEST 1 VIEW COMPARISON:  Chest x-ray 12/21/2021. FINDINGS: The heart size and mediastinal contours are within normal limits. Both lungs are clear. No visible pleural effusions or pneumothorax. No acute osseous abnormality. IMPRESSION: No active disease. Electronically Signed   By: Feliberto Harts M.D.   On: 11/02/2022 11:47     Discharge Exam: Vitals:   11/03/22 0459 11/03/22 0754  BP: (!) 156/54 (!) 164/56  Pulse: (!) 54 (!) 54  Resp: 18 16  Temp: (!) 97.3 F (36.3 C) 97.6 F (36.4 C)  SpO2: 96% 99%   Vitals:   11/02/22  1644 11/02/22 2000 11/03/22 0459 11/03/22 0754  BP: 118/77 (!) 101/51 (!) 156/54 (!) 164/56  Pulse: 83 (!) 56 (!) 54 (!) 54  Resp: 18 18 18 16   Temp: 97.7 F (36.5 C) 97.8 F (36.6 C) (!) 97.3 F (36.3 C) 97.6 F (36.4 C)  TempSrc: Oral Oral Oral Oral  SpO2: 92% 95% 96% 99%  Weight:   47.6 kg   Height:        General: Pt is alert, awake, not in acute distress Cardiovascular: RRR, S1/S2 +, no rubs, no gallops Respiratory: CTA bilaterally, no wheezing, no rhonchi Abdominal: Soft, NT, ND, bowel sounds + Extremities: no edema, no cyanosis    The results of significant diagnostics from this hospitalization (including imaging, microbiology, ancillary and laboratory) are listed below for reference.     Microbiology: Recent Results (from the past 240 hour(s))  Urine Culture     Status: Abnormal (Preliminary result)   Collection Time: 11/01/22  2:39 PM   Specimen: Urine  Result Value Ref Range Status   MICRO NUMBER: [id number]   Preliminary   SPECIMEN QUALITY: Adequate  Preliminary   Sample Source URINE  Preliminary   STATUS: PRELIMINARY  Preliminary   ISOLATE 1: Gram negative bacilli isolated (A)  Preliminary    Comment: Greater than 100,000 CFU/mL of Gram negative bacilli isolated Identification and susceptibilities to follow.  MICROSCOPIC MESSAGE     Status: None   Collection Time: 11/01/22  2:39 PM  Result Value Ref Range Status   Note   Final    Comment: This urine was analyzed for the presence of WBC,  RBC, bacteria, casts, and other formed elements.  Only those elements seen were reported. . .   Urine Culture     Status: None   Collection Time: 11/02/22  1:05 PM   Specimen: Urine, Clean Catch  Result Value Ref Range Status   Specimen Description URINE, CLEAN CATCH  Final   Special Requests NONE Reflexed from [id number]  Final   Culture   Final    NO GROWTH Performed at [facility], [address], [place], [place] [address]    Report Status 11/03/2022 FINAL  Final     Labs: BNP (last 3 results) No results for input(s): "BNP" in the last 8760 hours. Basic Metabolic Panel: Recent Labs  Lab 11/01/22 1439 11/02/22 1114 11/02/22 1125 11/03/22 0904  NA 134* 134* 135 138  K 4.4 3.9 3.9 3.8  CL 93* 98  --  105  CO2 30 24  --  27  GLUCOSE 550* 288*  --  114*  BUN 39* 41*  --  35*  CREATININE 1.74* 1.91*  --  1.53*  CALCIUM 10.0 9.2  --  8.7*   Liver Function Tests: Recent Labs  Lab 11/01/22 1439 11/02/22 1114  AST 60* 36  ALT 49* 40  ALKPHOS  --  72  BILITOT 0.5 0.5  PROT 7.5 6.9  ALBUMIN  --  3.2*   No results for input(s): "LIPASE", "AMYLASE" in the last 168 hours. No results for input(s): "AMMONIA" in the last 168 hours. CBC: Recent Labs  Lab 11/01/22 1439 11/02/22 1114 11/02/22 1125 11/03/22 0904  WBC 23.1* 12.2*  12.2*  --  12.6*  NEUTROABS 18,134* 6.5  --   --   HGB 14.0 12.6  12.4 12.9 12.4  HCT 42.6 37.5  37.2 38.0 37.4  MCV 97.9 94.2  93.5  --  95.7   PLT 292 243  236  --  224   Cardiac Enzymes: No results for input(s): "CKTOTAL", "CKMB", "CKMBINDEX", "TROPONINI" in the last 168 hours. BNP: Invalid input(s): "POCBNP" CBG: Recent Labs  Lab 11/02/22 1440 11/02/22 1744 11/02/22 1959 11/03/22 0754 11/03/22 1149  GLUCAP 283* 281* 362* 98 188*   D-Dimer No results for input(s): "DDIMER" in the last 72 hours. Hgb A1c No results for input(s): "HGBA1C" in the last 72 hours. Lipid Profile No results for input(s): "CHOL", "HDL", "LDLCALC", "TRIG", "CHOLHDL", "LDLDIRECT" in the last 72 hours. Thyroid function studies No results for input(s): "TSH", "T4TOTAL", "T3FREE", "THYROIDAB" in the last 72 hours.  Invalid input(s): "FREET3" Anemia work up No results for input(s): "VITAMINB12", "FOLATE", "FERRITIN", "TIBC", "IRON", "RETICCTPCT" in the last 72 hours. Urinalysis    Component Value Date/Time   COLORURINE YELLOW 11/02/2022 1305   APPEARANCEUR CLOUDY (A) 11/02/2022 1305   LABSPEC 1.006 11/02/2022 1305   PHURINE 5.0 11/02/2022 1305   GLUCOSEU >=500 (A) 11/02/2022 1305   HGBUR SMALL (A) 11/02/2022 1305   BILIRUBINUR NEGATIVE 11/02/2022 1305   KETONESUR NEGATIVE 11/02/2022 1305   PROTEINUR 30 (A) 11/02/2022 1305   UROBILINOGEN 0.2 07/29/2012 1237   NITRITE NEGATIVE 11/02/2022 1305   LEUKOCYTESUR LARGE (A) 11/02/2022 1305   Sepsis Labs Recent Labs  Lab 11/01/22 1439 11/02/22 1114 11/03/22 0904  WBC 23.1* 12.2*  12.2* 12.6*   Microbiology Recent Results (from the past 240 hour(s))  Urine Culture     Status: Abnormal (Preliminary result)   Collection Time: 11/01/22  2:39 PM   Specimen: Urine  Result Value Ref Range Status   MICRO NUMBER: [id number]  Preliminary   SPECIMEN QUALITY: Adequate  Preliminary   Sample Source URINE  Preliminary   STATUS: PRELIMINARY  Preliminary   ISOLATE 1: Gram negative bacilli isolated (A)  Preliminary    Comment: Greater than 100,000 CFU/mL of Gram negative bacilli isolated  Identification and susceptibilities to follow.  MICROSCOPIC MESSAGE     Status: None   Collection Time: 11/01/22  2:39 PM  Result Value Ref Range Status   Note   Final    Comment: This urine was analyzed for the presence of WBC,  RBC, bacteria, casts, and other formed elements.  Only those elements seen were reported. . .   Urine Culture     Status: None   Collection Time: 11/02/22  1:05 PM   Specimen: Urine, Clean Catch  Result Value Ref Range Status   Specimen Description URINE, CLEAN CATCH  Final   Special Requests NONE Reflexed from [id number]  Final   Culture   Final    NO GROWTH Performed at [facility], [address], [place], [place] [address]    Report Status 11/03/2022 FINAL  Final    FURTHER DISCHARGE INSTRUCTIONS:   Get Medicines reviewed and adjusted: Please take all your medications with you for your next visit with your Primary MD   Laboratory/radiological data: Please request your Primary MD to go over all hospital tests and procedure/radiological results at the follow up, please ask your Primary MD to get all Hospital records sent to his/her office.   In some cases, they will be blood work, cultures and biopsy results pending at the time of your discharge. Please request that your primary care M.D. goes through all the records of your hospital data and follows up on these results.   Also Note the following: If you experience worsening of your admission symptoms, develop shortness of breath, life threatening emergency, suicidal or homicidal thoughts you must  seek medical attention immediately by calling 911 or calling your MD immediately  if symptoms less severe.   You must read complete instructions/literature along with all the possible adverse reactions/side effects for all the Medicines you take and that have been prescribed to you. Take any new Medicines after you have completely understood and accpet all the possible adverse reactions/side effects.     Do not drive when taking Pain medications or sleeping medications (Benzodaizepines)   Do not take more than prescribed Pain, Sleep and Anxiety Medications. It is not advisable to combine anxiety,sleep and pain medications without talking with your primary care practitioner   Special Instructions: If you have smoked or chewed Tobacco  in the last 2 yrs please stop smoking, stop any regular Alcohol  and or any Recreational drug use.   Wear Seat belts while driving.   Please note: You were cared for by a hospitalist during your hospital stay. Once you are discharged, your primary care physician will handle any further medical issues. Please note that NO REFILLS for any discharge medications will be authorized once you are discharged, as it is imperative that you return to your primary care physician (or establish a relationship with a primary care physician if you do not have one) for your post hospital discharge needs so that they can reassess your need for medications and monitor your lab values  Time coordinating discharge: Over 30 minutes  SIGNED:   Hughie Closs, MD  [facility] 11/03/2022, 12:34 PM *Please note that this is a verbal dictation therefore any spelling or grammatical errors are due to the "[employer] One" system interpretation. If 7PM-7AM, please contact night-coverage [email or web address]

## 2022-11-03 NOTE — Care Management Obs Status (Signed)
MEDICARE OBSERVATION STATUS NOTIFICATION   Patient Details  Name: [name] MRN: [id number] Date of Birth: [birth year]   Medicare Observation Status Notification Given:  Yes    Janae Bridgeman, RN 11/03/2022, 12:31 PM

## 2022-11-04 LAB — HEMOGLOBIN A1C
Hgb A1c MFr Bld: 12.6 % — ABNORMAL HIGH (ref 4.8–5.6)
Mean Plasma Glucose: 315 mg/dL

## 2022-11-09 NOTE — Progress Notes (Signed)
Hospital follow up  Assessment and Plan: Hospital visit follow up for:      All medications were reviewed with patient and family and fully reconciled. All questions answered fully, and patient and family members were encouraged to call the office with any further questions or concerns. Discussed goal to avoid readmission related to this diagnosis.  There are no discontinued medications.  CAN NOT DO FOR BCBS REGULAR OR MEDICARE***  Over 40 minutes of exam, counseling, chart review, and complex, high/moderate level critical decision making was performed this visit.   Future Appointments  Date Time Provider Department Center  11/10/2022  2:30 PM Raynelle Dick, NP [facility] None  12/14/2022  2:00 PM Adela Glimpse, NP [facility] None     HPI 89 y.o.female presents for follow up for transition from recent hospitalization or SNIF stay. Admit date to the hospital was 11/02/22, patient was discharged from the hospital on 11/03/22 and our clinical staff contacted the office the day after discharge to set up a follow up appointment. The discharge summary, medications, and diagnostic test results were reviewed before meeting with the patient. The patient was admitted for:  Brief/Interim Summary: Briefly, patient was basically admitted due to UTI since she had significantly high leukocytosis of 22,000 as outpatient but upon presentation it was only 12,000.  Patient was afebrile.  She was receiving ciprofloxacin as outpatient and she was placed on IV Rocephin here.  Patient was also admitted with a diagnosis of AKI on CKD stage IIIb, baseline creatinine is 1.6, her creatinine was 1.91.  She was given IV fluids.  Creatinine improved and back to baseline today.  She was also hyperglycemic.  Patient is supposed to take 30 units of long-acting insulin daily but she says that she takes as needed as that is how she has been advised.  Hemoglobin A1c is pending.  I had a lengthy discussion with the patient  that she is supposed to take long-acting insulin on daily basis not on as-needed basis.  I have also consulted diabetes coordinator to educate her on management of the diabetes.  Urine culture is negative.  Patient is medically stable and thus she is going to be discharged home.  She is advised to resume ciprofloxacin outpatient and complete the course and follow-up with PCP within 7 days.  I tried calling patient's husband twice and had to leave voicemail.   Discharge Diagnoses:  Principal Problem:   UTI (urinary tract infection) Active Problems:   Acute kidney injury superimposed on chronic kidney disease (HCC)   Type 2 diabetes mellitus with complication, with long-term current use of insulin (HCC)   Hyperlipidemia associated with type 2 diabetes mellitus (HCC)    Home health {ACTION; IS/IS [id number]} involved.   Images while in the hospital: [facility] Chest Port 1 View  Result Date: 11/02/2022 CLINICAL DATA:  Leukocytosis EXAM: PORTABLE CHEST 1 VIEW COMPARISON:  Chest x-ray 12/21/2021. FINDINGS: The heart size and mediastinal contours are within normal limits. Both lungs are clear. No visible pleural effusions or pneumothorax. No acute osseous abnormality. IMPRESSION: No active disease. Electronically Signed   By: Feliberto Harts M.D.   On: 11/02/2022 11:47     Current Outpatient Medications (Endocrine & Metabolic):    Insulin Glargine (BASAGLAR KWIKPEN) 100 UNIT/ML, DIAL AND INJECT 30 UNITS UNDER THE SKIN ONCE DAILY AS DIRECTED   insulin lispro (HUMALOG) 100 UNIT/ML injection, Inject 5-10 Units into the skin 3 (three) times daily with meals. Per sliding scale  Current Outpatient Medications (Cardiovascular):  bisoprolol-hydrochlorothiazide (ZIAC) 2.5-6.25 MG tablet, Take  1 tablet  Daily  for BP   rosuvastatin (CRESTOR) 20 MG tablet, Take   1 tablet  Daily for Cholesterol                                                                 /                                                           TAKE                                              BY                                        MOUTH                                      ONCE ? DAILY (Patient taking differently: Take 20 mg by mouth daily.)  Current Outpatient Medications (Respiratory):    ipratropium (ATROVENT) 0.03 % nasal spray, Place 2 sprays into the nose 3 (three) times daily.  Current Outpatient Medications (Analgesics):    aspirin 81 MG tablet, Take 81 mg by mouth daily.   traMADol (ULTRAM) 50 MG tablet, Take 50 mg by mouth every 6 (six) hours as needed for moderate pain. Pt only takes 2 pills per night   Current Outpatient Medications (Other):    Blood Glucose Calibration (ACCU-CHEK AVIVA) SOLN, Check blood sugar 3 times daily-DX-E11.22   Blood Glucose Monitoring Suppl DEVI, Use to test blood sugar three times a day   Cholecalciferol (VITAMIN D3) 2000 UNITS TABS, Take 1 capsule by mouth 2 (two) times daily.   ciprofloxacin (CIPRO) 250 MG tablet, Take 1 tablet 2 x /day with Food for Infection (Patient taking differently: Take 250 mg by mouth 2 (two) times daily.)   COMFORT EZ PEN NEEDLES 32G X 4 MM MISC, USE 2 TIMES DAILY AS DIRECTED , 1 PEN NEEDLE PER INJECTION.   Continuous Blood Gluc Sensor (FREESTYLE LIBRE SENSOR SYSTEM) MISC, Check blood sugar 3 times a day. Dx:E11.8, Z79.4   Continuous Blood Gluc Sensor MISC, Check blood sugar 3 times a day- Dx: E11.8/Z79.4   docusate sodium (COLACE) 100 MG capsule, Take 1 capsule twice daily as needed for constipation with a bottle of water.   dorzolamide-timolol (COSOPT) 22.3-6.8 MG/ML ophthalmic solution, Place 1 drop into both eyes 2 (two) times daily.   EASY COMFORT INSULIN SYRINGE 30G X 1/2" 0.5 ML MISC,    fluconazole (DIFLUCAN) 150 MG tablet, Take 1 tablet (150 mg) at the sign of symptoms.  If not resolved after 72 hours may take additional 150 mg dosage. (Patient not taking: Reported on 11/01/2022)   glucose blood (ONETOUCH VERIO) test strip, USE 1  STRIP TO CHECK  GLUCOSE THREE TIMES DAILY AS DIRECTED   Lancet Devices (ADJUSTABLE LANCING DEVICE) MISC, CHECK BLOOD SUGAR 3 TIMES A DAY   Lancets MISC, Use to test blood sugar three times a day   latanoprost (XALATAN) 0.005 % ophthalmic solution, Place 1 drop into both eyes at bedtime.   linaclotide (LINZESS) 72 MCG capsule, Start taking 1 cap daily for constipation. (Patient taking differently: Take 72 mcg by mouth daily as needed (For constipation).)   meclizine (ANTIVERT) 25 MG tablet, Take 0.5-1 tablets (12.5-25 mg total) by mouth 3 (three) times daily as needed for dizziness.   Multiple Vitamins-Minerals (CENTRUM SILVER 50+WOMEN) TABS, Take 1 tablet by mouth daily.  Past Medical History:  Diagnosis Date   Arthritis    Chronic back pain    Cystoid macular edema of right eye 08/19/2019   Diabetic neuropathy (HCC)    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) 11/18/2019   Hypercholesteremia    Hypertension    Liver lesion    Neuropathy    Type II or unspecified type diabetes mellitus without mention of complication, not stated as uncontrolled    Vitamin D deficiency    Wears glasses      Allergies  Allergen Reactions   Ciprofloxacin     Dizziness    Morphine And Codeine Other (See Comments)    fuzzy feeling    ROS: all negative except above.   Physical Exam: There were no vitals filed for this visit. There were no vitals taken for this visit. General Appearance: Well nourished, in no apparent distress. Eyes: PERRLA, EOMs, conjunctiva no swelling or erythema Sinuses: No Frontal/maxillary tenderness ENT/Mouth: Ext aud canals clear, TMs without erythema, bulging. No erythema, swelling, or exudate on post pharynx.  Tonsils not swollen or erythematous. Hearing normal.  Neck: Supple, thyroid normal.  Respiratory: Respiratory effort normal, BS equal bilaterally without rales, rhonchi, wheezing or stridor.  Cardio: RRR with no MRGs.  Brisk peripheral pulses without edema.  Abdomen: Soft, + BS.  Non tender, no guarding, rebound, hernias, masses. Lymphatics: Non tender without lymphadenopathy.  Musculoskeletal: Full ROM, 5/5 strength, normal gait.  Skin: Warm, dry without rashes, lesions, ecchymosis.  Neuro: Cranial nerves intact. Normal muscle tone, no cerebellar symptoms. Sensation intact.  Psych: Awake and oriented X 3, normal affect, Insight and Judgment appropriate.     Raynelle Dick, NP 10:34 AM [facility]

## 2022-11-10 ENCOUNTER — Telehealth: Payer: Self-pay

## 2022-11-10 ENCOUNTER — Ambulatory Visit: Payer: Medicare Other | Admitting: Nurse Practitioner

## 2022-11-10 ENCOUNTER — Encounter: Payer: Self-pay | Admitting: Nurse Practitioner

## 2022-11-10 VITALS — BP 104/58 | HR 62 | Temp 97.7°F | Ht 63.0 in | Wt 106.2 lb

## 2022-11-10 DIAGNOSIS — N189 Chronic kidney disease, unspecified: Secondary | ICD-10-CM

## 2022-11-10 DIAGNOSIS — E118 Type 2 diabetes mellitus with unspecified complications: Secondary | ICD-10-CM

## 2022-11-10 DIAGNOSIS — N390 Urinary tract infection, site not specified: Secondary | ICD-10-CM

## 2022-11-10 DIAGNOSIS — E1169 Type 2 diabetes mellitus with other specified complication: Secondary | ICD-10-CM

## 2022-11-10 NOTE — Telephone Encounter (Signed)
LVM for the patient's son Casimiro Needle to call the office to discuss the patient's medication regimen and return appointment on Monday, July 22nd at 3:30 pm.

## 2022-11-14 ENCOUNTER — Encounter: Payer: Self-pay | Admitting: Nurse Practitioner

## 2022-11-14 ENCOUNTER — Ambulatory Visit (INDEPENDENT_AMBULATORY_CARE_PROVIDER_SITE_OTHER): Payer: Medicare Other | Admitting: Nurse Practitioner

## 2022-11-14 VITALS — BP 124/64 | HR 52 | Temp 97.7°F | Ht 63.0 in | Wt 106.2 lb

## 2022-11-14 DIAGNOSIS — E1122 Type 2 diabetes mellitus with diabetic chronic kidney disease: Secondary | ICD-10-CM

## 2022-11-14 DIAGNOSIS — N3090 Cystitis, unspecified without hematuria: Secondary | ICD-10-CM | POA: Diagnosis not present

## 2022-11-14 DIAGNOSIS — E118 Type 2 diabetes mellitus with unspecified complications: Secondary | ICD-10-CM

## 2022-11-14 DIAGNOSIS — E1169 Type 2 diabetes mellitus with other specified complication: Secondary | ICD-10-CM | POA: Diagnosis not present

## 2022-11-14 DIAGNOSIS — E785 Hyperlipidemia, unspecified: Secondary | ICD-10-CM | POA: Diagnosis not present

## 2022-11-14 DIAGNOSIS — Z794 Long term (current) use of insulin: Secondary | ICD-10-CM

## 2022-11-14 DIAGNOSIS — I10 Essential (primary) hypertension: Secondary | ICD-10-CM | POA: Diagnosis not present

## 2022-11-14 DIAGNOSIS — M7061 Trochanteric bursitis, right hip: Secondary | ICD-10-CM

## 2022-11-14 DIAGNOSIS — Z79899 Other long term (current) drug therapy: Secondary | ICD-10-CM | POA: Diagnosis not present

## 2022-11-14 DIAGNOSIS — Z09 Encounter for follow-up examination after completed treatment for conditions other than malignant neoplasm: Secondary | ICD-10-CM

## 2022-11-14 DIAGNOSIS — N183 Chronic kidney disease, stage 3 unspecified: Secondary | ICD-10-CM

## 2022-11-14 LAB — CBC WITH DIFFERENTIAL/PLATELET
Basophils Absolute: 91 cells/uL (ref 0–200)
Eosinophils Absolute: 376 cells/uL (ref 15–500)
Hemoglobin: 12.5 g/dL (ref 11.7–15.5)
MCV: 96.2 fL (ref 80.0–100.0)
MPV: 12.7 fL — ABNORMAL HIGH (ref 7.5–12.5)
Monocytes Relative: 7.7 %
Neutro Abs: 6076 cells/uL (ref 1500–7800)
RDW: 11.6 % (ref 11.0–15.0)

## 2022-11-14 MED ORDER — METFORMIN HCL ER 500 MG PO TB24: ORAL_TABLET | ORAL | 1 refills | Status: DC

## 2022-11-14 MED ORDER — FREESTYLE LIBRE SENSOR SYSTEM MISC
0 refills | Status: DC
Start: 2022-11-14 — End: 2023-10-12

## 2022-11-14 MED ORDER — TRIAMCINOLONE ACETONIDE 0.025 % EX OINT
1.0000 | TOPICAL_OINTMENT | Freq: Two times a day (BID) | CUTANEOUS | 0 refills | Status: DC
Start: 2022-11-14 — End: 2023-10-12

## 2022-11-14 NOTE — Progress Notes (Signed)
Hospital follow up  Assessment and Plan: Hospital visit follow up for:   1. Hospital discharge follow-up Reviewed discharge instructions in full including medication changes, diagnostics, labs, and future follow ups appointment. All questions and concerns addressed.   - CBC with Differential/Platelet - COMPLETE METABOLIC PANEL WITH GFR - Urinalysis, Routine w reflex microscopic - Urine Culture  2. Cystitis Stay well hydrated to keep urinary system well flushed Consider daily cranberry juice or oral supplement to help any bacteria from adhering to bladder wall causing increase for infection. Monitor for any increase in fever, chills, N/V, abdominal pain, hematuria.   Contact office or report to ER for further evaluation if s/s fail to improve or any sign of worsening infection as noted above.  - Urinalysis, Routine w reflex microscopic - Urine Culture  CKD stage 3 due to type 2 diabetes mellitus (HCC) Discussed how what you eat and drink can aide in kidney protection. Stay well hydrated. Avoid high salt foods. Avoid NSAIDS. Keep BP and BG well controlled.   Take medications as prescribed. Remain active and exercise as tolerated daily. Maintain weight.  Continue to monitor. Check CMP/GFR/Microablumin  - COMPLETE METABOLIC PANEL WITH GFR  Type 2 diabetes mellitus with complication, with long-term current use of insulin (HCC) Restart Metformin Start [employer] and Device  Education: Reviewed 'ABCs' of diabetes management  Discussed goals to be met and/or maintained include A1C (<7) Blood pressure (<130/80) Cholesterol (LDL <70) Continue Eye Exam yearly  Continue Dental Exam Q6 mo Discussed dietary recommendations Discussed Physical Activity recommendations Check A1C  - COMPLETE METABOLIC PANEL WITH GFR - metFORMIN (GLUCOPHAGE-XR) 500 MG 24 hr tablet; Take 1 tablet 1 x /day with Meals for Diabetes.  Dispense: 180 tablet; Refill: 1 - Continuous Glucose Sensor  (FREESTYLE LIBRE SENSOR SYSTEM) MISC; Check blood sugar 3 times a day. Dx:E11.8, Z79.4  Dispense: 1 each; Refill: 0  Essential hypertension Discussed DASH (Dietary Approaches to Stop Hypertension) DASH diet is lower in sodium than a typical American diet. Cut back on foods that are high in saturated fat, cholesterol, and trans fats. Eat more whole-grain foods, fish, poultry, and nuts Remain active and exercise as tolerated daily.  Monitor BP at home-Call if greater than 130/80.  Check CMP/CBC  - CBC with Differential/Platelet - COMPLETE METABOLIC PANEL WITH GFR  Hyperlipidemia associated with type 2 diabetes mellitus (HCC) Discussed lifestyle modifications. Recommended diet heavy in fruits and veggies, omega 3's. Decrease consumption of animal meats, cheeses, and dairy products. Remain active and exercise as tolerated. Continue to monitor. Check lipids/TSH  Medication management All medications discussed and reviewed in full. All questions and concerns regarding medications addressed.    - CBC with Differential/Platelet - COMPLETE METABOLIC PANEL WITH GFR - Urinalysis, Routine w reflex microscopic - Urine Culture  Trochanteric bursitis of right hip Apply topical steroid cream and pressure dressing to avoid opening of skin. Continue to monitor  - triamcinolone (KENALOG) 0.025 % ointment; Apply 1 Application topically 2 (two) times daily.  Dispense: 30 g; Refill: 0    All medications were reviewed with patient and family and fully reconciled. All questions answered fully, and patient and family members were encouraged to call the office with any further questions or concerns. Discussed goal to avoid readmission related to this diagnosis.   Over 40 minutes of exam, counseling, chart review, and complex, high/moderate level critical decision making was performed this visit.   Future Appointments  Date Time Provider Department Center  11/14/2022  3:30 PM Kelsey Glimpse,  NP  [facility] None  12/14/2022  2:00 PM Kelsey Nordmeyer, NP [facility] None     HPI 89 y.o.female presents for follow up for transition from recent hospitalization or SNIF stay. Admit date to the hospital was 11/02/22, patient was discharged from the hospital on 11/03/22 and our clinical staff contacted the office the day after discharge to set up a follow up appointment. The discharge summary, medications, and diagnostic test results were reviewed before meeting with the patient. The patient was admitted for:   [name] is a 89 y.o. female with medical history significant of hypertension, hyperlipidemia, diabetes mellitus type 2, and chronic kidney disease stage IIIb who presents due to abnormal labs.  For the last week she had been having pain with urination, left lower quadrant abdominal pain, and urinary frequency.  She was seen in office prior to ED and diagnosed with a urinary tract infection. Ciprofloxacin was initiated. Urine cultures had been obtained and she was started on ciprofloxacin.  Patient reported taking 1 dose.  Blood work was also obtained and revealed WBC 23.1, sodium 134, CO2 30, BUN 39, creatinine 1.74, and glucose 550.  She was advised to come to the hospital for further evaluation.  [name] son makes note of patient having urinary tract infection couple months ago for which she had been given ciprofloxacin and taking the medication for about a week, but dizziness for which medication was stopped. She normally does not take insulin every day unless her blood sugars are elevated.    In the emergency department patient was noted to be afebrile with heart rates 51-69, and all other vital signs noted to be labile.  Labs noted WBC 12.2, BUN 41, creatinine 1.91.  Urinalysis significant for large leukocytes, few bacteria, greater than 50 WBCs.  Urine cultures have been obtained.  Patient was given 1 L of lactated Ringer's and started on Rocephin IV.  Home health is not involved.    Images while in the hospital: [facility] Chest Port 1 View  Result Date: 11/02/2022 CLINICAL DATA:  Leukocytosis EXAM: PORTABLE CHEST 1 VIEW COMPARISON:  Chest x-ray 12/21/2021. FINDINGS: The heart size and mediastinal contours are within normal limits. Both lungs are clear. No visible pleural effusions or pneumothorax. No acute osseous abnormality. IMPRESSION: No active disease. Electronically Signed   By: Feliberto Harts M.D.   On: 11/02/2022 11:47     Current Outpatient Medications (Endocrine & Metabolic):    Insulin Glargine (BASAGLAR KWIKPEN) 100 UNIT/ML, DIAL AND INJECT 30 UNITS UNDER THE SKIN ONCE DAILY AS DIRECTED (Patient not taking: Reported on 11/10/2022)   insulin lispro (HUMALOG) 100 UNIT/ML injection, Inject 5-10 Units into the skin 3 (three) times daily with meals. Per sliding scale  Current Outpatient Medications (Cardiovascular):    bisoprolol-hydrochlorothiazide (ZIAC) 2.5-6.25 MG tablet, Take  1 tablet  Daily  for BP   rosuvastatin (CRESTOR) 20 MG tablet, Take   1 tablet  Daily for Cholesterol                                                                 /  TAKE                                              BY                                        MOUTH                                      ONCE ? DAILY (Patient taking differently: Take 20 mg by mouth daily.)  Current Outpatient Medications (Respiratory):    ipratropium (ATROVENT) 0.03 % nasal spray, Place 2 sprays into the nose 3 (three) times daily.  Current Outpatient Medications (Analgesics):    traMADol (ULTRAM) 50 MG tablet, Take 50 mg by mouth every 6 (six) hours as needed for moderate pain. Pt only takes 2 pills per night (Patient not taking: Reported on 11/10/2022)   Current Outpatient Medications (Other):    Blood Glucose Calibration (ACCU-CHEK AVIVA) SOLN, Check blood sugar 3 times daily-DX-E11.22   Blood Glucose Monitoring Suppl DEVI, Use to test blood sugar  three times a day   Cholecalciferol (VITAMIN D3) 2000 UNITS TABS, Take 1 capsule by mouth 2 (two) times daily. PRN   ciprofloxacin (CIPRO) 250 MG tablet, Take 1 tablet 2 x /day with Food for Infection (Patient taking differently: Take 250 mg by mouth 2 (two) times daily.)   COMFORT EZ PEN NEEDLES 32G X 4 MM MISC, USE 2 TIMES DAILY AS DIRECTED , 1 PEN NEEDLE PER INJECTION.   Continuous Blood Gluc Sensor (FREESTYLE LIBRE SENSOR SYSTEM) MISC, Check blood sugar 3 times a day. Dx:E11.8, Z79.4   Continuous Blood Gluc Sensor MISC, Check blood sugar 3 times a day- Dx: E11.8/Z79.4   docusate sodium (COLACE) 100 MG capsule, Take 1 capsule twice daily as needed for constipation with a bottle of water. (Patient not taking: Reported on 11/10/2022)   dorzolamide-timolol (COSOPT) 22.3-6.8 MG/ML ophthalmic solution, Place 1 drop into both eyes 2 (two) times daily. (Patient not taking: Reported on 11/10/2022)   EASY COMFORT INSULIN SYRINGE 30G X 1/2" 0.5 ML MISC,    fluconazole (DIFLUCAN) 150 MG tablet, Take 1 tablet (150 mg) at the sign of symptoms.  If not resolved after 72 hours may take additional 150 mg dosage. (Patient not taking: Reported on 11/01/2022)   glucose blood (ONETOUCH VERIO) test strip, USE 1 STRIP TO CHECK GLUCOSE THREE TIMES DAILY AS DIRECTED   Lancet Devices (ADJUSTABLE LANCING DEVICE) MISC, CHECK BLOOD SUGAR 3 TIMES A DAY   Lancets MISC, Use to test blood sugar three times a day   latanoprost (XALATAN) 0.005 % ophthalmic solution, Place 1 drop into both eyes at bedtime. (Patient not taking: Reported on 11/10/2022)   linaclotide (LINZESS) 72 MCG capsule, Start taking 1 cap daily for constipation. (Patient not taking: Reported on 11/10/2022)   meclizine (ANTIVERT) 25 MG tablet, Take 0.5-1 tablets (12.5-25 mg total) by mouth 3 (three) times daily as needed for dizziness. (Patient not taking: Reported on 11/10/2022)   Multiple Vitamins-Minerals (CENTRUM SILVER 50+WOMEN) TABS, Take 1 tablet by mouth  daily.  Past Medical History:  Diagnosis Date   Arthritis    Chronic back pain    Cystoid macular edema  of right eye 08/19/2019   Diabetic neuropathy (HCC)    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) 11/18/2019   Hypercholesteremia    Hypertension    Liver lesion    Neuropathy    Type II or unspecified type diabetes mellitus without mention of complication, not stated as uncontrolled    Vitamin D deficiency    Wears glasses      Allergies  Allergen Reactions   Ciprofloxacin     Dizziness    Morphine And Codeine Other (See Comments)    fuzzy feeling    ROS: all negative except above.   Physical Exam: There were no vitals filed for this visit. There were no vitals taken for this visit. General Appearance: Well nourished, in no apparent distress. Eyes: PERRLA, EOMs, conjunctiva no swelling or erythema Sinuses: No Frontal/maxillary tenderness ENT/Mouth: Ext aud canals clear, TMs without erythema, bulging. No erythema, swelling, or exudate on post pharynx.  Tonsils not swollen or erythematous. Hearing normal.  Neck: Supple, thyroid normal.  Respiratory: Respiratory effort normal, BS equal bilaterally without rales, rhonchi, wheezing or stridor.  Cardio: RRR with no MRGs. Brisk peripheral pulses without edema.  Abdomen: Soft, + BS.  Non tender, no guarding, rebound, hernias, masses. Lymphatics: Non tender without lymphadenopathy.  Musculoskeletal: Full ROM, 5/5 strength, normal gait.  Skin: Warm, dry without rashes, lesions, ecchymosis.  Neuro: Cranial nerves intact. Normal muscle tone, no cerebellar symptoms. Sensation intact.  Psych: Awake and oriented X 3, normal affect, Insight and Judgment appropriate.     Kelsey Glimpse, NP 2:12 PM [facility]

## 2022-11-14 NOTE — Patient Instructions (Signed)
Hyperglycemia Hyperglycemia is when the sugar (glucose) level in your blood is too high. High blood sugar can happen to people who have or do not have diabetes. High blood sugar can happen quickly. It can be an emergency. What are the causes? If you have diabetes, high blood sugar may be caused by: Medicines that increase blood sugar or affect your control of diabetes. Getting less physical activity. Overeating. Being sick or injured or having an infection. Having surgery. Stress. Not giving yourself enough insulin (if you are taking it). You may have high blood sugar because you have diabetes that has not been diagnosed yet. If you do not have diabetes, high blood sugar may be caused by: Certain medicines. Stress. A bad illness. An infection. Having surgery. Diseases of the pancreas. What increases the risk? This condition is more likely to develop in people who have risk factors for diabetes, such as: Having a family member with diabetes. Certain conditions in which the body's defense system (immune system) attacks itself. These are called autoimmune disorders. Being overweight. Not being active. Having a condition called insulin resistance. Having a history of: Prediabetes. Diabetes when pregnant. Polycystic ovarian syndrome (PCOS). What are the signs or symptoms? This condition may not cause symptoms. If you do have symptoms, they may include: Feeling more thirsty than normal. Needing to pee (urinate) more often than normal. Hunger. Feeling very tired. Blurry eyesight (vision). You may get other symptoms as the condition gets worse, such as: Dry mouth. Pain in your belly (abdomen). Not being hungry (loss of appetite). Breath that smells fruity. Weakness. Weight loss that is not planned. A tingling or numb feeling in your hands or feet. A headache. Cuts or bruises that heal slowly. How is this treated? Treatment depends on the cause of your condition. Treatment may  include: Taking medicine to control your blood sugar levels. Changing your medicine or dosage if you take insulin or other diabetes medicines. Lifestyle changes. These may include: Exercising more. Eating healthier foods. Losing weight. Treating an illness or infection. Checking your blood sugar more often. Stopping or reducing steroid medicines. If your condition gets very bad, you will need to be treated in the hospital. Follow these instructions at home: General instructions Take over-the-counter and prescription medicines only as told by your doctor. Do not smoke or use any products that contain nicotine or tobacco. If you need help quitting, ask your doctor. If you drink alcohol: Limit how much you have to: 0-1 drink a day for women who are not pregnant. 0-2 drinks a day for men. Know how much alcohol is in a drink. In the [place] one drink equals one 12 oz bottle of beer (355 mL), one 5 oz glass of wine (148 mL), or one 1 oz glass of hard liquor (44 mL). Manage stress. If you need help with this, ask your doctor. Do exercises as told by your doctor. Keep all follow-up visits. Eating and drinking  Stay at a healthy weight. Make sure you drink enough fluid when you: Exercise. Get sick. Are in hot temperatures. Drink enough fluid to keep your pee (urine) pale yellow. If you have diabetes:  Know the symptoms of high blood sugar. Follow your diabetes management plan as told by your doctor. Make sure you: Take insulin and medicines as told. Follow your exercise plan. Follow your meal plan. Eat on time. Do not skip meals. Check your blood sugar as often as told. Make sure you check before and after exercise. If you  exercise longer or in a different way, check your blood sugar more often. Follow your sick day plan whenever you cannot eat or drink normally. Make this plan ahead of time with your doctor. Share your diabetes management plan with people in your workplace, school,  and household. Check your pee for ketones when you are ill and as told by your doctor. Carry a card or wear jewelry that says that you have diabetes. Where to find more information American Diabetes Association: www.diabetes.org Contact a doctor if: Your blood sugar level is at or above 240 mg/dL (16.1 mmol/L) for 2 days in a row. You have problems keeping your blood sugar in your target range. You have high blood pressure often. You have signs of illness, such as: Feeling like you may vomit (feeling nauseous). Vomiting. A fever. Get help right away if: Your blood sugar monitor reads "high" even when you are taking insulin. You have trouble breathing. You have a change in how you think, feel, or act (mental status). You feel like you may vomit, and the feeling does not go away. You cannot stop vomiting. These symptoms may be an emergency. Get medical help right away. Call your local emergency services (911 in the [place] Do not wait to see if the symptoms will go away. Do not drive yourself to the hospital. Summary Hyperglycemia is when the sugar (glucose) level in your blood is too high. High blood sugar can happen to people who have or do not have diabetes. Make sure you drink enough fluids and follow your meal plan. Exercise as often as told by your doctor. Contact your doctor if you have problems keeping your blood sugar in your target range. This information is not intended to replace advice given to you by your health care provider. Make sure you discuss any questions you have with your health care provider. Document Revised: 01/23/2020 Document Reviewed: 01/24/2020 Elsevier Patient Education  2024 [employer].

## 2022-11-15 LAB — COMPLETE METABOLIC PANEL WITH GFR
AG Ratio: 1.1 (calc) (ref 1.0–2.5)
ALT: 53 U/L — ABNORMAL HIGH (ref 6–29)
AST: 41 U/L — ABNORMAL HIGH (ref 10–35)
Albumin: 3.9 g/dL (ref 3.6–5.1)
Alkaline phosphatase (APISO): 77 U/L (ref 37–153)
BUN/Creatinine Ratio: 16 (calc) (ref 6–22)
BUN: 24 mg/dL (ref 7–25)
CO2: 33 mmol/L — ABNORMAL HIGH (ref 20–32)
Calcium: 9.7 mg/dL (ref 8.6–10.4)
Chloride: 97 mmol/L — ABNORMAL LOW (ref 98–110)
Creat: 1.51 mg/dL — ABNORMAL HIGH (ref 0.60–0.95)
Globulin: 3.4 g/dL (calc) (ref 1.9–3.7)
Glucose, Bld: 376 mg/dL — ABNORMAL HIGH (ref 65–99)
Potassium: 4.8 mmol/L (ref 3.5–5.3)
Sodium: 136 mmol/L (ref 135–146)
Total Bilirubin: 0.4 mg/dL (ref 0.2–1.2)
Total Protein: 7.3 g/dL (ref 6.1–8.1)
eGFR: 33 mL/min/{1.73_m2} — ABNORMAL LOW (ref >=60)

## 2022-11-15 LAB — URINALYSIS, ROUTINE W REFLEX MICROSCOPIC
Bacteria, UA: NONE SEEN /HPF
Bilirubin Urine: NEGATIVE
Hgb urine dipstick: NEGATIVE
Hyaline Cast: NONE SEEN /LPF
Ketones, ur: NEGATIVE
Nitrite: NEGATIVE
Protein, ur: NEGATIVE
RBC / HPF: NONE SEEN /HPF (ref 0–2)
Specific Gravity, Urine: 1.021 (ref 1.001–1.035)
pH: 6 (ref 5.0–8.0)

## 2022-11-15 LAB — CBC WITH DIFFERENTIAL/PLATELET
Absolute Monocytes: 878 cells/uL (ref 200–950)
Basophils Relative: 0.8 %
Eosinophils Relative: 3.3 %
HCT: 37.8 % (ref 35.0–45.0)
Lymphs Abs: 3979 cells/uL — ABNORMAL HIGH (ref 850–3900)
MCH: 31.8 pg (ref 27.0–33.0)
MCHC: 33.1 g/dL (ref 32.0–36.0)
Neutrophils Relative %: 53.3 %
Platelets: 239 10*3/uL (ref 140–400)
RBC: 3.93 10*6/uL (ref 3.80–5.10)
Total Lymphocyte: 34.9 %
WBC: 11.4 10*3/uL — ABNORMAL HIGH (ref 3.8–10.8)

## 2022-11-15 LAB — URINE CULTURE
MICRO NUMBER:: 15230278
Result:: NO GROWTH
SPECIMEN QUALITY:: ADEQUATE

## 2022-11-15 LAB — MICROSCOPIC MESSAGE

## 2022-12-14 ENCOUNTER — Encounter: Payer: Medicare Other | Admitting: Nurse Practitioner

## 2023-01-04 DIAGNOSIS — M47816 Spondylosis without myelopathy or radiculopathy, lumbar region: Secondary | ICD-10-CM | POA: Diagnosis not present

## 2023-01-04 DIAGNOSIS — M48061 Spinal stenosis, lumbar region without neurogenic claudication: Secondary | ICD-10-CM | POA: Diagnosis not present

## 2023-01-04 DIAGNOSIS — Z681 Body mass index (BMI) 19 or less, adult: Secondary | ICD-10-CM | POA: Diagnosis not present

## 2023-01-06 ENCOUNTER — Other Ambulatory Visit: Payer: Self-pay | Admitting: Internal Medicine

## 2023-01-06 DIAGNOSIS — I10 Essential (primary) hypertension: Secondary | ICD-10-CM

## 2023-01-08 ENCOUNTER — Emergency Department (HOSPITAL_COMMUNITY): Payer: Medicare Other

## 2023-01-08 ENCOUNTER — Encounter (HOSPITAL_COMMUNITY): Payer: Self-pay | Admitting: Emergency Medicine

## 2023-01-08 ENCOUNTER — Emergency Department (HOSPITAL_COMMUNITY)
Admission: EM | Admit: 2023-01-08 | Discharge: 2023-01-08 | Disposition: A | Discharge status: Home / Self Care | Payer: Medicare Other | Attending: Emergency Medicine | Admitting: Emergency Medicine

## 2023-01-08 ENCOUNTER — Other Ambulatory Visit: Payer: Self-pay

## 2023-01-08 DIAGNOSIS — N3000 Acute cystitis without hematuria: Secondary | ICD-10-CM | POA: Diagnosis not present

## 2023-01-08 DIAGNOSIS — Z7982 Long term (current) use of aspirin: Secondary | ICD-10-CM | POA: Insufficient documentation

## 2023-01-08 DIAGNOSIS — Z1152 Encounter for screening for COVID-19: Secondary | ICD-10-CM | POA: Insufficient documentation

## 2023-01-08 DIAGNOSIS — E1165 Type 2 diabetes mellitus with hyperglycemia: Secondary | ICD-10-CM | POA: Insufficient documentation

## 2023-01-08 DIAGNOSIS — I6523 Occlusion and stenosis of bilateral carotid arteries: Secondary | ICD-10-CM | POA: Diagnosis not present

## 2023-01-08 DIAGNOSIS — Z794 Long term (current) use of insulin: Secondary | ICD-10-CM | POA: Diagnosis not present

## 2023-01-08 DIAGNOSIS — R739 Hyperglycemia, unspecified: Secondary | ICD-10-CM

## 2023-01-08 DIAGNOSIS — I672 Cerebral atherosclerosis: Secondary | ICD-10-CM | POA: Diagnosis not present

## 2023-01-08 DIAGNOSIS — Z7984 Long term (current) use of oral hypoglycemic drugs: Secondary | ICD-10-CM | POA: Insufficient documentation

## 2023-01-08 DIAGNOSIS — I10 Essential (primary) hypertension: Secondary | ICD-10-CM | POA: Diagnosis not present

## 2023-01-08 LAB — URINALYSIS, MICROSCOPIC (REFLEX): WBC, UA: >50 WBC/hpf (ref 0–5)

## 2023-01-08 LAB — CBG MONITORING, ED
Glucose-Capillary: 327 mg/dL — ABNORMAL HIGH (ref 70–99)
Glucose-Capillary: 368 mg/dL — ABNORMAL HIGH (ref 70–99)
Glucose-Capillary: 163 mg/dL — ABNORMAL HIGH (ref 70–99)

## 2023-01-08 LAB — URINALYSIS, ROUTINE W REFLEX MICROSCOPIC
Bilirubin Urine: NEGATIVE
Glucose, UA: >=500 mg/dL — AB
Ketones, ur: NEGATIVE mg/dL
Nitrite: NEGATIVE
Protein, ur: NEGATIVE mg/dL
Specific Gravity, Urine: 1.015 (ref 1.005–1.030)
pH: 6 (ref 5.0–8.0)

## 2023-01-08 LAB — I-STAT CHEM 8, ED
BUN: 45 mg/dL — ABNORMAL HIGH (ref 8–23)
Calcium, Ion: 1.18 mmol/L (ref 1.15–1.40)
Chloride: 99 mmol/L (ref 98–111)
Creatinine, Ser: 1.7 mg/dL — ABNORMAL HIGH (ref 0.44–1.00)
Glucose, Bld: 379 mg/dL — ABNORMAL HIGH (ref 70–99)
HCT: 37 % (ref 36.0–46.0)
Hemoglobin: 12.6 g/dL (ref 12.0–15.0)
Potassium: 4.3 mmol/L (ref 3.5–5.1)
Sodium: 138 mmol/L (ref 135–145)
TCO2: 28 mmol/L (ref 22–32)

## 2023-01-08 LAB — I-STAT VENOUS BLOOD GAS, ED
Acid-Base Excess: 3 mmol/L — ABNORMAL HIGH (ref 0.0–2.0)
Bicarbonate: 28.4 mmol/L — ABNORMAL HIGH (ref 20.0–28.0)
Calcium, Ion: 1.23 mmol/L (ref 1.15–1.40)
HCT: 33 % — ABNORMAL LOW (ref 36.0–46.0)
Hemoglobin: 11.2 g/dL — ABNORMAL LOW (ref 12.0–15.0)
O2 Saturation: 95 %
Potassium: 4.2 mmol/L (ref 3.5–5.1)
Sodium: 138 mmol/L (ref 135–145)
TCO2: 30 mmol/L (ref 22–32)
pCO2, Ven: 45.4 mmHg (ref 44–60)
pH, Ven: 7.404 (ref 7.25–7.43)
pO2, Ven: 77 mmHg — ABNORMAL HIGH (ref 32–45)

## 2023-01-08 LAB — CBC
HCT: 35.9 % — ABNORMAL LOW (ref 36.0–46.0)
Hemoglobin: 12 g/dL (ref 12.0–15.0)
MCH: 32.4 pg (ref 26.0–34.0)
MCHC: 33.4 g/dL (ref 30.0–36.0)
MCV: 97 fL (ref 80.0–100.0)
Platelets: 253 10*3/uL (ref 150–400)
RBC: 3.7 MIL/uL — ABNORMAL LOW (ref 3.87–5.11)
RDW: 12.4 % (ref 11.5–15.5)
WBC: 11.8 10*3/uL — ABNORMAL HIGH (ref 4.0–10.5)
nRBC: 0 % (ref 0.0–0.2)

## 2023-01-08 LAB — RESP PANEL BY RT-PCR (RSV, FLU A&B, COVID)  RVPGX2
Influenza A by PCR: NEGATIVE
Influenza B by PCR: NEGATIVE
Resp Syncytial Virus by PCR: NEGATIVE
SARS Coronavirus 2 by RT PCR: NEGATIVE

## 2023-01-08 LAB — BETA-HYDROXYBUTYRIC ACID: Beta-Hydroxybutyric Acid: 0.13 mmol/L (ref 0.05–0.27)

## 2023-01-08 MED ORDER — CEPHALEXIN 500 MG PO CAPS: 500.0000 mg | ORAL_CAPSULE | Freq: Four times a day (QID) | ORAL | 0 refills | Status: AC

## 2023-01-08 MED ORDER — INSULIN ASPART 100 UNIT/ML IJ SOLN
5.0000 [IU] | Freq: Once | INTRAMUSCULAR | Status: AC
  Administered 2023-01-08: 5 [IU] via SUBCUTANEOUS

## 2023-01-08 MED ORDER — SODIUM CHLORIDE 0.9 % IV BOLUS
1000.0000 mL | Freq: Once | INTRAVENOUS | Status: AC
  Administered 2023-01-08: 1000 mL via INTRAVENOUS

## 2023-01-08 NOTE — ED Notes (Signed)
Patient transported to CT 

## 2023-01-08 NOTE — ED Triage Notes (Signed)
Pt from home with reports of hyperglycemia and UTI. Pt reports she has been feeling weak. Denies dysuria. Reports he had a home positive UTI test.

## 2023-01-08 NOTE — ED Provider Notes (Signed)
[place] EMERGENCY DEPARTMENT AT [facility] Provider Note   CSN: [id number] Arrival date & time: 01/08/23  1331     History  Chief Complaint  Patient presents with   Hyperglycemia    [name] is a 89 y.o. female.  The history is provided by the patient, a relative and medical records. No language interpreter was used.  Hyperglycemia    89 year old female significant history of diabetes, hypertension, hypercholesterolemia, chronic back pain, frequent UTI presents ED with concerns of elevated blood sugar.  History obtained through patient and through family member who is at bedside.  For the past 2 days patient appears to be weaker than usual more fatigued, and having increased thirst.  Family member voiced concerns for potential urinary tract infection as she exhibits similar symptoms when she had urinary tract infection in the past.  Patient however is without any complaints.  She denies having any headache chest pain shortness of breath abdominal pain back pain burning urination focal numbness or focal weakness.  She endorsed normal appetite.  No recent change in her medication except metformin a month ago.  Daughter noticed that patient is unsteady on her feet, and has strong urine odor.  Home Medications Prior to Admission medications   Medication Sig Start Date End Date Taking? Authorizing Provider  aspirin EC 81 MG tablet Take 81 mg by mouth daily. Swallow whole.    [provider]  bisoprolol-hydrochlorothiazide [facility]) 2.5-6.25 MG tablet Take  1 tablet  Daily  for BP                                /                                                                   TAKE                                         BY                                                 MOUTH 01/07/23   Lucky Cowboy, MD  Blood Glucose Calibration (ACCU-CHEK AVIVA) SOLN Check blood sugar 3 times daily-DX-E11.22 03/11/15   Lucky Cowboy, MD  Blood Glucose Monitoring Suppl DEVI  Use to test blood sugar three times a day 04/06/22   Adela Glimpse, NP  Cholecalciferol (VITAMIN D3) 2000 UNITS TABS Take 1 capsule by mouth 2 (two) times daily. PRN    [provider]  ciprofloxacin (CIPRO) 250 MG tablet Take 1 tablet 2 x /day with Food for Infection Patient not taking: Reported on 11/14/2022 11/01/22   Adela Glimpse, NP  COMFORT EZ PEN NEEDLES 32G X 4 MM MISC USE 2 TIMES DAILY AS DIRECTED , 1 PEN NEEDLE PER INJECTION. 01/14/19   Lucky Cowboy, MD  Continuous Glucose Sensor (FREESTYLE LIBRE SENSOR SYSTEM) MISC Check blood sugar 3 times a day. Dx:E11.8, Z79.4 11/14/22   Adela Glimpse, NP  Cyanocobalamin (B-12) 2500 MCG TABS Take by mouth daily.    [provider]  docusate sodium (COLACE) 100 MG capsule Take 1 capsule twice daily as needed for constipation with a bottle of water. 08/26/21   Judd Gaudier, NP  dorzolamide-timolol (COSOPT) 22.3-6.8 MG/ML ophthalmic solution Place 1 drop into both eyes 2 (two) times daily. Patient not taking: Reported on 11/10/2022 03/31/16   [provider]  EASY COMFORT INSULIN SYRINGE 30G X 1/2" 0.5 ML MISC  10/01/15   [provider]  fluconazole (DIFLUCAN) 150 MG tablet Take 1 tablet (150 mg) at the sign of symptoms.  If not resolved after 72 hours may take additional 150 mg dosage. Patient not taking: Reported on 11/01/2022 09/22/22   Adela Glimpse, NP  glucose blood (ONETOUCH VERIO) test strip USE 1 STRIP TO CHECK GLUCOSE THREE TIMES DAILY AS DIRECTED 05/23/22   Adela Glimpse, NP  Insulin Glargine (BASAGLAR KWIKPEN) 100 UNIT/ML DIAL AND INJECT 30 UNITS UNDER THE SKIN ONCE DAILY AS DIRECTED Patient not taking: Reported on 11/10/2022 02/22/22   Raynelle Dick, NP  insulin lispro (HUMALOG) 100 UNIT/ML injection Inject 5-10 Units into the skin 3 (three) times daily with meals. Per sliding scale 02/27/17   [provider]  ipratropium (ATROVENT) 0.03 % nasal spray Place 2 sprays into the nose 3 (three)  times daily. 06/22/21 09/22/22  Raynelle Dick, NP  Lancet Devices (ADJUSTABLE LANCING DEVICE) MISC CHECK BLOOD SUGAR 3 TIMES A DAY 09/07/20   Lucky Cowboy, MD  Lancets MISC Use to test blood sugar three times a day 04/06/22   Adela Glimpse, NP  latanoprost (XALATAN) 0.005 % ophthalmic solution Place 1 drop into both eyes at bedtime. Patient not taking: Reported on 11/10/2022 03/28/16   [provider]  linaclotide Karlene Einstein) 72 MCG capsule Start taking 1 cap daily for constipation. Patient not taking: Reported on 11/10/2022 10/19/21   Judd Gaudier, NP  meclizine (ANTIVERT) 25 MG tablet Take 0.5-1 tablets (12.5-25 mg total) by mouth 3 (three) times daily as needed for dizziness. Patient not taking: Reported on 11/10/2022 12/10/21   Terrilee Files, MD  Melatonin 10 MG TABS Take by mouth at bedtime.    [provider]  metFORMIN (GLUCOPHAGE-XR) 500 MG 24 hr tablet Take 1 tablet 1 x /day with Meals for Diabetes. 11/14/22   Adela Glimpse, NP  Multiple Vitamins-Minerals (CENTRUM SILVER 50+WOMEN) TABS Take 1 tablet by mouth daily.    [provider]  Multiple Vitamins-Minerals (HAIR SKIN AND NAILS FORMULA PO) Take by mouth daily.    [provider]  rosuvastatin (CRESTOR) 20 MG tablet Take   1 tablet  Daily for Cholesterol                                                                 /                                                          TAKE  BY                                        MOUTH                                      ONCE ? DAILY Patient taking differently: Take 20 mg by mouth daily. 08/26/22   Lucky Cowboy, MD  traMADol (ULTRAM) 50 MG tablet Take 50 mg by mouth every 6 (six) hours as needed for moderate pain. Pt only takes 2 pills per night    [provider]  triamcinolone (KENALOG) 0.025 % ointment Apply 1 Application topically 2 (two) times daily. 11/14/22   Adela Glimpse, NP  Zinc 50 MG  TABS Take by mouth as needed.    [provider]      Allergies    Ciprofloxacin and Morphine and codeine    Review of Systems   Review of Systems  All other systems reviewed and are negative.   Physical Exam Updated Vital Signs BP 136/74 (BP Location: Right Arm)   Pulse 65   Temp 98.2 F (36.8 C) (Oral)   Resp 15   SpO2 100%  Physical Exam Vitals and nursing note reviewed.  Constitutional:      General: She is not in acute distress.    Appearance: She is well-developed.  HENT:     Head: Atraumatic.     Mouth/Throat:     Mouth: Mucous membranes are moist.  Eyes:     Conjunctiva/sclera: Conjunctivae normal.  Cardiovascular:     Rate and Rhythm: Normal rate and regular rhythm.     Pulses: Normal pulses.     Heart sounds: Normal heart sounds.  Pulmonary:     Effort: Pulmonary effort is normal.  Abdominal:     Palpations: Abdomen is soft.     Tenderness: There is no abdominal tenderness.  Musculoskeletal:     Cervical back: Neck supple.     Comments: Equal strength all 4 extremities.  Skin:    Findings: No rash.  Neurological:     Mental Status: She is alert and oriented to person, place, and time. Mental status is at baseline.  Psychiatric:        Mood and Affect: Mood normal.     ED Results / Procedures / Treatments   Labs (all labs ordered are listed, but only abnormal results are displayed) Labs Reviewed  CBC - Abnormal; Notable for the following components:      Result Value   WBC 11.8 (*)    RBC 3.70 (*)    HCT 35.9 (*)    All other components within normal limits  URINALYSIS, ROUTINE W REFLEX MICROSCOPIC - Abnormal; Notable for the following components:   Glucose, UA >=500 (*)    Hgb urine dipstick SMALL (*)    Leukocytes,Ua SMALL (*)    All other components within normal limits  URINALYSIS, MICROSCOPIC (REFLEX) - Abnormal; Notable for the following components:   Bacteria, UA RARE (*)    All other components within normal limits  CBG  MONITORING, ED - Abnormal; Notable for the following components:   Glucose-Capillary 368 (*)    All other components within normal limits  I-STAT CHEM 8, ED - Abnormal; Notable for the following components:   BUN 45 (*)  Creatinine, Ser 1.70 (*)    Glucose, Bld 379 (*)    All other components within normal limits  CBG MONITORING, ED - Abnormal; Notable for the following components:   Glucose-Capillary 327 (*)    All other components within normal limits  BETA-HYDROXYBUTYRIC ACID  I-STAT VENOUS BLOOD GAS, ED    EKG None  Radiology No results found.  Procedures Procedures    Medications Ordered in ED Medications  sodium chloride 0.9 % bolus 1,000 mL (has no administration in time range)  insulin aspart (novoLOG) injection 5 Units (has no administration in time range)    ED Course/ Medical Decision Making/ A&P                                 Medical Decision Making Amount and/or Complexity of Data Reviewed Labs: ordered. ECG/medicine tests: ordered.  Risk Prescription drug management.   BP (!) 160/54 (BP Location: Right Arm)   Pulse (!) 53   Temp 98.2 F (36.8 C) (Oral)   Resp 16   SpO2 100%   50:29 PM 89 year old female significant history of diabetes, hypertension, hypercholesterolemia, chronic back pain, frequent UTI presents ED with concerns of elevated blood sugar.  History obtained through patient and through family member who is at bedside.  For the past 2 days patient appears to be weaker than usual more fatigued, and having increased thirst.  Family member voiced concerns for potential urinary tract infection as she exhibits similar symptoms when she had urinary tract infection in the past.  Patient however is without any complaints.  She denies having any headache chest pain shortness of breath abdominal pain back pain burning urination focal numbness or focal weakness.  She endorsed normal appetite.  No recent change in her medication except metformin a  month ago.  Daughter noticed that patient is unsteady on her feet, and has strong urine odor.  On exam this is an elderly female resting company in bed appears to be in no acute discomfort.  She does not have any focal neurodeficit.  Heart with normal rate and rhythm, lungs are clear to auscultation bilaterally abdomen is soft nontender she does not appear to be dehydrated.  She is mentating appropriately alert and oriented x 3.  -Labs ordered, independently viewed and interpreted by me.  Labs remarkable for 327, no anion gap.  Awaits UA.  Pt given 1L NS and 5 unit of insulin.  Need to assess for potential DKA.   -The patient was maintained on a cardiac monitor.  I personally viewed and interpreted the cardiac monitored which showed an underlying rhythm of: sinus brady -Imaging not obtained -This patient presents to the ED for concern of weakness, this involves an extensive number of treatment options, and is a complaint that carries with it a high risk of complications and morbidity.  The differential diagnosis includes UTI, DKA, HHS, electrolytes imbalance -Co morbidities that complicate the patient evaluation includes DM, HTN, CKD, frequent UTI -Treatment includes IVF, insuline -Reevaluation of the patient after these medicines showed that the patient improved -PCP office notes or outside notes reviewed -Discussion with oncoming team -Escalation to admission/observation considered: dispo pending        Final Clinical Impression(s) / ED Diagnoses Final diagnoses:  None    Rx / DC Orders ED Discharge Orders     None         Fayrene Helper, PA-C 01/08/23 1506  Royanne Foots, DO 01/09/23 [phone]

## 2023-01-08 NOTE — ED Provider Notes (Signed)
Pending UA, r/o dka. Weakness past couple of days Physical Exam  BP (!) 149/59   Pulse (!) 53   Temp 98.2 F (36.8 C) (Oral)   Resp 18   SpO2 99%   Physical Exam Vitals and nursing note reviewed.  Constitutional:      Appearance: Normal appearance.     Comments: Comfortable appearing, alert and oriented  HENT:     Head: Atraumatic.  Cardiovascular:     Rate and Rhythm: Normal rate and regular rhythm.  Pulmonary:     Effort: Pulmonary effort is normal.  Neurological:     General: No focal deficit present.     Mental Status: She is alert.  Psychiatric:        Mood and Affect: Mood normal.        Behavior: Behavior normal.     Procedures  Procedures  ED Course / MDM    Medical Decision Making Amount and/or Complexity of Data Reviewed Labs: ordered. Radiology: ordered. ECG/medicine tests: ordered.  Risk Prescription drug management.   I received this patient as handoff from Fayrene Helper PA-C.  In short, this patient has been experiencing generalized fatigue over the last couple of days.  Per history gathered from her and her son at bedside, she has been more fatigued but does not have any fever or chills.  Denies any other complaints.  They state this is similar to last time she had a UTI.  Vital signs within normal limits.  Workup showed an elevated CBG at 327, 5 units of insulin and a NS bolus was initiated by the previous provider.  CBC only with slight leukocytosis of 11.8.  No signs of DKA-no acidosis or elevated beta hydroxybutyric acid.  CT head was obtained due to the new weakness, which was reviewed and interpreted by me and did not show any acute findings.  EKG reviewed by me which shows normal sinus rhythm.  UTI did show positive nitrites and she does endorse increased urinary frequency.  Denies any dysuria or hematuria.  Given urinary symptoms and the positive nitrites, will treat with 7-day course of Keflex.  Patient without any CVA tenderness, fever, low suspicion  for pyelonephritis.   Patient appropriate for discharge home at this time.  Advised that she follow-up with her PCP within the next 2 weeks for symptom recheck and to discuss further blood sugar control.  Strict return precautions given      Arabella Merles, PA-C 01/08/23 1703    Anders Simmonds T, DO 01/13/23 [phone]

## 2023-01-08 NOTE — Discharge Instructions (Addendum)
It was a pleasure taking care of you today  Your urine results showed that you have a UTI.  You have been prescribed Keflex. Take this antibiotic 4 times a day for the next 7 days. Take the full course of your antibiotic even if you start feeling better. Antibiotics may cause you to have diarrhea.  Please continue to keep well hydrated with water.  Your blood sugars were slightly high here today.  Please follow-up with your PCP within the next 2 weeks to discuss management of your diabetes.  Your other labwork was reassuring and your CT head did not show any acute abnormalities.  Return to the ER if your symptoms worsen, you develop unexplained fever or chills, you develop back or abdominal pain, any nausea or vomiting, any other new or concerning symptoms.

## 2023-01-17 DIAGNOSIS — M47816 Spondylosis without myelopathy or radiculopathy, lumbar region: Secondary | ICD-10-CM | POA: Diagnosis not present

## 2023-01-18 ENCOUNTER — Ambulatory Visit (INDEPENDENT_AMBULATORY_CARE_PROVIDER_SITE_OTHER): Payer: Medicare Other | Admitting: Nurse Practitioner

## 2023-01-18 ENCOUNTER — Encounter: Payer: Self-pay | Admitting: Nurse Practitioner

## 2023-01-18 ENCOUNTER — Ambulatory Visit: Payer: Medicare Other | Admitting: Nurse Practitioner

## 2023-01-18 VITALS — BP 140/62 | HR 56 | Temp 97.8°F | Ht 63.0 in | Wt 111.2 lb

## 2023-01-18 DIAGNOSIS — I10 Essential (primary) hypertension: Secondary | ICD-10-CM | POA: Diagnosis not present

## 2023-01-18 DIAGNOSIS — R350 Frequency of micturition: Secondary | ICD-10-CM | POA: Diagnosis not present

## 2023-01-18 DIAGNOSIS — R300 Dysuria: Secondary | ICD-10-CM

## 2023-01-18 MED ORDER — NITROFURANTOIN MONOHYD MACRO 100 MG PO CAPS
100.0000 mg | ORAL_CAPSULE | Freq: Two times a day (BID) | ORAL | 0 refills | Status: AC
Start: 2023-01-18 — End: 2023-01-23

## 2023-01-18 NOTE — Progress Notes (Signed)
Pt presents with generalized fatigue and vague symptoms necessitating in checking viral panel including covid as a possible cause.

## 2023-01-18 NOTE — Patient Instructions (Signed)
Urinary Tract Infection, Adult A urinary tract infection (UTI) is an infection of any part of the urinary tract. The urinary tract includes: The kidneys. The ureters. The bladder. The urethra. These organs make, store, and get rid of pee (urine) in the body. What are the causes? This infection is caused by germs (bacteria) in your genital area. These germs grow and cause swelling (inflammation) of your urinary tract. What increases the risk? The following factors may make you more likely to develop this condition: Using a small, thin tube (catheter) to drain pee. Not being able to control when you pee or poop (incontinence). Being female. If you are female, these things can increase the risk: Using these methods to prevent pregnancy: A medicine that kills sperm (spermicide). A device that blocks sperm (diaphragm). Having low levels of a female hormone (estrogen). Being pregnant. You are more likely to develop this condition if: You have genes that add to your risk. You are sexually active. You take antibiotic medicines. You have trouble peeing because of: A prostate that is bigger than normal, if you are female. A blockage in the part of your body that drains pee from the bladder. A kidney stone. A nerve condition that affects your bladder. Not getting enough to drink. Not peeing often enough. You have other conditions, such as: Diabetes. A weak disease-fighting system (immune system). Sickle cell disease. Gout. Injury of the spine. What are the signs or symptoms? Symptoms of this condition include: Needing to pee right away. Peeing small amounts often. Pain or burning when peeing. Blood in the pee. Pee that smells bad or not like normal. Trouble peeing. Pee that is cloudy. Fluid coming from the vagina, if you are female. Pain in the belly or lower back. Other symptoms include: Vomiting. Not feeling hungry. Feeling mixed up (confused). This may be the first symptom in  older adults. Being tired and grouchy (irritable). A fever. Watery poop (diarrhea). How is this treated? Taking antibiotic medicine. Taking other medicines. Drinking enough water. In some cases, you may need to see a specialist. Follow these instructions at home:  Medicines Take over-the-counter and prescription medicines only as told by your doctor. If you were prescribed an antibiotic medicine, take it as told by your doctor. Do not stop taking it even if you start to feel better. General instructions Make sure you: Pee until your bladder is empty. Do not hold pee for a long time. Empty your bladder after sex. Wipe from front to back after peeing or pooping if you are a female. Use each tissue one time when you wipe. Drink enough fluid to keep your pee pale yellow. Keep all follow-up visits. Contact a doctor if: You do not get better after 1-2 days. Your symptoms go away and then come back. Get help right away if: You have very bad back pain. You have very bad pain in your lower belly. You have a fever. You have chills. You feeling like you will vomit or you vomit. Summary A urinary tract infection (UTI) is an infection of any part of the urinary tract. This condition is caused by germs in your genital area. There are many risk factors for a UTI. Treatment includes antibiotic medicines. Drink enough fluid to keep your pee pale yellow. This information is not intended to replace advice given to you by your health care provider. Make sure you discuss any questions you have with your health care provider. Document Revised: 11/17/2019 Document Reviewed: 11/22/2019 Elsevier Patient Education    2024 [employer]  

## 2023-01-18 NOTE — Progress Notes (Signed)
Assessment and Plan:  URSULINE ROTON was seen today for an episodic visit.  Diagnoses and all order for this visit:  Dysuria/Urinary frequency Discussed wiping from front to back to decrease any fecal matter entering vagina. Stay well hydrated to keep urinary system well flushed Consider daily cranberry juice or oral supplement to help any bacteria from adhering to bladder wall causing increase for infection. Monitor for any increase in fever, chills, N/V, abdominal pain, hematuria.   Contact office or report to ER for further evaluation if s/s fail to improve or any sign of worsening infection as noted above.  - Urinalysis, Routine w reflex microscopic - Urine Culture - nitrofurantoin, macrocrystal-monohydrate, (MACROBID) 100 MG capsule; Take 1 capsule (100 mg total) by mouth 2 (two) times daily for 5 days.  Dispense: 10 capsule; Refill: 0  Hypertension Continue medications as directed Stay well hydrated Discussed DASH (Dietary Approaches to Stop Hypertension) DASH diet is lower in sodium than a typical American diet. Cut back on foods that are high in saturated fat, cholesterol, and trans fats. Eat more whole-grain foods, fish, poultry, and nuts Remain active and exercise as tolerated daily.    Notify office for further evaluation and treatment, questions or concerns if s/s fail to improve. The risks and benefits of my recommendations, as well as other treatment options were discussed with the patient today. Questions were answered.  Further disposition pending results of labs. Discussed med's effects and SE's.    Over 15 minutes of exam, counseling, chart review, and critical decision making was performed.   Future Appointments  Date Time Provider Department Center  02/21/2023 11:30 AM [facility] NURSE [facility] None    ------------------------------------------------------------------------------------------------------------------   HPI BP (!) 140/62   Pulse (!) 56    Temp 97.8 F (36.6 C)   Ht 5\' 3"  (1.6 m)   Wt 111 lb 3.2 oz (50.4 kg)   SpO2 98%   BMI 19.70 kg/m    Patient complains of dysuria and frequency. She has had symptoms for 3 days. Feels as though it could be related to not wiping correctly.  Patient denies fever, stomach ache, and vaginal discharge. Patient does have a history of recurrent UTI. Patient does not have a history of pyelonephritis. Reports Ciprofloxacin causes her to feel more confused.     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 Swallow whole.   bisoprolol-hydrochlorothiazide (ZIAC) 2.5-6.25 MG tablet Take  1 tablet  Daily  for BP                                /  TAKE                                         BY                                                 MOUTH   Blood Glucose Calibration (ACCU-CHEK AVIVA) SOLN Check blood sugar 3 times daily-DX-E11.22   Blood Glucose Monitoring Suppl DEVI Use to test blood sugar three times a day   Cholecalciferol (VITAMIN D3) 2000 UNITS TABS Take 1 capsule by mouth 2 (two) times daily. PRN   ciprofloxacin (CIPRO) 250 MG tablet Take 1 tablet 2 x /day with Food for Infection   COMFORT EZ PEN NEEDLES 32G X 4 MM MISC USE 2 TIMES  DAILY AS DIRECTED , 1 PEN NEEDLE PER INJECTION.   Continuous Glucose Sensor (FREESTYLE LIBRE SENSOR SYSTEM) MISC Check blood sugar 3 times a day. Dx:E11.8, Z79.4   Cyanocobalamin (B-12) 2500 MCG TABS Take by mouth daily.   docusate sodium (COLACE) 100 MG capsule Take 1 capsule twice daily as needed for constipation with a bottle of water.   dorzolamide-timolol (COSOPT) 22.3-6.8 MG/ML ophthalmic solution Place 1 drop into both eyes 2 (two) times daily.   EASY COMFORT INSULIN SYRINGE 30G X 1/2" 0.5 ML MISC    fluconazole (DIFLUCAN) 150 MG tablet Take 1 tablet (150 mg) at the sign of symptoms.  If not resolved after 72 hours may take additional 150 mg dosage.   glucose blood (ONETOUCH VERIO) test strip USE 1 STRIP TO CHECK GLUCOSE THREE TIMES DAILY AS DIRECTED   Insulin Glargine (BASAGLAR KWIKPEN) 100 UNIT/ML DIAL AND INJECT 30 UNITS UNDER THE SKIN ONCE DAILY AS DIRECTED   insulin lispro (HUMALOG) 100 UNIT/ML injection Inject 5-10 Units into the skin 3 (three) times daily with meals. Per sliding scale   Lancet Devices (ADJUSTABLE LANCING DEVICE) MISC CHECK BLOOD SUGAR 3 TIMES A DAY   Lancets MISC Use to test blood sugar three times a day   latanoprost (XALATAN) 0.005 % ophthalmic solution Place 1 drop into both eyes at bedtime.   linaclotide (LINZESS) 72 MCG capsule Start taking 1 cap daily for constipation.   meclizine (ANTIVERT) 25 MG tablet Take 0.5-1 tablets (12.5-25 mg total) by mouth 3 (three) times daily as needed for dizziness.   Melatonin 10 MG TABS Take by mouth at bedtime.   metFORMIN (GLUCOPHAGE-XR) 500 MG 24 hr tablet Take 1 tablet 1 x /day with Meals for Diabetes.   Multiple Vitamins-Minerals (CENTRUM SILVER 50+WOMEN) TABS Take 1 tablet by mouth daily.   Multiple Vitamins-Minerals (HAIR SKIN AND NAILS FORMULA PO) Take by mouth daily.   rosuvastatin (CRESTOR) 20 MG tablet Take   1 tablet  Daily for Cholesterol                                                                 /  TAKE                                              BY                                        MOUTH                                      ONCE ? DAILY (Patient taking differently: Take 20 mg by mouth daily.)   traMADol (ULTRAM) 50 MG tablet Take 50 mg by mouth every 6 (six) hours as needed for moderate pain. Pt only takes 2 pills per night   triamcinolone (KENALOG) 0.025 % ointment Apply 1 Application topically 2 (two) times daily.   Zinc 50 MG TABS Take by mouth as needed.   ipratropium (ATROVENT) 0.03 % nasal spray Place 2 sprays into the nose 3 (three) times daily.   No current facility-administered medications on file prior to visit.    ROS: all negative except what is noted in the HPI.   Physical Exam:  BP (!) 140/62   Pulse (!) 56   Temp 97.8 F (36.6 C)   Ht 5\' 3"  (1.6 m)   Wt 111 lb 3.2 oz (50.4 kg)   SpO2 98%   BMI 19.70 kg/m   General Appearance: NAD.  Awake, conversant and cooperative. Eyes: PERRLA, EOMs intact.  Sclera white.  Conjunctiva without erythema. Sinuses: No frontal/maxillary tenderness.  No nasal discharge. Nares patent.  ENT/Mouth: Ext aud canals clear.  Bilateral TMs w/DOL and without erythema or bulging. Hearing intact.  Posterior pharynx without swelling or exudate.  Tonsils without swelling or erythema.  Neck: Supple.  No masses, nodules or thyromegaly. Respiratory: Effort is regular with non-labored breathing. Breath sounds are equal bilaterally without rales, rhonchi, wheezing or stridor.  Cardio: RRR with no MRGs. Brisk peripheral pulses without edema.  Abdomen: Active BS in all four quadrants.  Soft and non-tender without guarding, rebound tenderness, hernias or masses. Lymphatics: Non tender without lymphadenopathy.  Musculoskeletal: Full ROM, 5/5 strength, normal ambulation.  No clubbing or cyanosis. Skin: Appropriate color for ethnicity. Warm without rashes, lesions, ecchymosis, ulcers.  Neuro: CN II-XII  grossly normal. Normal muscle tone without cerebellar symptoms and intact sensation.   Psych: AO X 3,  appropriate mood and affect, insight and judgment.     Adela Glimpse, NP 3:13 PM [facility]

## 2023-01-19 LAB — URINALYSIS, ROUTINE W REFLEX MICROSCOPIC
Bacteria, UA: NONE SEEN /HPF
Bilirubin Urine: NEGATIVE
Hgb urine dipstick: NEGATIVE
Hyaline Cast: NONE SEEN /LPF
Ketones, ur: NEGATIVE
Nitrite: NEGATIVE
Protein, ur: NEGATIVE
RBC / HPF: NONE SEEN /HPF (ref 0–2)
Specific Gravity, Urine: 1.024 (ref 1.001–1.035)
pH: 5.5 (ref 5.0–8.0)

## 2023-01-19 LAB — URINE CULTURE
MICRO NUMBER:: 15516807
SPECIMEN QUALITY:: ADEQUATE

## 2023-01-19 LAB — MICROSCOPIC MESSAGE

## 2023-01-23 ENCOUNTER — Other Ambulatory Visit: Payer: Self-pay

## 2023-01-23 ENCOUNTER — Encounter (HOSPITAL_COMMUNITY): Payer: Self-pay

## 2023-01-23 ENCOUNTER — Emergency Department (HOSPITAL_COMMUNITY)
Admission: EM | Admit: 2023-01-23 | Discharge: 2023-01-24 | Disposition: A | Discharge status: Home / Self Care | Payer: Medicare Other | Attending: Emergency Medicine | Admitting: Emergency Medicine

## 2023-01-23 ENCOUNTER — Ambulatory Visit: Payer: Medicare Other | Admitting: Nurse Practitioner

## 2023-01-23 DIAGNOSIS — Z79899 Other long term (current) drug therapy: Secondary | ICD-10-CM | POA: Diagnosis not present

## 2023-01-23 DIAGNOSIS — E119 Type 2 diabetes mellitus without complications: Secondary | ICD-10-CM | POA: Insufficient documentation

## 2023-01-23 DIAGNOSIS — R300 Dysuria: Secondary | ICD-10-CM | POA: Insufficient documentation

## 2023-01-23 DIAGNOSIS — Z7982 Long term (current) use of aspirin: Secondary | ICD-10-CM | POA: Insufficient documentation

## 2023-01-23 DIAGNOSIS — N390 Urinary tract infection, site not specified: Secondary | ICD-10-CM | POA: Diagnosis not present

## 2023-01-23 DIAGNOSIS — I10 Essential (primary) hypertension: Secondary | ICD-10-CM | POA: Insufficient documentation

## 2023-01-23 DIAGNOSIS — R7402 Elevation of levels of lactic acid dehydrogenase (LDH): Secondary | ICD-10-CM | POA: Diagnosis not present

## 2023-01-23 DIAGNOSIS — Z1152 Encounter for screening for COVID-19: Secondary | ICD-10-CM | POA: Diagnosis not present

## 2023-01-23 DIAGNOSIS — Z794 Long term (current) use of insulin: Secondary | ICD-10-CM | POA: Diagnosis not present

## 2023-01-23 DIAGNOSIS — D72829 Elevated white blood cell count, unspecified: Secondary | ICD-10-CM | POA: Insufficient documentation

## 2023-01-23 DIAGNOSIS — Z7984 Long term (current) use of oral hypoglycemic drugs: Secondary | ICD-10-CM | POA: Diagnosis not present

## 2023-01-23 LAB — BASIC METABOLIC PANEL
Anion gap: 19 — ABNORMAL HIGH (ref 5–15)
BUN: 42 mg/dL — ABNORMAL HIGH (ref 8–23)
CO2: 25 mmol/L (ref 22–32)
Calcium: 9.5 mg/dL (ref 8.9–10.3)
Chloride: 94 mmol/L — ABNORMAL LOW (ref 98–111)
Creatinine, Ser: 1.52 mg/dL — ABNORMAL HIGH (ref 0.44–1.00)
GFR, Estimated: 32 mL/min — ABNORMAL LOW (ref >60)
Glucose, Bld: 274 mg/dL — ABNORMAL HIGH (ref 70–99)
Potassium: 4.2 mmol/L (ref 3.5–5.1)
Sodium: 138 mmol/L (ref 135–145)

## 2023-01-23 LAB — CBC
HCT: 35.3 % — ABNORMAL LOW (ref 36.0–46.0)
Hemoglobin: 11.3 g/dL — ABNORMAL LOW (ref 12.0–15.0)
MCH: 30.9 pg (ref 26.0–34.0)
MCHC: 32 g/dL (ref 30.0–36.0)
MCV: 96.4 fL (ref 80.0–100.0)
Platelets: 233 10*3/uL (ref 150–400)
RBC: 3.66 MIL/uL — ABNORMAL LOW (ref 3.87–5.11)
RDW: 12.8 % (ref 11.5–15.5)
WBC: 11.4 10*3/uL — ABNORMAL HIGH (ref 4.0–10.5)
nRBC: 0 % (ref 0.0–0.2)

## 2023-01-23 LAB — SARS CORONAVIRUS 2 BY RT PCR: SARS Coronavirus 2 by RT PCR: NEGATIVE

## 2023-01-23 NOTE — ED Triage Notes (Signed)
Pt c/o dysuria, urine frequency, weaknessx3d. Pt's son states glucose was 425, but this morning is was 130.

## 2023-01-23 NOTE — ED Provider Triage Note (Signed)
Emergency Medicine Provider Triage Evaluation Note  [name] , a 89 y.o. female  was evaluated in triage.  Pt complains of urinary frequency, generalized weakness for the past 2 to 3 days.  Patient states that she just finished Macrobid yesterday for treatment of UTI by her primary care.  Denies any fever, nausea, vomiting, abdominal pain, cough, congestion, change in bowel habits.  Review of Systems  Positive: See above Negative:   Physical Exam  BP (!) 158/101 (BP Location: Right Arm)   Pulse (!) 57   Temp 98 F (36.7 C) (Oral)   Resp 16   Ht 5\' 3"  (1.6 m)   Wt 50.4 kg   SpO2 100%   BMI 19.68 kg/m  Gen:   Awake, no distress   Resp:  Normal effort  MSK:   Moves extremities without difficulty  Other:  No abdominal tenderness  Medical Decision Making  Medically screening exam initiated at 5:41 PM.  Appropriate orders placed.  [name] was informed that the remainder of the evaluation will be completed by another provider, this initial triage assessment does not replace that evaluation, and the importance of remaining in the ED until their evaluation is complete.     Peter Garter, [place] 01/23/23 [phone]

## 2023-01-24 ENCOUNTER — Emergency Department (HOSPITAL_COMMUNITY): Payer: Medicare Other

## 2023-01-24 DIAGNOSIS — N390 Urinary tract infection, site not specified: Secondary | ICD-10-CM | POA: Diagnosis not present

## 2023-01-24 DIAGNOSIS — R300 Dysuria: Secondary | ICD-10-CM | POA: Diagnosis not present

## 2023-01-24 LAB — I-STAT CG4 LACTIC ACID, ED: Lactic Acid, Venous: 2 mmol/L (ref 0.5–1.9)

## 2023-01-24 LAB — URINALYSIS, ROUTINE W REFLEX MICROSCOPIC
Bilirubin Urine: NEGATIVE
Glucose, UA: >=500 mg/dL — AB
Hgb urine dipstick: NEGATIVE
Ketones, ur: NEGATIVE mg/dL
Nitrite: NEGATIVE
Protein, ur: NEGATIVE mg/dL
Specific Gravity, Urine: 1.008 (ref 1.005–1.030)
pH: 6 (ref 5.0–8.0)

## 2023-01-24 LAB — CBG MONITORING, ED: Glucose-Capillary: 377 mg/dL — ABNORMAL HIGH (ref 70–99)

## 2023-01-24 MED ORDER — SODIUM CHLORIDE 0.9 % IV BOLUS
500.0000 mL | Freq: Once | INTRAVENOUS | Status: AC
  Administered 2023-01-24: 500 mL via INTRAVENOUS

## 2023-01-24 NOTE — ED Notes (Signed)
Micro called to add on urine culture

## 2023-01-24 NOTE — Discharge Instructions (Signed)
You were seen today for ongoing dysuria.  Your workup today is largely reassuring.  Urine culture is pending.  I would not start you on antibiotics at this time given your increased risk for resistance.  If you are having ongoing symptoms, you should follow-up with urology.  If your urine culture grows anything out, you will receive a phone call.

## 2023-01-24 NOTE — ED Provider Notes (Signed)
[place] EMERGENCY DEPARTMENT AT [facility] Provider Note   CSN: [id number] Arrival date & time: 01/23/23  1641     History  Chief Complaint  Patient presents with   Recurrent UTI    [name] is a 89 y.o. female.  HPI     This is a 89 year old female who presents with persistent dysuria and frequency.  Patient reports that she has had recurrent urinary tract infections throughout the summer.  She was most recently seen by her primary physician and started on Macrobid on 9/25.  She took her final dose yesterday morning.  She has ongoing dysuria and frequency.  They have noted that her blood sugars have been elevated especially at night.  She has not had any fevers.  Has baseline back pain but no lateralizing back pain.  Does report some lower abdominal discomfort.  Urine culture from 9/25 in the office shows less than 10,000 colonies of gram-negative organism isolated.    Home Medications Prior to Admission medications   Medication Sig Start Date End Date Taking? Authorizing Provider  aspirin EC 81 MG tablet Take 81 mg by mouth daily. Swallow whole.    [provider]  bisoprolol-hydrochlorothiazide [facility]) 2.5-6.25 MG tablet Take  1 tablet  Daily  for BP                                /                                                                   TAKE                                         BY                                                 MOUTH 01/07/23   Lucky Cowboy, MD  Blood Glucose Calibration (ACCU-CHEK AVIVA) SOLN Check blood sugar 3 times daily-DX-E11.22 03/11/15   Lucky Cowboy, MD  Blood Glucose Monitoring Suppl DEVI Use to test blood sugar three times a day 04/06/22   Adela Glimpse, NP  Cholecalciferol (VITAMIN D3) 2000 UNITS TABS Take 1 capsule by mouth 2 (two) times daily. PRN    [provider]  COMFORT EZ PEN NEEDLES 32G X 4 MM MISC USE 2 TIMES DAILY AS DIRECTED , 1 PEN NEEDLE PER INJECTION. 01/14/19   Lucky Cowboy,  MD  Continuous Glucose Sensor (FREESTYLE LIBRE SENSOR SYSTEM) MISC Check blood sugar 3 times a day. Dx:E11.8, Z79.4 11/14/22   Adela Glimpse, NP  Cyanocobalamin (B-12) 2500 MCG TABS Take by mouth daily.    [provider]  docusate sodium (COLACE) 100 MG capsule Take 1 capsule twice daily as needed for constipation with a bottle of water. 08/26/21   Judd Gaudier, NP  dorzolamide-timolol (COSOPT) 22.3-6.8 MG/ML ophthalmic solution Place 1 drop into both eyes 2 (two) times daily. 03/31/16   [provider]  EASY  COMFORT INSULIN SYRINGE 30G X 1/2" 0.5 ML MISC  10/01/15   [provider]  fluconazole (DIFLUCAN) 150 MG tablet Take 1 tablet (150 mg) at the sign of symptoms.  If not resolved after 72 hours may take additional 150 mg dosage. 09/22/22   Adela Glimpse, NP  glucose blood (ONETOUCH VERIO) test strip USE 1 STRIP TO CHECK GLUCOSE THREE TIMES DAILY AS DIRECTED 05/23/22   Adela Glimpse, NP  Insulin Glargine (BASAGLAR KWIKPEN) 100 UNIT/ML DIAL AND INJECT 30 UNITS UNDER THE SKIN ONCE DAILY AS DIRECTED 02/22/22   Raynelle Dick, NP  insulin lispro (HUMALOG) 100 UNIT/ML injection Inject 5-10 Units into the skin 3 (three) times daily with meals. Per sliding scale 02/27/17   [provider]  ipratropium (ATROVENT) 0.03 % nasal spray Place 2 sprays into the nose 3 (three) times daily. 06/22/21 09/22/22  Raynelle Dick, NP  Lancet Devices (ADJUSTABLE LANCING DEVICE) MISC CHECK BLOOD SUGAR 3 TIMES A DAY 09/07/20   Lucky Cowboy, MD  Lancets MISC Use to test blood sugar three times a day 04/06/22   Adela Glimpse, NP  latanoprost (XALATAN) 0.005 % ophthalmic solution Place 1 drop into both eyes at bedtime. 03/28/16   [provider]  linaclotide Karlene Einstein) 72 MCG capsule Start taking 1 cap daily for constipation. 10/19/21   Judd Gaudier, NP  meclizine (ANTIVERT) 25 MG tablet Take 0.5-1 tablets (12.5-25 mg total) by mouth 3 (three) times daily as needed for  dizziness. 12/10/21   Terrilee Files, MD  Melatonin 10 MG TABS Take by mouth at bedtime.    [provider]  metFORMIN (GLUCOPHAGE-XR) 500 MG 24 hr tablet Take 1 tablet 1 x /day with Meals for Diabetes. 11/14/22   Adela Glimpse, NP  Multiple Vitamins-Minerals (CENTRUM SILVER 50+WOMEN) TABS Take 1 tablet by mouth daily.    [provider]  Multiple Vitamins-Minerals (HAIR SKIN AND NAILS FORMULA PO) Take by mouth daily.    [provider]  rosuvastatin (CRESTOR) 20 MG tablet Take   1 tablet  Daily for Cholesterol                                                                 /                                                          TAKE                                              BY                                        MOUTH  ONCE ? DAILY Patient taking differently: Take 20 mg by mouth daily. 08/26/22   Lucky Cowboy, MD  traMADol (ULTRAM) 50 MG tablet Take 50 mg by mouth every 6 (six) hours as needed for moderate pain. Pt only takes 2 pills per night    [provider]  triamcinolone (KENALOG) 0.025 % ointment Apply 1 Application topically 2 (two) times daily. 11/14/22   Adela Glimpse, NP  Zinc 50 MG TABS Take by mouth as needed.    [provider]      Allergies    Ciprofloxacin and Morphine and codeine    Review of Systems   Review of Systems  Constitutional:  Negative for fever.  Gastrointestinal:  Positive for abdominal pain. Negative for nausea and vomiting.  Genitourinary:  Positive for dysuria and frequency.  All other systems reviewed and are negative.   Physical Exam Updated Vital Signs BP (!) 174/76   Pulse (!) 55   Temp 97.9 F (36.6 C) (Oral)   Resp 16   Ht 1.6 m (5\' 3" )   Wt 50.4 kg   SpO2 95%   BMI 19.68 kg/m  Physical Exam Vitals and nursing note reviewed.  Constitutional:      Appearance: She is well-developed. She is not ill-appearing.  HENT:     Head: Normocephalic and  atraumatic.  Eyes:     Pupils: Pupils are equal, round, and reactive to light.  Cardiovascular:     Rate and Rhythm: Normal rate and regular rhythm.     Heart sounds: Normal heart sounds.  Pulmonary:     Effort: Pulmonary effort is normal. No respiratory distress.  Abdominal:     Palpations: Abdomen is soft.     Tenderness: There is abdominal tenderness. There is no guarding or rebound.  Musculoskeletal:     Cervical back: Neck supple.  Skin:    General: Skin is warm and dry.  Neurological:     Mental Status: She is alert and oriented to person, place, and time.  Psychiatric:        Mood and Affect: Mood normal.     ED Results / Procedures / Treatments   Labs (all labs ordered are listed, but only abnormal results are displayed) Labs Reviewed  URINALYSIS, ROUTINE W REFLEX MICROSCOPIC - Abnormal; Notable for the following components:      Result Value   Glucose, UA >=500 (*)    Leukocytes,Ua MODERATE (*)    Bacteria, UA RARE (*)    All other components within normal limits  BASIC METABOLIC PANEL - Abnormal; Notable for the following components:   Chloride 94 (*)    Glucose, Bld 274 (*)    BUN 42 (*)    Creatinine, Ser 1.52 (*)    GFR, Estimated 32 (*)    Anion gap 19 (*)    All other components within normal limits  CBC - Abnormal; Notable for the following components:   WBC 11.4 (*)    RBC 3.66 (*)    Hemoglobin 11.3 (*)    HCT 35.3 (*)    All other components within normal limits  CBG MONITORING, ED - Abnormal; Notable for the following components:   Glucose-Capillary 377 (*)    All other components within normal limits  I-STAT CG4 LACTIC ACID, ED - Abnormal; Notable for the following components:   Lactic Acid, Venous 2.0 (*)    All other components within normal limits  SARS CORONAVIRUS 2 BY RT PCR  URINE CULTURE  I-STAT CG4 LACTIC  ACID, ED    EKG None  Radiology CT ABDOMEN PELVIS WO CONTRAST  Result Date: 01/24/2023 CLINICAL DATA:  Recurrent UTI EXAM:  CT ABDOMEN AND PELVIS WITHOUT CONTRAST TECHNIQUE: Multidetector CT imaging of the abdomen and pelvis was performed following the standard protocol without IV contrast. RADIATION DOSE REDUCTION: This exam was performed according to the departmental dose-optimization program which includes automated exposure control, adjustment of the mA and/or kV according to patient size and/or use of iterative reconstruction technique. COMPARISON:  06/28/2017 FINDINGS: Lower chest:  No contributory findings. Hepatobiliary: No focal liver abnormality.No evidence of biliary obstruction or stone. Pancreas: Unremarkable. Spleen: Unremarkable. Adrenals/Urinary Tract: Negative adrenals. No hydronephrosis or ureteral stone. No specific signs of bladder infection. Punctate calculus at the interpolar left kidney. There is also atheromatous plaque at the left renal hilum. 18 mm left renal cystic density which is stable from prior, no follow-up imaging is recommended. Stomach/Bowel:  No obstruction. No visible bowel inflammation. Vascular/Lymphatic: No acute vascular abnormality. Extensive atheromatous plaque affecting the aorta and branch vessels. No mass or adenopathy. Reproductive:Hysterectomy. Other: No ascites or pneumoperitoneum. Musculoskeletal: No acute abnormalities. Generalized spinal degeneration with L4-5 and L5-S1 solid arthrodesis. Degenerative scoliosis and foraminal narrowings. Subjective osteopenia. IMPRESSION: No acute finding. No hydronephrosis, ureteral calculus, or specific signs of UTI. Electronically Signed   By: Tiburcio Pea M.D.   On: 01/24/2023 04:40    Procedures Procedures    Medications Ordered in ED Medications  sodium chloride 0.9 % bolus 500 mL (0 mLs Intravenous Stopped 01/24/23 0438)    ED Course/ Medical Decision Making/ A&P                                 Medical Decision Making Amount and/or Complexity of Data Reviewed Labs: ordered. Radiology: ordered.   This patient presents to the  ED for concern of dysuria, this involves an extensive number of treatment options, and is a complaint that carries with it a high risk of complications and morbidity.  I considered the following differential and admission for this acute, potentially life threatening condition.  The differential diagnosis includes recurrent UTI, urinary frequency related to hyperglycemia, interstitial cystitis  MDM:    This is a 89 year old female who presents with persistent dysuria.  She is nontoxic and vital signs are reassuring.  She is afebrile.  She has had some abdominal discomfort.  She is not ill-appearing.  I reviewed her previous culture from her office visit which was essentially negative with the 10,000 on speciated gram-negative rods.  She finished her Macrobid.  Suspect some of her frequency may be related to hyperglycemia reported at home.  However given ongoing dysuria, urinalysis was obtained.  Shows moderate leuk esterase, 11-20 white cells, rare bacteria.  Will culture.  Patient is at high risk for resistant UTI.  Given recent culture that was negative, would hold off on treatment.  CT scan was obtained given abdominal pain.  CT scan does not show any evidence of perinephric stranding or bladder stranding.  No obvious other intra-abdominal pathology.  Lab work notable for mild leukocytosis of 11.4.  Patient does have a slight anion gap.  Creatinine is at baseline.  She has a mild lactic acidosis.  This could be contributing.  She was given fluids.  She does not appear septic.  Discussed plan of holding off on antibiotics until urine culture results especially given how well-appearing the patient is.  I have also given  urology referral given concern for possible need for prophylactic antibiotics.  (Labs, imaging, consults)  Labs: I Ordered, and personally interpreted labs.  The pertinent results include: CBC, BMP, COVID, influenza, urinalysis  Imaging Studies ordered: I ordered imaging studies including  CT abdomen I independently visualized and interpreted imaging. I agree with the radiologist interpretation  Additional history obtained from family at bedside.  External records from outside source obtained and reviewed including prior evaluations  Cardiac Monitoring: The patient was not maintained on a cardiac monitor.  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) 11/18/2019   Hypercholesteremia    Hypertension    Liver lesion    Neuropathy    Type II or unspecified type diabetes mellitus without mention of complication, not stated as uncontrolled    Vitamin D deficiency    Wears glasses      Medicines Meds ordered this encounter  Medications   sodium chloride 0.9 % bolus 500 mL    I have reviewed the patients home medicines and have made adjustments as needed  Problem List / ED Course: Problem List Items Addressed This Visit   None Visit Diagnoses     Dysuria    -  Primary                   Final Clinical Impression(s) / ED Diagnoses Final diagnoses:  Dysuria    Rx / DC Orders ED Discharge Orders     None         Shon Baton, MD 01/24/23 [phone]

## 2023-01-25 LAB — URINE CULTURE: Culture: NO GROWTH

## 2023-02-21 ENCOUNTER — Ambulatory Visit: Payer: Medicare Other

## 2023-02-21 ENCOUNTER — Encounter: Payer: Self-pay | Admitting: Nurse Practitioner

## 2023-02-21 ENCOUNTER — Ambulatory Visit (INDEPENDENT_AMBULATORY_CARE_PROVIDER_SITE_OTHER): Payer: Medicare Other | Admitting: Nurse Practitioner

## 2023-02-21 VITALS — BP 148/72 | HR 59 | Temp 97.9°F | Ht 63.0 in | Wt 115.0 lb

## 2023-02-21 DIAGNOSIS — Z23 Encounter for immunization: Secondary | ICD-10-CM

## 2023-02-21 DIAGNOSIS — H353211 Exudative age-related macular degeneration, right eye, with active choroidal neovascularization: Secondary | ICD-10-CM | POA: Diagnosis not present

## 2023-02-21 DIAGNOSIS — E785 Hyperlipidemia, unspecified: Secondary | ICD-10-CM | POA: Diagnosis not present

## 2023-02-21 DIAGNOSIS — E1122 Type 2 diabetes mellitus with diabetic chronic kidney disease: Secondary | ICD-10-CM | POA: Diagnosis not present

## 2023-02-21 DIAGNOSIS — N183 Chronic kidney disease, stage 3 unspecified: Secondary | ICD-10-CM | POA: Diagnosis not present

## 2023-02-21 DIAGNOSIS — E118 Type 2 diabetes mellitus with unspecified complications: Secondary | ICD-10-CM

## 2023-02-21 DIAGNOSIS — H353124 Nonexudative age-related macular degeneration, left eye, advanced atrophic with subfoveal involvement: Secondary | ICD-10-CM | POA: Diagnosis not present

## 2023-02-21 DIAGNOSIS — E1169 Type 2 diabetes mellitus with other specified complication: Secondary | ICD-10-CM | POA: Diagnosis not present

## 2023-02-21 DIAGNOSIS — I10 Essential (primary) hypertension: Secondary | ICD-10-CM

## 2023-02-21 DIAGNOSIS — L84 Corns and callosities: Secondary | ICD-10-CM

## 2023-02-21 DIAGNOSIS — B351 Tinea unguium: Secondary | ICD-10-CM

## 2023-02-21 DIAGNOSIS — Z681 Body mass index (BMI) 19 or less, adult: Secondary | ICD-10-CM | POA: Diagnosis not present

## 2023-02-21 DIAGNOSIS — I700 Atherosclerosis of aorta: Secondary | ICD-10-CM | POA: Diagnosis not present

## 2023-02-21 DIAGNOSIS — E1140 Type 2 diabetes mellitus with diabetic neuropathy, unspecified: Secondary | ICD-10-CM

## 2023-02-21 DIAGNOSIS — Z794 Long term (current) use of insulin: Secondary | ICD-10-CM | POA: Diagnosis not present

## 2023-02-21 DIAGNOSIS — Z79899 Other long term (current) drug therapy: Secondary | ICD-10-CM

## 2023-02-21 NOTE — Progress Notes (Addendum)
Assessment and Plan:  [name] was seen today for acute visit.  Diagnoses and all orders for this visit:  Type 2 diabetes mellitus with complication, with long-term current use of insulin (HCC) Continue medications: Metformin 500 mg x 1 every day, Humalog 5-10 units TID on sliding scale Continue diet and exercise.  Perform daily foot/skin check, notify office of any concerning changes.  Check A1C  -     Hemoglobin A1C w/out eAG  Hyperlipidemia associated with type 2 diabetes mellitus (HCC) Continue diet, exercise and Rosuvastatin 20 mg every day  -     Lipid panel  CKD stage 3 due to type 2 diabetes mellitus (HCC) Increase fluids, avoid NSAIDS, monitor sugars, will monitor -     CBC with Differential/Platelet -     COMPLETE METABOLIC PANEL WITH GFR  Essential hypertension - continue medications- Ziac 2.5/6.25 mg every day- keep BP log daily and bring to follow up visit in 2 weeks - Continue DASH diet, exercise and monitor at home. Call if greater than 130/80.  Go to the ER if any chest pain, shortness of breath, nausea, dizziness, severe HA, changes vision/speech   Atherosclerosis of aorta (HCC) Control BP, weight, blood sugars and cholesterol  Neuropathy due to type 2 diabetes mellitus (HCC) Perform daily foot/skin check, notify office of any concerning changes.  Currently controlled without medication  Advanced nonexudative age-related macular degeneration of left eye with subfoveal involvement/Exudative age-related macular degeneration of right eye with active choroidal neovascularization (HCC) Continue to follow with ophthalmology- she is overdue for an appointment with Dr. Luciana Axe.  Strongly encouraged to schedule an appointment  Body mass index (BMI) 19.9 or less, adult  Medication management -     CBC with Differential/Platelet -     COMPLETE METABOLIC PANEL WITH GFR -     Lipid panel -     Hemoglobin A1C w/out eAG -     Flu vaccine HIGH DOSE PF(Fluzone  Trivalent)  Flu vaccine need -     Flu vaccine HIGH DOSE PF(Fluzone Trivalent)  Corn of toe -     Ambulatory referral to Podiatry  Onychomycosis        Notify office for further evaluation and treatment, questions or concerns if s/s fail to improve. The risks and benefits of my recommendations, as well as other treatment options were discussed with the patient today. Questions were answered.  Further disposition pending results of labs. Discussed med's effects and SE's.    Over 15 minutes of exam, counseling, chart review, and critical decision making was performed.      ------------------------------------------------------------------------------------------------------------------   HPI BP (!) 148/72   Pulse (!) 59   Temp 97.9 F (36.6 C)   Ht 5\' 3"  (1.6 m)   Wt 115 lb (52.2 kg)   SpO2 99%   BMI 20.37 kg/m    BP well controlled with Ziac 2.5/6.25 mg every day. Today BP is: BP Readings from Last 3 Encounters:  02/21/23 (!) 148/72  01/24/23 (!) 169/70  01/18/23 (!) 140/62  Denies headaches, chest pain, shortness of breath and dizziness    BMI is Body mass index is 20.37 kg/m., she has not been working on diet and exercise. She has been eating better. Her sons are taking shifts staying with her.  Wt Readings from Last 3 Encounters:  02/21/23 115 lb (52.2 kg)  01/23/23 111 lb 1.8 oz (50.4 kg)  01/18/23 111 lb 3.2 oz (50.4 kg)   Her last A1c was 12.6. She is using  Humalog 5-10 units TID per sliding scale. Metformin 500 mg every day. Not using Basaglar insulin. Blood sugars have been running in 100-200 Lab Results  Component Value Date   HGBA1C 12.6 (H) 11/03/2022   HGBA1C CANCELED 09/22/2022   HGBA1C 7.0 (H) 12/07/2021   Lab Results  Component Value Date   MICROALBUR 3.8 12/07/2021   LDLCALC 47 09/22/2022   CREATININE 1.52 (H) 01/23/2023    Lab Results  Component Value Date   EGFR 33 (L) 11/14/2022    Past Medical History:  Diagnosis Date    Arthritis    Chronic back pain    Cystoid macular edema of right eye 08/19/2019   Diabetic neuropathy (HCC)    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) 11/18/2019   Hypercholesteremia    Hypertension    Liver lesion    Neuropathy    Type II or unspecified type diabetes mellitus without mention of complication, not stated as uncontrolled    Vitamin D deficiency    Wears glasses      Allergies  Allergen Reactions   Ciprofloxacin     Dizziness    Morphine And Codeine Other (See Comments)    fuzzy feeling    Current Outpatient Medications on File Prior to Visit  Medication Sig   aspirin EC 81 MG tablet Take 81 mg by mouth daily. Swallow whole.   bisoprolol-hydrochlorothiazide (ZIAC) 2.5-6.25 MG tablet Take  1 tablet  Daily  for BP                                /                                                                   TAKE                                         BY                                                 MOUTH   Blood Glucose Calibration (ACCU-CHEK AVIVA) SOLN Check blood sugar 3 times daily-DX-E11.22   Blood Glucose Monitoring Suppl DEVI Use to test blood sugar three times a day   Cholecalciferol (VITAMIN D3) 2000 UNITS TABS Take 1 capsule by mouth 2 (two) times daily. PRN   COMFORT EZ PEN NEEDLES 32G X 4 MM MISC USE 2 TIMES DAILY AS DIRECTED , 1 PEN NEEDLE PER INJECTION.   Continuous Glucose Sensor (FREESTYLE LIBRE SENSOR SYSTEM) MISC Check blood sugar 3 times a day. Dx:E11.8, Z79.4   Cyanocobalamin (B-12) 2500 MCG TABS Take by mouth daily.   docusate sodium (COLACE) 100 MG capsule Take 1 capsule twice daily as needed for constipation with a bottle of water.   EASY COMFORT INSULIN SYRINGE 30G X 1/2" 0.5 ML MISC    glucose blood (ONETOUCH VERIO) test strip USE 1 STRIP TO CHECK GLUCOSE THREE TIMES DAILY AS DIRECTED  insulin lispro (HUMALOG) 100 UNIT/ML injection Inject 5-10 Units into the skin 3 (three)  times daily with meals. Per sliding scale   Lancet Devices (ADJUSTABLE LANCING DEVICE) MISC CHECK BLOOD SUGAR 3 TIMES A DAY   Lancets MISC Use to test blood sugar three times a day   linaclotide (LINZESS) 72 MCG capsule Start taking 1 cap daily for constipation.   meclizine (ANTIVERT) 25 MG tablet Take 0.5-1 tablets (12.5-25 mg total) by mouth 3 (three) times daily as needed for dizziness.   Melatonin 10 MG TABS Take by mouth at bedtime.   metFORMIN (GLUCOPHAGE-XR) 500 MG 24 hr tablet Take 1 tablet 1 x /day with Meals for Diabetes.   Multiple Vitamins-Minerals (CENTRUM SILVER 50+WOMEN) TABS Take 1 tablet by mouth daily.   Multiple Vitamins-Minerals (HAIR SKIN AND NAILS FORMULA PO) Take by mouth daily.   rosuvastatin (CRESTOR) 20 MG tablet Take   1 tablet  Daily for Cholesterol                                                                 /                                                          TAKE                                              BY                                        MOUTH                                      ONCE ? DAILY (Patient taking differently: Take 20 mg by mouth daily.)   traMADol (ULTRAM) 50 MG tablet Take 50 mg by mouth every 6 (six) hours as needed for moderate pain. Pt only takes 2 pills per night   Zinc 50 MG TABS Take by mouth as needed.   dorzolamide-timolol (COSOPT) 22.3-6.8 MG/ML ophthalmic solution Place 1 drop into both eyes 2 (two) times daily. (Patient not taking: Reported on 02/21/2023)   ipratropium (ATROVENT) 0.03 % nasal spray Place 2 sprays into the nose 3 (three) times daily.   latanoprost (XALATAN) 0.005 % ophthalmic solution Place 1 drop into both eyes at bedtime. (Patient not taking: Reported on 02/21/2023)   triamcinolone (KENALOG) 0.025 % ointment Apply 1 Application topically 2 (two) times daily.   No current facility-administered medications on file prior to visit.   Health Maintenance  Topic Date Due   Zoster Vaccines- Shingrix (1 of 2)  Never done   COVID-19 Vaccine (3 - Pfizer risk series) 09/10/2019   DTaP/Tdap/Td (2 - Tdap) 01/03/2021   OPHTHALMOLOGY EXAM  08/25/2022   HEMOGLOBIN A1C  05/06/2023   Medicare Annual  Wellness (AWV)  09/22/2023   FOOT EXAM  02/21/2024   Pneumonia Vaccine 58+ Years old  Completed   INFLUENZA VACCINE  Completed   DEXA SCAN  Completed   HPV VACCINES  Aged Out     ROS: all negative except what is noted in the HPI.   Physical Exam:  BP (!) 148/72   Pulse (!) 59   Temp 97.9 F (36.6 C)   Ht 5\' 3"  (1.6 m)   Wt 115 lb (52.2 kg)   SpO2 99%   BMI 20.37 kg/m   General Appearance: NAD.  Awake, conversant and cooperative. Eyes: PERRLA, EOMs intact.  Sclera white.  Conjunctiva without erythema. Sinuses: No frontal/maxillary tenderness.  No nasal discharge. Nares patent.  ENT/Mouth: Ext aud canals clear.  Bilateral TMs without erythema or bulging. Hearing intact.  Posterior pharynx without swelling or exudate.   Neck: Supple.  No masses, nodules or thyromegaly. Respiratory: Effort is regular with non-labored breathing. Breath sounds are equal bilaterally without rales, rhonchi, wheezing or stridor.  Cardio: RRR with no MRGs. Brisk peripheral pulses without edema.  Abdomen: Active BS in all four quadrants.  Soft and non-tender without guarding, rebound tenderness, hernias or masses. Lymphatics: Non tender without lymphadenopathy.  Musculoskeletal: Full ROM, 5/5 strength, normal ambulation.  No clubbing or cyanosis. Skin: Appropriate color for ethnicity. Warm and dry. Right little toe has corn with top layer loose. Onychomycosis of great toenails bilaterally Neuro: CN II-XII grossly normal. Normal muscle tone without cerebellar symptoms and decreased sensation mid shin and feet/toes bilaterally to monofilament   Psych: AO X 3,  appropriate mood and affect, insight and judgment.     Raynelle Dick, NP 12:28 PM [facility]

## 2023-02-21 NOTE — Patient Instructions (Addendum)
Continue taking bisoprolol/hydrochlorothiazide 5/6.25 mg every day Check BP daily and keep log Bring with you to visit in 2 weeks  Get wider toe box shoes Dr. Charlsie Merles podiatrist office should be calling with appointment  Hypertension, Adult Hypertension is another name for high blood pressure. High blood pressure forces your heart to work harder to pump blood. This can cause problems over time. There are two numbers in a blood pressure reading. There is a top number (systolic) over a bottom number (diastolic). It is best to have a blood pressure that is below 120/80. What are the causes? The cause of this condition is not known. Some other conditions can lead to high blood pressure. What increases the risk? Some lifestyle factors can make you more likely to develop high blood pressure: Smoking. Not getting enough exercise or physical activity. Being overweight. Having too much fat, sugar, calories, or salt (sodium) in your diet. Drinking too much alcohol. Other risk factors include: Having any of these conditions: Heart disease. Diabetes. High cholesterol. Kidney disease. Obstructive sleep apnea. Having a family history of high blood pressure and high cholesterol. Age. The risk increases with age. Stress. What are the signs or symptoms? High blood pressure may not cause symptoms. Very high blood pressure (hypertensive crisis) may cause: Headache. Fast or uneven heartbeats (palpitations). Shortness of breath. Nosebleed. Vomiting or feeling like you may vomit (nauseous). Changes in how you see. Very bad chest pain. Feeling dizzy. Seizures. How is this treated? This condition is treated by making healthy lifestyle changes, such as: Eating healthy foods. Exercising more. Drinking less alcohol. Your doctor may prescribe medicine if lifestyle changes do not help enough and if: Your top number is above 130. Your bottom number is above 80. Your personal target blood pressure may  vary. Follow these instructions at home: Eating and drinking  If told, follow the DASH eating plan. To follow this plan: Fill one half of your plate at each meal with fruits and vegetables. Fill one fourth of your plate at each meal with whole grains. Whole grains include whole-wheat pasta, brown rice, and whole-grain bread. Eat or drink low-fat dairy products, such as skim milk or low-fat yogurt. Fill one fourth of your plate at each meal with low-fat (lean) proteins. Low-fat proteins include fish, chicken without skin, eggs, beans, and tofu. Avoid fatty meat, cured and processed meat, or chicken with skin. Avoid pre-made or processed food. Limit the amount of salt in your diet to less than 1,500 mg each day. Do not drink alcohol if: Your doctor tells you not to drink. You are pregnant, may be pregnant, or are planning to become pregnant. If you drink alcohol: Limit how much you have to: 0-1 drink a day for women. 0-2 drinks a day for men. Know how much alcohol is in your drink. In the [place] one drink equals one 12 oz bottle of beer (355 mL), one 5 oz glass of wine (148 mL), or one 1 oz glass of hard liquor (44 mL). Lifestyle  Work with your doctor to stay at a healthy weight or to lose weight. Ask your doctor what the best weight is for you. Get at least 30 minutes of exercise that causes your heart to beat faster (aerobic exercise) most days of the week. This may include walking, swimming, or biking. Get at least 30 minutes of exercise that strengthens your muscles (resistance exercise) at least 3 days a week. This may include lifting weights or doing Pilates. Do not smoke or  use any products that contain nicotine or tobacco. If you need help quitting, ask your doctor. Check your blood pressure at home as told by your doctor. Keep all follow-up visits. Medicines Take over-the-counter and prescription medicines only as told by your doctor. Follow directions carefully. Do not skip  doses of blood pressure medicine. The medicine does not work as well if you skip doses. Skipping doses also puts you at risk for problems. Ask your doctor about side effects or reactions to medicines that you should watch for. Contact a doctor if: You think you are having a reaction to the medicine you are taking. You have headaches that keep coming back. You feel dizzy. You have swelling in your ankles. You have trouble with your vision. Get help right away if: You get a very bad headache. You start to feel mixed up (confused). You feel weak or numb. You feel faint. You have very bad pain in your: Chest. Belly (abdomen). You vomit more than once. You have trouble breathing. These symptoms may be an emergency. Get help right away. Call 911. Do not wait to see if the symptoms will go away. Do not drive yourself to the hospital. Summary Hypertension is another name for high blood pressure. High blood pressure forces your heart to work harder to pump blood. For most people, a normal blood pressure is less than 120/80. Making healthy choices can help lower blood pressure. If your blood pressure does not get lower with healthy choices, you may need to take medicine. This information is not intended to replace advice given to you by your health care provider. Make sure you discuss any questions you have with your health care provider. Document Revised: 01/28/2021 Document Reviewed: 01/28/2021 Elsevier Patient Education  2024 [employer].

## 2023-02-22 LAB — LIPID PANEL
Cholesterol: 122 mg/dL (ref <200)
HDL: 69 mg/dL (ref >=50)
LDL Cholesterol (Calc): 34 mg/dL
Non-HDL Cholesterol (Calc): 53 mg/dL (ref <130)
Total CHOL/HDL Ratio: 1.8 (calc) (ref <5.0)
Triglycerides: 100 mg/dL (ref <150)

## 2023-02-22 LAB — COMPLETE METABOLIC PANEL WITH GFR
AG Ratio: 1.2 (calc) (ref 1.0–2.5)
ALT: 25 U/L (ref 6–29)
AST: 29 U/L (ref 10–35)
Albumin: 4.1 g/dL (ref 3.6–5.1)
Alkaline phosphatase (APISO): 94 U/L (ref 37–153)
BUN/Creatinine Ratio: 20 (calc) (ref 6–22)
BUN: 34 mg/dL — ABNORMAL HIGH (ref 7–25)
CO2: 32 mmol/L (ref 20–32)
Calcium: 10 mg/dL (ref 8.6–10.4)
Chloride: 96 mmol/L — ABNORMAL LOW (ref 98–110)
Creat: 1.72 mg/dL — ABNORMAL HIGH (ref 0.60–0.95)
Globulin: 3.3 g/dL (ref 1.9–3.7)
Glucose, Bld: 483 mg/dL — ABNORMAL HIGH (ref 65–99)
Potassium: 5.3 mmol/L (ref 3.5–5.3)
Sodium: 138 mmol/L (ref 135–146)
Total Bilirubin: 0.4 mg/dL (ref 0.2–1.2)
Total Protein: 7.4 g/dL (ref 6.1–8.1)
eGFR: 28 mL/min/{1.73_m2} — ABNORMAL LOW (ref >=60)

## 2023-02-22 LAB — CBC WITH DIFFERENTIAL/PLATELET
Absolute Lymphocytes: 2146 {cells}/uL (ref 850–3900)
Absolute Monocytes: 666 {cells}/uL (ref 200–950)
Basophils Absolute: 52 {cells}/uL (ref 0–200)
Basophils Relative: 0.7 %
Eosinophils Absolute: 178 {cells}/uL (ref 15–500)
Eosinophils Relative: 2.4 %
HCT: 39.6 % (ref 35.0–45.0)
Hemoglobin: 12.3 g/dL (ref 11.7–15.5)
MCH: 31 pg (ref 27.0–33.0)
MCHC: 31.1 g/dL — ABNORMAL LOW (ref 32.0–36.0)
MCV: 99.7 fL (ref 80.0–100.0)
MPV: 12.5 fL (ref 7.5–12.5)
Monocytes Relative: 9 %
Neutro Abs: 4359 {cells}/uL (ref 1500–7800)
Neutrophils Relative %: 58.9 %
Platelets: 215 10*3/uL (ref 140–400)
RBC: 3.97 10*6/uL (ref 3.80–5.10)
RDW: 11.3 % (ref 11.0–15.0)
Total Lymphocyte: 29 %
WBC: 7.4 10*3/uL (ref 3.8–10.8)

## 2023-02-22 LAB — HEMOGLOBIN A1C W/OUT EAG: Hgb A1c MFr Bld: 11.2 %{Hb} — ABNORMAL HIGH (ref <5.7)

## 2023-03-07 ENCOUNTER — Ambulatory Visit: Payer: Medicare Other | Admitting: Nurse Practitioner

## 2023-03-08 ENCOUNTER — Ambulatory Visit: Payer: Medicare Other | Admitting: Podiatry

## 2023-03-08 ENCOUNTER — Encounter: Payer: Self-pay | Admitting: Podiatry

## 2023-03-08 DIAGNOSIS — M79675 Pain in left toe(s): Secondary | ICD-10-CM | POA: Diagnosis not present

## 2023-03-08 DIAGNOSIS — E1149 Type 2 diabetes mellitus with other diabetic neurological complication: Secondary | ICD-10-CM

## 2023-03-08 DIAGNOSIS — E1140 Type 2 diabetes mellitus with diabetic neuropathy, unspecified: Secondary | ICD-10-CM

## 2023-03-08 DIAGNOSIS — B351 Tinea unguium: Secondary | ICD-10-CM | POA: Diagnosis not present

## 2023-03-08 DIAGNOSIS — M2041 Other hammer toe(s) (acquired), right foot: Secondary | ICD-10-CM | POA: Diagnosis not present

## 2023-03-08 DIAGNOSIS — M79674 Pain in right toe(s): Secondary | ICD-10-CM | POA: Diagnosis not present

## 2023-03-08 DIAGNOSIS — L84 Corns and callosities: Secondary | ICD-10-CM

## 2023-03-08 NOTE — Progress Notes (Signed)
Subjective:   Patient ID: [name], female   DOB: 89 y.o.   MRN: [id number]   HPI Patient presents with caregiver with painful lesion fifth metatarsal right third digit right and thick yellow nail bed that she cannot cut.  Patient does have diabetes and does have digital deformities also noted right over left foot.  Patient does not smoke and tries to be active   Review of Systems  All other systems reviewed and are negative.       Objective:  Physical Exam Vitals and nursing note reviewed.  Constitutional:      Appearance: She is well-developed.  Pulmonary:     Effort: Pulmonary effort is normal.  Musculoskeletal:        General: Normal range of motion.  Skin:    General: Skin is warm.  Neurological:     Mental Status: She is alert.     Vascular status moderately diminished both DP PT pulses bilateral and patient is noted have diminishment of sharp dull vibratory bilateral.  Patient does have structural deformity of lesser digits right over left and has keratotic lesion fifth metatarsal head right and third digit right with them both being painful and high risk for her to take care of.  Thick yellow brittle nailbeds 1-5 both feet thickened painful     Assessment:  Chronic mycotic nail infection with pain lesions right with high risk with long-term diabetes with neuropathic changes and preulcerative like lesions     Plan:  H&P reviewed do not recommend any form of digital correction currently and trying to be as conservative as possible and sterile sharp debridement of the 2 lesions on the right foot was done and then debrided nailbeds 1-5 both feet no angiogenic bleeding patient to be seen back as needed

## 2023-03-14 NOTE — Progress Notes (Deleted)
Assessment and Plan:  Kova was seen today for acute visit.  Diagnoses and all orders for this visit:  Type 2 diabetes mellitus with complication, with long-term current use of insulin (HCC) Continue medications: Metformin 500 mg x 1 every day, Humalog 5-10 units TID on sliding scale Continue diet and exercise.  Perform daily foot/skin check, notify office of any concerning changes.  Check A1C  -     Hemoglobin A1C w/out eAG  Hyperlipidemia associated with type 2 diabetes mellitus (HCC) Continue diet, exercise and Rosuvastatin 20 mg every day  -     Lipid panel  CKD stage 3 due to type 2 diabetes mellitus (HCC) Increase fluids, avoid NSAIDS, monitor sugars, will monitor -     CBC with Differential/Platelet -     COMPLETE METABOLIC PANEL WITH GFR  Essential hypertension - continue medications- Ziac 2.5/6.25 mg every day- keep BP log daily and bring to follow up visit in 2 weeks - Continue DASH diet, exercise and monitor at home. Call if greater than 130/80.  Go to the ER if any chest pain, shortness of breath, nausea, dizziness, severe HA, changes vision/speech   Atherosclerosis of aorta (HCC) Control BP, weight, blood sugars and cholesterol  Neuropathy due to type 2 diabetes mellitus (HCC) Perform daily foot/skin check, notify office of any concerning changes.  Currently controlled without medication   Body mass index (BMI) 19.9 or less, adult  Medication management Medications reviewed and Questions answered          Notify office for further evaluation and treatment, questions or concerns if s/s fail to improve. The risks and benefits of my recommendations, as well as other treatment options were discussed with the patient today. Questions were answered.  Further disposition pending results of labs. Discussed med's effects and SE's.    Over 15 minutes of exam, counseling, chart review, and critical decision making was performed.       ------------------------------------------------------------------------------------------------------------------   HPI There were no vitals taken for this visit.   BP well controlled with Ziac 2.5/6.25 mg every day. Today BP is: BP Readings from Last 3 Encounters:  02/21/23 (!) 148/72  01/24/23 (!) 169/70  01/18/23 (!) 140/62  Denies headaches, chest pain, shortness of breath and dizziness    BMI is There is no height or weight on file to calculate BMI., she has not been working on diet and exercise. She has been eating better. Her sons are taking shifts staying with her.  Wt Readings from Last 3 Encounters:  02/21/23 115 lb (52.2 kg)  01/23/23 111 lb 1.8 oz (50.4 kg)  01/18/23 111 lb 3.2 oz (50.4 kg)   Her last A1c was 11.2. She is using Humalog 5-10 units TID per sliding scale. Metformin 500 mg every day. Not using Basaglar insulin. Blood sugars have been running in 100-200 Lab Results  Component Value Date   HGBA1C 11.2 (H) 02/21/2023   HGBA1C 12.6 (H) 11/03/2022   HGBA1C CANCELED 09/22/2022   Lab Results  Component Value Date   MICROALBUR 3.8 12/07/2021   LDLCALC 34 02/21/2023   CREATININE 1.72 (H) 02/21/2023    Lab Results  Component Value Date   EGFR 28 (L) 02/21/2023    Past Medical History:  Diagnosis Date   Arthritis    Chronic back pain    Cystoid macular edema of right eye 08/19/2019   Diabetic neuropathy (HCC)    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) 11/18/2019   Hypercholesteremia  Hypertension    Liver lesion    Neuropathy    Type II or unspecified type diabetes mellitus without mention of complication, not stated as uncontrolled    Vitamin D deficiency    Wears glasses      Allergies  Allergen Reactions   Ciprofloxacin     Dizziness    Morphine And Codeine Other (See Comments)    fuzzy feeling    Current Outpatient Medications on File Prior to Visit  Medication  Sig   aspirin EC 81 MG tablet Take 81 mg by mouth daily. Swallow whole.   bisoprolol-hydrochlorothiazide (ZIAC) 2.5-6.25 MG tablet Take  1 tablet  Daily  for BP                                /                                                                   TAKE                                         BY                                                 MOUTH   Blood Glucose Calibration (ACCU-CHEK AVIVA) SOLN Check blood sugar 3 times daily-DX-E11.22   Blood Glucose Monitoring Suppl DEVI Use to test blood sugar three times a day   Cholecalciferol (VITAMIN D3) 2000 UNITS TABS Take 1 capsule by mouth 2 (two) times daily. PRN   COMFORT EZ PEN NEEDLES 32G X 4 MM MISC USE 2 TIMES DAILY AS DIRECTED , 1 PEN NEEDLE PER INJECTION.   Continuous Glucose Sensor (FREESTYLE LIBRE SENSOR SYSTEM) MISC Check blood sugar 3 times a day. Dx:E11.8, Z79.4   Cyanocobalamin (B-12) 2500 MCG TABS Take by mouth daily.   docusate sodium (COLACE) 100 MG capsule Take 1 capsule twice daily as needed for constipation with a bottle of water.   dorzolamide-timolol (COSOPT) 22.3-6.8 MG/ML ophthalmic solution Place 1 drop into both eyes 2 (two) times daily.   EASY COMFORT INSULIN SYRINGE 30G X 1/2" 0.5 ML MISC    glucose blood (ONETOUCH VERIO) test strip USE 1 STRIP TO CHECK GLUCOSE THREE TIMES DAILY AS DIRECTED   insulin lispro (HUMALOG) 100 UNIT/ML injection Inject 5-10 Units into the skin 3 (three) times daily with meals. Per sliding scale   ipratropium (ATROVENT) 0.03 % nasal spray Place 2 sprays into the nose 3 (three) times daily.   Lancet Devices (ADJUSTABLE LANCING DEVICE) MISC CHECK BLOOD SUGAR 3 TIMES A DAY   Lancets MISC Use to test blood sugar three times a day   latanoprost (XALATAN) 0.005 % ophthalmic solution Place 1 drop into both eyes at bedtime.   linaclotide (LINZESS) 72 MCG capsule Start taking 1 cap daily for constipation.   meclizine (ANTIVERT) 25 MG tablet Take 0.5-1 tablets (12.5-25 mg total) by mouth 3  (three) times daily as needed for  dizziness.   Melatonin 10 MG TABS Take by mouth at bedtime.   metFORMIN (GLUCOPHAGE-XR) 500 MG 24 hr tablet Take 1 tablet 1 x /day with Meals for Diabetes.   Multiple Vitamins-Minerals (CENTRUM SILVER 50+WOMEN) TABS Take 1 tablet by mouth daily.   Multiple Vitamins-Minerals (HAIR SKIN AND NAILS FORMULA PO) Take by mouth daily.   rosuvastatin (CRESTOR) 20 MG tablet Take   1 tablet  Daily for Cholesterol                                                                 /                                                          TAKE                                              BY                                        MOUTH                                      ONCE ? DAILY (Patient taking differently: Take 20 mg by mouth daily.)   traMADol (ULTRAM) 50 MG tablet Take 50 mg by mouth every 6 (six) hours as needed for moderate pain. Pt only takes 2 pills per night   triamcinolone (KENALOG) 0.025 % ointment Apply 1 Application topically 2 (two) times daily.   Zinc 50 MG TABS Take by mouth as needed.   No current facility-administered medications on file prior to visit.   Health Maintenance  Topic Date Due   Zoster Vaccines- Shingrix (1 of 2) Never done   COVID-19 Vaccine (3 - Pfizer risk series) 09/10/2019   DTaP/Tdap/Td (2 - Tdap) 01/03/2021   OPHTHALMOLOGY EXAM  08/25/2022   HEMOGLOBIN A1C  08/22/2023   Medicare Annual Wellness (AWV)  09/22/2023   FOOT EXAM  02/21/2024   Pneumonia Vaccine 50+ Years old  Completed   INFLUENZA VACCINE  Completed   DEXA SCAN  Completed   HPV VACCINES  Aged Out     ROS: all negative except what is noted in the HPI.   Physical Exam:  There were no vitals taken for this visit.  General Appearance: NAD.  Awake, conversant and cooperative. Eyes: PERRLA, EOMs intact.  Sclera white.  Conjunctiva without erythema. Sinuses: No frontal/maxillary tenderness.  No nasal discharge. Nares patent.  ENT/Mouth: Ext aud canals clear.   Bilateral TMs without erythema or bulging. Hearing intact.  Posterior pharynx without swelling or exudate.   Neck: Supple.  No masses, nodules or thyromegaly. Respiratory: Effort is regular with non-labored breathing. Breath sounds are equal bilaterally without rales, rhonchi, wheezing or stridor.  Cardio: RRR with no  MRGs. Brisk peripheral pulses without edema.  Abdomen: Active BS in all four quadrants.  Soft and non-tender without guarding, rebound tenderness, hernias or masses. Lymphatics: Non tender without lymphadenopathy.  Musculoskeletal: Full ROM, 5/5 strength, normal ambulation.  No clubbing or cyanosis. Skin: Appropriate color for ethnicity. Warm and dry. Right little toe has corn with top layer loose. Onychomycosis of great toenails bilaterally Neuro: CN II-XII grossly normal. Normal muscle tone without cerebellar symptoms and decreased sensation mid shin and feet/toes bilaterally to monofilament   Psych: AO X 3,  appropriate mood and affect, insight and judgment.     Raynelle Dick, NP 11:45 AM [facility]

## 2023-03-15 ENCOUNTER — Ambulatory Visit: Payer: Medicare Other | Admitting: Nurse Practitioner

## 2023-03-15 DIAGNOSIS — Z79899 Other long term (current) drug therapy: Secondary | ICD-10-CM

## 2023-03-15 DIAGNOSIS — N183 Chronic kidney disease, stage 3 unspecified: Secondary | ICD-10-CM

## 2023-03-15 DIAGNOSIS — I10 Essential (primary) hypertension: Secondary | ICD-10-CM

## 2023-03-15 DIAGNOSIS — Z794 Long term (current) use of insulin: Secondary | ICD-10-CM

## 2023-03-15 DIAGNOSIS — Z681 Body mass index (BMI) 19 or less, adult: Secondary | ICD-10-CM

## 2023-03-15 DIAGNOSIS — E1140 Type 2 diabetes mellitus with diabetic neuropathy, unspecified: Secondary | ICD-10-CM

## 2023-03-15 DIAGNOSIS — I700 Atherosclerosis of aorta: Secondary | ICD-10-CM

## 2023-03-15 DIAGNOSIS — E1169 Type 2 diabetes mellitus with other specified complication: Secondary | ICD-10-CM

## 2023-03-31 ENCOUNTER — Ambulatory Visit: Payer: Medicare Other | Admitting: Nurse Practitioner

## 2023-04-04 ENCOUNTER — Ambulatory Visit: Payer: Medicare Other | Admitting: Nurse Practitioner

## 2023-04-10 DIAGNOSIS — H353124 Nonexudative age-related macular degeneration, left eye, advanced atrophic with subfoveal involvement: Secondary | ICD-10-CM | POA: Diagnosis not present

## 2023-04-10 DIAGNOSIS — H353231 Exudative age-related macular degeneration, bilateral, with active choroidal neovascularization: Secondary | ICD-10-CM | POA: Diagnosis not present

## 2023-04-10 DIAGNOSIS — H353222 Exudative age-related macular degeneration, left eye, with inactive choroidal neovascularization: Secondary | ICD-10-CM | POA: Diagnosis not present

## 2023-04-11 NOTE — Progress Notes (Deleted)
Assessment and Plan:  [name] was seen today for acute visit.  Diagnoses and all orders for this visit:  Type 2 diabetes mellitus with complication, with long-term current use of insulin (HCC) Continue medications: Metformin 500 mg x 1 every day, Humalog 5-10 units TID on sliding scale Continue diet and exercise.  Perform daily foot/skin check, notify office of any concerning changes.  Check A1C  -     Hemoglobin A1C w/out eAG  Hyperlipidemia associated with type 2 diabetes mellitus (HCC) Continue diet, exercise and Rosuvastatin 20 mg every day  -     Lipid panel  CKD stage 4 due to type 2 diabetes mellitus (HCC) Increase fluids, avoid NSAIDS, monitor sugars, will monitor -     CBC with Differential/Platelet -     COMPLETE METABOLIC PANEL WITH GFR  Essential hypertension - continue medications- Ziac 2.5/6.25 mg every day- keep BP log daily and bring to follow up visit in 2 weeks - Continue DASH diet, exercise and monitor at home. Call if greater than 130/80.  Go to the ER if any chest pain, shortness of breath, nausea, dizziness, severe HA, changes vision/speech   Atherosclerosis of aorta (HCC) Control BP, weight, blood sugars and cholesterol  Neuropathy due to type 2 diabetes mellitus (HCC) Perform daily foot/skin check, notify office of any concerning changes.  Currently controlled without medication   Body mass index (BMI) 19.9 or less, adult  Medication management Medications reviewed and Questions answered          Notify office for further evaluation and treatment, questions or concerns if s/s fail to improve. The risks and benefits of my recommendations, as well as other treatment options were discussed with the patient today. Questions were answered.  Further disposition pending results of labs. Discussed med's effects and SE's.    Over 15 minutes of exam, counseling, chart review, and critical decision making was performed.       ------------------------------------------------------------------------------------------------------------------   HPI There were no vitals taken for this visit.   BP well controlled with Ziac 2.5/6.25 mg every day. Today BP is: BP Readings from Last 3 Encounters:  02/21/23 (!) 148/72  01/24/23 (!) 169/70  01/18/23 (!) 140/62  Denies headaches, chest pain, shortness of breath and dizziness    BMI is There is no height or weight on file to calculate BMI., she has not been working on diet and exercise. She has been eating better. Her sons are taking shifts staying with her.  Wt Readings from Last 3 Encounters:  02/21/23 115 lb (52.2 kg)  01/23/23 111 lb 1.8 oz (50.4 kg)  01/18/23 111 lb 3.2 oz (50.4 kg)   Her last A1c was 11.2. She is using Humalog 5-10 units TID per sliding scale. Metformin 500 mg every day. Not using Basaglar insulin. Blood sugars have been running in 100-200 Lab Results  Component Value Date   HGBA1C 11.2 (H) 02/21/2023   HGBA1C 12.6 (H) 11/03/2022   HGBA1C CANCELED 09/22/2022   Lab Results  Component Value Date   MICROALBUR 3.8 12/07/2021   LDLCALC 34 02/21/2023   CREATININE 1.72 (H) 02/21/2023    Lab Results  Component Value Date   EGFR 28 (L) 02/21/2023    Past Medical History:  Diagnosis Date   Arthritis    Chronic back pain    Cystoid macular edema of right eye 08/19/2019   Diabetic neuropathy (HCC)    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) 11/18/2019   Hypercholesteremia  Hypertension    Liver lesion    Neuropathy    Type II or unspecified type diabetes mellitus without mention of complication, not stated as uncontrolled    Vitamin D deficiency    Wears glasses      Allergies  Allergen Reactions   Ciprofloxacin     Dizziness    Morphine And Codeine Other (See Comments)    fuzzy feeling    Current Outpatient Medications on File Prior to Visit  Medication  Sig   aspirin EC 81 MG tablet Take 81 mg by mouth daily. Swallow whole.   bisoprolol-hydrochlorothiazide (ZIAC) 2.5-6.25 MG tablet Take  1 tablet  Daily  for BP                                /                                                                   TAKE                                         BY                                                 MOUTH   Blood Glucose Calibration (ACCU-CHEK AVIVA) SOLN Check blood sugar 3 times daily-DX-E11.22   Blood Glucose Monitoring Suppl DEVI Use to test blood sugar three times a day   Cholecalciferol (VITAMIN D3) 2000 UNITS TABS Take 1 capsule by mouth 2 (two) times daily. PRN   COMFORT EZ PEN NEEDLES 32G X 4 MM MISC USE 2 TIMES DAILY AS DIRECTED , 1 PEN NEEDLE PER INJECTION.   Continuous Glucose Sensor (FREESTYLE LIBRE SENSOR SYSTEM) MISC Check blood sugar 3 times a day. Dx:E11.8, Z79.4   Cyanocobalamin (B-12) 2500 MCG TABS Take by mouth daily.   docusate sodium (COLACE) 100 MG capsule Take 1 capsule twice daily as needed for constipation with a bottle of water.   dorzolamide-timolol (COSOPT) 22.3-6.8 MG/ML ophthalmic solution Place 1 drop into both eyes 2 (two) times daily.   EASY COMFORT INSULIN SYRINGE 30G X 1/2" 0.5 ML MISC    glucose blood (ONETOUCH VERIO) test strip USE 1 STRIP TO CHECK GLUCOSE THREE TIMES DAILY AS DIRECTED   insulin lispro (HUMALOG) 100 UNIT/ML injection Inject 5-10 Units into the skin 3 (three) times daily with meals. Per sliding scale   ipratropium (ATROVENT) 0.03 % nasal spray Place 2 sprays into the nose 3 (three) times daily.   Lancet Devices (ADJUSTABLE LANCING DEVICE) MISC CHECK BLOOD SUGAR 3 TIMES A DAY   Lancets MISC Use to test blood sugar three times a day   latanoprost (XALATAN) 0.005 % ophthalmic solution Place 1 drop into both eyes at bedtime.   linaclotide (LINZESS) 72 MCG capsule Start taking 1 cap daily for constipation.   meclizine (ANTIVERT) 25 MG tablet Take 0.5-1 tablets (12.5-25 mg total) by mouth 3  (three) times daily as needed for  dizziness.   Melatonin 10 MG TABS Take by mouth at bedtime.   metFORMIN (GLUCOPHAGE-XR) 500 MG 24 hr tablet Take 1 tablet 1 x /day with Meals for Diabetes.   Multiple Vitamins-Minerals (CENTRUM SILVER 50+WOMEN) TABS Take 1 tablet by mouth daily.   Multiple Vitamins-Minerals (HAIR SKIN AND NAILS FORMULA PO) Take by mouth daily.   rosuvastatin (CRESTOR) 20 MG tablet Take   1 tablet  Daily for Cholesterol                                                                 /                                                          TAKE                                              BY                                        MOUTH                                      ONCE ? DAILY (Patient taking differently: Take 20 mg by mouth daily.)   traMADol (ULTRAM) 50 MG tablet Take 50 mg by mouth every 6 (six) hours as needed for moderate pain. Pt only takes 2 pills per night   triamcinolone (KENALOG) 0.025 % ointment Apply 1 Application topically 2 (two) times daily.   Zinc 50 MG TABS Take by mouth as needed.   No current facility-administered medications on file prior to visit.   Health Maintenance  Topic Date Due   Zoster Vaccines- Shingrix (1 of 2) Never done   COVID-19 Vaccine (3 - Pfizer risk series) 09/10/2019   DTaP/Tdap/Td (2 - Tdap) 01/03/2021   OPHTHALMOLOGY EXAM  08/25/2022   HEMOGLOBIN A1C  08/22/2023   Medicare Annual Wellness (AWV)  09/22/2023   FOOT EXAM  02/21/2024   Pneumonia Vaccine 71+ Years old  Completed   INFLUENZA VACCINE  Completed   DEXA SCAN  Completed   HPV VACCINES  Aged Out     ROS: all negative except what is noted in the HPI.   Physical Exam:  There were no vitals taken for this visit.  General Appearance: NAD.  Awake, conversant and cooperative. Eyes: PERRLA, EOMs intact.  Sclera white.  Conjunctiva without erythema. Sinuses: No frontal/maxillary tenderness.  No nasal discharge. Nares patent.  ENT/Mouth: Ext aud canals clear.   Bilateral TMs without erythema or bulging. Hearing intact.  Posterior pharynx without swelling or exudate.   Neck: Supple.  No masses, nodules or thyromegaly. Respiratory: Effort is regular with non-labored breathing. Breath sounds are equal bilaterally without rales, rhonchi, wheezing or stridor.  Cardio: RRR with no  MRGs. Brisk peripheral pulses without edema.  Abdomen: Active BS in all four quadrants.  Soft and non-tender without guarding, rebound tenderness, hernias or masses. Lymphatics: Non tender without lymphadenopathy.  Musculoskeletal: Full ROM, 5/5 strength, normal ambulation.  No clubbing or cyanosis. Skin: Appropriate color for ethnicity. Warm and dry. Right little toe has corn with top layer loose. Onychomycosis of great toenails bilaterally Neuro: CN II-XII grossly normal. Normal muscle tone without cerebellar symptoms and decreased sensation mid shin and feet/toes bilaterally to monofilament   Psych: AO X 3,  appropriate mood and affect, insight and judgment.     Raynelle Dick, NP 12:31 PM [facility]

## 2023-04-12 ENCOUNTER — Ambulatory Visit: Payer: Medicare Other | Admitting: Nurse Practitioner

## 2023-04-12 DIAGNOSIS — E118 Type 2 diabetes mellitus with unspecified complications: Secondary | ICD-10-CM

## 2023-04-12 DIAGNOSIS — Z681 Body mass index (BMI) 19 or less, adult: Secondary | ICD-10-CM

## 2023-04-12 DIAGNOSIS — Z79899 Other long term (current) drug therapy: Secondary | ICD-10-CM

## 2023-04-12 DIAGNOSIS — E1122 Type 2 diabetes mellitus with diabetic chronic kidney disease: Secondary | ICD-10-CM

## 2023-04-12 DIAGNOSIS — E785 Hyperlipidemia, unspecified: Secondary | ICD-10-CM

## 2023-04-12 DIAGNOSIS — E1140 Type 2 diabetes mellitus with diabetic neuropathy, unspecified: Secondary | ICD-10-CM

## 2023-04-12 DIAGNOSIS — I10 Essential (primary) hypertension: Secondary | ICD-10-CM

## 2023-04-12 DIAGNOSIS — I700 Atherosclerosis of aorta: Secondary | ICD-10-CM

## 2023-04-17 NOTE — Progress Notes (Unsigned)
Assessment and Plan:  [name] was seen today for acute visit.  Diagnoses and all orders for this visit:  Type 2 diabetes mellitus with complication, with long-term current use of insulin (HCC) Start levemir 10 units every morning Continue Humalog 5-15 units twice a day sliding scale depending on blood sugar results. No humalog if blood sugar is less than 100 If blood sugars are consistently less than 100 notify the office and will adjust levemir Continue diet and exercise.  Perform daily foot/skin check, notify office of any concerning changes.  Follow up in 3 months - A1c  Hyperlipidemia associated with type 2 diabetes mellitus (HCC) Continue diet, exercise and Rosuvastatin 20 mg every day    CKD stage 4 due to type 2 diabetes mellitus (HCC) Increase fluids, avoid NSAIDS, monitor sugars, will monitor -     CBC with Differential/Platelet -     COMPLETE METABOLIC PANEL WITH GFR  Essential hypertension - continue medications- Ziac 2.5/6.25 mg every day - Continue DASH diet, exercise and monitor at home. Call if greater than 130/80.  Go to the ER if any chest pain, shortness of breath, nausea, dizziness, severe HA, changes vision/speech   Atherosclerosis of aorta (HCC) Control BP, weight, blood sugars and cholesterol  Neuropathy due to type 2 diabetes mellitus (HCC)/ Corn on toe Perform daily foot/skin check, notify office of any concerning changes.  Currently controlled without medication Continue to follow with Dr. Georgana Dennis to use corn pads on toe to allow for healing  Body mass index (BMI) 19.9 or less, adult Advised to eat nutrient dense, high calorie/ high protein foods.   Medication management Medications reviewed and Questions answered       Notify office for further evaluation and treatment, questions or concerns if s/s fail to improve. The risks and benefits of my recommendations, as well as other treatment options were discussed with the patient today.  Questions were answered.  Further disposition pending results of labs. Discussed med's effects and SE's.    Over 15 minutes of exam, counseling, chart review, and critical decision making was performed.    Future Appointments  Date Time Provider Department Center  06/08/2023 11:15 AM [name], [name], DPM [facility]     ------------------------------------------------------------------------------------------------------------------   HPI BP 132/60   Pulse 60   Temp (!) 97.5 F (36.4 C)   Ht 5\' 3"  (1.6 m)   Wt 118 lb 3.2 oz (53.6 kg)   SpO2 96%   BMI 20.94 kg/m   Pt was to follow up in 2 weeks from 02/21/23 visit to discuss blood sugars and treatment.  She is just now having her follow up. She was to bring blood sugar log to determine blood sugar values and adjustment of medications as last A1c was 11.2 when using Metformin 500 mg every day and Humalog 5-12 units in am and 5-15 units at night. Blood sugars last night 300 she had more sweets due to christmas. Blood sugar fasting was 200. Blood sugars are fluctuating but did not bring her blood sugar log. She is not sure why she stopped her Basaglar Today's visit  is now 2 months from last appointment.   BP well controlled with Ziac 2.5/6.25 mg every day. Today BP is:132/60 BP Readings from Last 3 Encounters:  04/20/23 132/60  02/21/23 (!) 148/72  01/24/23 (!) 169/70  Denies headaches, chest pain, shortness of breath and dizziness    BMI is Body mass index is 20.94 kg/m., she has not been working on diet  and exercise. She has been eating better. Her sons are taking shifts staying with her.  She has been drinking [name], has had more fruit cake and sweets.  Wt Readings from Last 3 Encounters:  04/20/23 118 lb 3.2 oz (53.6 kg)  02/21/23 115 lb (52.2 kg)  01/23/23 111 lb 1.8 oz (50.4 kg)   Her last A1c was 11.2. She is using Humalog 5-10 units TID per sliding scale. Metformin 500 mg every day. Not using Basaglar  insulin, but not sure why. Blood sugars have been running in 100-200 Lab Results  Component Value Date   HGBA1C 11.2 (H) 02/21/2023   HGBA1C 12.6 (H) 11/03/2022   HGBA1C CANCELED 09/22/2022   Lab Results  Component Value Date   MICROALBUR 3.8 12/07/2021   LDLCALC 34 02/21/2023   CREATININE 1.72 (H) 02/21/2023    She has been encouraged to push more fluids. Stressed importance of drinking more water- states has not been good about drinking throughout the day. Lab Results  Component Value Date   EGFR 28 (L) 02/21/2023    Past Medical History:  Diagnosis Date   Arthritis    Chronic back pain    Cystoid macular edema of right eye 08/19/2019   Diabetic neuropathy (HCC)    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) 11/18/2019   Hypercholesteremia    Hypertension    Liver lesion    Neuropathy    Type II or unspecified type diabetes mellitus without mention of complication, not stated as uncontrolled    Vitamin D deficiency    Wears glasses      Allergies  Allergen Reactions   Ciprofloxacin     Dizziness    Morphine And Codeine Other (See Comments)    fuzzy feeling    Current Outpatient Medications on File Prior to Visit  Medication Sig   aspirin EC 81 MG tablet Take 81 mg by mouth daily. Swallow whole.   bisoprolol-hydrochlorothiazide (ZIAC) 2.5-6.25 MG tablet Take  1 tablet  Daily  for BP                                /                                                                   TAKE                                         BY                                                 MOUTH   Blood Glucose Calibration (ACCU-CHEK AVIVA) SOLN Check blood sugar 3 times daily-DX-E11.22   Blood Glucose Monitoring Suppl DEVI Use to test blood sugar three times a day   Cholecalciferol (VITAMIN D3) 2000 UNITS TABS Take 1 capsule by mouth 2 (two) times daily. PRN   COMFORT EZ PEN NEEDLES 32G X 4 MM MISC  USE 2 TIMES DAILY AS  DIRECTED , 1 PEN NEEDLE PER INJECTION.   Continuous Glucose Sensor (FREESTYLE LIBRE SENSOR SYSTEM) MISC Check blood sugar 3 times a day. Dx:E11.8, Z79.4   Cyanocobalamin (B-12) 2500 MCG TABS Take by mouth daily.   docusate sodium (COLACE) 100 MG capsule Take 1 capsule twice daily as needed for constipation with a bottle of water.   glucose blood (ONETOUCH VERIO) test strip USE 1 STRIP TO CHECK GLUCOSE THREE TIMES DAILY AS DIRECTED   Lancet Devices (ADJUSTABLE LANCING DEVICE) MISC CHECK BLOOD SUGAR 3 TIMES A DAY   Lancets MISC Use to test blood sugar three times a day   linaclotide (LINZESS) 72 MCG capsule Start taking 1 cap daily for constipation.   meclizine (ANTIVERT) 25 MG tablet Take 0.5-1 tablets (12.5-25 mg total) by mouth 3 (three) times daily as needed for dizziness.   Melatonin 10 MG TABS Take by mouth at bedtime.   metFORMIN (GLUCOPHAGE-XR) 500 MG 24 hr tablet Take 1 tablet 1 x /day with Meals for Diabetes.   Multiple Vitamins-Minerals (CENTRUM SILVER 50+WOMEN) TABS Take 1 tablet by mouth daily.   Multiple Vitamins-Minerals (HAIR SKIN AND NAILS FORMULA PO) Take by mouth daily.   rosuvastatin (CRESTOR) 20 MG tablet Take   1 tablet  Daily for Cholesterol                                                                 /                                                          TAKE                                              BY                                        MOUTH                                      ONCE ? DAILY (Patient taking differently: Take 20 mg by mouth daily.)   traMADol (ULTRAM) 50 MG tablet Take 50 mg by mouth every 6 (six) hours as needed for moderate pain. Pt only takes 2 pills per night   triamcinolone (KENALOG) 0.025 % ointment Apply 1 Application topically 2 (two) times daily.   Zinc 50 MG TABS Take by mouth as needed.   dorzolamide-timolol (COSOPT) 22.3-6.8 MG/ML ophthalmic solution Place 1 drop into both eyes 2 (two) times daily.   EASY COMFORT INSULIN SYRINGE 30G X  1/2" 0.5 ML MISC    insulin lispro (HUMALOG) 100 UNIT/ML injection Inject 5-10 Units into the skin 3 (three) times daily with meals. Per sliding scale   ipratropium (ATROVENT) 0.03 % nasal spray Place 2  sprays into the nose 3 (three) times daily.   latanoprost (XALATAN) 0.005 % ophthalmic solution Place 1 drop into both eyes at bedtime. (Patient not taking: Reported on 04/20/2023)   No current facility-administered medications on file prior to visit.   Health Maintenance  Topic Date Due   Zoster Vaccines- Shingrix (1 of 2) Never done   COVID-19 Vaccine (3 - Pfizer risk series) 09/10/2019   DTaP/Tdap/Td (2 - Tdap) 01/03/2021   OPHTHALMOLOGY EXAM  08/25/2022   HEMOGLOBIN A1C  08/22/2023   Medicare Annual Wellness (AWV)  09/22/2023   FOOT EXAM  02/21/2024   Pneumonia Vaccine 20+ Years old  Completed   INFLUENZA VACCINE  Completed   DEXA SCAN  Completed   HPV VACCINES  Aged Out     ROS: all negative except what is noted in the HPI.   Physical Exam:  BP 132/60   Pulse 60   Temp (!) 97.5 F (36.4 C)   Ht 5\' 3"  (1.6 m)   Wt 118 lb 3.2 oz (53.6 kg)   SpO2 96%   BMI 20.94 kg/m   General Appearance: NAD.  Awake, conversant and cooperative. Eyes: PERRLA, EOMs intact.  Sclera white.  Conjunctiva without erythema. Sinuses: No frontal/maxillary tenderness.  No nasal discharge. Nares patent.  ENT/Mouth: Ext aud canals clear.  Bilateral TMs without erythema or bulging. Hearing intact.  Posterior pharynx without swelling or exudate.   Neck: Supple.  No masses, nodules or thyromegaly. Respiratory: Effort is regular with non-labored breathing. Breath sounds are equal bilaterally without rales, rhonchi, wheezing or stridor.  Cardio: RRR with no MRGs. Brisk peripheral pulses without edema.  Abdomen: Active BS in all four quadrants.  Soft and non-tender without guarding, rebound tenderness, hernias or masses. Lymphatics: Non tender without lymphadenopathy.  Musculoskeletal: Full ROM, 5/5  strength, normal ambulation.  No clubbing or cyanosis. Skin:Warm and dry. Right little toe has corn at base. Onychomycosis of great toenails bilaterally Neuro: CN II-XII grossly normal. Normal muscle tone without cerebellar symptoms and positive decreased sensation mid shin and feet/toes bilaterally to monofilament   Psych: AO X 3,  appropriate mood and affect, insight and judgment.     Raynelle Dick, NP 11:46 AM [facility]

## 2023-04-20 ENCOUNTER — Encounter: Payer: Self-pay | Admitting: Nurse Practitioner

## 2023-04-20 ENCOUNTER — Ambulatory Visit (INDEPENDENT_AMBULATORY_CARE_PROVIDER_SITE_OTHER): Payer: Medicare Other | Admitting: Nurse Practitioner

## 2023-04-20 ENCOUNTER — Other Ambulatory Visit: Payer: Medicare Other

## 2023-04-20 VITALS — BP 132/60 | HR 60 | Temp 97.5°F | Ht 63.0 in | Wt 118.2 lb

## 2023-04-20 DIAGNOSIS — Z794 Long term (current) use of insulin: Secondary | ICD-10-CM

## 2023-04-20 DIAGNOSIS — Z681 Body mass index (BMI) 19 or less, adult: Secondary | ICD-10-CM

## 2023-04-20 DIAGNOSIS — E785 Hyperlipidemia, unspecified: Secondary | ICD-10-CM

## 2023-04-20 DIAGNOSIS — E1122 Type 2 diabetes mellitus with diabetic chronic kidney disease: Secondary | ICD-10-CM

## 2023-04-20 DIAGNOSIS — E1169 Type 2 diabetes mellitus with other specified complication: Secondary | ICD-10-CM | POA: Diagnosis not present

## 2023-04-20 DIAGNOSIS — I10 Essential (primary) hypertension: Secondary | ICD-10-CM

## 2023-04-20 DIAGNOSIS — N184 Chronic kidney disease, stage 4 (severe): Secondary | ICD-10-CM | POA: Diagnosis not present

## 2023-04-20 DIAGNOSIS — I700 Atherosclerosis of aorta: Secondary | ICD-10-CM | POA: Diagnosis not present

## 2023-04-20 DIAGNOSIS — L84 Corns and callosities: Secondary | ICD-10-CM

## 2023-04-20 MED ORDER — INSULIN DETEMIR 100 UNIT/ML ~~LOC~~ SOLN: 10.0000 [IU] | Freq: Every day | SUBCUTANEOUS | 3 refills | Status: DC

## 2023-04-20 NOTE — Patient Instructions (Addendum)
Start levemir 10 units every morning Continue Humalog 5-15 units twice a day sliding scale depending on blood sugar results. No humalog if blood sugar is less than 100  If blood sugars are consistently less than 100 notify the office and will adjust levemir  Corn patches on right medial little toe  Diabetes Mellitus and Nutrition, Adult When you have diabetes, or diabetes mellitus, it is very important to have healthy eating habits because your blood sugar (glucose) levels are greatly affected by what you eat and drink. Eating healthy foods in the right amounts, at about the same times every day, can help you: Manage your blood glucose. Lower your risk of heart disease. Improve your blood pressure. Reach or maintain a healthy weight. What can affect my meal plan? Every person with diabetes is different, and each person has different needs for a meal plan. Your health care provider may recommend that you work with a dietitian to make a meal plan that is best for you. Your meal plan may vary depending on factors such as: The calories you need. The medicines you take. Your weight. Your blood glucose, blood pressure, and cholesterol levels. Your activity level. Other health conditions you have, such as heart or kidney disease. How do carbohydrates affect me? Carbohydrates, also called carbs, affect your blood glucose level more than any other type of food. Eating carbs raises the amount of glucose in your blood. It is important to know how many carbs you can safely have in each meal. This is different for every person. Your dietitian can help you calculate how many carbs you should have at each meal and for each snack. How does alcohol affect me? Alcohol can cause a decrease in blood glucose (hypoglycemia), especially if you use insulin or take certain diabetes medicines by mouth. Hypoglycemia can be a life-threatening condition. Symptoms of hypoglycemia, such as sleepiness, dizziness, and  confusion, are similar to symptoms of having too much alcohol. Do not drink alcohol if: Your health care provider tells you not to drink. You are pregnant, may be pregnant, or are planning to become pregnant. If you drink alcohol: Limit how much you have to: 0-1 drink a day for women. 0-2 drinks a day for men. Know how much alcohol is in your drink. In the [place] one drink equals one 12 oz bottle of beer (355 mL), one 5 oz glass of wine (148 mL), or one 1 oz glass of hard liquor (44 mL). Keep yourself hydrated with water, diet soda, or unsweetened iced tea. Keep in mind that regular soda, juice, and other mixers may contain a lot of sugar and must be counted as carbs. What are tips for following this plan?  Reading food labels Start by checking the serving size on the Nutrition Facts label of packaged foods and drinks. The number of calories and the amount of carbs, fats, and other nutrients listed on the label are based on one serving of the item. Many items contain more than one serving per package. Check the total grams (g) of carbs in one serving. Check the number of grams of saturated fats and trans fats in one serving. Choose foods that have a low amount or none of these fats. Check the number of milligrams (mg) of salt (sodium) in one serving. Most people should limit total sodium intake to less than 2,300 mg per day. Always check the nutrition information of foods labeled as "low-fat" or "nonfat." These foods may be higher in added sugar or  refined carbs and should be avoided. Talk to your dietitian to identify your daily goals for nutrients listed on the label. Shopping Avoid buying canned, pre-made, or processed foods. These foods tend to be high in fat, sodium, and added sugar. Shop around the outside edge of the grocery store. This is where you will most often find fresh fruits and vegetables, bulk grains, fresh meats, and fresh dairy products. Cooking Use low-heat cooking methods,  such as baking, instead of high-heat cooking methods, such as deep frying. Cook using healthy oils, such as olive, canola, or sunflower oil. Avoid cooking with butter, cream, or high-fat meats. Meal planning Eat meals and snacks regularly, preferably at the same times every day. Avoid going long periods of time without eating. Eat foods that are high in fiber, such as fresh fruits, vegetables, beans, and whole grains. Eat 4-6 oz (112-168 g) of lean protein each day, such as lean meat, chicken, fish, eggs, or tofu. One ounce (oz) (28 g) of lean protein is equal to: 1 oz (28 g) of meat, chicken, or fish. 1 egg.  cup (62 g) of tofu. Eat some foods each day that contain healthy fats, such as avocado, nuts, seeds, and fish. What foods should I eat? Fruits Berries. Apples. Oranges. Peaches. Apricots. Plums. Grapes. Mangoes. Papayas. Pomegranates. Kiwi. Cherries. Vegetables Leafy greens, including lettuce, spinach, kale, chard, collard greens, mustard greens, and cabbage. Beets. Cauliflower. Broccoli. Carrots. Green beans. Tomatoes. Peppers. Onions. Cucumbers. Brussels sprouts. Grains Whole grains, such as whole-wheat or whole-grain bread, crackers, tortillas, cereal, and pasta. Unsweetened oatmeal. Quinoa. Brown or wild rice. Meats and other proteins Seafood. Poultry without skin. Lean cuts of poultry and beef. Tofu. Nuts. Seeds. Dairy Low-fat or fat-free dairy products such as milk, yogurt, and cheese. The items listed above may not be a complete list of foods and beverages you can eat and drink. Contact a dietitian for more information. What foods should I avoid? Fruits Fruits canned with syrup. Vegetables Canned vegetables. Frozen vegetables with butter or cream sauce. Grains Refined white flour and flour products such as bread, pasta, snack foods, and cereals. Avoid all processed foods. Meats and other proteins Fatty cuts of meat. Poultry with skin. Breaded or fried meats. Processed  meat. Avoid saturated fats. Dairy Full-fat yogurt, cheese, or milk. Beverages Sweetened drinks, such as soda or iced tea. The items listed above may not be a complete list of foods and beverages you should avoid. Contact a dietitian for more information. Questions to ask a health care provider Do I need to meet with a certified diabetes care and education specialist? Do I need to meet with a dietitian? What number can I call if I have questions? When are the best times to check my blood glucose? Where to find more information: American Diabetes Association: diabetes.org Academy of Nutrition and Dietetics: eatright[employer] of Diabetes and Digestive and Kidney Diseases: StageSync.si Association of Diabetes Care & Education Specialists: diabeteseducator.org Summary It is important to have healthy eating habits because your blood sugar (glucose) levels are greatly affected by what you eat and drink. It is important to use alcohol carefully. A healthy meal plan will help you manage your blood glucose and lower your risk of heart disease. Your health care provider may recommend that you work with a dietitian to make a meal plan that is best for you. This information is not intended to replace advice given to you by your health care provider. Make sure you discuss any questions you have  with your health care provider. Document Revised: 11/13/2019 Document Reviewed: 11/13/2019 Elsevier Patient Education  2024 [employer].

## 2023-04-24 ENCOUNTER — Other Ambulatory Visit: Payer: Medicare Other

## 2023-04-24 DIAGNOSIS — E1122 Type 2 diabetes mellitus with diabetic chronic kidney disease: Secondary | ICD-10-CM | POA: Diagnosis not present

## 2023-04-24 DIAGNOSIS — Z794 Long term (current) use of insulin: Secondary | ICD-10-CM | POA: Diagnosis not present

## 2023-04-24 DIAGNOSIS — E118 Type 2 diabetes mellitus with unspecified complications: Secondary | ICD-10-CM | POA: Diagnosis not present

## 2023-04-24 DIAGNOSIS — N184 Chronic kidney disease, stage 4 (severe): Secondary | ICD-10-CM | POA: Diagnosis not present

## 2023-04-25 LAB — COMPLETE METABOLIC PANEL WITH GFR
AG Ratio: 1.3 (calc) (ref 1.0–2.5)
ALT: 21 U/L (ref 6–29)
AST: 26 U/L (ref 10–35)
Albumin: 4.2 g/dL (ref 3.6–5.1)
Alkaline phosphatase (APISO): 70 U/L (ref 37–153)
BUN/Creatinine Ratio: 21 (calc) (ref 6–22)
BUN: 38 mg/dL — ABNORMAL HIGH (ref 7–25)
CO2: 34 mmol/L — ABNORMAL HIGH (ref 20–32)
Calcium: 10 mg/dL (ref 8.6–10.4)
Chloride: 100 mmol/L (ref 98–110)
Creat: 1.8 mg/dL — ABNORMAL HIGH (ref 0.60–0.95)
Globulin: 3.3 g/dL (ref 1.9–3.7)
Glucose, Bld: 117 mg/dL — ABNORMAL HIGH (ref 65–99)
Potassium: 4.8 mmol/L (ref 3.5–5.3)
Sodium: 142 mmol/L (ref 135–146)
Total Bilirubin: 0.4 mg/dL (ref 0.2–1.2)
Total Protein: 7.5 g/dL (ref 6.1–8.1)
eGFR: 26 mL/min/{1.73_m2} — ABNORMAL LOW (ref >=60)

## 2023-04-25 LAB — CBC WITH DIFFERENTIAL/PLATELET
Absolute Lymphocytes: 3929 {cells}/uL — ABNORMAL HIGH (ref 850–3900)
Absolute Monocytes: 990 {cells}/uL — ABNORMAL HIGH (ref 200–950)
Basophils Absolute: 61 {cells}/uL (ref 0–200)
Basophils Relative: 0.6 %
Eosinophils Absolute: 465 {cells}/uL (ref 15–500)
Eosinophils Relative: 4.6 %
HCT: 41 % (ref 35.0–45.0)
Hemoglobin: 13.3 g/dL (ref 11.7–15.5)
MCH: 31.5 pg (ref 27.0–33.0)
MCHC: 32.4 g/dL (ref 32.0–36.0)
MCV: 97.2 fL (ref 80.0–100.0)
MPV: 12.6 fL — ABNORMAL HIGH (ref 7.5–12.5)
Monocytes Relative: 9.8 %
Neutro Abs: 4656 {cells}/uL (ref 1500–7800)
Neutrophils Relative %: 46.1 %
Platelets: 219 10*3/uL (ref 140–400)
RBC: 4.22 10*6/uL (ref 3.80–5.10)
RDW: 11.5 % (ref 11.0–15.0)
Total Lymphocyte: 38.9 %
WBC: 10.1 10*3/uL (ref 3.8–10.8)

## 2023-04-25 LAB — HEMOGLOBIN A1C W/OUT EAG: Hgb A1c MFr Bld: 10.5 %{Hb} — ABNORMAL HIGH (ref <5.7)

## 2023-05-10 DIAGNOSIS — H61001 Unspecified perichondritis of right external ear: Secondary | ICD-10-CM | POA: Diagnosis not present

## 2023-05-10 DIAGNOSIS — L080 Pyoderma: Secondary | ICD-10-CM | POA: Diagnosis not present

## 2023-06-07 ENCOUNTER — Encounter: Payer: Self-pay | Admitting: Podiatry

## 2023-06-07 ENCOUNTER — Ambulatory Visit: Payer: Medicare Other | Admitting: Podiatry

## 2023-06-07 DIAGNOSIS — E1149 Type 2 diabetes mellitus with other diabetic neurological complication: Secondary | ICD-10-CM

## 2023-06-07 DIAGNOSIS — M79675 Pain in left toe(s): Secondary | ICD-10-CM | POA: Diagnosis not present

## 2023-06-07 DIAGNOSIS — B351 Tinea unguium: Secondary | ICD-10-CM

## 2023-06-07 DIAGNOSIS — L84 Corns and callosities: Secondary | ICD-10-CM

## 2023-06-07 DIAGNOSIS — M7751 Other enthesopathy of right foot: Secondary | ICD-10-CM

## 2023-06-07 DIAGNOSIS — M79674 Pain in right toe(s): Secondary | ICD-10-CM | POA: Diagnosis not present

## 2023-06-07 DIAGNOSIS — E1140 Type 2 diabetes mellitus with diabetic neuropathy, unspecified: Secondary | ICD-10-CM | POA: Diagnosis not present

## 2023-06-07 MED ORDER — TRIAMCINOLONE ACETONIDE 10 MG/ML IJ SUSP
10.0000 mg | Freq: Once | INTRAMUSCULAR | Status: AC
  Administered 2023-06-07: 10 mg via INTRA_ARTICULAR

## 2023-06-07 NOTE — Progress Notes (Signed)
Subjective:   Patient ID: [name], female   DOB: 89 y.o.   MRN: [id number]   HPI Patient presents with several lesions right foot that are painful when pressed around the fifth digit and the fifth metatarsal base and inflammation fluid at the fifth metatarsal head right and also elongated nailbeds 1-5 both feet that they cannot cut with patient having long-term diabetic neuropathy   ROS      Objective:  Physical Exam  Reduced neurological both sharp dull vibratory bilateral with the patient noted to have thick nailbeds 1-5 both feet and is also noted to have inflammation fluid fifth MPJ and then keratotic lesion x 2 on the right foot painful when pressed     Assessment:  High risk patient with inflammatory capsulitis mycotic nail infection and chronic lesions with diabetic issues long-term neuropathy     Plan:  H&P reviewed all conditions with her and caregiver and at this point I went ahead and I debrided lesions carefully no iatrogenic bleeding and I debrided nailbeds 1-5 both feet did sterile prep and injected the capsule fifth MPJ right 3 mg dexamethasone Kenalog 5 mg Xylocaine applied sterile dress

## 2023-06-08 ENCOUNTER — Ambulatory Visit: Payer: Medicare Other | Admitting: Podiatry

## 2023-10-12 ENCOUNTER — Inpatient Hospital Stay (HOSPITAL_BASED_OUTPATIENT_CLINIC_OR_DEPARTMENT_OTHER)
Admission: EM | Admit: 2023-10-12 | Discharge: 2023-10-14 | DRG: 690 | Disposition: A | Discharge status: Home / Self Care | Attending: Family Medicine | Admitting: Family Medicine

## 2023-10-12 ENCOUNTER — Emergency Department (HOSPITAL_BASED_OUTPATIENT_CLINIC_OR_DEPARTMENT_OTHER)

## 2023-10-12 ENCOUNTER — Encounter (HOSPITAL_BASED_OUTPATIENT_CLINIC_OR_DEPARTMENT_OTHER): Payer: Self-pay

## 2023-10-12 ENCOUNTER — Other Ambulatory Visit: Payer: Self-pay

## 2023-10-12 DIAGNOSIS — Z7982 Long term (current) use of aspirin: Secondary | ICD-10-CM | POA: Diagnosis not present

## 2023-10-12 DIAGNOSIS — Z833 Family history of diabetes mellitus: Secondary | ICD-10-CM

## 2023-10-12 DIAGNOSIS — E1140 Type 2 diabetes mellitus with diabetic neuropathy, unspecified: Secondary | ICD-10-CM | POA: Diagnosis present

## 2023-10-12 DIAGNOSIS — Z602 Problems related to living alone: Secondary | ICD-10-CM | POA: Diagnosis present

## 2023-10-12 DIAGNOSIS — N1832 Chronic kidney disease, stage 3b: Secondary | ICD-10-CM | POA: Diagnosis present

## 2023-10-12 DIAGNOSIS — G8929 Other chronic pain: Secondary | ICD-10-CM | POA: Diagnosis present

## 2023-10-12 DIAGNOSIS — E118 Type 2 diabetes mellitus with unspecified complications: Secondary | ICD-10-CM | POA: Diagnosis not present

## 2023-10-12 DIAGNOSIS — Z8051 Family history of malignant neoplasm of kidney: Secondary | ICD-10-CM

## 2023-10-12 DIAGNOSIS — I129 Hypertensive chronic kidney disease with stage 1 through stage 4 chronic kidney disease, or unspecified chronic kidney disease: Secondary | ICD-10-CM | POA: Diagnosis present

## 2023-10-12 DIAGNOSIS — M549 Dorsalgia, unspecified: Secondary | ICD-10-CM | POA: Diagnosis present

## 2023-10-12 DIAGNOSIS — Z885 Allergy status to narcotic agent status: Secondary | ICD-10-CM

## 2023-10-12 DIAGNOSIS — E872 Acidosis, unspecified: Secondary | ICD-10-CM | POA: Diagnosis present

## 2023-10-12 DIAGNOSIS — E7800 Pure hypercholesterolemia, unspecified: Secondary | ICD-10-CM | POA: Diagnosis present

## 2023-10-12 DIAGNOSIS — R6520 Severe sepsis without septic shock: Secondary | ICD-10-CM | POA: Diagnosis present

## 2023-10-12 DIAGNOSIS — E876 Hypokalemia: Secondary | ICD-10-CM | POA: Diagnosis present

## 2023-10-12 DIAGNOSIS — H353222 Exudative age-related macular degeneration, left eye, with inactive choroidal neovascularization: Secondary | ICD-10-CM | POA: Diagnosis present

## 2023-10-12 DIAGNOSIS — Z800 Family history of malignant neoplasm of digestive organs: Secondary | ICD-10-CM

## 2023-10-12 DIAGNOSIS — A419 Sepsis, unspecified organism: Principal | ICD-10-CM

## 2023-10-12 DIAGNOSIS — E1122 Type 2 diabetes mellitus with diabetic chronic kidney disease: Secondary | ICD-10-CM | POA: Diagnosis present

## 2023-10-12 DIAGNOSIS — Z807 Family history of other malignant neoplasms of lymphoid, hematopoietic and related tissues: Secondary | ICD-10-CM

## 2023-10-12 DIAGNOSIS — Z794 Long term (current) use of insulin: Secondary | ICD-10-CM

## 2023-10-12 DIAGNOSIS — M1990 Unspecified osteoarthritis, unspecified site: Secondary | ICD-10-CM | POA: Diagnosis present

## 2023-10-12 DIAGNOSIS — B9620 Unspecified Escherichia coli [E. coli] as the cause of diseases classified elsewhere: Secondary | ICD-10-CM | POA: Diagnosis present

## 2023-10-12 DIAGNOSIS — Z79899 Other long term (current) drug therapy: Secondary | ICD-10-CM | POA: Diagnosis not present

## 2023-10-12 DIAGNOSIS — E1169 Type 2 diabetes mellitus with other specified complication: Secondary | ICD-10-CM | POA: Diagnosis present

## 2023-10-12 DIAGNOSIS — I10 Essential (primary) hypertension: Secondary | ICD-10-CM | POA: Diagnosis present

## 2023-10-12 DIAGNOSIS — Z881 Allergy status to other antibiotic agents status: Secondary | ICD-10-CM

## 2023-10-12 DIAGNOSIS — Z7984 Long term (current) use of oral hypoglycemic drugs: Secondary | ICD-10-CM | POA: Diagnosis not present

## 2023-10-12 DIAGNOSIS — N390 Urinary tract infection, site not specified: Principal | ICD-10-CM | POA: Diagnosis present

## 2023-10-12 DIAGNOSIS — E869 Volume depletion, unspecified: Secondary | ICD-10-CM | POA: Diagnosis present

## 2023-10-12 DIAGNOSIS — Z823 Family history of stroke: Secondary | ICD-10-CM

## 2023-10-12 DIAGNOSIS — N3000 Acute cystitis without hematuria: Secondary | ICD-10-CM | POA: Diagnosis not present

## 2023-10-12 LAB — CBC WITH DIFFERENTIAL/PLATELET
Abs Immature Granulocytes: 0.03 10*3/uL (ref 0.00–0.07)
Basophils Absolute: 0.1 10*3/uL (ref 0.0–0.1)
Basophils Relative: 1 %
Eosinophils Absolute: 0.2 10*3/uL (ref 0.0–0.5)
Eosinophils Relative: 2 %
HCT: 35.9 % — ABNORMAL LOW (ref 36.0–46.0)
Hemoglobin: 11.9 g/dL — ABNORMAL LOW (ref 12.0–15.0)
Immature Granulocytes: 0 %
Lymphocytes Relative: 23 %
Lymphs Abs: 2.8 10*3/uL (ref 0.7–4.0)
MCH: 31.5 pg (ref 26.0–34.0)
MCHC: 33.1 g/dL (ref 30.0–36.0)
MCV: 95 fL (ref 80.0–100.0)
Monocytes Absolute: 1 10*3/uL (ref 0.1–1.0)
Monocytes Relative: 9 %
Neutro Abs: 7.9 10*3/uL — ABNORMAL HIGH (ref 1.7–7.7)
Neutrophils Relative %: 65 %
Platelets: 329 10*3/uL (ref 150–400)
RBC: 3.78 MIL/uL — ABNORMAL LOW (ref 3.87–5.11)
RDW: 13.6 % (ref 11.5–15.5)
WBC: 12.1 10*3/uL — ABNORMAL HIGH (ref 4.0–10.5)
nRBC: 0 % (ref 0.0–0.2)

## 2023-10-12 LAB — URINALYSIS, W/ REFLEX TO CULTURE (INFECTION SUSPECTED)
Bilirubin Urine: NEGATIVE
Glucose, UA: 250 mg/dL — AB
Ketones, ur: NEGATIVE mg/dL
Nitrite: NEGATIVE
Protein, ur: 100 mg/dL — AB
Specific Gravity, Urine: 1.017 (ref 1.005–1.030)
WBC, UA: >50 WBC/hpf (ref 0–5)
pH: 5.5 (ref 5.0–8.0)

## 2023-10-12 LAB — COMPREHENSIVE METABOLIC PANEL WITH GFR
ALT: 13 U/L (ref 0–44)
AST: 27 U/L (ref 15–41)
Albumin: 3.9 g/dL (ref 3.5–5.0)
Alkaline Phosphatase: 63 U/L (ref 38–126)
Anion gap: 17 — ABNORMAL HIGH (ref 5–15)
BUN: 24 mg/dL — ABNORMAL HIGH (ref 8–23)
CO2: 26 mmol/L (ref 22–32)
Calcium: 10.4 mg/dL — ABNORMAL HIGH (ref 8.9–10.3)
Chloride: 96 mmol/L — ABNORMAL LOW (ref 98–111)
Creatinine, Ser: 1.44 mg/dL — ABNORMAL HIGH (ref 0.44–1.00)
GFR, Estimated: 34 mL/min — ABNORMAL LOW (ref >60)
Glucose, Bld: 240 mg/dL — ABNORMAL HIGH (ref 70–99)
Potassium: 4.8 mmol/L (ref 3.5–5.1)
Sodium: 139 mmol/L (ref 135–145)
Total Bilirubin: 0.3 mg/dL (ref 0.0–1.2)
Total Protein: 7.5 g/dL (ref 6.5–8.1)

## 2023-10-12 LAB — PROTIME-INR
INR: 1 (ref 0.8–1.2)
Prothrombin Time: 13.3 s (ref 11.4–15.2)

## 2023-10-12 LAB — LACTIC ACID, PLASMA
Lactic Acid, Venous: 4.9 mmol/L (ref 0.5–1.9)
Lactic Acid, Venous: 2.9 mmol/L (ref 0.5–1.9)

## 2023-10-12 LAB — RESP PANEL BY RT-PCR (RSV, FLU A&B, COVID)  RVPGX2
Influenza A by PCR: NEGATIVE
Influenza B by PCR: NEGATIVE
Resp Syncytial Virus by PCR: NEGATIVE
SARS Coronavirus 2 by RT PCR: NEGATIVE

## 2023-10-12 LAB — GLUCOSE, CAPILLARY: Glucose-Capillary: 201 mg/dL — ABNORMAL HIGH (ref 70–99)

## 2023-10-12 MED ORDER — ENOXAPARIN SODIUM 30 MG/0.3ML IJ SOSY
30.0000 mg | PREFILLED_SYRINGE | INTRAMUSCULAR | Status: DC
  Administered 2023-10-13 – 2023-10-14 (×2): 30 mg via SUBCUTANEOUS
  Filled 2023-10-12 (×2): qty 0.3

## 2023-10-12 MED ORDER — ACETAMINOPHEN 325 MG PO TABS
650.0000 mg | ORAL_TABLET | Freq: Four times a day (QID) | ORAL | Status: DC | PRN
Start: 2023-10-12 — End: 2023-10-14

## 2023-10-12 MED ORDER — INSULIN ASPART 100 UNIT/ML IJ SOLN
0.0000 [IU] | Freq: Three times a day (TID) | INTRAMUSCULAR | Status: DC
  Administered 2023-10-13: 3 [IU] via SUBCUTANEOUS

## 2023-10-12 MED ORDER — LIDOCAINE 5 % EX PTCH
1.0000 | MEDICATED_PATCH | CUTANEOUS | Status: DC
  Administered 2023-10-12: 1 via TRANSDERMAL
  Filled 2023-10-12: qty 1

## 2023-10-12 MED ORDER — ONDANSETRON HCL 4 MG/2ML IJ SOLN: 4.0000 mg | Freq: Four times a day (QID) | INTRAMUSCULAR | Status: DC | PRN

## 2023-10-12 MED ORDER — ROSUVASTATIN CALCIUM 10 MG PO TABS
20.0000 mg | ORAL_TABLET | Freq: Every day | ORAL | Status: DC
  Administered 2023-10-13 – 2023-10-14 (×2): 20 mg via ORAL
  Filled 2023-10-12: qty 1
  Filled 2023-10-12: qty 2

## 2023-10-12 MED ORDER — BISOPROLOL FUMARATE 5 MG PO TABS
2.5000 mg | ORAL_TABLET | Freq: Every day | ORAL | Status: DC
  Administered 2023-10-13: 2.5 mg via ORAL
  Filled 2023-10-12: qty 1

## 2023-10-12 MED ORDER — ONDANSETRON HCL 4 MG PO TABS: 4.0000 mg | ORAL_TABLET | Freq: Four times a day (QID) | ORAL | Status: DC | PRN

## 2023-10-12 MED ORDER — MELATONIN 5 MG PO TABS
5.0000 mg | ORAL_TABLET | Freq: Every day | ORAL | Status: DC
  Administered 2023-10-12 – 2023-10-13 (×2): 5 mg via ORAL
  Filled 2023-10-12 (×2): qty 1

## 2023-10-12 MED ORDER — LACTATED RINGERS IV BOLUS (SEPSIS)
500.0000 mL | Freq: Once | INTRAVENOUS | Status: AC
  Administered 2023-10-12: 500 mL via INTRAVENOUS

## 2023-10-12 MED ORDER — LACTATED RINGERS IV BOLUS (SEPSIS)
250.0000 mL | Freq: Once | INTRAVENOUS | Status: AC
  Administered 2023-10-12: 250 mL via INTRAVENOUS

## 2023-10-12 MED ORDER — ASPIRIN 81 MG PO TBEC
81.0000 mg | DELAYED_RELEASE_TABLET | Freq: Every day | ORAL | Status: DC
  Administered 2023-10-13 – 2023-10-14 (×2): 81 mg via ORAL
  Filled 2023-10-12 (×2): qty 1

## 2023-10-12 MED ORDER — INSULIN GLARGINE-YFGN 100 UNIT/ML ~~LOC~~ SOLN
15.0000 [IU] | Freq: Every day | SUBCUTANEOUS | Status: DC
  Administered 2023-10-12 – 2023-10-13 (×2): 15 [IU] via SUBCUTANEOUS
  Filled 2023-10-12 (×3): qty 0.15

## 2023-10-12 MED ORDER — TRAMADOL HCL 50 MG PO TABS: 50.0000 mg | ORAL_TABLET | Freq: Two times a day (BID) | ORAL | Status: DC | PRN

## 2023-10-12 MED ORDER — BASAGLAR KWIKPEN 100 UNIT/ML ~~LOC~~ SOPN: 15.0000 [IU] | PEN_INJECTOR | Freq: Every day | SUBCUTANEOUS | Status: DC

## 2023-10-12 MED ORDER — ACETAMINOPHEN 650 MG RE SUPP
650.0000 mg | Freq: Four times a day (QID) | RECTAL | Status: DC | PRN
Start: 2023-10-12 — End: 2023-10-13

## 2023-10-12 MED ORDER — LACTATED RINGERS IV BOLUS (SEPSIS)
1000.0000 mL | Freq: Once | INTRAVENOUS | Status: AC
  Administered 2023-10-12: 1000 mL via INTRAVENOUS

## 2023-10-12 MED ORDER — LACTATED RINGERS IV SOLN: INTRAVENOUS | Status: DC

## 2023-10-12 MED ORDER — SODIUM CHLORIDE 0.9 % IV SOLN
2.0000 g | INTRAVENOUS | Status: DC
  Administered 2023-10-13: 2 g via INTRAVENOUS
  Filled 2023-10-12: qty 20

## 2023-10-12 MED ORDER — SODIUM CHLORIDE 0.9 % IV SOLN
2.0000 g | Freq: Once | INTRAVENOUS | Status: AC
  Administered 2023-10-12: 2 g via INTRAVENOUS
  Filled 2023-10-12: qty 20

## 2023-10-12 MED ORDER — METOPROLOL TARTRATE 5 MG/5ML IV SOLN
2.5000 mg | Freq: Four times a day (QID) | INTRAVENOUS | Status: DC | PRN
Start: 2023-10-12 — End: 2023-10-14
  Administered 2023-10-13 – 2023-10-14 (×2): 2.5 mg via INTRAVENOUS
  Filled 2023-10-12 (×2): qty 5

## 2023-10-12 NOTE — Assessment & Plan Note (Addendum)
 89 year old presenting to ED with dysuria, shaking and foul smelling urine found to have a white count of 12.1 and lactic acid of 4.9 with urinalysis significant for UTI  -admit to tele  -BC and urine cx pending -continue rocephin  -received 30ml/kg of IVF and continued on maintenance IVF -repeat lactic acid pending, continue to trend  -f/u on culture

## 2023-10-12 NOTE — ED Notes (Signed)
 X2 sets of of blood cultures obtaine before any abx started

## 2023-10-12 NOTE — ED Notes (Signed)
 CRITICAL VALUE STICKER  CRITICAL VALUE:Lactate 4.9  RECEIVER (on-site recipient of call)T Felipe Horton, RN  DATE & TIME NOTIFIED:   MESSENGER (representative from lab):  MD NOTIFIED: Leighton Punches  TIME OF NOTIFICATION:1607  RESPONSE:

## 2023-10-12 NOTE — Assessment & Plan Note (Signed)
 Repeat in AM, if still elevated check PTH

## 2023-10-12 NOTE — ED Provider Notes (Signed)
 [place] EMERGENCY DEPARTMENT AT [facility] Provider Note   CSN: [id number] Arrival date & time: 10/12/23  1501     Patient presents with: Urinary Tract Infection   [name] is a 89 y.o. female.  {Add pertinent medical, surgical, social history, OB history to HPI:2428} 89 year old female presents with urinary symptoms times several days.  Also endorses some flank pain as well.  To go home test that showed that she has a urinary tract infection.  Endorses malaise denies any fever.  Denies any respiratory symptoms.  History of UTI in the past.  Notes some suprapubic pressure.  Not taking antibiotics currently       Prior to Admission medications   Medication Sig Start Date End Date Taking? Authorizing Provider  aspirin  EC 81 MG tablet Take 81 mg by mouth daily. Swallow whole.    [provider]  bisoprolol  (ZEBETA ) 5 MG tablet Take 2.5 mg by mouth daily.    [provider]  bisoprolol -hydrochlorothiazide  (ZIAC ) 2.5-6.25 MG tablet Take  1 tablet  Daily  for BP                                /                                                                   TAKE                                         BY                                                 MOUTH 01/07/23   Vangie Genet, MD  Blood Glucose Calibration (ACCU-CHEK AVIVA) SOLN Check blood sugar 3 times daily-DX-E11.22 03/11/15   Vangie Genet, MD  Blood Glucose Monitoring Suppl DEVI Use to test blood sugar three times a day 04/06/22   Cranford, Tonya, NP  Cholecalciferol (VITAMIN D3) 2000 UNITS TABS Take 1 capsule by mouth 2 (two) times daily. PRN    [provider]  COMFORT EZ PEN NEEDLES 32G X 4 MM MISC USE 2 TIMES DAILY AS DIRECTED , 1 PEN NEEDLE PER INJECTION. 01/14/19   Vangie Genet, MD  Continuous Glucose Sensor (FREESTYLE LIBRE SENSOR SYSTEM) MISC Check blood sugar 3 times a day. Dx:E11.8, Z79.4 11/14/22   Cranford, Tonya, NP  Cyanocobalamin (B-12) 2500 MCG TABS Take by  mouth daily.    [provider]  docusate sodium  (COLACE) 100 MG capsule Take 1 capsule twice daily as needed for constipation with a bottle of water. 08/26/21   [place] Bureau, NP  dorzolamide-timolol (COSOPT) 22.3-6.8 MG/ML ophthalmic solution Place 1 drop into both eyes 2 (two) times daily. 03/31/16   [provider]  EASY COMFORT INSULIN  SYRINGE 30G X 1/2 0.5 ML MISC  10/01/15   [provider]  glucose blood (ONETOUCH VERIO) test strip USE 1 STRIP TO CHECK GLUCOSE THREE TIMES DAILY AS DIRECTED 05/23/22  Langley Pippin, NP  insulin  detemir (LEVEMIR ) 100 UNIT/ML injection Inject 0.1 mLs (10 Units total) into the skin daily. 04/20/23   Wilkinson, Dana E, FNP  insulin  lispro (HUMALOG ) 100 UNIT/ML injection Inject 5-10 Units into the skin 3 (three) times daily with meals. Per sliding scale 02/27/17   [provider]  Lancet Devices (ADJUSTABLE LANCING DEVICE) MISC CHECK BLOOD SUGAR 3 TIMES A DAY 09/07/20   Vangie Genet, MD  Lancets MISC Use to test blood sugar three times a day 04/06/22   Cranford, Tonya, NP  latanoprost (XALATAN) 0.005 % ophthalmic solution Place 1 drop into both eyes at bedtime. 03/28/16   [provider]  linaclotide  (LINZESS ) 72 MCG capsule Start taking 1 cap daily for constipation. 10/19/21   [place] Bureau, NP  meclizine  (ANTIVERT ) 25 MG tablet Take 0.5-1 tablets (12.5-25 mg total) by mouth 3 (three) times daily as needed for dizziness. 12/10/21   Butler, Michael C, MD  Melatonin 10 MG TABS Take by mouth at bedtime.    [provider]  metFORMIN  (GLUCOPHAGE -XR) 500 MG 24 hr tablet Take 1 tablet 1 x /day with Meals for Diabetes. 11/14/22   Langley Pippin, NP  Multiple Vitamins-Minerals (CENTRUM SILVER 50+WOMEN) TABS Take 1 tablet by mouth daily.    [provider]  Multiple Vitamins-Minerals (HAIR SKIN AND NAILS FORMULA PO) Take by mouth daily.    [provider]  rosuvastatin  (CRESTOR ) 20 MG tablet Take   1  tablet  Daily for Cholesterol                                                                 /                                                          TAKE                                              BY                                        MOUTH                                      ONCE ? DAILY Patient taking differently: Take 20 mg by mouth daily. 08/26/22   Vangie Genet, MD  traMADol  (ULTRAM ) 50 MG tablet Take 50 mg by mouth every 6 (six) hours as needed for moderate pain. Pt only takes 2 pills per night    [provider]  triamcinolone  (KENALOG ) 0.025 % ointment Apply 1 Application topically 2 (two) times daily. 11/14/22   Cranford, Tonya, NP  Zinc 50 MG TABS Take by mouth as needed.    [provider]    Allergies: Ciprofloxacin  and Morphine and codeine  Review of Systems  All other systems reviewed and are negative.   Updated Vital Signs BP (!) 188/67   Pulse 65   Temp 97.7 F (36.5 C) (Oral)   Resp 14   Ht 1.6 m (5' 3)   Wt 53.6 kg   SpO2 98%   BMI 20.93 kg/m   Physical Exam Vitals and nursing note reviewed.  Constitutional:      General: She is not in acute distress.    Appearance: Normal appearance. She is well-developed. She is not toxic-appearing.  HENT:     Head: Normocephalic and atraumatic.   Eyes:     General: Lids are normal.     Conjunctiva/sclera: Conjunctivae normal.     Pupils: Pupils are equal, round, and reactive to light.   Neck:     Thyroid : No thyroid  mass.     Trachea: No tracheal deviation.   Cardiovascular:     Rate and Rhythm: Normal rate and regular rhythm.     Heart sounds: Normal heart sounds. No murmur heard.    No gallop.  Pulmonary:     Effort: Pulmonary effort is normal. No respiratory distress.     Breath sounds: Normal breath sounds. No stridor. No decreased breath sounds, wheezing, rhonchi or rales.  Abdominal:     General: There is no distension.     Palpations: Abdomen is soft.     Tenderness:  There is no abdominal tenderness. There is no rebound.   Musculoskeletal:        General: No tenderness. Normal range of motion.     Cervical back: Normal range of motion and neck supple.   Skin:    General: Skin is warm and dry.     Findings: No abrasion or rash.   Neurological:     Mental Status: She is alert and oriented to person, place, and time. Mental status is at baseline.     GCS: GCS eye subscore is 4. GCS verbal subscore is 5. GCS motor subscore is 6.     Cranial Nerves: No cranial nerve deficit.     Sensory: No sensory deficit.     Motor: Motor function is intact.   Psychiatric:        Attention and Perception: Attention normal.        Speech: Speech normal.        Behavior: Behavior normal.     (all labs ordered are listed, but only abnormal results are displayed) Labs Reviewed  LACTIC ACID, PLASMA - Abnormal; Notable for the following components:      Result Value   Lactic Acid, Venous 4.9 (*)    All other components within normal limits  COMPREHENSIVE METABOLIC PANEL WITH GFR - Abnormal; Notable for the following components:   Chloride 96 (*)    Glucose, Bld 240 (*)    BUN 24 (*)    Creatinine, Ser 1.44 (*)    Calcium  10.4 (*)    GFR, Estimated 34 (*)    Anion gap 17 (*)    All other components within normal limits  CBC WITH DIFFERENTIAL/PLATELET - Abnormal; Notable for the following components:   WBC 12.1 (*)    RBC 3.78 (*)    Hemoglobin 11.9 (*)    HCT 35.9 (*)    Neutro Abs 7.9 (*)    All other components within normal limits  CULTURE, BLOOD (SINGLE)  RESP PANEL BY RT-PCR (RSV, FLU A&B, COVID)  RVPGX2  CULTURE, BLOOD (ROUTINE X 2)  CULTURE, BLOOD (  ROUTINE X 2)  PROTIME-INR  URINALYSIS, W/ REFLEX TO CULTURE (INFECTION SUSPECTED)    EKG: None  Radiology: No results found.  {Document cardiac monitor, telemetry assessment procedure when appropriate:32947} Procedures   Medications Ordered in the ED  lactated ringers  infusion (has no  administration in time range)  lactated ringers  bolus 1,000 mL (has no administration in time range)    And  lactated ringers  bolus 500 mL (has no administration in time range)    And  lactated ringers  bolus 250 mL (has no administration in time range)  cefTRIAXone  (ROCEPHIN ) 2 g in sodium chloride  0.9 % 100 mL IVPB (has no administration in time range)      {Click here for ABCD2, HEART and other calculators REFRESH Note before signing:1}                              Medical Decision Making Amount and/or Complexity of Data Reviewed Labs: ordered. Radiology: ordered.  Risk Prescription drug management.   ***  {Document critical care time when appropriate  Document review of labs and clinical decision tools ie CHADS2VASC2, etc  Document your independent review of radiology images and any outside records  Document your discussion with family members, caretakers and with consultants  Document social determinants of health affecting pt's care  Document your decision making why or why not admission, treatments were needed:32947:::1}   Final diagnoses:  None    ED Discharge Orders     None

## 2023-10-12 NOTE — Assessment & Plan Note (Signed)
Stable at baseline Continue to monitor  

## 2023-10-12 NOTE — Sepsis Progress Note (Signed)
 Sepsis protocol monitored by eLink ?

## 2023-10-12 NOTE — Assessment & Plan Note (Signed)
 A1C of 10.5 last year Repeat A1c pending  Hold metformin   Continue levemir  and SSI and accuchecks QAC/HS

## 2023-10-12 NOTE — ED Provider Triage Note (Signed)
 Emergency Medicine Provider Triage Evaluation Note  [name] , a 89 y.o. female  was evaluated in triage.  Pt complains of generalized weakness and mild confusion . Home dipstick ua pos for infection and urine described as  pure Pus by son. Pt states  I jus t don't feel good. She just completed a course of Macrobid . PCP concerned for r/o sepsis.  Review of Systems  Positive: Generalized weakness Negative: Neruo deficit  Physical Exam  BP (!) 145/84 (BP Location: Right Arm)   Pulse 71   Temp 97.7 F (36.5 C) (Oral)   Resp 18   Ht 5' 3 (1.6 m)   Wt 53.6 kg   SpO2 97%   BMI 20.93 kg/m  Gen:   Awake, no distress   Resp:  Normal effort  MSK:   Moves extremities without difficulty  Other:    Medical Decision Making  Medically screening exam initiated at 3:12 PM.  Appropriate orders placed.  [name] was informed that the remainder of the evaluation will be completed by another provider, this initial triage assessment does not replace that evaluation, and the importance of remaining in the ED until their evaluation is complete.     Tama Fails, PA-C 10/12/23 1515

## 2023-10-12 NOTE — ED Triage Notes (Signed)
 Per pt son, pt had at home UTI test and it tested positive. PCP advised pt to come to ED for possible sepsis.

## 2023-10-12 NOTE — H&P (Signed)
 History and Physical    [name]: [name] BJY:[id number] DOB: July 19, 1929 DOA: 10/12/2023 DOS: the [name] was seen and examined on 10/12/2023 PCP: [name], No Pcp Per  [name] coming from: [place] - lives alone, but her sons take turns staying with her a week at a time so she is never alone. She uses a walker and a cane.    Chief Complaint: concern for UTI  HPI: [name] is a 89 y.o. female with medical history significant of T2DM, HTN, HLD,CKD stage 3b neuropathy, chronic back pain who presented to ED with complaints of   She has a CNA that comes in twice a week who came today. She noticed that she was more shaky than normal and did a home test for a UTI and it was positive and urine was milky in color. She has dysuria at start of stream, but has noticed urgency. No frequency. She didn't notice any color change or odor, but son reports it smells and looked like pus was in it. No fevers or chills. She has no CVA tenderness, has chronic back pain but no acute pain. No N/V/D. She has had decreased PO intake over the past few days.     Denies any fever/chills, vision changes/headaches, chest pain or palpitations, shortness of breath or cough, abdominal pain, N/V/D, or leg swelling.    She does not smoke or drink alcohol.   ER Course:  vitals: afebrile, bp: 145/84, HR: 71, RR: 18, oxygen: 99%RA Pertinent labs: wbc: 12.1, hgb: 11.9, BUN: 24, creatinine: 1.44, AG: 17, calcium : 10.4, lactic acid: 4.9  CXR: no acute finding  In ED: code sepsis activated. Received 30ml/kg IVF and rocephin . BC obtained. Continued on maintenance IVF. TRH asked to admit.    Review of Systems: As mentioned in the history of present illness. All other systems reviewed and are negative. Past Medical History:  Diagnosis Date   Arthritis    Chronic back pain    Cystoid macular edema of right eye 08/19/2019   Diabetic neuropathy (HCC)    Exudative age-related macular degeneration of left eye with inactive  choroidal neovascularization (HCC) 11/18/2019   Hypercholesteremia    Hypertension    Liver lesion    Neuropathy    Type II or unspecified type diabetes mellitus without mention of complication, not stated as uncontrolled    Vitamin D  deficiency    Wears glasses    Past Surgical History:  Procedure Laterality Date   ABDOMINAL HYSTERECTOMY     APPENDECTOMY     BACK SURGERY  1993   lumb lam-fusion   BREAST EXCISIONAL BIOPSY Right 1967   benign   CESAREAN SECTION     x 3    COLONOSCOPY     EYE SURGERY     both cataracts   TRIGGER FINGER RELEASE  01/10/2012   Procedure: RELEASE TRIGGER FINGER/A-1 PULLEY;  Surgeon: Alphonzo Ask, MD;  Location: [facility];  Service: Orthopedics;  Laterality: Right;  right ring trigger release   Social History:  reports that she has never smoked. She has never used smokeless tobacco. She reports that she does not drink alcohol and does not use drugs.  Allergies  Allergen Reactions   Ciprofloxacin  Other (See Comments)    Dizziness    Morphine And Codeine Other (See Comments)    fuzzy feeling    Family History  Problem Relation Age of Onset   Diabetes Sister    Liver disease Sister    Liver cancer  Sister    Lymphoma Sister    Stroke Brother    Diabetes Brother    Kidney cancer Brother    Colon cancer Neg Hx     Prior to Admission medications   Medication Sig Start Date End Date Taking? Authorizing Provider  aspirin  EC 81 MG tablet Take 81 mg by mouth daily. Swallow whole.    [provider]  bisoprolol  (ZEBETA ) 5 MG tablet Take 2.5 mg by mouth daily.    [provider]  bisoprolol -hydrochlorothiazide  (ZIAC ) 2.5-6.25 MG tablet Take  1 tablet  Daily  for BP                                /                                                                   TAKE                                         BY                                                 MOUTH 01/07/23   Vangie Genet, MD  Blood Glucose  Calibration (ACCU-CHEK AVIVA) SOLN Check blood sugar 3 times daily-DX-E11.22 03/11/15   Vangie Genet, MD  Blood Glucose Monitoring Suppl DEVI Use to test blood sugar three times a day 04/06/22   Cranford, Tonya, NP  Cholecalciferol (VITAMIN D3) 2000 UNITS TABS Take 1 capsule by mouth 2 (two) times daily. PRN    [provider]  COMFORT EZ PEN NEEDLES 32G X 4 MM MISC USE 2 TIMES DAILY AS DIRECTED , 1 PEN NEEDLE PER INJECTION. 01/14/19   Vangie Genet, MD  Continuous Glucose Sensor (FREESTYLE LIBRE SENSOR SYSTEM) MISC Check blood sugar 3 times a day. Dx:E11.8, Z79.4 11/14/22   Cranford, Tonya, NP  Cyanocobalamin (B-12) 2500 MCG TABS Take by mouth daily.    [provider]  docusate sodium  (COLACE) 100 MG capsule Take 1 capsule twice daily as needed for constipation with a bottle of water. 08/26/21   [place] Bureau, NP  dorzolamide-timolol (COSOPT) 22.3-6.8 MG/ML ophthalmic solution Place 1 drop into both eyes 2 (two) times daily. 03/31/16   [provider]  EASY COMFORT INSULIN  SYRINGE 30G X 1/2 0.5 ML MISC  10/01/15   [provider]  glucose blood (ONETOUCH VERIO) test strip USE 1 STRIP TO CHECK GLUCOSE THREE TIMES DAILY AS DIRECTED 05/23/22   Cranford, Tonya, NP  insulin  detemir (LEVEMIR ) 100 UNIT/ML injection Inject 0.1 mLs (10 Units total) into the skin daily. 04/20/23   Wilkinson, Dana E, FNP  insulin  lispro (HUMALOG ) 100 UNIT/ML injection Inject 5-10 Units into the skin 3 (three) times daily with meals. Per sliding scale 02/27/17   [provider]  Lancet Devices (ADJUSTABLE LANCING DEVICE) MISC CHECK BLOOD SUGAR 3 TIMES A DAY 09/07/20   Vangie Genet, MD  Lancets MISC Use to test blood sugar  three times a day 04/06/22   Cranford, Tonya, NP  latanoprost (XALATAN) 0.005 % ophthalmic solution Place 1 drop into both eyes at bedtime. 03/28/16   [provider]  linaclotide  (LINZESS ) 72 MCG capsule Start taking 1 cap daily for constipation.  10/19/21   [place] Bureau, NP  meclizine  (ANTIVERT ) 25 MG tablet Take 0.5-1 tablets (12.5-25 mg total) by mouth 3 (three) times daily as needed for dizziness. 12/10/21   Butler, Michael C, MD  Melatonin 10 MG TABS Take by mouth at bedtime.    [provider]  metFORMIN  (GLUCOPHAGE -XR) 500 MG 24 hr tablet Take 1 tablet 1 x /day with Meals for Diabetes. 11/14/22   Langley Pippin, NP  Multiple Vitamins-Minerals (CENTRUM SILVER 50+WOMEN) TABS Take 1 tablet by mouth daily.    [provider]  Multiple Vitamins-Minerals (HAIR SKIN AND NAILS FORMULA PO) Take by mouth daily.    [provider]  rosuvastatin  (CRESTOR ) 20 MG tablet Take   1 tablet  Daily for Cholesterol                                                                 /                                                          TAKE                                              BY                                        MOUTH                                      ONCE ? DAILY [name] taking differently: Take 20 mg by mouth daily. 08/26/22   Vangie Genet, MD  traMADol  (ULTRAM ) 50 MG tablet Take 50 mg by mouth every 6 (six) hours as needed for moderate pain. Pt only takes 2 pills per night    [provider]  triamcinolone  (KENALOG ) 0.025 % ointment Apply 1 Application topically 2 (two) times daily. 11/14/22   Cranford, Tonya, NP  Zinc 50 MG TABS Take by mouth as needed.    [provider]    Physical Exam: Vitals:   10/12/23 1845 10/12/23 1930 10/12/23 2010 10/12/23 2101  BP: (!) 153/75 (!) 133/103 (!) 132/48 (!) 184/67  Pulse: 72 70 70 66  Resp: 18 15 (!) 22 20  Temp:    98.1 F (36.7 C)  TempSrc:    Oral  SpO2: 100% 97% 95% 99%  Weight:      Height:       General:  Appears calm and comfortable and is in NAD Eyes:  PERRL, EOMI, normal lids, iris ENT: HOH, lips &  tongue, mmm; appropriate dentition Neck:  no LAD, masses or thyromegaly; no carotid bruits Cardiovascular:  RRR, no m/r/g. No LE  edema.  Respiratory:   CTA bilaterally with no wheezes/rales/rhonchi.  Normal respiratory effort. Abdomen:  soft, NT, ND, NABS Back:   normal alignment, no CVAT. TTP over left SI joint area  Skin:  no rash or induration seen on limited exam Musculoskeletal:  grossly normal tone BUE/BLE, good ROM, no bony abnormality Lower extremity:  No LE edema.  Limited foot exam with no ulcerations.  2+ distal pulses. Psychiatric:  grossly normal mood and affect, speech fluent and appropriate, AOx3 Neurologic:  CN 2-12 grossly intact, moves all extremities in coordinated fashion, sensation intact   Radiological Exams on Admission: Independently reviewed - see discussion in A/P where applicable  DG Chest Port 1 View Result Date: 10/12/2023 CLINICAL DATA:  Questionable sepsis - evaluate for abnormality EXAM: PORTABLE CHEST - 1 VIEW COMPARISON:  11/02/2022 FINDINGS: Lungs are clear. Heart size and mediastinal contours are within normal limits. Aortic Atherosclerosis (ICD10-170.0). No effusion. Vertebral endplate spurring at multiple levels in the mid and lower thoracic spine. IMPRESSION: No acute cardiopulmonary disease. Electronically Signed   By: Nicoletta Barrier M.D.   On: 10/12/2023 17:22    EKG: Independently reviewed.  NSR with rate 64; nonspecific ST changes with no evidence of acute ischemia   Labs on Admission: I have personally reviewed the available labs and imaging studies at the time of the admission.  Pertinent labs:   wbc: 12.1,  hgb: 11.9,  BUN: 24,  creatinine: 1.44,  AG: 17,  calcium : 10.4,  lactic acid: 4.9   Assessment and Plan: Principal Problem:   Sepsis secondary to UTI [facility]) Active Problems:   Hypercalcemia   Type 2 diabetes mellitus with complication, with long-term current use of insulin  (HCC)   CKD stage 3b, GFR 30-44 ml/min (HCC)   Hyperlipidemia associated with type 2 diabetes mellitus (HCC)   Hypertension   Lactic acidosis    Assessment and Plan: * Sepsis secondary  to UTI [facility]) 89 year old presenting to ED with dysuria, shaking and foul smelling urine found to have a white count of 12.1 and lactic acid of 4.9 with urinalysis significant for UTI  -admit to tele  -BC and urine cx pending -continue rocephin  -received 30ml/kg of IVF and continued on maintenance IVF -repeat lactic acid pending, continue to trend  -f/u on culture  Hypercalcemia Repeat in AM, if still elevated check PTH  CKD stage 3b, GFR 30-44 ml/min (HCC) Stable at baseline Continue to monitor   Type 2 diabetes mellitus with complication, with long-term current use of insulin  (HCC) A1C of 10.5 last year Repeat A1c pending  Hold metformin   Continue levemir  and SSI and accuchecks QAC/HS   Hyperlipidemia associated with type 2 diabetes mellitus (HCC) Continue crestor    Hypertension Continue bisoprolol  daily  IV prn metoprolol with parameters      Advance Care Planning:   Code Status: Full Code   Consults: none   DVT Prophylaxis: lovenox    Family Communication: son at bedside   Severity of Illness: The appropriate [name] status for this [name] is INPATIENT. Inpatient status is judged to be reasonable and necessary in order to provide the required intensity of service to ensure the [name]'s safety. The [name]'s presenting symptoms, physical exam findings, and initial radiographic and laboratory data in the context of their chronic comorbidities is felt to place them at high risk for further clinical deterioration. Furthermore, it is not  anticipated that the [name] will be medically stable for discharge from the hospital within 2 midnights of admission.   * I certify that at the point of admission it is my clinical judgment that the [name] will require inpatient hospital care spanning beyond 2 midnights from the point of admission due to high intensity of service, high risk for further deterioration and high frequency of surveillance required.*  Author: Raymona Caldwell,  MD 10/12/2023 11:02 PM  For on call review [email or web address].

## 2023-10-12 NOTE — Assessment & Plan Note (Signed)
 Continue crestor

## 2023-10-12 NOTE — ED Notes (Signed)
 Pt reports stomach 'feels tight'. PVR performed, 137 mL in bladder. EDP notified.

## 2023-10-12 NOTE — Assessment & Plan Note (Addendum)
 Continue bisoprolol  daily  IV prn metoprolol with parameters

## 2023-10-13 DIAGNOSIS — N390 Urinary tract infection, site not specified: Secondary | ICD-10-CM | POA: Diagnosis not present

## 2023-10-13 DIAGNOSIS — A419 Sepsis, unspecified organism: Secondary | ICD-10-CM | POA: Diagnosis not present

## 2023-10-13 LAB — GLUCOSE, CAPILLARY
Glucose-Capillary: 150 mg/dL — ABNORMAL HIGH (ref 70–99)
Glucose-Capillary: 225 mg/dL — ABNORMAL HIGH (ref 70–99)
Glucose-Capillary: 177 mg/dL — ABNORMAL HIGH (ref 70–99)
Glucose-Capillary: 190 mg/dL — ABNORMAL HIGH (ref 70–99)

## 2023-10-13 LAB — COMPREHENSIVE METABOLIC PANEL WITH GFR
ALT: 10 U/L (ref 0–44)
ALT: 14 U/L (ref 0–44)
AST: 15 U/L (ref 15–41)
AST: 20 U/L (ref 15–41)
Albumin: 1.9 g/dL — ABNORMAL LOW (ref 3.5–5.0)
Albumin: 2.7 g/dL — ABNORMAL LOW (ref 3.5–5.0)
Alkaline Phosphatase: 34 U/L — ABNORMAL LOW (ref 38–126)
Alkaline Phosphatase: 43 U/L (ref 38–126)
Anion gap: 14 (ref 5–15)
Anion gap: 11 (ref 5–15)
BUN: 18 mg/dL (ref 8–23)
BUN: 22 mg/dL (ref 8–23)
CO2: 21 mmol/L — ABNORMAL LOW (ref 22–32)
CO2: 27 mmol/L (ref 22–32)
Calcium: 8 mg/dL — ABNORMAL LOW (ref 8.9–10.3)
Calcium: 8.8 mg/dL — ABNORMAL LOW (ref 8.9–10.3)
Chloride: 103 mmol/L (ref 98–111)
Chloride: 103 mmol/L (ref 98–111)
Creatinine, Ser: 0.85 mg/dL (ref 0.44–1.00)
Creatinine, Ser: 1.18 mg/dL — ABNORMAL HIGH (ref 0.44–1.00)
GFR, Estimated: >60 mL/min (ref >60)
GFR, Estimated: 44 mL/min — ABNORMAL LOW (ref >60)
Glucose, Bld: 161 mg/dL — ABNORMAL HIGH (ref 70–99)
Glucose, Bld: 182 mg/dL — ABNORMAL HIGH (ref 70–99)
Potassium: 3.7 mmol/L (ref 3.5–5.1)
Potassium: 3.4 mmol/L — ABNORMAL LOW (ref 3.5–5.1)
Sodium: 138 mmol/L (ref 135–145)
Sodium: 141 mmol/L (ref 135–145)
Total Bilirubin: 0.4 mg/dL (ref 0.0–1.2)
Total Bilirubin: 0.6 mg/dL (ref 0.0–1.2)
Total Protein: 4.3 g/dL — ABNORMAL LOW (ref 6.5–8.1)
Total Protein: 5.9 g/dL — ABNORMAL LOW (ref 6.5–8.1)

## 2023-10-13 LAB — CBC
HCT: 22.6 % — ABNORMAL LOW (ref 36.0–46.0)
HCT: 32.8 % — ABNORMAL LOW (ref 36.0–46.0)
Hemoglobin: 7.1 g/dL — ABNORMAL LOW (ref 12.0–15.0)
Hemoglobin: 10.7 g/dL — ABNORMAL LOW (ref 12.0–15.0)
MCH: 32 pg (ref 26.0–34.0)
MCH: 31.3 pg (ref 26.0–34.0)
MCHC: 31.4 g/dL (ref 30.0–36.0)
MCHC: 32.6 g/dL (ref 30.0–36.0)
MCV: 101.8 fL — ABNORMAL HIGH (ref 80.0–100.0)
MCV: 95.9 fL (ref 80.0–100.0)
Platelets: 179 10*3/uL (ref 150–400)
Platelets: 264 10*3/uL (ref 150–400)
RBC: 2.22 MIL/uL — ABNORMAL LOW (ref 3.87–5.11)
RBC: 3.42 MIL/uL — ABNORMAL LOW (ref 3.87–5.11)
RDW: 13.3 % (ref 11.5–15.5)
RDW: 13.3 % (ref 11.5–15.5)
WBC: 8.5 10*3/uL (ref 4.0–10.5)
WBC: 12.1 10*3/uL — ABNORMAL HIGH (ref 4.0–10.5)
nRBC: 0 % (ref 0.0–0.2)
nRBC: 0 % (ref 0.0–0.2)

## 2023-10-13 LAB — HEMOGLOBIN A1C
Hgb A1c MFr Bld: 8.2 % — ABNORMAL HIGH (ref 4.8–5.6)
Mean Plasma Glucose: 188.64 mg/dL

## 2023-10-13 LAB — LACTIC ACID, PLASMA: Lactic Acid, Venous: 1.3 mmol/L (ref 0.5–1.9)

## 2023-10-13 MED ORDER — BISOPROLOL FUMARATE 5 MG PO TABS
2.5000 mg | ORAL_TABLET | Freq: Once | ORAL | Status: AC
  Administered 2023-10-13: 2.5 mg via ORAL

## 2023-10-13 MED ORDER — INSULIN ASPART 100 UNIT/ML IJ SOLN: 0.0000 [IU] | Freq: Every day | INTRAMUSCULAR | Status: DC

## 2023-10-13 MED ORDER — POTASSIUM CHLORIDE CRYS ER 20 MEQ PO TBCR
40.0000 meq | EXTENDED_RELEASE_TABLET | Freq: Once | ORAL | Status: AC
  Administered 2023-10-13: 40 meq via ORAL
  Filled 2023-10-13: qty 2

## 2023-10-13 MED ORDER — INSULIN ASPART 100 UNIT/ML IJ SOLN
0.0000 [IU] | Freq: Three times a day (TID) | INTRAMUSCULAR | Status: DC
  Administered 2023-10-13: 5 [IU] via SUBCUTANEOUS
  Administered 2023-10-14: 2 [IU] via SUBCUTANEOUS

## 2023-10-13 MED ORDER — BISOPROLOL FUMARATE 5 MG PO TABS
5.0000 mg | ORAL_TABLET | Freq: Every day | ORAL | Status: DC
  Administered 2023-10-14: 5 mg via ORAL
  Filled 2023-10-13: qty 1

## 2023-10-13 NOTE — Inpatient Diabetes Management (Signed)
 Inpatient Diabetes Program Recommendations  AACE/ADA: New Consensus Statement on Inpatient Glycemic Control (2015)  Target Ranges:  Prepandial:   less than 140 mg/dL      Peak postprandial:   less than 180 mg/dL (1-2 hours)      Critically ill patients:  140 - 180 mg/dL   Lab Results  Component Value Date   GLUCAP 177 (H) 10/13/2023   HGBA1C 8.2 (H) 10/12/2023    Review of Glycemic Control  Diabetes history: DM2 Outpatient Diabetes medications: Basaglar  15 at bedtime, Humalog  5-10 TID per s/s, metformin  1000 mg BID Current orders for Inpatient glycemic control: Semglee  15 at bedtime, Novolog  0-9 TID with meals and 0-5 HS  HgbA1C - 8.2% CBGs today: 150, 225, 177  Inpatient Diabetes Program Recommendations:    Consider adding Novolog  2-3 units TID for meal coverage if post-prandials > 180 mg/dL  Follow.  Thank you. Joni Net, RD, LDN, CDCES Inpatient Diabetes Coordinator [phone]

## 2023-10-13 NOTE — Progress Notes (Signed)
 [name]  WUJ:[id number] DOB: October 02, 1929 DOA: 10/12/2023 PCP: Patient, No Pcp Per    Brief Narrative:  89 year old with a history of DM2, HTN, HLD, CKD stage IIIb, diabetic neuropathy, and chronic back pain who presented to the ER 6/19 with complaints of generalized weakness and shakiness accompanied by dysuria and cloudy/milky urine discoloration.  In the ER her WBC was elevated at 12.1.  She was not tachycardic nor tachypneic and her blood pressure was stable.  Goals of Care:   Code Status: Full Code   DVT prophylaxis: enoxaparin  (LOVENOX ) injection 30 mg Start: 10/13/23 1000   Interim Hx: Afebrile since admission.  Blood pressure presently elevated at 175-184.  Potassium mildly low at 3.4.  Alert oriented and pleasant at the time of my exam.  Tells me she is highly motivated to get home.  Reports a poor appetite.  Has no other complaints.  Denies chest pain or shortness of breath.  Assessment & Plan:  Acute UTI POA - sepsis ruled out Was documented as having sepsis at presentation but vitals and overall clinical situation did not support this diagnosis - she did have a clear UTI POA - continue empiric antibiotic while culture is pending - clinically appears to be stabilizing rapidly -encouraged early mobilization with help from family  Lactic acidosis Appears to have been due to to simple volume depletion -corrected with volume resuscitation  Hypercalcemia Likely simply due to volume depletion in setting of poor intake over the last few days related to her active UTI -continue to monitor trend with hydration - appears to have resolved   CKD stage IIIb Renal function stable at present  Mild hypokalemia Due to poor intake - supplement and recheck in a.m.  Normocytic anemia Likely due to poor nutritional status in general as well as CKD -no evidence of gross blood loss -monitor trend  DM2 Requires Levemir  and metformin  at home -A1c 8.2 -given advanced age there is no  indication for strict CBG control -monitor CBGs and adjust treatment as needed to avoid extremes in either direction  HLD Continue usual Crestor  dose  HTN usual home bisoprolol  dose increased due to systolic blood pressures up to 184 -no indication for strict blood pressure control given her advanced age -continue to monitor for now without adding a second agent    Family Communication: Spoke with son at bedside Disposition: Patient was living independently prior to this admission -her son intends to move in with her temporarily after discharge to ensure she has the assistance she needs while she recovers -if she has a good night and continues to improve at the rate that she has thus far she will be a potential candidate for discharge home 6/21 depending upon her functional status when she works with PT   Objective: Blood pressure (!) 175/56, pulse 60, temperature (!) 97.4 F (36.3 C), temperature source Oral, resp. rate 19, height 5' 3 (1.6 m), weight 53.6 kg, SpO2 96%.  Intake/Output Summary (Last 24 hours) at 10/13/2023 0940 Last data filed at 10/13/2023 0830 Gross per 24 hour  Intake 3850.68 ml  Output 900 ml  Net 2950.68 ml   Filed Weights   10/12/23 1510  Weight: 53.6 kg    Examination: General: No acute respiratory distress Lungs: Clear to auscultation bilaterally without wheezes or crackles Cardiovascular: Regular rate and rhythm without murmur gallop or rub normal S1 and S2 Abdomen: Nontender, nondistended, soft, bowel sounds positive, no rebound, no ascites, no appreciable mass Extremities: No significant cyanosis, clubbing,  or edema bilateral lower extremities  CBC: Recent Labs  Lab 10/12/23 1516 10/13/23 0350 10/13/23 0634  WBC 12.1* 8.5 12.1*  NEUTROABS 7.9*  --   --   HGB 11.9* 7.1* 10.7*  HCT 35.9* 22.6* 32.8*  MCV 95.0 101.8* 95.9  PLT 329 179 264   Basic Metabolic Panel: Recent Labs  Lab 10/12/23 1516 10/13/23 0350 10/13/23 0634  NA 139 138 141   K 4.8 3.7 3.4*  CL 96* 103 103  CO2 26 21* 27  GLUCOSE 240* 161* 182*  BUN 24* 18 22  CREATININE 1.44* 0.85 1.18*  CALCIUM  10.4* 8.0* 8.8*   GFR: Estimated Creatinine Clearance: 25.7 mL/min (A) (by C-G formula based on SCr of 1.18 mg/dL (H)).   Scheduled Meds:  aspirin  EC  81 mg Oral Daily   bisoprolol   2.5 mg Oral Daily   enoxaparin  (LOVENOX ) injection  30 mg Subcutaneous Q24H   insulin  aspart  0-9 Units Subcutaneous TID WC   insulin  glargine-yfgn  15 Units Subcutaneous QHS   lidocaine   1 patch Transdermal Q24H   melatonin  5 mg Oral QHS   rosuvastatin   20 mg Oral Daily   Continuous Infusions:  cefTRIAXone  (ROCEPHIN )  IV     lactated ringers  150 mL/hr at 10/13/23 0227     LOS: 1 day   Abbe Abate, MD [facility] Office  [phone] Pager - Text Page per Tilford Foley  If 7PM-7AM, please contact night-coverage per Amion 10/13/2023, 9:40 AM

## 2023-10-13 NOTE — Plan of Care (Signed)

## 2023-10-13 NOTE — TOC Initial Note (Signed)
 Transition of Care [facility]) - Initial/Assessment Note    Patient Details  Name: [name] MRN: [id number] Date of Birth: 1929-11-06  [facility] CM/SW Contact:    Kelsey Ream, RN Phone Number: 10/13/2023, 3:04 PM  Clinical Narrative:                 [name] w/ pt and son [name] [phone]) in room; he says pt lives at home and family plans for her to return at d/c; family will provide transportation; Mr Whisner verified insurance/PCP; he denied pt experiencing SDOH risks; pt has cane, walker, and shower chair; her son says she does not receive HH services or home oxygen; awaiting PT/OT evals; TOC is following.  Expected Discharge Plan: Home/Self Care Barriers to Discharge: Continued Medical Work up   Patient Goals and CMS Choice Patient states their goals for this hospitalization and ongoing recovery are:: pt's son [name] says she will return home CMS Medicare.gov Compare Post Acute Care list provided to:: Patient Represenative (must comment) [name] (son) [phone])   [place] ownership interest in [facility].provided to:: Adult Children    Expected Discharge Plan and Services   Discharge Planning Services: CM Consult                                          Prior Living Arrangements/Services   Lives with:: Self Patient language and need for interpreter reviewed:: Yes Do you feel safe going back to the place where you live?: Yes      Need for Family Participation in Patient Care: Yes (Comment) Care giver support system in place?: Yes (comment) Current home services: DME (cane, walker, shower chair) Criminal Activity/Legal Involvement Pertinent to Current Situation/Hospitalization: No - Comment as needed  Activities of Daily Living   ADL Screening (condition at time of admission) Independently performs ADLs?: Yes (appropriate for developmental age) Is the patient deaf or have difficulty hearing?: No Does  the patient have difficulty seeing, even when wearing glasses/contacts?: Yes Does the patient have difficulty concentrating, remembering, or making decisions?: Yes  Permission Sought/Granted Permission sought to share information with : Case Manager Permission granted to share information with : Yes, Verbal Permission Granted  Share Information with NAME: Case Manager     Permission granted to share info w Relationship: [name] (son) [phone]     Emotional Assessment Appearance:: Appears stated age Attitude/Demeanor/Rapport: Gracious Affect (typically observed): Accepting Orientation: : Oriented to Self Alcohol / Substance Use: Not Applicable Psych Involvement: No (comment)  Admission diagnosis:  Severe sepsis (HCC) [A41.9, R65.20] Patient Active Problem List   Diagnosis Date Noted   CKD stage 3b, GFR 30-44 ml/min (HCC) 10/12/2023   Hypercalcemia 10/12/2023   Sepsis secondary to UTI (HCC) 10/12/2023   Lactic acidosis 10/12/2023   UTI (urinary tract infection) 11/02/2022   Acute kidney injury superimposed on chronic kidney disease (HCC) 11/02/2022   Frequent UTI 02/10/2020   Advanced nonexudative age-related macular degeneration of left eye with subfoveal involvement 01/08/2020   Memory changes 12/31/2019   Poorly controlled type 2 diabetes mellitus (HCC) 12/31/2019   Poor compliance with medication 12/31/2019   Advanced nonexudative age-related macular degeneration of right eye with subfoveal involvement 11/18/2019   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) 11/18/2019   Exudative age-related macular degeneration of right eye with active choroidal neovascularization (HCC)  08/19/2019   Exudative age-related macular degeneration of left eye with active choroidal neovascularization (HCC) 08/19/2019   Long term (current) use of insulin  (HCC) 06/10/2019   Major depression, recurrent (HCC) 12/20/2018   Type 2  diabetes mellitus with complication, with long-term current use of insulin  (HCC) 02/09/2017   Atherosclerosis of aorta (HCC) by AbdCTscan on 06/28/2017 11/06/2016   Encounter for Medicare annual wellness exam 05/18/2015   Medication management 08/06/2013   CKD stage 3 due to type 2 diabetes mellitus (HCC)    Hyperlipidemia associated with type 2 diabetes mellitus (HCC)    Vitamin D  deficiency    Hypertension 07/29/2012   Chronic pain 07/29/2012   PCP:  Stephane Ee, FNP Pharmacy:   [facility] [id number] ([address]),  - [address] [place] ([place]) [place] [address] Phone: [phone] Fax: [phone]  [facility] - [place], [place] - [address] [place] [address] Phone: [phone] Fax: [phone]  [facility] [place], [place] - [address] [place] [address] Phone: [phone] Fax: [phone]  [facility] - [place], [place] - [address] [place] [address] Phone: [phone] Fax: [phone]     Social Drivers of Health (SDOH) Social History: SDOH Screenings   Food Insecurity: No Food Insecurity (10/13/2023)  Housing: Low Risk  (10/13/2023)  Transportation Needs: No Transportation Needs (10/13/2023)  Utilities: Not At Risk (10/13/2023)  Depression (PHQ2-9): Low Risk  (09/22/2022)  Social Connections: Moderately Integrated (10/12/2023)  Tobacco Use: Low Risk  (10/12/2023)   SDOH Interventions: Food Insecurity Interventions: Intervention Not Indicated, Inpatient TOC Housing Interventions: Intervention Not Indicated, Inpatient TOC Transportation Interventions: Intervention Not Indicated, Inpatient TOC Utilities Interventions: Intervention Not Indicated, Inpatient TOC   Readmission Risk Interventions    11/03/2022    1:11 PM  Readmission Risk  Prevention Plan  Post Dischage Appt Complete  Medication Screening Complete  Transportation Screening Complete

## 2023-10-13 NOTE — Progress Notes (Signed)
 Mobility Specialist - Progress Note   10/13/23 1351  Mobility  Activity Ambulated with assistance in hallway  Level of Assistance Contact guard assist, steadying assist  Assistive Device Cane  Distance Ambulated (ft) 200 ft  Range of Motion/Exercises Active  Activity Response Tolerated well  Mobility Referral Yes  Mobility visit 1 Mobility  Mobility Specialist Start Time (ACUTE ONLY) 1340  Mobility Specialist Stop Time (ACUTE ONLY) 1351  Mobility Specialist Time Calculation (min) (ACUTE ONLY) 11 min   Pt was found on recliner chair requesting assistance to bed. Agreeable to ambulate prior to returning to bed. Pt c/o BLE weakness. At EOS returned to bed with all needs met. Call bell in reach and family in room.  Lorna Rose,  Mobility Specialist Can be reached via Secure Chat

## 2023-10-14 DIAGNOSIS — E872 Acidosis, unspecified: Secondary | ICD-10-CM

## 2023-10-14 DIAGNOSIS — E118 Type 2 diabetes mellitus with unspecified complications: Secondary | ICD-10-CM

## 2023-10-14 DIAGNOSIS — Z794 Long term (current) use of insulin: Secondary | ICD-10-CM

## 2023-10-14 DIAGNOSIS — N1832 Chronic kidney disease, stage 3b: Secondary | ICD-10-CM

## 2023-10-14 DIAGNOSIS — N3000 Acute cystitis without hematuria: Secondary | ICD-10-CM | POA: Diagnosis not present

## 2023-10-14 LAB — CBC
HCT: 34.2 % — ABNORMAL LOW (ref 36.0–46.0)
Hemoglobin: 11.1 g/dL — ABNORMAL LOW (ref 12.0–15.0)
MCH: 31.6 pg (ref 26.0–34.0)
MCHC: 32.5 g/dL (ref 30.0–36.0)
MCV: 97.4 fL (ref 80.0–100.0)
Platelets: 281 10*3/uL (ref 150–400)
RBC: 3.51 MIL/uL — ABNORMAL LOW (ref 3.87–5.11)
RDW: 13.3 % (ref 11.5–15.5)
WBC: 12.8 10*3/uL — ABNORMAL HIGH (ref 4.0–10.5)
nRBC: 0 % (ref 0.0–0.2)

## 2023-10-14 LAB — MAGNESIUM: Magnesium: 1.7 mg/dL (ref 1.7–2.4)

## 2023-10-14 LAB — BASIC METABOLIC PANEL WITH GFR
Anion gap: 13 (ref 5–15)
BUN: 21 mg/dL (ref 8–23)
CO2: 26 mmol/L (ref 22–32)
Calcium: 9.3 mg/dL (ref 8.9–10.3)
Chloride: 104 mmol/L (ref 98–111)
Creatinine, Ser: 1.32 mg/dL — ABNORMAL HIGH (ref 0.44–1.00)
GFR, Estimated: 38 mL/min — ABNORMAL LOW (ref >60)
Glucose, Bld: 153 mg/dL — ABNORMAL HIGH (ref 70–99)
Potassium: 3.4 mmol/L — ABNORMAL LOW (ref 3.5–5.1)
Sodium: 143 mmol/L (ref 135–145)

## 2023-10-14 LAB — GLUCOSE, CAPILLARY
Glucose-Capillary: 152 mg/dL — ABNORMAL HIGH (ref 70–99)
Glucose-Capillary: 162 mg/dL — ABNORMAL HIGH (ref 70–99)

## 2023-10-14 MED ORDER — CEFIXIME 400 MG PO CAPS: 400.0000 mg | ORAL_CAPSULE | Freq: Every day | ORAL | 0 refills | Status: DC

## 2023-10-14 MED ORDER — POTASSIUM CHLORIDE CRYS ER 20 MEQ PO TBCR
40.0000 meq | EXTENDED_RELEASE_TABLET | Freq: Once | ORAL | Status: AC
  Administered 2023-10-14: 40 meq via ORAL
  Filled 2023-10-14: qty 2

## 2023-10-14 NOTE — Evaluation (Signed)
 Occupational Therapy Evaluation Patient Details Name: [name] MRN: [id number] DOB: 03-03-1930 Today's Date: 10/14/2023   History of Present Illness   Patient is a 89 year old female who presented with shakiness, and weakness. Patient was admitted with UTI, lactic acidosis, and hypercalcemia. PMH: hypokalemia, CKD III, DM II, HTN, HLD.     Clinical Impressions Patient is a 89 year old female who was admitted for above. Patient was living at home with sons support who switch off weeks to stay with patient. Patient reported walking around at home with personal cane and furniture walking. Patient was noted to have decreased functional activity tolerance, decreased endurance, decreased standing balance, decreased safety awareness, and decreased knowledge of AD/AE impacting participation in ADLs. Patient would continue to benefit from skilled OT services at this time while admitted and after d/c to address noted deficits in order to improve overall safety and independence in ADLs. Plan is for patient to d/c home with [facility] services and continued family support.       If plan is discharge home, recommend the following:   A little help with walking and/or transfers;A little help with bathing/dressing/bathroom;Assistance with cooking/housework;Direct supervision/assist for medications management;Assist for transportation;Help with stairs or ramp for entrance;Direct supervision/assist for financial management;Supervision due to cognitive status     Functional Status Assessment   Patient has had a recent decline in their functional status and demonstrates the ability to make significant improvements in function in a reasonable and predictable amount of time.     Equipment Recommendations   None recommended by OT      Precautions/Restrictions   Precautions Precautions: Fall Restrictions Weight Bearing Restrictions Per Provider Order: No     Mobility Bed Mobility Overal bed  mobility: Needs Assistance Bed Mobility: Supine to Sit     Supine to sit: Supervision     General bed mobility comments: with increased time.           Balance Overall balance assessment: Mild deficits observed, not formally tested           ADL either performed or assessed with clinical judgement   ADL Overall ADL's : Needs assistance/impaired Eating/Feeding: Supervision/ safety;Sitting   Grooming: Oral care;Wash/dry face;Standing;Supervision/safety   Upper Body Bathing: Sitting;Supervision/ safety   Lower Body Bathing: Sitting/lateral leans;Supervison/ safety Lower Body Bathing Details (indicate cue type and reason): patient was able to reach feet with figure four positioning simulated task. Upper Body Dressing : Sitting;Supervision/safety   Lower Body Dressing: Sitting/lateral leans;Supervision/safety   Toilet Transfer: Supervision/safety;Set up;Ambulation Toilet Transfer Details (indicate cue type and reason): with personal cane. Toileting- Clothing Manipulation and Hygiene: Supervision/safety;Sitting/lateral lean               Vision   Vision Assessment?: No apparent visual deficits            Pertinent Vitals/Pain Pain Assessment Pain Assessment: No/denies pain     Extremity/Trunk Assessment Upper Extremity Assessment Upper Extremity Assessment: Overall WFL for tasks assessed   Lower Extremity Assessment Lower Extremity Assessment: Defer to PT evaluation   Cervical / Trunk Assessment Cervical / Trunk Assessment: Normal   Communication     Cognition Arousal: Alert Behavior During Therapy: WFL for tasks assessed/performed Cognition: Cognition impaired   Orientation impairments: Time Awareness: Intellectual awareness impaired, Online awareness impaired Memory impairment (select all impairments): Non-declarative long-term memory, Declarative long-term memory, Working Biochemist, clinical functioning impairment (select all impairments):  Sequencing, Reasoning, Problem solving OT - Cognition Comments: patient reported today was  saturday, ill have to check my calendar when asked month and date. patients son manage medications for her.                 Following commands: Intact                  Home Living Family/patient expects to be discharged to:: Private residence Living Arrangements: Children Available Help at Discharge: Family;Available 24 hours/day Type of Home: House Home Access: Stairs to enter [employer] of Steps: 2 Entrance Stairs-Rails: Right Home Layout: One level     Bathroom Shower/Tub: Walk-in shower         Home Equipment: Agricultural consultant (2 wheels);Wheelchair - manual;Cane - single point          Prior Functioning/Environment Prior Level of Function : Independent/Modified Independent             Mobility Comments: walks with cane ADLs Comments: patient has sons stay with her they rotate each week.    OT Problem List: Impaired balance (sitting and/or standing);Decreased safety awareness;Decreased knowledge of precautions;Decreased knowledge of use of DME or AE   OT Treatment/Interventions: Self-care/ADL training;DME and/or AE instruction;Therapeutic activities;Balance training;Energy conservation;Patient/family education      OT Goals(Current goals can be found in the care plan section)   Acute Rehab OT Goals Patient Stated Goal: to get back home OT Goal Formulation: With patient Time For Goal Achievement: 10/28/23 Potential to Achieve Goals: Fair   OT Frequency:  Min 1X/week       AM-PAC OT 6 Clicks Daily Activity     Outcome Measure Help from another person eating meals?: None Help from another person taking care of personal grooming?: A Little Help from another person toileting, which includes using toliet, bedpan, or urinal?: A Little Help from another person bathing (including washing, rinsing, drying)?: A Little Help from another person to  put on and taking off regular upper body clothing?: None Help from another person to put on and taking off regular lower body clothing?: A Little 6 Click Score: 20   End of Session Equipment Utilized During Treatment: Gait belt;Other (comment) (personal cane) Nurse Communication: Mobility status  Activity Tolerance: Patient tolerated treatment well Patient left: in chair;with call bell/phone within reach;with chair alarm set  OT Visit Diagnosis: Unsteadiness on feet (R26.81);Other abnormalities of gait and mobility (R26.89)                Time: 9078-9061 OT Time Calculation (min): 17 min Charges:  OT General Charges $OT Visit: 1 Visit OT Evaluation $OT Eval Low Complexity: 1 Low  Loyd Marhefka OTR/L, [place] Acute Rehabilitation Department Office# [phone]   Geofm CHRISTELLA Dance 10/14/2023, 9:45 AM

## 2023-10-14 NOTE — Evaluation (Signed)
 Physical Therapy One Time Evaluation Patient Details Name: [name] MRN: [id number] DOB: [birth year] Today's Date: 10/14/2023  History of Present Illness  Patient is a 89 year old female who presented with shakiness, and weakness. Patient was admitted with UTI, lactic acidosis, and hypercalcemia. PMH: hypokalemia, CKD III, DM II, HTN, HLD.  Clinical Impression  Patient evaluated by Physical Therapy with no further acute PT needs identified. All education has been completed and the patient has no further questions.  Pt very eager to d/c home today.  Pt assisted with ambulating 200 ft utilizing her cane and occasionally needed min assist.  Pt's son present and notified of unsteadiness; he states she will have 24/7 supervision upon d/c and agreeable to recommended initial CGA during mobility with gait belt (provided today) for safety.  Pt and son decline need for HHPT. PT is signing off. Thank you for this referral.         If plan is discharge home, recommend the following:  Initial 24/7 supervision of mobility   Can travel by private vehicle        Equipment Recommendations None recommended by PT  Recommendations for Other Services       Functional Status Assessment Patient has had a recent decline in their functional status and demonstrates the ability to make significant improvements in function in a reasonable and predictable amount of time.     Precautions / Restrictions Precautions Precautions: Fall      Mobility  Bed Mobility               General bed mobility comments: pt in recliner    Transfers Overall transfer level: Needs assistance Equipment used: None Transfers: Sit to/from Stand Sit to Stand: Supervision           General transfer comment: supervision for safety    Ambulation/Gait Ambulation/Gait assistance: Contact guard assist, Min assist Gait Distance (Feet): 200 Feet Assistive device: Rolling walker (2 wheels) Gait Pattern/deviations:  Step-through pattern, Decreased stride length, Narrow base of support Gait velocity: decr     General Gait Details: pt mostly CGA for safety; did require light min assist for LOB, another LOB however pt able to self correct; pt reports a little wobbly from not ambulating during admission and states she uses rollator at home and/or furniture walks with cane - son notified of mild unsteadiness and provided gait belt for home, he was agreeable to initially have CGA for pt upon d/c for safety  Stairs            Wheelchair Mobility     Tilt Bed    Modified Rankin (Stroke Patients Only)       Balance Overall balance assessment: Mild deficits observed, not formally tested                                           Pertinent Vitals/Pain Pain Assessment Pain Assessment: No/denies pain    Home Living Family/patient expects to be discharged to:: Private residence Living Arrangements: Children Available Help at Discharge: Family;Available 24 hours/day Type of Home: House Home Access: Stairs to enter Entrance Stairs-Rails: Right Entrance Stairs-Number of Steps: 2   Home Layout: One level Home Equipment: Wheelchair - manual;Cane - single point;Rollator (4 wheels)      Prior Function Prior Level of Function : Independent/Modified Independent  Mobility Comments: rollator in home, cane for outside ADLs Comments: patient has sons stay with her they rotate each week.     Extremity/Trunk Assessment        Lower Extremity Assessment Lower Extremity Assessment: Generalized weakness       Communication   Communication Communication: No apparent difficulties    Cognition Arousal: Alert Behavior During Therapy: WFL for tasks assessed/performed   PT - Cognitive impairments: No apparent impairments                         Following commands: Intact       Cueing       General Comments      Exercises     Assessment/Plan     PT Assessment Patient does not need any further PT services  PT Problem List         PT Treatment Interventions      PT Goals (Current goals can be found in the Care Plan section)  Acute Rehab PT Goals Patient Stated Goal: home today PT Goal Formulation: All assessment and education complete, DC therapy    Frequency       Co-evaluation               AM-PAC PT 6 Clicks Mobility  Outcome Measure Help needed turning from your back to your side while in a flat bed without using bedrails?: None Help needed moving from lying on your back to sitting on the side of a flat bed without using bedrails?: None Help needed moving to and from a bed to a chair (including a wheelchair)?: A Little Help needed standing up from a chair using your arms (e.g., wheelchair or bedside chair)?: A Little Help needed to walk in hospital room?: A Little Help needed climbing 3-5 steps with a railing? : A Little 6 Click Score: 20    End of Session Equipment Utilized During Treatment: Gait belt Activity Tolerance: Patient tolerated treatment well Patient left: in chair;with call bell/phone within reach;with family/visitor present Nurse Communication: Mobility status PT Visit Diagnosis: Difficulty in walking, not elsewhere classified (R26.2)    Time: 9042-8993 PT Time Calculation (min) (ACUTE ONLY): 9 min   Charges:   PT Evaluation $PT Eval Low Complexity: 1 Low   PT General Charges $$ ACUTE PT VISIT: 1 Visit        Tari PT, DPT Physical Therapist Acute Rehabilitation Services Office: [phone]   Tari CROME Payson 10/14/2023, 2:34 PM

## 2023-10-14 NOTE — Hospital Course (Addendum)
 89 year old woman PMH including CKD, diabetes, presented with generalized weakness, shakiness, dysuria.  Admitted for UTI.  Initially concern for sepsis but this was subsequently ruled out.  Responded well to empiric antibiotic therapy and was discharged home in good condition.  Consultants None  Procedures/Events None

## 2023-10-14 NOTE — Plan of Care (Signed)
  Problem: Education: Goal: Knowledge of General Education information will improve Description: Including pain rating scale, medication(s)/side effects and non-pharmacologic comfort measures Outcome: Completed/Met   Problem: Health Behavior/Discharge Planning: Goal: Ability to manage health-related needs will improve Outcome: Completed/Met   Problem: Clinical Measurements: Goal: Ability to maintain clinical measurements within normal limits will improve Outcome: Completed/Met Goal: Will remain free from infection Outcome: Completed/Met Goal: Diagnostic test results will improve Outcome: Completed/Met Goal: Respiratory complications will improve Outcome: Completed/Met Goal: Cardiovascular complication will be avoided Outcome: Completed/Met   Problem: Activity: Goal: Risk for activity intolerance will decrease Outcome: Completed/Met   Problem: Nutrition: Goal: Adequate nutrition will be maintained Outcome: Completed/Met   Problem: Coping: Goal: Level of anxiety will decrease Outcome: Completed/Met   Problem: Elimination: Goal: Will not experience complications related to bowel motility Outcome: Completed/Met Goal: Will not experience complications related to urinary retention Outcome: Completed/Met   Problem: Pain Managment: Goal: General experience of comfort will improve and/or be controlled Outcome: Completed/Met   Problem: Safety: Goal: Ability to remain free from injury will improve Outcome: Completed/Met   Problem: Skin Integrity: Goal: Risk for impaired skin integrity will decrease Outcome: Completed/Met   Problem: Education: Goal: Ability to describe self-care measures that may prevent or decrease complications (Diabetes Survival Skills Education) will improve Outcome: Completed/Met Goal: Individualized Educational Video(s) Outcome: Completed/Met   Problem: Coping: Goal: Ability to adjust to condition or change in health will improve Outcome:  Completed/Met   Problem: Fluid Volume: Goal: Ability to maintain a balanced intake and output will improve Outcome: Completed/Met   Problem: Health Behavior/Discharge Planning: Goal: Ability to identify and utilize available resources and services will improve Outcome: Completed/Met Goal: Ability to manage health-related needs will improve Outcome: Completed/Met   Problem: Metabolic: Goal: Ability to maintain appropriate glucose levels will improve Outcome: Completed/Met   Problem: Nutritional: Goal: Maintenance of adequate nutrition will improve Outcome: Completed/Met Goal: Progress toward achieving an optimal weight will improve Outcome: Completed/Met   Problem: Skin Integrity: Goal: Risk for impaired skin integrity will decrease Outcome: Completed/Met   Problem: Tissue Perfusion: Goal: Adequacy of tissue perfusion will improve Outcome: Completed/Met  Pt with discharge orders

## 2023-10-14 NOTE — TOC Transition Note (Signed)
 Transition of Care [facility]) - Discharge Note   Patient Details  Name: [name] MRN: [id number] Date of Birth: 1929-07-27  Transition of [facility]) CM/SW Contact:  Sonda Manuella Quill, RN Phone Number: 10/14/2023, 11:24 AM   Clinical Narrative:    D/C orders received; no TOC needs.   Final next level of care: Home/Self Care Barriers to Discharge: No Barriers Identified   Patient Goals and CMS Choice Patient states their goals for this hospitalization and ongoing recovery are:: pt's son Daisha Filosa says she will return home CMS Medicare.gov Compare Post Acute Care list provided to:: Patient Represenative (must comment) Ritta Hammes (son) [phone])   [place] ownership interest in [facility].provided to:: Adult Children    Discharge Placement                       Discharge Plan and Services Additional resources added to the After Visit Summary for     Discharge Planning Services: CM Consult                                 Social Drivers of Health (SDOH) Interventions SDOH Screenings   Food Insecurity: No Food Insecurity (10/13/2023)  Housing: Low Risk  (10/13/2023)  Transportation Needs: No Transportation Needs (10/13/2023)  Utilities: Not At Risk (10/13/2023)  Depression (PHQ2-9): Low Risk  (09/22/2022)  Social Connections: Moderately Integrated (10/12/2023)  Tobacco Use: Low Risk  (10/12/2023)     Readmission Risk Interventions    11/03/2022    1:11 PM  Readmission Risk Prevention Plan  Post Dischage Appt Complete  Medication Screening Complete  Transportation Screening Complete

## 2023-10-14 NOTE — Plan of Care (Signed)
  Problem: Clinical Measurements: Goal: Diagnostic test results will improve Outcome: Progressing   Problem: Coping: Goal: Level of anxiety will decrease Outcome: Progressing   Problem: Safety: Goal: Ability to remain free from injury will improve Outcome: Progressing   

## 2023-10-14 NOTE — Discharge Summary (Addendum)
 Physician Discharge Summary   Patient: [name] MRN: [id number] DOB: 1929-12-18  Admit date:     10/12/2023  Discharge date: 10/14/23  Discharge Physician: Toribio Door   PCP: Jude Lonell FORBES, FNP   Recommendations at discharge:   Urine culture pending  Discharge Diagnoses: Principal Problem:   UTI  Active Problems:   Type 2 diabetes mellitus with complication, with long-term current use of insulin  (HCC)   CKD stage 3b, GFR 30-44 ml/min (HCC)   Hyperlipidemia associated with type 2 diabetes mellitus (HCC)   Hypertension   Lactic acidosis  Resolved Problems:   Severe sepsis (HCC)   Neuropathy due to type 2 diabetes mellitus [facility])  Hospital Course: 89 year old woman PMH including CKD, diabetes, presented with generalized weakness, shakiness, dysuria.  Admitted for UTI.  Initially concern for sepsis but this was subsequently ruled out.  Responded well to empiric antibiotic therapy and was discharged home in good condition.  Consultants None  Procedures/Events None   Acute UTI POA - sepsis ruled out Was documented as having sepsis at presentation but vitals and overall clinical situation did not support this diagnosis - she did have a clear UTI POA  Responding well to empiric ceftriaxone .  Urine culture gram-negative rods still pending identification and sensitivity.  Will transition to oral antibiotics and follow-up culture data.   Can follow-up with PCP as needed.     Lactic acidosis Appears to have been due to to simple volume depletion -corrected with volume resuscitation   Hypercalcemia Likely simply due to volume depletion in setting of poor intake over the last few days related to her active UTI Resolved.   CKD stage IIIb Renal function stable at present   Mild hypokalemia Replete.   Normocytic anemia Very mild, likely related to his CKD and advanced age.   DM2 Requires Levemir  and metformin  at home -A1c 8.2 -given advanced age there is no  indication for strict CBG control  Updated your just son at bedside.  Seen by therapy with no recommendation for outpatient follow-up.  Disposition: Home Diet recommendation:  Regular diet DISCHARGE MEDICATION: Allergies as of 10/14/2023       Reactions   Ciprofloxacin  Other (See Comments)   Dizziness   Morphine And Codeine Other (See Comments)   fuzzy feeling        Medication List     TAKE these medications    aspirin  EC 81 MG tablet Take 81 mg by mouth daily. Swallow whole.   Basaglar  KwikPen 100 UNIT/ML Inject 15 Units into the skin at bedtime.   bisoprolol -hydrochlorothiazide  2.5-6.25 MG tablet Commonly known as: ZIAC  Take  1 tablet  Daily  for BP                                /                                                                   TAKE                                         BY  MOUTH   cefixime 400 MG Caps capsule Commonly known as: SUPRAX Take 1 capsule (400 mg total) by mouth daily.   Centrum Silver 50+Women Tabs Take 1 tablet by mouth daily.   HAIR SKIN AND NAILS FORMULA PO Take by mouth daily.   insulin  lispro 100 UNIT/ML injection Commonly known as: HUMALOG  Inject 5-10 Units into the skin 3 (three) times daily with meals. Per sliding scale   Melatonin 10 MG Tabs Take 5 mg by mouth at bedtime.   metFORMIN  1000 MG tablet Commonly known as: GLUCOPHAGE  Take 1,000 mg by mouth 2 (two) times daily.   rosuvastatin  20 MG tablet Commonly known as: CRESTOR  Take   1 tablet  Daily for Cholesterol                                                                 /                                                          TAKE                                              BY                                        MOUTH                                      ONCE ? DAILY What changed:  how much to take how to take this when to take this additional instructions   traMADol  50 MG tablet Commonly known as:  ULTRAM  Take 50 mg by mouth every 6 (six) hours as needed for moderate pain. Pt only takes 2 pills per night        Follow-up Information     Jude Lonell BRAVO, FNP Follow up.   Specialty: Family Medicine Why: As needed Contact information: [address] [place] [address] [phone]                Feels better, wants to go home  Discharge Exam: [name]   10/12/23 1510  Weight: 53.6 kg   Physical Exam Vitals reviewed.  Constitutional:      General: She is not in acute distress.    Appearance: She is not ill-appearing or toxic-appearing.   Neurological:     Mental Status: She is alert.      Condition at discharge: good  The results of significant diagnostics from this hospitalization (including imaging, microbiology, ancillary and laboratory) are listed below for reference.   Imaging Studies: DG Chest Port 1 View Result Date: 10/12/2023 CLINICAL DATA:  Questionable sepsis - evaluate for abnormality EXAM: PORTABLE CHEST - 1 VIEW COMPARISON:  11/02/2022 FINDINGS: Lungs are clear. Heart size and mediastinal  contours are within normal limits. Aortic Atherosclerosis (ICD10-170.0). No effusion. Vertebral endplate spurring at multiple levels in the mid and lower thoracic spine. IMPRESSION: No acute cardiopulmonary disease. Electronically Signed   By: JONETTA Faes M.D.   On: 10/12/2023 17:22    Microbiology: Results for orders placed or performed during the hospital encounter of 10/12/23  Blood Culture (routine single)     Status: None (Preliminary result)   Collection Time: 10/12/23  3:13 PM   Specimen: BLOOD RIGHT FOREARM  Result Value Ref Range Status   Specimen Description   Final    BLOOD RIGHT FOREARM Performed at [facility], [address], [place], [place] [address]    Special Requests   Final    BOTTLES DRAWN AEROBIC AND ANAEROBIC Blood Culture adequate volume Performed at [facility], [address],  [place], [place] [address]    Culture   Final    NO GROWTH 2 DAYS Performed at [facility], [address] [place], [place] [address]    Report Status PENDING  Incomplete  Resp panel by RT-PCR (RSV, Flu A&B, Covid)     Status: None   Collection Time: 10/12/23  4:58 PM   Specimen: Nasal Swab  Result Value Ref Range Status   SARS Coronavirus 2 by RT PCR NEGATIVE NEGATIVE Final    Comment: (NOTE) SARS-CoV-2 target nucleic acids are NOT DETECTED.  The SARS-CoV-2 RNA is generally detectable in upper respiratory specimens during the acute phase of infection. The lowest concentration of SARS-CoV-2 viral copies this assay can detect is 138 copies/mL. A negative result does not preclude SARS-Cov-2 infection and should not be used as the sole basis for treatment or other patient management decisions. A negative result may occur with  improper specimen collection/handling, submission of specimen other than nasopharyngeal swab, presence of viral mutation(s) within the areas targeted by this assay, and inadequate number of viral copies(<138 copies/mL). A negative result must be combined with clinical observations, patient history, and epidemiological information. The expected result is Negative.  Fact Sheet for Patients:  BloggerCourse.com  Fact Sheet for Healthcare Providers:  SeriousBroker.it  This test is no t yet approved or cleared by the [place]  FDA and  has been authorized for detection and/or diagnosis of SARS-CoV-2 by FDA under an Emergency Use Authorization (EUA). This EUA will remain  in effect (meaning this test can be used) for the duration of the COVID-19 declaration under Section 564(b)(1) of the Act, 21 U.S.C.section 360bbb-3(b)(1), unless the authorization is terminated  or revoked sooner.       Influenza A by PCR NEGATIVE NEGATIVE Final   Influenza B by PCR NEGATIVE NEGATIVE Final    Comment: (NOTE) The Xpert  Xpress SARS-CoV-2/FLU/RSV plus assay is intended as an aid in the diagnosis of influenza from Nasopharyngeal swab specimens and should not be used as a sole basis for treatment. Nasal washings and aspirates are unacceptable for Xpert Xpress SARS-CoV-2/FLU/RSV testing.  Fact Sheet for Patients: BloggerCourse.com  Fact Sheet for Healthcare Providers: SeriousBroker.it  This test is not yet approved or cleared by the [place]  FDA and has been authorized for detection and/or diagnosis of SARS-CoV-2 by FDA under an Emergency Use Authorization (EUA). This EUA will remain in effect (meaning this test can be used) for the duration of the COVID-19 declaration under Section 564(b)(1) of the Act, 21 U.S.C. section 360bbb-3(b)(1), unless the authorization is terminated or revoked.     Resp Syncytial Virus by PCR NEGATIVE NEGATIVE  Final    Comment: (NOTE) Fact Sheet for Patients: BloggerCourse.com  Fact Sheet for Healthcare Providers: SeriousBroker.it  This test is not yet approved or cleared by the [place]  FDA and has been authorized for detection and/or diagnosis of SARS-CoV-2 by FDA under an Emergency Use Authorization (EUA). This EUA will remain in effect (meaning this test can be used) for the duration of the COVID-19 declaration under Section 564(b)(1) of the Act, 21 U.S.C. section 360bbb-3(b)(1), unless the authorization is terminated or revoked.  Performed at [facility], [address] [place], [place] [address]   Urine Culture     Status: Abnormal (Preliminary result)   Collection Time: 10/12/23  4:58 PM   Specimen: Urine, Random  Result Value Ref Range Status   Specimen Description   Final    URINE, RANDOM Performed at [facility], [address] [place], [place] [address]    Special Requests   Final    NONE Reflexed  from [id number] Performed at [facility], [address] [place], [place] [address]    Culture (A)  Final    >=100,000 COLONIES/mL GRAM NEGATIVE RODS CULTURE REINCUBATED FOR BETTER GROWTH Performed at [facility], [address] [place], [place] [address]    Report Status PENDING  Incomplete  Blood Culture (routine x 2)     Status: None (Preliminary result)   Collection Time: 10/12/23  5:00 PM   Specimen: BLOOD RIGHT WRIST  Result Value Ref Range Status   Specimen Description   Final    BLOOD RIGHT WRIST Performed at [facility], [address] [place], [place] [address]    Special Requests   Final    BOTTLES DRAWN AEROBIC AND ANAEROBIC Blood Culture adequate volume Performed at [facility], [address], [place], [place] [address]    Culture   Final    NO GROWTH 2 DAYS Performed at [facility], [address] [place], [place] [address]    Report Status PENDING  Incomplete    Labs: CBC: Recent Labs  Lab 10/12/23 1516 10/13/23 0350 10/13/23 0634 10/14/23 0500  WBC 12.1* 8.5 12.1* 12.8*  NEUTROABS 7.9*  --   --   --   HGB 11.9* 7.1* 10.7* 11.1*  HCT 35.9* 22.6* 32.8* 34.2*  MCV 95.0 101.8* 95.9 97.4  PLT 329 179 264 281   Basic Metabolic Panel: Recent Labs  Lab 10/12/23 1516 10/13/23 0350 10/13/23 0634 10/14/23 0500  NA 139 138 141 143  K 4.8 3.7 3.4* 3.4*  CL 96* 103 103 104  CO2 26 21* 27 26  GLUCOSE 240* 161* 182* 153*  BUN 24* 18 22 21   CREATININE 1.44* 0.85 1.18* 1.32*  CALCIUM  10.4* 8.0* 8.8* 9.3  MG  --   --   --  1.7   Liver Function Tests: Recent Labs  Lab 10/12/23 1516 10/13/23 0350 10/13/23 0634  AST 27 15 20   ALT 13 10 14   ALKPHOS 63 34* 43  BILITOT 0.3 0.4 0.6  PROT 7.5 4.3* 5.9*  ALBUMIN 3.9 1.9* 2.7*   CBG: Recent Labs  Lab 10/13/23 1112 10/13/23 1654 10/13/23 2117 10/14/23 0724 10/14/23 0748  GLUCAP 225* 177* 190* 152* 162*    Discharge time spent: greater  than 30 minutes.  Signed: Toribio Door, MD [facility] 10/14/2023

## 2023-10-15 LAB — URINE CULTURE: Culture: >=100000 — AB

## 2023-10-17 LAB — CULTURE, BLOOD (SINGLE)
Culture: NO GROWTH
Special Requests: ADEQUATE

## 2023-10-17 LAB — CULTURE, BLOOD (ROUTINE X 2)
Culture: NO GROWTH
Special Requests: ADEQUATE

## 2023-11-10 ENCOUNTER — Encounter: Payer: Self-pay | Admitting: Advanced Practice Midwife

## 2024-03-18 ENCOUNTER — Other Ambulatory Visit: Payer: Self-pay

## 2024-03-18 ENCOUNTER — Observation Stay (HOSPITAL_COMMUNITY)

## 2024-03-18 ENCOUNTER — Emergency Department (HOSPITAL_COMMUNITY)

## 2024-03-18 ENCOUNTER — Observation Stay (HOSPITAL_COMMUNITY): Admission: EM | Admit: 2024-03-18 | Discharge: 2024-03-19 | Disposition: A | Discharge status: Home / Self Care

## 2024-03-18 DIAGNOSIS — I129 Hypertensive chronic kidney disease with stage 1 through stage 4 chronic kidney disease, or unspecified chronic kidney disease: Secondary | ICD-10-CM | POA: Insufficient documentation

## 2024-03-18 DIAGNOSIS — I6389 Other cerebral infarction: Principal | ICD-10-CM | POA: Insufficient documentation

## 2024-03-18 DIAGNOSIS — G9341 Metabolic encephalopathy: Secondary | ICD-10-CM | POA: Diagnosis not present

## 2024-03-18 DIAGNOSIS — Z79899 Other long term (current) drug therapy: Secondary | ICD-10-CM | POA: Diagnosis not present

## 2024-03-18 DIAGNOSIS — Z87440 Personal history of urinary (tract) infections: Secondary | ICD-10-CM | POA: Diagnosis not present

## 2024-03-18 DIAGNOSIS — Z1152 Encounter for screening for COVID-19: Secondary | ICD-10-CM | POA: Diagnosis not present

## 2024-03-18 DIAGNOSIS — N1831 Chronic kidney disease, stage 3a: Secondary | ICD-10-CM | POA: Insufficient documentation

## 2024-03-18 DIAGNOSIS — G9340 Encephalopathy, unspecified: Principal | ICD-10-CM

## 2024-03-18 DIAGNOSIS — G8929 Other chronic pain: Secondary | ICD-10-CM | POA: Diagnosis not present

## 2024-03-18 DIAGNOSIS — E1169 Type 2 diabetes mellitus with other specified complication: Secondary | ICD-10-CM | POA: Insufficient documentation

## 2024-03-18 DIAGNOSIS — I358 Other nonrheumatic aortic valve disorders: Secondary | ICD-10-CM | POA: Insufficient documentation

## 2024-03-18 DIAGNOSIS — E7849 Other hyperlipidemia: Secondary | ICD-10-CM | POA: Diagnosis not present

## 2024-03-18 DIAGNOSIS — Z7901 Long term (current) use of anticoagulants: Secondary | ICD-10-CM | POA: Diagnosis not present

## 2024-03-18 DIAGNOSIS — N390 Urinary tract infection, site not specified: Secondary | ICD-10-CM | POA: Diagnosis not present

## 2024-03-18 DIAGNOSIS — Z794 Long term (current) use of insulin: Secondary | ICD-10-CM | POA: Diagnosis not present

## 2024-03-18 DIAGNOSIS — E1122 Type 2 diabetes mellitus with diabetic chronic kidney disease: Secondary | ICD-10-CM | POA: Insufficient documentation

## 2024-03-18 DIAGNOSIS — E785 Hyperlipidemia, unspecified: Secondary | ICD-10-CM | POA: Insufficient documentation

## 2024-03-18 DIAGNOSIS — R4182 Altered mental status, unspecified: Secondary | ICD-10-CM | POA: Diagnosis present

## 2024-03-18 DIAGNOSIS — Z7982 Long term (current) use of aspirin: Secondary | ICD-10-CM | POA: Insufficient documentation

## 2024-03-18 LAB — CBC
HCT: 35.8 % — ABNORMAL LOW (ref 36.0–46.0)
Hemoglobin: 12 g/dL (ref 12.0–15.0)
MCH: 31.7 pg (ref 26.0–34.0)
MCHC: 33.5 g/dL (ref 30.0–36.0)
MCV: 94.7 fL (ref 80.0–100.0)
Platelets: 245 K/uL (ref 150–400)
RBC: 3.78 MIL/uL — ABNORMAL LOW (ref 3.87–5.11)
RDW: 13.2 % (ref 11.5–15.5)
WBC: 10.2 K/uL (ref 4.0–10.5)
nRBC: 0 % (ref 0.0–0.2)

## 2024-03-18 LAB — COMPREHENSIVE METABOLIC PANEL WITH GFR
ALT: 12 U/L (ref 0–44)
AST: 20 U/L (ref 15–41)
Albumin: 3.6 g/dL (ref 3.5–5.0)
Alkaline Phosphatase: 40 U/L (ref 38–126)
Anion gap: 11 (ref 5–15)
BUN: 20 mg/dL (ref 8–23)
CO2: 26 mmol/L (ref 22–32)
Calcium: 9.1 mg/dL (ref 8.9–10.3)
Chloride: 101 mmol/L (ref 98–111)
Creatinine, Ser: 1.21 mg/dL — ABNORMAL HIGH (ref 0.44–1.00)
GFR, Estimated: 42 mL/min — ABNORMAL LOW (ref >60)
Glucose, Bld: 147 mg/dL — ABNORMAL HIGH (ref 70–99)
Potassium: 3.6 mmol/L (ref 3.5–5.1)
Sodium: 138 mmol/L (ref 135–145)
Total Bilirubin: 0.8 mg/dL (ref 0.0–1.2)
Total Protein: 7 g/dL (ref 6.5–8.1)

## 2024-03-18 LAB — CBG MONITORING, ED
Glucose-Capillary: 149 mg/dL — ABNORMAL HIGH (ref 70–99)
Glucose-Capillary: 283 mg/dL — ABNORMAL HIGH (ref 70–99)

## 2024-03-18 LAB — URINALYSIS, ROUTINE W REFLEX MICROSCOPIC
Bacteria, UA: NONE SEEN
Glucose, UA: 50 mg/dL — AB
Hgb urine dipstick: NEGATIVE
Ketones, ur: 5 mg/dL — AB
Leukocytes,Ua: NEGATIVE
Nitrite: NEGATIVE
Protein, ur: 30 mg/dL — AB
Specific Gravity, Urine: 1.01 (ref 1.005–1.030)
pH: 6 (ref 5.0–8.0)

## 2024-03-18 LAB — RESP PANEL BY RT-PCR (RSV, FLU A&B, COVID)  RVPGX2
Influenza A by PCR: NEGATIVE
Influenza B by PCR: NEGATIVE
Resp Syncytial Virus by PCR: NEGATIVE
SARS Coronavirus 2 by RT PCR: NEGATIVE

## 2024-03-18 LAB — AMMONIA: Ammonia: 17 umol/L (ref 9–35)

## 2024-03-18 MED ORDER — METFORMIN HCL 500 MG PO TABS
1000.0000 mg | ORAL_TABLET | Freq: Two times a day (BID) | ORAL | Status: DC
  Administered 2024-03-19: 1000 mg via ORAL
  Filled 2024-03-18: qty 2

## 2024-03-18 MED ORDER — ACETAMINOPHEN 325 MG PO TABS
650.0000 mg | ORAL_TABLET | Freq: Four times a day (QID) | ORAL | Status: DC | PRN
  Administered 2024-03-19: 650 mg via ORAL
  Filled 2024-03-18: qty 2

## 2024-03-18 MED ORDER — ONDANSETRON HCL 4 MG PO TABS: 4.0000 mg | ORAL_TABLET | Freq: Four times a day (QID) | ORAL | Status: DC | PRN

## 2024-03-18 MED ORDER — ONDANSETRON HCL 4 MG/2ML IJ SOLN: 4.0000 mg | Freq: Four times a day (QID) | INTRAMUSCULAR | Status: DC | PRN

## 2024-03-18 MED ORDER — INSULIN ASPART 100 UNIT/ML IJ SOLN
0.0000 [IU] | Freq: Three times a day (TID) | INTRAMUSCULAR | Status: DC
  Administered 2024-03-19 (×2): 2 [IU] via SUBCUTANEOUS
  Filled 2024-03-18: qty 1
  Filled 2024-03-18: qty 2

## 2024-03-18 MED ORDER — CEPHALEXIN 500 MG PO CAPS
500.0000 mg | ORAL_CAPSULE | Freq: Two times a day (BID) | ORAL | Status: DC
  Administered 2024-03-19 (×2): 500 mg via ORAL
  Filled 2024-03-18: qty 1
  Filled 2024-03-18: qty 2

## 2024-03-18 MED ORDER — ROSUVASTATIN CALCIUM 5 MG PO TABS
10.0000 mg | ORAL_TABLET | Freq: Every day | ORAL | Status: DC
  Administered 2024-03-19: 10 mg via ORAL
  Filled 2024-03-18: qty 2

## 2024-03-18 MED ORDER — TRAMADOL HCL 50 MG PO TABS: 50.0000 mg | ORAL_TABLET | Freq: Four times a day (QID) | ORAL | Status: DC | PRN

## 2024-03-18 MED ORDER — ENOXAPARIN SODIUM 30 MG/0.3ML IJ SOSY
30.0000 mg | PREFILLED_SYRINGE | INTRAMUSCULAR | Status: DC
  Administered 2024-03-19: 30 mg via SUBCUTANEOUS
  Filled 2024-03-18: qty 0.3

## 2024-03-18 MED ORDER — ASPIRIN 81 MG PO TBEC
81.0000 mg | DELAYED_RELEASE_TABLET | Freq: Every day | ORAL | Status: DC
  Administered 2024-03-19: 81 mg via ORAL
  Filled 2024-03-18: qty 1

## 2024-03-18 MED ORDER — INSULIN GLARGINE-YFGN 100 UNIT/ML ~~LOC~~ SOLN
8.0000 [IU] | Freq: Every day | SUBCUTANEOUS | Status: DC
  Administered 2024-03-19: 8 [IU] via SUBCUTANEOUS
  Filled 2024-03-18 (×2): qty 0.08

## 2024-03-18 MED ORDER — BISOPROLOL FUMARATE 5 MG PO TABS
2.5000 mg | ORAL_TABLET | Freq: Every day | ORAL | Status: DC
  Filled 2024-03-18: qty 0.5

## 2024-03-18 NOTE — ED Provider Notes (Signed)
 [place] EMERGENCY DEPARTMENT AT [facility] Provider Note   CSN: [id number] Arrival date & time: 03/18/24  1242     Patient presents with: Altered Mental Status   [name] is a 89 y.o. female.   89 year old female with past medical history of diabetes, hypertension, and hyperlipidemia presenting to the emergency department today with altered mental status.  The patient apparently to bed at 6:30 PM was not abnormal for her.  Her son noted that when she got up to use the bathroom at around 11 PM that she was confused.  She was worse this morning and was hard to arouse.  The patient has otherwise been in her normal state of health up until yesterday.  They do go to a funeral for a family member yesterday.  She seemed to be acting normally afterwards.  She does have a history of recurrent urinary tract infections as well as a TIA a few months ago where she presented similarly.  Initially she was unable to tell medics her name but this seems to be improving now she is brought to the emergency department.   Altered Mental Status      Prior to Admission medications   Medication Sig Start Date End Date Taking? Authorizing Provider  acetaminophen  (TYLENOL ) 325 MG tablet Take 650 mg by mouth every 6 (six) hours as needed for moderate pain (pain score 4-6).   Yes [provider]  aspirin  EC 81 MG tablet Take 81 mg by mouth daily. Swallow whole.   Yes [provider]  bisoprolol  (ZEBETA ) 5 MG tablet Take 2.5 mg by mouth daily.   Yes [provider]  cephALEXin  (KEFLEX ) 500 MG capsule Take 500 mg by mouth 2 (two) times daily.   Yes [provider]  CRANBERRY PO Take 1 capsule by mouth daily.   Yes [provider]  Cranberry-Vitamin C-Probiotic (AZO CRANBERRY) 250-30 MG TABS Take 1 tablet by mouth every morning.   Yes [provider]  Insulin  Glargine (BASAGLAR  KWIKPEN) 100 UNIT/ML Inject 15 Units into the skin at bedtime.    Yes [provider]  melatonin 5 MG TABS Take 5 mg by mouth at bedtime.   Yes [provider]  metFORMIN  (GLUCOPHAGE ) 1000 MG tablet Take 1,000 mg by mouth 2 (two) times daily.   Yes [provider]  Multiple Vitamins-Minerals (CENTRUM SILVER 50+WOMEN) TABS Take 1 tablet by mouth daily.   Yes [provider]  rosuvastatin  (CRESTOR ) 10 MG tablet Take 10 mg by mouth daily.   Yes [provider]  traMADol  (ULTRAM ) 50 MG tablet Take 50 mg by mouth every 6 (six) hours as needed for moderate pain. Pt only takes 2 pills per night   Yes [provider]  estradiol (ESTRACE) 0.01 % CREA vaginal cream Place 1 Applicatorful vaginally at bedtime. Patient not taking: Reported on 03/18/2024    [provider]    Allergies: Ciprofloxacin  and Morphine and codeine    Review of Systems  Reason unable to perform ROS: Altered mental status.    Updated Vital Signs BP (!) 169/93   Pulse 75   Temp 97.8 F (36.6 C) (Oral)   Resp 18   SpO2 100%   Physical Exam Vitals and nursing note reviewed.   Gen: NAD Eyes: PERRL, EOMI HEENT: no oropharyngeal swelling Neck: trachea midline Resp: clear to auscultation bilaterally Card: RRR, no murmurs, rubs, or gallops Abd: nontender, nondistended Extremities: no calf tenderness, no edema Vascular: 2+ radial pulses  bilaterally, 2+ DP pulses bilaterally Neuro: Cranial nerves intact, equal strength sensation throughout bilateral upper and lower extremities, patient is alert and oriented x 2 but is not oriented to time Skin: no rashes Psyc: acting appropriately   (all labs ordered are listed, but only abnormal results are displayed) Labs Reviewed  COMPREHENSIVE METABOLIC PANEL WITH GFR - Abnormal; Notable for the following components:      Result Value   Glucose, Bld 147 (*)    Creatinine, Ser 1.21 (*)    GFR, Estimated 42 (*)    All other components within normal limits  CBC - Abnormal; Notable for  the following components:   RBC 3.78 (*)    HCT 35.8 (*)    All other components within normal limits  URINALYSIS, ROUTINE W REFLEX MICROSCOPIC - Abnormal; Notable for the following components:   Glucose, UA 50 (*)    Bilirubin Urine SMALL (*)    Ketones, ur 5 (*)    Protein, ur 30 (*)    All other components within normal limits  CBG MONITORING, ED - Abnormal; Notable for the following components:   Glucose-Capillary 149 (*)    All other components within normal limits  RESP PANEL BY RT-PCR (RSV, FLU A&B, COVID)  [id number]  AMMONIA    EKG: EKG Interpretation Date/Time:  Monday March 18 2024 13:02:48 EST Ventricular Rate:  70 PR Interval:  186 QRS Duration:  84 QT Interval:  390 QTC Calculation: 421 R Axis:   50  Text Interpretation: Normal sinus rhythm Cannot rule out Anterior infarct , age undetermined Abnormal ECG When compared with ECG of 12-Oct-2023 16:48, PREVIOUS ECG IS PRESENT Confirmed by Ula Barter [phone]) on 03/18/2024 3:39:17 PM  Radiology: [facility] Chest Portable 1 View Result Date: 03/18/2024 CLINICAL DATA:  Confusion EXAM: PORTABLE CHEST 1 VIEW COMPARISON:  10/12/2023 FINDINGS: The heart size and mediastinal contours are within normal limits. Both lungs are clear. The visualized skeletal structures are unremarkable. IMPRESSION: No active disease. Electronically Signed   By: Ozell Daring M.D.   On: 03/18/2024 16:53   CT Head Wo Contrast Result Date: 03/18/2024 CLINICAL DATA:  Mental status change, altered mental status. EXAM: CT HEAD WITHOUT CONTRAST TECHNIQUE: Contiguous axial images were obtained from the base of the skull through the vertex without intravenous contrast. RADIATION DOSE REDUCTION: This exam was performed according to the departmental dose-optimization program which includes automated exposure control, adjustment of the mA and/or kV according to patient size and/or use of iterative reconstruction technique. COMPARISON:  MRI head 12/10/2021. FINDINGS:  Brain: No acute intracranial hemorrhage. Remote infarct in the right frontal periventricular white matter similar appearance of moderate chronic microvascular ischemic changes. No edema, mass effect, or midline shift. The basilar cisterns are patent. Ventricles: Prominence of the ventricles suggesting underlying parenchymal volume loss. Vascular: Atherosclerosis of the carotid siphons and intracranial vertebral arteries. Skull: No acute or aggressive finding. Orbits: Bilateral lens replacement. Sinuses: The visualized paranasal sinuses are clear. Other: Mastoid air cells are clear. IMPRESSION: No CT evidence of acute intracranial abnormality. Moderate chronic microvascular ischemic changes. 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 Electronically Signed   By: Donnice Mania M.D.   On: 03/18/2024 16:41     Procedures   Medications Ordered in the ED - No data to display                                  Medical Decision  Making 89 year old female with past medical history of diabetes, hypertension, and hyperlipidemia as well as TIA in the past presenting to the emergency department today with altered mental status.  I will further evaluate the patient here with basic labs to evaluate for electrolyte abnormalities.  Will obtain an ammonia level here as well as a urinalysis, RSV/COVID/flu swab, and chest x-ray to eval for infectious etiologies.  I will obtain a CT scan of her head to evaluate for intracranial hemorrhage or mass lesion.  The patient has been having some mild memory issues lately so this may be due to to underlying dementia that is undiagnosed.  She does not have any signs of meningitis at this time.  I will reevaluate for ultimate disposition.  The patient CT scan shows microvascular changes.  Labs and urinalysis are largely unremarkable.  MRI is ordered to evaluate for potential CVA but the patient is outside the window for any acute intervention.  Calls placed to  hospitalist service for admission for acute encephalopathy.  Amount and/or Complexity of Data Reviewed Labs: ordered. Radiology: ordered.  Risk Decision regarding hospitalization.        Final diagnoses:  Acute encephalopathy    ED Discharge Orders     None          Ula Prentice SAUNDERS, MD 03/18/24 [phone]

## 2024-03-18 NOTE — ED Triage Notes (Signed)
 Patient from home via EMS for AMS. LKW 1900 yesterday and son had a difficult time waking the patient up this morning and she wasn't talking. A/O x 2 with EMS and confusion improving since time of pick up. Being treated x 1 month for UTI. Stroke 4 months ago and EMS reports negative stroke screen.

## 2024-03-18 NOTE — H&P (Signed)
 History and Physical    [name] FMW:[id number] DOB: 06-30-1929 DOA: 03/18/2024  I have briefly reviewed the patient's prior medical records in [facility]  PCP: Kelsey Pagan, NP  Patient coming from: home  Chief Complaint: AMS  HPI: [name] is a 89 y.o. female with medical history significant of DM2, HTN, HLD, comes to the hospital with complaints of altered mental status.  The son is at bedside and tells me that when she went to bed last night, she was slightly confused, and went to bed very early which is abnormal for her.  She woke up later that evening, remained confused and again this morning.  The day prior to coming to the hospital she was in her normal state of health, attended a funeral, was fine and interacting well with everybody around her.  In the ED, patient denies any chest pain, denies any abdominal discomfort, no nausea or vomiting.  She denies any fever or chills.  She denies any dysuria.  She is alert to place, situation but could not tell me the year or the month.  ED Course: In the ED she is afebrile 97.8, blood pressure has some high readings in the 170s, she is satting well on room air.  Blood work is fairly unremarkable with a creatinine of 1.2, urinalysis with no bacteria or leukocytosis.  CT scan of the brain was fairly unremarkable.  An MRI was ordered, and we are asked to admit for encephalopathy  Review of Systems: All systems reviewed, and apart from HPI, all negative  Past Medical History:  Diagnosis Date   Arthritis    Chronic back pain    Cystoid macular edema of right eye 08/19/2019   Diabetic neuropathy (HCC)    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) 11/18/2019   Hypercholesteremia    Hypertension    Liver lesion    Neuropathy    Type II or unspecified type diabetes mellitus without mention of complication, not stated as uncontrolled    Vitamin D  deficiency    Wears  glasses     Past Surgical History:  Procedure Laterality Date   ABDOMINAL HYSTERECTOMY     APPENDECTOMY     BACK SURGERY  1993   lumb lam-fusion   BREAST EXCISIONAL BIOPSY Right 1967   benign   CESAREAN SECTION     x 3    COLONOSCOPY     EYE SURGERY     both cataracts   TRIGGER FINGER RELEASE  01/10/2012   Procedure: RELEASE TRIGGER FINGER/A-1 PULLEY;  Surgeon: Marqueta KANDICE Herald, MD;  Location: [facility];  Service: Orthopedics;  Laterality: Right;  right ring trigger release     reports that she has never smoked. She has never used smokeless tobacco. She reports that she does not drink alcohol and does not use drugs.  Allergies  Allergen Reactions   Ciprofloxacin  Other (See Comments)    Dizziness    Morphine And Codeine Other (See Comments)    fuzzy feeling    Family History  Problem Relation Age of Onset   Diabetes Sister    Liver disease Sister    Liver cancer Sister    Lymphoma Sister    Stroke Brother    Diabetes Brother    Kidney cancer Brother    Colon cancer Neg Hx     Prior to Admission medications   Medication Sig Start Date End Date Taking? Authorizing Provider  acetaminophen  (  TYLENOL ) 325 MG tablet Take 650 mg by mouth every 6 (six) hours as needed for moderate pain (pain score 4-6).   Yes [provider]  aspirin  EC 81 MG tablet Take 81 mg by mouth daily. Swallow whole.   Yes [provider]  bisoprolol  (ZEBETA ) 5 MG tablet Take 2.5 mg by mouth daily.   Yes [provider]  cephALEXin  (KEFLEX ) 500 MG capsule Take 500 mg by mouth 2 (two) times daily.   Yes [provider]  CRANBERRY PO Take 1 capsule by mouth daily.   Yes [provider]  Cranberry-Vitamin C-Probiotic (AZO CRANBERRY) 250-30 MG TABS Take 1 tablet by mouth every morning.   Yes [provider]  Insulin  Glargine (BASAGLAR  KWIKPEN) 100 UNIT/ML Inject 15 Units into the skin at bedtime.   Yes [provider]  melatonin  5 MG TABS Take 5 mg by mouth at bedtime.   Yes [provider]  metFORMIN  (GLUCOPHAGE ) 1000 MG tablet Take 1,000 mg by mouth 2 (two) times daily.   Yes [provider]  Multiple Vitamins-Minerals (CENTRUM SILVER 50+WOMEN) TABS Take 1 tablet by mouth daily.   Yes [provider]  rosuvastatin  (CRESTOR ) 10 MG tablet Take 10 mg by mouth daily.   Yes [provider]  traMADol  (ULTRAM ) 50 MG tablet Take 50 mg by mouth every 6 (six) hours as needed for moderate pain. Pt only takes 2 pills per night   Yes [provider]  estradiol (ESTRACE) 0.01 % CREA vaginal cream Place 1 Applicatorful vaginally at bedtime. Patient not taking: Reported on 03/18/2024    [provider]    Physical Exam: Vitals:   03/18/24 1523 03/18/24 1545 03/18/24 1600 03/18/24 1730  BP: (!) 169/93 (!) 182/82 136/82 (!) 189/78  Pulse: 75 72 72 69  Resp: 18 17 19 14   Temp: 97.8 F (36.6 C)     TempSrc: Oral     SpO2: 100% 100% 99% 100%      Constitutional: NAD, calm, comfortable Eyes: PERRL, lids and conjunctivae normal ENMT: Mucous membranes are moist. Posterior pharynx clear of any exudate or lesions. Neck: normal, supple Respiratory: clear to auscultation bilaterally, no wheezing, no crackles. Normal respiratory effort. No accessory muscle use.  Cardiovascular: Regular rate and rhythm, no murmurs / rubs / gallops. No extremity edema. 2+ pedal pulses.  Abdomen: no tenderness, no masses palpated. Bowel sounds positive.  Musculoskeletal: no clubbing / cyanosis. Normal muscle tone.  Skin: no rashes, lesions, ulcers. No induration Neurologic: No focal deficits, alert to self, situation but not time  Labs on Admission: I have personally reviewed following labs and imaging studies  CBC: Recent Labs  Lab 03/18/24 1325  WBC 10.2  HGB 12.0  HCT 35.8*  MCV 94.7  PLT 245   Basic Metabolic Panel: Recent Labs  Lab 03/18/24 1325  NA 138  K 3.6  CL 101  CO2 26   GLUCOSE 147*  BUN 20  CREATININE 1.21*  CALCIUM  9.1   Liver Function Tests: Recent Labs  Lab 03/18/24 1325  AST 20  ALT 12  ALKPHOS 40  BILITOT 0.8  PROT 7.0  ALBUMIN 3.6   Coagulation Profile: No results for input(s): INR, PROTIME in the last 168 hours. BNP (last 3 results) No results for input(s): PROBNP in the last 8760 hours. CBG: Recent Labs  Lab 03/18/24 1319  GLUCAP 149*   Thyroid  Function Tests: No results for input(s): TSH, T4TOTAL, FREET4, T3FREE, THYROIDAB in the last 72 hours. Urine  analysis:    Component Value Date/Time   COLORURINE YELLOW 03/18/2024 1630   APPEARANCEUR CLEAR 03/18/2024 1630   LABSPEC 1.010 03/18/2024 1630   PHURINE 6.0 03/18/2024 1630   GLUCOSEU 50 (A) 03/18/2024 1630   HGBUR NEGATIVE 03/18/2024 1630   BILIRUBINUR SMALL (A) 03/18/2024 1630   KETONESUR 5 (A) 03/18/2024 1630   PROTEINUR 30 (A) 03/18/2024 1630   UROBILINOGEN 0.2 07/29/2012 1237   NITRITE NEGATIVE 03/18/2024 1630   LEUKOCYTESUR NEGATIVE 03/18/2024 1630     Radiological Exams on Admission: DG Chest Portable 1 View Result Date: 03/18/2024 CLINICAL DATA:  Confusion EXAM: PORTABLE CHEST 1 VIEW COMPARISON:  10/12/2023 FINDINGS: The heart size and mediastinal contours are within normal limits. Both lungs are clear. The visualized skeletal structures are unremarkable. IMPRESSION: No active disease. Electronically Signed   By: Ozell Daring M.D.   On: 03/18/2024 16:53   CT Head Wo Contrast Result Date: 03/18/2024 CLINICAL DATA:  Mental status change, altered mental status. EXAM: CT HEAD WITHOUT CONTRAST TECHNIQUE: Contiguous axial images were obtained from the base of the skull through the vertex without intravenous contrast. RADIATION DOSE REDUCTION: This exam was performed according to the departmental dose-optimization program which includes automated exposure control, adjustment of the mA and/or kV according to patient size and/or use of iterative  reconstruction technique. COMPARISON:  MRI head 12/10/2021. FINDINGS: Brain: No acute intracranial hemorrhage. Remote infarct in the right frontal periventricular white matter similar appearance of moderate chronic microvascular ischemic changes. No edema, mass effect, or midline shift. The basilar cisterns are patent. Ventricles: Prominence of the ventricles suggesting underlying parenchymal volume loss. Vascular: Atherosclerosis of the carotid siphons and intracranial vertebral arteries. Skull: No acute or aggressive finding. Orbits: Bilateral lens replacement. Sinuses: The visualized paranasal sinuses are clear. Other: Mastoid air cells are clear. IMPRESSION: No CT evidence of acute intracranial abnormality. Moderate chronic microvascular ischemic changes. 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 Electronically Signed   By: Donnice Mania M.D.   On: 03/18/2024 16:41   EKG: Independently reviewed.  Sinus rhythm  Assessment/Plan Principal problem Acute metabolic encephalopathy-admit patient to the hospital, she still confused in the ED per son.  There is no evidence of UTI, pneumonia or any other infectious sources.  Underwent a CT scan of the head in the ED which was negative for acute findings, an MRI has been ordered by EDP and is pending - Monitor fever curve, white count, low threshold to do antibiotics if she becomes febrile  Active problems Essential hypertension-hypertensive in the ED, resume home medications  CKD 3A-baseline creatinine appears to be ranging anywhere from 1.1-1.4, currently she is at baseline.  Recurrent UTIs-she is on suppressive Keflex , prescribed by urology, continue while here.  Son tells me that she recently had a UTI while on suppressive antibiotics and had to change antibiotics for about 10 days.  Hyperlipidemia-continue statin  Chronic pain - continue home tramadol   Insulin -dependent diabetes mellitus -started her home long-acting insulin   at a lower dose, placed on sliding scale.  Her most recent A1c June 2025 was 8.2 which is appropriate in her age group  Lab Results  Component Value Date   HGBA1C 8.2 (H) 10/12/2023    DVT prophylaxis: Lovenox  Code Status: Full code, patient's son at bedside unsure how to answer CODE STATUS so we will assume full code for now.  Will address in the morning again Family Communication: Son present at bedside Bed Type: MedSurg Consults called: None Obs/Inp: Observation  Nilda Fendt, MD,  PhD [facility]  Contact via [email or web address]  03/18/2024, 5:48 PM

## 2024-03-19 ENCOUNTER — Encounter (HOSPITAL_COMMUNITY): Payer: Self-pay | Admitting: Internal Medicine

## 2024-03-19 ENCOUNTER — Other Ambulatory Visit: Payer: Self-pay | Admitting: Cardiology

## 2024-03-19 ENCOUNTER — Other Ambulatory Visit (HOSPITAL_COMMUNITY): Payer: Self-pay

## 2024-03-19 DIAGNOSIS — R29701 NIHSS score 1: Secondary | ICD-10-CM

## 2024-03-19 DIAGNOSIS — I639 Cerebral infarction, unspecified: Secondary | ICD-10-CM

## 2024-03-19 LAB — GLUCOSE, CAPILLARY
Glucose-Capillary: 187 mg/dL — ABNORMAL HIGH (ref 70–99)
Glucose-Capillary: 220 mg/dL — ABNORMAL HIGH (ref 70–99)

## 2024-03-19 LAB — CBG MONITORING, ED
Glucose-Capillary: 173 mg/dL — ABNORMAL HIGH (ref 70–99)
Glucose-Capillary: 208 mg/dL — ABNORMAL HIGH (ref 70–99)

## 2024-03-19 MED ORDER — BISOPROLOL FUMARATE 5 MG PO TABS
5.0000 mg | ORAL_TABLET | Freq: Every day | ORAL | Status: DC
  Administered 2024-03-19: 5 mg via ORAL
  Filled 2024-03-19 (×2): qty 1

## 2024-03-19 MED ORDER — CLOPIDOGREL BISULFATE 75 MG PO TABS
75.0000 mg | ORAL_TABLET | Freq: Every day | ORAL | Status: DC
  Administered 2024-03-19: 75 mg via ORAL
  Filled 2024-03-19: qty 1

## 2024-03-19 MED ORDER — STROKE: EARLY STAGES OF RECOVERY BOOK: Freq: Once | Status: DC

## 2024-03-19 MED ORDER — CLOPIDOGREL BISULFATE 75 MG PO TABS
75.0000 mg | ORAL_TABLET | Freq: Every day | ORAL | 0 refills | Status: DC
  Filled 2024-03-19: qty 21, 21d supply, fill #0

## 2024-03-19 NOTE — Progress Notes (Signed)
 Pt Dcing with son. AVS reviewed and all lines removed. VSS.

## 2024-03-19 NOTE — Progress Notes (Deleted)
 PROGRESS NOTE  [name] FMW:[id number] DOB: Dec 26, 1929 DOA: 03/18/2024 PCP: [name], NP   LOS: 0 days   Brief Narrative / Interim history:  [name] is a 89 y.o. female with medical history significant of DM2, HTN, HLD, comes to the hospital with complaints of altered mental status and increased weakness.  Family reports that she was confused the day prior to admission, went to bed early which is abnormal for her.  She woke up with worsening confusion and weakness the next morning and she was brought to the hospital.  Her cause for her weakness was not initially clear but an MRI showed an acute CVA and neurology consulted  Subjective / 24h Interval events: She is extremely confused this morning, does not know where she is, the year, or why she is here.  Assesement and Plan: Principal problem Acute CVA -MRI on admission showed a 2 mm focus of acute infarct in the left parietal lobe, along with chronic ischemic changes and remote infarcts.  Neurology consulted, evaluated patient this morning.  Patient had a recent admission just 2 months ago in [place] with a TIA, and completed full evaluation there.  Head and neck CTA did not show any proximal high-grade stenosis.  Underwent a 2D echocardiogram which showed LVEF more than 70%, mild aortic valve calcification, no shunt by agitated saline.  She was discharged home aspirin  and Plavix  for 21-day and then aspirin  alone, was also recommended to wear a 30-day Holter monitor.  Will message cardiology - Discussed with neurology, given recent workup it was not recommended to repeat echo or CT angiogram, and to follow-up on ensuring that she does get a 30-day cardiac monitor.  She was recommended to be on aspirin  and Plavix  for 21 days followed by aspirin  alone - PT/OT evaluation pending  Active problems Acute metabolic encephalopathy-possibly in the setting of #1 but less likely, this morning she is quite confused raising concerns more so  for hospital induced delirium  Essential hypertension-continue home medications, keep normotensive as recommended by neurology, increase bisoprolol  dose this morning due to high BP  CKD 3A-baseline creatinine 1.1-1.4, currently at baseline  Recurrent UTIs-urinalysis unremarkable.  Continue suppressive Keflex  as prescribed by urology  Hyperlipidemia-continue statin  Chronic pain-continue home tramadol   Insulin -dependent diabetes mellitus -A1c 8.2 which is appropriate in her age group.  Continue sliding scale  Scheduled Meds:  [START ON 03/20/2024]  stroke: early stages of recovery book   Does not apply Once   aspirin  EC  81 mg Oral Daily   bisoprolol   5 mg Oral Daily   cephALEXin   500 mg Oral BID   clopidogrel   75 mg Oral Daily   enoxaparin  (LOVENOX ) injection  30 mg Subcutaneous Q24H   insulin  aspart  0-9 Units Subcutaneous TID WC   insulin  glargine-yfgn  8 Units Subcutaneous QHS   metFORMIN   1,000 mg Oral BID   rosuvastatin   10 mg Oral Daily   Continuous Infusions: PRN Meds:.acetaminophen , ondansetron  **OR** ondansetron  (ZOFRAN ) IV, traMADol   Current Outpatient Medications  Medication Instructions   acetaminophen  (TYLENOL ) 650 mg, Oral, Every 6 hours PRN   aspirin  EC 81 mg, Oral, Daily, Swallow whole.   Basaglar  KwikPen 15 Units, Subcutaneous, Daily at bedtime   bisoprolol  (ZEBETA ) 2.5 mg, Oral, Daily   cephALEXin  (KEFLEX ) 500 mg, 2 times daily   CRANBERRY PO 1 capsule, Daily   Cranberry-Vitamin C-Probiotic (AZO CRANBERRY) 250-30 MG TABS 1 tablet, Oral, Every morning   estradiol (ESTRACE) 0.01 % CREA vaginal cream  1 Applicatorful, Daily at bedtime   melatonin 5 mg, Oral, Daily at bedtime   metFORMIN  (GLUCOPHAGE ) 1,000 mg, Oral, 2 times daily   Multiple Vitamins-Minerals (CENTRUM SILVER 50+WOMEN) TABS 1 tablet, Oral, Daily   rosuvastatin  (CRESTOR ) 10 mg, Oral, Daily   traMADol  (ULTRAM ) 50 mg, Oral, Every 6 hours PRN, Pt only takes 2 pills per night    Diet Orders (From  admission, onward)     Start     Ordered   03/19/24 0912  Diet regular Fluid consistency: Thin  Diet effective now       Question:  Fluid consistency:  Answer:  Thin   03/19/24 0912            DVT prophylaxis: enoxaparin  (LOVENOX ) injection 30 mg Start: 03/19/24 1000   Lab Results  Component Value Date   PLT 245 03/18/2024      Code Status: Full Code  Family Communication: Will discuss with son when he gets here, this morning he was not at bedside  Status is: Observation The patient will require care spanning > 2 midnights and should be moved to inpatient because: Persistent encephalopathy  Level of care: Med-Surg  Consultants:  Neurology  Objective: Vitals:   03/19/24 0600 03/19/24 0615 03/19/24 0630 03/19/24 0645  BP: (!) 164/79 (!) 182/91 (!) 197/81 (!) 177/76  Pulse: 69 81 74 73  Resp:    19  Temp:    (!) 97.5 F (36.4 C)  TempSrc:    Axillary  SpO2: 99% 100% 100% 100%  Weight:      Height:        Intake/Output Summary (Last 24 hours) at 03/19/2024 9057 Last data filed at 03/19/2024 0458 Gross per 24 hour  Intake --  Output 200 ml  Net -200 ml   Wt Readings from Last 3 Encounters:  03/18/24 49.4 kg  10/12/23 53.6 kg  04/20/23 53.6 kg    Examination:  Constitutional: She is in no distress, quite confused Eyes: no scleral icterus ENMT: Mucous membranes are moist.  Neck: normal, supple Respiratory: clear to auscultation bilaterally, no wheezing, no crackles.  Cardiovascular: Regular rate and rhythm, no murmurs / rubs / gallops. No LE edema.  Abdomen: non distended, no tenderness. Bowel sounds positive.  Musculoskeletal: no clubbing / cyanosis.   Data Reviewed: I have independently reviewed following labs and imaging studies   CBC Recent Labs  Lab 03/18/24 1325  WBC 10.2  HGB 12.0  HCT 35.8*  PLT 245  MCV 94.7  MCH 31.7  MCHC 33.5  RDW 13.2    Recent Labs  Lab 03/18/24 1325 03/18/24 1815  NA 138  --   K 3.6  --   CL 101  --    CO2 26  --   GLUCOSE 147*  --   BUN 20  --   CREATININE 1.21*  --   CALCIUM  9.1  --   AST 20  --   ALT 12  --   ALKPHOS 40  --   BILITOT 0.8  --   ALBUMIN 3.6  --   AMMONIA  --  17    ------------------------------------------------------------------------------------------------------------------ No results for input(s): CHOL, HDL, LDLCALC, TRIG, CHOLHDL, LDLDIRECT in the last 72 hours.  Lab Results  Component Value Date   HGBA1C 8.2 (H) 10/12/2023   ------------------------------------------------------------------------------------------------------------------ No results for input(s): TSH, T4TOTAL, T3FREE, THYROIDAB in the last 72 hours.  Invalid input(s): FREET3  Cardiac Enzymes No results for input(s): CKMB, TROPONINI, MYOGLOBIN in the last 168 hours.  Invalid input(s): CK ------------------------------------------------------------------------------------------------------------------ No results found for: BNP  CBG: Recent Labs  Lab 03/18/24 1319 03/18/24 2156 03/19/24 0011 03/19/24 0826  GLUCAP 149* 283* 208* 173*    Recent Results (from the past 240 hours)  Resp panel by RT-PCR (RSV, Flu A&B, Covid) Anterior Nasal Swab     Status: None   Collection Time: 03/18/24  4:49 PM   Specimen: Anterior Nasal Swab  Result Value Ref Range Status   SARS Coronavirus 2 by RT PCR NEGATIVE NEGATIVE Final   Influenza A by PCR NEGATIVE NEGATIVE Final   Influenza B by PCR NEGATIVE NEGATIVE Final    Comment: (NOTE) The Xpert Xpress SARS-CoV-2/FLU/RSV plus assay is intended as an aid in the diagnosis of influenza from Nasopharyngeal swab specimens and should not be used as a sole basis for treatment. Nasal washings and aspirates are unacceptable for Xpert Xpress SARS-CoV-2/FLU/RSV testing.  Fact Sheet for Patients: bloggercourse.com  Fact Sheet for Healthcare  Providers: seriousbroker.it  This test is not yet approved or cleared by the [place]  FDA and has been authorized for detection and/or diagnosis of SARS-CoV-2 by FDA under an Emergency Use Authorization (EUA). This EUA will remain in effect (meaning this test can be used) for the duration of the COVID-19 declaration under Section 564(b)(1) of the Act, 21 U.S.C. section 360bbb-3(b)(1), unless the authorization is terminated or revoked.     Resp Syncytial Virus by PCR NEGATIVE NEGATIVE Final    Comment: (NOTE) Fact Sheet for Patients: bloggercourse.com  Fact Sheet for Healthcare Providers: seriousbroker.it  This test is not yet approved or cleared by the [place]  FDA and has been authorized for detection and/or diagnosis of SARS-CoV-2 by FDA under an Emergency Use Authorization (EUA). This EUA will remain in effect (meaning this test can be used) for the duration of the COVID-19 declaration under Section 564(b)(1) of the Act, 21 U.S.C. section 360bbb-3(b)(1), unless the authorization is terminated or revoked.  Performed at [facility], [address] [place], [place] [address]      Radiology Studies: MR BRAIN WO CONTRAST Result Date: 03/18/2024 EXAM: MRI BRAIN WITHOUT CONTRAST 03/18/2024 06:36:00 PM TECHNIQUE: Multiplanar multisequence MRI of the head/brain was performed without the administration of intravenous contrast. COMPARISON: Same day CT head and MRI head 12/10/2021. CLINICAL HISTORY: Mental status change, unknown cause. FINDINGS: BRAIN AND VENTRICLES: There is a 2 mm focus of diffusion signal abnormality in the left parietal lobe. There are prominent areas of T2 and FLAIR hyperintensity throughout the periventricular and subcortical white matter suggestive of moderate chronic microvascular ischemic changes. 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 Additional  remote lacunar infarct in the right thalamus with associated area of chronic microhemorrhage. Additional signal abnormality in the pons related to chronic microvascular ischemic changes. Chronic microhemorrhage in the left external capsule. Small remote infarcts in the left cerebellum which are similar to prior bilateral lens replacements. There is mild age related volume loss. No mass. No midline shift. No hydrocephalus. The sella is unremarkable. Normal flow voids. ORBITS: No acute abnormality. SINUSES AND MASTOIDS: No acute abnormality. BONES AND SOFT TISSUES: Normal marrow signal. No acute soft tissue abnormality. IMPRESSION: 1. 2 mm focus of acute infarct in the left parietal lobe. 2. Moderate chronic microvascular ischemic changes. 3. Remote infarcts in the right frontal periventricular white matter, right thalamus with associated chronic microhemorrhage, and left cerebellum, similar to prior. Electronically signed by: Donnice Mania MD 03/18/2024 07:43 PM EST RP Workstation: [id number]   DG Chest  Portable 1 View Result Date: 03/18/2024 CLINICAL DATA:  Confusion EXAM: PORTABLE CHEST 1 VIEW COMPARISON:  10/12/2023 FINDINGS: The heart size and mediastinal contours are within normal limits. Both lungs are clear. The visualized skeletal structures are unremarkable. IMPRESSION: No active disease. Electronically Signed   By: Ozell Daring M.D.   On: 03/18/2024 16:53   CT Head Wo Contrast Result Date: 03/18/2024 CLINICAL DATA:  Mental status change, altered mental status. EXAM: CT HEAD WITHOUT CONTRAST TECHNIQUE: Contiguous axial images were obtained from the base of the skull through the vertex without intravenous contrast. RADIATION DOSE REDUCTION: This exam was performed according to the departmental dose-optimization program which includes automated exposure control, adjustment of the mA and/or kV according to patient size and/or use of iterative reconstruction technique. COMPARISON:  MRI head 12/10/2021.  FINDINGS: Brain: No acute intracranial hemorrhage. Remote infarct in the right frontal periventricular white matter similar appearance of moderate chronic microvascular ischemic changes. No edema, mass effect, or midline shift. The basilar cisterns are patent. Ventricles: Prominence of the ventricles suggesting underlying parenchymal volume loss. Vascular: Atherosclerosis of the carotid siphons and intracranial vertebral arteries. Skull: No acute or aggressive finding. Orbits: Bilateral lens replacement. Sinuses: The visualized paranasal sinuses are clear. Other: Mastoid air cells are clear. IMPRESSION: No CT evidence of acute intracranial abnormality. Moderate chronic microvascular ischemic changes. 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 Electronically Signed   By: Donnice Mania M.D.   On: 03/18/2024 16:41   Nilda Fendt, MD, PhD [facility]  Between 7 am - 7 pm I am available, please contact me via Amion (for emergencies) or Securechat (non urgent messages)  Between 7 pm - 7 am I am not available, please contact night coverage MD/APP via Amion

## 2024-03-19 NOTE — Evaluation (Addendum)
 Occupational Therapy Evaluation Patient Details Name: [name] MRN: [id number] DOB: 1929-09-03 Today's Date: 03/19/2024   History of Present Illness   Pt is a 89y.o. female admitted on 03/18/24 from home with AMS. Her PMH includes: DM2, HTN, and HLD. 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     Clinical Impressions Pt received in bed, SLP finishing up evaluation. Supportive son at bedside. AOX3, re-oriented to situation. PTA, pt was indep with ADLs and mobility with rollator. Has assist from sons for IADLs and a caregiver who comes to assist with showering. Functionally, pt is presenting near her baseline - CGA for UB/LB ADLs and CGA-min A for mobility with RW in room. Pt with one brief LOB, needing min A to correct. She is forgetful and with reduced insight into deficits; pleasant & participatory. Pt discharged at time of documentation.  Pt is currently functioning below baseline and would benefit from ongoing acute OT services to progress towards safe discharge and to facilitate return to prior level of function. Current recommendation is home with home health OT.     If plan is discharge home, recommend the following:   A little help with walking and/or transfers;A little help with bathing/dressing/bathroom;Assistance with cooking/housework;Direct supervision/assist for medications management;Direct supervision/assist for financial management;Assist for transportation;Supervision due to cognitive status     Functional Status Assessment   Patient has had a recent decline in their functional status and demonstrates the ability to make significant improvements in function in a reasonable and predictable amount of time.     Equipment Recommendations   None recommended by OT     Recommendations for Other Services         Precautions/Restrictions   Precautions Precautions: Fall Recall of Precautions/Restrictions:  Impaired Restrictions Weight Bearing Restrictions Per Provider Order: No     Mobility Bed Mobility Overal bed mobility: Needs Assistance Bed Mobility: Supine to Sit, Sit to Supine     Supine to sit: Supervision, HOB elevated, Used rails Sit to supine: Supervision, HOB elevated, Used rails   General bed mobility comments: exited to the L side    Transfers Overall transfer level: Needs assistance Equipment used: Rolling walker (2 wheels) Transfers: Sit to/from Stand Sit to Stand: Contact guard assist           General transfer comment: Stood from EOB with VC for hand placement relative to RW.      Balance Overall balance assessment: Needs assistance Sitting-balance support: Feet supported, Bilateral upper extremity supported Sitting balance-Leahy Scale: Fair Sitting balance - Comments: seated EOB, no overt LOB   Standing balance support: Single extremity supported, Bilateral upper extremity supported, During functional activity, Reliant on assistive device for balance Standing balance-Leahy Scale: Poor Standing balance comment: reliant on RW intermittently, although pt abandoning RW at times, min A for one brief LOB during mobility                           ADL either performed or assessed with clinical judgement   ADL Overall ADL's : Needs assistance/impaired Eating/Feeding: Set up   Grooming: Contact guard assist;Standing;Wash/dry hands   Upper Body Bathing: Set up   Lower Body Bathing: Minimal assistance   Upper Body Dressing : Set up   Lower Body Dressing: Contact guard assist;Sitting/lateral leans   Toilet Transfer: Contact guard assist;Ambulation;Regular Toilet   Toileting- Clothing Manipulation and Hygiene: Contact guard assist;Sitting/lateral lean;Sit to/from stand Toileting - Architect Details (  indicate cue type and reason): stability during pants hike in stance     Functional mobility during ADLs: Contact guard  assist;Rolling walker (2 wheels);Cueing for safety       Vision Baseline Vision/History: 0 No visual deficits Ability to See in Adequate Light: 0 Adequate Patient Visual Report: No change from baseline       Perception         Praxis         Pertinent Vitals/Pain Pain Assessment Pain Assessment: No/denies pain     Extremity/Trunk Assessment Upper Extremity Assessment Upper Extremity Assessment: Overall WFL for tasks assessed   Lower Extremity Assessment Lower Extremity Assessment: Generalized weakness;LLE deficits/detail;RLE deficits/detail RLE Deficits / Details: coordination deficits noted with rapid alternating mvoements and heel-to-shin; intact to light touch RLE Coordination: decreased gross motor LLE Deficits / Details: coordination deficits noted with rapid alternating movements and heel-to-shin; intact to light touch LLE Coordination: decreased gross motor   Cervical / Trunk Assessment Cervical / Trunk Assessment: Kyphotic   Communication Communication Communication: Impaired Factors Affecting Communication: Difficulty expressing self (intermittent word finding difficulty)   Cognition Arousal: Alert Behavior During Therapy: WFL for tasks assessed/performed Cognition: Cognition impaired   Orientation impairments: Time, Situation Awareness: Online awareness impaired Memory impairment (select all impairments): Short-term memory, Working memory Attention impairment (select first level of impairment): Sustained attention Executive functioning impairment (select all impairments): Reasoning OT - Cognition Comments: pleasant, intermittently forgetful                 Following commands: Impaired Following commands impaired: Only follows one step commands consistently, Follows multi-step commands with increased time     Cueing  General Comments   Cueing Techniques: Verbal cues;Gestural cues  supportive son, Cathlyn, present   Exercises     Shoulder  Instructions      Home Living Family/patient expects to be discharged to:: Private residence Living Arrangements: Children Available Help at Discharge: Family;Available 24 hours/day Type of Home: House Home Access: Stairs to enter [employer] of Steps: 2 Entrance Stairs-Rails: Right Home Layout: One level     Bathroom Shower/Tub: Producer, Television/film/video: Standard Bathroom Accessibility: Yes   Home Equipment: Wheelchair - manual;Cane - single point;Rollator (4 wheels);Shower seat - built in   Additional Comments: Son, Garrel, reports patient uses four wheel walker around the home independently. For community ambulation, she uses single point cane and hand hold assist from family  Lives With: Other (Comment) (three sons who rotate for care)    Prior Functioning/Environment Prior Level of Function : Needs assist       Physical Assist : Mobility (physical);ADLs (physical) Mobility (physical): Gait;Stairs ADLs (physical): Bathing;IADLs Mobility Comments: rollator in home, cane for outside; requires additional assistance from sons with hand hold assist for stairs and long distance ambulation ADLs Comments: pt's 3 son rotate staying with her to help complete IADLs. She has a CNA neighbor who comes by x2/week to help with showers. Son, Garrel, reports there is someone with her 24/7 and family provides transportation as needed. Son reports pt previously had home health PT/OT/RN that was really helpful however pt was discharged from care.    OT Problem List: Decreased strength;Decreased activity tolerance;Impaired balance (sitting and/or standing);Decreased cognition   OT Treatment/Interventions: Self-care/ADL training;Therapeutic exercise;Energy conservation;DME and/or AE instruction;Cognitive remediation/compensation;Therapeutic activities;Patient/family education;Balance training      OT Goals(Current goals can be found in the care plan section)   Acute Rehab OT  Goals Patient Stated Goal: go home OT Goal  Formulation: With patient Time For Goal Achievement: 04/02/24 Potential to Achieve Goals: Good   OT Frequency:  Min 2X/week    Co-evaluation              AM-PAC OT 6 Clicks Daily Activity     Outcome Measure Help from another person eating meals?: A Little Help from another person taking care of personal grooming?: A Little Help from another person toileting, which includes using toliet, bedpan, or urinal?: A Little Help from another person bathing (including washing, rinsing, drying)?: A Little Help from another person to put on and taking off regular upper body clothing?: None Help from another person to put on and taking off regular lower body clothing?: A Little 6 Click Score: 19   End of Session Equipment Utilized During Treatment: Gait belt;Rolling walker (2 wheels) Nurse Communication: Mobility status  Activity Tolerance: Patient tolerated treatment well Patient left: in bed;with call bell/phone within reach;with bed alarm set  OT Visit Diagnosis: Unsteadiness on feet (R26.81);History of falling (Z91.81);Muscle weakness (generalized) (M62.81)                Time: 8561-8540 OT Time Calculation (min): 21 min Charges:  OT General Charges $OT Visit: 1 Visit OT Evaluation $OT Eval Low Complexity: 1 Low  Ellean Firman M. [place], OTR/L [facility] Acute Rehabilitation Services [phone] Secure Chat Preferred  Daylyn Christine 03/19/2024, 4:48 PM

## 2024-03-19 NOTE — ED Notes (Signed)
Pt refusing swallow screen at this time 

## 2024-03-19 NOTE — ED Notes (Signed)
 IP unit notified of patient movement.

## 2024-03-19 NOTE — ED Notes (Addendum)
 [name]

## 2024-03-19 NOTE — Evaluation (Signed)
 Speech Language Pathology Evaluation Patient Details Name: [name] MRN: [id number] DOB: Sep 11, 1929 Today's Date: 03/19/2024 Time: 8571-8560 SLP Time Calculation (min) (ACUTE ONLY): 11 min  Problem List:  Patient Active Problem List   Diagnosis Date Noted   Acute metabolic encephalopathy 03/18/2024   CKD stage 3b, GFR 30-44 ml/min (HCC) 10/12/2023   Hypercalcemia 10/12/2023   Sepsis secondary to UTI (HCC) 10/12/2023   Lactic acidosis 10/12/2023   UTI (urinary tract infection) 11/02/2022   Acute kidney injury superimposed on chronic kidney disease 11/02/2022   Frequent UTI 02/10/2020   Advanced nonexudative age-related macular degeneration of left eye with subfoveal involvement 01/08/2020   Memory changes 12/31/2019   Poorly controlled type 2 diabetes mellitus (HCC) 12/31/2019   Poor compliance with medication 12/31/2019   Advanced nonexudative age-related macular degeneration of right eye with subfoveal involvement 11/18/2019   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) 11/18/2019   Exudative age-related macular degeneration of right eye with active choroidal neovascularization (HCC) 08/19/2019   Exudative age-related macular degeneration of left eye with active choroidal neovascularization (HCC) 08/19/2019   Long term (current) use of insulin  (HCC) 06/10/2019   Major depression, recurrent 12/20/2018   Type 2 diabetes mellitus with complication, with long-term current use of insulin  (HCC) 02/09/2017   Atherosclerosis of aorta (HCC) by AbdCTscan on 06/28/2017 11/06/2016   Encounter for Medicare annual wellness exam 05/18/2015   Medication management 08/06/2013   CKD stage 3 due to type 2 diabetes mellitus (HCC)    Hyperlipidemia associated with type 2 diabetes mellitus (HCC)    Vitamin D  deficiency    Hypertension 07/29/2012   Chronic pain 07/29/2012   Past Medical History:  Past Medical History:  Diagnosis Date    Arthritis    Chronic back pain    Cystoid macular edema of right eye 08/19/2019   Diabetic neuropathy (HCC)    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) 11/18/2019   Hypercholesteremia    Hypertension    Liver lesion    Neuropathy    Type II or unspecified type diabetes mellitus without mention of complication, not stated as uncontrolled    Vitamin D  deficiency    Wears glasses    Past Surgical History:  Past Surgical History:  Procedure Laterality Date   ABDOMINAL HYSTERECTOMY     APPENDECTOMY     BACK SURGERY  1993   lumb lam-fusion   BREAST EXCISIONAL BIOPSY Right 1967   benign   CESAREAN SECTION     x 3    COLONOSCOPY     EYE SURGERY     both cataracts   TRIGGER FINGER RELEASE  01/10/2012   Procedure: RELEASE TRIGGER FINGER/A-1 PULLEY;  Surgeon: Eleen KANDICE Herald, MD;  Location: [facility];  Service: Orthopedics;  Laterality: Right;  right ring trigger release   HPI:  Pt is a 89y.o. female admitted on 03/18/24 from home with AMS. Her PMH includes: DM2, HTN, and HLD. 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   Assessment / Plan / Recommendation Clinical Impression  Pt presents with cognitive function that is c/w baseline behaviors - impairments in working memory, awareness, and problem-solving.  There are no changes in speech, expressive or receptive language. Pt is interactive and conversational. She has 24 hour support available from her three sons.  There are no f/u SLP needs identified.  Pt and son, [name], agree with plan. Our service  will sign off.    SLP Assessment  SLP Recommendation/Assessment: Patient does not need any further Speech Language Pathology Services SLP Visit Diagnosis: Cognitive communication deficit (R41.841)     Assistance Recommended at Discharge   24 hr                 SLP Evaluation Cognition  Overall Cognitive Status: Within Functional  Limits for tasks assessed Arousal/Alertness: Awake/alert Orientation Level: Oriented to person;Oriented to place Attention: Sustained Sustained Attention: Appears intact Awareness: Impaired Awareness Impairment: Intellectual impairment Problem Solving: Impaired Problem Solving Impairment: Verbal basic       Comprehension  Auditory Comprehension Overall Auditory Comprehension: Appears within functional limits for tasks assessed Reading Comprehension Reading Status: Not tested    Expression Expression Primary Mode of Expression: Verbal Verbal Expression Overall Verbal Expression: Appears within functional limits for tasks assessed   Oral / Motor  Motor Speech Overall Motor Speech: Appears within functional limits for tasks assessed            Dennis Palma Laurice 03/19/2024, 2:45 PM Kelsey Reas L. Vona, MA CCC/SLP Clinical Specialist - Acute Care SLP Acute Rehabilitation Services Office number [phone]

## 2024-03-19 NOTE — Evaluation (Signed)
 Physical Therapy Evaluation Patient Details Name: [name] MRN: [id number] DOB: 1929/07/06 Today's Date: 03/19/2024  History of Present Illness  Pt is a 89y.o. female admitted on 03/18/24 from home with AMS. Her PMH includes: DM2, HTN, and HLD. 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  Clinical Impression  Prior to admission, pt was IND with functional mobility using a 4-wheel walker in the home, [facility] for community distances with hand help assist from sons, and living in a single level home with two steps to enter and single handrail. Her sons rotate and all assist to care for her and she has 24/7 support. Bathing assistance from a CNA neighbor x2/week, otherwise pt is independent with ADLs. She is currently functioning near her mobility baseline, however is limited due to cognitive deficits as well as impaired balance, decreased activity tolerance and decreased independence below her baseline. She will benefit from skilled therapy intervention while admitted to acute rehab, and would benefit from continued therapy following discharge. Pt's son, Garrel, reports she was previously doing home health PT, OT, and RN each week which greatly help her. PT will continue to follow.         If plan is discharge home, recommend the following: A little help with walking and/or transfers;A little help with bathing/dressing/bathroom;Assistance with cooking/housework;Assist for transportation   Can travel by private vehicle        Equipment Recommendations None recommended by PT  Recommendations for Other Services       Functional Status Assessment Patient has had a recent decline in their functional status and demonstrates the ability to make significant improvements in function in a reasonable and predictable amount of time.     Precautions / Restrictions Precautions Precautions: Fall Recall of Precautions/Restrictions: Impaired Restrictions Weight Bearing Restrictions  Per Provider Order: No      Mobility  Bed Mobility Overal bed mobility: Needs Assistance Bed Mobility: Supine to Sit, Sit to Supine     Supine to sit: Supervision, Used rails Sit to supine: Used rails, Supervision   General bed mobility comments: cues for sequencing and intiation of task    Transfers Overall transfer level: Needs assistance Equipment used: Rolling walker (2 wheels) Transfers: Sit to/from Stand Sit to Stand: Min assist           General transfer comment: min a to stand from low bed, verbal cues for increased anterior weight shift    Ambulation/Gait Ambulation/Gait assistance: Min assist Gait Distance (Feet): 25 Feet Assistive device: Rolling walker (2 wheels) Gait Pattern/deviations: Narrow base of support, Scissoring, Decreased stride length, Decreased weight shift to right, Decreased weight shift to left, Step-through pattern Gait velocity: decreased     General Gait Details: declined mobility out of room at time of eval, limited gait assessment to in room mobility; decreased step height bilaterally, slowed gait speed, scissoring increased with turning steps; overall son reports gait mechanics are near her baseline  Stairs            Wheelchair Mobility     Tilt Bed    Modified Rankin (Stroke Patients Only)       Balance Overall balance assessment: Needs assistance Sitting-balance support: Feet supported, Bilateral upper extremity supported Sitting balance-Leahy Scale: Fair Sitting balance - Comments: fear of falls when performing coordination assessment to BLE; required BUE support when feet unsupported   Standing balance support: Single extremity supported, Bilateral upper extremity supported Standing balance-Leahy Scale: Fair Standing balance comment: 1-2 UE support  in standing, RW required with ambulation                             Pertinent Vitals/Pain Pain Assessment Pain Assessment: No/denies pain    Home  Living Family/patient expects to be discharged to:: Private residence Living Arrangements: Children Available Help at Discharge: Family;Available 24 hours/day Type of Home: House Home Access: Stairs to enter Entrance Stairs-Rails: Right Entrance Stairs-Number of Steps: 2   Home Layout: One level Home Equipment: Wheelchair - manual;Cane - single point;Rollator (4 wheels);Shower seat - built in Additional Comments: Son, Garrel, reports patient uses four wheel walker around the home independently. For community ambulation, she uses single point cane and hand hold assist from family    Prior Function Prior Level of Function : Needs assist       Physical Assist : Mobility (physical);ADLs (physical) Mobility (physical): Gait;Stairs ADLs (physical): Bathing;IADLs Mobility Comments: rollator in home, cane for outside; requires additional assistance from sons with hand hold assist for stairs and long distance ambulation ADLs Comments: pt's 3 son rotate staying with her to help complete IADLs. She has a CNA neighbor who comes by x2/week to help with showers. Son, Garrel, reports there is someone with her 24/7 and family provides transportation as needed. Son reports pt previously had home health PT/OT/RN that was really helpful however pt was discharged from care.     Extremity/Trunk Assessment   Upper Extremity Assessment Upper Extremity Assessment: Defer to OT evaluation    Lower Extremity Assessment Lower Extremity Assessment: Generalized weakness;LLE deficits/detail;RLE deficits/detail RLE Deficits / Details: coordination deficits noted with rapid alternating mvoements and heel-to-shin; intact to light touch RLE Coordination: decreased gross motor LLE Deficits / Details: coordination deficits noted with rapid alternating movements and heel-to-shin; intact to light touch LLE Coordination: decreased gross motor    Cervical / Trunk Assessment Cervical / Trunk Assessment: Kyphotic   Communication   Communication Communication: No apparent difficulties    Cognition Arousal: Alert Behavior During Therapy: Anxious (reports fear of falling during dynamic seated assessment)   PT - Cognitive impairments: Orientation, Memory, Safety/Judgement, Problem solving, Sequencing   Orientation impairments: Person                   PT - Cognition Comments: oriented to name/DOB; with cues and given options pt able to identify that is is [place], however unable to state year; not oriented to place or situation Following commands: Impaired Following commands impaired: Only follows one step commands consistently, Follows multi-step commands with increased time     Cueing Cueing Techniques: Verbal cues, Gestural cues     General Comments      Exercises     Assessment/Plan    PT Assessment Patient needs continued PT services  PT Problem List Decreased coordination;Decreased cognition;Decreased balance;Decreased mobility;Decreased activity tolerance       PT Treatment Interventions Gait training;Therapeutic exercise;Balance training;Functional mobility training;Patient/family education;Stair training    PT Goals (Current goals can be found in the Care Plan section)  Acute Rehab PT Goals Patient Stated Goal: go home PT Goal Formulation: With patient/family Time For Goal Achievement: 04/02/24 Potential to Achieve Goals: Good    Frequency Min 3X/week     Co-evaluation               AM-PAC PT 6 Clicks Mobility  Outcome Measure Help needed turning from your back to your side while in a flat bed without using bedrails?: A Little  Help needed moving from lying on your back to sitting on the side of a flat bed without using bedrails?: A Little Help needed moving to and from a bed to a chair (including a wheelchair)?: A Little Help needed standing up from a chair using your arms (e.g., wheelchair or bedside chair)?: A Little Help needed to walk in hospital  room?: A Little Help needed climbing 3-5 steps with a railing? : A Lot 6 Click Score: 17    End of Session Equipment Utilized During Treatment: Gait belt Activity Tolerance: Patient tolerated treatment well Patient left: in bed;with call bell/phone within reach;with bed alarm set;with family/visitor present Nurse Communication: Mobility status PT Visit Diagnosis: Unsteadiness on feet (R26.81);Difficulty in walking, not elsewhere classified (R26.2)    Time: 8741-8673 PT Time Calculation (min) (ACUTE ONLY): 28 min   Charges:   PT Evaluation $PT Eval Low Complexity: 1 Low PT Treatments $Gait Training: 8-22 mins           Isaiah DEL. Rushil Kimbrell, PT, DPT   [employer] 03/19/2024, 1:50 PM

## 2024-03-19 NOTE — Consult Note (Signed)
 NEUROLOGY CONSULT NOTE   Date of service: March 19, 2024 Patient Name: [name] MRN:  [id number] DOB:  May 17, 1929 Chief Complaint: Stroke on MRI, altered mental status Requesting Provider: Trixie Nilda HERO, MD  History of Present Illness  [name] is a 89 y.o. female with hx of diabetes, hypertension, hyperlipidemia brought in for evaluation of altered mental status.  Imaging shows a small stroke for which neurology was consulted. No family at bedside. Review of chart reveals patient was last known well somewhere around 7 PM of 03/17/2024 and went to bed early.  She was sleeping more than normal and upon waking up remained confused for which the family brought her to the emergency department.  She denies any focal neurological symptoms.  She denies any chest pain shortness of breath nausea vomiting fevers chills. Her preliminary workup was concerning for hypertension but head imaging in the form of CT was unremarkable. An MRI was ordered which was completed overnight and showed a small punctate infarct which prompted neurological consultation  Chart review reveals a discharge summary from 01/02/2024 for a possible TIA for which she was seen at [facility] she was seen after she had slurred speech with left facial droop and altered mental status with negative brain imaging and unremarkable GI workup.  She had a UTI and CKD 3.  She also has a history of major depression that is recurrent and ongoing memory changes as listed in the discharge summary.  She was discharged on DAPT for 3 weeks followed by aspirin  only.   LKW: 7 PM 03/17/2024 Modified rankin score: Unable to reliably ascertain-patient poor historian, family not at bedside IV Thrombolysis: Outside the window EVT: Outside the window  NIHSS components Score: Comment  1a Level of Conscious 0[x]  1[]  2[]  3[]      1b LOC Questions 0[]  1[x]  2[]       1c LOC Commands 0[x]  1[]  2[]       2 Best Gaze 0[x]   1[]  2[]       3 Visual 0[x]  1[]  2[]  3[]      4 Facial Palsy 0[x]  1[]  2[]  3[]      5a Motor Arm - left 0[x]  1[]  2[]  3[]  4[]  UN[]    5b Motor Arm - Right 0[x]  1[]  2[]  3[]  4[]  UN[]    6a Motor Leg - Left 0[x]  1[]  2[]  3[]  4[]  UN[]    6b Motor Leg - Right 0[x]  1[]  2[]  3[]  4[]  UN[]    7 Limb Ataxia 0[x]  1[]  2[]  UN[]      8 Sensory 0[x]  1[]  2[]  UN[]      9 Best Language 0[x]  1[]  2[]  3[]      10 Dysarthria 0[x]  1[]  2[]  UN[]      11 Extinct. 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) 11/18/2019   Hypercholesteremia    Hypertension    Liver lesion    Neuropathy    Type II or unspecified type diabetes mellitus without mention of complication, not stated as uncontrolled    Vitamin D  deficiency    Wears glasses     Past Surgical History:  Procedure Laterality  Date   ABDOMINAL HYSTERECTOMY     APPENDECTOMY     BACK SURGERY  1993   lumb lam-fusion   BREAST EXCISIONAL BIOPSY Right 1967   benign   CESAREAN SECTION     x 3    COLONOSCOPY     EYE SURGERY     both cataracts   TRIGGER FINGER RELEASE  01/10/2012   Procedure: RELEASE TRIGGER FINGER/A-1 PULLEY;  Surgeon: Nayely KANDICE Herald, MD;  Location: [facility];  Service: Orthopedics;  Laterality: Right;  right ring trigger release    Family History: Family History  Problem Relation Age of Onset   Diabetes Sister    Liver disease Sister    Liver cancer Sister    Lymphoma Sister    Stroke Brother    Diabetes Brother    Kidney cancer Brother    Colon cancer Neg Hx     Social History  reports that she has never smoked. She has never used smokeless tobacco. She reports that she  does not drink alcohol and does not use drugs.  Allergies  Allergen Reactions   Ciprofloxacin  Other (See Comments)    Dizziness    Morphine And Codeine Other (See Comments)    fuzzy feeling    Medications   Current Facility-Administered Medications:    acetaminophen  (TYLENOL ) tablet 650 mg, 650 mg, Oral, Q6H PRN, Gherghe, Costin M, MD   aspirin  EC tablet 81 mg, 81 mg, Oral, Daily, Gherghe, Costin M, MD   bisoprolol  (ZEBETA ) tablet 2.5 mg, 2.5 mg, Oral, Daily, Gherghe, Costin M, MD   cephALEXin  (KEFLEX ) capsule 500 mg, 500 mg, Oral, BID, Gherghe, Costin M, MD, 500 mg at 03/19/24 0040   enoxaparin  (LOVENOX ) injection 30 mg, 30 mg, Subcutaneous, Q24H, Gherghe, Costin M, MD   insulin  aspart (novoLOG ) injection 0-9 Units, 0-9 Units, Subcutaneous, TID WC, Gherghe, Costin M, MD   insulin  glargine-yfgn (SEMGLEE ) injection 8 Units, 8 Units, Subcutaneous, QHS, Gherghe, Costin M, MD, 8 Units at 03/19/24 0040   metFORMIN  (GLUCOPHAGE ) tablet 1,000 mg, 1,000 mg, Oral, BID, Gherghe, Costin M, MD   ondansetron  (ZOFRAN ) tablet 4 mg, 4 mg, Oral, Q6H PRN **OR** ondansetron  (ZOFRAN ) injection 4 mg, 4 mg, Intravenous, Q6H PRN, Trixie, Costin M, MD   rosuvastatin  (CRESTOR ) tablet 10 mg, 10 mg, Oral, Daily, Gherghe, Costin M, MD   traMADol  (ULTRAM ) tablet 50 mg, 50 mg, Oral, Q6H PRN, Gherghe, Costin M, MD  Current Outpatient Medications:    acetaminophen  (TYLENOL ) 325 MG tablet, Take 650 mg by mouth every 6 (six) hours as needed for moderate pain (pain score 4-6)., Disp: , Rfl:    aspirin  EC 81 MG tablet, Take 81 mg by mouth daily. Swallow whole., Disp: , Rfl:    bisoprolol  (ZEBETA ) 5 MG tablet, Take 2.5 mg by mouth daily., Disp: , Rfl:    cephALEXin  (KEFLEX ) 500 MG capsule, Take 500 mg by mouth 2 (two) times daily., Disp: , Rfl:    CRANBERRY PO, Take 1 capsule by mouth daily., Disp: , Rfl:    Cranberry-Vitamin C-Probiotic (AZO CRANBERRY) 250-30 MG TABS, Take 1 tablet by mouth every morning., Disp: , Rfl:     Insulin  Glargine (BASAGLAR  KWIKPEN) 100 UNIT/ML, Inject 15 Units into the skin at bedtime., Disp: , Rfl:    melatonin 5 MG TABS, Take 5 mg by mouth at bedtime., Disp: , Rfl:    metFORMIN  (GLUCOPHAGE ) 1000 MG tablet, Take 1,000 mg by mouth 2 (two) times daily., Disp: , Rfl:    Multiple  Vitamins-Minerals (CENTRUM SILVER 50+WOMEN) TABS, Take 1 tablet by mouth daily., Disp: , Rfl:    rosuvastatin  (CRESTOR ) 10 MG tablet, Take 10 mg by mouth daily., Disp: , Rfl:    traMADol  (ULTRAM ) 50 MG tablet, Take 50 mg by mouth every 6 (six) hours as needed for moderate pain. Pt only takes 2 pills per night, Disp: , Rfl:    estradiol (ESTRACE) 0.01 % CREA vaginal cream, Place 1 Applicatorful vaginally at bedtime. (Patient not taking: Reported on 04-05-24), Disp: , Rfl:   Vitals   Vitals:   04/05/24 2300 03/19/24 0148 03/19/24 0345 03/19/24 0539  BP:  (!) 140/70 (!) 175/70   Pulse:  (!) 59 (!) 57   Resp:  20 14   Temp:  98 F (36.7 C)  (!) 97.4 F (36.3 C)  TempSrc:  Oral  Axillary  SpO2:  97% 97%   Weight: 49.4 kg     Height: 5' 2.75 (1.594 m)       Body mass index is 19.43 kg/m.   Physical Exam   Constitutional: Appears well-developed and well-nourished.  Psych: Affect appropriate to situation.  Eyes: No scleral injection.  HENT: No OP obstruction.  Head: Normocephalic.  Cardiovascular: Normal rate and regular rhythm.  Respiratory: Effort normal, non-labored breathing.  GI: Soft.  No distension. There is no tenderness.  Skin: WDI.   Neurologic Examination  Awake alert oriented x 2. Told me her age to be 66 when she is 89 years old. Told me the month November correctly No dysarthria No aphasia Cranial nerves II to XII intact Motor examination with no drift Sensation intact to light touch Coordination examination reveals no dysmetria Gait testing deferred  Labs/Imaging/Neurodiagnostic studies   CBC:  Recent Labs  Lab 04-05-24 1325  WBC 10.2  HGB 12.0  HCT 35.8*  MCV  94.7  PLT 245   Basic Metabolic Panel:  Lab Results  Component Value Date   NA 138 05-Apr-2024   K 3.6 2024/04/05   CO2 26 2024-04-05   GLUCOSE 147 (H) 2024-04-05   BUN 20 April 05, 2024   CREATININE 1.21 (H) 04-05-2024   CALCIUM  9.1 04-05-24   GFRNONAA 42 (L) 2024-04-05   GFRAA 41 (L) 05/21/2020   Lipid Panel:  Lab Results  Component Value Date   LDLCALC 34 02/21/2023   HgbA1c:  Lab Results  Component Value Date   HGBA1C 8.2 (H) 10/12/2023   Urine Drug Screen:     Component Value Date/Time   LABOPIA NONE DETECTED 07/29/2012 1238   COCAINSCRNUR NONE DETECTED 07/29/2012 1238   LABBENZ NONE DETECTED 07/29/2012 1238   AMPHETMU NONE DETECTED 07/29/2012 1238   THCU NONE DETECTED 07/29/2012 1238   LABBARB NONE DETECTED 07/29/2012 1238    Alcohol Level     Component Value Date/Time   ETH <11 07/29/2012 1127   INR  Lab Results  Component Value Date   INR 1.0 10/12/2023   APTT  Lab Results  Component Value Date   APTT 31 11/16/2011   Imaging personally reviewed  MR brain without contrast: 2 mm focus of acute infarct in the left parietal lobe.  Moderate chronic microvascular ischemic changes.  Remote infarcts in the right frontal periventricular white matter, right thalamus, and chronic microhemorrhage in the left cerebellum similar to prior.  CT angiography head and neck 12/29/2023-no acute intracranial hemorrhage or demarcated infarct.  No proximal high-grade stenosis in the head.  No hemodynamically significant stenosis in the neck  2D echocardiogram 12/30/2023 - LVEF greater than 70% -  Left atrium size normal -Aortic valve with no evidence of stenosis. -Mitral valve with no evidence of stenosis.  Moderate annular calcification visualized. -Agitated saline study negative.  LDL 12/29/2023: 45 A1c 12/29/2023: 6.5   ASSESSMENT   [name] is a 89 y.o. female past medical history of diabetes, hypertension, TIA, brought in for evaluation of altered mental status and  noted to have a punctate stroke in the left parietal area on MRI. No family at bedside for me to gather firsthand information but chart review was done utilizing H&P and Care Everywhere. She has had a TIA in September with left-sided weakness and altered mental status with workup revealing LDL at goal, A1c at goal under 7 and a 2D echocardiogram that also was not very concerning.  Her CT angiography head and neck was done on 12/29/2023 that did not show any proximal high-grade stenosis in the head and no hemodynamically significant stenosis in the neck. At this time, I think she is adequately risk factor controlled but the question looking at this stroke remains as to the etiology. Her stroke risk factor workup was completed about 4 months ago.  I do not think repeating a CT angiography head and neck and a 2D echocardiogram will add any additional value. She will definitely benefit from prolonged cardiac monitoring to look for any arrhythmia as a source of stroke and the prior TIA.  Impression: Acute ischemic stroke-punctate, likely incidental but needs further workup to look for embolic etiology  RECOMMENDATIONS  Frequent neurochecks Telemetry Therapy assessments Aspirin  81+ Plavix  78 for 21 days followed by aspirin  only LDL at goal-continue home rosuvastatin  10 mg daily I do not recommend repeating echocardiogram or CT angiography at this time. I would recommend normotension and avoidance of hypotension.  No need for permissive hypertension Outpatient 30-day cardiac monitor Follow-up with outpatient neurology in 8 to 12 weeks Stroke team will be available with questions as needed.  Please call with questions. Plan relayed to Dr. Trixie ______________________________________________________________________    Signed, Eligio Lav, MD [facility]

## 2024-03-19 NOTE — Discharge Summary (Signed)
 Physician Discharge Summary  [name] FMW:[id number] DOB: 14-Mar-1930 DOA: 03/18/2024  PCP: [name], NP  Admit date: 03/18/2024 Discharge date: 03/19/2024  Admitted From: home Disposition:  home  Recommendations for Outpatient Follow-up:  Follow up with PCP in 1-2 weeks  Home Health: PT, OT Equipment/Devices: none  Discharge Condition: stable CODE STATUS: Full code Diet Orders (From admission, onward)     Start     Ordered   03/19/24 0912  Diet regular Fluid consistency: Thin  Diet effective now       Question:  Fluid consistency:  Answer:  Thin   03/19/24 0912            Brief Narrative / Interim history:  [name] is a 89 y.o. female with medical history significant of DM2, HTN, HLD, comes to the hospital with complaints of altered mental status and increased weakness.  Family reports that she was confused the day prior to admission, went to bed early which is abnormal for her.  She woke up with worsening confusion and weakness the next morning and she was brought to the hospital.  Her cause for her weakness was not initially clear but an MRI showed an acute CVA and neurology consulted  Hospital Course / Discharge diagnoses: Principal problem Acute CVA -MRI on admission showed a 2 mm focus of acute infarct in the left parietal lobe, along with chronic ischemic changes and remote infarcts.  Neurology consulted, evaluated patient while hospitalized.  Patient had a recent admission just 2 months ago in [place] with a TIA, and completed full evaluation there.  Head and neck CTA did not show any proximal high-grade stenosis.  Underwent a 2D echocardiogram which showed LVEF more than 70%, mild aortic valve calcification, no shunt by agitated saline.  She was discharged home aspirin  and Plavix  for 21-day and then aspirin  alone, was also recommended to wear a 30-day Holter monitor, however that was never set up for patient's son.  Discussed with neurology, given  recent workup it was not recommended to repeat echo or CT angiogram, and to follow-up on ensuring that she does get a 30-day cardiac monitor.  She was recommended to be on aspirin  and Plavix  for 21 days followed by aspirin  alone.  Neurology also recommended a 30-day Holter monitor, cardiology was messaged and they will mail a monitor to the patient  Active problems Acute metabolic encephalopathy-possibly in the setting of #1 but less likely, also hospital induced delirium, now clear, at baseline per son Essential hypertension-continue home medications, keep normotensive as recommended by neurology  CKD 3A-baseline creatinine 1.1-1.4, currently at baseline Recurrent UTIs-urinalysis unremarkable.  Continue suppressive Keflex  as prescribed by urology Hyperlipidemia-continue statin Chronic pain-continue home tramadol  Insulin -dependent diabetes mellitus -A1c 8.2 which is appropriate in her age group.   Sepsis ruled out   Discharge Instructions   Allergies as of 03/19/2024       Reactions   Ciprofloxacin  Other (See Comments)   Dizziness   Morphine And Codeine Other (See Comments)   fuzzy feeling        Medication List     STOP taking these medications    estradiol 0.01 % Crea vaginal cream Commonly known as: ESTRACE       TAKE these medications    acetaminophen  325 MG tablet Commonly known as: TYLENOL  Take 650 mg by mouth every 6 (six) hours as needed for moderate pain (pain score 4-6).   aspirin  EC 81 MG tablet Take 81 mg by mouth daily.  Swallow whole.   AZO Cranberry 250-30 MG Tabs Take 1 tablet by mouth every morning.   Basaglar  KwikPen 100 UNIT/ML Inject 15 Units into the skin at bedtime.   bisoprolol  5 MG tablet Commonly known as: ZEBETA  Take 2.5 mg by mouth daily.   Centrum Silver 50+Women Tabs Take 1 tablet by mouth daily.   cephALEXin  500 MG capsule Commonly known as: KEFLEX  Take 500 mg by mouth 2 (two) times daily.   clopidogrel  75 MG  tablet Commonly known as: PLAVIX  Take 1 tablet (75 mg total) by mouth daily for 21 days. Start taking on: March 20, 2024   CRANBERRY PO Take 1 capsule by mouth daily.   melatonin 5 MG Tabs Take 5 mg by mouth at bedtime.   metFORMIN  1000 MG tablet Commonly known as: GLUCOPHAGE  Take 1,000 mg by mouth 2 (two) times daily.   rosuvastatin  10 MG tablet Commonly known as: CRESTOR  Take 10 mg by mouth daily.   traMADol  50 MG tablet Commonly known as: ULTRAM  Take 50 mg by mouth every 6 (six) hours as needed for moderate pain. Pt only takes 2 pills per night        Consultations: Neurology  Procedures/Studies:  MR BRAIN WO CONTRAST Result Date: 03/18/2024 EXAM: MRI BRAIN WITHOUT CONTRAST 03/18/2024 06:36:00 PM TECHNIQUE: Multiplanar multisequence MRI of the head/brain was performed without the administration of intravenous contrast. COMPARISON: Same day CT head and MRI head 12/10/2021. CLINICAL HISTORY: Mental status change, unknown cause. FINDINGS: BRAIN AND VENTRICLES: There is a 2 mm focus of diffusion signal abnormality in the left parietal lobe. There are prominent areas of T2 and FLAIR hyperintensity throughout the periventricular and subcortical white matter suggestive of moderate chronic microvascular ischemic changes. 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 Additional remote lacunar infarct in the right thalamus with associated area of chronic microhemorrhage. Additional signal abnormality in the pons related to chronic microvascular ischemic changes. Chronic microhemorrhage in the left external capsule. Small remote infarcts in the left cerebellum which are similar to prior bilateral lens replacements. There is mild age related volume loss. No mass. No midline shift. No hydrocephalus. The sella is unremarkable. Normal flow voids. ORBITS: No acute abnormality. SINUSES AND MASTOIDS: No acute abnormality. BONES AND SOFT TISSUES: Normal marrow signal. No  acute soft tissue abnormality. IMPRESSION: 1. 2 mm focus of acute infarct in the left parietal lobe. 2. Moderate chronic microvascular ischemic changes. 3. Remote infarcts in the right frontal periventricular white matter, right thalamus with associated chronic microhemorrhage, and left cerebellum, similar to prior. Electronically signed by: Donnice Mania MD 03/18/2024 07:43 PM EST RP Workstation: [id number]   DG Chest Portable 1 View Result Date: 03/18/2024 CLINICAL DATA:  Confusion EXAM: PORTABLE CHEST 1 VIEW COMPARISON:  10/12/2023 FINDINGS: The heart size and mediastinal contours are within normal limits. Both lungs are clear. The visualized skeletal structures are unremarkable. IMPRESSION: No active disease. Electronically Signed   By: Ozell Daring M.D.   On: 03/18/2024 16:53   CT Head Wo Contrast Result Date: 03/18/2024 CLINICAL DATA:  Mental status change, altered mental status. EXAM: CT HEAD WITHOUT CONTRAST TECHNIQUE: Contiguous axial images were obtained from the base of the skull through the vertex without intravenous contrast. RADIATION DOSE REDUCTION: This exam was performed according to the departmental dose-optimization program which includes automated exposure control, adjustment of the mA and/or kV according to patient size and/or use of iterative reconstruction technique. COMPARISON:  MRI head 12/10/2021. FINDINGS: Brain: No acute intracranial hemorrhage. Remote infarct in  the right frontal periventricular white matter similar appearance of moderate chronic microvascular ischemic changes. No edema, mass effect, or midline shift. The basilar cisterns are patent. Ventricles: Prominence of the ventricles suggesting underlying parenchymal volume loss. Vascular: Atherosclerosis of the carotid siphons and intracranial vertebral arteries. Skull: No acute or aggressive finding. Orbits: Bilateral lens replacement. Sinuses: The visualized paranasal sinuses are clear. Other: Mastoid air cells are  clear. IMPRESSION: No CT evidence of acute intracranial abnormality. Moderate chronic microvascular ischemic changes. 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 Electronically Signed   By: Donnice Mania M.D.   On: 03/18/2024 16:41     Subjective: - no chest pain, shortness of breath, no abdominal pain, nausea or vomiting.   Discharge Exam: BP (!) 152/73 (BP Location: Right Arm)   Pulse 71   Temp 97.9 F (36.6 C) (Axillary)   Resp 18   Ht 5' 2.75 (1.594 m)   Wt 49.4 kg   SpO2 100%   BMI 19.43 kg/m   General: Pt is alert, awake, not in acute distress Cardiovascular: RRR, S1/S2 +, no rubs, no gallops Respiratory: CTA bilaterally, no wheezing, no rhonchi Abdominal: Soft, NT, ND, bowel sounds + Extremities: no edema, no cyanosis    The results of significant diagnostics from this hospitalization (including imaging, microbiology, ancillary and laboratory) are listed below for reference.     Microbiology: Recent Results (from the past 240 hours)  Resp panel by RT-PCR (RSV, Flu A&B, Covid) Anterior Nasal Swab     Status: None   Collection Time: 03/18/24  4:49 PM   Specimen: Anterior Nasal Swab  Result Value Ref Range Status   SARS Coronavirus 2 by RT PCR NEGATIVE NEGATIVE Final   Influenza A by PCR NEGATIVE NEGATIVE Final   Influenza B by PCR NEGATIVE NEGATIVE Final    Comment: (NOTE) The Xpert Xpress SARS-CoV-2/FLU/RSV plus assay is intended as an aid in the diagnosis of influenza from Nasopharyngeal swab specimens and should not be used as a sole basis for treatment. Nasal washings and aspirates are unacceptable for Xpert Xpress SARS-CoV-2/FLU/RSV testing.  Fact Sheet for Patients: bloggercourse.com  Fact Sheet for Healthcare Providers: seriousbroker.it  This test is not yet approved or cleared by the [place]  FDA and has been authorized for detection and/or diagnosis of SARS-CoV-2 by FDA  under an Emergency Use Authorization (EUA). This EUA will remain in effect (meaning this test can be used) for the duration of the COVID-19 declaration under Section 564(b)(1) of the Act, 21 U.S.C. section 360bbb-3(b)(1), unless the authorization is terminated or revoked.     Resp Syncytial Virus by PCR NEGATIVE NEGATIVE Final    Comment: (NOTE) Fact Sheet for Patients: bloggercourse.com  Fact Sheet for Healthcare Providers: seriousbroker.it  This test is not yet approved or cleared by the [place]  FDA and has been authorized for detection and/or diagnosis of SARS-CoV-2 by FDA under an Emergency Use Authorization (EUA). This EUA will remain in effect (meaning this test can be used) for the duration of the COVID-19 declaration under Section 564(b)(1) of the Act, 21 U.S.C. section 360bbb-3(b)(1), unless the authorization is terminated or revoked.  Performed at [facility], [address] [place], [place] [address]      Labs: Basic Metabolic Panel: Recent Labs  Lab 03/18/24 1325  NA 138  K 3.6  CL 101  CO2 26  GLUCOSE 147*  BUN 20  CREATININE 1.21*  CALCIUM  9.1   Liver Function Tests: Recent Labs  Lab 03/18/24  1325  AST 20  ALT 12  ALKPHOS 40  BILITOT 0.8  PROT 7.0  ALBUMIN 3.6   CBC: Recent Labs  Lab 03/18/24 1325  WBC 10.2  HGB 12.0  HCT 35.8*  MCV 94.7  PLT 245   CBG: Recent Labs  Lab 03/18/24 1319 03/18/24 2156 03/19/24 0011 03/19/24 0826 03/19/24 1411  GLUCAP 149* 283* 208* 173* 187*   Hgb A1c No results for input(s): HGBA1C in the last 72 hours. Lipid Profile No results for input(s): CHOL, HDL, LDLCALC, TRIG, CHOLHDL, LDLDIRECT in the last 72 hours. Thyroid  function studies No results for input(s): TSH, T4TOTAL, T3FREE, THYROIDAB in the last 72 hours.  Invalid input(s): FREET3 Urinalysis    Component Value Date/Time   COLORURINE YELLOW 03/18/2024  1630   APPEARANCEUR CLEAR 03/18/2024 1630   LABSPEC 1.010 03/18/2024 1630   PHURINE 6.0 03/18/2024 1630   GLUCOSEU 50 (A) 03/18/2024 1630   HGBUR NEGATIVE 03/18/2024 1630   BILIRUBINUR SMALL (A) 03/18/2024 1630   KETONESUR 5 (A) 03/18/2024 1630   PROTEINUR 30 (A) 03/18/2024 1630   UROBILINOGEN 0.2 07/29/2012 1237   NITRITE NEGATIVE 03/18/2024 1630   LEUKOCYTESUR NEGATIVE 03/18/2024 1630    FURTHER DISCHARGE INSTRUCTIONS:   Get Medicines reviewed and adjusted: Please take all your medications with you for your next visit with your Primary MD   Laboratory/radiological data: Please request your Primary MD to go over all hospital tests and procedure/radiological results at the follow up, please ask your Primary MD to get all Hospital records sent to his/her office.   In some cases, they will be blood work, cultures and biopsy results pending at the time of your discharge. Please request that your primary care M.D. goes through all the records of your hospital data and follows up on these results.   Also Note the following: If you experience worsening of your admission symptoms, develop shortness of breath, life threatening emergency, suicidal or homicidal thoughts you must seek medical attention immediately by calling 911 or calling your MD immediately  if symptoms less severe.   You must read complete instructions/literature along with all the possible adverse reactions/side effects for all the Medicines you take and that have been prescribed to you. Take any new Medicines after you have completely understood and accpet all the possible adverse reactions/side effects.    Do not drive when taking Pain medications or sleeping medications (Benzodaizepines)   Do not take more than prescribed Pain, Sleep and Anxiety Medications. It is not advisable to combine anxiety,sleep and pain medications without talking with your primary care practitioner   Special Instructions: If you have smoked or  chewed Tobacco  in the last 2 yrs please stop smoking, stop any regular Alcohol  and or any Recreational drug use.   Wear Seat belts while driving.   Please note: You were cared for by a hospitalist during your hospital stay. Once you are discharged, your primary care physician will handle any further medical issues. Please note that NO REFILLS for any discharge medications will be authorized once you are discharged, as it is imperative that you return to your primary care physician (or establish a relationship with a primary care physician if you do not have one) for your post hospital discharge needs so that they can reassess your need for medications and monitor your lab values.  Time coordinating discharge: 35 minutes  SIGNED:  Nilda Fendt, MD, PhD 03/19/2024, 3:04 PM

## 2024-03-19 NOTE — Progress Notes (Signed)
 Ordering 30 day monitor post stroke. Results to Dr. Lonni

## 2024-03-19 NOTE — TOC Progression Note (Signed)
 Transition of Care [facility]) - Progression Note    Patient Details  Name: [name] MRN: [id number] Date of Birth: [birth year]  [facility] CM/SW Contact  Nola Devere Hands, RN Phone Number: 03/19/2024, 3:07 PM  Clinical Narrative:    Pt is a 89y.o. female admitted on 03/18/24 from home with AMS.  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 Case Manager spoke with patient's son, Zollie Clemence concerning request to arrange for Home Health services rather than SNF. He states that patient was active with [facility] previously and wants to use them now. CM contacted Artavia, Liaison with [facility], confirmed they have serviced her recently and will accept now. Due to the Thanksgiving Holiday start of Care will not be until Friday,03/22/24 or Saturday 03/23/24. CM shard this with MD and with patient's son. She will have family support at discharge.                     Expected Discharge Plan and Services         Expected Discharge Date: 03/19/24                                     Social Drivers of Health (SDOH) Interventions SDOH Screenings   Food Insecurity: No Food Insecurity (03/19/2024)  Housing: Low Risk  (03/19/2024)  Transportation Needs: No Transportation Needs (03/19/2024)  Utilities: Not At Risk (03/19/2024)  Depression (PHQ2-9): Low Risk  (09/22/2022)  Financial Resource Strain: Low Risk (12/29/2023)   Received from [facility]  Social Connections: Moderately Integrated (03/19/2024)  Tobacco Use: Low Risk  (03/19/2024)    Readmission Risk Interventions    11/03/2022    1:11 PM  Readmission Risk Prevention Plan  Post Dischage Appt Complete  Medication Screening Complete  Transportation Screening Complete

## 2024-03-29 ENCOUNTER — Emergency Department (HOSPITAL_COMMUNITY)

## 2024-03-29 ENCOUNTER — Other Ambulatory Visit: Payer: Self-pay

## 2024-03-29 ENCOUNTER — Emergency Department (HOSPITAL_COMMUNITY)
Admission: EM | Admit: 2024-03-29 | Discharge: 2024-03-29 | Disposition: A | Discharge status: Home / Self Care | Attending: Emergency Medicine | Admitting: Emergency Medicine

## 2024-03-29 DIAGNOSIS — Z7901 Long term (current) use of anticoagulants: Secondary | ICD-10-CM | POA: Insufficient documentation

## 2024-03-29 DIAGNOSIS — Z79899 Other long term (current) drug therapy: Secondary | ICD-10-CM | POA: Insufficient documentation

## 2024-03-29 DIAGNOSIS — I10 Essential (primary) hypertension: Secondary | ICD-10-CM | POA: Insufficient documentation

## 2024-03-29 DIAGNOSIS — N390 Urinary tract infection, site not specified: Secondary | ICD-10-CM | POA: Insufficient documentation

## 2024-03-29 DIAGNOSIS — E119 Type 2 diabetes mellitus without complications: Secondary | ICD-10-CM | POA: Insufficient documentation

## 2024-03-29 DIAGNOSIS — R4182 Altered mental status, unspecified: Secondary | ICD-10-CM | POA: Insufficient documentation

## 2024-03-29 DIAGNOSIS — Z7984 Long term (current) use of oral hypoglycemic drugs: Secondary | ICD-10-CM | POA: Insufficient documentation

## 2024-03-29 LAB — BASIC METABOLIC PANEL WITH GFR
Anion gap: 16 — ABNORMAL HIGH (ref 5–15)
BUN: 29 mg/dL — ABNORMAL HIGH (ref 8–23)
CO2: 26 mmol/L (ref 22–32)
Calcium: 10.4 mg/dL — ABNORMAL HIGH (ref 8.9–10.3)
Chloride: 98 mmol/L (ref 98–111)
Creatinine, Ser: 1.47 mg/dL — ABNORMAL HIGH (ref 0.44–1.00)
GFR, Estimated: 33 mL/min — ABNORMAL LOW (ref >60)
Glucose, Bld: 230 mg/dL — ABNORMAL HIGH (ref 70–99)
Potassium: 4.3 mmol/L (ref 3.5–5.1)
Sodium: 140 mmol/L (ref 135–145)

## 2024-03-29 LAB — CBC WITH DIFFERENTIAL/PLATELET
Abs Immature Granulocytes: 0.03 K/uL (ref 0.00–0.07)
Basophils Absolute: 0.1 K/uL (ref 0.0–0.1)
Basophils Relative: 1 %
Eosinophils Absolute: 0.2 K/uL (ref 0.0–0.5)
Eosinophils Relative: 2 %
HCT: 37.5 % (ref 36.0–46.0)
Hemoglobin: 12.5 g/dL (ref 12.0–15.0)
Immature Granulocytes: 0 %
Lymphocytes Relative: 29 %
Lymphs Abs: 3.2 K/uL (ref 0.7–4.0)
MCH: 31.8 pg (ref 26.0–34.0)
MCHC: 33.3 g/dL (ref 30.0–36.0)
MCV: 95.4 fL (ref 80.0–100.0)
Monocytes Absolute: 1.1 K/uL — ABNORMAL HIGH (ref 0.1–1.0)
Monocytes Relative: 10 %
Neutro Abs: 6.4 K/uL (ref 1.7–7.7)
Neutrophils Relative %: 58 %
Platelets: 274 K/uL (ref 150–400)
RBC: 3.93 MIL/uL (ref 3.87–5.11)
RDW: 13.2 % (ref 11.5–15.5)
WBC: 11 K/uL — ABNORMAL HIGH (ref 4.0–10.5)
nRBC: 0 % (ref 0.0–0.2)

## 2024-03-29 LAB — URINALYSIS, ROUTINE W REFLEX MICROSCOPIC
Bilirubin Urine: NEGATIVE
Glucose, UA: 50 mg/dL — AB
Hgb urine dipstick: NEGATIVE
Ketones, ur: NEGATIVE mg/dL
Nitrite: NEGATIVE
Protein, ur: NEGATIVE mg/dL
Specific Gravity, Urine: 1.004 — ABNORMAL LOW (ref 1.005–1.030)
pH: 6 (ref 5.0–8.0)

## 2024-03-29 LAB — TROPONIN T, HIGH SENSITIVITY
Troponin T High Sensitivity: 28 ng/L — ABNORMAL HIGH (ref 0–19)
Troponin T High Sensitivity: 24 ng/L — ABNORMAL HIGH (ref 0–19)

## 2024-03-29 LAB — URINE DRUG SCREEN
Amphetamines: NEGATIVE
Barbiturates: NEGATIVE
Benzodiazepines: NEGATIVE
Cocaine: NEGATIVE
Fentanyl: NEGATIVE
Methadone Scn, Ur: NEGATIVE
Opiates: NEGATIVE
Tetrahydrocannabinol: NEGATIVE

## 2024-03-29 LAB — ETHANOL: Alcohol, Ethyl (B): <15 mg/dL (ref <15)

## 2024-03-29 MED ORDER — SULFAMETHOXAZOLE-TRIMETHOPRIM 800-160 MG PO TABS: 1.0000 | ORAL_TABLET | Freq: Two times a day (BID) | ORAL | 0 refills | Status: DC

## 2024-03-29 MED ORDER — SULFAMETHOXAZOLE-TRIMETHOPRIM 800-160 MG PO TABS
1.0000 | ORAL_TABLET | Freq: Once | ORAL | Status: AC
  Administered 2024-03-29: 1 via ORAL
  Filled 2024-03-29: qty 1

## 2024-03-29 NOTE — Discharge Instructions (Addendum)
 [name] is being treated for a possible urinary tract infection.  She was prescribed an antibiotic called Bactrim  to take for 1 week.  You can continue her other medications.  Her confusion symptoms should improve within the next 2 days, if the urine infection is the cause of her confusion.  However, if she is failing to improve, or she is having worsening confusion, please return to the hospital.

## 2024-03-29 NOTE — ED Triage Notes (Signed)
 PT ambulatory to triage accompanied by son, Arley. Pt reportedly began hallucinating last night. Pt was seeing deceased people, pets, etc. Pt is more confused from typical baseline. Hx of CVA and UTI and takes daily abx.

## 2024-03-29 NOTE — ED Provider Notes (Signed)
 [place] [facility] Provider Note   CSN: [id number] Arrival date & time: 03/29/24  1532     Patient presents with: Altered Mental Status   [name] is a 89 y.o. female here with AMS.  The patient's son at the bedside reports that she appears have been abruptly confused over the past 3 days.  He says that she is talking out of her head, appears to be hallucinating and talking to people that are not there.  He says this is a pretty abrupt change to her baseline.  She does have some very mild early dementia but is still pretty functionally independent otherwise.  She is on Keflex  daily for UTI prophylaxis.  He reports UTIs have made her confused in the past.  She has also been on tramadol  chronically for many years and takes 1 at night for general pain.  No other new or narcotic medications.  I did review external records and the patient was admitted to the hospital most recently 2 weeks ago for increased weakness and confusion, and had an MRI concerning for an acute infarct.  She was discharged on Plavix  and aspirin  at that time.  She also has a history of high blood pressure, type 2 diabetes and high cholesterol.   HPI     Prior to Admission medications   Medication Sig Start Date End Date Taking? Authorizing Provider  acetaminophen  (TYLENOL ) 325 MG tablet Take 325-650 mg by mouth every 6 (six) hours as needed (for pain or headaches).   Yes [provider]  bisoprolol  (ZEBETA ) 5 MG tablet Take 2.5 mg by mouth in the morning.   Yes [provider]  cephALEXin  (KEFLEX ) 250 MG capsule Take 250 mg by mouth daily with breakfast.   Yes [provider]  clopidogrel  (PLAVIX ) 75 MG tablet Take 1 tablet (75 mg total) by mouth daily for 21 days. Patient taking differently: Take 75 mg by mouth in the morning. 03/20/24 04/10/24 Yes Gherghe, Costin M, MD  CRANBERRY PO Take 1 tablet by mouth See admin instructions. Cranberry 500 mg +  bacillus sporogenes + calcium  + Vitamin C = Take 1 caplet by mouth at bedtime   Yes [provider]  D-MANNOSE PO Take 700 mg by mouth at bedtime.   Yes [provider]  Insulin  Glargine (BASAGLAR  KWIKPEN) 100 UNIT/ML Inject 5-8 Units into the skin at bedtime as needed (for a BGL of 200 or greater).   Yes [provider]  Melatonin 5 MG CHEW Chew 5 mg by mouth at bedtime.   Yes [provider]  metFORMIN  (GLUCOPHAGE ) 1000 MG tablet Take 1,000 mg by mouth 2 (two) times daily.   Yes [provider]  Multiple Vitamins-Minerals (CENTRUM SILVER WOMEN 50+ PO) Take 1 tablet by mouth daily with breakfast.   Yes [provider]  rosuvastatin  (CRESTOR ) 20 MG tablet Take 10 mg by mouth in the morning.   Yes [provider]  sulfamethoxazole -trimethoprim  (BACTRIM  DS) 800-160 MG tablet Take 1 tablet by mouth 2 (two) times daily for 7 days. 03/30/24 04/06/24 Yes Emmah Bratcher, Donnice PARAS, MD  traMADol  (ULTRAM ) 50 MG tablet Take 50 mg by mouth See admin instructions. Take 50 mg by mouth at bedtime and an additional 50 mg up to two times a day as needed for pain   Yes [provider]    Allergies: Ciprofloxacin  and Morphine and codeine    Review of Systems  Updated Vital Signs BP (!) 175/72 (  BP Location: Right Arm)   Pulse 70   Temp 98 F (36.7 C) (Oral)   Resp 17   SpO2 100%   Physical Exam Constitutional:      General: She is not in acute distress. HENT:     Head: Normocephalic and atraumatic.  Eyes:     Conjunctiva/sclera: Conjunctivae normal.     Pupils: Pupils are equal, round, and reactive to light.  Cardiovascular:     Rate and Rhythm: Normal rate and regular rhythm.  Pulmonary:     Effort: Pulmonary effort is normal. No respiratory distress.  Abdominal:     General: There is no distension.     Tenderness: There is no abdominal tenderness.  Skin:    General: Skin is warm and dry.  Neurological:     General: No focal  deficit present.     Mental Status: She is alert. Mental status is at baseline.  Psychiatric:     Comments: Patient appears pleasantly demented and is speaking somewhat nonsensically, and stopping mid conversation     (all labs ordered are listed, but only abnormal results are displayed) Labs Reviewed  BASIC METABOLIC PANEL WITH GFR - Abnormal; Notable for the following components:      Result Value   Glucose, Bld 230 (*)    BUN 29 (*)    Creatinine, Ser 1.47 (*)    Calcium  10.4 (*)    GFR, Estimated 33 (*)    Anion gap 16 (*)    All other components within normal limits  CBC WITH DIFFERENTIAL/PLATELET - Abnormal; Notable for the following components:   WBC 11.0 (*)    Monocytes Absolute 1.1 (*)    All other components within normal limits  URINALYSIS, ROUTINE W REFLEX MICROSCOPIC - Abnormal; Notable for the following components:   Color, Urine STRAW (*)    Specific Gravity, Urine 1.004 (*)    Glucose, UA 50 (*)    Leukocytes,Ua MODERATE (*)    Bacteria, UA RARE (*)    All other components within normal limits  TROPONIN T, HIGH SENSITIVITY - Abnormal; Notable for the following components:   Troponin T High Sensitivity 28 (*)    All other components within normal limits  URINE CULTURE  URINE DRUG SCREEN  ETHANOL  TROPONIN T, HIGH SENSITIVITY    EKG: EKG Interpretation Date/Time:  Friday March 29 2024 17:18:29 EST Ventricular Rate:  76 PR Interval:  178 QRS Duration:  91 QT Interval:  394 QTC Calculation: 443 R Axis:   61  Text Interpretation: Sinus rhythm Consider anterior infarct ST elevation, consider inferior injury ST elevations seen on prior tracing Confirmed by Cottie Cough [phone]) on 03/29/2024 5:55:30 PM  Radiology: CT Head Wo Contrast Result Date: 03/29/2024 EXAM: CT HEAD WITHOUT CONTRAST 03/29/2024 06:20:27 PM TECHNIQUE: CT of the head was performed without the administration of intravenous contrast. Automated exposure control, iterative  reconstruction, and/or weight based adjustment of the mA/kV was utilized to reduce the radiation dose to as low as reasonably achievable. COMPARISON: CT head without contrast 03/18/2024. CLINICAL HISTORY: Mental status change, unknown cause. FINDINGS: BRAIN AND VENTRICLES: No acute hemorrhage. No evidence of acute infarct. No hydrocephalus. No extra-axial collection. No mass effect or midline shift. Mild atrophy and advanced white matter changes are stable. Remote lacunar infarct present within the left paramedian lower midbrain. Atherosclerotic calcifications are present in the cavernous carotid arteries bilaterally and at the dural margin of both vertebral arteries. No hyperdense vessel is present. ORBITS: No acute abnormality. SINUSES:  No acute abnormality. SOFT TISSUES AND SKULL: No acute soft tissue abnormality. No skull fracture. IMPRESSION: 1. No acute intracranial abnormality. 2. Stable mild atrophy and advanced white matter changes. 3. Remote lacunar infarct in the left paramedian lower midbrain. 4. Atherosclerotic calcifications in the cavernous carotid and vertebral arteries. Electronically signed by: Lonni Necessary MD 03/29/2024 06:33 PM EST RP Workstation: [id number]     Procedures   Medications Ordered in the ED  sulfamethoxazole -trimethoprim  (BACTRIM  DS) 800-160 MG per tablet 1 tablet (has no administration in time range)    Clinical Course as of 03/29/24 1934  Fri Mar 29, 2024  1806 Notified by MRI technician they can not complete MRI brain tonight due to staffing shortage.  CT head ordered [MT]    Clinical Course User Index [MT] Megham Dwyer, Donnice PARAS, MD                                 Medical Decision Making Amount and/or Complexity of Data Reviewed Labs: ordered. Radiology: ordered. ECG/medicine tests: ordered.  Risk Prescription drug management.   This patient presents to the Emergency Department with complaint of altered mental status.  This involves an extensive  number of treatment options, and is a complaint that carries with it a high risk of complications and morbidity.  The differential diagnosis includes hypoglycemia vs metabolic encephalopathy vs infection (including cystitis) vs ICH vs stroke vs polypharmacy vs other  I ordered, reviewed, and interpreted labs, including UA with leukocytes, possible UTI.  Urine culture will be sent.  Remainder of labs are unremarkable.  Very minor troponin elevation is likely normal for age, doubt ACS I ordered medication for suspected UTI I ordered imaging studies which included CT scan of the head I independently visualized and interpreted imaging which showed no emergent findings and the monitor tracing which showed acute heart rate Additional history was obtained from the patient's son at bedside Previous records obtained and reviewed showing hospital discharge summary I personally reviewed the patients ECG which showed sinus rhythm with no acute ischemic findings  We do not have MRI readily available tonight.  However I think this presentation with confusion and some hallucinations to be more consistent with urinary tract infection then with a stroke.  We discussed switching her antibiotics.  She is allergic to ciprofloxacin  and already taken cephalosporins at home for chronic prophylaxis.  Therefore we will try Bactrim .  Her son has a strong preference to take her home tonight, concerned about sundowning in the hospital, and I think this is reasonable.  However symptoms are not improving over the next 2 days, or he has any other concerns, he knows to bring her back to the hospital.       Final diagnoses:  Altered mental status, unspecified altered mental status type  Urinary tract infection without hematuria, site unspecified  Hypertension, unspecified type    ED Discharge Orders          Ordered    sulfamethoxazole -trimethoprim  (BACTRIM  DS) 800-160 MG tablet  2 times daily        03/29/24 1933                Cottie Donnice PARAS, MD 03/29/24 [phone]

## 2024-03-30 LAB — URINE CULTURE: Culture: <10000 — AB

## 2024-04-04 ENCOUNTER — Emergency Department (HOSPITAL_COMMUNITY)

## 2024-04-04 ENCOUNTER — Inpatient Hospital Stay (HOSPITAL_COMMUNITY)
Admission: EM | Admit: 2024-04-04 | Discharge: 2024-04-12 | DRG: 682 | Disposition: A | Discharge status: Skilled Nursing Facility | Attending: Internal Medicine | Admitting: Internal Medicine

## 2024-04-04 ENCOUNTER — Encounter (HOSPITAL_COMMUNITY): Payer: Self-pay

## 2024-04-04 DIAGNOSIS — E7800 Pure hypercholesterolemia, unspecified: Secondary | ICD-10-CM | POA: Diagnosis present

## 2024-04-04 DIAGNOSIS — I129 Hypertensive chronic kidney disease with stage 1 through stage 4 chronic kidney disease, or unspecified chronic kidney disease: Secondary | ICD-10-CM | POA: Diagnosis present

## 2024-04-04 DIAGNOSIS — Z751 Person awaiting admission to adequate facility elsewhere: Secondary | ICD-10-CM

## 2024-04-04 DIAGNOSIS — Z90710 Acquired absence of both cervix and uterus: Secondary | ICD-10-CM

## 2024-04-04 DIAGNOSIS — Z681 Body mass index (BMI) 19 or less, adult: Secondary | ICD-10-CM

## 2024-04-04 DIAGNOSIS — Z833 Family history of diabetes mellitus: Secondary | ICD-10-CM

## 2024-04-04 DIAGNOSIS — Z79899 Other long term (current) drug therapy: Secondary | ICD-10-CM

## 2024-04-04 DIAGNOSIS — Z7902 Long term (current) use of antithrombotics/antiplatelets: Secondary | ICD-10-CM

## 2024-04-04 DIAGNOSIS — Z881 Allergy status to other antibiotic agents status: Secondary | ICD-10-CM

## 2024-04-04 DIAGNOSIS — E1165 Type 2 diabetes mellitus with hyperglycemia: Secondary | ICD-10-CM | POA: Diagnosis present

## 2024-04-04 DIAGNOSIS — Z8673 Personal history of transient ischemic attack (TIA), and cerebral infarction without residual deficits: Secondary | ICD-10-CM

## 2024-04-04 DIAGNOSIS — Z7189 Other specified counseling: Secondary | ICD-10-CM

## 2024-04-04 DIAGNOSIS — S7002XA Contusion of left hip, initial encounter: Secondary | ICD-10-CM | POA: Diagnosis not present

## 2024-04-04 DIAGNOSIS — R441 Visual hallucinations: Secondary | ICD-10-CM | POA: Diagnosis present

## 2024-04-04 DIAGNOSIS — E860 Dehydration: Secondary | ICD-10-CM | POA: Diagnosis present

## 2024-04-04 DIAGNOSIS — Z9181 History of falling: Secondary | ICD-10-CM

## 2024-04-04 DIAGNOSIS — Z66 Do not resuscitate: Secondary | ICD-10-CM | POA: Diagnosis present

## 2024-04-04 DIAGNOSIS — R739 Hyperglycemia, unspecified: Principal | ICD-10-CM

## 2024-04-04 DIAGNOSIS — E440 Moderate protein-calorie malnutrition: Secondary | ICD-10-CM | POA: Diagnosis present

## 2024-04-04 DIAGNOSIS — N179 Acute kidney failure, unspecified: Secondary | ICD-10-CM | POA: Diagnosis not present

## 2024-04-04 DIAGNOSIS — R627 Adult failure to thrive: Secondary | ICD-10-CM | POA: Diagnosis present

## 2024-04-04 DIAGNOSIS — R296 Repeated falls: Secondary | ICD-10-CM | POA: Diagnosis present

## 2024-04-04 DIAGNOSIS — Z981 Arthrodesis status: Secondary | ICD-10-CM

## 2024-04-04 DIAGNOSIS — D62 Acute posthemorrhagic anemia: Secondary | ICD-10-CM | POA: Diagnosis not present

## 2024-04-04 DIAGNOSIS — G9341 Metabolic encephalopathy: Secondary | ICD-10-CM | POA: Diagnosis present

## 2024-04-04 DIAGNOSIS — E1122 Type 2 diabetes mellitus with diabetic chronic kidney disease: Secondary | ICD-10-CM | POA: Diagnosis present

## 2024-04-04 DIAGNOSIS — E118 Type 2 diabetes mellitus with unspecified complications: Secondary | ICD-10-CM

## 2024-04-04 DIAGNOSIS — I700 Atherosclerosis of aorta: Secondary | ICD-10-CM | POA: Diagnosis present

## 2024-04-04 DIAGNOSIS — W06XXXA Fall from bed, initial encounter: Secondary | ICD-10-CM | POA: Diagnosis not present

## 2024-04-04 DIAGNOSIS — N1832 Chronic kidney disease, stage 3b: Secondary | ICD-10-CM | POA: Diagnosis present

## 2024-04-04 DIAGNOSIS — I21A1 Myocardial infarction type 2: Secondary | ICD-10-CM | POA: Diagnosis present

## 2024-04-04 DIAGNOSIS — Z885 Allergy status to narcotic agent status: Secondary | ICD-10-CM

## 2024-04-04 DIAGNOSIS — I10 Essential (primary) hypertension: Secondary | ICD-10-CM | POA: Diagnosis present

## 2024-04-04 DIAGNOSIS — Z87440 Personal history of urinary (tract) infections: Secondary | ICD-10-CM

## 2024-04-04 DIAGNOSIS — S0003XA Contusion of scalp, initial encounter: Secondary | ICD-10-CM | POA: Diagnosis not present

## 2024-04-04 DIAGNOSIS — Z7984 Long term (current) use of oral hypoglycemic drugs: Secondary | ICD-10-CM

## 2024-04-04 DIAGNOSIS — E1140 Type 2 diabetes mellitus with diabetic neuropathy, unspecified: Secondary | ICD-10-CM | POA: Diagnosis present

## 2024-04-04 LAB — URINALYSIS, W/ REFLEX TO CULTURE (INFECTION SUSPECTED)
Bacteria, UA: NONE SEEN
Bilirubin Urine: NEGATIVE
Glucose, UA: 150 mg/dL — AB
Ketones, ur: NEGATIVE mg/dL
Leukocytes,Ua: NEGATIVE
Nitrite: NEGATIVE
Protein, ur: 100 mg/dL — AB
Specific Gravity, Urine: 1.014 (ref 1.005–1.030)
pH: 5 (ref 5.0–8.0)

## 2024-04-04 LAB — COMPREHENSIVE METABOLIC PANEL WITH GFR
ALT: 12 U/L (ref 0–44)
AST: 26 U/L (ref 15–41)
Albumin: 3.7 g/dL (ref 3.5–5.0)
Alkaline Phosphatase: 61 U/L (ref 38–126)
Anion gap: 19 — ABNORMAL HIGH (ref 5–15)
BUN: 85 mg/dL — ABNORMAL HIGH (ref 8–23)
CO2: 20 mmol/L — ABNORMAL LOW (ref 22–32)
Calcium: 9.1 mg/dL (ref 8.9–10.3)
Chloride: 98 mmol/L (ref 98–111)
Creatinine, Ser: 3.6 mg/dL — ABNORMAL HIGH (ref 0.44–1.00)
GFR, Estimated: 11 mL/min — ABNORMAL LOW (ref >60)
Glucose, Bld: 362 mg/dL — ABNORMAL HIGH (ref 70–99)
Potassium: 4 mmol/L (ref 3.5–5.1)
Sodium: 137 mmol/L (ref 135–145)
Total Bilirubin: 0.2 mg/dL (ref 0.0–1.2)
Total Protein: 6.5 g/dL (ref 6.5–8.1)

## 2024-04-04 LAB — CBG MONITORING, ED: Glucose-Capillary: 324 mg/dL — ABNORMAL HIGH (ref 70–99)

## 2024-04-04 LAB — BLOOD GAS, VENOUS
Acid-base deficit: 2.8 mmol/L — ABNORMAL HIGH (ref 0.0–2.0)
Bicarbonate: 23.7 mmol/L (ref 20.0–28.0)
O2 Saturation: 38.7 %
Patient temperature: 37
pCO2, Ven: 47 mmHg (ref 44–60)
pH, Ven: 7.31 (ref 7.25–7.43)
pO2, Ven: <31 mmHg — CL (ref 32–45)

## 2024-04-04 LAB — CBC WITH DIFFERENTIAL/PLATELET
Abs Immature Granulocytes: 0.06 K/uL (ref 0.00–0.07)
Basophils Absolute: 0 K/uL (ref 0.0–0.1)
Basophils Relative: 0 %
Eosinophils Absolute: 0 K/uL (ref 0.0–0.5)
Eosinophils Relative: 0 %
HCT: 34.7 % — ABNORMAL LOW (ref 36.0–46.0)
Hemoglobin: 11.9 g/dL — ABNORMAL LOW (ref 12.0–15.0)
Immature Granulocytes: 0 %
Lymphocytes Relative: 14 %
Lymphs Abs: 2 K/uL (ref 0.7–4.0)
MCH: 32.5 pg (ref 26.0–34.0)
MCHC: 34.3 g/dL (ref 30.0–36.0)
MCV: 94.8 fL (ref 80.0–100.0)
Monocytes Absolute: 1.1 K/uL — ABNORMAL HIGH (ref 0.1–1.0)
Monocytes Relative: 8 %
Neutro Abs: 11.2 K/uL — ABNORMAL HIGH (ref 1.7–7.7)
Neutrophils Relative %: 78 %
Platelets: 270 K/uL (ref 150–400)
RBC: 3.66 MIL/uL — ABNORMAL LOW (ref 3.87–5.11)
RDW: 13.6 % (ref 11.5–15.5)
WBC: 14.3 K/uL — ABNORMAL HIGH (ref 4.0–10.5)
nRBC: 0 % (ref 0.0–0.2)

## 2024-04-04 LAB — CBC
HCT: 37 % (ref 36.0–46.0)
Hemoglobin: 12.7 g/dL (ref 12.0–15.0)
MCH: 32.2 pg (ref 26.0–34.0)
MCHC: 34.3 g/dL (ref 30.0–36.0)
MCV: 93.7 fL (ref 80.0–100.0)
Platelets: 305 K/uL (ref 150–400)
RBC: 3.95 MIL/uL (ref 3.87–5.11)
RDW: 13.3 % (ref 11.5–15.5)
WBC: 14.6 K/uL — ABNORMAL HIGH (ref 4.0–10.5)
nRBC: 0 % (ref 0.0–0.2)

## 2024-04-04 LAB — BETA-HYDROXYBUTYRIC ACID: Beta-Hydroxybutyric Acid: 0.67 mmol/L — ABNORMAL HIGH (ref 0.05–0.27)

## 2024-04-04 LAB — CREATININE, SERUM
Creatinine, Ser: 3.47 mg/dL — ABNORMAL HIGH (ref 0.44–1.00)
GFR, Estimated: 12 mL/min — ABNORMAL LOW (ref >60)

## 2024-04-04 LAB — TROPONIN T, HIGH SENSITIVITY
Troponin T High Sensitivity: 40 ng/L — ABNORMAL HIGH (ref 0–19)
Troponin T High Sensitivity: 40 ng/L — ABNORMAL HIGH (ref 0–19)

## 2024-04-04 MED ORDER — HEPARIN SODIUM (PORCINE) 5000 UNIT/ML IJ SOLN
5000.0000 [IU] | Freq: Three times a day (TID) | INTRAMUSCULAR | Status: DC
  Administered 2024-04-04 – 2024-04-10 (×18): 5000 [IU] via SUBCUTANEOUS
  Filled 2024-04-04 (×18): qty 1

## 2024-04-04 MED ORDER — SODIUM CHLORIDE 0.9 % IV BOLUS
1000.0000 mL | Freq: Once | INTRAVENOUS | Status: AC
  Administered 2024-04-04: 1000 mL via INTRAVENOUS

## 2024-04-04 MED ORDER — SODIUM CHLORIDE 0.9 % IV SOLN: 250.0000 mL | INTRAVENOUS | Status: AC | PRN

## 2024-04-04 MED ORDER — SODIUM CHLORIDE 0.9% FLUSH
3.0000 mL | INTRAVENOUS | Status: DC | PRN
  Administered 2024-04-08: 21:00:00 3 mL via INTRAVENOUS

## 2024-04-04 MED ORDER — ACETAMINOPHEN 650 MG RE SUPP: 650.0000 mg | Freq: Four times a day (QID) | RECTAL | Status: DC | PRN

## 2024-04-04 MED ORDER — HYDRALAZINE HCL 20 MG/ML IJ SOLN
5.0000 mg | Freq: Four times a day (QID) | INTRAMUSCULAR | Status: DC | PRN
  Administered 2024-04-06: 5 mg via INTRAVENOUS
  Filled 2024-04-04: qty 1

## 2024-04-04 MED ORDER — ONDANSETRON HCL 4 MG/2ML IJ SOLN: 4.0000 mg | Freq: Four times a day (QID) | INTRAMUSCULAR | Status: DC | PRN

## 2024-04-04 MED ORDER — POLYETHYLENE GLYCOL 3350 17 G PO PACK: 17.0000 g | PACK | Freq: Every day | ORAL | Status: DC | PRN

## 2024-04-04 MED ORDER — LORAZEPAM 2 MG/ML IJ SOLN: 1.0000 mg | Freq: Once | INTRAMUSCULAR | Status: DC | PRN

## 2024-04-04 MED ORDER — SODIUM CHLORIDE 0.9% FLUSH
3.0000 mL | Freq: Two times a day (BID) | INTRAVENOUS | Status: DC
  Administered 2024-04-04 – 2024-04-12 (×9): 3 mL via INTRAVENOUS

## 2024-04-04 MED ORDER — ONDANSETRON HCL 4 MG PO TABS: 4.0000 mg | ORAL_TABLET | Freq: Four times a day (QID) | ORAL | Status: DC | PRN

## 2024-04-04 MED ORDER — INSULIN ASPART 100 UNIT/ML IJ SOLN
5.0000 [IU] | Freq: Once | INTRAMUSCULAR | Status: AC
  Administered 2024-04-04: 5 [IU] via SUBCUTANEOUS
  Filled 2024-04-04: qty 5

## 2024-04-04 MED ORDER — SODIUM CHLORIDE 0.9 % IV SOLN: INTRAVENOUS | Status: AC

## 2024-04-04 MED ORDER — ACETAMINOPHEN 325 MG PO TABS
650.0000 mg | ORAL_TABLET | Freq: Four times a day (QID) | ORAL | Status: DC | PRN
  Administered 2024-04-06 – 2024-04-12 (×8): 650 mg via ORAL
  Filled 2024-04-04 (×8): qty 2

## 2024-04-04 NOTE — H&P (Signed)
 [facility] History and Physical  [name] FMW:[id number] DOB: 02-08-1930 DOA: 04/04/2024  Referring physician: ED  PCP: [name], NP   Patient is coming from: Home  Chief Complaint: Generalized weakness  HPI:  Patient is a 89 years old female with past medical history of hypertension, hyperlipidemia, neuropathy, type 2 diabetes recent history of stroke  presented to hospital with weakness and altered mental status.  Of note patient was recently thought to have UTI and was discharged home on Bactrim .  She was having generalized weakness at home with vomiting poor oral intake and had some falls at home.  [name] son stated that that she has been hallucinating since she had a stroke admission almost 3 weeks back.  [name] son feels like the patient was externally dehydrated and had been having vomiting with confusion and disorientation at home.  EMS was called in and patient's blood glucose was elevated at 400.  There was no reporting of nausea vomiting abdominal pain.  Limited history could be obtained due to patient's condition.  In the ED, vitals were stable.  Patient had some confusion and dry mucosa.  Labs were notable for mild leukocytosis with WBC at 14.3.  Urinalysis showed proteinuria but no white cells BMP showed CO2 of 20 with glucose elevated at 362 and creatinine of 3.6 from previous creatinine of 1.4, six days back..  Anion gap was 19.  EKG showed accelerated junctional rhythm.  MRI of the brain done in the ED showed no evidence of stroke but similar appearance of remote infarcts.  Chest x-ray did not show any infiltrate.  Patient received Ativan x 1, normal saline bolus in the ED and insulin  5 units and was considered for admission to the hospital for further evaluation and treatment.   Assessment and Plan Principal Problem:   AKI (acute kidney injury) Active Problems:   Acute kidney injury superimposed on chronic kidney disease   Type 2 diabetes mellitus with  complication, with long-term current use of insulin  (HCC)   CKD stage 3b, GFR 30-44 ml/min (HCC)   Hypertension   Acute metabolic encephalopathy  Acute metabolic encephalopathy, hallucinations.  Hallucinations started after stroke for the last 3 weeks and has gotten worse..  Patient was on Bactrim  for UTI recently.  Will hold off with that.  Urinalysis negative at this time.  MRI of the brain without any acute findings except for previous stroke.  Delirium precautions.  Patient appears to be volume depleted with AKI.  Continue with hydration.  Patient is afebrile.  AKI on CKD 3B secondary to use of Bactrim  and volume depletion.  Continue IV fluids.  Hold off with Bactrim .  Check BMP in AM.  Strict intake and output charting Daily weights.  Avoid nephrotoxic medications.  Creatinine on presentation at 3.6 from creatinine of 1.4, six days back  Diabetes mellitus type 2 with hyperglycemia.  Initial blood glucose up to 400.  Received IV fluids and insulin  will continue with that.  Has mild gap but do not think that she would need any insulin  drip.  Family stating that she has not been eating and has been throwing up as well.  Check hemoglobin A1c at this time, A1c 6 months back was 6.5  Mild elevated troponin.  Nonspecific.  No chest pain.  Could be secondary to AKI.  Had elevated troponins in the past as well  Mild leukocytosis.  Will continue to monitor but could be reactive/volume depletion related.  No obvious signs of infection at this  time.  History of remote stroke.  MRI brain this time without any acute stroke.  Has been declining since the stroke as per the family with hallucinations  Generalized weakness, deconditioning, falls, ambulatory dysfunction.  Patient was falling out of bed.  Will get PT OT evaluation. Has CNA at home, Check vitamin B12, vitamin d  levels.  Family wishing for rehabilitation on discharge.  Patient family saying that she has been having a decline after her stroke in the  past.  Goals of care.  Spoke with the patient's son [name] on the phone.  He did not wish her mom to undergo resuscitation and wishes surgical nursing facility if needed.  DNR discussed.  If declining might benefit from palliative care consultation for  DVT Prophylaxis: Heparin  subcu  Review of Systems:  Limited review due to patient's condition   Past Medical History:  Diagnosis Date   Arthritis    Chronic back pain    Cystoid macular edema of right eye 08/19/2019   Diabetic neuropathy (HCC)    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) 11/18/2019   Hypercholesteremia    Hypertension    Liver lesion    Neuropathy    Type II or unspecified type diabetes mellitus without mention of complication, not stated as uncontrolled    Vitamin D  deficiency    Wears glasses    Past Surgical History:  Procedure Laterality Date   ABDOMINAL HYSTERECTOMY     APPENDECTOMY     BACK SURGERY  1993   lumb lam-fusion   BREAST EXCISIONAL BIOPSY Right 1967   benign   CESAREAN SECTION     x 3    COLONOSCOPY     EYE SURGERY     both cataracts   TRIGGER FINGER RELEASE  01/10/2012   Procedure: RELEASE TRIGGER FINGER/A-1 PULLEY;  Surgeon: Branden KANDICE Herald, MD;  Location: [facility];  Service: Orthopedics;  Laterality: Right;  right ring trigger release    Social History:  reports that she has never smoked. She has never used smokeless tobacco. She reports that she does not drink alcohol and does not use drugs.  Allergies[1]  Family History  Problem Relation Age of Onset   Diabetes Sister    Liver disease Sister    Liver cancer Sister    Lymphoma Sister    Stroke Brother    Diabetes Brother    Kidney cancer Brother    Colon cancer Neg Hx      Prior to Admission medications  Medication Sig Start Date End Date Taking? Authorizing Provider  acetaminophen  (TYLENOL ) 325 MG tablet Take 325-650 mg by mouth every 6 (six)  hours as needed (for pain or headaches).    [provider]  bisoprolol  (ZEBETA ) 5 MG tablet Take 2.5 mg by mouth in the morning.    [provider]  cephALEXin  (KEFLEX ) 250 MG capsule Take 250 mg by mouth daily with breakfast.    [provider]  clopidogrel  (PLAVIX ) 75 MG tablet Take 1 tablet (75 mg total) by mouth daily for 21 days. Patient taking differently: Take 75 mg by mouth in the morning. 03/20/24 04/10/24  Gherghe, Costin M, MD  CRANBERRY PO Take 1 tablet by mouth See admin instructions. Cranberry 500 mg + bacillus sporogenes + calcium  + Vitamin C = Take 1 caplet by mouth at bedtime    [provider]  D-MANNOSE PO Take 700 mg by mouth at bedtime.    [provider]  Insulin  Glargine (BASAGLAR  KWIKPEN) 100 UNIT/ML Inject 5-8 Units into the skin at bedtime as needed (for a BGL of 200 or greater).    [provider]  Melatonin 5 MG CHEW Chew 5 mg by mouth at bedtime.    [provider]  metFORMIN  (GLUCOPHAGE ) 1000 MG tablet Take 1,000 mg by mouth 2 (two) times daily.    [provider]  Multiple Vitamins-Minerals (CENTRUM SILVER WOMEN 50+ PO) Take 1 tablet by mouth daily with breakfast.    [provider]  rosuvastatin  (CRESTOR ) 20 MG tablet Take 10 mg by mouth in the morning.    [provider]  sulfamethoxazole -trimethoprim  (BACTRIM  DS) 800-160 MG tablet Take 1 tablet by mouth 2 (two) times daily for 7 days. 03/30/24 04/06/24  Cottie Donnice PARAS, MD  traMADol  (ULTRAM ) 50 MG tablet Take 50 mg by mouth See admin instructions. Take 50 mg by mouth at bedtime and an additional 50 mg up to two times a day as needed for pain    [provider]    Physical Exam:  Vitals:   04/04/24 1612 04/04/24 1615 04/04/24 1700 04/04/24 2015  BP:  (!) 145/69 (!) 143/75   Pulse:  83 79   Resp:  15 16   Temp:    98.4 F (36.9 C)  TempSrc:    Oral  SpO2: 100% 98% 100%    Wt Readings from Last 3 Encounters:   03/18/24 49.4 kg  10/12/23 53.6 kg  04/20/23 53.6 kg   There is no height or weight on file to calculate BMI.  General:  Average built, not in obvious distress alert awake and oriented to place, confused otherwise elderly female, appears chronically ill HENT: Normocephalic, No scleral pallor or icterus noted. Oral mucosa is dry Chest:  Clear breath sounds.  . No crackles or wheezes.  CVS: S1 &S2 heard. No murmur.  Regular rate and rhythm. Abdomen: Soft, nontender, nondistended.  Bowel sounds are heard. No abdominal mass palpated Extremities: No cyanosis, clubbing or edema.  Peripheral pulses are palpable. Psych: Alert, awake and oriented, normal mood CNS:  No cranial nerve deficits.  Moves all extremities, Skin: Warm and dry.  No rashes noted.  Skin turgor diminished  Labs on Admission:   CBC: Recent Labs  Lab 03/29/24 1740 04/04/24 1632  WBC 11.0* 14.3*  NEUTROABS 6.4 11.2*  HGB 12.5 11.9*  HCT 37.5 34.7*  MCV 95.4 94.8  PLT 274 270    Basic Metabolic Panel: Recent Labs  Lab 03/29/24 1740 04/04/24 1632  NA 140 137  K 4.3 4.0  CL 98 98  CO2 26 20*  GLUCOSE 230* 362*  BUN 29* 85*  CREATININE 1.47* 3.60*  CALCIUM  10.4* 9.1    Liver Function Tests: Recent Labs  Lab 04/04/24 1632  AST 26  ALT 12  ALKPHOS 61  BILITOT 0.2  PROT 6.5  ALBUMIN 3.7   No results for input(s): LIPASE, AMYLASE in the last 168 hours. No results for input(s): AMMONIA in the last 168 hours.  Cardiac Enzymes: No results for input(s): CKTOTAL, CKMB, CKMBINDEX, TROPONINI in the last 168 hours.  BNP (last 3 results) No results for input(s): BNP in the last 8760 hours.  ProBNP (last 3 results) No results for input(s): PROBNP in the last 8760 hours.  CBG: Recent Labs  Lab 04/04/24 1630  GLUCAP 324*    Lipase     Component Value Date/Time   LIPASE 29 11/23/2021 0002     Urinalysis  Component Value Date/Time   COLORURINE YELLOW 04/04/2024 1605    APPEARANCEUR HAZY (A) 04/04/2024 1605   LABSPEC 1.014 04/04/2024 1605   PHURINE 5.0 04/04/2024 1605   GLUCOSEU 150 (A) 04/04/2024 1605   HGBUR SMALL (A) 04/04/2024 1605   BILIRUBINUR NEGATIVE 04/04/2024 1605   KETONESUR NEGATIVE 04/04/2024 1605   PROTEINUR 100 (A) 04/04/2024 1605   UROBILINOGEN 0.2 07/29/2012 1237   NITRITE NEGATIVE 04/04/2024 1605   LEUKOCYTESUR NEGATIVE 04/04/2024 1605     Drugs of Abuse     Component Value Date/Time   LABOPIA NEGATIVE 03/29/2024 1835   COCAINSCRNUR NEGATIVE 03/29/2024 1835   LABBENZ NEGATIVE 03/29/2024 1835   AMPHETMU NEGATIVE 03/29/2024 1835   THCU NEGATIVE 03/29/2024 1835   LABBARB NEGATIVE 03/29/2024 1835      Radiological Exams on Admission: MR BRAIN WO CONTRAST Result Date: 04/04/2024 EXAM: MRI BRAIN WITHOUT CONTRAST 04/04/2024 05:58:25 PM TECHNIQUE: Multiplanar multisequence MRI of the head/brain was performed without the administration of intravenous contrast. COMPARISON: CT head 03/29/2024 and MRI head 03/18/2024. CLINICAL HISTORY: Neuro deficit, acute, stroke suspected. FINDINGS: BRAIN AND VENTRICLES: No acute infarct. No intracranial hemorrhage. No mass. No midline shift. No hydrocephalus. The sella is unremarkable. Normal flow voids. Mild parenchymal volume loss. Focal encephalomalacia in the right frontal periventricular white matter with surrounding gliosis suggestive of remote infarct. Additional remote lacunar infarct in the left paramedian brainstem at the junction of the midbrain and pons. Similar appearance of chronic microvascular ischemic changes. Remote lacunar infarct in the right thalamus. Chronic microhemorrhages in the right thalamus and left external capsule. Small remote infarcts in the left cerebellum. ORBITS: Bilateral lens replacement. SINUSES AND MASTOIDS: No acute abnormality. BONES AND SOFT TISSUES: Normal marrow signal. No acute soft tissue abnormality. IMPRESSION: 1. No acute intracranial abnormality. 2. Similar  appearance of remote infarcts, chronic microvascular ischemic changes, and mild parenchymal volume loss. Electronically signed by: Donnice Mania MD 04/04/2024 07:43 PM EST RP Workstation: [id number]   DG Chest Port 1 View Result Date: 04/04/2024 CLINICAL DATA:  Altered mental status. EXAM: PORTABLE CHEST 1 VIEW COMPARISON:  Chest radiograph dated 03/18/2024 FINDINGS: No focal consolidation, pleural effusion or pneumothorax. The cardiac silhouette is within normal limits. Atherosclerotic calcification of the aorta. No acute osseous pathology. IMPRESSION: No active disease. Electronically Signed   By: Vanetta Chou M.D.   On: 04/04/2024 17:16    EKG: Personally reviewed by me which shows junctional rhythm   Consultant: None  Code Status: DNR  Microbiology none  Antibiotics: None  Family Communication:  Patients' condition and plan of care including tests being ordered have been discussed with the patient and the patient's son on the phone at length who indicate understanding and agree with the plan.   Status is: Observation   Severity of Illness: The appropriate patient status for this patient is OBSERVATION. Observation status is judged to be reasonable and necessary in order to provide the required intensity of service to ensure the patient's safety. The patient's presenting symptoms, physical exam findings, and initial radiographic and laboratory data in the context of their medical condition is felt to place them at decreased risk for further clinical deterioration. Furthermore, it is anticipated that the patient will be medically stable for discharge from the hospital within 2 midnights of admission.   Signed, Vernal Alstrom, MD [facility] 04/04/2024        [1]  Allergies Allergen Reactions   Ciprofloxacin  Other (See Comments)    Dizziness    Morphine And Codeine Other (See Comments)  Fuzzy feeling

## 2024-04-04 NOTE — ED Provider Notes (Signed)
 [place] EMERGENCY DEPARTMENT AT [facility] Provider Note   CSN: [id number] Arrival date & time: 04/04/24  1559     Patient presents with: Weakness and Altered Mental Status   [name] is a 89 y.o. female hx of UTI, recent stroke, here with altered mental status and hallucinations.  Patient is currently living at home with family.  Patient apparently has been having some visual hallucinations.  Patient was diagnosed with stroke on November 24.  Patient then came back here about a week ago for similar symptoms and MRI was not available and CT head was unremarkable.  Patient was diagnosed with UTI and finished a course of Bactrim .  Patient however has worsening hallucination has been awake for multiple nights.  She she also has not been eating.  EMS noticed that her blood sugar was over 400.  No reported vomiting   The history is provided by the EMS personnel.       Prior to Admission medications  Medication Sig Start Date End Date Taking? Authorizing Provider  acetaminophen  (TYLENOL ) 325 MG tablet Take 325-650 mg by mouth every 6 (six) hours as needed (for pain or headaches).    [provider]  bisoprolol  (ZEBETA ) 5 MG tablet Take 2.5 mg by mouth in the morning.    [provider]  cephALEXin  (KEFLEX ) 250 MG capsule Take 250 mg by mouth daily with breakfast.    [provider]  clopidogrel  (PLAVIX ) 75 MG tablet Take 1 tablet (75 mg total) by mouth daily for 21 days. Patient taking differently: Take 75 mg by mouth in the morning. 03/20/24 04/10/24  Gherghe, Costin M, MD  CRANBERRY PO Take 1 tablet by mouth See admin instructions. Cranberry 500 mg + bacillus sporogenes + calcium  + Vitamin C = Take 1 caplet by mouth at bedtime    [provider]  D-MANNOSE PO Take 700 mg by mouth at bedtime.    [provider]  Insulin  Glargine (BASAGLAR  KWIKPEN) 100 UNIT/ML Inject 5-8 Units into the skin at bedtime as needed (for a BGL of 200  or greater).    [provider]  Melatonin 5 MG CHEW Chew 5 mg by mouth at bedtime.    [provider]  metFORMIN  (GLUCOPHAGE ) 1000 MG tablet Take 1,000 mg by mouth 2 (two) times daily.    [provider]  Multiple Vitamins-Minerals (CENTRUM SILVER WOMEN 50+ PO) Take 1 tablet by mouth daily with breakfast.    [provider]  rosuvastatin  (CRESTOR ) 20 MG tablet Take 10 mg by mouth in the morning.    [provider]  sulfamethoxazole -trimethoprim  (BACTRIM  DS) 800-160 MG tablet Take 1 tablet by mouth 2 (two) times daily for 7 days. 03/30/24 04/06/24  Cottie Donnice PARAS, MD  traMADol  (ULTRAM ) 50 MG tablet Take 50 mg by mouth See admin instructions. Take 50 mg by mouth at bedtime and an additional 50 mg up to two times a day as needed for pain    [provider]    Allergies: Ciprofloxacin  and Morphine and codeine    Review of Systems  Neurological:  Positive for weakness.  All other systems reviewed and are negative.   Updated Vital Signs BP (!) 143/75   Pulse 79   Temp 98.5 F (36.9 C) (Oral)   Resp 16   SpO2 100%   Physical Exam Vitals and nursing note reviewed.  Constitutional:      Comments: Chronically ill  HENT:     Head: Normocephalic.  Nose: Nose normal.     Mouth/Throat:     Mouth: Mucous membranes are dry.  Eyes:     Extraocular Movements: Extraocular movements intact.     Pupils: Pupils are equal, round, and reactive to light.  Cardiovascular:     Rate and Rhythm: Normal rate and regular rhythm.     Pulses: Normal pulses.     Heart sounds: Normal heart sounds.  Pulmonary:     Effort: Pulmonary effort is normal.     Breath sounds: Normal breath sounds.  Abdominal:     General: Abdomen is flat.     Palpations: Abdomen is soft.  Musculoskeletal:        General: Normal range of motion.     Cervical back: Normal range of motion and neck supple.  Skin:    General: Skin is warm.     Capillary Refill: Capillary  refill takes less than 2 seconds.  Neurological:     Mental Status: She is alert.     Comments: Patient appears confused but moving extremities.  No obvious slurred speech or facial droop  Psychiatric:        Mood and Affect: Mood normal.        Behavior: Behavior normal.     (all labs ordered are listed, but only abnormal results are displayed) Labs Reviewed  URINALYSIS, W/ REFLEX TO CULTURE (INFECTION SUSPECTED) - Abnormal; Notable for the following components:      Result Value   APPearance HAZY (*)    Glucose, UA 150 (*)    Hgb urine dipstick SMALL (*)    Protein, ur 100 (*)    All other components within normal limits  CBC WITH DIFFERENTIAL/PLATELET - Abnormal; Notable for the following components:   WBC 14.3 (*)    RBC 3.66 (*)    Hemoglobin 11.9 (*)    HCT 34.7 (*)    Neutro Abs 11.2 (*)    Monocytes Absolute 1.1 (*)    All other components within normal limits  COMPREHENSIVE METABOLIC PANEL WITH GFR - Abnormal; Notable for the following components:   CO2 20 (*)    Glucose, Bld 362 (*)    BUN 85 (*)    Creatinine, Ser 3.60 (*)    GFR, Estimated 11 (*)    Anion gap 19 (*)    All other components within normal limits  BLOOD GAS, VENOUS - Abnormal; Notable for the following components:   pO2, Ven <31 (*)    Acid-base deficit 2.8 (*)    All other components within normal limits  BETA-HYDROXYBUTYRIC ACID - Abnormal; Notable for the following components:   Beta-Hydroxybutyric Acid 0.67 (*)    All other components within normal limits  CBG MONITORING, ED - Abnormal; Notable for the following components:   Glucose-Capillary 324 (*)    All other components within normal limits  TROPONIN T, HIGH SENSITIVITY - Abnormal; Notable for the following components:   Troponin T High Sensitivity 40 (*)    All other components within normal limits  TROPONIN T, HIGH SENSITIVITY    EKG: EKG Interpretation Date/Time:  Thursday April 04 2024 16:09:28 EST Ventricular Rate:   84 PR Interval:    QRS Duration:  102 QT Interval:  390 QTC Calculation: 461 R Axis:   91  Text Interpretation: Accelerated junctional rhythm Right axis deviation Low voltage, extremity and precordial leads Borderline ST depression, lateral leads ST elevation, consider inferior injury No significant change since last tracing Confirmed by Patt Alm DEL [phone])  on 04/04/2024 4:32:00 PM  Radiology: MR BRAIN WO CONTRAST Result Date: 04/04/2024 EXAM: MRI BRAIN WITHOUT CONTRAST 04/04/2024 05:58:25 PM TECHNIQUE: Multiplanar multisequence MRI of the head/brain was performed without the administration of intravenous contrast. COMPARISON: CT head 03/29/2024 and MRI head 03/18/2024. CLINICAL HISTORY: Neuro deficit, acute, stroke suspected. FINDINGS: BRAIN AND VENTRICLES: No acute infarct. No intracranial hemorrhage. No mass. No midline shift. No hydrocephalus. The sella is unremarkable. Normal flow voids. Mild parenchymal volume loss. Focal encephalomalacia in the right frontal periventricular white matter with surrounding gliosis suggestive of remote infarct. Additional remote lacunar infarct in the left paramedian brainstem at the junction of the midbrain and pons. Similar appearance of chronic microvascular ischemic changes. Remote lacunar infarct in the right thalamus. Chronic microhemorrhages in the right thalamus and left external capsule. Small remote infarcts in the left cerebellum. ORBITS: Bilateral lens replacement. SINUSES AND MASTOIDS: No acute abnormality. BONES AND SOFT TISSUES: Normal marrow signal. No acute soft tissue abnormality. IMPRESSION: 1. No acute intracranial abnormality. 2. Similar appearance of remote infarcts, chronic microvascular ischemic changes, and mild parenchymal volume loss. Electronically signed by: Donnice Mania MD 04/04/2024 07:43 PM EST RP Workstation: [id number]   DG Chest Port 1 View Result Date: 04/04/2024 CLINICAL DATA:  Altered mental status. EXAM: PORTABLE CHEST 1  VIEW COMPARISON:  Chest radiograph dated 03/18/2024 FINDINGS: No focal consolidation, pleural effusion or pneumothorax. The cardiac silhouette is within normal limits. Atherosclerotic calcification of the aorta. No acute osseous pathology. IMPRESSION: No active disease. Electronically Signed   By: Vanetta Chou M.D.   On: 04/04/2024 17:16     Procedures   Medications Ordered in the ED  LORazepam (ATIVAN) injection 1 mg (has no administration in time range)  sodium chloride  0.9 % bolus 1,000 mL (1,000 mLs Intravenous New Bag/Given 04/04/24 1656)  insulin  aspart (novoLOG ) injection 5 Units (5 Units Subcutaneous Given 04/04/24 1624)                                    Medical Decision Making LIYAT FAULKENBERRY is a 89 y.o. female here presenting with altered mental status.  Consider UTI versus pneumonia versus new stroke versus early DKA or hyperosmolar.  Plan to get CBC CMP and beta hydroxy and VBG and UA and MRI brain.  Will hydrate patient and likely will admit.  7:45 PM I reviewed patient's labs and patient has mildly elevated beta hydroxy and anion gap of 20.  However she is not acidotic.  Patient appears very dehydrated and has AKI.  Patient was given IV fluids and subcu insulin .  Chest x-ray is clear and urinalysis did not show any UTI.  MRI did not show any new stroke.  Patient will be admitted for AKI and hyperglycemia  Problems Addressed: AKI (acute kidney injury): acute illness or injury Hyperglycemia: acute illness or injury  Amount and/or Complexity of Data Reviewed Labs: ordered. Decision-making details documented in ED Course. Radiology: ordered and independent interpretation performed. Decision-making details documented in ED Course. ECG/medicine tests: ordered and independent interpretation performed. Decision-making details documented in ED Course.  Risk Prescription drug management. Decision regarding hospitalization.     Final diagnoses:  Hyperglycemia  AKI  (acute kidney injury)    ED Discharge Orders     None          Patt Alm Macho, MD 04/04/24 [phone]

## 2024-04-04 NOTE — ED Triage Notes (Signed)
 Pt BIB ems for generalized weakness and more confused than normal. Hyperglycemia, CBG 450. Frequent UTIs, poor intake, declined mobility.

## 2024-04-04 NOTE — Hospital Course (Addendum)
 [name] is a 89 year old female with likely previously undiagnosed dementia, DMII, neuropathy, chronic back pain, vitamin D  deficiency, HTN, HLD, CVA. She was admitted with generalized weakness, confusion, visual hallucinations, poor intake, falls at home. Creatinine on admission 3.6, BUN 85. She was started on IV fluids and admitted for further workup.     Assessment & Plan:    Acute metabolic encephalopathy Hallucinations Possible previously undiagnosed dementia Failure to thrive - probable combo of uremia from dehydration/poor intake with background of stroke history, frailty, and dementia  -MRI brain did not reveal any acute findings - hx CVA 03/18/24 with ongoing decline since - Chest x-ray was nonrevealing. - Urine culture was not significant.   - family reported long-term memory loss, visual hallucinations, falls and failure to thrive - mentation has improved with IVF and improvement in renal function  - Continue nightly Seroquel  - Continue encouraging good intake - PT/OT evaluations, plan is for SNF - Eventually needs at least palliative care when going home from rehab  AKI on CKD 3B -Likely multifactorial - Baseline CKD 3B - Poor p.o. intake is reported - Recent UTI - Recent Bactrim  use. -Creatinine 3.6 on admission.  Has steadily improved with fluids   Diabetes mellitus type 2 with hyperglycemia: A1c of 7.2% - Continue diet control   Mild elevated troponin -Likely type II elevation - Advanced chronic kidney disease at baseline, with acute kidney injury is also noted. -No chest pain or shortness of breath reported.    Mild leukocytosis - Chronic and intermittent - No constitutional symptoms endorsed - Negative cultures   History CVA - MRI brain 03/18/2024 with acute infarct involving left parietal lobe.  Moderate chronic microvascular changes and remote infarcts noted right frontal periventricular white matter, right thalamus with associated chronic  microhemorrhage, and left cerebellum   Generalized weakness, deconditioning, falls, ambulatory dysfunction: - History of falling; probable contribution from prior left cerebellar stroke as well as deconditioning and dehydration; overall failure to thrive -PT/OT evals, awaiting SNF placement   Goals of care - Patient is DNR. - Palliative care consult to define goals of care -Home with palliative care after rehab

## 2024-04-04 NOTE — ED Notes (Signed)
 Pt pulled IV out on her arm. IV was found on her bed next to her.

## 2024-04-05 ENCOUNTER — Other Ambulatory Visit: Payer: Self-pay

## 2024-04-05 DIAGNOSIS — N179 Acute kidney failure, unspecified: Secondary | ICD-10-CM | POA: Diagnosis not present

## 2024-04-05 LAB — CBC
HCT: 33.2 % — ABNORMAL LOW (ref 36.0–46.0)
Hemoglobin: 11.4 g/dL — ABNORMAL LOW (ref 12.0–15.0)
MCH: 32.4 pg (ref 26.0–34.0)
MCHC: 34.3 g/dL (ref 30.0–36.0)
MCV: 94.3 fL (ref 80.0–100.0)
Platelets: 263 K/uL (ref 150–400)
RBC: 3.52 MIL/uL — ABNORMAL LOW (ref 3.87–5.11)
RDW: 13.4 % (ref 11.5–15.5)
WBC: 13.4 K/uL — ABNORMAL HIGH (ref 4.0–10.5)
nRBC: 0 % (ref 0.0–0.2)

## 2024-04-05 LAB — BASIC METABOLIC PANEL WITH GFR
Anion gap: 15 (ref 5–15)
BUN: 76 mg/dL — ABNORMAL HIGH (ref 8–23)
CO2: 21 mmol/L — ABNORMAL LOW (ref 22–32)
Calcium: 8.9 mg/dL (ref 8.9–10.3)
Chloride: 106 mmol/L (ref 98–111)
Creatinine, Ser: 3.13 mg/dL — ABNORMAL HIGH (ref 0.44–1.00)
GFR, Estimated: 13 mL/min — ABNORMAL LOW (ref >60)
Glucose, Bld: 231 mg/dL — ABNORMAL HIGH (ref 70–99)
Potassium: 3.8 mmol/L (ref 3.5–5.1)
Sodium: 142 mmol/L (ref 135–145)

## 2024-04-05 LAB — MAGNESIUM: Magnesium: 2 mg/dL (ref 1.7–2.4)

## 2024-04-05 LAB — VITAMIN D 25 HYDROXY (VIT D DEFICIENCY, FRACTURES): Vit D, 25-Hydroxy: 56.6 ng/mL (ref 30–100)

## 2024-04-05 LAB — HEMOGLOBIN A1C
Hgb A1c MFr Bld: 7.2 % — ABNORMAL HIGH (ref 4.8–5.6)
Mean Plasma Glucose: 159.94 mg/dL

## 2024-04-05 LAB — PROCALCITONIN: Procalcitonin: 0.12 ng/mL

## 2024-04-05 LAB — VITAMIN B12: Vitamin B-12: 544 pg/mL (ref 180–914)

## 2024-04-05 LAB — TSH: TSH: 0.755 u[IU]/mL (ref 0.350–4.500)

## 2024-04-05 MED ORDER — QUETIAPINE FUMARATE 25 MG PO TABS
25.0000 mg | ORAL_TABLET | Freq: Every day | ORAL | Status: DC
  Administered 2024-04-05 – 2024-04-06 (×2): 25 mg via ORAL
  Filled 2024-04-05 (×2): qty 1

## 2024-04-05 NOTE — Evaluation (Signed)
 Physical Therapy Evaluation Patient Details Name: [name] MRN: [id number] DOB: Sep 04, 1929 Today's Date: 04/05/2024  History of Present Illness  Patient is a 89 year old female who presented to [facility] on 04/04/2024 with dx AKI, acute metabolic encephalopathy/hallucinations. Was falling out of bed at home and family reports decline, has been hallucinating since she had a stroke admission almost 3 weeks back.  Concern per Lewy body dementia vs vascular dementia and no new acute findings with brain MRI.  PMH: CVA, hypokalemia, CKD III, DM II, HTN, and HLD.  Clinical Impression    Pt admitted with above diagnosis.  Pt currently with functional limitations due to the deficits listed below (see PT Problem List). Pt in bed when therapists arrived. Son present and able to provide insight to PLOF and expressed concern per pt cognitive and physical mobility decline over the past 3 wks following CVA and increased difficulty to care for mother in home setting. Pt agreeable to therapy intervention. Pt required mod A for supine to sit, min A for sit to stand  from EOB and min A for gait tasks in personal room and 25 feet in hallway with RW and multimodal cues. Pt left seated in recliner, all needs in place and son present.  Patient will benefit from continued inpatient follow up therapy, <3 hours/day. Pt will benefit from acute skilled PT to increase their independence and safety with mobility to allow discharge.         If plan is discharge home, recommend the following: A little help with bathing/dressing/bathroom;Assistance with cooking/housework;Assist for transportation;A lot of help with walking and/or transfers;Help with stairs or ramp for entrance   Can travel by private vehicle        Equipment Recommendations None recommended by PT  Recommendations for Other Services       Functional Status Assessment Patient has had a recent decline in their functional status and demonstrates the ability to  make significant improvements in function in a reasonable and predictable amount of time.     Precautions / Restrictions Precautions Precautions: Fall Recall of Precautions/Restrictions: Impaired Restrictions Weight Bearing Restrictions Per Provider Order: No      Mobility  Bed Mobility Overal bed mobility: Needs Assistance Bed Mobility: Supine to Sit     Supine to sit: HOB elevated, Used rails, Mod assist     General bed mobility comments: increased time, mulimodal cues, pt able to spontaneously come in to long sit, however difficulty transitioning LEs to EOB    Transfers Overall transfer level: Needs assistance Equipment used: Rolling walker (2 wheels) Transfers: Sit to/from Stand Sit to Stand: Min assist           General transfer comment: Stood from EOB pull to stand at [employer] min A for safety and stabiltiy with initial standing and cues    Ambulation/Gait Ambulation/Gait assistance: Editor, Commissioning (Feet): 25 Feet Assistive device: Rolling walker (2 wheels) Gait Pattern/deviations: Narrow base of support, Trunk flexed, Step-to pattern Gait velocity: decreased     General Gait Details: cues for safety, posture, proper distance from RW, maintaining B UE support at RW with pt reaching for wall, funiture ect and required redirection, A to navigate obstacles espeically on the R side, narrow BOS and difficulty with turns no noted scissor stepping pattern  Stairs            Wheelchair Mobility     Tilt Bed    Modified Rankin (Stroke Patients Only)  Balance Overall balance assessment: Needs assistance Sitting-balance support: Feet supported Sitting balance-Leahy Scale: Fair Sitting balance - Comments: seated EOB, no overt LOB   Standing balance support: Single extremity supported, Bilateral upper extremity supported, During functional activity, Reliant on assistive device for balance Standing balance-Leahy Scale: Poor Standing balance  comment: B UE support at RW and min A                             Pertinent Vitals/Pain Pain Assessment Pain Assessment: No/denies pain    Home Living Family/patient expects to be discharged to:: Private residence Living Arrangements: Children Available Help at Discharge: Family;Available 24 hours/day Type of Home: House Home Access: Stairs to enter Entrance Stairs-Rails: Right Entrance Stairs-Number of Steps: 2   Home Layout: One level Home Equipment: Wheelchair - manual;Cane - single point;Rollator (4 wheels);Shower seat - built in Additional Comments: Son, reports patient uses four wheel walker around the home independently. For community ambulation, she uses single point cane and hand hold assist from family. Son reports a significant decrease in safe and IND mobility and cognitive changes since CVA ~ 3 wks ago 11/25    Prior Function Prior Level of Function : Needs assist       Physical Assist : Mobility (physical);ADLs (physical) Mobility (physical): Gait;Stairs ADLs (physical): Bathing;IADLs Mobility Comments: rollator in home, cane for outside; requires additional assistance from sons with hand hold assist for stairs and long distance ambulation ADLs Comments: pt's 3 son rotate staying with her to help complete IADLs. She has a CNA neighbor who comes by x2/week to help with showers. Son, Garrel, reports there is someone with her 24/7 and family provides transportation as needed. Son reports pt previously had home health PT/OT/RN that was really helpful however pt was discharged from care.     Extremity/Trunk Assessment        Lower Extremity Assessment Lower Extremity Assessment: Generalized weakness    Cervical / Trunk Assessment Cervical / Trunk Assessment: Kyphotic  Communication   Communication Communication: Impaired Factors Affecting Communication: Difficulty expressing self (intermittent word finding difficulty)    Cognition Arousal:  Alert Behavior During Therapy: WFL for tasks assessed/performed   PT - Cognitive impairments: Orientation, Memory, Safety/Judgement, Problem solving, Sequencing   Orientation impairments: Person                   PT - Cognition Comments: oriented to name only, unable to provide DOB; pt is unable to state year; not oriented to place or situation Following commands: Impaired Following commands impaired: Follows one step commands inconsistently, Follows one step commands with increased time     Cueing Cueing Techniques: Verbal cues, Gestural cues, Tactile cues, Visual cues     General Comments      Exercises     Assessment/Plan    PT Assessment Patient needs continued PT services  PT Problem List Decreased coordination;Decreased cognition;Decreased balance;Decreased mobility;Decreased activity tolerance;Decreased strength;Decreased knowledge of use of DME;Decreased safety awareness;Decreased knowledge of precautions       PT Treatment Interventions Gait training;Therapeutic exercise;Balance training;Functional mobility training;Patient/family education;Stair training    PT Goals (Current goals can be found in the Care Plan section)  Acute Rehab PT Goals Patient Stated Goal: go home PT Goal Formulation: With patient/family Time For Goal Achievement: 04/19/24 Potential to Achieve Goals: Good    Frequency Min 3X/week     Co-evaluation PT/OT/SLP Co-Evaluation/Treatment: Yes Reason for Co-Treatment: Complexity of the patient's impairments (multi-system  involvement);Necessary to address cognition/behavior during functional activity;For patient/therapist safety PT goals addressed during session: Mobility/safety with mobility;Balance;Proper use of DME;Strengthening/ROM OT goals addressed during session: ADL's and self-care;Proper use of Adaptive equipment and DME;Strengthening/ROM       AM-PAC PT 6 Clicks Mobility  Outcome Measure Help needed turning from your back to  your side while in a flat bed without using bedrails?: A Little Help needed moving from lying on your back to sitting on the side of a flat bed without using bedrails?: A Little Help needed moving to and from a bed to a chair (including a wheelchair)?: A Little Help needed standing up from a chair using your arms (e.g., wheelchair or bedside chair)?: A Little Help needed to walk in hospital room?: A Little Help needed climbing 3-5 steps with a railing? : Total 6 Click Score: 16    End of Session Equipment Utilized During Treatment: Gait belt Activity Tolerance: Patient tolerated treatment well Patient left: with call bell/phone within reach;with family/visitor present;in chair;with chair alarm set Nurse Communication: Mobility status PT Visit Diagnosis: Unsteadiness on feet (R26.81);Difficulty in walking, not elsewhere classified (R26.2);Repeated falls (R29.6);Muscle weakness (generalized) (M62.81);Other abnormalities of gait and mobility (R26.89)    Time: 1017-1040 PT Time Calculation (min) (ACUTE ONLY): 23 min   Charges:   PT Evaluation $PT Eval Low Complexity: 1 Low   PT General Charges $$ ACUTE PT VISIT: 1 Visit         [place], PT Acute Rehab   [place] VEAR Drone 04/05/2024, 1:52 PM

## 2024-04-05 NOTE — Evaluation (Signed)
 Occupational Therapy Evaluation Patient Details Name: [name] MRN: [id number] DOB: Apr 03, 1930 Today's Date: 04/05/2024   History of Present Illness   Patient is a 89 year old female who presented to [facility] on 04/04/2024 with dx AKI, acute metabolic encephalopathy/hallucinations. Was falling out of bed at home and family reports decline, has been hallucinating since she had a stroke admission almost 3 weeks back.  Concern per Lewy body dementia vs vascular dementia and no new acute findings with brain MRI.  PMH: CVA, hypokalemia, CKD III, DM II, HTN, and HLD.     Clinical Impressions PTA, patient lives at home with family support and required increased assistance in the past few weeks with all ADL's and mobility. Son present bedside for support and information due to patient being limited historian. Currently, patient presents with sub baseline deficits outlined below (see OT Problem List for details) most significantly decreased cognition, balance, activity tolerance and generalized muscle weakness limiting BADL's and functional mobility performance. Patient will benefit from continued inpatient follow up therapy, <3 hours/day. Patient requires continued Acute care hospital level OT services to progress safety and functional performance and allow for discharge.     If plan is discharge home, recommend the following:   Assistance with cooking/housework;Direct supervision/assist for medications management;Direct supervision/assist for financial management;Assist for transportation;Supervision due to cognitive status;A lot of help with walking and/or transfers;A lot of help with bathing/dressing/bathroom;Assistance with feeding     Functional Status Assessment   Patient has had a recent decline in their functional status and demonstrates the ability to make significant improvements in function in a reasonable and predictable amount of time.     Equipment Recommendations   None  recommended by OT      Precautions/Restrictions   Precautions Precautions: Fall Recall of Precautions/Restrictions: Impaired Restrictions Weight Bearing Restrictions Per Provider Order: No     Mobility Bed Mobility Overal bed mobility: Needs Assistance Bed Mobility: Supine to Sit     Supine to sit: HOB elevated, Used rails, Mod assist     General bed mobility comments: increased time, mulimodal cues, pt able to spontaneously come in to long sit, however difficulty transitioning LEs to EOB    Transfers Overall transfer level: Needs assistance Equipment used: Rolling walker (2 wheels) Transfers: Sit to/from Stand Sit to Stand: Min assist           General transfer comment: Stood from EOB pull to stand at RW min A for safety and stabiltiy with initial standing and cues      Balance Overall balance assessment: Needs assistance Sitting-balance support: Feet supported Sitting balance-Leahy Scale: Fair Sitting balance - Comments: seated EOB, no overt LOB   Standing balance support: Single extremity supported, Bilateral upper extremity supported, During functional activity, Reliant on assistive device for balance Standing balance-Leahy Scale: Poor Standing balance comment: B UE support at RW and min A                           ADL either performed or assessed with clinical judgement   ADL Overall ADL's : Needs assistance/impaired Eating/Feeding: Minimal assistance;Sitting   Grooming: Wash/dry hands;Minimal assistance;Cueing for sequencing;Sitting   Upper Body Bathing: Moderate assistance;Sitting;Cueing for compensatory techniques;Cueing for safety   Lower Body Bathing: Moderate assistance;Cueing for sequencing;Cueing for safety;Sit to/from stand   Upper Body Dressing : Minimal assistance;Sitting;Cueing for sequencing;Cueing for safety   Lower Body Dressing: Moderate assistance;Cueing for safety;Cueing for sequencing;Sit to/from stand   Toilet  Transfer: Ambulation;Regular Toilet;Minimal assistance;Rolling walker (2 wheels);Cueing for safety;Cueing for sequencing   Toileting- Clothing Manipulation and Hygiene: Sitting/lateral lean;Moderate assistance       Functional mobility during ADLs: Rolling walker (2 wheels);Cueing for safety;Minimal assistance;Cueing for sequencing General ADL Comments: poor safety, limited functional reach     Vision Baseline Vision/History: 0 No visual deficits Ability to See in Adequate Light: 0 Adequate Patient Visual Report: No change from baseline              Pertinent Vitals/Pain Pain Assessment Pain Assessment: No/denies pain     Extremity/Trunk Assessment Upper Extremity Assessment Upper Extremity Assessment: Generalized weakness;Right hand dominant   Lower Extremity Assessment Lower Extremity Assessment: Defer to PT evaluation   Cervical / Trunk Assessment Cervical / Trunk Assessment: Kyphotic   Communication Communication Communication: Impaired Factors Affecting Communication: Difficulty expressing self (intermittent word finding difficulty)   Cognition Arousal: Alert Behavior During Therapy: WFL for tasks assessed/performed Cognition: Cognition impaired   Orientation impairments: Time, Situation, Place Awareness: Online awareness impaired, Intellectual awareness impaired Memory impairment (select all impairments): Short-term memory, Working memory Attention impairment (select first level of impairment): Focused attention Executive functioning impairment (select all impairments): Reasoning, Initiation, Organization, Sequencing, Problem solving OT - Cognition Comments: pleasant, confused, poor safety and judgement                 Following commands: Impaired Following commands impaired: Follows one step commands inconsistently, Follows one step commands with increased time     Cueing  General Comments   Cueing Techniques: Verbal cues;Gestural cues;Tactile  cues;Visual cues  son Cathlyn present, no SOB, edema noted, Tele center called to report therapy was in session           Home Living Family/patient expects to be discharged to:: Private residence Living Arrangements: Children Available Help at Discharge: Family;Available 24 hours/day Type of Home: House Home Access: Stairs to enter [employer] of Steps: 2 Entrance Stairs-Rails: Right Home Layout: One level     Bathroom Shower/Tub: Producer, Television/film/video: Standard Bathroom Accessibility: Yes   Home Equipment: Wheelchair - manual;Cane - single point;Rollator (4 wheels);Shower seat - built in   Additional Comments: Son, reports patient uses four wheel walker around the home independently. For community ambulation, she uses single point cane and hand hold assist from family. Son reports a significant decrease in safe and IND mobility and cognitive changes since CVA ~ 3 wks ago 11/25  Lives With: Other (Comment) (three sons who rotate for care)    Prior Functioning/Environment Prior Level of Function : Needs assist       Physical Assist : Mobility (physical);ADLs (physical) Mobility (physical): Gait;Stairs ADLs (physical): Bathing;IADLs Mobility Comments: rollator in home, cane for outside; requires additional assistance from sons with hand hold assist for stairs and long distance ambulation ADLs Comments: pt's 3 son rotate staying with her to help complete IADLs. She has a CNA neighbor who comes by x2/week to help with showers. Son, Garrel, reports there is someone with her 24/7 and family provides transportation as needed. Son reports pt previously had home health PT/OT/RN that was really helpful however pt was discharged from care.    OT Problem List: Decreased strength;Decreased activity tolerance;Impaired balance (sitting and/or standing);Decreased cognition   OT Treatment/Interventions: Self-care/ADL training;Therapeutic exercise;Energy conservation;DME  and/or AE instruction;Cognitive remediation/compensation;Therapeutic activities;Patient/family education;Balance training      OT Goals(Current goals can be found in the care plan section)   Acute Rehab OT Goals Patient Stated Goal: to  get more therapy OT Goal Formulation: With patient/family Time For Goal Achievement: 04/19/24 Potential to Achieve Goals: Good ADL Goals Pt Will Perform Eating: with set-up;sitting Pt Will Perform Grooming: with supervision;sitting Pt Will Perform Upper Body Bathing: with min assist;sitting Pt Will Perform Upper Body Dressing: with min assist;sitting Pt Will Transfer to Toilet: with contact guard assist;regular height toilet;ambulating Pt Will Perform Toileting - Clothing Manipulation and hygiene: with min assist;sitting/lateral leans   OT Frequency:  Min 2X/week    Co-evaluation   Reason for Co-Treatment: Complexity of the patient's impairments (multi-system involvement);Necessary to address cognition/behavior during functional activity;For patient/therapist safety PT goals addressed during session: Mobility/safety with mobility;Balance;Proper use of DME;Strengthening/ROM OT goals addressed during session: ADL's and self-care;Proper use of Adaptive equipment and DME;Strengthening/ROM      AM-PAC OT 6 Clicks Daily Activity     Outcome Measure Help from another person eating meals?: A Little Help from another person taking care of personal grooming?: A Little Help from another person toileting, which includes using toliet, bedpan, or urinal?: A Lot Help from another person bathing (including washing, rinsing, drying)?: A Lot Help from another person to put on and taking off regular upper body clothing?: A Little Help from another person to put on and taking off regular lower body clothing?: A Lot 6 Click Score: 15   End of Session Equipment Utilized During Treatment: Gait belt;Rolling walker (2 wheels) Nurse Communication: Mobility  status  Activity Tolerance: Patient tolerated treatment well Patient left: with call bell/phone within reach;in chair;with chair alarm set;with family/visitor present  OT Visit Diagnosis: Unsteadiness on feet (R26.81);History of falling (Z91.81);Muscle weakness (generalized) (M62.81);Cognitive communication deficit (R41.841)                Time: 1017-1040 OT Time Calculation (min): 23 min Charges:  OT General Charges $OT Visit: 1 Visit OT Evaluation $OT Eval Low Complexity: 1 Low  Woody Kronberg OT/L Acute Rehabilitation Department  [phone]  04/05/2024, 2:24 PM

## 2024-04-05 NOTE — Care Management Obs Status (Signed)
 MEDICARE OBSERVATION STATUS NOTIFICATION   Patient Details  Name: [name] MRN: [id number] Date of Birth: 1929/06/12   Medicare Observation Status Notification Given:  Yes    Alfonse JONELLE Rex, RN 04/05/2024, 12:56 PM

## 2024-04-05 NOTE — Consult Note (Signed)
 Palliative Care Consult Note                                  Date: 04/05/2024   Patient Name: [name]  DOB: 06/11/1929  MRN: [id number]  Age / Sex: 89 y.o., female  PCP: [name], NP Referring Physician: Rosario Leatrice FERNS, MD  Reason for Consultation: Establishing goals of care  HPI/Patient Profile: 89 y.o. female  with past medical history of hypertension, hyperlipidemia, type two diabetes mellitus with neuropathy, CKD, and previous stroke (03/18/24) admitted on 04/04/2024 with generalized weakness, poor oral intake, nausea and vomiting, confusion, visual hallucinations, and falls at home. Concern for Lewy body dementia vs vascular dementia and no new acute findings with brain MRI.    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  I introduced Palliative Medicine as specialized medical care for people living with serious illness. It focuses on providing relief from symptoms and stress of a serious illness. The goal is to improve quality of life for both the patient and the family.  Today's Discussion: Patient sitting up in chair. She is disoriented and has visual hallucinations. Her youngest son [name] is at bedside. We  discuss the patient's chronic conditions and current hospitalization. [name] shared that after speaking with Dr. Rosario today he looked up the signs/symptoms of dementia and many match his mothers symptoms. We discussed the progressive nature of dementia and that it is often accelerated and exacerbated by acute illnesses and hospitalizations.   Prior to this admission the patient was living in her home. She has three sons who alternate to provide 24/7 care. They have a NA that assists with bathing. Over the last three weeks the patient has had a significant change in her functional status. She is frequently confused and hallucinating. She had increased incontinence and several falls over the last week. Her appetite is poor. Patient retired from photographer. She has always enjoyed family time (she had 12 brothers and sisters) and [place].   [name] shared that the patient has both a HCPOA and living will. [name] shared the patient's sons make decisions together but on paper his oldest brother is proxy management consultant. I asked if family could bring in ACP documents for review and scanning into [employer]. We discussed concepts specific to code status, artifical feeding and hydration, continued IV antibiotics and rehospitalization. [name] confirmed the patient would not want an attempted resuscitation or intubation-- Patient remains DNR/DNI.  We discussed the difference between an aggressive medical intervention path and a comfort care path. The MOST form was introduced and discussed. [name] plans to discuss the MOST form with his brothers. [name] shared the patient's  sons are having a harder time managing the patient at home.  We discussed options moving forward. They are hopeful rehab will allow some meaningful improvement.   Discussed the importance of continued conversation with family and the medical providers regarding overall plan of care and treatment options, ensuring decisions are within the context of the  patient's values and GOCs.  Questions and concerns were addressed. Hard Choices booklet left for review. The family was encouraged to call with questions or concerns. PMT will continue to support holistically.  Review of Systems  Unable to perform ROS   Objective:   Primary Diagnoses: Present on Admission:  AKI (acute kidney injury)  CKD stage 3b, GFR 30-44 ml/min (HCC)  Acute kidney injury superimposed on chronic kidney disease  Hypertension  Acute metabolic encephalopathy   Physical Exam Vitals reviewed.  Constitutional:      General: She is not in acute distress. HENT:     Head: Normocephalic and atraumatic.  Cardiovascular:     Rate and Rhythm: Normal rate.  Pulmonary:     Effort: Pulmonary effort is normal.  Skin:    General: Skin is warm and dry.  Neurological:     Mental Status: She is alert. She is disoriented.  Psychiatric:        Attention and Perception: She perceives visual hallucinations.        Judgment: Judgment is impulsive.     Vital Signs:  BP (!) 168/108 (BP Location: Left Arm)   Pulse 79   Temp 98.7 F (37.1 C) (Oral)   Resp 18   Ht 5' 2 (1.575 m)   Wt 47 kg   SpO2 100%   BMI 18.95 kg/m    Advanced Care Planning:   Existing [employer]yn[employer]/ACP Documentation: None  Primary Decision Maker: [name] sons make decisions together.  Code Status/Advance Care Planning: DNR   Assessment & Plan:   SUMMARY OF RECOMMENDATIONS   DNR DNI Encouraged family to discuss MOST form and goals of care moving forward Family will likely chose rehab at discharge PMT will continue to follow   Discussed with: Dr. Rosario  Time Total: 90 minutes    Thank you for allowing us  to participate in the care of [name] PMT will continue to support holistically.   Signed by: Stephane Palin, NP Palliative Medicine Team  Team Phone # [phone] (Nights/Weekends)  04/05/2024, 2:09 PM

## 2024-04-05 NOTE — Inpatient Diabetes Management (Signed)
 Inpatient Diabetes Program Recommendations  AACE/ADA: New Consensus Statement on Inpatient Glycemic Control (2015)  Target Ranges:  Prepandial:   less than 140 mg/dL      Peak postprandial:   less than 180 mg/dL (1-2 hours)      Critically ill patients:  140 - 180 mg/dL   Lab Results  Component Value Date   GLUCAP 324 (H) 04/04/2024   HGBA1C 7.2 (H) 04/04/2024    Review of Glycemic Control  Latest Reference Range & Units 04/04/24 16:32 04/05/24 04:32  Glucose 70 - 99 mg/dL 637 (H) 768 (H)   Diabetes history: DM 2 Outpatient Diabetes medications: metformin  1000 mg bid. Basaglar  5-8 units Current orders for Inpatient glycemic control:  None  Inpatient Diabetes Program Recommendations:    -   Add CBGs and Novolog  0-6 units tid + hs scale  Thanks,  Clotilda Bull RN, MSN, BC-ADM Inpatient Diabetes Coordinator Team Pager [phone] (8a-5p)

## 2024-04-05 NOTE — Progress Notes (Signed)
 PROGRESS NOTE    [name]  FMW:[id number] DOB: [birth year] DOA: 04/04/2024 PCP: [name], NP  Outpatient Specialists:     Brief Narrative:  Patient is a 89 year old female with likely previously undiagnosed dementia, suspect Lewy body dementia (cannot rule out vascular dementia).  Other medical history includes diabetes mellitus with neuropathy, chronic back pain, vitamin D  deficiency, hypertension, hyperlipidemia, recent stroke and UTI.  Patient was admitted with generalized weakness, poor p.o. intake, nausea and vomiting and falls at home.  Patient was reported to be visually hallucinating and more confused.  Patient has baseline chronic kidney disease stage IIIb, with acute kidney injury.  Baseline serum creatinine of 1.32.  On presentation, serum creatinine was 3.6.  Patient is thought to be volume depleted as well.  Serum creatinine today is 3.13.  Overall, patient seems to be failing to thrive at home.  04/05/2024: Patient seen alongside patient's son.  Above documented history noted.  Awaiting PT OT input.  Will also consult palliative care team.  Leukocytosis is noted, however, patient has had intermittent leukocytosis.  No fever or chills.  No further nausea or vomiting.  No diarrhea.  Urine culture is nonsignificant.  MRI brain has not revealed any acute findings.  Chest x-ray is nonrevealing.   organisms.   Assessment & Plan:   Principal Problem:   AKI (acute kidney injury) Active Problems:   Hypertension   Type 2 diabetes mellitus with complication, with long-term current use of insulin  (HCC)   Acute kidney injury superimposed on chronic kidney disease   CKD stage 3b, GFR 30-44 ml/min (HCC)   Acute metabolic encephalopathy   Acute metabolic encephalopathy: Hallucinations: Possible previously undiagnosed dementia, query Lewy body dementia: Cannot rule out vascular dementia: Failure to thrive: -MRI brain has not revealed any acute findings. - Chest x-ray is  nonrevealing. - Urine culture has not shown significant findings. - The patient's son reported long-term memory loss, visual hallucinations, falls and failure to thrive. - Acute kidney injury on chronic kidney disease stage IIIb is noted. -Acute kidney injury is slowly resolving. - Chronic intermittent leukocytosis is noted.  Elevated WBC on presentation. - Blood culture did not grow any organisms. - Check procalcitonin. - Consult palliative care team. - Start Seroquel 25 mg p.o. nightly. - Awaiting PT /OT input.       AKI on CKD 3B: -Likely multifactorial. - Baseline CKD 3B. - Poor p.o. intake is reported. - Recent UTI. - Recent Bactrim  use. - AKI slowly improving. - Continue to monitor renal function and electrolytes. - Avoid nephrotoxins. - Keep MAP greater than 65 mmHg. - Dose all medications, considering eGFR of less than 10 mL/min per 1.73 m.     Diabetes mellitus type 2 with hyperglycemia: -Continue to optimize. A1c of 7.2%..     Mild elevated troponin: -Likely type II elevation.   - Advanced chronic kidney disease at baseline, with acute kidney injury is also noted.  -No chest pain or shortness of breath reported.    Mild leukocytosis: - Chronic and intermittent. - No constitutional symptoms endorsed. - Negative cultures. - Check procalcitonin.   -Could be reactive.  History of remote stroke: - MRI brain this time without any acute stroke.   -Has been declining since the stroke as per the family with hallucinations -Likely failing to thrive.   Generalized weakness, deconditioning, falls, ambulatory dysfunction: - History of falling. - Awaiting PT/OT input.   Goals of care: - Patient is DNR. - Palliative care consult to define goals  of care.      DVT prophylaxis: Subcutaneous heparin  Code Status: DO NOT RESUSCITATE.   Family Communication: Son by bedside Disposition Plan: This will depend on hospital course   Consultants:  Palliative care team  consulted.  Procedures:  None.  Antimicrobials:  None.   Subjective: No history from patient.  Objective: Vitals:   04/05/24 0042 04/05/24 0049 04/05/24 0528 04/05/24 0917  BP: (!) 169/70  (!) 163/85 (!) 186/74  Pulse: 76  79 79  Resp: 18  18 16   Temp: 98 F (36.7 C)  (!) 97.2 F (36.2 C) 98.7 F (37.1 C)  TempSrc: Oral  Oral Oral  SpO2: 99%  100% 100%  Weight:  47 kg    Height:  5' 2 (1.575 m)      Intake/Output Summary (Last 24 hours) at 04/05/2024 1246 Last data filed at 04/05/2024 1000 Gross per 24 hour  Intake 1995 ml  Output 1000 ml  Net 995 ml   Filed Weights   04/05/24 0049  Weight: 47 kg    Examination:  General exam: Appears calm and comfortable  Respiratory system: Clear to auscultation. Respiratory effort normal. Cardiovascular system: S1 & S2 heard Gastrointestinal system: Abdomen is nondistended, soft and nontender. Central nervous system: Awake and alert. . Extremities: No leg edema  Data Reviewed: I have personally reviewed following labs and imaging studies  CBC: Recent Labs  Lab 03/29/24 1740 04/04/24 1632 04/04/24 2310 04/05/24 0432  WBC 11.0* 14.3* 14.6* 13.4*  NEUTROABS 6.4 11.2*  --   --   HGB 12.5 11.9* 12.7 11.4*  HCT 37.5 34.7* 37.0 33.2*  MCV 95.4 94.8 93.7 94.3  PLT 274 270 305 263   Basic Metabolic Panel: Recent Labs  Lab 03/29/24 1740 04/04/24 1632 04/04/24 2310 04/05/24 0432  NA 140 137  --  142  K 4.3 4.0  --  3.8  CL 98 98  --  106  CO2 26 20*  --  21*  GLUCOSE 230* 362*  --  231*  BUN 29* 85*  --  76*  CREATININE 1.47* 3.60* 3.47* 3.13*  CALCIUM  10.4* 9.1  --  8.9  MG  --   --   --  2.0   GFR: Estimated Creatinine Clearance: 8.5 mL/min (A) (by C-G formula based on SCr of 3.13 mg/dL (H)). Liver Function Tests: Recent Labs  Lab 04/04/24 1632  AST 26  ALT 12  ALKPHOS 61  BILITOT 0.2  PROT 6.5  ALBUMIN 3.7   No results for input(s): LIPASE, AMYLASE in the last 168 hours. No results for  input(s): AMMONIA in the last 168 hours. Coagulation Profile: No results for input(s): INR, PROTIME in the last 168 hours. Cardiac Enzymes: No results for input(s): CKTOTAL, CKMB, CKMBINDEX, TROPONINI in the last 168 hours. BNP (last 3 results) No results for input(s): PROBNP in the last 8760 hours. HbA1C: Recent Labs    04/04/24 1632  HGBA1C 7.2*   CBG: Recent Labs  Lab 04/04/24 1630  GLUCAP 324*   Lipid Profile: No results for input(s): CHOL, HDL, LDLCALC, TRIG, CHOLHDL, LDLDIRECT in the last 72 hours. Thyroid  Function Tests: Recent Labs    04/04/24 2310  TSH 0.755   Anemia Panel: Recent Labs    04/04/24 2311  VITAMINB12 544   Urine analysis:    Component Value Date/Time   COLORURINE YELLOW 04/04/2024 1605   APPEARANCEUR HAZY (A) 04/04/2024 1605   LABSPEC 1.014 04/04/2024 1605   PHURINE 5.0 04/04/2024 1605   GLUCOSEU 150 (  A) 04/04/2024 1605   HGBUR SMALL (A) 04/04/2024 1605   BILIRUBINUR NEGATIVE 04/04/2024 1605   KETONESUR NEGATIVE 04/04/2024 1605   PROTEINUR 100 (A) 04/04/2024 1605   UROBILINOGEN 0.2 07/29/2012 1237   NITRITE NEGATIVE 04/04/2024 1605   LEUKOCYTESUR NEGATIVE 04/04/2024 1605   Sepsis Labs: @LABRCNTIP (procalcitonin:4,lacticidven:4)  ) Recent Results (from the past 240 hours)  Urine Culture     Status: Abnormal   Collection Time: 03/29/24  7:24 PM   Specimen: Urine, Clean Catch  Result Value Ref Range Status   Specimen Description   Final    URINE, CLEAN CATCH Performed at [facility], [address] [place], [place] [address]    Special Requests   Final    NONE Performed at [facility], [address] [place], [place] [address]    Culture (A)  Final    <10,000 COLONIES/mL INSIGNIFICANT GROWTH Performed at [facility], [address] [place], [place] [address]    Report Status 03/30/2024 FINAL  Final         Radiology Studies: MR BRAIN WO  CONTRAST Result Date: 04/04/2024 EXAM: MRI BRAIN WITHOUT CONTRAST 04/04/2024 05:58:25 PM TECHNIQUE: Multiplanar multisequence MRI of the head/brain was performed without the administration of intravenous contrast. COMPARISON: CT head 03/29/2024 and MRI head 03/18/2024. CLINICAL HISTORY: Neuro deficit, acute, stroke suspected. FINDINGS: BRAIN AND VENTRICLES: No acute infarct. No intracranial hemorrhage. No mass. No midline shift. No hydrocephalus. The sella is unremarkable. Normal flow voids. Mild parenchymal volume loss. Focal encephalomalacia in the right frontal periventricular white matter with surrounding gliosis suggestive of remote infarct. Additional remote lacunar infarct in the left paramedian brainstem at the junction of the midbrain and pons. Similar appearance of chronic microvascular ischemic changes. Remote lacunar infarct in the right thalamus. Chronic microhemorrhages in the right thalamus and left external capsule. Small remote infarcts in the left cerebellum. ORBITS: Bilateral lens replacement. SINUSES AND MASTOIDS: No acute abnormality. BONES AND SOFT TISSUES: Normal marrow signal. No acute soft tissue abnormality. IMPRESSION: 1. No acute intracranial abnormality. 2. Similar appearance of remote infarcts, chronic microvascular ischemic changes, and mild parenchymal volume loss. Electronically signed by: Donnice Mania MD 04/04/2024 07:43 PM EST RP Workstation: [id number]   DG Chest Port 1 View Result Date: 04/04/2024 CLINICAL DATA:  Altered mental status. EXAM: PORTABLE CHEST 1 VIEW COMPARISON:  Chest radiograph dated 03/18/2024 FINDINGS: No focal consolidation, pleural effusion or pneumothorax. The cardiac silhouette is within normal limits. Atherosclerotic calcification of the aorta. No acute osseous pathology. IMPRESSION: No active disease. Electronically Signed   By: Vanetta Chou M.D.   On: 04/04/2024 17:16        Scheduled Meds:  heparin   5,000 Units Subcutaneous Q8H   sodium  chloride flush  3 mL Intravenous Q12H   Continuous Infusions:  sodium chloride  100 mL/hr at 04/04/24 2306   sodium chloride        LOS: 0 days    Time spent: 55 minutes.    Leatrice Chapel, MD  [facility] Pager #: [phone] 7PM-7AM contact night coverage as above

## 2024-04-05 NOTE — Progress Notes (Signed)
 Pt is in bed resting. Son has left pt's room for the night.

## 2024-04-05 NOTE — Progress Notes (Signed)
° °  Inpatient Rehab Admissions Coordinator :  PT and OT recommends  < 3 hrs per day of therapy therefore SNF recommended. We will not pursue CIR at this time. Please contact me with any questions.  Heron Leavell RN MSN Admissions Coordinator [phone]

## 2024-04-05 NOTE — TOC Initial Note (Addendum)
 Transition of Care [facility]) - Initial/Assessment Note    Patient Details  Name: [name] MRN: [id number] Date of Birth: 01/09/1930  Transition of Care [facility]) CM/SW Contact:    Kelsey JONELLE Rex, RN Phone Number: 04/05/2024, 1:15 PM  Clinical Narrative:       Met with patient and her son [name] at bedside, introduced role of INPT CM/NCM and reviewed for dc planning, patient's son [name] answered assessment questions. [name] reports patient resides in a private residence with support from him and his other brother, reports at baseline patient is independent with ADL's and self care. [name] reports patient had a recent CVA and HH PT/OT/RN was arranged with [facility] but patient admitted to hospital prior to initiation of services. PT eval pending, await recommendation. MOON completed with [name]. INPT CM will continue to follow.      -2;37pm PT eval completed, recommendation CIR, await consult.          Expected Discharge Plan: Home w Home Health Services Barriers to Discharge: Continued Medical Work up   Patient Goals and CMS Choice Patient states their goals for this hospitalization and ongoing recovery are:: return home          Expected Discharge Plan and Services       Living arrangements for the past 2 months: Single Family Home                                      Prior Living Arrangements/Services Living arrangements for the past 2 months: Single Family Home Lives with:: Self Patient language and need for interpreter reviewed:: Yes Do you feel safe going back to the place where you live?: Yes      Need for Family Participation in Patient Care: Yes (Comment) Care giver support system in place?: Yes (comment) Current home services: DME (RW) Criminal Activity/Legal Involvement Pertinent to Current Situation/Hospitalization: No - Comment as needed  Activities of Daily Living   ADL Screening (condition at time of admission) Independently performs ADLs?: No Does  the patient have a NEW difficulty with bathing/dressing/toileting/self-feeding that is expected to last >3 days?: No Does the patient have a NEW difficulty with getting in/out of bed, walking, or climbing stairs that is expected to last >3 days?: No Does the patient have a NEW difficulty with communication that is expected to last >3 days?: No Is the patient deaf or have difficulty hearing?: No Does the patient have difficulty seeing, even when wearing glasses/contacts?: No Does the patient have difficulty concentrating, remembering, or making decisions?: No  Permission Sought/Granted                  Emotional Assessment Appearance:: Appears stated age Attitude/Demeanor/Rapport: Unable to Assess Affect (typically observed): Unable to Assess Orientation: : Oriented to Self Alcohol / Substance Use: Not Applicable Psych Involvement: No (comment)  Admission diagnosis:  Hyperglycemia [R73.9] AKI (acute kidney injury) [N17.9] Patient Active Problem List   Diagnosis Date Noted   AKI (acute kidney injury) 04/04/2024   Acute metabolic encephalopathy 03/18/2024   CKD stage 3b, GFR 30-44 ml/min (HCC) 10/12/2023   Hypercalcemia 10/12/2023   Sepsis secondary to UTI (HCC) 10/12/2023   Lactic acidosis 10/12/2023   UTI (urinary tract infection) 11/02/2022   Acute kidney injury superimposed on chronic kidney disease 11/02/2022   Frequent UTI 02/10/2020   Advanced nonexudative age-related macular degeneration of left eye with subfoveal involvement 01/08/2020  Memory changes 12/31/2019   Poorly controlled type 2 diabetes mellitus (HCC) 12/31/2019   Poor compliance with medication 12/31/2019   Advanced nonexudative age-related macular degeneration of right eye with subfoveal involvement 11/18/2019   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) 11/18/2019   Exudative age-related macular degeneration of right eye with active choroidal  neovascularization (HCC) 08/19/2019   Exudative age-related macular degeneration of left eye with active choroidal neovascularization (HCC) 08/19/2019   Long term (current) use of insulin  (HCC) 06/10/2019   Major depression, recurrent 12/20/2018   Type 2 diabetes mellitus with complication, with long-term current use of insulin  (HCC) 02/09/2017   Atherosclerosis of aorta (HCC) by AbdCTscan on 06/28/2017 11/06/2016   Encounter for Medicare annual wellness exam 05/18/2015   Medication management 08/06/2013   CKD stage 3 due to type 2 diabetes mellitus (HCC)    Hyperlipidemia associated with type 2 diabetes mellitus (HCC)    Vitamin D  deficiency    Hypertension 07/29/2012   Chronic pain 07/29/2012   PCP:  [name], NP Pharmacy:   [id number] - [place] ([place]), [place] - [address] [place] ([place]) [place] [address] Phone: [phone] Fax: [phone]     Social Drivers of Health (SDOH) Social History: SDOH Screenings   Food Insecurity: No Food Insecurity (04/05/2024)  Housing: Low Risk (04/05/2024)  Transportation Needs: Patient Unable To Answer (04/05/2024)  Utilities: Patient Unable To Answer (04/05/2024)  Depression (PHQ2-9): Low Risk (09/22/2022)  Financial Resource Strain: Low Risk (12/29/2023)   Received from [facility]  Social Connections: Moderately Integrated (04/05/2024)  Tobacco Use: Low Risk (04/04/2024)   SDOH Interventions:     Readmission Risk Interventions    11/03/2022    1:11 PM  Readmission Risk Prevention Plan  Post Dischage Appt Complete  Medication Screening Complete  Transportation Screening Complete

## 2024-04-06 DIAGNOSIS — I700 Atherosclerosis of aorta: Secondary | ICD-10-CM | POA: Diagnosis present

## 2024-04-06 DIAGNOSIS — E1122 Type 2 diabetes mellitus with diabetic chronic kidney disease: Secondary | ICD-10-CM | POA: Diagnosis present

## 2024-04-06 DIAGNOSIS — I129 Hypertensive chronic kidney disease with stage 1 through stage 4 chronic kidney disease, or unspecified chronic kidney disease: Secondary | ICD-10-CM | POA: Diagnosis present

## 2024-04-06 DIAGNOSIS — E860 Dehydration: Secondary | ICD-10-CM | POA: Diagnosis present

## 2024-04-06 DIAGNOSIS — M6281 Muscle weakness (generalized): Secondary | ICD-10-CM | POA: Diagnosis not present

## 2024-04-06 DIAGNOSIS — S0003XA Contusion of scalp, initial encounter: Secondary | ICD-10-CM | POA: Diagnosis not present

## 2024-04-06 DIAGNOSIS — R112 Nausea with vomiting, unspecified: Secondary | ICD-10-CM

## 2024-04-06 DIAGNOSIS — Z7984 Long term (current) use of oral hypoglycemic drugs: Secondary | ICD-10-CM | POA: Diagnosis not present

## 2024-04-06 DIAGNOSIS — R296 Repeated falls: Secondary | ICD-10-CM | POA: Diagnosis present

## 2024-04-06 DIAGNOSIS — Z751 Person awaiting admission to adequate facility elsewhere: Secondary | ICD-10-CM | POA: Diagnosis not present

## 2024-04-06 DIAGNOSIS — I21A1 Myocardial infarction type 2: Secondary | ICD-10-CM | POA: Diagnosis present

## 2024-04-06 DIAGNOSIS — D62 Acute posthemorrhagic anemia: Secondary | ICD-10-CM | POA: Diagnosis not present

## 2024-04-06 DIAGNOSIS — E1140 Type 2 diabetes mellitus with diabetic neuropathy, unspecified: Secondary | ICD-10-CM | POA: Diagnosis present

## 2024-04-06 DIAGNOSIS — Z66 Do not resuscitate: Secondary | ICD-10-CM | POA: Diagnosis present

## 2024-04-06 DIAGNOSIS — E7800 Pure hypercholesterolemia, unspecified: Secondary | ICD-10-CM | POA: Diagnosis present

## 2024-04-06 DIAGNOSIS — Z7189 Other specified counseling: Secondary | ICD-10-CM | POA: Diagnosis not present

## 2024-04-06 DIAGNOSIS — W06XXXA Fall from bed, initial encounter: Secondary | ICD-10-CM | POA: Diagnosis not present

## 2024-04-06 DIAGNOSIS — W19XXXD Unspecified fall, subsequent encounter: Secondary | ICD-10-CM | POA: Diagnosis not present

## 2024-04-06 DIAGNOSIS — Z681 Body mass index (BMI) 19 or less, adult: Secondary | ICD-10-CM | POA: Diagnosis not present

## 2024-04-06 DIAGNOSIS — N1832 Chronic kidney disease, stage 3b: Secondary | ICD-10-CM | POA: Diagnosis present

## 2024-04-06 DIAGNOSIS — E1165 Type 2 diabetes mellitus with hyperglycemia: Secondary | ICD-10-CM | POA: Diagnosis present

## 2024-04-06 DIAGNOSIS — Z881 Allergy status to other antibiotic agents status: Secondary | ICD-10-CM | POA: Diagnosis not present

## 2024-04-06 DIAGNOSIS — S7002XA Contusion of left hip, initial encounter: Secondary | ICD-10-CM | POA: Diagnosis not present

## 2024-04-06 DIAGNOSIS — Z8673 Personal history of transient ischemic attack (TIA), and cerebral infarction without residual deficits: Secondary | ICD-10-CM | POA: Diagnosis not present

## 2024-04-06 DIAGNOSIS — N189 Chronic kidney disease, unspecified: Secondary | ICD-10-CM | POA: Diagnosis not present

## 2024-04-06 DIAGNOSIS — N179 Acute kidney failure, unspecified: Secondary | ICD-10-CM | POA: Diagnosis present

## 2024-04-06 DIAGNOSIS — R739 Hyperglycemia, unspecified: Secondary | ICD-10-CM | POA: Diagnosis present

## 2024-04-06 DIAGNOSIS — R441 Visual hallucinations: Secondary | ICD-10-CM | POA: Diagnosis present

## 2024-04-06 DIAGNOSIS — Z515 Encounter for palliative care: Secondary | ICD-10-CM | POA: Diagnosis not present

## 2024-04-06 DIAGNOSIS — Z7902 Long term (current) use of antithrombotics/antiplatelets: Secondary | ICD-10-CM | POA: Diagnosis not present

## 2024-04-06 DIAGNOSIS — R627 Adult failure to thrive: Secondary | ICD-10-CM | POA: Diagnosis present

## 2024-04-06 DIAGNOSIS — R410 Disorientation, unspecified: Secondary | ICD-10-CM | POA: Diagnosis not present

## 2024-04-06 DIAGNOSIS — G9341 Metabolic encephalopathy: Secondary | ICD-10-CM | POA: Diagnosis present

## 2024-04-06 DIAGNOSIS — E440 Moderate protein-calorie malnutrition: Secondary | ICD-10-CM | POA: Diagnosis present

## 2024-04-06 LAB — RENAL FUNCTION PANEL
Albumin: 3.4 g/dL — ABNORMAL LOW (ref 3.5–5.0)
Anion gap: 14 (ref 5–15)
BUN: 48 mg/dL — ABNORMAL HIGH (ref 8–23)
CO2: 19 mmol/L — ABNORMAL LOW (ref 22–32)
Calcium: 8.1 mg/dL — ABNORMAL LOW (ref 8.9–10.3)
Chloride: 108 mmol/L (ref 98–111)
Creatinine, Ser: 2.3 mg/dL — ABNORMAL HIGH (ref 0.44–1.00)
GFR, Estimated: 19 mL/min — ABNORMAL LOW (ref >60)
Glucose, Bld: 179 mg/dL — ABNORMAL HIGH (ref 70–99)
Phosphorus: 2.2 mg/dL — ABNORMAL LOW (ref 2.5–4.6)
Potassium: 3.5 mmol/L (ref 3.5–5.1)
Sodium: 141 mmol/L (ref 135–145)

## 2024-04-06 LAB — CBC WITH DIFFERENTIAL/PLATELET
Abs Immature Granulocytes: 0.04 K/uL (ref 0.00–0.07)
Basophils Absolute: 0 K/uL (ref 0.0–0.1)
Basophils Relative: 0 %
Eosinophils Absolute: 0 K/uL (ref 0.0–0.5)
Eosinophils Relative: 0 %
HCT: 30.4 % — ABNORMAL LOW (ref 36.0–46.0)
Hemoglobin: 10.6 g/dL — ABNORMAL LOW (ref 12.0–15.0)
Immature Granulocytes: 0 %
Lymphocytes Relative: 16 %
Lymphs Abs: 2.1 K/uL (ref 0.7–4.0)
MCH: 32 pg (ref 26.0–34.0)
MCHC: 34.9 g/dL (ref 30.0–36.0)
MCV: 91.8 fL (ref 80.0–100.0)
Monocytes Absolute: 1.1 K/uL — ABNORMAL HIGH (ref 0.1–1.0)
Monocytes Relative: 9 %
Neutro Abs: 9.5 K/uL — ABNORMAL HIGH (ref 1.7–7.7)
Neutrophils Relative %: 75 %
Platelets: 247 K/uL (ref 150–400)
RBC: 3.31 MIL/uL — ABNORMAL LOW (ref 3.87–5.11)
RDW: 13.1 % (ref 11.5–15.5)
WBC: 12.8 K/uL — ABNORMAL HIGH (ref 4.0–10.5)
nRBC: 0 % (ref 0.0–0.2)

## 2024-04-06 MED ORDER — K PHOS MONO-SOD PHOS DI & MONO 155-852-130 MG PO TABS
250.0000 mg | ORAL_TABLET | Freq: Four times a day (QID) | ORAL | Status: AC
  Administered 2024-04-06 – 2024-04-07 (×4): 250 mg via ORAL
  Filled 2024-04-06 (×4): qty 1

## 2024-04-06 MED ORDER — POTASSIUM CHLORIDE 20 MEQ PO PACK
40.0000 meq | PACK | Freq: Once | ORAL | Status: AC
  Administered 2024-04-06: 40 meq via ORAL
  Filled 2024-04-06: qty 2

## 2024-04-06 NOTE — Plan of Care (Signed)

## 2024-04-06 NOTE — Progress Notes (Signed)
 Daily Progress Note   Patient Name: [name]       Date: 04/06/2024 DOB: May 30, 1929  Age: 89 y.o. MRN#: [id number] Attending Physician: Rosario Leatrice FERNS, MD Primary Care Physician: [name], NP Admit Date: 04/04/2024  Reason for Consultation/Follow-up: Establishing goals of care  Length of Stay: 0  Current Medications: Scheduled Meds:   heparin   5,000 Units Subcutaneous Q8H   QUEtiapine   25 mg Oral QHS   sodium chloride  flush  3 mL Intravenous Q12H    Continuous Infusions:   PRN Meds: acetaminophen  **OR** acetaminophen , hydrALAZINE , LORazepam , ondansetron  **OR** ondansetron  (ZOFRAN ) IV, polyethylene glycol, sodium chloride  flush  Physical Exam Vitals reviewed.  Constitutional:      General: She is sleeping. She is not in acute distress. HENT:     Head: Normocephalic and atraumatic.  Cardiovascular:     Rate and Rhythm: Normal rate.  Pulmonary:     Effort: Pulmonary effort is normal.  Skin:    General: Skin is dry.             Vital Signs: BP (!) 164/74 (BP Location: Right Arm)   Pulse 88   Temp 98.2 F (36.8 C)   Resp 16   Ht 5' 2 (1.575 m)   Wt 47.7 kg   SpO2 100%   BMI 19.23 kg/m  SpO2: SpO2: 100 % O2 Device: O2 Device: Room Air O2 Flow Rate:       Patient Active Problem List   Diagnosis Date Noted   AKI (acute kidney injury) 04/04/2024   Acute metabolic encephalopathy 03/18/2024   CKD stage 3b, GFR 30-44 ml/min (HCC) 10/12/2023   Hypercalcemia 10/12/2023   Sepsis secondary to UTI (HCC) 10/12/2023   Lactic acidosis 10/12/2023   UTI (urinary tract infection) 11/02/2022   Acute kidney injury superimposed on chronic kidney disease 11/02/2022   Frequent UTI 02/10/2020   Advanced nonexudative age-related macular degeneration of left eye  with subfoveal involvement 01/08/2020   Memory changes 12/31/2019   Poorly controlled type 2 diabetes mellitus (HCC) 12/31/2019   Poor compliance with medication 12/31/2019   Advanced nonexudative age-related macular degeneration of right eye with subfoveal involvement 11/18/2019   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) 11/18/2019   Exudative age-related macular degeneration of right eye with active choroidal neovascularization (  HCC) 08/19/2019   Exudative age-related macular degeneration of left eye with active choroidal neovascularization (HCC) 08/19/2019   Long term (current) use of insulin  (HCC) 06/10/2019   Major depression, recurrent 12/20/2018   Type 2 diabetes mellitus with complication, with long-term current use of insulin  (HCC) 02/09/2017   Atherosclerosis of aorta (HCC) by AbdCTscan on 06/28/2017 11/06/2016   Encounter for Medicare annual wellness exam 05/18/2015   Medication management 08/06/2013   CKD stage 3 due to type 2 diabetes mellitus (HCC)    Hyperlipidemia associated with type 2 diabetes mellitus (HCC)    Vitamin D  deficiency    Hypertension 07/29/2012   Chronic pain 07/29/2012    Palliative Care Assessment & Plan   Patient Profile: 89 y.o. female  with past medical history of hypertension, hyperlipidemia, type two diabetes mellitus with neuropathy, CKD, and previous stroke (03/18/24) admitted on 04/04/2024 with generalized weakness, poor oral intake, nausea and vomiting, confusion, visual hallucinations, and falls at home. Concern for Lewy body dementia vs vascular dementia and no new acute findings with brain MRI.   Today's Discussion: Reviewed chart. Patient was evaluated by PT and OT yesterday. Recommend < 3 hours/day of therapy so SNF would be appropriate. Patient worked with mobility specialist this morning. She is in the chair asleep when I visit. She did not easily awaken- so I let her sleep. No  family at bedside.   Called patient's son Mignonne Afonso. He is on his way to the hospital. We plan to meet late this morning.  Met with patient's son Medford (and spoke with son Ozell by phone). Patient is still sleeping. The patient's sons discussed MOST form last night. MOST form completed today. The family outlined their wishes for the following treatment decisions:  Cardiopulmonary Resuscitation: Do Not Attempt Resuscitation (DNR/No CPR)  Medical Interventions: Limited Additional Interventions: Use medical treatment, IV fluids and cardiac monitoring as indicated, DO NOT USE intubation or mechanical ventilation. May consider use of less invasive airway support such as BiPAP or CPAP. Also provide comfort measures. Transfer to the hospital if indicated. Avoid intensive care.   Antibiotics: Antibiotics if indicated  IV Fluids: IV fluids if indicated  Feeding Tube: No feeding tube   [name] sons would like her to go to SNF for rehab.  Discussed the importance of continued conversation with family and the medical providers regarding overall plan of care and treatment options, ensuring decisions are within the context of the patient's values and GOCs.  Recommendations/Plan: DNR DNI MOST form completed- placed in paper chart and will be scanned into [employer]. Likely SNF with rehab at discharge PMT will follow peripherally    Code Status:    Code Status Orders  (From admission, onward)           Start     Ordered   04/04/24 2027  Do not attempt resuscitation (DNR)- Limited -Do Not Intubate (DNI)  Continuous       Question Answer Comment  If pulseless and not breathing No CPR or chest compressions.   In Pre-Arrest Conditions (Patient Is Breathing and Has A Pulse) Do not intubate. Provide all appropriate non-invasive medical interventions. Avoid ICU transfer unless indicated or required.   Consent: Discussion documented in EHR or advanced directives reviewed      04/04/24 2027          Extensive chart review has been completed prior to seeing the patient including labs, vital signs, imaging, progress/consult notes, orders, medications, and available advance directive documents.  Care plan  was discussed with bedside RN and Dr. Rosario  Time spent: 45 minutes  Thank you for allowing the Palliative Medicine Team to assist in the care of this patient.    Stephane CHRISTELLA Palin, NP  Please contact Palliative Medicine Team phone at [phone] for questions and concerns.

## 2024-04-06 NOTE — Progress Notes (Signed)
 Mobility Specialist Progress Note:   04/06/24 1147  Mobility  Activity Pivoted/transferred from chair to bed  Level of Assistance Minimal assist, patient does 75% or more  Assistive Device  (HHA)  Distance Ambulated (ft) 2 ft  Activity Response Tolerated well  Mobility Referral Yes  Mobility visit 1 Mobility  Mobility Specialist Start Time (ACUTE ONLY) 1124  Mobility Specialist Stop Time (ACUTE ONLY) 1132  Mobility Specialist Time Calculation (min) (ACUTE ONLY) 8 min   Pt was received in recliner, xfer back to bed. Min A sit to stand. Returned to bed with all needs met. Call bell in reach. Left in room with family.  [employer] - Mobility Specialist

## 2024-04-06 NOTE — Progress Notes (Signed)
 Mobility Specialist Progress Note:   04/06/24 0939  Mobility  Activity Pivoted/transferred from bed to chair  Level of Assistance Minimal assist, patient does 75% or more  Assistive Device Front wheel walker  Distance Ambulated (ft) 2 ft  Activity Response Tolerated well  Mobility Referral Yes  Mobility visit 1 Mobility  Mobility Specialist Start Time (ACUTE ONLY) [id number]  Mobility Specialist Stop Time (ACUTE ONLY) 0930  Mobility Specialist Time Calculation (min) (ACUTE ONLY) 14 min   Pt was received in bed and agreed to mobilize to recliner. Pt stated being lightheaded at the edge of bed but quickly subsided. Min A bed mobility and sit to stand. Returned to recliner with all needs met. Call bell in reach.  [employer] - Mobility Specialist

## 2024-04-06 NOTE — Progress Notes (Signed)
 PROGRESS NOTE    [name]  FMW:[id number] DOB: Sep 02, 1929 DOA: 04/04/2024 PCP: [name], NP  Outpatient Specialists:     Brief Narrative:  Patient is a 89 year old female with likely previously undiagnosed dementia, suspect Lewy body dementia (cannot rule out vascular dementia).  Other medical history includes diabetes mellitus with neuropathy, chronic back pain, vitamin D  deficiency, hypertension, hyperlipidemia, recent stroke and UTI.  Patient was admitted with generalized weakness, poor p.o. intake, nausea and vomiting and falls at home.  Patient was reported to be visually hallucinating and more confused.  Patient has baseline chronic kidney disease stage IIIb, with acute kidney injury.  Baseline serum creatinine of 1.32.  On presentation, serum creatinine was 3.6.  Patient is thought to be volume depleted as well.  Serum creatinine today is 3.13.  Overall, patient seems to be failing to thrive at home.  04/05/2024: Patient seen alongside patient's son.  Above documented history noted.  Awaiting PT OT input.  Will also consult palliative care team.  Leukocytosis is noted, however, patient has had intermittent leukocytosis.  No fever or chills.  No further nausea or vomiting.  No diarrhea.  Urine culture is nonsignificant.  MRI brain has not revealed any acute findings.  Chest x-ray is nonrevealing.  04/06/2024: Patient seen alongside patient's nurse and granddaughter.  Patient looks a lot better today.  No hallucinations reported.   Assessment & Plan:   Principal Problem:   AKI (acute kidney injury) Active Problems:   Hypertension   Type 2 diabetes mellitus with complication, with long-term current use of insulin  (HCC)   Acute kidney injury superimposed on chronic kidney disease   CKD stage 3b, GFR 30-44 ml/min (HCC)   Acute metabolic encephalopathy   Acute metabolic encephalopathy: Hallucinations: Possible previously undiagnosed dementia, query Lewy body  dementia: Cannot rule out vascular dementia: Failure to thrive: -MRI brain did not reveal any acute findings. - Chest x-ray was nonrevealing. - Urine culture was not significant.   - The patient's son reported long-term memory loss, visual hallucinations, falls and failure to thrive. - Acute kidney injury on chronic kidney disease stage IIIb is noted. -Acute kidney injury is slowly resolving.  Serum creatinine of 2.3 today (down from 3.13).   - Mild improvement in leukocytosis.  Chronic intermittent leukocytosis is noted.  Elevated WBC on presentation. - Blood culture did not grow any organisms. - Procalcitonin of 0.12.   - Palliative care input is appreciated.   - Continue Seroquel  25 mg p.o. nightly.  Continue to monitor for side effects closely. - PT /OT input is appreciated, and SNF is recommended.    AKI on CKD 3B: -Likely multifactorial. - Baseline CKD 3B. - Poor p.o. intake is reported. - Recent UTI. - Recent Bactrim  use. - AKI is slowly improving. - Continue to monitor renal function and electrolytes. - Avoid nephrotoxins. - Keep MAP greater than 65 mmHg. - Dose all medications, considering eGFR of less than 10 mL/min per 1.73 m.     Diabetes mellitus type 2 with hyperglycemia: -Continue to optimize. A1c of 7.2%..     Mild elevated troponin: -Likely type II elevation.   - Advanced chronic kidney disease at baseline, with acute kidney injury is also noted.  -No chest pain or shortness of breath reported.    Mild leukocytosis: - Chronic and intermittent. - No constitutional symptoms endorsed. - Negative cultures. - Procalcitonin of 0.12.      History of remote stroke: - MRI brain this time without any acute stroke.   -  Has been declining since the stroke as per the family with hallucinations -Likely failing to thrive.   Generalized weakness, deconditioning, falls, ambulatory dysfunction: - History of falling. - PT/OT input.  - SNF recommended.   - Consult TOC.     Goals of care: - Patient is DNR. - Palliative care consult to define goals of care.      DVT prophylaxis: Subcutaneous heparin  Code Status: DO NOT RESUSCITATE.   Family Communication: Son by bedside Disposition Plan: This will depend on hospital course   Consultants:  Palliative care team consulted.  Procedures:  None.  Antimicrobials:  None.   Subjective: No history from patient.  Objective: Vitals:   04/06/24 0500 04/06/24 0534 04/06/24 0642 04/06/24 1203  BP:  (!) 175/78 (!) 164/74 (!) 159/77  Pulse:  84 88 87  Resp:  16 16 14   Temp:  98.1 F (36.7 C) 98.2 F (36.8 C) 97.7 F (36.5 C)  TempSrc:  Oral    SpO2:  98% 100% 99%  Weight: 47.7 kg     Height:        Intake/Output Summary (Last 24 hours) at 04/06/2024 1335 Last data filed at 04/06/2024 0600 Gross per 24 hour  Intake 965 ml  Output 1700 ml  Net -735 ml   Filed Weights   04/05/24 0049 04/06/24 0500  Weight: 47 kg 47.7 kg    Examination:  General exam: Appears calm and comfortable.  Patient is more interactive today. Respiratory system: Clear to auscultation. Respiratory effort normal. Cardiovascular system: S1 & S2 heard Gastrointestinal system: Abdomen is nondistended, soft and nontender. Central nervous system: Awake and alert. . Extremities: No leg edema  Data Reviewed: I have personally reviewed following labs and imaging studies  CBC: Recent Labs  Lab 04/04/24 1632 04/04/24 2310 04/05/24 0432 04/06/24 0459  WBC 14.3* 14.6* 13.4* 12.8*  NEUTROABS 11.2*  --   --  9.5*  HGB 11.9* 12.7 11.4* 10.6*  HCT 34.7* 37.0 33.2* 30.4*  MCV 94.8 93.7 94.3 91.8  PLT 270 305 263 247   Basic Metabolic Panel: Recent Labs  Lab 04/04/24 1632 04/04/24 2310 04/05/24 0432 04/06/24 0459  NA 137  --  142 141  K 4.0  --  3.8 3.5  CL 98  --  106 108  CO2 20*  --  21* 19*  GLUCOSE 362*  --  231* 179*  BUN 85*  --  76* 48*  CREATININE 3.60* 3.47* 3.13* 2.30*  CALCIUM  9.1  --  8.9 8.1*   MG  --   --  2.0  --   PHOS  --   --   --  2.2*   GFR: Estimated Creatinine Clearance: 11.8 mL/min (A) (by C-G formula based on SCr of 2.3 mg/dL (H)). Liver Function Tests: Recent Labs  Lab 04/04/24 1632 04/06/24 0459  AST 26  --   ALT 12  --   ALKPHOS 61  --   BILITOT 0.2  --   PROT 6.5  --   ALBUMIN 3.7 3.4*   No results for input(s): LIPASE, AMYLASE in the last 168 hours. No results for input(s): AMMONIA in the last 168 hours. Coagulation Profile: No results for input(s): INR, PROTIME in the last 168 hours. Cardiac Enzymes: No results for input(s): CKTOTAL, CKMB, CKMBINDEX, TROPONINI in the last 168 hours. BNP (last 3 results) No results for input(s): PROBNP in the last 8760 hours. HbA1C: Recent Labs    04/04/24 1632  HGBA1C 7.2*   CBG: Recent Labs  Lab 04/04/24 1630  GLUCAP 324*   Lipid Profile: No results for input(s): CHOL, HDL, LDLCALC, TRIG, CHOLHDL, LDLDIRECT in the last 72 hours. Thyroid  Function Tests: Recent Labs    04/04/24 2310  TSH 0.755   Anemia Panel: Recent Labs    04/04/24 2311  VITAMINB12 544   Urine analysis:    Component Value Date/Time   COLORURINE YELLOW 04/04/2024 1605   APPEARANCEUR HAZY (A) 04/04/2024 1605   LABSPEC 1.014 04/04/2024 1605   PHURINE 5.0 04/04/2024 1605   GLUCOSEU 150 (A) 04/04/2024 1605   HGBUR SMALL (A) 04/04/2024 1605   BILIRUBINUR NEGATIVE 04/04/2024 1605   KETONESUR NEGATIVE 04/04/2024 1605   PROTEINUR 100 (A) 04/04/2024 1605   UROBILINOGEN 0.2 07/29/2012 1237   NITRITE NEGATIVE 04/04/2024 1605   LEUKOCYTESUR NEGATIVE 04/04/2024 1605   Sepsis Labs: @LABRCNTIP (procalcitonin:4,lacticidven:4)  ) Recent Results (from the past 240 hours)  Urine Culture     Status: Abnormal   Collection Time: 03/29/24  7:24 PM   Specimen: Urine, Clean Catch  Result Value Ref Range Status   Specimen Description   Final    URINE, CLEAN CATCH Performed at [facility],  [address] [place], [place] [address]    Special Requests   Final    NONE Performed at [facility], [address] [place], [place] [address]    Culture (A)  Final    <10,000 COLONIES/mL INSIGNIFICANT GROWTH Performed at [facility], [address], [place], [place] [address]    Report Status 03/30/2024 FINAL  Final         Radiology Studies: MR BRAIN WO CONTRAST Result Date: 04/04/2024 EXAM: MRI BRAIN WITHOUT CONTRAST 04/04/2024 05:58:25 PM TECHNIQUE: Multiplanar multisequence MRI of the head/brain was performed without the administration of intravenous contrast. COMPARISON: CT head 03/29/2024 and MRI head 03/18/2024. CLINICAL HISTORY: Neuro deficit, acute, stroke suspected. FINDINGS: BRAIN AND VENTRICLES: No acute infarct. No intracranial hemorrhage. No mass. No midline shift. No hydrocephalus. The sella is unremarkable. Normal flow voids. Mild parenchymal volume loss. Focal encephalomalacia in the right frontal periventricular white matter with surrounding gliosis suggestive of remote infarct. Additional remote lacunar infarct in the left paramedian brainstem at the junction of the midbrain and pons. Similar appearance of chronic microvascular ischemic changes. Remote lacunar infarct in the right thalamus. Chronic microhemorrhages in the right thalamus and left external capsule. Small remote infarcts in the left cerebellum. ORBITS: Bilateral lens replacement. SINUSES AND MASTOIDS: No acute abnormality. BONES AND SOFT TISSUES: Normal marrow signal. No acute soft tissue abnormality. IMPRESSION: 1. No acute intracranial abnormality. 2. Similar appearance of remote infarcts, chronic microvascular ischemic changes, and mild parenchymal volume loss. Electronically signed by: Donnice Mania MD 04/04/2024 07:43 PM EST RP Workstation: [id number]   DG Chest Port 1 View Result Date: 04/04/2024 CLINICAL DATA:  Altered mental status. EXAM: PORTABLE CHEST 1 VIEW  COMPARISON:  Chest radiograph dated 03/18/2024 FINDINGS: No focal consolidation, pleural effusion or pneumothorax. The cardiac silhouette is within normal limits. Atherosclerotic calcification of the aorta. No acute osseous pathology. IMPRESSION: No active disease. Electronically Signed   By: Vanetta Chou M.D.   On: 04/04/2024 17:16        Scheduled Meds:  heparin   5,000 Units Subcutaneous Q8H   QUEtiapine   25 mg Oral QHS   sodium chloride  flush  3 mL Intravenous Q12H   Continuous Infusions:     LOS: 0 days    Time spent: 55 minutes.    Leatrice Chapel, MD  [facility] Pager #: [phone] 7PM-7AM contact night coverage as above

## 2024-04-07 LAB — CBC WITH DIFFERENTIAL/PLATELET
Abs Immature Granulocytes: 0.05 K/uL (ref 0.00–0.07)
Basophils Absolute: 0 K/uL (ref 0.0–0.1)
Basophils Relative: 0 %
Eosinophils Absolute: 0 K/uL (ref 0.0–0.5)
Eosinophils Relative: 0 %
HCT: 31.7 % — ABNORMAL LOW (ref 36.0–46.0)
Hemoglobin: 11.1 g/dL — ABNORMAL LOW (ref 12.0–15.0)
Immature Granulocytes: 0 %
Lymphocytes Relative: 19 %
Lymphs Abs: 2.5 K/uL (ref 0.7–4.0)
MCH: 32.3 pg (ref 26.0–34.0)
MCHC: 35 g/dL (ref 30.0–36.0)
MCV: 92.2 fL (ref 80.0–100.0)
Monocytes Absolute: 1 K/uL (ref 0.1–1.0)
Monocytes Relative: 8 %
Neutro Abs: 9.9 K/uL — ABNORMAL HIGH (ref 1.7–7.7)
Neutrophils Relative %: 73 %
Platelets: 252 K/uL (ref 150–400)
RBC: 3.44 MIL/uL — ABNORMAL LOW (ref 3.87–5.11)
RDW: 13.3 % (ref 11.5–15.5)
WBC: 13.5 K/uL — ABNORMAL HIGH (ref 4.0–10.5)
nRBC: 0 % (ref 0.0–0.2)

## 2024-04-07 LAB — RENAL FUNCTION PANEL
Albumin: 3.5 g/dL (ref 3.5–5.0)
Anion gap: 18 — ABNORMAL HIGH (ref 5–15)
BUN: 40 mg/dL — ABNORMAL HIGH (ref 8–23)
CO2: 16 mmol/L — ABNORMAL LOW (ref 22–32)
Calcium: 8.3 mg/dL — ABNORMAL LOW (ref 8.9–10.3)
Chloride: 105 mmol/L (ref 98–111)
Creatinine, Ser: 1.93 mg/dL — ABNORMAL HIGH (ref 0.44–1.00)
GFR, Estimated: 24 mL/min — ABNORMAL LOW (ref >60)
Glucose, Bld: 211 mg/dL — ABNORMAL HIGH (ref 70–99)
Phosphorus: 3.5 mg/dL (ref 2.5–4.6)
Potassium: 3.2 mmol/L — ABNORMAL LOW (ref 3.5–5.1)
Sodium: 139 mmol/L (ref 135–145)

## 2024-04-07 LAB — MAGNESIUM: Magnesium: 1.4 mg/dL — ABNORMAL LOW (ref 1.7–2.4)

## 2024-04-07 MED ORDER — POTASSIUM CHLORIDE IN NACL 20-0.9 MEQ/L-% IV SOLN
INTRAVENOUS | Status: AC
  Filled 2024-04-07: qty 1000

## 2024-04-07 MED ORDER — OXYCODONE HCL 5 MG PO TABS
5.0000 mg | ORAL_TABLET | Freq: Four times a day (QID) | ORAL | Status: AC | PRN
  Administered 2024-04-08 – 2024-04-11 (×6): 5 mg via ORAL
  Filled 2024-04-07 (×6): qty 1

## 2024-04-07 MED ORDER — MAGNESIUM SULFATE 2 GM/50ML IV SOLN
2.0000 g | Freq: Once | INTRAVENOUS | Status: AC
  Administered 2024-04-07: 2 g via INTRAVENOUS
  Filled 2024-04-07: qty 50

## 2024-04-07 MED ORDER — QUETIAPINE FUMARATE 25 MG PO TABS
12.5000 mg | ORAL_TABLET | Freq: Every day | ORAL | Status: DC
  Administered 2024-04-07 – 2024-04-11 (×5): 12.5 mg via ORAL
  Filled 2024-04-07 (×5): qty 1

## 2024-04-07 MED ORDER — POTASSIUM CHLORIDE 20 MEQ PO PACK
40.0000 meq | PACK | Freq: Once | ORAL | Status: AC
  Administered 2024-04-07: 40 meq via ORAL
  Filled 2024-04-07: qty 2

## 2024-04-07 NOTE — Plan of Care (Signed)
   Problem: Education: Goal: Knowledge of General Education information will improve Description: Including pain rating scale, medication(s)/side effects and non-pharmacologic comfort measures Outcome: Not Progressing   Problem: Activity: Goal: Risk for activity intolerance will decrease Outcome: Not Progressing   Problem: Nutrition: Goal: Adequate nutrition will be maintained Outcome: Not Progressing

## 2024-04-07 NOTE — Progress Notes (Signed)
 Mobility Specialist - Progress Note   04/07/24 1205  Mobility  Activity Ambulated with assistance  Level of Assistance Minimal assist, patient does 75% or more  Assistive Device Front wheel walker  Distance Ambulated (ft) 40 ft  Range of Motion/Exercises Active  Activity Response Tolerated well  Mobility visit 1 Mobility  Mobility Specialist Start Time (ACUTE ONLY) 1145  Mobility Specialist Stop Time (ACUTE ONLY) 1205  Mobility Specialist Time Calculation (min) (ACUTE ONLY) 20 min   Pt was found on recliner chair and agreeable to mobilize. Son assisted with chair follow. Ambulated 10' then 30' with seated rest break in between. Grew fatigued with session and was wheeled back to room with all needs met. Son in room.   Erminio Leos,  Mobility Specialist Can be reached via Secure Chat

## 2024-04-07 NOTE — Progress Notes (Signed)
 PROGRESS NOTE    [name]  FMW:[id number] DOB: 1929/05/11 DOA: 04/04/2024 PCP: [name], NP  Outpatient Specialists:     Brief Narrative:  Patient is a 89 year old female with likely previously undiagnosed dementia, suspect Lewy body dementia (cannot rule out vascular dementia).  Other medical history includes diabetes mellitus with neuropathy, chronic back pain, vitamin D  deficiency, hypertension, hyperlipidemia, recent stroke and UTI.  Patient was admitted with generalized weakness, poor p.o. intake, nausea and vomiting and falls at home.  Patient was reported to be visually hallucinating and more confused.  Patient has baseline chronic kidney disease stage IIIb, with acute kidney injury.  Baseline serum creatinine of 1.32.  On presentation, serum creatinine was 3.6.  Patient is thought to be volume depleted as well.  Serum creatinine today is 3.13.  Overall, patient seems to be failing to thrive at home.  04/05/2024: Patient seen alongside patient's son.  Above documented history noted.  Awaiting PT OT input.  Will also consult palliative care team.  Leukocytosis is noted, however, patient has had intermittent leukocytosis.  No fever or chills.  No further nausea or vomiting.  No diarrhea.  Urine culture is nonsignificant.  MRI brain has not revealed any acute findings.  Chest x-ray is nonrevealing.  04/06/2024: Patient seen alongside patient's nurse and granddaughter.  Patient looks a lot better today.  No hallucinations reported.  04/07/2024: Patient seen alongside patient's son.  Patient is resting quietly.  No further hallucinations reported.  Potassium of 3.2 and magnesium  of 1.4 noted.  Will replete potassium and magnesium .  Consult TOC team.  SNF is recommended.   Assessment & Plan:   Principal Problem:   AKI (acute kidney injury) Active Problems:   Hypertension   Type 2 diabetes mellitus with complication, with long-term current use of insulin  (HCC)   Acute kidney  injury superimposed on chronic kidney disease   CKD stage 3b, GFR 30-44 ml/min (HCC)   Acute metabolic encephalopathy   Acute metabolic encephalopathy: Hallucinations: Possible previously undiagnosed dementia, query Lewy body dementia: Cannot rule out vascular dementia: Failure to thrive: -MRI brain did not reveal any acute findings. - Chest x-ray was nonrevealing. - Urine culture was not significant.   - The patient's son reported long-term memory loss, visual hallucinations, falls and failure to thrive. - Acute kidney injury on chronic kidney disease stage IIIb is noted. -Acute kidney injury is slowly resolving.  Serum creatinine of 1.93 today (down from 3.13).   - Leukocytosis persists. - Blood culture did not grow any organisms. - Procalcitonin of 0.12.   - Palliative care input is appreciated.   - Decrease Seroquel  from 25 mg p.o. once daily to 12.5 mg p.o. daily.   - PT /OT input is appreciated, and SNF is recommended.    AKI on CKD 3B: -Likely multifactorial. - Baseline CKD 3B. - Poor p.o. intake is reported. - Recent UTI. - Recent Bactrim  use. - AKI is slowly improving.  BUN of 40 and serum creatinine of 1.93 today. - Continue to monitor renal function and electrolytes. - Avoid nephrotoxins. - Keep MAP greater than 65 mmHg. - Dose all medications, considering eGFR of less than 10 mL/min per 1.73 m.     Diabetes mellitus type 2 with hyperglycemia: -Continue to optimize. A1c of 7.2%..     Mild elevated troponin: -Likely type II elevation.   - Advanced chronic kidney disease at baseline, with acute kidney injury is also noted.  -No chest pain or shortness of breath reported.  Mild leukocytosis: - Chronic and intermittent. - No constitutional symptoms endorsed. - Negative cultures. - Procalcitonin of 0.12.     - Leukocytosis persists.  History of remote stroke: - MRI brain this time without any acute stroke.   -Has been declining since the stroke as per the  family with hallucinations -Likely failing to thrive.   Generalized weakness, deconditioning, falls, ambulatory dysfunction: - History of falling. - PT/OT input.  - SNF recommended.   - Consult TOC.    Goals of care: - Patient is DNR. - Palliative care consult to define goals of care.      DVT prophylaxis: Subcutaneous heparin  Code Status: DO NOT RESUSCITATE.   Family Communication: Son by bedside Disposition Plan: This will depend on hospital course   Consultants:  Palliative care team consulted.  Procedures:  None.  Antimicrobials:  None.   Subjective: No history from patient.  Objective: Vitals:   04/06/24 1757 04/06/24 2100 04/07/24 0500 04/07/24 0643  BP: (!) 176/90 (!) 183/85  (!) 164/75  Pulse: 92 92  95  Resp:  17  16  Temp:  98.9 F (37.2 C)  98.9 F (37.2 C)  TempSrc:  Oral  Oral  SpO2:  99%  99%  Weight:   47.9 kg   Height:        Intake/Output Summary (Last 24 hours) at 04/07/2024 1140 Last data filed at 04/07/2024 1000 Gross per 24 hour  Intake 200 ml  Output 990 ml  Net -790 ml   Filed Weights   04/05/24 0049 04/06/24 0500 04/07/24 0500  Weight: 47 kg 47.7 kg 47.9 kg    Examination:  General exam: Appears calm and comfortable.  Patient is more interactive today. Respiratory system: Clear to auscultation. Respiratory effort normal. Cardiovascular system: S1 & S2 heard Gastrointestinal system: Abdomen is nondistended, soft and nontender. Central nervous system: Awake and alert. . Extremities: No leg edema  Data Reviewed: I have personally reviewed following labs and imaging studies  CBC: Recent Labs  Lab 04/04/24 1632 04/04/24 2310 04/05/24 0432 04/06/24 0459 04/07/24 0513  WBC 14.3* 14.6* 13.4* 12.8* 13.5*  NEUTROABS 11.2*  --   --  9.5* 9.9*  HGB 11.9* 12.7 11.4* 10.6* 11.1*  HCT 34.7* 37.0 33.2* 30.4* 31.7*  MCV 94.8 93.7 94.3 91.8 92.2  PLT 270 305 263 247 252   Basic Metabolic Panel: Recent Labs  Lab  04/04/24 1632 04/04/24 2310 04/05/24 0432 04/06/24 0459 04/07/24 0513  NA 137  --  142 141 139  K 4.0  --  3.8 3.5 3.2*  CL 98  --  106 108 105  CO2 20*  --  21* 19* 16*  GLUCOSE 362*  --  231* 179* 211*  BUN 85*  --  76* 48* 40*  CREATININE 3.60* 3.47* 3.13* 2.30* 1.93*  CALCIUM  9.1  --  8.9 8.1* 8.3*  MG  --   --  2.0  --  1.4*  PHOS  --   --   --  2.2* 3.5   GFR: Estimated Creatinine Clearance: 14.1 mL/min (A) (by C-G formula based on SCr of 1.93 mg/dL (H)). Liver Function Tests: Recent Labs  Lab 04/04/24 1632 04/06/24 0459 04/07/24 0513  AST 26  --   --   ALT 12  --   --   ALKPHOS 61  --   --   BILITOT 0.2  --   --   PROT 6.5  --   --   ALBUMIN 3.7 3.4* 3.5  No results for input(s): LIPASE, AMYLASE in the last 168 hours. No results for input(s): AMMONIA in the last 168 hours. Coagulation Profile: No results for input(s): INR, PROTIME in the last 168 hours. Cardiac Enzymes: No results for input(s): CKTOTAL, CKMB, CKMBINDEX, TROPONINI in the last 168 hours. BNP (last 3 results) No results for input(s): PROBNP in the last 8760 hours. HbA1C: Recent Labs    04/04/24 1632  HGBA1C 7.2*   CBG: Recent Labs  Lab 04/04/24 1630  GLUCAP 324*   Lipid Profile: No results for input(s): CHOL, HDL, LDLCALC, TRIG, CHOLHDL, LDLDIRECT in the last 72 hours. Thyroid  Function Tests: Recent Labs    04/04/24 2310  TSH 0.755   Anemia Panel: Recent Labs    04/04/24 2311  VITAMINB12 544   Urine analysis:    Component Value Date/Time   COLORURINE YELLOW 04/04/2024 1605   APPEARANCEUR HAZY (A) 04/04/2024 1605   LABSPEC 1.014 04/04/2024 1605   PHURINE 5.0 04/04/2024 1605   GLUCOSEU 150 (A) 04/04/2024 1605   HGBUR SMALL (A) 04/04/2024 1605   BILIRUBINUR NEGATIVE 04/04/2024 1605   KETONESUR NEGATIVE 04/04/2024 1605   PROTEINUR 100 (A) 04/04/2024 1605   UROBILINOGEN 0.2 07/29/2012 1237   NITRITE NEGATIVE 04/04/2024 1605   LEUKOCYTESUR  NEGATIVE 04/04/2024 1605   Sepsis Labs: @LABRCNTIP (procalcitonin:4,lacticidven:4)  ) Recent Results (from the past 240 hours)  Urine Culture     Status: Abnormal   Collection Time: 03/29/24  7:24 PM   Specimen: Urine, Clean Catch  Result Value Ref Range Status   Specimen Description   Final    URINE, CLEAN CATCH Performed at [facility] [address], [place], [place] [address]    Special Requests   Final    NONE Performed at [facility], [address] [place], [place] [address]    Culture (A)  Final    <10,000 COLONIES/mL INSIGNIFICANT GROWTH Performed at [facility], [address] [place], [place] [address]    Report Status 03/30/2024 FINAL  Final         Radiology Studies: No results found.       Scheduled Meds:  heparin   5,000 Units Subcutaneous Q8H   QUEtiapine   12.5 mg Oral QHS   sodium chloride  flush  3 mL Intravenous Q12H   Continuous Infusions:     LOS: 1 day    Time spent: 55 minutes.    Leatrice Chapel, MD  [facility] Pager #: [phone] 7PM-7AM contact night coverage as above

## 2024-04-08 DIAGNOSIS — E440 Moderate protein-calorie malnutrition: Secondary | ICD-10-CM | POA: Insufficient documentation

## 2024-04-08 DIAGNOSIS — N179 Acute kidney failure, unspecified: Secondary | ICD-10-CM | POA: Diagnosis not present

## 2024-04-08 LAB — RENAL FUNCTION PANEL
Albumin: 3.2 g/dL — ABNORMAL LOW (ref 3.5–5.0)
Anion gap: 14 (ref 5–15)
BUN: 36 mg/dL — ABNORMAL HIGH (ref 8–23)
CO2: 19 mmol/L — ABNORMAL LOW (ref 22–32)
Calcium: 8.1 mg/dL — ABNORMAL LOW (ref 8.9–10.3)
Chloride: 105 mmol/L (ref 98–111)
Creatinine, Ser: 2.05 mg/dL — ABNORMAL HIGH (ref 0.44–1.00)
GFR, Estimated: 22 mL/min — ABNORMAL LOW (ref >60)
Glucose, Bld: 276 mg/dL — ABNORMAL HIGH (ref 70–99)
Phosphorus: 2.8 mg/dL (ref 2.5–4.6)
Potassium: 3.6 mmol/L (ref 3.5–5.1)
Sodium: 137 mmol/L (ref 135–145)

## 2024-04-08 MED ORDER — ENSURE MAX PROTEIN PO LIQD
11.0000 [oz_av] | Freq: Two times a day (BID) | ORAL | Status: DC
  Administered 2024-04-08 – 2024-04-12 (×6): 11 [oz_av] via ORAL

## 2024-04-08 MED ORDER — LIDOCAINE 5 % EX PTCH
1.0000 | MEDICATED_PATCH | CUTANEOUS | Status: DC
  Administered 2024-04-08 – 2024-04-11 (×4): 1 via TRANSDERMAL
  Filled 2024-04-08 (×5): qty 1

## 2024-04-08 NOTE — Progress Notes (Signed)
 PROGRESS NOTE    [name]  FMW:[id number] DOB: 1929/08/24 DOA: 04/04/2024 PCP: [name], NP  Outpatient Specialists:     Brief Narrative:  Patient is a 89 year old female with likely previously undiagnosed dementia, suspect Lewy body dementia.  Other medical history includes diabetes mellitus with neuropathy, chronic back pain, vitamin D  deficiency, hypertension, hyperlipidemia, recent stroke and UTI.  Patient was admitted with generalized weakness, confusion, visual hallucination, poor p.o. intake, nausea and vomiting and falls at home.  Patient has baseline chronic kidney disease stage IIIb, with baseline serum creatinine of 1.18-1.47.  And was admitted with acute on chronic kidney injury.  On presentation, serum creatinine was 3.6.  Patient was thought to be volume depleted as well.  Serum creatinine today is 2.05.  Visual hallucination has improved with low-dose Seroquel  (quetiapine ).  Will be very careful with antipsychotics on this patient.  Patient is awaiting discharge to skilled nursing facility.   04/08/2024: No new complaints resting quietly.  Assessment & Plan:   Principal Problem:   AKI (acute kidney injury) Active Problems:   Hypertension   Type 2 diabetes mellitus with complication, with long-term current use of insulin  (HCC)   Acute kidney injury superimposed on chronic kidney disease   CKD stage 3b, GFR 30-44 ml/min (HCC)   Acute metabolic encephalopathy   Malnutrition of moderate degree   Acute metabolic encephalopathy: Hallucinations: Possible previously undiagnosed dementia, query Lewy body dementia: Cannot rule out vascular dementia: Failure to thrive: -MRI brain did not reveal any acute findings. - Chest x-ray was nonrevealing. - Urine culture was not significant.   - The patient's son reported long-term memory loss, visual hallucinations, falls and failure to thrive. - Acute kidney injury on chronic kidney disease stage IIIb is noted. -Acute  kidney injury is slowly resolving.  Serum creatinine of 2.05 today.   - Leukocytosis persists, but chronic. - Blood culture did not grow any organisms. - Procalcitonin of 0.12.   - Palliative care input is appreciated.   - Patient is currently on Seroquel  12.5 mg p.o. nightly.  Consider changing to Seroquel  12.5 mg p.o. nightly as needed on discharge.   - PT /OT input is appreciated, and SNF is recommended.  Pursue disposition   AKI on CKD 3B: -Likely multifactorial. - Baseline CKD 3B. - Poor p.o. intake is reported. - Recent UTI. - Recent Bactrim  use. - AKI is slowly improving.   - Continue to monitor renal function and electrolytes. - Avoid nephrotoxins. - Keep MAP greater than 65 mmHg. - Dose all medications, considering eGFR of less than 10 mL/min per 1.73 m.     Diabetes mellitus type 2 with hyperglycemia: -Continue to optimize. A1c of 7.2%..     Mild elevated troponin: -Likely type II elevation.   - Advanced chronic kidney disease at baseline, with acute kidney injury is also noted.  -No chest pain or shortness of breath reported.    Mild leukocytosis: - Chronic and intermittent. - No constitutional symptoms endorsed. - Negative cultures. - Procalcitonin of 0.12.     - Leukocytosis persists.  History of remote stroke: - MRI brain this time without any acute stroke.   -Has been declining since the stroke as per the family with hallucinations -Likely failing to thrive.   Generalized weakness, deconditioning, falls, ambulatory dysfunction: - History of falling. - PT/OT input.  - SNF recommended.   - Consult TOC.    Goals of care: - Patient is DNR. - Palliative care consult to define goals of care.  DVT prophylaxis: Subcutaneous heparin  Code Status: DO NOT RESUSCITATE.   Family Communication: Son by bedside Disposition Plan: This will depend on hospital course   Consultants:  Palliative care team consulted.  Procedures:  None.  Antimicrobials:   None.   Subjective: No history from patient.  Objective: Vitals:   04/08/24 0500 04/08/24 0619 04/08/24 1355 04/08/24 1754  BP:  (!) 140/74 (!) 115/53 (!) 156/69  Pulse:  (!) 102 (!) 102 (!) 106  Resp:  17 17 17   Temp:  98.4 F (36.9 C) 98.4 F (36.9 C) 98.3 F (36.8 C)  TempSrc:  Oral Oral Oral  SpO2:  98% 98% 100%  Weight: 47.5 kg     Height:        Intake/Output Summary (Last 24 hours) at 04/08/2024 1832 Last data filed at 04/08/2024 1547 Gross per 24 hour  Intake 1153.99 ml  Output 600 ml  Net 553.99 ml   Filed Weights   04/06/24 0500 04/07/24 0500 04/08/24 0500  Weight: 47.7 kg 47.9 kg 47.5 kg    Examination:  General exam: Appears calm and comfortable.  Patient is more interactive today. Respiratory system: Clear to auscultation. Respiratory effort normal. Cardiovascular system: S1 & S2 heard Gastrointestinal system: Abdomen is nondistended, soft and nontender. Central nervous system: Awake and alert. . Extremities: No leg edema  Data Reviewed: I have personally reviewed following labs and imaging studies  CBC: Recent Labs  Lab 04/04/24 1632 04/04/24 2310 04/05/24 0432 04/06/24 0459 04/07/24 0513  WBC 14.3* 14.6* 13.4* 12.8* 13.5*  NEUTROABS 11.2*  --   --  9.5* 9.9*  HGB 11.9* 12.7 11.4* 10.6* 11.1*  HCT 34.7* 37.0 33.2* 30.4* 31.7*  MCV 94.8 93.7 94.3 91.8 92.2  PLT 270 305 263 247 252   Basic Metabolic Panel: Recent Labs  Lab 04/04/24 1632 04/04/24 2310 04/05/24 0432 04/06/24 0459 04/07/24 0513 04/08/24 0410  NA 137  --  142 141 139 137  K 4.0  --  3.8 3.5 3.2* 3.6  CL 98  --  106 108 105 105  CO2 20*  --  21* 19* 16* 19*  GLUCOSE 362*  --  231* 179* 211* 276*  BUN 85*  --  76* 48* 40* 36*  CREATININE 3.60* 3.47* 3.13* 2.30* 1.93* 2.05*  CALCIUM  9.1  --  8.9 8.1* 8.3* 8.1*  MG  --   --  2.0  --  1.4*  --   PHOS  --   --   --  2.2* 3.5 2.8   GFR: Estimated Creatinine Clearance: 13.1 mL/min (A) (by C-G formula based on SCr of  2.05 mg/dL (H)). Liver Function Tests: Recent Labs  Lab 04/04/24 1632 04/06/24 0459 04/07/24 0513 04/08/24 0410  AST 26  --   --   --   ALT 12  --   --   --   ALKPHOS 61  --   --   --   BILITOT 0.2  --   --   --   PROT 6.5  --   --   --   ALBUMIN 3.7 3.4* 3.5 3.2*   No results for input(s): LIPASE, AMYLASE in the last 168 hours. No results for input(s): AMMONIA in the last 168 hours. Coagulation Profile: No results for input(s): INR, PROTIME in the last 168 hours. Cardiac Enzymes: No results for input(s): CKTOTAL, CKMB, CKMBINDEX, TROPONINI in the last 168 hours. BNP (last 3 results) No results for input(s): PROBNP in the last 8760 hours. HbA1C: No results  for input(s): HGBA1C in the last 72 hours.  CBG: Recent Labs  Lab 04/04/24 1630  GLUCAP 324*   Lipid Profile: No results for input(s): CHOL, HDL, LDLCALC, TRIG, CHOLHDL, LDLDIRECT in the last 72 hours. Thyroid  Function Tests: No results for input(s): TSH, T4TOTAL, FREET4, T3FREE, THYROIDAB in the last 72 hours.  Anemia Panel: No results for input(s): VITAMINB12, FOLATE, FERRITIN, TIBC, IRON, RETICCTPCT in the last 72 hours.  Urine analysis:    Component Value Date/Time   COLORURINE YELLOW 04/04/2024 1605   APPEARANCEUR HAZY (A) 04/04/2024 1605   LABSPEC 1.014 04/04/2024 1605   PHURINE 5.0 04/04/2024 1605   GLUCOSEU 150 (A) 04/04/2024 1605   HGBUR SMALL (A) 04/04/2024 1605   BILIRUBINUR NEGATIVE 04/04/2024 1605   KETONESUR NEGATIVE 04/04/2024 1605   PROTEINUR 100 (A) 04/04/2024 1605   UROBILINOGEN 0.2 07/29/2012 1237   NITRITE NEGATIVE 04/04/2024 1605   LEUKOCYTESUR NEGATIVE 04/04/2024 1605   Sepsis Labs: @LABRCNTIP (procalcitonin:4,lacticidven:4)  ) Recent Results (from the past 240 hours)  Urine Culture     Status: Abnormal   Collection Time: 03/29/24  7:24 PM   Specimen: Urine, Clean Catch  Result Value Ref Range Status   Specimen Description    Final    URINE, CLEAN CATCH Performed at [facility], [address] [place], [place] [address]    Special Requests   Final    NONE Performed at [facility], [address] [place], [place] [address]    Culture (A)  Final    <10,000 COLONIES/mL INSIGNIFICANT GROWTH Performed at [facility], [address] [place], [place] [address]    Report Status 03/30/2024 FINAL  Final         Radiology Studies: No results found.       Scheduled Meds:  heparin   5,000 Units Subcutaneous Q8H   lidocaine   1 patch Transdermal Q24H   Ensure Max Protein  11 oz Oral BID   QUEtiapine   12.5 mg Oral QHS   sodium chloride  flush  3 mL Intravenous Q12H   Continuous Infusions:     LOS: 2 days    Time spent: 35 minutes.  Addendum: Patient was reported to have fallen later today.  Monitor mental status.  Proceed with CT head if any changes in mental status.    Leatrice Chapel, MD  [facility] Pager #: [phone] 7PM-7AM contact night coverage as above

## 2024-04-08 NOTE — [place] FL2 (Signed)
 [place]  MEDICAID FL2 LEVEL OF CARE FORM     IDENTIFICATION  Patient Name: [name] Birthdate: [birth year] Sex: female Admission Date (Current Location): 04/04/2024  [facility] and [place] Number:  Producer, Television/film/video and Address:  [facility],  [address] [place], [place] [address]      Provider Number: [id number]  Attending Physician Name and Address:  Rosario Leatrice FERNS, MD  Relative Name and Phone Number:  Kabrina Christiano (son) Ph: [phone]    Current Level of Care: Hospital Recommended Level of Care: Skilled Nursing Facility Prior Approval Number:    Date Approved/Denied:   PASRR Number: [id number]  Discharge Plan: SNF    Current Diagnoses: Patient Active Problem List   Diagnosis Date Noted   AKI (acute kidney injury) 04/04/2024   Acute metabolic encephalopathy 03/18/2024   CKD stage 3b, GFR 30-44 ml/min (HCC) 10/12/2023   Hypercalcemia 10/12/2023   Sepsis secondary to UTI (HCC) 10/12/2023   Lactic acidosis 10/12/2023   UTI (urinary tract infection) 11/02/2022   Acute kidney injury superimposed on chronic kidney disease 11/02/2022   Frequent UTI 02/10/2020   Advanced nonexudative age-related macular degeneration of left eye with subfoveal involvement 01/08/2020   Memory changes 12/31/2019   Poorly controlled type 2 diabetes mellitus (HCC) 12/31/2019   Poor compliance with medication 12/31/2019   Advanced nonexudative age-related macular degeneration of right eye with subfoveal involvement 11/18/2019   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) 11/18/2019   Exudative age-related macular degeneration of right eye with active choroidal neovascularization (HCC) 08/19/2019   Exudative age-related macular degeneration of left eye with active choroidal neovascularization (HCC) 08/19/2019   Long term (current) use of insulin  (HCC) 06/10/2019   Major depression, recurrent 12/20/2018   Type  2 diabetes mellitus with complication, with long-term current use of insulin  (HCC) 02/09/2017   Atherosclerosis of aorta (HCC) by AbdCTscan on 06/28/2017 11/06/2016   Encounter for Medicare annual wellness exam 05/18/2015   Medication management 08/06/2013   CKD stage 3 due to type 2 diabetes mellitus (HCC)    Hyperlipidemia associated with type 2 diabetes mellitus (HCC)    Vitamin D  deficiency    Hypertension 07/29/2012   Chronic pain 07/29/2012    Orientation RESPIRATION BLADDER Height & Weight     Self, Time, Place  Normal Continent Weight: 104 lb 11.5 oz (47.5 kg) Height:  5' 2 (157.5 cm)  BEHAVIORAL SYMPTOMS/MOOD NEUROLOGICAL BOWEL NUTRITION STATUS      Continent Diet (Carb modified diet)  AMBULATORY STATUS COMMUNICATION OF NEEDS Skin   Limited Assist Verbally Skin abrasions, Other (Comment) (Erythema: bilateral heels, sacrum; Ecchymosis: bilateral arms; Abrasions: left lower chest)                       Personal Care Assistance Level of Assistance  Bathing, Feeding, Dressing Bathing Assistance: Limited assistance Feeding assistance: Independent Dressing Assistance: Limited assistance     Functional Limitations Info  Sight, Hearing, Speech Sight Info: Adequate Hearing Info: Impaired Speech Info: Adequate    SPECIAL CARE FACTORS FREQUENCY  PT (By licensed PT), OT (By licensed OT)     PT Frequency: 5x's/week OT Frequency: 5x's/week            Contractures Contractures Info: Not present    Additional Factors Info  Code Status, Allergies, Psychotropic Code Status Info: DNR Allergies Info: Ciprofloxacin , Morphine And Codeine Psychotropic Info: See discharge summary         Current Medications (  04/08/2024):  This is the current hospital active medication list Current Facility-Administered Medications  Medication Dose Route Frequency Provider Last Rate Last Admin   0.9 % NaCl with KCl 20 mEq/ L  infusion   Intravenous Continuous Rosario Leatrice FERNS, MD  50 mL/hr at 04/07/24 1908 New Bag at 04/07/24 1908   acetaminophen  (TYLENOL ) tablet 650 mg  650 mg Oral Q6H PRN Pokhrel, Laxman, MD   650 mg at 04/08/24 9166   Or   acetaminophen  (TYLENOL ) suppository 650 mg  650 mg Rectal Q6H PRN Pokhrel, Laxman, MD       heparin  injection 5,000 Units  5,000 Units Subcutaneous Q8H Pokhrel, Laxman, MD   5,000 Units at 04/08/24 0515   hydrALAZINE  (APRESOLINE ) injection 5 mg  5 mg Intravenous Q6H PRN Pokhrel, Laxman, MD   5 mg at 04/06/24 0539   LORazepam  (ATIVAN ) injection 1 mg  1 mg Intravenous Once PRN Patt Alm Macho, MD       ondansetron  (ZOFRAN ) tablet 4 mg  4 mg Oral Q6H PRN Pokhrel, Laxman, MD       Or   ondansetron  (ZOFRAN ) injection 4 mg  4 mg Intravenous Q6H PRN Pokhrel, Laxman, MD       oxyCODONE  (Oxy IR/ROXICODONE ) immediate release tablet 5 mg  5 mg Oral Q6H PRN Rosario Leatrice I, MD       polyethylene glycol (MIRALAX  / GLYCOLAX ) packet 17 g  17 g Oral Daily PRN Pokhrel, Laxman, MD       QUEtiapine  (SEROQUEL ) tablet 12.5 mg  12.5 mg Oral QHS Ogbata, Sylvester I, MD   12.5 mg at 04/07/24 2130   sodium chloride  flush (NS) 0.9 % injection 3 mL  3 mL Intravenous Q12H Pokhrel, Laxman, MD   3 mL at 04/06/24 2112   sodium chloride  flush (NS) 0.9 % injection 3 mL  3 mL Intravenous PRN Pokhrel, Laxman, MD         Discharge Medications: Please see discharge summary for a list of discharge medications.  Relevant Imaging Results:  Relevant Lab Results:   Additional Information SSN: [id number]  Duwaine GORMAN Aran, LCSW

## 2024-04-08 NOTE — Progress Notes (Signed)
 Initial Nutrition Assessment  DOCUMENTATION CODES:   Non-severe (moderate) malnutrition in context of chronic illness  INTERVENTION:   48 hour calorie count to start 12/14-12/16  - Please document % intake of all foods, drinks, and nutrition supplements patient consumes on meal tickets and place in envelope on patient's door.  - If patient skips/refuses meals please document 0% for that meal.   -Ensure MAX Protein po BID, each supplement provides 150 kcal and 30 grams of protein   NUTRITION DIAGNOSIS:   Moderate Malnutrition related to chronic illness (dementia) as evidenced by moderate fat depletion, mild muscle depletion.  GOAL:   Patient will meet greater than or equal to 90% of their needs  MONITOR:   PO intake, Supplement acceptance  REASON FOR ASSESSMENT:   Consult Calorie Count  ASSESSMENT:   89 year old female with likely previously undiagnosed dementia, suspect Lewy body dementia (cannot rule out vascular dementia).  Other medical history includes diabetes mellitus with neuropathy, chronic back pain, vitamin D  deficiency, hypertension, hyperlipidemia, recent stroke and UTI.  Patient was admitted with generalized weakness, poor p.o. intake, nausea and vomiting and falls at home.  Patient in room with family at bedside. Per son, she ate really well this morning. Didn't have much of an appetite yesterday. Willing to drink chocolate protein shakes, will order Ensure Max as this is our only chocolate option right now.  Pt's appetite hasn't been good. Pt has been having increased weakness and has been falling at home.   Calorie Count for 12/14: B: 10 kcals L: Not ordered D: 165 kcals, 9g protein No supplements. Total: 175 kcals (13% of needs), 9g protein (15% of needs)  So far today, patient consumed ~345 kcals and 11g protein with breakfast this morning.   Per weight records, pt's weight has been trending down over the years. Weighed 117 lbs in December  2024. Current weight: 104 lbs. Weight loss not significant but still concerning given advanced age.  Medications reviewed.  Labs reviewed.  NUTRITION - FOCUSED PHYSICAL EXAM:  Flowsheet Row Most Recent Value  Orbital Region Moderate depletion  Upper Arm Region Unable to assess  Thoracic and Lumbar Region Unable to assess  Buccal Region Moderate depletion  Clavicle Bone Region Mild depletion  Clavicle and Acromion Bone Region Unable to assess  [sweatshirt]  Scapular Bone Region Unable to assess  Dorsal Hand Moderate depletion  Patellar Region Unable to assess  Anterior Thigh Region Unable to assess  Posterior Calf Region Unable to assess  Edema (RD Assessment) None  Hair Reviewed  Eyes Reviewed  Mouth Reviewed  Skin Reviewed  Nails Unable to assess  [painted]    Diet Order:   Diet Order             Diet Carb Modified Room service appropriate? Yes  Diet effective now                   EDUCATION NEEDS:   No education needs have been identified at this time  Skin:  Skin Assessment: Reviewed RN Assessment  Last BM:  12/13 -type 3  Height:   Ht Readings from Last 1 Encounters:  04/05/24 5' 2 (1.575 m)    Weight:   Wt Readings from Last 1 Encounters:  04/08/24 47.5 kg    BMI:  Body mass index is 19.15 kg/m.  Estimated Nutritional Needs:   Kcal:  1200-1400  Protein:  60-70g  Fluid:  1.4L/day   Morna Lee, MS, RD, LDN Inpatient Clinical Dietitian Contact via  Secure chat

## 2024-04-08 NOTE — TOC Progression Note (Signed)
 Transition of Care [facility]) - Progression Note   Patient Details  Name: [name] MRN: [id number] Date of Birth: Sep 11, 1929  [facility] CM/SW Contact  Duwaine GORMAN Aran, LCSW Phone Number: 04/08/2024, 10:32 AM  Clinical Narrative: PT/OT evaluations now recommend SNF. CSW spoke with son, Arley, regarding recommendation. Per son, patient and family are in agreement with SNF and would prefer a SNF in [facility] if possible. FL2 done; PASRR received. Initial referral faxed out in hub. Care management awaiting bed offers.  Expected Discharge Plan: Skilled Nursing Facility Barriers to Discharge: Continued Medical Work up, SNF Pending bed offer, English As A Second Language Teacher  Expected Discharge Plan and Services Post Acute Care Choice: Skilled Nursing Facility Living arrangements for the past 2 months: Single Family Home  Social Drivers of Health (SDOH) Interventions SDOH Screenings   Food Insecurity: No Food Insecurity (04/05/2024)  Housing: Low Risk (04/05/2024)  Transportation Needs: Patient Unable To Answer (04/05/2024)  Utilities: Patient Unable To Answer (04/05/2024)  Depression (PHQ2-9): Low Risk (09/22/2022)  Financial Resource Strain: Low Risk (12/29/2023)   Received from [place] [facility]  Social Connections: Moderately Integrated (04/05/2024)  Tobacco Use: Low Risk (04/04/2024)   Readmission Risk Interventions    11/03/2022    1:11 PM  Readmission Risk Prevention Plan  Post Dischage Appt Complete  Medication Screening Complete  Transportation Screening Complete

## 2024-04-08 NOTE — Progress Notes (Signed)
 Physical Therapy Treatment Patient Details Name: [name] MRN: [id number] DOB: [birth year] Today's Date: 04/08/2024   History of Present Illness Patient is a 89 year old female who presented to [facility] on 04/04/2024 with dx AKI, acute metabolic encephalopathy/hallucinations. Was falling out of bed at home and family reports decline, has been hallucinating since she had a stroke admission almost 3 weeks back.  Concern per Lewy body dementia vs vascular dementia and no new acute findings with brain MRI.  PMH: CVA, hypokalemia, CKD III, DM II, HTN, and HLD.    PT Comments   Pt admitted with above diagnosis.  Pt currently with functional limitations due to the deficits listed below (see PT Problem List). Pt in bed when PT arrived. Sons present as well as education administrator. Pt reports LBP and nursing had administered pain medication prior to PT arrival. Pt agreeable to therapy intervention. Pt required min A for supine to sit, min A for sit to stand  from EOB and CGA for sit to stand  from commode. Gait with RW, CGA to min A, cues for posture, proper distance from Rw and attention to obstacles on R side, pt demonstrated B LE and UE instability/shaking and with fatigue excessive trunk flexion bed <> commode ~ 20 feet x 2. Pt more alert and oriented today, however fatigue limiting gait duration and safety.  Patient will benefit from continued inpatient follow up therapy, <3 hours/day.  Pt will benefit from acute skilled PT to increase their independence and safety with mobility to allow discharge.      If plan is discharge home, recommend the following: A little help with bathing/dressing/bathroom;Assistance with cooking/housework;Assist for transportation;A lot of help with walking and/or transfers;Help with stairs or ramp for entrance   Can travel by private vehicle     Yes  Equipment Recommendations  None recommended by PT    Recommendations for Other Services       Precautions / Restrictions  Precautions Precautions: Fall Recall of Precautions/Restrictions: Impaired Restrictions Weight Bearing Restrictions Per Provider Order: No     Mobility  Bed Mobility Overal bed mobility: Needs Assistance Bed Mobility: Supine to Sit     Supine to sit: HOB elevated, Min assist     General bed mobility comments: increased time, mulimodal cues    Transfers Overall transfer level: Needs assistance Equipment used: Rolling walker (2 wheels) Transfers: Sit to/from Stand Sit to Stand: Min assist           General transfer comment: min A for pull to stand from EOB x 2, once standing min A for stability and pt able to perform sit to stand from commode with CGA x 2    Ambulation/Gait Ambulation/Gait assistance: Min assist Gait Distance (Feet): 20 Feet Assistive device: Rolling walker (2 wheels) Gait Pattern/deviations: Narrow base of support, Trunk flexed, Step-to pattern Gait velocity: decreased     General Gait Details: cues for safety, posture, proper distance from RW, , A to navigate obstacles espeically on the R side, narrow BOS and difficulty with turns and with fatigue noted to have shaky UE and LE with excessive trunk flexion   Stairs             Wheelchair Mobility     Tilt Bed    Modified Rankin (Stroke Patients Only)       Balance Overall balance assessment: Needs assistance Sitting-balance support: Feet supported Sitting balance-Leahy Scale: Fair Sitting balance - Comments: seated EOB and when on commode R lateral and  posterior lean   Standing balance support: Bilateral upper extremity supported, During functional activity, Reliant on assistive device for balance Standing balance-Leahy Scale: Poor Standing balance comment: B UE support and CGA to min A                            Communication Communication Communication: No apparent difficulties  Cognition Arousal: Alert Behavior During Therapy: WFL for tasks assessed/performed    PT - Cognitive impairments: Orientation, Memory, Safety/Judgement, Problem solving, Sequencing                       PT - Cognition Comments: pt presents with improved alertness and orientation Following commands: Impaired Following commands impaired: Follows one step commands with increased time    Cueing Cueing Techniques: Verbal cues, Gestural cues, Tactile cues, Visual cues  Exercises      General Comments General comments (skin integrity, edema, etc.): sons present, nurse at begning of therapy session and returned at end of PT tx session      Pertinent Vitals/Pain Pain Assessment Pain Assessment: Faces Faces Pain Scale: Hurts even more Pain Location: LBP Pain Descriptors / Indicators: Aching, Constant, Discomfort, Dull, Grimacing Pain Intervention(s): Limited activity within patient's tolerance, Monitored during session, Premedicated before session, Repositioned    Home Living                          Prior Function            PT Goals (current goals can now be found in the care plan section) Acute Rehab PT Goals Patient Stated Goal: go home PT Goal Formulation: With patient/family Time For Goal Achievement: 04/19/24 Potential to Achieve Goals: Good Progress towards PT goals: Progressing toward goals (slowly)    Frequency    Min 3X/week      PT Plan      Co-evaluation              AM-PAC PT 6 Clicks Mobility   Outcome Measure  Help needed turning from your back to your side while in a flat bed without using bedrails?: A Little Help needed moving from lying on your back to sitting on the side of a flat bed without using bedrails?: A Little Help needed moving to and from a bed to a chair (including a wheelchair)?: A Little Help needed standing up from a chair using your arms (e.g., wheelchair or bedside chair)?: A Little Help needed to walk in hospital room?: A Little Help needed climbing 3-5 steps with a railing? : Total 6  Click Score: 16    End of Session Equipment Utilized During Treatment: Gait belt Activity Tolerance: Patient limited by fatigue Patient left: with call bell/phone within reach;with family/visitor present;in chair;with nursing/sitter in room Nurse Communication: Mobility status PT Visit Diagnosis: Unsteadiness on feet (R26.81);Difficulty in walking, not elsewhere classified (R26.2);Repeated falls (R29.6);Muscle weakness (generalized) (M62.81);Other abnormalities of gait and mobility (R26.89)     Time: 8871-8847 PT Time Calculation (min) (ACUTE ONLY): 24 min  Charges:    $Gait Training: 8-22 mins $Therapeutic Activity: 8-22 mins PT General Charges $$ ACUTE PT VISIT: 1 Visit                     [place], PT Acute Rehab    [place] VEAR Drone 04/08/2024, 12:16 PM

## 2024-04-08 NOTE — Progress Notes (Signed)
 Pt was observed laying on floor, pt stated she was trying to call her husband whom passed away last year. Than she stated she was trying to call her son and wake him up. RN Devere and charge nurse Arvin was called in room and writer came in assessed pt and was observed to have hematoma on head, ice pack was given and pt stated her head hurt pain out of 7 Oxy was given and dressing put on scar that is on lower right leg and back of right arm patient is laying in bed calm and cooperative, son sitting beside mom. Doctor was notified

## 2024-04-09 DIAGNOSIS — Z8673 Personal history of transient ischemic attack (TIA), and cerebral infarction without residual deficits: Secondary | ICD-10-CM

## 2024-04-09 NOTE — Progress Notes (Signed)
 Progress Note    [name]   FMW:[id number]  DOB: 06-18-1929  DOA: 04/04/2024     3 PCP: [name], NP  Initial CC: Weakness  Hospital Course: [name] is a 89 year old female with likely previously undiagnosed dementia, DMII, neuropathy, chronic back pain, vitamin D  deficiency, HTN, HLD, CVA. She was admitted with generalized weakness, confusion, visual hallucinations, poor intake, falls at home. Creatinine on admission 3.6, BUN 85. She was started on IV fluids and admitted for further workup.     Assessment & Plan:    Acute metabolic encephalopathy Hallucinations Possible previously undiagnosed dementia Failure to thrive - probable combo of uremia from dehydration/poor intake with background of stroke history, frailty, and dementia  -MRI brain did not reveal any acute findings - hx CVA 03/18/24 with ongoing decline since - Chest x-ray was nonrevealing. - Urine culture was not significant.   - family reported long-term memory loss, visual hallucinations, falls and failure to thrive - mentation has improved with IVF and improvement in renal function  - Continue nightly Seroquel  - Continue encouraging good intake - PT/OT evaluations, plan is for SNF - Eventually needs at least palliative care when going home from rehab  AKI on CKD 3B -Likely multifactorial - Baseline CKD 3B - Poor p.o. intake is reported - Recent UTI - Recent Bactrim  use. -Creatinine 3.6 on admission.  Has steadily improved with fluids   Diabetes mellitus type 2 with hyperglycemia: A1c of 7.2% - Continue diet control   Mild elevated troponin -Likely type II elevation - Advanced chronic kidney disease at baseline, with acute kidney injury is also noted. -No chest pain or shortness of breath reported.    Mild leukocytosis - Chronic and intermittent - No constitutional symptoms endorsed - Negative cultures   History CVA - MRI brain 03/18/2024 with acute infarct involving left parietal  lobe.  Moderate chronic microvascular changes and remote infarcts noted right frontal periventricular white matter, right thalamus with associated chronic microhemorrhage, and left cerebellum   Generalized weakness, deconditioning, falls, ambulatory dysfunction: - History of falling; probable contribution from prior left cerebellar stroke as well as deconditioning and dehydration; overall failure to thrive -PT/OT evals, awaiting SNF placement   Goals of care - Patient is DNR. - Palliative care consult to define goals of care -Home with palliative care after rehab  Interval History:  No events overnight.  Fall from 2 nights ago noted during chart review.  Family present bedside this morning also.  Tentative plan is for still going to rehab at discharge then home with at least palliative care in place.  Patient resting in bed comfortable, no distress, able to answer questions and follow commands easily.  Antimicrobials:   DVT prophylaxis:  heparin  injection 5,000 Units Start: 04/04/24 2200 SCDs Start: 04/04/24 2019   Code Status:   Code Status: Limited: Do not attempt resuscitation (DNR) -DNR-LIMITED -Do Not Intubate/DNI   Mobility Assessment (Last 72 Hours)     Mobility Assessment     Row Name 04/09/24 0803 04/08/24 2100 04/08/24 1200 04/08/24 0840 04/07/24 1943   Does the patient have exclusion criteria? No- Perform mobility assessment No- Perform mobility assessment -- No- Perform mobility assessment No- Perform mobility assessment   What is the highest level of mobility based on the mobility assessment? Level 4 (Ambulates with assistance) - Balance while stepping forward/back - Complete Level 4 (Ambulates with assistance) - Balance while stepping forward/back - Complete Level 4 (Ambulates with assistance) - Balance while stepping forward/back - Complete  Level 3 (Stands with assistance) - Balance while standing  and cannot march in place Level 4 (Ambulates with assistance) - Balance  while stepping forward/back - Complete   Is the above level different from baseline mobility prior to current illness? Yes - Recommend PT order Yes - Recommend PT order -- Yes - Recommend PT order Yes - Recommend PT order    Row Name 04/07/24 0930 04/07/24 9078 04/06/24 2000       Does the patient have exclusion criteria? No- Perform mobility assessment No- Perform mobility assessment No- Perform mobility assessment     What is the highest level of mobility based on the mobility assessment? Level 4 (Ambulates with assistance) - Balance while stepping forward/back - Complete Level 4 (Ambulates with assistance) - Balance while stepping forward/back - Complete Level 4 (Ambulates with assistance) - Balance while stepping forward/back - Complete     Is the above level different from baseline mobility prior to current illness? Yes - Recommend PT order Yes - Recommend PT order Yes - Recommend PT order        Diet: Diet Orders (From admission, onward)     Start     Ordered   04/04/24 2020  Diet Carb Modified Room service appropriate? Yes  Diet effective now       Question Answer Comment  Diet-HS Snack? Nothing   Calorie Level Medium 1600-2000   Fluid consistency: Thin   Room service appropriate? Yes      04/04/24 2027            Barriers to discharge: None Disposition Plan: SNF HH orders placed: N/A Status is: Inpatient  Objective: Blood pressure 124/60, pulse (!) 109, temperature 98.5 F (36.9 C), temperature source Oral, resp. rate 17, height 5' 2 (1.575 m), weight 47.9 kg, SpO2 99%.  Examination:  Physical Exam Constitutional:      Comments: Pleasantly confused resting in bed in no distress  HENT:     Head: Normocephalic.     Comments: Right forehead hematoma noted    Mouth/Throat:     Mouth: Mucous membranes are moist.  Eyes:     Extraocular Movements: Extraocular movements intact.  Cardiovascular:     Rate and Rhythm: Normal rate and regular rhythm.  Pulmonary:      Effort: Pulmonary effort is normal. No respiratory distress.     Breath sounds: Normal breath sounds. No wheezing.  Abdominal:     General: Bowel sounds are normal. There is no distension.     Palpations: Abdomen is soft.     Tenderness: There is no abdominal tenderness.  Musculoskeletal:     Cervical back: Normal range of motion and neck supple.     Right lower leg: No edema.     Left lower leg: No edema.     Comments: Left hip bruising noted  Skin:    General: Skin is warm and dry.  Neurological:     Comments: Follows commands and moves all 4 extremities equally.  No obvious focal deficits  Psychiatric:        Mood and Affect: Mood normal.      Consultants:  Palliative care  Procedures:    Data Reviewed: No results found for this or any previous visit (from the past 24 hours).  I have reviewed pertinent nursing notes, vitals, labs, and images as necessary. I have ordered labwork to follow up on as indicated.  I have reviewed the last notes from staff over past 24 hours. I have  discussed patient's care plan and test results with nursing staff, CM/SW, and other staff as appropriate.  Old records reviewed in assessment of this patient  Time spent: Greater than 50% of the 55 minute visit was spent in counseling/coordination of care for the patient as laid out in the A&P.   LOS: 3 days   Alm Apo, MD [facility] 04/09/2024, 3:33 PM

## 2024-04-09 NOTE — Plan of Care (Signed)
  Problem: Activity: Goal: Risk for activity intolerance will decrease Outcome: Progressing   Problem: Nutrition: Goal: Adequate nutrition will be maintained Outcome: Progressing   Problem: Elimination: Goal: Will not experience complications related to urinary retention Outcome: Progressing   

## 2024-04-09 NOTE — Progress Notes (Signed)
 Calorie Count Note  48 hour calorie count ordered. Day 2 results below.  Diet: CHO modified Supplements:  -Ensure MAX Protein po BID, each supplement provides 150 kcal and 30 grams of protein   12/15: Breakfast: 345 kcals, 11g protein Lunch: 165 kcals, 11g protein Dinner: 140 kcals, 7g protein Supplements: 1/2 Ensure Max =75 kcals, 15g protein Family brought a [employer] protein bar, pt ate 100% = 180 kcals, 12g protein  Total intake: 905 kcal (75% of minimum estimated needs)  56g protein (93% of minimum estimated needs)  Nutrition Dx: Moderate Malnutrition related to chronic illness (dementia) as evidenced by moderate fat depletion, mild muscle depletion.   Goal:  Pt to meet >/= 90% of their estimated nutrition needs   Intervention:  -Family to encourage PO intakes -Continue Ensure Max BID  Kelsey Lee, MS, RD, LDN Inpatient Clinical Dietitian Contact via Secure chat

## 2024-04-09 NOTE — TOC Progression Note (Addendum)
 Transition of Care [facility]) - Progression Note    Patient Details  Name: [name] MRN: [id number] Date of Birth: Jul 30, 1929  Transition of [facility]) CM/SW Contact  Alfonse JONELLE Rex, RN Phone Number: 04/09/2024, 2:15 PM  Clinical Narrative:   met with patient's son, Arley, on unit, presented SNF bed offers ([facility], [facility], [facility]). Arley states he will discuss with family and notify NCM of bed choice.   -4:32pm Family accepted bed offer at [facility] [place], SNF admit coordinator notified. SNF auth initiated vial [facility]. Auth ID [id number], days approved 04/09/2024-04/11/2024     Expected Discharge Plan: Skilled Nursing Facility Barriers to Discharge: Continued Medical Work up, SNF Pending bed offer, English As A Second Language Teacher               Expected Discharge Plan and Services     Post Acute Care Choice: Skilled Nursing Facility Living arrangements for the past 2 months: Single Family Home                                       Social Drivers of Health (SDOH) Interventions SDOH Screenings   Food Insecurity: No Food Insecurity (04/05/2024)  Housing: Low Risk (04/05/2024)  Transportation Needs: Patient Unable To Answer (04/05/2024)  Utilities: Patient Unable To Answer (04/05/2024)  Depression (PHQ2-9): Low Risk (09/22/2022)  Financial Resource Strain: Low Risk (12/29/2023)   Received from [facility]  Social Connections: Moderately Integrated (04/05/2024)  Tobacco Use: Low Risk (04/04/2024)    Readmission Risk Interventions    04/08/2024   10:37 AM 11/03/2022    1:11 PM  Readmission Risk Prevention Plan  Post Dischage Appt  Complete  Medication Screening  Complete  Transportation Screening Complete Complete  HRI or Home Care Consult Complete   Social Work Consult for Recovery Care Planning/Counseling Complete   Palliative Care Screening Complete   Medication Review Oceanographer) Complete

## 2024-04-10 ENCOUNTER — Inpatient Hospital Stay (HOSPITAL_COMMUNITY)

## 2024-04-10 DIAGNOSIS — G9341 Metabolic encephalopathy: Secondary | ICD-10-CM | POA: Diagnosis not present

## 2024-04-10 DIAGNOSIS — D62 Acute posthemorrhagic anemia: Secondary | ICD-10-CM | POA: Diagnosis not present

## 2024-04-10 DIAGNOSIS — S7002XA Contusion of left hip, initial encounter: Secondary | ICD-10-CM

## 2024-04-10 DIAGNOSIS — N179 Acute kidney failure, unspecified: Secondary | ICD-10-CM | POA: Diagnosis not present

## 2024-04-10 LAB — CBC WITH DIFFERENTIAL/PLATELET
Abs Immature Granulocytes: 0.25 K/uL — ABNORMAL HIGH (ref 0.00–0.07)
Abs Immature Granulocytes: 0.41 K/uL — ABNORMAL HIGH (ref 0.00–0.07)
Basophils Absolute: 0 K/uL (ref 0.0–0.1)
Basophils Absolute: 0.1 K/uL (ref 0.0–0.1)
Basophils Relative: 0 %
Basophils Relative: 0 %
Eosinophils Absolute: 0.2 K/uL (ref 0.0–0.5)
Eosinophils Absolute: 0.2 K/uL (ref 0.0–0.5)
Eosinophils Relative: 2 %
Eosinophils Relative: 1 %
HCT: 18.7 % — ABNORMAL LOW (ref 36.0–46.0)
HCT: 20.8 % — ABNORMAL LOW (ref 36.0–46.0)
Hemoglobin: 6.4 g/dL — CL (ref 12.0–15.0)
Hemoglobin: 7.1 g/dL — ABNORMAL LOW (ref 12.0–15.0)
Immature Granulocytes: 2 %
Immature Granulocytes: 2 %
Lymphocytes Relative: 18 %
Lymphocytes Relative: 18 %
Lymphs Abs: 2.8 K/uL (ref 0.7–4.0)
Lymphs Abs: 3 K/uL (ref 0.7–4.0)
MCH: 32.3 pg (ref 26.0–34.0)
MCH: 32.3 pg (ref 26.0–34.0)
MCHC: 34.2 g/dL (ref 30.0–36.0)
MCHC: 34.1 g/dL (ref 30.0–36.0)
MCV: 94.4 fL (ref 80.0–100.0)
MCV: 94.5 fL (ref 80.0–100.0)
Monocytes Absolute: 1.7 K/uL — ABNORMAL HIGH (ref 0.1–1.0)
Monocytes Absolute: 1.6 K/uL — ABNORMAL HIGH (ref 0.1–1.0)
Monocytes Relative: 11 %
Monocytes Relative: 9 %
Neutro Abs: 10.9 K/uL — ABNORMAL HIGH (ref 1.7–7.7)
Neutro Abs: 11.8 K/uL — ABNORMAL HIGH (ref 1.7–7.7)
Neutrophils Relative %: 67 %
Neutrophils Relative %: 70 %
Platelets: 198 K/uL (ref 150–400)
Platelets: 237 K/uL (ref 150–400)
RBC: 1.98 MIL/uL — ABNORMAL LOW (ref 3.87–5.11)
RBC: 2.2 MIL/uL — ABNORMAL LOW (ref 3.87–5.11)
RDW: 13.5 % (ref 11.5–15.5)
RDW: 13.5 % (ref 11.5–15.5)
WBC: 15.9 K/uL — ABNORMAL HIGH (ref 4.0–10.5)
WBC: 17.1 K/uL — ABNORMAL HIGH (ref 4.0–10.5)
nRBC: 0.3 % — ABNORMAL HIGH (ref 0.0–0.2)
nRBC: 0.4 % — ABNORMAL HIGH (ref 0.0–0.2)

## 2024-04-10 LAB — GLUCOSE, CAPILLARY
Glucose-Capillary: 391 mg/dL — ABNORMAL HIGH (ref 70–99)
Glucose-Capillary: 344 mg/dL — ABNORMAL HIGH (ref 70–99)
Glucose-Capillary: 421 mg/dL — ABNORMAL HIGH (ref 70–99)
Glucose-Capillary: 364 mg/dL — ABNORMAL HIGH (ref 70–99)
Glucose-Capillary: 254 mg/dL — ABNORMAL HIGH (ref 70–99)

## 2024-04-10 LAB — BASIC METABOLIC PANEL WITH GFR
Anion gap: 11 (ref 5–15)
Anion gap: 15 (ref 5–15)
BUN: 52 mg/dL — ABNORMAL HIGH (ref 8–23)
BUN: 52 mg/dL — ABNORMAL HIGH (ref 8–23)
CO2: 20 mmol/L — ABNORMAL LOW (ref 22–32)
CO2: 17 mmol/L — ABNORMAL LOW (ref 22–32)
Calcium: 8.4 mg/dL — ABNORMAL LOW (ref 8.9–10.3)
Calcium: 8.5 mg/dL — ABNORMAL LOW (ref 8.9–10.3)
Chloride: 103 mmol/L (ref 98–111)
Chloride: 104 mmol/L (ref 98–111)
Creatinine, Ser: 2.31 mg/dL — ABNORMAL HIGH (ref 0.44–1.00)
Creatinine, Ser: 2.27 mg/dL — ABNORMAL HIGH (ref 0.44–1.00)
GFR, Estimated: 19 mL/min — ABNORMAL LOW (ref >60)
GFR, Estimated: 20 mL/min — ABNORMAL LOW (ref >60)
Glucose, Bld: 489 mg/dL — ABNORMAL HIGH (ref 70–99)
Glucose, Bld: 466 mg/dL — ABNORMAL HIGH (ref 70–99)
Potassium: 4.2 mmol/L (ref 3.5–5.1)
Potassium: 4.6 mmol/L (ref 3.5–5.1)
Sodium: 135 mmol/L (ref 135–145)
Sodium: 136 mmol/L (ref 135–145)

## 2024-04-10 LAB — MAGNESIUM: Magnesium: 1.9 mg/dL (ref 1.7–2.4)

## 2024-04-10 LAB — ABO/RH: ABO/RH(D): AB POS

## 2024-04-10 LAB — PREPARE RBC (CROSSMATCH)

## 2024-04-10 MED ORDER — INSULIN GLARGINE 100 UNIT/ML ~~LOC~~ SOLN
5.0000 [IU] | Freq: Every day | SUBCUTANEOUS | Status: DC
  Administered 2024-04-10 – 2024-04-11 (×2): 5 [IU] via SUBCUTANEOUS
  Filled 2024-04-10 (×3): qty 0.05

## 2024-04-10 MED ORDER — INSULIN GLARGINE 100 UNITS/ML SOLOSTAR PEN: 5.0000 [IU] | PEN_INJECTOR | Freq: Every day | SUBCUTANEOUS | Status: DC

## 2024-04-10 MED ORDER — INSULIN ASPART 100 UNIT/ML IJ SOLN
0.0000 [IU] | Freq: Three times a day (TID) | INTRAMUSCULAR | Status: DC
  Administered 2024-04-10: 08:00:00 5 [IU] via SUBCUTANEOUS
  Administered 2024-04-10: 14:00:00 6 [IU] via SUBCUTANEOUS
  Administered 2024-04-10: 22:00:00 3 [IU] via SUBCUTANEOUS
  Administered 2024-04-10: 18:00:00 5 [IU] via SUBCUTANEOUS
  Administered 2024-04-11: 09:00:00 2 [IU] via SUBCUTANEOUS
  Administered 2024-04-11: 12:00:00 4 [IU] via SUBCUTANEOUS
  Administered 2024-04-11: 22:00:00 5 [IU] via SUBCUTANEOUS
  Administered 2024-04-11: 17:00:00 4 [IU] via SUBCUTANEOUS
  Administered 2024-04-12: 5 [IU] via SUBCUTANEOUS
  Administered 2024-04-12: 4 [IU] via SUBCUTANEOUS
  Filled 2024-04-10: qty 2
  Filled 2024-04-10 (×2): qty 5
  Filled 2024-04-10 (×2): qty 4
  Filled 2024-04-10: qty 5
  Filled 2024-04-10: qty 6
  Filled 2024-04-10: qty 5
  Filled 2024-04-10: qty 3

## 2024-04-10 MED ORDER — SODIUM CHLORIDE 0.9% IV SOLUTION: Freq: Once | INTRAVENOUS | Status: DC

## 2024-04-10 NOTE — Progress Notes (Signed)
 Progress Note    [name]   FMW:[id number]  DOB: 1930-01-08  DOA: 04/04/2024     4 PCP: [name], NP  Initial CC: Weakness  Hospital Course: [name] is a 89 year old female with likely previously undiagnosed dementia, DMII, neuropathy, chronic back pain, vitamin D  deficiency, HTN, HLD, CVA. She was admitted with generalized weakness, confusion, visual hallucinations, poor intake, falls at home. Creatinine on admission 3.6, BUN 85. She was started on IV fluids and admitted for further workup.     Assessment & Plan:    ABLA Normocytic anemia Fall -Patient had fall out of bed on night of 04/08/2024.  Sustained right forehead hematoma and developed large bruising on left hip - Hgb had precipitous drop today, confirmed on labs - CT head, left hip, A/P performed for further assessment - Large left hip subcutaneous hematoma measuring 12.4 x 7.8 x 5.1 cm superficial to the left gluteus maximus and left IT band; discussed findings with son bedside and patient.  CT head showing right frontal scalp hematoma with no underlying fracture - Patient amenable with blood transfusion.  Will start with 1 unit PRBC for now and continue trending - Hold HSQ - trend H/H  Acute metabolic encephalopathy Hallucinations Possible previously undiagnosed dementia Failure to thrive - probable combo of uremia from dehydration/poor intake with background of stroke history, frailty, and dementia  -MRI brain did not reveal any acute findings - hx CVA 03/18/24 with ongoing decline since - Chest x-ray was nonrevealing. - Urine culture was not significant.   - family reported long-term memory loss, visual hallucinations, falls and failure to thrive - mentation has improved with IVF and improvement in renal function  - Continue nightly Seroquel  - Continue encouraging good intake - PT/OT evaluations, plan is for SNF - Eventually needs at least palliative care when going home from rehab  AKI on  CKD 3B -Likely multifactorial - Baseline CKD 3B - Poor p.o. intake is reported - Recent UTI - Recent Bactrim  use. -Creatinine 3.6 on admission.  Has steadily improved with fluids   Diabetes mellitus type 2 with hyperglycemia: A1c of 7.2% - Continue diet control   Mild elevated troponin -Likely type II elevation - Advanced chronic kidney disease at baseline, with acute kidney injury is also noted. -No chest pain or shortness of breath reported.    Mild leukocytosis - Chronic and intermittent - No constitutional symptoms endorsed - Negative cultures   History CVA - MRI brain 03/18/2024 with acute infarct involving left parietal lobe.  Moderate chronic microvascular changes and remote infarcts noted right frontal periventricular white matter, right thalamus with associated chronic microhemorrhage, and left cerebellum   Generalized weakness, deconditioning, falls, ambulatory dysfunction: - History of falling; probable contribution from prior left cerebellar stroke as well as deconditioning and dehydration; overall failure to thrive -PT/OT evals, awaiting SNF placement   Goals of care - Patient is DNR. - Palliative care consult to define goals of care -Home with palliative care after rehab  Interval History:  No events overnight. Worsening bruising and pain in left hip. Hgb also dropped further this morning and labs repeated to confirm.  CT ordered to evaluate more also. Confirms large left hip hematoma as well. Patient okay with PRBC. Son bedside and updated also with results.  Tentative plan for d/c to [facility] on Friday if stable by then.   Antimicrobials:   DVT prophylaxis:  SCDs Start: 04/04/24 2019   Code Status:   Code Status: Limited: Do not attempt resuscitation (  DNR) -DNR-LIMITED -Do Not Intubate/DNI   Mobility Assessment (Last 72 Hours)     Mobility Assessment     Row Name 04/10/24 1100 04/10/24 0815 04/09/24 1908 04/09/24 0803 04/08/24 2100   Does the  patient have exclusion criteria? -- Yes- Hold (Level 0) - Assessment complete No- Perform mobility assessment No- Perform mobility assessment No- Perform mobility assessment   What is the highest level of mobility based on the mobility assessment? Level 1 (Bedfast) - Unable to balance while sitting on edge of bed Level 4 (Ambulates with assistance) - Balance while stepping forward/back - Complete Level 4 (Ambulates with assistance) - Balance while stepping forward/back - Complete Level 4 (Ambulates with assistance) - Balance while stepping forward/back - Complete Level 4 (Ambulates with assistance) - Balance while stepping forward/back - Complete   Is the above level different from baseline mobility prior to current illness? -- Yes - Recommend PT order Yes - Recommend PT order Yes - Recommend PT order Yes - Recommend PT order    Row Name 04/08/24 1200 04/08/24 0840 04/07/24 1943       Does the patient have exclusion criteria? -- No- Perform mobility assessment No- Perform mobility assessment     What is the highest level of mobility based on the mobility assessment? Level 4 (Ambulates with assistance) - Balance while stepping forward/back - Complete Level 3 (Stands with assistance) - Balance while standing  and cannot march in place Level 4 (Ambulates with assistance) - Balance while stepping forward/back - Complete     Is the above level different from baseline mobility prior to current illness? -- Yes - Recommend PT order Yes - Recommend PT order        Diet: Diet Orders (From admission, onward)     Start     Ordered   04/04/24 2020  Diet Carb Modified Room service appropriate? Yes  Diet effective now       Question Answer Comment  Diet-HS Snack? Nothing   Calorie Level Medium 1600-2000   Fluid consistency: Thin   Room service appropriate? Yes      04/04/24 2027            Barriers to discharge: None Disposition Plan: SNF HH orders placed: N/A Status is:  Inpatient  Objective: Blood pressure (!) 136/57, pulse 88, temperature 98 F (36.7 C), temperature source Oral, resp. rate 20, height 5' 2 (1.575 m), weight 51.2 kg, SpO2 100%.  Examination:  Physical Exam Constitutional:      Comments: Pleasantly confused resting in bed in no distress  HENT:     Head: Normocephalic.     Comments: Right forehead hematoma noted    Mouth/Throat:     Mouth: Mucous membranes are moist.  Eyes:     Extraocular Movements: Extraocular movements intact.  Cardiovascular:     Rate and Rhythm: Normal rate and regular rhythm.  Pulmonary:     Effort: Pulmonary effort is normal. No respiratory distress.     Breath sounds: Normal breath sounds. No wheezing.  Abdominal:     General: Bowel sounds are normal. There is no distension.     Palpations: Abdomen is soft.     Tenderness: There is no abdominal tenderness.  Musculoskeletal:     Cervical back: Normal range of motion and neck supple.     Right lower leg: No edema.     Left lower leg: No edema.     Comments: Left hip bruising noted; more painful and enlarging   Skin:  General: Skin is warm and dry.  Neurological:     Comments: Follows commands and moves all 4 extremities equally.  No obvious focal deficits  Psychiatric:        Mood and Affect: Mood normal.      Consultants:  Palliative care  Procedures:    Data Reviewed: Results for orders placed or performed during the hospital encounter of 04/04/24 (from the past 24 hours)  Basic metabolic panel with GFR     Status: Abnormal   Collection Time: 04/10/24  5:05 AM  Result Value Ref Range   Sodium 135 135 - 145 mmol/L   Potassium 4.2 3.5 - 5.1 mmol/L   Chloride 103 98 - 111 mmol/L   CO2 20 (L) 22 - 32 mmol/L   Glucose, Bld 489 (H) 70 - 99 mg/dL   BUN 52 (H) 8 - 23 mg/dL   Creatinine, Ser 7.68 (H) 0.44 - 1.00 mg/dL   Calcium  8.4 (L) 8.9 - 10.3 mg/dL   GFR, Estimated 19 (L) >60 mL/min   Anion gap 11 5 - 15  CBC with Differential/Platelet      Status: Abnormal   Collection Time: 04/10/24  5:05 AM  Result Value Ref Range   WBC 15.9 (H) 4.0 - 10.5 K/uL   RBC 1.98 (L) 3.87 - 5.11 MIL/uL   Hemoglobin 6.4 (LL) 12.0 - 15.0 g/dL   HCT 81.2 (L) 63.9 - 53.9 %   MCV 94.4 80.0 - 100.0 fL   MCH 32.3 26.0 - 34.0 pg   MCHC 34.2 30.0 - 36.0 g/dL   RDW 86.4 88.4 - 84.4 %   Platelets 198 150 - 400 K/uL   nRBC 0.3 (H) 0.0 - 0.2 %   Neutrophils Relative % 67 %   Neutro Abs 10.9 (H) 1.7 - 7.7 K/uL   Lymphocytes Relative 18 %   Lymphs Abs 2.8 0.7 - 4.0 K/uL   Monocytes Relative 11 %   Monocytes Absolute 1.7 (H) 0.1 - 1.0 K/uL   Eosinophils Relative 2 %   Eosinophils Absolute 0.2 0.0 - 0.5 K/uL   Basophils Relative 0 %   Basophils Absolute 0.0 0.0 - 0.1 K/uL   Immature Granulocytes 2 %   Abs Immature Granulocytes 0.25 (H) 0.00 - 0.07 K/uL  Magnesium      Status: None   Collection Time: 04/10/24  5:05 AM  Result Value Ref Range   Magnesium  1.9 1.7 - 2.4 mg/dL  Glucose, capillary     Status: Abnormal   Collection Time: 04/10/24  7:55 AM  Result Value Ref Range   Glucose-Capillary 391 (H) 70 - 99 mg/dL  Basic metabolic panel with GFR     Status: Abnormal   Collection Time: 04/10/24  8:03 AM  Result Value Ref Range   Sodium 136 135 - 145 mmol/L   Potassium 4.6 3.5 - 5.1 mmol/L   Chloride 104 98 - 111 mmol/L   CO2 17 (L) 22 - 32 mmol/L   Glucose, Bld 466 (H) 70 - 99 mg/dL   BUN 52 (H) 8 - 23 mg/dL   Creatinine, Ser 7.72 (H) 0.44 - 1.00 mg/dL   Calcium  8.5 (L) 8.9 - 10.3 mg/dL   GFR, Estimated 20 (L) >60 mL/min   Anion gap 15 5 - 15  CBC with Differential/Platelet     Status: Abnormal   Collection Time: 04/10/24 10:01 AM  Result Value Ref Range   WBC 17.1 (H) 4.0 - 10.5 K/uL   RBC 2.20 (L) 3.87 - 5.11  MIL/uL   Hemoglobin 7.1 (L) 12.0 - 15.0 g/dL   HCT 79.1 (L) 63.9 - 53.9 %   MCV 94.5 80.0 - 100.0 fL   MCH 32.3 26.0 - 34.0 pg   MCHC 34.1 30.0 - 36.0 g/dL   RDW 86.4 88.4 - 84.4 %   Platelets 237 150 - 400 K/uL   nRBC 0.4 (H)  0.0 - 0.2 %   Neutrophils Relative % 70 %   Neutro Abs 11.8 (H) 1.7 - 7.7 K/uL   Lymphocytes Relative 18 %   Lymphs Abs 3.0 0.7 - 4.0 K/uL   Monocytes Relative 9 %   Monocytes Absolute 1.6 (H) 0.1 - 1.0 K/uL   Eosinophils Relative 1 %   Eosinophils Absolute 0.2 0.0 - 0.5 K/uL   Basophils Relative 0 %   Basophils Absolute 0.1 0.0 - 0.1 K/uL   Immature Granulocytes 2 %   Abs Immature Granulocytes 0.41 (H) 0.00 - 0.07 K/uL  Glucose, capillary     Status: Abnormal   Collection Time: 04/10/24 11:14 AM  Result Value Ref Range   Glucose-Capillary 344 (H) 70 - 99 mg/dL  Glucose, capillary     Status: Abnormal   Collection Time: 04/10/24  1:48 PM  Result Value Ref Range   Glucose-Capillary 421 (H) 70 - 99 mg/dL    I have reviewed pertinent nursing notes, vitals, labs, and images as necessary. I have ordered labwork to follow up on as indicated.  I have reviewed the last notes from staff over past 24 hours. I have discussed patient's care plan and test results with nursing staff, CM/SW, and other staff as appropriate.  Old records reviewed in assessment of this patient  Time spent: Greater than 50% of the 55 minute visit was spent in counseling/coordination of care for the patient as laid out in the A&P.   LOS: 4 days   Alm Apo, MD [facility] 04/10/2024, 3:25 PM

## 2024-04-10 NOTE — TOC Progression Note (Signed)
 Transition of Care [facility]) - Progression Note    Patient Details  Name: [name] MRN: [id number] Date of Birth: July 31, 1929  Transition of [facility]) CM/SW Contact  Alfonse JONELLE Rex, RN Phone Number: 04/10/2024, 11:57 AM  Clinical Narrative:   SNF auth approved, confirmed with [facility], bed available Friday, team notified, per Attending, patient not medically ready for dc today, monitoring labs. 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               Expected Discharge Plan and Services     Post Acute Care Choice: Skilled Nursing Facility Living arrangements for the past 2 months: Single Family Home                                       Social Drivers of Health (SDOH) Interventions SDOH Screenings   Food Insecurity: No Food Insecurity (04/05/2024)  Housing: Low Risk (04/05/2024)  Transportation Needs: Patient Unable To Answer (04/05/2024)  Utilities: Patient Unable To Answer (04/05/2024)  Depression (PHQ2-9): Low Risk (09/22/2022)  Financial Resource Strain: Low Risk (12/29/2023)   Received from [facility]  Social Connections: Moderately Integrated (04/05/2024)  Tobacco Use: Low Risk (04/04/2024)    Readmission Risk Interventions    04/08/2024   10:37 AM 11/03/2022    1:11 PM  Readmission Risk Prevention Plan  Post Dischage Appt  Complete  Medication Screening  Complete  Transportation Screening Complete Complete  HRI or Home Care Consult Complete   Social Work Consult for Recovery Care Planning/Counseling Complete   Palliative Care Screening Complete   Medication Review Ocean[employer]) Complete

## 2024-04-10 NOTE — Plan of Care (Signed)
   Problem: Activity: Goal: Risk for activity intolerance will decrease Outcome: Progressing   Problem: Coping: Goal: Level of anxiety will decrease Outcome: Progressing

## 2024-04-10 NOTE — Plan of Care (Signed)
°  Problem: Education: Goal: Knowledge of General Education information will improve Description: Including pain rating scale, medication(s)/side effects and non-pharmacologic comfort measures Outcome: Progressing   Problem: Health Behavior/Discharge Planning: Goal: Ability to manage health-related needs will improve Outcome: Progressing   Problem: Clinical Measurements: Goal: Ability to maintain clinical measurements within normal limits will improve Outcome: Progressing Goal: Will remain free from infection Outcome: Progressing Goal: Diagnostic test results will improve Outcome: Progressing Goal: Respiratory complications will improve Outcome: Progressing Goal: Cardiovascular complication will be avoided Outcome: Progressing   Problem: Activity: Goal: Risk for activity intolerance will decrease Outcome: Progressing   Problem: Coping: Goal: Level of anxiety will decrease Outcome: Progressing   Problem: Pain Managment: Goal: General experience of comfort will improve and/or be controlled Outcome: Progressing   Problem: Safety: Goal: Ability to remain free from injury will improve Outcome: Progressing   Problem: Elimination: Goal: Will not experience complications related to bowel motility Outcome: Progressing Goal: Will not experience complications related to urinary retention Outcome: Progressing

## 2024-04-10 NOTE — Progress Notes (Signed)
 Physical Therapy Treatment Patient Details Name: [name] MRN: [id number] DOB: 1929/09/07 Today's Date: 04/10/2024   History of Present Illness Patient is a 89 year old female who presented to [facility] on 04/04/2024 with dx AKI, acute metabolic encephalopathy/hallucinations. Was falling out of bed at home and family reports decline, has been hallucinating since she had a stroke admission almost 3 weeks back.  Concern per Lewy body dementia vs vascular dementia and no new acute findings with brain MRI.  PMH: CVA, hypokalemia, CKD III, DM II, HTN, and HLD.    PT Comments   Pt admitted with above diagnosis.  Pt currently with functional limitations due to the deficits listed below (see PT Problem List). PT arrived and pt in bed. Son present for a portion of intervention. Nurse present as well as nurse tech for glucose check--344. Pt expressed L LE pain while at rest, nurse administered pain medication during therapy session. Pt sustained fall OOB 12/15 in which pt was found on the floor, pt struck head and has a hematoma along R side of forehead, small R distal lateral LE wound and new onset of L hip edema, extensive bruising laterally and medially with pain reported with palpation. Pt is able to perform active L hip flexion and extension with reports of pain, unable to tolerate passive hip IR or ER at this time. PT concerned per pt pain report of 8/10 and recent decreased in Hgb from 11.1 to 6.4 and at time of intervention 7.1. PT limited treatment session due to pain, lab findings and pt response, discussed with pt, nurse and son per requesting imaging of L hip.  Pt left in bed, all needs in place, nurse present and son in hallway.  Patient will benefit from continued inpatient follow up therapy, <3 hours/day. Pt will benefit from acute skilled PT to increase their independence and safety with mobility to allow discharge.      If plan is discharge home, recommend the following: Assistance with  cooking/housework;Assist for transportation;Help with stairs or ramp for entrance;Two people to help with walking and/or transfers;A lot of help with bathing/dressing/bathroom   Can travel by private vehicle     No  Equipment Recommendations  None recommended by PT    Recommendations for Other Services       Precautions / Restrictions Precautions Precautions: Fall Recall of Precautions/Restrictions: Impaired Restrictions Weight Bearing Restrictions Per Provider Order: No     Mobility  Bed Mobility Overal bed mobility: Needs Assistance Bed Mobility: Rolling Rolling: Min assist, Used rails         General bed mobility comments: increased time, mulimodal cues and min A for rolling side to side, PT limited mobiltiy today due to new onset of L hip pain with extensive burising and discoloration    Transfers                        Ambulation/Gait                   Stairs             Wheelchair Mobility     Tilt Bed    Modified Rankin (Stroke Patients Only)       Balance Overall balance assessment: Needs assistance  Communication Communication Communication: No apparent difficulties Factors Affecting Communication: Difficulty expressing self (intermittent word finding difficulty)  Cognition Arousal: Alert Behavior During Therapy: WFL for tasks assessed/performed   PT - Cognitive impairments: Orientation, Memory, Safety/Judgement, Problem solving, Sequencing   Orientation impairments: Person, Place                   PT - Cognition Comments: pt presents with improved alertness and orientation as well as ability to express self and communicate needs Following commands: Impaired Following commands impaired: Follows one step commands with increased time    Cueing Cueing Techniques: Verbal cues, Gestural cues, Tactile cues, Visual cues  Exercises      General Comments         Pertinent Vitals/Pain Pain Assessment Pain Assessment: 0-10 Pain Score: 8  Pain Location: L hip Pain Descriptors / Indicators: Aching, Constant, Discomfort, Grimacing, Guarding, Sore, Tender, Tightness Pain Intervention(s): Limited activity within patient's tolerance, Monitored during session, Repositioned, Patient requesting pain meds-RN notified, RN gave pain meds during session    Home Living                          Prior Function            PT Goals (current goals can now be found in the care plan section) Acute Rehab PT Goals Patient Stated Goal: go home PT Goal Formulation: With patient/family Time For Goal Achievement: 04/19/24 Potential to Achieve Goals: Good Progress towards PT goals: Not progressing toward goals - comment    Frequency    Min 3X/week      PT Plan      Co-evaluation              AM-PAC PT 6 Clicks Mobility   Outcome Measure  Help needed turning from your back to your side while in a flat bed without using bedrails?: A Little Help needed moving from lying on your back to sitting on the side of a flat bed without using bedrails?: Total Help needed moving to and from a bed to a chair (including a wheelchair)?: Total Help needed standing up from a chair using your arms (e.g., wheelchair or bedside chair)?: Total Help needed to walk in hospital room?: Total Help needed climbing 3-5 steps with a railing? : Total 6 Click Score: 8    End of Session   Activity Tolerance: Patient limited by fatigue;Patient limited by pain Patient left: with call bell/phone within reach;with nursing/sitter in room;in bed Nurse Communication: Mobility status;Other (comment);Patient requests pain meds (L hip pain) PT Visit Diagnosis: Unsteadiness on feet (R26.81);Difficulty in walking, not elsewhere classified (R26.2);Repeated falls (R29.6);Muscle weakness (generalized) (M62.81);Other abnormalities of gait and mobility (R26.89)     Time:  8893-8873 PT Time Calculation (min) (ACUTE ONLY): 20 min  Charges:    $Therapeutic Activity: 8-22 mins PT General Charges $$ ACUTE PT VISIT: 1 Visit                     [place], PT Acute Rehab    [place] VEAR Drone 04/10/2024, 12:02 PM

## 2024-04-10 NOTE — Progress Notes (Addendum)
 OT Cancellation Note  Patient Details Name: [name] MRN: [id number] DOB: [birth year]   Cancelled Treatment:    Reason Eval/Treat Not Completed: Other (comment) Per PT note, patient with significant bruising at L hip and decreased ability to participate in therapy despite pain medication. MD was contacted, and OT is awaiting determination for the need for further imaging. OT will follow back at a later date to complete OT treatment.   Ronal Gift E. Mack Alvidrez, OTR/L Acute Rehabilitation Services [phone]   Ronal Gift Salt 04/10/2024, 2:28 PM

## 2024-04-10 NOTE — Plan of Care (Signed)

## 2024-04-11 DIAGNOSIS — D62 Acute posthemorrhagic anemia: Secondary | ICD-10-CM

## 2024-04-11 DIAGNOSIS — S7002XA Contusion of left hip, initial encounter: Secondary | ICD-10-CM

## 2024-04-11 DIAGNOSIS — W19XXXA Unspecified fall, initial encounter: Secondary | ICD-10-CM | POA: Insufficient documentation

## 2024-04-11 LAB — BASIC METABOLIC PANEL WITH GFR
Anion gap: 11 (ref 5–15)
BUN: 43 mg/dL — ABNORMAL HIGH (ref 8–23)
CO2: 19 mmol/L — ABNORMAL LOW (ref 22–32)
Calcium: 8.8 mg/dL — ABNORMAL LOW (ref 8.9–10.3)
Chloride: 104 mmol/L (ref 98–111)
Creatinine, Ser: 1.75 mg/dL — ABNORMAL HIGH (ref 0.44–1.00)
GFR, Estimated: 27 mL/min — ABNORMAL LOW (ref >60)
Glucose, Bld: 333 mg/dL — ABNORMAL HIGH (ref 70–99)
Potassium: 4 mmol/L (ref 3.5–5.1)
Sodium: 134 mmol/L — ABNORMAL LOW (ref 135–145)

## 2024-04-11 LAB — CBC WITH DIFFERENTIAL/PLATELET
Abs Immature Granulocytes: 0.28 K/uL — ABNORMAL HIGH (ref 0.00–0.07)
Basophils Absolute: 0.1 K/uL (ref 0.0–0.1)
Basophils Relative: 1 %
Eosinophils Absolute: 0.5 K/uL (ref 0.0–0.5)
Eosinophils Relative: 3 %
HCT: 25.6 % — ABNORMAL LOW (ref 36.0–46.0)
Hemoglobin: 8.8 g/dL — ABNORMAL LOW (ref 12.0–15.0)
Immature Granulocytes: 2 %
Lymphocytes Relative: 18 %
Lymphs Abs: 2.7 K/uL (ref 0.7–4.0)
MCH: 32.5 pg (ref 26.0–34.0)
MCHC: 34.4 g/dL (ref 30.0–36.0)
MCV: 94.5 fL (ref 80.0–100.0)
Monocytes Absolute: 1.4 K/uL — ABNORMAL HIGH (ref 0.1–1.0)
Monocytes Relative: 9 %
Neutro Abs: 9.9 K/uL — ABNORMAL HIGH (ref 1.7–7.7)
Neutrophils Relative %: 67 %
Platelets: 235 K/uL (ref 150–400)
RBC: 2.71 MIL/uL — ABNORMAL LOW (ref 3.87–5.11)
RDW: 14.5 % (ref 11.5–15.5)
WBC: 14.9 K/uL — ABNORMAL HIGH (ref 4.0–10.5)
nRBC: 1.1 % — ABNORMAL HIGH (ref 0.0–0.2)

## 2024-04-11 LAB — GLUCOSE, CAPILLARY
Glucose-Capillary: 245 mg/dL — ABNORMAL HIGH (ref 70–99)
Glucose-Capillary: 329 mg/dL — ABNORMAL HIGH (ref 70–99)
Glucose-Capillary: 315 mg/dL — ABNORMAL HIGH (ref 70–99)
Glucose-Capillary: 355 mg/dL — ABNORMAL HIGH (ref 70–99)

## 2024-04-11 LAB — MAGNESIUM: Magnesium: 1.9 mg/dL (ref 1.7–2.4)

## 2024-04-11 LAB — HEMOGLOBIN AND HEMATOCRIT, BLOOD
HCT: 25.8 % — ABNORMAL LOW (ref 36.0–46.0)
Hemoglobin: 8.8 g/dL — ABNORMAL LOW (ref 12.0–15.0)

## 2024-04-11 MED ORDER — HEPARIN SODIUM (PORCINE) 5000 UNIT/ML IJ SOLN
5000.0000 [IU] | Freq: Three times a day (TID) | INTRAMUSCULAR | Status: DC
  Administered 2024-04-11 – 2024-04-12 (×3): 5000 [IU] via SUBCUTANEOUS
  Filled 2024-04-11 (×4): qty 1

## 2024-04-11 NOTE — Progress Notes (Signed)
 Progress Note    [name]   FMW:[id number]  DOB: [birth year]  DOA: 04/04/2024     5 PCP: [name], NP  Initial CC: Weakness  Hospital Course: [name] is a 89 year old female with likely previously undiagnosed dementia, DMII, neuropathy, chronic back pain, vitamin D  deficiency, HTN, HLD, CVA. She was admitted with generalized weakness, confusion, visual hallucinations, poor intake, falls at home. Creatinine on admission 3.6, BUN 85. She was started on IV fluids and admitted for further workup.     Assessment & Plan:    ABLA Normocytic anemia Fall -Patient had fall out of bed on night of 04/08/2024.  Sustained right forehead hematoma and developed large bruising on left hip - Hgb had precipitous drop today, confirmed on labs - CT head, left hip, A/P performed for further assessment - Large left hip subcutaneous hematoma measuring 12.4 x 7.8 x 5.1 cm superficial to the left gluteus maximus and left IT band; discussed findings with son bedside and patient.  CT head showing right frontal scalp hematoma with no underlying fracture - Patient amenable with blood transfusion.   - Hgb stable and improving post PRBC - okay to resume HSQ - repeat Hgb in am  Acute metabolic encephalopathy Hallucinations Possible previously undiagnosed dementia Failure to thrive - probable combo of uremia from dehydration/poor intake with background of stroke history, frailty, and dementia  -MRI brain did not reveal any acute findings - hx CVA 03/18/24 with ongoing decline since - Chest x-ray was nonrevealing. - Urine culture was not significant.   - family reported long-term memory loss, visual hallucinations, falls and failure to thrive - mentation has improved with IVF and improvement in renal function  - Continue nightly Seroquel  - Continue encouraging good intake - PT/OT evaluations, plan is for SNF - Eventually needs at least palliative care when going home from rehab  AKI on CKD  3B -Likely multifactorial - Baseline CKD 3B - Poor p.o. intake is reported - Recent UTI - Recent Bactrim  use. -Creatinine 3.6 on admission.  Has steadily improved with fluids   Diabetes mellitus type 2 with hyperglycemia: A1c of 7.2% - Continue diet control   Mild elevated troponin -Likely type II elevation - Advanced chronic kidney disease at baseline, with acute kidney injury is also noted. -No chest pain or shortness of breath reported.    Mild leukocytosis - Chronic and intermittent - No constitutional symptoms endorsed - Negative cultures   History CVA - MRI brain 03/18/2024 with acute infarct involving left parietal lobe.  Moderate chronic microvascular changes and remote infarcts noted right frontal periventricular white matter, right thalamus with associated chronic microhemorrhage, and left cerebellum   Generalized weakness, deconditioning, falls, ambulatory dysfunction: - History of falling; probable contribution from prior left cerebellar stroke as well as deconditioning and dehydration; overall failure to thrive -PT/OT evals, awaiting SNF placement   Goals of care - Patient is DNR. - Palliative care consult to define goals of care -Home with palliative care after rehab  Interval History:  No events overnight.  Resting comfortably in bed this morning.  Son present bedside.  Mentation stable and no worsening of hip pain.  Bruising stable.  Antimicrobials:   DVT prophylaxis:  heparin  injection 5,000 Units Start: 04/11/24 1515 SCDs Start: 04/04/24 2019   Code Status:   Code Status: Limited: Do not attempt resuscitation (DNR) -DNR-LIMITED -Do Not Intubate/DNI   Mobility Assessment (Last 72 Hours)     Mobility Assessment     Row Name 04/11/24 [phone]  04/10/24 2100 04/10/24 1100 04/10/24 0815 04/09/24 1908   Does the patient have exclusion criteria? Yes- Hold (Level 0) - Assessment complete Yes- Hold (Level 0) - Assessment complete -- Yes- Hold (Level 0) -  Assessment complete No- Perform mobility assessment   What is the highest level of mobility based on the mobility assessment? Level 4 (Ambulates with assistance) - Balance while stepping forward/back - Complete Level 2 (Chairfast) - Balance while sitting on edge of bed and cannot stand Level 1 (Bedfast) - Unable to balance while sitting on edge of bed Level 4 (Ambulates with assistance) - Balance while stepping forward/back - Complete Level 4 (Ambulates with assistance) - Balance while stepping forward/back - Complete   Is the above level different from baseline mobility prior to current illness? Yes - Recommend PT order Yes - Recommend PT order -- Yes - Recommend PT order Yes - Recommend PT order    Row Name 04/09/24 0803 04/08/24 2100         Does the patient have exclusion criteria? No- Perform mobility assessment No- Perform mobility assessment      What is the highest level of mobility based on the mobility assessment? Level 4 (Ambulates with assistance) - Balance while stepping forward/back - Complete Level 4 (Ambulates with assistance) - Balance while stepping forward/back - Complete      Is the above level different from baseline mobility prior to current illness? Yes - Recommend PT order Yes - Recommend PT order         Diet: Diet Orders (From admission, onward)     Start     Ordered   04/04/24 2020  Diet Carb Modified Room service appropriate? Yes  Diet effective now       Question Answer Comment  Diet-HS Snack? Nothing   Calorie Level Medium 1600-2000   Fluid consistency: Thin   Room service appropriate? Yes      04/04/24 2027            Barriers to discharge: None Disposition Plan: SNF HH orders placed: N/A Status is: Inpatient  Objective: Blood pressure (!) 124/43, pulse 90, temperature 98.1 F (36.7 C), temperature source Oral, resp. rate 15, height 5' 2 (1.575 m), weight 51 kg, SpO2 100%.  Examination:  Physical Exam Constitutional:      Comments:  Pleasantly confused resting in bed in no distress  HENT:     Head: Normocephalic.     Comments: Right forehead hematoma noted    Mouth/Throat:     Mouth: Mucous membranes are moist.  Eyes:     Extraocular Movements: Extraocular movements intact.  Cardiovascular:     Rate and Rhythm: Normal rate and regular rhythm.  Pulmonary:     Effort: Pulmonary effort is normal. No respiratory distress.     Breath sounds: Normal breath sounds. No wheezing.  Abdominal:     General: Bowel sounds are normal. There is no distension.     Palpations: Abdomen is soft.     Tenderness: There is no abdominal tenderness.  Musculoskeletal:     Cervical back: Normal range of motion and neck supple.     Right lower leg: No edema.     Left lower leg: No edema.     Comments: Left hip bruising noted; stable tenderness and no spreading of bruising  Skin:    General: Skin is warm and dry.  Neurological:     Comments: Follows commands and moves all 4 extremities equally.  No obvious focal deficits  Psychiatric:        Mood and Affect: Mood normal.      Consultants:  Palliative care  Procedures:    Data Reviewed: Results for orders placed or performed during the hospital encounter of 04/04/24 (from the past 24 hours)  Type and screen [facility]     Status: None (Preliminary result)   Collection Time: 04/10/24  4:06 PM  Result Value Ref Range   ABO/RH(D) AB POS    Antibody Screen NEG    Sample Expiration 04/13/2024,2359    Antibody Identification ANTI A1    PT AG Type      NEGATIVE FOR A1 ANTIGEN Performed at [facility], [address] [place], [place] [address]    Unit Number [id number]    Blood Component Type RBC LR PHER2    Unit division 00    Status of Unit ISSUED    Transfusion Status OK TO TRANSFUSE    Crossmatch Result COMPATIBLE   Prepare RBC (crossmatch)     Status: None   Collection Time: 04/10/24  4:06 PM  Result Value Ref Range   Order  Confirmation      ORDER PROCESSED BY BLOOD BANK Performed at [facility], [address], [place], [place] [address]   Glucose, capillary     Status: Abnormal   Collection Time: 04/10/24  4:47 PM  Result Value Ref Range   Glucose-Capillary 364 (H) 70 - 99 mg/dL   Comment 1 Notify RN    Comment 2 Document in Chart   Glucose, capillary     Status: Abnormal   Collection Time: 04/10/24  9:21 PM  Result Value Ref Range   Glucose-Capillary 254 (H) 70 - 99 mg/dL  Glucose, capillary     Status: Abnormal   Collection Time: 04/11/24  7:49 AM  Result Value Ref Range   Glucose-Capillary 245 (H) 70 - 99 mg/dL  Basic metabolic panel with GFR     Status: Abnormal   Collection Time: 04/11/24 10:17 AM  Result Value Ref Range   Sodium 134 (L) 135 - 145 mmol/L   Potassium 4.0 3.5 - 5.1 mmol/L   Chloride 104 98 - 111 mmol/L   CO2 19 (L) 22 - 32 mmol/L   Glucose, Bld 333 (H) 70 - 99 mg/dL   BUN 43 (H) 8 - 23 mg/dL   Creatinine, Ser 8.24 (H) 0.44 - 1.00 mg/dL   Calcium  8.8 (L) 8.9 - 10.3 mg/dL   GFR, Estimated 27 (L) >60 mL/min   Anion gap 11 5 - 15  CBC with Differential/Platelet     Status: Abnormal   Collection Time: 04/11/24 10:17 AM  Result Value Ref Range   WBC 14.9 (H) 4.0 - 10.5 K/uL   RBC 2.71 (L) 3.87 - 5.11 MIL/uL   Hemoglobin 8.8 (L) 12.0 - 15.0 g/dL   HCT 74.3 (L) 63.9 - 53.9 %   MCV 94.5 80.0 - 100.0 fL   MCH 32.5 26.0 - 34.0 pg   MCHC 34.4 30.0 - 36.0 g/dL   RDW 85.4 88.4 - 84.4 %   Platelets 235 150 - 400 K/uL   nRBC 1.1 (H) 0.0 - 0.2 %   Neutrophils Relative % 67 %   Neutro Abs 9.9 (H) 1.7 - 7.7 K/uL   Lymphocytes Relative 18 %   Lymphs Abs 2.7 0.7 - 4.0 K/uL   Monocytes Relative 9 %   Monocytes Absolute 1.4 (H) 0.1 - 1.0 K/uL   Eosinophils  Relative 3 %   Eosinophils Absolute 0.5 0.0 - 0.5 K/uL   Basophils Relative 1 %   Basophils Absolute 0.1 0.0 - 0.1 K/uL   Immature Granulocytes 2 %   Abs Immature Granulocytes 0.28 (H) 0.00 - 0.07 K/uL   Magnesium      Status: None   Collection Time: 04/11/24 10:17 AM  Result Value Ref Range   Magnesium  1.9 1.7 - 2.4 mg/dL  Hemoglobin and hematocrit, blood     Status: Abnormal   Collection Time: 04/11/24 10:17 AM  Result Value Ref Range   Hemoglobin 8.8 (L) 12.0 - 15.0 g/dL   HCT 74.1 (L) 63.9 - 53.9 %  Glucose, capillary     Status: Abnormal   Collection Time: 04/11/24 11:48 AM  Result Value Ref Range   Glucose-Capillary 329 (H) 70 - 99 mg/dL    I have reviewed pertinent nursing notes, vitals, labs, and images as necessary. I have ordered labwork to follow up on as indicated.  I have reviewed the last notes from staff over past 24 hours. I have discussed patient's care plan and test results with nursing staff, CM/SW, and other staff as appropriate.  Old records reviewed in assessment of this patient  Time spent: Greater than 50% of the 55 minute visit was spent in counseling/coordination of care for the patient as laid out in the A&P.   LOS: 5 days   Alm Apo, MD [facility] 04/11/2024, 2:28 PM

## 2024-04-11 NOTE — Plan of Care (Signed)
°  Problem: Activity: Goal: Risk for activity intolerance will decrease Outcome: Progressing   Problem: Pain Managment: Goal: General experience of comfort will improve and/or be controlled Outcome: Progressing   Problem: Nutritional: Goal: Maintenance of adequate nutrition will improve Outcome: Progressing

## 2024-04-12 DIAGNOSIS — G9341 Metabolic encephalopathy: Secondary | ICD-10-CM | POA: Diagnosis not present

## 2024-04-12 DIAGNOSIS — I129 Hypertensive chronic kidney disease with stage 1 through stage 4 chronic kidney disease, or unspecified chronic kidney disease: Secondary | ICD-10-CM

## 2024-04-12 DIAGNOSIS — S7002XA Contusion of left hip, initial encounter: Secondary | ICD-10-CM | POA: Diagnosis not present

## 2024-04-12 DIAGNOSIS — Z8673 Personal history of transient ischemic attack (TIA), and cerebral infarction without residual deficits: Secondary | ICD-10-CM

## 2024-04-12 DIAGNOSIS — R410 Disorientation, unspecified: Secondary | ICD-10-CM

## 2024-04-12 DIAGNOSIS — N1832 Chronic kidney disease, stage 3b: Secondary | ICD-10-CM | POA: Diagnosis not present

## 2024-04-12 DIAGNOSIS — D62 Acute posthemorrhagic anemia: Secondary | ICD-10-CM | POA: Diagnosis not present

## 2024-04-12 DIAGNOSIS — E1140 Type 2 diabetes mellitus with diabetic neuropathy, unspecified: Secondary | ICD-10-CM

## 2024-04-12 DIAGNOSIS — N189 Chronic kidney disease, unspecified: Secondary | ICD-10-CM

## 2024-04-12 DIAGNOSIS — E1122 Type 2 diabetes mellitus with diabetic chronic kidney disease: Secondary | ICD-10-CM

## 2024-04-12 DIAGNOSIS — W19XXXD Unspecified fall, subsequent encounter: Secondary | ICD-10-CM

## 2024-04-12 DIAGNOSIS — N179 Acute kidney failure, unspecified: Secondary | ICD-10-CM | POA: Diagnosis not present

## 2024-04-12 LAB — CBC WITH DIFFERENTIAL/PLATELET
Abs Immature Granulocytes: 0.53 K/uL — ABNORMAL HIGH (ref 0.00–0.07)
Basophils Absolute: 0.1 K/uL (ref 0.0–0.1)
Basophils Relative: 1 %
Eosinophils Absolute: 0.6 K/uL — ABNORMAL HIGH (ref 0.0–0.5)
Eosinophils Relative: 4 %
HCT: 26.5 % — ABNORMAL LOW (ref 36.0–46.0)
Hemoglobin: 8.9 g/dL — ABNORMAL LOW (ref 12.0–15.0)
Immature Granulocytes: 3 %
Lymphocytes Relative: 28 %
Lymphs Abs: 4.6 K/uL — ABNORMAL HIGH (ref 0.7–4.0)
MCH: 31.9 pg (ref 26.0–34.0)
MCHC: 33.6 g/dL (ref 30.0–36.0)
MCV: 95 fL (ref 80.0–100.0)
Monocytes Absolute: 2 K/uL — ABNORMAL HIGH (ref 0.1–1.0)
Monocytes Relative: 13 %
Neutro Abs: 8.5 K/uL — ABNORMAL HIGH (ref 1.7–7.7)
Neutrophils Relative %: 51 %
Platelets: 305 K/uL (ref 150–400)
RBC: 2.79 MIL/uL — ABNORMAL LOW (ref 3.87–5.11)
RDW: 14.6 % (ref 11.5–15.5)
WBC: 16.4 K/uL — ABNORMAL HIGH (ref 4.0–10.5)
nRBC: 0.8 % — ABNORMAL HIGH (ref 0.0–0.2)

## 2024-04-12 LAB — BASIC METABOLIC PANEL WITH GFR
Anion gap: 11 (ref 5–15)
BUN: 36 mg/dL — ABNORMAL HIGH (ref 8–23)
CO2: 23 mmol/L (ref 22–32)
Calcium: 8.7 mg/dL — ABNORMAL LOW (ref 8.9–10.3)
Chloride: 103 mmol/L (ref 98–111)
Creatinine, Ser: 1.84 mg/dL — ABNORMAL HIGH (ref 0.44–1.00)
GFR, Estimated: 25 mL/min — ABNORMAL LOW
Glucose, Bld: 297 mg/dL — ABNORMAL HIGH (ref 70–99)
Potassium: 4 mmol/L (ref 3.5–5.1)
Sodium: 136 mmol/L (ref 135–145)

## 2024-04-12 LAB — TYPE AND SCREEN
ABO/RH(D): AB POS
Antibody Screen: NEGATIVE
PT AG Type: NEGATIVE
Unit division: 0

## 2024-04-12 LAB — BPAM RBC
Blood Product Expiration Date: 202601152359
ISSUE DATE / TIME: 202512180050
Unit Type and Rh: 202601152359
Unit Type and Rh: 5100

## 2024-04-12 LAB — MAGNESIUM: Magnesium: 2.1 mg/dL (ref 1.7–2.4)

## 2024-04-12 LAB — GLUCOSE, CAPILLARY
Glucose-Capillary: 308 mg/dL — ABNORMAL HIGH (ref 70–99)
Glucose-Capillary: 374 mg/dL — ABNORMAL HIGH (ref 70–99)

## 2024-04-12 MED ORDER — CEPHALEXIN 250 MG PO CAPS
250.0000 mg | ORAL_CAPSULE | Freq: Every day | ORAL | Status: DC
  Administered 2024-04-12: 250 mg via ORAL
  Filled 2024-04-12 (×2): qty 1

## 2024-04-12 MED ORDER — BASAGLAR KWIKPEN 100 UNIT/ML ~~LOC~~ SOPN: 7.0000 [IU] | PEN_INJECTOR | Freq: Every evening | SUBCUTANEOUS | Status: DC | PRN

## 2024-04-12 MED ORDER — ASPIRIN 81 MG PO TBEC: 81.0000 mg | DELAYED_RELEASE_TABLET | Freq: Every day | ORAL | Status: DC

## 2024-04-12 MED ORDER — QUETIAPINE FUMARATE 25 MG PO TABS: 12.5000 mg | ORAL_TABLET | Freq: Every day | ORAL | Status: AC

## 2024-04-12 MED ORDER — TRAMADOL HCL 50 MG PO TABS: 50.0000 mg | ORAL_TABLET | Freq: Two times a day (BID) | ORAL | 0 refills | Status: AC | PRN

## 2024-04-12 MED ORDER — INSULIN GLARGINE 100 UNIT/ML ~~LOC~~ SOLN
7.0000 [IU] | Freq: Every day | SUBCUTANEOUS | Status: DC
  Administered 2024-04-12: 7 [IU] via SUBCUTANEOUS
  Filled 2024-04-12: qty 0.07

## 2024-04-12 NOTE — Progress Notes (Signed)
 Palliative:  HPI:  89 y.o. female  with past medical history of hypertension, hyperlipidemia, type two diabetes mellitus with neuropathy, CKD, and previous stroke (03/18/24) admitted on 04/04/2024 with generalized weakness, poor oral intake, nausea and vomiting, confusion, visual hallucinations, and falls at home. Concern for Lewy body dementia vs vascular dementia and no new acute findings with brain MRI.    I met today at [name]'s bedside along with son, Arley. Ms. Darrough awakens and interacts. In overall good spirits with underlying confusion. Grimace and pain with movement - RN to give Tylenol .   I spent time discussing with [facility] palliative care. He shares how he and his brothers rotate to care for their mother at home. He reports that they did consider ALF at one point but decided to keep her home - not sure this would be enough for her at this stage. Determined it will be good to monitor how she progresses during rehab. We discussed obstacles to improvement including dementia, renal function, and debility. We discussed that such an acute illness can have significant effects on how she does from here. He expresses hope for improvement but does reflect that she has had a good life. He shares about his father who died suddenly in rehab (actually at [facility] as well) but that he had a quick and painless death. He is hopeful that when his mother's time comes it is quick and painless as well.   She is eating and drinking well. Overall doing much better. Arley is pleased with her progress. She was one of 14 siblings. She worked all her life and was not one to stay at home.They are a family of strong faith.   All questions/concerns addressed. Emotional support provided.   Exam: Alert, appropriate. Underlying confusion/disorientation. Thinks her son is her husband at times. No distress. Breathing regular, unlabored. Abd soft.   Plan: - DNR in place - MOST completed with my colleague previously - SNF  rehab with palliative to follow  40 min  Bernarda Kitty, NP Palliative Medicine Team Pager [phone] (Please see [email or web address] for schedule) Team Phone [phone]

## 2024-04-12 NOTE — Plan of Care (Signed)
   Problem: Coping: Goal: Level of anxiety will decrease Outcome: Progressing

## 2024-04-12 NOTE — Progress Notes (Signed)
 Occupational Therapy Treatment Patient Details Name: [name] MRN: [id number] DOB: 09/06/1929 Today's Date: 04/12/2024   History of present illness Patient is a 89 year old female who presented to [facility] on 04/04/2024 with dx AKI, acute metabolic encephalopathy/hallucinations. Was falling out of bed at home and family reports decline, has been hallucinating since she had a stroke admission almost 3 weeks back.  Concern per Lewy body dementia vs vascular dementia and no new acute findings with brain MRI. Fall out of bed in hospital, L hip hematoma and head hematoma - imaging negative for acute findings PMH: CVA, hypokalemia, CKD III, DM II, HTN, and HLD.   OT comments  Pt progressing towards OT goals this session. While L hip and head are sore pain was much improved. She was mod to max A +2 for bed mobility, able to engage in grooming and bathing EOB and after min A +2 transfer to [facility]. Max A for clothing management but set up for peri care with lateral leans. Pt cognition also improving from previous session. Pt continues to require post-acute therapy at rehab of <3 hours daily.       If plan is discharge home, recommend the following:  Assistance with cooking/housework;Direct supervision/assist for medications management;Direct supervision/assist for financial management;Assist for transportation;Supervision due to cognitive status;A lot of help with walking and/or transfers;A lot of help with bathing/dressing/bathroom;Assistance with feeding   Equipment Recommendations  Other (comment) (defer to next venue of care)    Recommendations for Other Services      Precautions / Restrictions Precautions Precautions: Fall Recall of Precautions/Restrictions: Impaired Restrictions Weight Bearing Restrictions Per Provider Order: No       Mobility Bed Mobility Overal bed mobility: Needs Assistance Bed Mobility: Supine to Sit, Sit to Supine     Supine to sit: Mod assist, +2 for physical  assistance, +2 for safety/equipment, HOB elevated (use of pad to facilitate hips EOB) Sit to supine: Max assist, +2 for physical assistance, +2 for safety/equipment (helicopter technique)   General bed mobility comments: increased time, multimodal cues, Pt attempts to assist, but therapist perform >75% of work    Transfers Overall transfer level: Needs assistance Equipment used: Rolling walker (2 wheels) Transfers: Sit to/from Stand, Bed to chair/wheelchair/BSC Sit to Stand: Min assist, +2 safety/equipment     Step pivot transfers: +2 safety/equipment, Contact guard assist     General transfer comment: min A for pull to stand from EOB x 2, once standing min A for stability and pt able to perform sit to stand from commode with CGA x 2     Balance Overall balance assessment: Needs assistance Sitting-balance support: Feet supported Sitting balance-Leahy Scale: Fair Sitting balance - Comments: seated EOB and when on commode R lateral and posterior lean   Standing balance support: Bilateral upper extremity supported, During functional activity, Reliant on assistive device for balance Standing balance-Leahy Scale: Poor Standing balance comment: B UE support and CGA to min A                           ADL either performed or assessed with clinical judgement   ADL Overall ADL's : Needs assistance/impaired Eating/Feeding: Minimal assistance;Sitting   Grooming: Wash/dry hands;Wash/dry face;Set up;Sitting Grooming Details (indicate cue type and reason): while sitting on BSC and EOB Upper Body Bathing: Moderate assistance Upper Body Bathing Details (indicate cue type and reason): for back     Upper Body Dressing : Minimal assistance;Sitting Upper Body  Dressing Details (indicate cue type and reason): new gown Lower Body Dressing: Maximal assistance Lower Body Dressing Details (indicate cue type and reason): socks Toilet Transfer: Minimal assistance;+2 for  safety/equipment;Stand-pivot;BSC/3in1;Rolling walker (2 wheels) Toilet Transfer Details (indicate cue type and reason): min A for boost Toileting- Clothing Manipulation and Hygiene: Set up;Sitting/lateral lean Toileting - Clothing Manipulation Details (indicate cue type and reason): front peri care     Functional mobility during ADLs: Rolling walker (2 wheels);Cueing for safety;Minimal assistance;Cueing for sequencing;+2 for safety/equipment General ADL Comments: improved mobility from previous session, continues with decreased activity tolerance, balance    Extremity/Trunk Assessment Upper Extremity Assessment Upper Extremity Assessment: Generalized weakness   Lower Extremity Assessment Lower Extremity Assessment: Defer to PT evaluation        Vision       Perception     Praxis     Communication Communication Communication: No apparent difficulties Factors Affecting Communication:  (intermittent word finding)   Cognition Arousal: Alert Behavior During Therapy: WFL for tasks assessed/performed Cognition: Cognition impaired       Memory impairment (select all impairments): Short-term memory, Working memory Attention impairment (select first level of impairment): Sustained attention Executive functioning impairment (select all impairments): Initiation, Reasoning OT - Cognition Comments: improving cognition from baseline, still waits for cues from therapist                 Following commands: Intact        Cueing   Cueing Techniques: Verbal cues, Gestural cues, Tactile cues, Visual cues  Exercises      Shoulder Instructions       General Comments      Pertinent Vitals/ Pain       Pain Assessment Pain Assessment: Faces Faces Pain Scale: Hurts little more Pain Location: L hip/head Pain Descriptors / Indicators: Aching, Constant, Discomfort, Grimacing, Guarding, Sore, Tender, Tightness Pain Intervention(s): Limited activity within patient's tolerance,  Monitored during session, Repositioned  Home Living                                          Prior Functioning/Environment              Frequency  Min 2X/week        Progress Toward Goals  OT Goals(current goals can now be found in the care plan section)  Progress towards OT goals: Progressing toward goals  Acute Rehab OT Goals Patient Stated Goal: get back to independent OT Goal Formulation: With patient/family Time For Goal Achievement: 04/19/24 Potential to Achieve Goals: Good  Plan      Co-evaluation    PT/OT/SLP Co-Evaluation/Treatment: Yes Reason for Co-Treatment: Complexity of the patient's impairments (multi-system involvement);Necessary to address cognition/behavior during functional activity;For patient/therapist safety (recently unable to get OOB due to hospital fall) PT goals addressed during session: Mobility/safety with mobility;Balance;Proper use of DME;Strengthening/ROM OT goals addressed during session: ADL's and self-care;Proper use of Adaptive equipment and DME;Strengthening/ROM      AM-PAC OT 6 Clicks Daily Activity     Outcome Measure   Help from another person eating meals?: A Little Help from another person taking care of personal grooming?: A Little Help from another person toileting, which includes using toliet, bedpan, or urinal?: A Lot Help from another person bathing (including washing, rinsing, drying)?: A Lot Help from another person to put on and taking off regular upper body clothing?: A Little Help from  another person to put on and taking off regular lower body clothing?: A Lot 6 Click Score: 15    End of Session Equipment Utilized During Treatment: Gait belt;Rolling walker (2 wheels)  OT Visit Diagnosis: Unsteadiness on feet (R26.81);History of falling (Z91.81);Muscle weakness (generalized) (M62.81);Pain Pain - Right/Left: Left Pain - part of body: Hip   Activity Tolerance Patient tolerated treatment  well   Patient Left in bed;with call bell/phone within reach;with bed alarm set;with family/visitor present   Nurse Communication Mobility status        Time: 8882-8856 OT Time Calculation (min): 26 min  Charges: OT General Charges $OT Visit: 1 Visit OT Treatments $Self Care/Home Management : 8-22 mins Leita DEL OTR/L Acute Rehabilitation Services Office: [phone]  Leita PARAS [facility] 04/12/2024, 12:19 PM

## 2024-04-12 NOTE — Progress Notes (Addendum)
 Physical Therapy Treatment Patient Details Name: [name] MRN: [id number] DOB: 27-Jan-1930 Today's Date: 04/12/2024   History of Present Illness Patient is a 89 year old female who presented to [facility] on 04/04/2024 with dx AKI, acute metabolic encephalopathy/hallucinations. Was falling out of bed at home and family reports decline, has been hallucinating since she had a stroke admission almost 3 weeks back.  Concern per Lewy body dementia vs vascular dementia and no new acute findings with brain MRI. Fall out of bed in hospital, L hip hematoma and head hematoma - imaging negative for acute findings PMH: CVA, hypokalemia, CKD III, DM II, HTN, and HLD.    PT Comments   Pt admitted with above diagnosis.  Pt currently with functional limitations due to the deficits listed below (see PT Problem List). Pt in bed when therapist arrived. Pt agreeable to therapy intervention, son present intermittently throughout. Pt continues to endorse head and L hip pain with bruising however imaging negative for acute findings and HgB trending up. Pt required max Ax 2 with use of hospital bed pad for supine to sit, pt seated EOB and offloading L  hip when sitting, pt required min A x 2 for sit to stand  from EOB and min A x 1 for SPT bed to [facility] with use of RW and cues, pt able to push to stand from [facility] to return to bed with pt able to amb 5 feet with RW, min A and cues, pt self limiting L LE in stance phase, pt returned to bed with min A x 2 and all needs in place with pt awaiting PTAR for transition to SNF for short term rehab.  Pt will benefit from acute skilled PT to increase their independence and safety with mobility to allow discharge.      If plan is discharge home, recommend the following: Assistance with cooking/housework;Assist for transportation;Help with stairs or ramp for entrance;Two people to help with walking and/or transfers;A lot of help with bathing/dressing/bathroom   Can travel by private vehicle         Equipment Recommendations  None recommended by PT    Recommendations for Other Services       Precautions / Restrictions Precautions Precautions: Fall Recall of Precautions/Restrictions: Impaired Restrictions Weight Bearing Restrictions Per Provider Order: No     Mobility  Bed Mobility Overal bed mobility: Needs Assistance Bed Mobility: Supine to Sit, Sit to Supine     Supine to sit: Mod assist, +2 for physical assistance, +2 for safety/equipment, HOB elevated (use of pad to facilitate hips EOB) Sit to supine: +2 for physical assistance, +2 for safety/equipment, Min assist (helicopter technique)   General bed mobility comments: increased time, multimodal cues, Pt attempts to assist, but therapist perform >75% of work for supine to sit, sit to supine pt required A for L LE due to pain and to flat surface    Transfers Overall transfer level: Needs assistance Equipment used: Rolling walker (2 wheels) Transfers: Sit to/from Stand, Bed to chair/wheelchair/BSC Sit to Stand: Min assist, +2 safety/equipment   Step pivot transfers: +2 safety/equipment, Contact guard assist       General transfer comment: min A for pull to stand from EOB x 2, once standing min A for stability and pt able to perform sit to stand from [facility] with CGA x 2    Ambulation/Gait Ambulation/Gait assistance: Min assist Gait Distance (Feet): 5 Feet Assistive device: Rolling walker (2 wheels) Gait Pattern/deviations: Narrow base of support, Trunk flexed,  Step-to pattern, Antalgic, Decreased stance time - left Gait velocity: decreased     General Gait Details: cues for safety, posture, proper distance from RW, with pt limited by L LE pain   Stairs             Wheelchair Mobility     Tilt Bed    Modified Rankin (Stroke Patients Only)       Balance Overall balance assessment: Needs assistance Sitting-balance support: Feet supported Sitting balance-Leahy Scale: Fair Sitting balance -  Comments: seated EOB and when on commode R lateral and posterior lean-- pt offloading L LE due to pain   Standing balance support: Bilateral upper extremity supported, During functional activity, Reliant on assistive device for balance Standing balance-Leahy Scale: Poor Standing balance comment: B UE support and CGA to min A                            Communication Communication Communication: No apparent difficulties Factors Affecting Communication:  (intermittent word finding)  Cognition Arousal: Alert Behavior During Therapy: WFL for tasks assessed/performed   PT - Cognitive impairments: Orientation, Memory, Safety/Judgement, Problem solving, Sequencing                       PT - Cognition Comments: pt presents with improved alertness and orientation as well as ability to express self and communicate needs Following commands: Intact      Cueing Cueing Techniques: Verbal cues, Gestural cues, Tactile cues, Visual cues  Exercises      General Comments        Pertinent Vitals/Pain Pain Assessment Pain Assessment: Faces Faces Pain Scale: Hurts even more Pain Location: L hip/head Pain Descriptors / Indicators: Aching, Constant, Discomfort, Grimacing, Guarding, Sore, Tender, Tightness Pain Intervention(s): Limited activity within patient's tolerance, Monitored during session, Repositioned    Home Living                          Prior Function            PT Goals (current goals can now be found in the care plan section) Acute Rehab PT Goals Patient Stated Goal: go home Progress towards PT goals: Progressing toward goals (slow secondary to LLE pain)    Frequency    Min 3X/week      PT Plan      Co-evaluation PT/OT/SLP Co-Evaluation/Treatment: Yes Reason for Co-Treatment: Complexity of the patient's impairments (multi-system involvement);Necessary to address cognition/behavior during functional activity;For patient/therapist safety  (recently unable to get OOB due to hospital fall) PT goals addressed during session: Mobility/safety with mobility;Balance;Proper use of DME;Strengthening/ROM OT goals addressed during session: ADL's and self-care;Proper use of Adaptive equipment and DME;Strengthening/ROM      AM-PAC PT 6 Clicks Mobility   Outcome Measure  Help needed turning from your back to your side while in a flat bed without using bedrails?: A Little Help needed moving from lying on your back to sitting on the side of a flat bed without using bedrails?: A Lot Help needed moving to and from a bed to a chair (including a wheelchair)?: A Lot Help needed standing up from a chair using your arms (e.g., wheelchair or bedside chair)?: A Lot Help needed to walk in hospital room?: A Lot Help needed climbing 3-5 steps with a railing? : Total 6 Click Score: 12    End of Session Equipment Utilized During Treatment: Gait belt  Activity Tolerance: Patient limited by fatigue;Patient limited by pain Patient left: with call bell/phone within reach;with nursing/sitter in room;in bed Nurse Communication: Mobility status PT Visit Diagnosis: Unsteadiness on feet (R26.81);Difficulty in walking, not elsewhere classified (R26.2);Repeated falls (R29.6);Muscle weakness (generalized) (M62.81);Other abnormalities of gait and mobility (R26.89)     Time: 8882-8856 PT Time Calculation (min) (ACUTE ONLY): 26 min  Charges:    $Therapeutic Activity: 8-22 mins PT General Charges $$ ACUTE PT VISIT: 1 Visit                     [place], PT Acute Rehab    [place] VEAR Drone 04/12/2024, 12:55 PM

## 2024-04-12 NOTE — Plan of Care (Signed)

## 2024-04-12 NOTE — Care Management Important Message (Signed)
 Important Message  Patient Details IM Letter given. Name: [name] MRN: [id number] Date of Birth: Jul 20, 1929   Important Message Given:  Yes - Medicare IM     Melba Ates 04/12/2024, 12:18 PM

## 2024-04-12 NOTE — TOC Transition Note (Signed)
 Transition of Care [facility]) - Discharge Note   Patient Details  Name: [name] MRN: [id number] Date of Birth: [birth year]  [facility] CM/SW Contact:  Alfonse JONELLE Rex, RN Phone Number: 04/12/2024, 11:03 AM   Clinical Narrative:   DC to SNF- [facility] and Rehab, family at bedside, PTAR for transport. No further INPT CM needs identified at this time.     Final next level of care: Skilled Nursing Facility Barriers to Discharge: Barriers Resolved   Patient Goals and CMS Choice Patient states their goals for this hospitalization and ongoing recovery are:: return home CMS Medicare.gov Compare Post Acute Care list provided to:: Patient Represenative (must comment) Vivia Sharper  Son, Emergency Contact  [phone] (Mobile)) Choice offered to / list presented to : Adult Children [place] ownership interest in [facility].provided to:: Adult Children    Discharge Placement              Patient chooses bed at: [facility] Patient to be transferred to facility by: PTAR Name of family member notified: [place], [place], Emergency Contact  [phone] (Mobile) Patient and family notified of of transfer: 04/12/24  Discharge Plan and Services Additional resources added to the After Visit Summary for       Post Acute Care Choice: Skilled Nursing Facility                               Social Drivers of Health (SDOH) Interventions SDOH Screenings   Food Insecurity: No Food Insecurity (04/05/2024)  Housing: Low Risk (04/05/2024)  Transportation Needs: Patient Unable To Answer (04/05/2024)  Utilities: Patient Unable To Answer (04/05/2024)  Depression (PHQ2-9): Low Risk (09/22/2022)  Financial Resource Strain: Low Risk (12/29/2023)   Received from [facility] [employer] [facility]  Social Connections: Moderately Integrated (04/05/2024)  Tobacco Use: Low Risk (04/04/2024)     Readmission Risk Interventions    04/08/2024   10:37 AM 11/03/2022     1:11 PM  Readmission Risk Prevention Plan  Post Dischage Appt  Complete  Medication Screening  Complete  Transportation Screening Complete Complete  HRI or Home Care Consult Complete   Social Work Consult for Recovery Care Planning/Counseling Complete   Palliative Care Screening Complete   Medication Review Oceanographer) Complete

## 2024-04-12 NOTE — Discharge Summary (Signed)
 " Physician Discharge Summary   [name] FMW:[id number] DOB: 26-May-1929 DOA: 04/04/2024  PCP: [name], NP  Admit date: 04/04/2024 Discharge date: 04/12/2024  Admitted From: Home Disposition:  SNF Discharging physician: Alm Apo, MD Barriers to discharge: none  Recommendations at discharge: Repeat CBC in ~1 week to ensure Hgb stable Monitor left hip hematoma for resolution  Continue palliative care    Discharge Condition: stable CODE STATUS: DNR Diet recommendation:  Diet Orders (From admission, onward)     Start     Ordered   04/12/24 0000  Diet Carb Modified        04/12/24 1022   04/04/24 2020  Diet Carb Modified Room service appropriate? Yes  Diet effective now       Question Answer Comment  Diet-HS Snack? Nothing   Calorie Level Medium 1600-2000   Fluid consistency: Thin   Room service appropriate? Yes      04/04/24 2027            Hospital Course: [name] is a 89 year old female with likely previously undiagnosed dementia, DMII, neuropathy, chronic back pain, vitamin D  deficiency, HTN, HLD, CVA. She was admitted with generalized weakness, confusion, visual hallucinations, poor intake, falls at home. Creatinine on admission 3.6, BUN 85. She was started on IV fluids and admitted for further workup.     Assessment & Plan:    ABLA Normocytic anemia Fall -Patient had fall out of bed on night of 04/08/2024.  Sustained right forehead hematoma and developed large bruising on left hip - Hgb had precipitous drop today, confirmed on labs - CT head, left hip, A/P performed for further assessment - Large left hip subcutaneous hematoma measuring 12.4 x 7.8 x 5.1 cm superficial to the left gluteus maximus and left IT band; discussed findings with son bedside and patient.  CT head showing right frontal scalp hematoma with no underlying fracture - Patient amenable with blood transfusion.   - Hgb stable and improving post PRBC  Acute metabolic  encephalopathy Hallucinations Possible previously undiagnosed dementia Failure to thrive - probable combo of uremia from dehydration/poor intake with background of stroke history, frailty, and dementia  -MRI brain did not reveal any acute findings - hx CVA 03/18/24 with ongoing decline since - Chest x-ray was nonrevealing. - Urine culture was not significant.   - family reported long-term memory loss, visual hallucinations, falls and failure to thrive - mentation has improved with IVF and improvement in renal function  - Continue nightly Seroquel  - Continue encouraging good intake - PT/OT evaluations, plan is for SNF - Eventually needs at least palliative care when going home from rehab  AKI on CKD 3B -Likely multifactorial - Baseline CKD 3B - Poor p.o. intake is reported - Recent UTI - Recent Bactrim  use. -Creatinine 3.6 on admission.  Has steadily improved with fluids   Diabetes mellitus type 2 with hyperglycemia: A1c of 7.2% - Continue diet control   Mild elevated troponin -Likely type II elevation - Advanced chronic kidney disease at baseline, with acute kidney injury is also noted. -No chest pain or shortness of breath reported.    Mild leukocytosis - Chronic and intermittent - No constitutional symptoms endorsed - Negative cultures   History CVA - MRI brain 03/18/2024 with acute infarct involving left parietal lobe.  Moderate chronic microvascular changes and remote infarcts noted right frontal periventricular white matter, right thalamus with associated chronic microhemorrhage, and left cerebellum - completed DAPT course, continue monotherapy asa   Generalized weakness, deconditioning, falls,  ambulatory dysfunction: - History of falling; probable contribution from prior left cerebellar stroke as well as deconditioning and dehydration; overall failure to thrive -PT/OT evals, awaiting SNF placement   Goals of care - Patient is DNR. - Palliative care consult to  define goals of care -Home with palliative care after rehab    Principal Diagnosis: Acute renal failure superimposed on stage 3b chronic kidney disease [facility])  Discharge Diagnoses: Active Hospital Problems   Diagnosis Date Noted   Acute renal failure superimposed on stage 3b chronic kidney disease (HCC) 11/02/2022    Priority: 1.   Acute metabolic encephalopathy 03/18/2024    Priority: 2.   ABLA (acute blood loss anemia) 04/11/2024    Priority: 3.   Hematoma of left hip 04/11/2024    Priority: 3.   Type 2 diabetes mellitus with complication, with long-term current use of insulin  (HCC) 02/09/2017    Priority: 3.   Hypertension 07/29/2012    Priority: 5.   Fall 04/11/2024   History of CVA (cerebrovascular accident) 04/09/2024   Malnutrition of moderate degree 04/08/2024   Goals of care, counseling/discussion 05/18/2015    Resolved Hospital Problems  No resolved problems to display.     Discharge Instructions     Diet Carb Modified   Complete by: As directed    Increase activity slowly   Complete by: As directed       Allergies as of 04/12/2024       Reactions   Ciprofloxacin  Other (See Comments)   Dizziness   Morphine And Codeine Other (See Comments)   Fuzzy feeling        Medication List     STOP taking these medications    clopidogrel  75 MG tablet Commonly known as: PLAVIX    metFORMIN  1000 MG tablet Commonly known as: GLUCOPHAGE    sulfamethoxazole -trimethoprim  800-160 MG tablet Commonly known as: BACTRIM  DS       TAKE these medications    acetaminophen  325 MG tablet Commonly known as: TYLENOL  Take 325-650 mg by mouth every 6 (six) hours as needed (for pain or headaches).   aspirin  EC 81 MG tablet Take 1 tablet (81 mg total) by mouth daily. Swallow whole.   Basaglar  KwikPen 100 UNIT/ML Inject 7 Units into the skin at bedtime as needed (for a BGL of 200 or greater). What changed: how much to take   bisoprolol  5 MG tablet Commonly known  as: ZEBETA  Take 2.5 mg by mouth in the morning.   CENTRUM SILVER WOMEN 50+ PO Take 1 tablet by mouth daily with breakfast.   cephALEXin  250 MG capsule Commonly known as: KEFLEX  Take 250 mg by mouth daily with breakfast.   CRANBERRY PO Take 1 tablet by mouth See admin instructions. Cranberry 500 mg + bacillus sporogenes + calcium  + Vitamin C = Take 1 caplet by mouth at bedtime   D-MANNOSE PO Take 700 mg by mouth at bedtime.   Melatonin 5 MG Chew Chew 5 mg by mouth at bedtime.   QUEtiapine  25 MG tablet Commonly known as: SEROQUEL  Take 0.5 tablets (12.5 mg total) by mouth at bedtime.   rosuvastatin  20 MG tablet Commonly known as: CRESTOR  Take 10 mg by mouth in the morning.   traMADol  50 MG tablet Commonly known as: ULTRAM  Take 1 tablet (50 mg total) by mouth every 12 (twelve) hours as needed. What changed:  when to take this reasons to take this        Contact information for after-discharge care     Destination  [facility] [place] .   Service: Skilled Nursing Contact information: [address] [place]  [phone]                    Allergies[1]  Consultations: Palliative care   Procedures:   Discharge Exam: BP (!) 161/77 (BP Location: Right Arm)   Pulse 86   Temp 97.8 F (36.6 C) (Oral)   Resp 14   Ht 5' 2 (1.575 m)   Wt 51.1 kg   SpO2 100%   BMI 20.60 kg/m  Physical Exam Constitutional:      Comments: Pleasantly confused resting in bed in no distress  HENT:     Head: Normocephalic.     Comments: Right forehead hematoma noted    Mouth/Throat:     Mouth: Mucous membranes are moist.  Eyes:     Extraocular Movements: Extraocular movements intact.  Cardiovascular:     Rate and Rhythm: Normal rate and regular rhythm.  Pulmonary:     Effort: Pulmonary effort is normal. No respiratory distress.     Breath sounds: Normal breath sounds. No wheezing.  Abdominal:     General: Bowel  sounds are normal. There is no distension.     Palpations: Abdomen is soft.     Tenderness: There is no abdominal tenderness.  Musculoskeletal:     Cervical back: Normal range of motion and neck supple.     Right lower leg: No edema.     Left lower leg: No edema.     Comments: Left hip bruising noted; stable tenderness and no spreading of bruising  Skin:    General: Skin is warm and dry.  Neurological:     Comments: Follows commands and moves all 4 extremities equally.  No obvious focal deficits  Psychiatric:        Mood and Affect: Mood normal.      The results of significant diagnostics from this hospitalization (including imaging, microbiology, ancillary and laboratory) are listed below for reference.   Microbiology: No results found for this or any previous visit (from the past 240 hours).   Labs: BNP (last 3 results) No results for input(s): BNP in the last 8760 hours. Basic Metabolic Panel: Recent Labs  Lab 04/06/24 0459 04/07/24 0513 04/08/24 0410 04/10/24 0505 04/10/24 0803 04/11/24 1017 04/12/24 0503  NA 141 139 137 135 136 134* 136  K 3.5 3.2* 3.6 4.2 4.6 4.0 4.0  CL 108 105 105 103 104 104 103  CO2 19* 16* 19* 20* 17* 19* 23  GLUCOSE 179* 211* 276* 489* 466* 333* 297*  BUN 48* 40* 36* 52* 52* 43* 36*  CREATININE 2.30* 1.93* 2.05* 2.31* 2.27* 1.75* 1.84*  CALCIUM  8.1* 8.3* 8.1* 8.4* 8.5* 8.8* 8.7*  MG  --  1.4*  --  1.9  --  1.9 2.1  PHOS 2.2* 3.5 2.8  --   --   --   --    Liver Function Tests: Recent Labs  Lab 04/06/24 0459 04/07/24 0513 04/08/24 0410  ALBUMIN 3.4* 3.5 3.2*   No results for input(s): LIPASE, AMYLASE in the last 168 hours. No results for input(s): AMMONIA in the last 168 hours. CBC: Recent Labs  Lab 04/07/24 0513 04/10/24 0505 04/10/24 1001 04/11/24 1017 04/12/24 0503  WBC 13.5* 15.9* 17.1* 14.9* 16.4*  NEUTROABS 9.9* 10.9* 11.8* 9.9* 8.5*  HGB 11.1* 6.4* 7.1* 8.8*  8.8* 8.9*  HCT 31.7* 18.7* 20.8* 25.6*  25.8*  26.5*  MCV 92.2 94.4  94.5 94.5 95.0  PLT 252 198 237 235 305   Cardiac Enzymes: No results for input(s): CKTOTAL, CKMB, CKMBINDEX, TROPONINI in the last 168 hours. BNP: Invalid input(s): POCBNP CBG: Recent Labs  Lab 04/11/24 0749 04/11/24 1148 04/11/24 1657 04/11/24 2117 04/12/24 0736  GLUCAP 245* 329* 315* 355* 308*   D-Dimer No results for input(s): DDIMER in the last 72 hours. Hgb A1c No results for input(s): HGBA1C in the last 72 hours. Lipid Profile No results for input(s): CHOL, HDL, LDLCALC, TRIG, CHOLHDL, LDLDIRECT in the last 72 hours. Thyroid  function studies No results for input(s): TSH, T4TOTAL, T3FREE, THYROIDAB in the last 72 hours.  Invalid input(s): FREET3 Anemia work up No results for input(s): VITAMINB12, FOLATE, FERRITIN, TIBC, IRON, RETICCTPCT in the last 72 hours. Urinalysis    Component Value Date/Time   COLORURINE YELLOW 04/04/2024 1605   APPEARANCEUR HAZY (A) 04/04/2024 1605   LABSPEC 1.014 04/04/2024 1605   PHURINE 5.0 04/04/2024 1605   GLUCOSEU 150 (A) 04/04/2024 1605   HGBUR SMALL (A) 04/04/2024 1605   BILIRUBINUR NEGATIVE 04/04/2024 1605   KETONESUR NEGATIVE 04/04/2024 1605   PROTEINUR 100 (A) 04/04/2024 1605   UROBILINOGEN 0.2 07/29/2012 1237   NITRITE NEGATIVE 04/04/2024 1605   LEUKOCYTESUR NEGATIVE 04/04/2024 1605   Sepsis Labs Recent Labs  Lab 04/10/24 0505 04/10/24 1001 04/11/24 1017 04/12/24 0503  WBC 15.9* 17.1* 14.9* 16.4*   Microbiology No results found for this or any previous visit (from the past 240 hours).  Procedures/Studies: CT HEAD WO CONTRAST ( ) Result Date: 04/10/2024 EXAM: CT HEAD WITHOUT CONTRAST 04/10/2024 01:30:35 PM TECHNIQUE: CT of the head was performed without the administration of intravenous contrast. Automated exposure control, iterative reconstruction, and/or weight based adjustment of the mA/kV was utilized to reduce the radiation dose to as low as  reasonably achievable. COMPARISON: CT head without contrast 03/29/2024. CLINICAL HISTORY: Right forehead hematoma status post fall. Concern for bleed with a 4 g Hgb drop overnight. FINDINGS: BRAIN AND VENTRICLES: No acute hemorrhage. No evidence of acute infarct. No hydrocephalus. No extra-axial collection. No mass effect or midline shift. Moderate atrophy and white matter changes are stable. Atherosclerotic calcifications are present in the cavernous carotid arteries bilaterally and at the dural margin of both vertebral arteries. No hyperdense vessel is present. ORBITS: Bilateral lens replacements are noted. The globes and orbits are otherwise within normal limits. SINUSES: No acute abnormality. SOFT TISSUES AND SKULL: Right frontal scalp hematoma is present without underlying fracture or foreign body. No skull fracture. IMPRESSION: 1. No acute intracranial abnormality. 2. Right frontal scalp hematoma without underlying fracture or foreign body. Electronically signed by: Lonni Necessary MD 04/10/2024 02:18 PM EST RP Workstation: [id number]   CT HIP LEFT WO CONTRAST Result Date: 04/10/2024 EXAM: CT OF THE LEFT HIP WITHOUT IV CONTRAST 04/10/2024 01:30:35 PM TECHNIQUE: CT of the left hip was performed without the administration of intravenous contrast. Multiplanar reformatted images are provided for review. Automated exposure control, iterative reconstruction, and/or weight based adjustment of the mA/kV was utilized to reduce the radiation dose to as low as reasonably achievable. COMPARISON: None available. CLINICAL HISTORY: R forehead hematoma status post fall; Left hip large bruising; concern for bleed; 4 g Hgb drop overnight. FINDINGS: BONES: No acute fracture or dislocation. No aggressive appearing osseous abnormality or periostitis. SOFT TISSUE: There is an approximately 260 cc hematoma posterolaterally overlying the left gluteus maximus muscle and tracking down superficial to the left iliotibial band  overlying the greater trochanter, with mixed internal density and internal  hematoma right level. No significant intramuscular component. Atherosclerosis noted. JOINT: No significant degenerative changes. No osseous erosions. No hip effusion observed. INTRAPELVIC CONTENTS: Uterus absent. Limited images of the intrapelvic contents are unremarkable. IMPRESSION: 1. Approximately 260 cc subcutaneous hematoma posterolateral to the left gluteus maximus muscle, extending distally superficial to the left iliotibial band over the greater trochanter, with mixed internal density compatible with a hematocrit level, and no significant intramuscular component. 2. No acute osseous abnormality. 3. Chronic and incidental findings include atherosclerosis and prior hysterectomy. Electronically signed by: Ryan Salvage MD 04/10/2024 02:16 PM EST RP Workstation: [id number]   CT ABDOMEN PELVIS WO CONTRAST Result Date: 04/10/2024 EXAM: CT ABDOMEN AND PELVIS WITHOUT CONTRAST 04/10/2024 01:30:35 PM TECHNIQUE: CT of the abdomen and pelvis was performed without the administration of intravenous contrast. Multiplanar reformatted images are provided for review. Automated exposure control, iterative reconstruction, and/or weight-based adjustment of the mA/kV was utilized to reduce the radiation dose to as low as reasonably achievable. COMPARISON: 01/24/2023 CLINICAL HISTORY: Fall with dropping hemoglobin level. FINDINGS: LOWER CHEST: No acute abnormality. HEART AND MEDIASTINUM: Mitral valve calcification. Coronary and aortic atherosclerosis. LUNGS: Airway thickening suggest bronchitis or reactive airways disease. LIVER: The liver is unremarkable. GALLBLADDER AND BILE DUCTS: Mildly contracted gallbladder, trace stranding below the gallbladder and also adjacent to the descending duodenum could be a secondary indicator of duodenitis or cholecystitis. No biliary ductal dilatation. SPLEEN: No acute abnormality. PANCREAS: No acute abnormality.  ADRENAL GLANDS: No acute abnormality. KIDNEYS, URETERS AND BLADDER: 0.3 cm fluid density lesion of the left kidney upper pole favoring benign cyst. No further imaging workup of this lesion is indicated. Vascular calcifications in the left renal hilum. No urinary tract calculi identified. No hydronephrosis. No perinephric or periureteral stranding. Urinary bladder is unremarkable. GI AND BOWEL: Stomach demonstrates no acute abnormality. Trace stranding adjacent to the descending duodenum could be a secondary indicator of duodenitis or cholecystitis. There is no bowel obstruction. PERITONEUM AND RETROPERITONEUM: No ascites. No free air. VASCULATURE: Systemic atherosclerosis is present, including the aorta and iliac arteries. LYMPH NODES: No lymphadenopathy. REPRODUCTIVE ORGANS: Uterus absent. BONES AND SOFT TISSUES: Moderate degenerative hip arthropathy bilaterally. RED CAGES BILATERALLY AT THE L4-L5 AND L5-S1 LEVELS. Lumbar spondylosis and degenerative disc disease. Suspected moderate left foraminal stenosis at L3-L4 due to facet and intervertebral spurring. Mild superior endplate concavity at T12 is chronic compatible with a remote compression fracture. Trace edema anterior to the coccyx without coccygeal fracture identified. 12.4 x 7.8 x 5.1 cm (volume = 260 cm\S\3) subcutaneous hematoma superficial to the left gluteus maximus and left iliotibial band/greater trochanter, with mixed density elements and internal hematocrit level. Surrounding subcutaneous edema noted. No significant intramuscular component. IMPRESSION: 1. Subcutaneous hematoma (12.4 x 7.8 x 5.1 cm, volume = 260 cm\S\3) superficial to the left gluteus maximus and left iliotibial band/greater trochanter, with mixed density elements and an internal hematocrit level, and surrounding subcutaneous edema, without significant intramuscular component. 2. Trace edema anterior to the coccyx without visible coccygeal fracture. 3. Mildly contracted gallbladder  with trace stranding inferior to the gallbladder and adjacent to the descending duodenum, which can be seen with cholecystitis or duodenitis. 4. Moderate degenerative hip arthropathy bilaterally, lumbar spondylosis and degenerative disc disease with cages at L4-5 and L5-S1, suspected moderate left foraminal stenosis at L3-4, and chronic mild superior endplate concavity at T12 compatible with a remote compression fracture. 5. Additional chronic and incidental findings include mitral valve calcification, coronary and systemic atherosclerosis, airway thickening, vascular calcifications in the left renal hilum, and  absent uterus. Electronically signed by: Ryan Salvage MD 04/10/2024 02:07 PM EST RP Workstation: [id number]   MR BRAIN WO CONTRAST Result Date: 04/04/2024 EXAM: MRI BRAIN WITHOUT CONTRAST 04/04/2024 05:58:25 PM TECHNIQUE: Multiplanar multisequence MRI of the head/brain was performed without the administration of intravenous contrast. COMPARISON: CT head 03/29/2024 and MRI head 03/18/2024. CLINICAL HISTORY: Neuro deficit, acute, stroke suspected. FINDINGS: BRAIN AND VENTRICLES: No acute infarct. No intracranial hemorrhage. No mass. No midline shift. No hydrocephalus. The sella is unremarkable. Normal flow voids. Mild parenchymal volume loss. Focal encephalomalacia in the right frontal periventricular white matter with surrounding gliosis suggestive of remote infarct. Additional remote lacunar infarct in the left paramedian brainstem at the junction of the midbrain and pons. Similar appearance of chronic microvascular ischemic changes. Remote lacunar infarct in the right thalamus. Chronic microhemorrhages in the right thalamus and left external capsule. Small remote infarcts in the left cerebellum. ORBITS: Bilateral lens replacement. SINUSES AND MASTOIDS: No acute abnormality. BONES AND SOFT TISSUES: Normal marrow signal. No acute soft tissue abnormality. IMPRESSION: 1. No acute intracranial  abnormality. 2. Similar appearance of remote infarcts, chronic microvascular ischemic changes, and mild parenchymal volume loss. Electronically signed by: Donnice Mania MD 04/04/2024 07:43 PM EST RP Workstation: [id number]   DG Chest Port 1 View Result Date: 04/04/2024 CLINICAL DATA:  Altered mental status. EXAM: PORTABLE CHEST 1 VIEW COMPARISON:  Chest radiograph dated 03/18/2024 FINDINGS: No focal consolidation, pleural effusion or pneumothorax. The cardiac silhouette is within normal limits. Atherosclerotic calcification of the aorta. No acute osseous pathology. IMPRESSION: No active disease. Electronically Signed   By: Vanetta Chou M.D.   On: 04/04/2024 17:16   CT Head Wo Contrast Result Date: 03/29/2024 EXAM: CT HEAD WITHOUT CONTRAST 03/29/2024 06:20:27 PM TECHNIQUE: CT of the head was performed without the administration of intravenous contrast. Automated exposure control, iterative reconstruction, and/or weight based adjustment of the mA/kV was utilized to reduce the radiation dose to as low as reasonably achievable. COMPARISON: CT head without contrast 03/18/2024. CLINICAL HISTORY: Mental status change, unknown cause. FINDINGS: BRAIN AND VENTRICLES: No acute hemorrhage. No evidence of acute infarct. No hydrocephalus. No extra-axial collection. No mass effect or midline shift. Mild atrophy and advanced white matter changes are stable. Remote lacunar infarct present within the left paramedian lower midbrain. Atherosclerotic calcifications are present in the cavernous carotid arteries bilaterally and at the dural margin of both vertebral arteries. No hyperdense vessel is present. ORBITS: No acute abnormality. SINUSES: No acute abnormality. SOFT TISSUES AND SKULL: No acute soft tissue abnormality. No skull fracture. IMPRESSION: 1. No acute intracranial abnormality. 2. Stable mild atrophy and advanced white matter changes. 3. Remote lacunar infarct in the left paramedian lower midbrain. 4.  Atherosclerotic calcifications in the cavernous carotid and vertebral arteries. Electronically signed by: Lonni Necessary MD 03/29/2024 06:33 PM EST RP Workstation: [id number]   MR BRAIN WO CONTRAST Result Date: 03/18/2024 EXAM: MRI BRAIN WITHOUT CONTRAST 03/18/2024 06:36:00 PM TECHNIQUE: Multiplanar multisequence MRI of the head/brain was performed without the administration of intravenous contrast. COMPARISON: Same day CT head and MRI head 12/10/2021. CLINICAL HISTORY: Mental status change, unknown cause. FINDINGS: BRAIN AND VENTRICLES: There is a 2 mm focus of diffusion signal abnormality in the left parietal lobe. There are prominent areas of T2 and FLAIR hyperintensity throughout the periventricular and subcortical white matter suggestive of moderate chronic microvascular ischemic changes. 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 Additional remote lacunar infarct in the right thalamus with associated area of chronic microhemorrhage. Additional signal abnormality in  the pons related to chronic microvascular ischemic changes. Chronic microhemorrhage in the left external capsule. Small remote infarcts in the left cerebellum which are similar to prior bilateral lens replacements. There is mild age related volume loss. No mass. No midline shift. No hydrocephalus. The sella is unremarkable. Normal flow voids. ORBITS: No acute abnormality. SINUSES AND MASTOIDS: No acute abnormality. BONES AND SOFT TISSUES: Normal marrow signal. No acute soft tissue abnormality. IMPRESSION: 1. 2 mm focus of acute infarct in the left parietal lobe. 2. Moderate chronic microvascular ischemic changes. 3. Remote infarcts in the right frontal periventricular white matter, right thalamus with associated chronic microhemorrhage, and left cerebellum, similar to prior. Electronically signed by: Donnice Mania MD 03/18/2024 07:43 PM EST RP Workstation: [id number]   DG Chest Portable 1 View Result Date:  03/18/2024 CLINICAL DATA:  Confusion EXAM: PORTABLE CHEST 1 VIEW COMPARISON:  10/12/2023 FINDINGS: The heart size and mediastinal contours are within normal limits. Both lungs are clear. The visualized skeletal structures are unremarkable. IMPRESSION: No active disease. Electronically Signed   By: Ozell Daring M.D.   On: 03/18/2024 16:53   CT Head Wo Contrast Result Date: 03/18/2024 CLINICAL DATA:  Mental status change, altered mental status. EXAM: CT HEAD WITHOUT CONTRAST TECHNIQUE: Contiguous axial images were obtained from the base of the skull through the vertex without intravenous contrast. RADIATION DOSE REDUCTION: This exam was performed according to the departmental dose-optimization program which includes automated exposure control, adjustment of the mA and/or kV according to patient size and/or use of iterative reconstruction technique. COMPARISON:  MRI head 12/10/2021. FINDINGS: Brain: No acute intracranial hemorrhage. Remote infarct in the right frontal periventricular white matter similar appearance of moderate chronic microvascular ischemic changes. No edema, mass effect, or midline shift. The basilar cisterns are patent. Ventricles: Prominence of the ventricles suggesting underlying parenchymal volume loss. Vascular: Atherosclerosis of the carotid siphons and intracranial vertebral arteries. Skull: No acute or aggressive finding. Orbits: Bilateral lens replacement. Sinuses: The visualized paranasal sinuses are clear. Other: Mastoid air cells are clear. IMPRESSION: No CT evidence of acute intracranial abnormality. Moderate chronic microvascular ischemic changes. 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 Electronically Signed   By: Donnice Mania M.D.   On: 03/18/2024 16:41     Time coordinating discharge: Over 30 minutes    Alm Apo, MD  [facility] 04/12/2024, 10:24 AM    [1]  Allergies Allergen Reactions   Ciprofloxacin  Other (See Comments)     Dizziness    Morphine And Codeine Other (See Comments)    Fuzzy feeling   "

## 2024-04-12 NOTE — Progress Notes (Signed)
 Attempted to call report to facility X 2, was transferred and then no one picked up. Pt d/c via [facility] w all belongings in stable condition

## 2024-04-30 ENCOUNTER — Ambulatory Visit: Attending: Cardiology

## 2024-04-30 DIAGNOSIS — I639 Cerebral infarction, unspecified: Secondary | ICD-10-CM

## 2024-05-10 ENCOUNTER — Ambulatory Visit (HOSPITAL_BASED_OUTPATIENT_CLINIC_OR_DEPARTMENT_OTHER): Admitting: Cardiology

## 2024-05-13 ENCOUNTER — Ambulatory Visit (HOSPITAL_BASED_OUTPATIENT_CLINIC_OR_DEPARTMENT_OTHER): Payer: Self-pay | Admitting: Cardiology

## 2024-05-13 DIAGNOSIS — I639 Cerebral infarction, unspecified: Secondary | ICD-10-CM | POA: Diagnosis not present

## 2024-05-25 ENCOUNTER — Encounter (HOSPITAL_COMMUNITY): Payer: Self-pay

## 2024-05-25 ENCOUNTER — Inpatient Hospital Stay (HOSPITAL_COMMUNITY)
Admission: EM | Admit: 2024-05-25 | Disposition: A | Discharge status: Skilled Nursing Facility | Source: Skilled Nursing Facility | Attending: Internal Medicine | Admitting: Internal Medicine

## 2024-05-25 ENCOUNTER — Other Ambulatory Visit: Payer: Self-pay

## 2024-05-25 ENCOUNTER — Emergency Department (HOSPITAL_COMMUNITY)

## 2024-05-25 DIAGNOSIS — I639 Cerebral infarction, unspecified: Principal | ICD-10-CM

## 2024-05-25 DIAGNOSIS — G8384 Todd's paralysis (postepileptic): Secondary | ICD-10-CM | POA: Diagnosis not present

## 2024-05-25 DIAGNOSIS — G4089 Other seizures: Secondary | ICD-10-CM | POA: Diagnosis not present

## 2024-05-25 DIAGNOSIS — E1100 Type 2 diabetes mellitus with hyperosmolarity without nonketotic hyperglycemic-hyperosmolar coma (NKHHC): Principal | ICD-10-CM | POA: Diagnosis present

## 2024-05-25 DIAGNOSIS — E118 Type 2 diabetes mellitus with unspecified complications: Secondary | ICD-10-CM

## 2024-05-25 DIAGNOSIS — E1165 Type 2 diabetes mellitus with hyperglycemia: Secondary | ICD-10-CM | POA: Diagnosis not present

## 2024-05-25 DIAGNOSIS — G9341 Metabolic encephalopathy: Secondary | ICD-10-CM | POA: Diagnosis present

## 2024-05-25 DIAGNOSIS — G9389 Other specified disorders of brain: Secondary | ICD-10-CM | POA: Diagnosis not present

## 2024-05-25 LAB — I-STAT CHEM 8, ED
BUN: 30 mg/dL — ABNORMAL HIGH (ref 8–23)
Calcium, Ion: 1.14 mmol/L — ABNORMAL LOW (ref 1.15–1.40)
Chloride: 96 mmol/L — ABNORMAL LOW (ref 98–111)
Creatinine, Ser: 1.6 mg/dL — ABNORMAL HIGH (ref 0.44–1.00)
Glucose, Bld: >700 mg/dL (ref 70–99)
HCT: 35 % — ABNORMAL LOW (ref 36.0–46.0)
Hemoglobin: 11.9 g/dL — ABNORMAL LOW (ref 12.0–15.0)
Potassium: 4.2 mmol/L (ref 3.5–5.1)
Sodium: 136 mmol/L (ref 135–145)
TCO2: 27 mmol/L (ref 22–32)

## 2024-05-25 LAB — PROTIME-INR
INR: 1 (ref 0.8–1.2)
Prothrombin Time: 13.3 s (ref 11.4–15.2)

## 2024-05-25 LAB — I-STAT VENOUS BLOOD GAS, ED
Acid-Base Excess: 3 mmol/L — ABNORMAL HIGH (ref 0.0–2.0)
Bicarbonate: 27.7 mmol/L (ref 20.0–28.0)
Calcium, Ion: 1.11 mmol/L — ABNORMAL LOW (ref 1.15–1.40)
HCT: 31 % — ABNORMAL LOW (ref 36.0–46.0)
Hemoglobin: 10.5 g/dL — ABNORMAL LOW (ref 12.0–15.0)
O2 Saturation: 52 %
Potassium: 3.5 mmol/L (ref 3.5–5.1)
Sodium: 136 mmol/L (ref 135–145)
TCO2: 29 mmol/L (ref 22–32)
pCO2, Ven: 44.7 mmHg (ref 44–60)
pH, Ven: 7.401 (ref 7.25–7.43)
pO2, Ven: 28 mmHg — CL (ref 32–45)

## 2024-05-25 LAB — CBC
HCT: 33 % — ABNORMAL LOW (ref 36.0–46.0)
Hemoglobin: 11.3 g/dL — ABNORMAL LOW (ref 12.0–15.0)
MCH: 33.5 pg (ref 26.0–34.0)
MCHC: 34.2 g/dL (ref 30.0–36.0)
MCV: 97.9 fL (ref 80.0–100.0)
Platelets: 265 10*3/uL (ref 150–400)
RBC: 3.37 MIL/uL — ABNORMAL LOW (ref 3.87–5.11)
RDW: 12.4 % (ref 11.5–15.5)
WBC: 9.4 10*3/uL (ref 4.0–10.5)
nRBC: 0 % (ref 0.0–0.2)

## 2024-05-25 LAB — COMPREHENSIVE METABOLIC PANEL WITH GFR
ALT: 22 U/L (ref 0–44)
AST: 38 U/L (ref 15–41)
Albumin: 3.8 g/dL (ref 3.5–5.0)
Alkaline Phosphatase: 150 U/L — ABNORMAL HIGH (ref 38–126)
Anion gap: 13 (ref 5–15)
BUN: 28 mg/dL — ABNORMAL HIGH (ref 8–23)
CO2: 23 mmol/L (ref 22–32)
Calcium: 9.3 mg/dL (ref 8.9–10.3)
Chloride: 95 mmol/L — ABNORMAL LOW (ref 98–111)
Creatinine, Ser: 1.75 mg/dL — ABNORMAL HIGH (ref 0.44–1.00)
GFR, Estimated: 27 mL/min — ABNORMAL LOW
Glucose, Bld: 683 mg/dL (ref 70–99)
Potassium: 4.1 mmol/L (ref 3.5–5.1)
Sodium: 132 mmol/L — ABNORMAL LOW (ref 135–145)
Total Bilirubin: 0.3 mg/dL (ref 0.0–1.2)
Total Protein: 7.1 g/dL (ref 6.5–8.1)

## 2024-05-25 LAB — CBG MONITORING, ED
Glucose-Capillary: 541 mg/dL (ref 70–99)
Glucose-Capillary: >600 mg/dL (ref 70–99)

## 2024-05-25 LAB — DIFFERENTIAL
Abs Immature Granulocytes: 0.03 10*3/uL (ref 0.00–0.07)
Basophils Absolute: 0.1 10*3/uL (ref 0.0–0.1)
Basophils Relative: 1 %
Eosinophils Absolute: 0.1 10*3/uL (ref 0.0–0.5)
Eosinophils Relative: 1 %
Immature Granulocytes: 0 %
Lymphocytes Relative: 26 %
Lymphs Abs: 2.5 10*3/uL (ref 0.7–4.0)
Monocytes Absolute: 0.9 10*3/uL (ref 0.1–1.0)
Monocytes Relative: 10 %
Neutro Abs: 5.8 10*3/uL (ref 1.7–7.7)
Neutrophils Relative %: 62 %

## 2024-05-25 LAB — APTT: aPTT: 24 s (ref 24–36)

## 2024-05-25 LAB — BETA-HYDROXYBUTYRIC ACID: Beta-Hydroxybutyric Acid: 0.14 mmol/L (ref 0.05–0.27)

## 2024-05-25 LAB — ETHANOL: Alcohol, Ethyl (B): <15 mg/dL

## 2024-05-25 LAB — OSMOLALITY: Osmolality: 337 mosm/kg (ref 275–295)

## 2024-05-25 MED ORDER — LACTATED RINGERS IV SOLN: INTRAVENOUS | Status: DC

## 2024-05-25 MED ORDER — POTASSIUM CHLORIDE 10 MEQ/100ML IV SOLN
10.0000 meq | INTRAVENOUS | Status: AC
  Administered 2024-05-25 – 2024-05-26 (×2): 10 meq via INTRAVENOUS
  Filled 2024-05-25 (×2): qty 100

## 2024-05-25 MED ORDER — IOHEXOL 350 MG/ML SOLN: 75.0000 mL | Freq: Once | INTRAVENOUS | Status: DC | PRN

## 2024-05-25 MED ORDER — INSULIN REGULAR(HUMAN) IN NACL 100-0.9 UT/100ML-% IV SOLN
INTRAVENOUS | Status: DC
  Administered 2024-05-25: 6 [IU]/h via INTRAVENOUS
  Filled 2024-05-25: qty 100

## 2024-05-25 MED ORDER — IOHEXOL 350 MG/ML SOLN
75.0000 mL | Freq: Once | INTRAVENOUS | Status: AC | PRN
  Administered 2024-05-25: 75 mL via INTRAVENOUS

## 2024-05-25 MED ORDER — LEVETIRACETAM (KEPPRA) 500 MG/5 ML ADULT IV PUSH
60.0000 mg/kg | Freq: Once | INTRAVENOUS | Status: AC
  Administered 2024-05-25: 3000 mg via INTRAVENOUS

## 2024-05-25 MED ORDER — DEXTROSE 50 % IV SOLN: 0.0000 mL | INTRAVENOUS | Status: AC | PRN

## 2024-05-25 MED ORDER — DEXTROSE IN LACTATED RINGERS 5 % IV SOLN: INTRAVENOUS | Status: DC

## 2024-05-25 MED ORDER — LACTATED RINGERS IV BOLUS
20.0000 mL/kg | Freq: Once | INTRAVENOUS | Status: AC
  Administered 2024-05-25: 1000 mL via INTRAVENOUS

## 2024-05-25 NOTE — ED Provider Notes (Signed)
 " [place] [facility] DE[facility] AT [facility] Provider Note   CSN: [id number] Arrival date & time: 05/25/24  2215     Patient presents with: No chief complaint on file.   [name] is a 89 y.o. female    Presenting from a nursing facility with right-sided weakness.  Lasting well at 5 PM.  Patient is slow to speech.  History of diabetes       Prior to Admission medications  Medication Sig Start Date End Date Taking? Authorizing Provider  acetaminophen  (TYLENOL ) 325 MG tablet Take 325-650 mg by mouth every 6 (six) hours as needed (for pain or headaches).    [provider]  aspirin  EC 81 MG tablet Take 1 tablet (81 mg total) by mouth daily. Swallow whole. 04/12/24 04/12/25  Patsy Lenis, MD  bisoprolol  (ZEBETA ) 5 MG tablet Take 2.5 mg by mouth in the morning.    [provider]  cephALEXin  (KEFLEX ) 250 MG capsule Take 250 mg by mouth daily with breakfast.    [provider]  CRANBERRY PO Take 1 tablet by mouth See admin instructions. Cranberry 500 mg + bacillus sporogenes + calcium  + Vitamin C = Take 1 caplet by mouth at bedtime    [provider]  D-MANNOSE PO Take 700 mg by mouth at bedtime.    [provider]  Insulin  Glargine (BASAGLAR  KWIKPEN) 100 UNIT/ML Inject 7 Units into the skin at bedtime as needed (for a BGL of 200 or greater). 04/12/24   Patsy Lenis, MD  Melatonin 5 MG CHEW Chew 5 mg by mouth at bedtime.    [provider]  Multiple Vitamins-Minerals (CENTRUM SILVER WOMEN 50+ PO) Take 1 tablet by mouth daily with breakfast.    [provider]  QUEtiapine  (SEROQUEL ) 25 MG tablet Take 0.5 tablets (12.5 mg total) by mouth at bedtime. 04/12/24   Patsy Lenis, MD  rosuvastatin  (CRESTOR ) 20 MG tablet Take 10 mg by mouth in the morning.    [provider]  traMADol  (ULTRAM ) 50 MG tablet Take 1 tablet (50 mg total) by mouth every 12 (twelve) hours as needed. 04/12/24   Patsy Lenis, MD     Allergies: Ciprofloxacin  and Morphine and codeine    Review of Systems  Updated Vital Signs Wt 49 kg   BMI 19.76 kg/m   Physical Exam Constitutional:      General: She is not in acute distress. HENT:     Head: Normocephalic and atraumatic.  Eyes:     Conjunctiva/sclera: Conjunctivae normal.     Pupils: Pupils are equal, round, and reactive to light.  Cardiovascular:     Rate and Rhythm: Normal rate and regular rhythm.  Pulmonary:     Effort: Pulmonary effort is normal. No respiratory distress.  Abdominal:     General: There is no distension.     Tenderness: There is no abdominal tenderness.  Skin:    General: Skin is warm and dry.  Neurological:     Mental Status: She is alert.     Comments: Right arm weakness more pronounced in right leg weakness, slow speech     (all labs ordered are listed, but only abnormal results are displayed) Labs Reviewed  CBC - Abnormal; Notable for the following components:      Result Value   RBC 3.37 (*)    Hemoglobin 11.3 (*)    HCT 33.0 (*)    All other components within normal limits  I-STAT CHEM 8, ED - Abnormal;  Notable for the following components:   Chloride 96 (*)    BUN 30 (*)    Creatinine, Ser 1.60 (*)    Glucose, Bld >700 (*)    Calcium , Ion 1.14 (*)    Hemoglobin 11.9 (*)    HCT 35.0 (*)    All other components within normal limits  CBG MONITORING, ED - Abnormal; Notable for the following components:   Glucose-Capillary >600 (*)    All other components within normal limits  PROTIME-INR  APTT  DIFFERENTIAL  COMPREHENSIVE METABOLIC PANEL WITH GFR  ETHANOL  URINE DRUG SCREEN  OSMOLALITY    EKG: None  Radiology: CT HEAD CODE STROKE WO CONTRAST (LKW 0-4.5h, LVO 0-24h) Result Date: 05/25/2024 CLINICAL DATA:  Code stroke. Initial evaluation for acute neuro deficit, stroke suspected. EXAM: CT HEAD WITHOUT CONTRAST TECHNIQUE: Contiguous axial images were obtained from the base of the skull through the vertex  without intravenous contrast. RADIATION DOSE REDUCTION: This exam was performed according to the departmental dose-optimization program which includes automated exposure control, adjustment of the mA and/or kV according to patient size and/or use of iterative reconstruction technique. COMPARISON:  Prior study from 04/10/2024. FINDINGS: Brain: Generalized age-related cerebral atrophy with moderate chronic microvascular ischemic disease. No acute intracranial hemorrhage. No acute large vessel territory infarct. No mass lesion or midline shift. Diffuse ventricular prominence related global parenchymal volume loss without hydrocephalus. No extra-axial fluid collection. Vascular: No abnormal hyperdense vessel. Calcified atherosclerosis present at the skull base. Skull: Scalp soft tissues within normal limits.  Calvarium intact. Sinuses/Orbits: Globes and orbital soft tissues demonstrate no acute finding. Visualized paranasal sinuses are clear. No mastoid effusion. Other: None. ASPECTS [facility] Stroke Program Early CT Score) - Ganglionic level infarction (caudate, lentiform nuclei, internal capsule, insula, M1-M3 cortex): 7 - Supraganglionic infarction (M4-M6 cortex): 3 Total score (0-10 with 10 being normal): 10 IMPRESSION: 1. No acute intracranial abnormality. 2. ASPECTS is 10. 3. Age-related cerebral atrophy with moderate chronic microvascular ischemic disease. These results were communicated to Dr. Vanessa at 10:33 pm on 05/25/2024 by text page via the [facility] messaging system. Electronically Signed   By: Morene Hoard M.D.   On: 05/25/2024 22:35     Procedures   Medications Ordered in the ED  levETIRAcetam  (KEPPRA ) undiluted injection 3,000 mg (3,000 mg Intravenous Given 05/25/24 2245)  lactated ringers  bolus 980 mL (has no administration in time range)  insulin  regular, human (MYXREDLIN ) 100 units/ 100 mL infusion (has no administration in time range)  lactated ringers  infusion (has no administration  in time range)  dextrose  5 % in lactated ringers  infusion (has no administration in time range)  dextrose  50 % solution 0-50 mL (has no administration in time range)  potassium chloride  10 mEq in 100 mL IVPB (has no administration in time range)  iohexol  (OMNIPAQUE ) 350 MG/ML injection 75 mL (75 mLs Intravenous Contrast Given 05/25/24 2237)    Clinical Course as of 05/25/24 2312  Sat May 25, 2024  2247 Neurologist Dr Vanessa reports no LVO, he believes that this may be a potential stroke in the setting of metabolic derangement and hyperglycemia.  He has ordered Keppra  and request medical admission for hyperglycemia management.  Additional labs ordered and pending regarding potential HHS. [MT]  2311 Signed out to Dr Wendie pending labs, temp, admission [MT]    Clinical Course User Index [MT] Zyniah Ferraiolo, Donnice PARAS, MD  Medical Decision Making Amount and/or Complexity of Data Reviewed Labs: ordered. Radiology: ordered.  Risk Prescription drug management.   Patient shows a right sided weakness, some confusion noted.  Differential include metabolic derangement versus CVA versus other.  Arrives as a code stroke, neurology evaluated the patient, CT imaging with no LVO.  Please see ED course.  Patient is noted to have significant hyperglycemia.  She is pending initial labs for DKA or HHS evaluation.  Venous gas fortunately does not show acidosis.  She is started on IV fluids, insulin , will need admission following her labs.  Supplemental history by EMS     Final diagnoses:  None    ED Discharge Orders     None          Cottie Donnice PARAS, MD 05/25/24 2312  "

## 2024-05-25 NOTE — ED Notes (Signed)
 Assumed care of patient.

## 2024-05-25 NOTE — Consult Note (Signed)
 NEUROLOGY CONSULT NOTE   Date of service: May 25, 2024 Patient Name: [name] MRN:  [id number] DOB:  Oct 15, 1929 Chief Complaint: R sided weakness, L gaze deviation, R facial droop, hyperglycemic Requesting Provider: No att. providers found  History of Present Illness  [name] is a 89 y.o. female with hx of DM2 with neuropathy, HLD, HTN, Age related macular degeneration, who presents from her facility with R sided weakness and aphasia with L gaze deviation. LKW of 5pm, staff saw her shaking at some point and that was concerning for a seizure. Found her to have left gaze deviation and R sided weakness. Her POC glucose is reading high.  Patient is mute and aphasic on arrival and unable to provide any history. POC glucose on eval here continues to be high.  Apparently was taken off medications for diabetes and switched to diet control recently.  LKW: 1700 Modified rankin score: 4-Needs assistance to walk and tend to bodily needs IV Thrombolysis: not offered, outside window  NIHSS components Score: Comment  1a Level of Conscious 0[x]  1[]  2[]  3[]      1b LOC Questions 0[]  1[]  2[x]       1c LOC Commands 0[]  1[]  2[x]       2 Best Gaze 0[]  1[]  2[x]       3 Visual 0[]  1[]  2[x]  3[]      4 Facial Palsy 0[]  1[]  2[x]  3[]      5a Motor Arm - left 0[x]  1[]  2[]  3[]  4[]  UN[]    5b Motor Arm - Right 0[]  1[]  2[x]  3[]  4[]  UN[]    6a Motor Leg - Left 0[x]  1[]  2[]  3[]  4[]  UN[]    6b Motor Leg - Right 0[]  1[]  2[x]  3[]  4[]  UN[]    7 Limb Ataxia 0[x]  1[]  2[]  UN[]      8 Sensory 0[]  1[]  2[x]  UN[]      9 Best Language 0[]  1[]  2[]  3[x]      10 Dysarthria 0[]  1[]  2[x]  UN[]      11 Extinct. and Inattention 0[x]  1[]  2[]       TOTAL:       ROS  Unable to ascertain due to aphasia, mute.  Past History   Past Medical History:  Diagnosis Date   Arthritis    Chronic back pain    Cystoid macular edema of right eye 08/19/2019   Diabetic neuropathy (HCC)    Exudative age-related macular degeneration of  left eye with inactive choroidal neovascularization (HCC) 11/18/2019   Hypercholesteremia    Hypertension    Liver lesion    Neuropathy    Type II or unspecified type diabetes mellitus without mention of complication, not stated as uncontrolled    Vitamin D  deficiency    Wears glasses     Past Surgical History:  Procedure Laterality Date   ABDOMINAL HYSTERECTOMY     APPENDECTOMY     BACK SURGERY  1993   lumb lam-fusion   BREAST EXCISIONAL BIOPSY Right 1967   benign   CESAREAN SECTION     x 3    COLONOSCOPY     EYE SURGERY     both cataracts   TRIGGER FINGER RELEASE  01/10/2012   Procedure: RELEASE TRIGGER FINGER/A-1 PULLEY;  Surgeon: Shyenne KANDICE Herald, MD;  Location:  SURGERY CENTER;  Service: Orthopedics;  Laterality: Right;  right ring trigger release    Family History: Family History  Problem Relation Age of Onset   Diabetes Sister    Liver disease Sister    Liver cancer Sister  Lymphoma Sister    Stroke Brother    Diabetes Brother    Kidney cancer Brother    Colon cancer Neg Hx     Social History  reports that she has never smoked. She has never used smokeless tobacco. She reports that she does not drink alcohol and does not use drugs.  Allergies[1]  Medications  Current Medications[2]  Vitals   Vitals:   05/25/24 2200  Weight: 49 kg    Body mass index is 19.76 kg/m.   Physical Exam   General: Laying comfortably in bed; in no acute distress.  HENT: Normal oropharynx and mucosa. Normal external appearance of ears and nose.  Neck: Supple, no pain or tenderness  CV: No JVD. No peripheral edema.  Pulmonary: Symmetric Chest rise. Normal respiratory effort.  Abdomen: Soft to touch, non-tender.  Ext: No cyanosis, edema, or deformity  Skin: No rash. Normal palpation of skin.   Musculoskeletal: Normal digits and nails by inspection. No clubbing.   Neurologic Examination  Mental status/Cognition: Alert, eyes open. Does not answer any  orientation questions. Speech/language: limited speech output, non sensical speech and dysarthric. Does not seem to follow commands. Cranial nerves:   CN II Pupils equal and reactive to light, R hemianopsia   CN III,IV,VI L gaze deviation, does not cross midline   CN V normal sensation in V1, V2, and V3 segments bilaterally   CN VII R facial droop   CN VIII Makes eye contact to speech   CN IX & X normal palatal elevation, no uvular deviation    CN XI 5/5 head turn and 5/5 shoulder shrug bilaterally   CN XII midline tongue protrusion   Motor:  Muscle bulk: poor, tone normal Holds LUE off the bed for more than 10 secs without any drift. Some antigravity movement in RUE Hold LLE off the bed for more than 5 secs without any drift. Some antigravity movement noted in RLE  Sensation:  Light touch Diminshed response to proximal pinch on the right side.   Pin prick    Temperature    Vibration   Proprioception    Coordination/Complex Motor:  Unable to get her to do.   Labs/Imaging/Neurodiagnostic studies   CBC: No results for input(s): WBC, NEUTROABS, HGB, HCT, MCV, PLT in the last 168 hours. Basic Metabolic Panel:  Lab Results  Component Value Date   NA 136 04/12/2024   K 4.0 04/12/2024   CO2 23 04/12/2024   GLUCOSE 297 (H) 04/12/2024   BUN 36 (H) 04/12/2024   CREATININE 1.84 (H) 04/12/2024   CALCIUM  8.7 (L) 04/12/2024   GFRNONAA 25 (L) 04/12/2024   GFRAA 41 (L) 05/21/2020   Lipid Panel:  Lab Results  Component Value Date   LDLCALC 34 02/21/2023   HgbA1c:  Lab Results  Component Value Date   HGBA1C 7.2 (H) 04/04/2024   Urine Drug Screen:     Component Value Date/Time   LABOPIA NEGATIVE 03/29/2024 1835   COCAINSCRNUR NEGATIVE 03/29/2024 1835   LABBENZ NEGATIVE 03/29/2024 1835   AMPHETMU NEGATIVE 03/29/2024 1835   THCU NEGATIVE 03/29/2024 1835   LABBARB NEGATIVE 03/29/2024 1835    Alcohol Level     Component Value Date/Time   ETH <15 03/29/2024  1741   INR  Lab Results  Component Value Date   INR 1.0 10/12/2023   APTT  Lab Results  Component Value Date   APTT 31 11/16/2011   AED levels: No results found for: PHENYTOIN, ZONISAMIDE, LAMOTRIGINE, LEVETIRACETA  CT Head  without contrast(Personally reviewed): CTH was negative for a large hypodensity concerning for a large territory infarct or hyperdensity concerning for an ICH  CT angio Head and Neck with contrast(Personally reviewed): No LVO.  MRI Brain(Personally reviewed): pending  Neurodiagnostics cEEG:  pending  ASSESSMENT   [name] is a 89 y.o. female with hx of DM2 with neuropathy, HLD, HTN, Age related macular degeneration, who presents from her facility with some seizure like activity, followed by R sided weakness and aphasia with L gaze deviation. CT Head negative, CTA head and neck negative. She is outside tnkase window and not deemed to be a candidate for thrombectomy due to no LVO and poor functional baseline.  Symptoms point to L hemispheric dysfunction. I suspect that the likely etiology of her presentation is focal seizures secondary to hyperglycemia and now post ictal todds.  RECOMMENDATIONS  - cEEG in AM - Keppra  60mg /Kg IV once, followed by Keppra  500mg  BID starting in AM - treatment of hyperglycemia per primary team. ______________________________________________________________________  Plan discussed with Dr. Cottie with the ED team.  Signed, Bryona Foxworthy, MD [facility]     [1]  Allergies Allergen Reactions   Ciprofloxacin  Other (See Comments)    Dizziness    Morphine And Codeine Other (See Comments)    Fuzzy feeling  [2] No current facility-administered medications for this encounter.  Current Outpatient Medications:    acetaminophen  (TYLENOL ) 325 MG tablet, Take 325-650 mg by mouth every 6 (six) hours as needed (for pain or headaches)., Disp: , Rfl:    aspirin  EC 81 MG tablet, Take 1 tablet (81 mg  total) by mouth daily. Swallow whole., Disp: , Rfl:    bisoprolol  (ZEBETA ) 5 MG tablet, Take 2.5 mg by mouth in the morning., Disp: , Rfl:    cephALEXin  (KEFLEX ) 250 MG capsule, Take 250 mg by mouth daily with breakfast., Disp: , Rfl:    CRANBERRY PO, Take 1 tablet by mouth See admin instructions. Cranberry 500 mg + bacillus sporogenes + calcium  + Vitamin C = Take 1 caplet by mouth at bedtime, Disp: , Rfl:    D-MANNOSE PO, Take 700 mg by mouth at bedtime., Disp: , Rfl:    Insulin  Glargine (BASAGLAR  KWIKPEN) 100 UNIT/ML, Inject 7 Units into the skin at bedtime as needed (for a BGL of 200 or greater)., Disp: , Rfl:    Melatonin 5 MG CHEW, Chew 5 mg by mouth at bedtime., Disp: , Rfl:    Multiple Vitamins-Minerals (CENTRUM SILVER WOMEN 50+ PO), Take 1 tablet by mouth daily with breakfast., Disp: , Rfl:    QUEtiapine  (SEROQUEL ) 25 MG tablet, Take 0.5 tablets (12.5 mg total) by mouth at bedtime., Disp: , Rfl:    rosuvastatin  (CRESTOR ) 20 MG tablet, Take 10 mg by mouth in the morning., Disp: , Rfl:    traMADol  (ULTRAM ) 50 MG tablet, Take 1 tablet (50 mg total) by mouth every 12 (twelve) hours as needed., Disp: 10 tablet, Rfl: 0

## 2024-05-25 NOTE — ED Triage Notes (Signed)
 Patient BIB [facility] from [facility] for concerns of stroke or seizure like activity. Patient LKW 1700, then 1800 staff noticed shaking with L gaze preference, R side deficits and no movement. EMS reports CBG read high on their CBG. Patient has no hx of seizures. BP 198/80 HR 57 98% RA R 20 CBG >600

## 2024-05-25 NOTE — Code Documentation (Signed)
 Stroke Response Nurse Documentation Code Documentation  [name] is a 89 y.o. female arriving to [facility]  via [facility] on 1/31 with past medical hx of  DM2 with neuropathy, HLD, HTN, macular degeneration . On No antithrombotic. Code stroke was activated by EMS.   Patient from SNF where she was LKW at 1700 and now complaining of left gaze preference and right sided weakness.   Stroke team at the bedside on patient arrival. Labs drawn and patient cleared for CT by Dr. Cottie. Patient to CT with team. NIHSS 19, see documentation for details and code stroke times. Patient with disoriented, not following commands, left gaze preference , right hemianopia, right facial droop, right arm weakness, right leg weakness, right decreased sensation, Global aphasia , and dysarthria  on exam. The following imaging was completed:  CT Head and CTA. Patient is not a candidate for IV Thrombolytic due to outside of window. Patient {ACTION; IS/IS [id number]} a candidate for IR due to ***.   Care Plan: ***.    Bedside handoff with ED RN ***.    Griselda Alm ORN  Rapid Response RN

## 2024-05-26 ENCOUNTER — Inpatient Hospital Stay (HOSPITAL_COMMUNITY)

## 2024-05-26 DIAGNOSIS — E118 Type 2 diabetes mellitus with unspecified complications: Secondary | ICD-10-CM

## 2024-05-26 DIAGNOSIS — E1165 Type 2 diabetes mellitus with hyperglycemia: Secondary | ICD-10-CM | POA: Diagnosis not present

## 2024-05-26 DIAGNOSIS — E1100 Type 2 diabetes mellitus with hyperosmolarity without nonketotic hyperglycemic-hyperosmolar coma (NKHHC): Principal | ICD-10-CM | POA: Diagnosis present

## 2024-05-26 DIAGNOSIS — G928 Other toxic encephalopathy: Secondary | ICD-10-CM | POA: Diagnosis not present

## 2024-05-26 DIAGNOSIS — R569 Unspecified convulsions: Secondary | ICD-10-CM | POA: Diagnosis not present

## 2024-05-26 DIAGNOSIS — G9341 Metabolic encephalopathy: Secondary | ICD-10-CM | POA: Diagnosis not present

## 2024-05-26 DIAGNOSIS — Z794 Long term (current) use of insulin: Secondary | ICD-10-CM | POA: Diagnosis not present

## 2024-05-26 LAB — RESPIRATORY PANEL BY PCR

## 2024-05-26 LAB — I-STAT CG4 LACTIC ACID, ED
Lactic Acid, Venous: 1.3 mmol/L (ref 0.5–1.9)
Lactic Acid, Venous: 1.6 mmol/L (ref 0.5–1.9)

## 2024-05-26 LAB — URINALYSIS, COMPLETE (UACMP) WITH MICROSCOPIC
Bacteria, UA: NONE SEEN
Bilirubin Urine: NEGATIVE
Glucose, UA: >=500 mg/dL — AB
Hgb urine dipstick: NEGATIVE
Ketones, ur: NEGATIVE mg/dL
Leukocytes,Ua: NEGATIVE
Nitrite: NEGATIVE
Protein, ur: 100 mg/dL — AB
Specific Gravity, Urine: 1.027 (ref 1.005–1.030)
pH: 6 (ref 5.0–8.0)

## 2024-05-26 LAB — GLUCOSE, CAPILLARY
Glucose-Capillary: 192 mg/dL — ABNORMAL HIGH (ref 70–99)
Glucose-Capillary: 220 mg/dL — ABNORMAL HIGH (ref 70–99)
Glucose-Capillary: 228 mg/dL — ABNORMAL HIGH (ref 70–99)
Glucose-Capillary: 275 mg/dL — ABNORMAL HIGH (ref 70–99)
Glucose-Capillary: 356 mg/dL — ABNORMAL HIGH (ref 70–99)

## 2024-05-26 LAB — I-STAT VENOUS BLOOD GAS, ED
Acid-Base Excess: 3 mmol/L — ABNORMAL HIGH (ref 0.0–2.0)
Bicarbonate: 26.6 mmol/L (ref 20.0–28.0)
Calcium, Ion: 1.2 mmol/L (ref 1.15–1.40)
HCT: 32 % — ABNORMAL LOW (ref 36.0–46.0)
Hemoglobin: 10.9 g/dL — ABNORMAL LOW (ref 12.0–15.0)
O2 Saturation: 70 %
Potassium: 3.4 mmol/L — ABNORMAL LOW (ref 3.5–5.1)
Sodium: 141 mmol/L (ref 135–145)
TCO2: 28 mmol/L (ref 22–32)
pCO2, Ven: 36.6 mmHg — ABNORMAL LOW (ref 44–60)
pH, Ven: 7.47 — ABNORMAL HIGH (ref 7.25–7.43)
pO2, Ven: 34 mmHg (ref 32–45)

## 2024-05-26 LAB — CBG MONITORING, ED
Glucose-Capillary: 140 mg/dL — ABNORMAL HIGH (ref 70–99)
Glucose-Capillary: 182 mg/dL — ABNORMAL HIGH (ref 70–99)
Glucose-Capillary: 183 mg/dL — ABNORMAL HIGH (ref 70–99)
Glucose-Capillary: 197 mg/dL — ABNORMAL HIGH (ref 70–99)
Glucose-Capillary: 248 mg/dL — ABNORMAL HIGH (ref 70–99)
Glucose-Capillary: 370 mg/dL — ABNORMAL HIGH (ref 70–99)
Glucose-Capillary: 517 mg/dL (ref 70–99)

## 2024-05-26 LAB — BLOOD GAS, VENOUS
Acid-Base Excess: 5.6 mmol/L — ABNORMAL HIGH (ref 0.0–2.0)
Bicarbonate: 29.8 mmol/L — ABNORMAL HIGH (ref 20.0–28.0)
O2 Saturation: 53.8 %
Patient temperature: 36.7
pCO2, Ven: 40 mmHg — ABNORMAL LOW (ref 44–60)
pH, Ven: 7.48 — ABNORMAL HIGH (ref 7.25–7.43)
pO2, Ven: <31 mmHg — CL (ref 32–45)

## 2024-05-26 LAB — CBC
HCT: 34.2 % — ABNORMAL LOW (ref 36.0–46.0)
Hemoglobin: 11.5 g/dL — ABNORMAL LOW (ref 12.0–15.0)
MCH: 32.8 pg (ref 26.0–34.0)
MCHC: 33.6 g/dL (ref 30.0–36.0)
MCV: 97.4 fL (ref 80.0–100.0)
Platelets: 224 10*3/uL (ref 150–400)
RBC: 3.51 MIL/uL — ABNORMAL LOW (ref 3.87–5.11)
RDW: 12.3 % (ref 11.5–15.5)
WBC: 11.5 10*3/uL — ABNORMAL HIGH (ref 4.0–10.5)
nRBC: 0 % (ref 0.0–0.2)

## 2024-05-26 LAB — BASIC METABOLIC PANEL WITH GFR
Anion gap: 15 (ref 5–15)
Anion gap: 13 (ref 5–15)
BUN: 25 mg/dL — ABNORMAL HIGH (ref 8–23)
BUN: 22 mg/dL (ref 8–23)
CO2: 24 mmol/L (ref 22–32)
CO2: 24 mmol/L (ref 22–32)
Calcium: 9.4 mg/dL (ref 8.9–10.3)
Calcium: 9.5 mg/dL (ref 8.9–10.3)
Chloride: 100 mmol/L (ref 98–111)
Chloride: 103 mmol/L (ref 98–111)
Creatinine, Ser: 1.83 mg/dL — ABNORMAL HIGH (ref 0.44–1.00)
Creatinine, Ser: 1.45 mg/dL — ABNORMAL HIGH (ref 0.44–1.00)
GFR, Estimated: 25 mL/min — ABNORMAL LOW
GFR, Estimated: 34 mL/min — ABNORMAL LOW
Glucose, Bld: 412 mg/dL — ABNORMAL HIGH (ref 70–99)
Glucose, Bld: 182 mg/dL — ABNORMAL HIGH (ref 70–99)
Potassium: 4.1 mmol/L (ref 3.5–5.1)
Potassium: 3.4 mmol/L — ABNORMAL LOW (ref 3.5–5.1)
Sodium: 139 mmol/L (ref 135–145)
Sodium: 140 mmol/L (ref 135–145)

## 2024-05-26 LAB — RESP PANEL BY RT-PCR (RSV, FLU A&B, COVID)  RVPGX2
Influenza A by PCR: NEGATIVE
Influenza B by PCR: NEGATIVE
Resp Syncytial Virus by PCR: NEGATIVE
SARS Coronavirus 2 by RT PCR: NEGATIVE

## 2024-05-26 LAB — CK: Total CK: 222 U/L (ref 38–234)

## 2024-05-26 LAB — VITAMIN B12: Vitamin B-12: 686 pg/mL (ref 180–914)

## 2024-05-26 LAB — AMMONIA: Ammonia: 19 umol/L (ref 9–35)

## 2024-05-26 LAB — LACTIC ACID, PLASMA: Lactic Acid, Venous: 2.9 mmol/L (ref 0.5–1.9)

## 2024-05-26 LAB — OSMOLALITY
Osmolality: 326 mosm/kg (ref 275–295)
Osmolality: 310 mosm/kg — ABNORMAL HIGH (ref 275–295)

## 2024-05-26 LAB — TSH: TSH: 0.379 u[IU]/mL (ref 0.350–4.500)

## 2024-05-26 MED ORDER — PIPERACILLIN-TAZOBACTAM IN DEX 2-0.25 GM/50ML IV SOLN
2.2500 g | Freq: Three times a day (TID) | INTRAVENOUS | Status: DC
  Administered 2024-05-26 – 2024-05-27 (×4): 2.25 g via INTRAVENOUS
  Filled 2024-05-26 (×6): qty 50

## 2024-05-26 MED ORDER — SODIUM CHLORIDE 0.9 % IV SOLN: INTRAVENOUS | Status: AC

## 2024-05-26 MED ORDER — POTASSIUM CHLORIDE 10 MEQ/100ML IV SOLN: 10.0000 meq | INTRAVENOUS | Status: DC

## 2024-05-26 MED ORDER — LEVETIRACETAM (KEPPRA) 500 MG/5 ML ADULT IV PUSH
500.0000 mg | Freq: Two times a day (BID) | INTRAVENOUS | Status: DC
  Administered 2024-05-26 – 2024-05-29 (×7): 500 mg via INTRAVENOUS
  Filled 2024-05-26 (×8): qty 5

## 2024-05-26 MED ORDER — ASPIRIN 300 MG RE SUPP
300.0000 mg | Freq: Every day | RECTAL | Status: DC
  Administered 2024-05-26 – 2024-05-28 (×3): 300 mg via RECTAL
  Filled 2024-05-26 (×4): qty 1

## 2024-05-26 MED ORDER — ASPIRIN 325 MG PO TABS
325.0000 mg | ORAL_TABLET | Freq: Every day | ORAL | Status: DC
  Administered 2024-05-29: 325 mg via ORAL
  Filled 2024-05-26 (×2): qty 1

## 2024-05-26 MED ORDER — STROKE: EARLY STAGES OF RECOVERY BOOK
Freq: Once | Status: AC
  Filled 2024-05-26: qty 1

## 2024-05-26 MED ORDER — LACTATED RINGERS IV BOLUS: 20.0000 mL/kg | Freq: Once | INTRAVENOUS | Status: DC

## 2024-05-26 MED ORDER — INSULIN ASPART 100 UNIT/ML IJ SOLN
0.0000 [IU] | INTRAMUSCULAR | Status: DC
  Administered 2024-05-26: 3 [IU] via SUBCUTANEOUS
  Administered 2024-05-26 – 2024-05-27 (×2): 5 [IU] via SUBCUTANEOUS
  Administered 2024-05-27: 3 [IU] via SUBCUTANEOUS
  Administered 2024-05-27 (×2): 5 [IU] via SUBCUTANEOUS
  Administered 2024-05-27: 2 [IU] via SUBCUTANEOUS
  Filled 2024-05-26 (×7): qty 1

## 2024-05-26 MED ORDER — INSULIN GLARGINE-YFGN 100 UNIT/ML ~~LOC~~ SOLN
6.0000 [IU] | SUBCUTANEOUS | Status: DC
  Administered 2024-05-26 – 2024-05-27 (×2): 6 [IU] via SUBCUTANEOUS
  Filled 2024-05-26 (×2): qty 0.06

## 2024-05-26 MED ORDER — LINEZOLID 600 MG/300ML IV SOLN
600.0000 mg | Freq: Two times a day (BID) | INTRAVENOUS | Status: DC
  Administered 2024-05-26 – 2024-05-27 (×3): 600 mg via INTRAVENOUS
  Filled 2024-05-26 (×4): qty 300

## 2024-05-26 MED ORDER — LABETALOL HCL 5 MG/ML IV SOLN
10.0000 mg | INTRAVENOUS | Status: AC | PRN
  Administered 2024-05-26 – 2024-05-31 (×8): 10 mg via INTRAVENOUS
  Filled 2024-05-26 (×8): qty 4

## 2024-05-26 MED ORDER — ACETAMINOPHEN 10 MG/ML IV SOLN
1000.0000 mg | Freq: Once | INTRAVENOUS | Status: AC
  Administered 2024-05-26: 1000 mg via INTRAVENOUS
  Filled 2024-05-26: qty 100

## 2024-05-26 MED ORDER — INSULIN ASPART 100 UNIT/ML IJ SOLN
0.0000 [IU] | INTRAMUSCULAR | Status: DC
  Administered 2024-05-26: 5 [IU] via SUBCUTANEOUS
  Filled 2024-05-26: qty 1

## 2024-05-26 MED ORDER — LORAZEPAM 2 MG/ML IJ SOLN: 1.0000 mg | Freq: Once | INTRAMUSCULAR | Status: AC | PRN

## 2024-05-26 MED ORDER — ENOXAPARIN SODIUM 30 MG/0.3ML IJ SOSY
30.0000 mg | PREFILLED_SYRINGE | INTRAMUSCULAR | Status: AC
  Administered 2024-05-26 – 2024-05-31 (×6): 30 mg via SUBCUTANEOUS
  Filled 2024-05-26 (×7): qty 0.3

## 2024-05-26 MED ORDER — LACTATED RINGERS IV SOLN: INTRAVENOUS | Status: DC

## 2024-05-26 MED ORDER — POTASSIUM CHLORIDE 10 MEQ/100ML IV SOLN
10.0000 meq | INTRAVENOUS | Status: AC
  Administered 2024-05-26: 10 meq via INTRAVENOUS
  Filled 2024-05-26 (×2): qty 100

## 2024-05-26 MED ORDER — POTASSIUM CHLORIDE 10 MEQ/100ML IV SOLN
10.0000 meq | INTRAVENOUS | Status: AC
  Administered 2024-05-26 (×2): 10 meq via INTRAVENOUS
  Filled 2024-05-26 (×2): qty 100

## 2024-05-26 NOTE — Progress Notes (Signed)
 Date and time results received: 05/26/24 1550 (use smartphrase .now to insert current time)  Test: Lactic Acid Critical Value: 2.9  Name of Provider Notified: Kathrin, MD  Orders Received? Or Actions Taken?: Orders Received - See Orders for details

## 2024-05-26 NOTE — TOC Progression Note (Addendum)
 Transition of Care [facility]) - Progression Note    Patient Details  Name: [name] MRN: [id number] Date of Birth: [birth year]  [facility] CM/SW Contact  [name], [place] Phone Number: 05/26/2024, 12:14 PM  Clinical Narrative:     Update: spoke with patient's son, Ozell, - he confirmed patient is from [facility] ([address]    12: 14 pm -Per chart review patient is from a SNF- patient is not oriented- called patient's- no answer, per RN, no family at bedside.   TOC will continue to follow and assist with discharge planning.    [name], MSW, LCSW Clinical Social Worker                     Expected Discharge Plan and Services                                               Social Drivers of Health (SDOH) Interventions SDOH Screenings   Food Insecurity: No Food Insecurity (04/05/2024)  Housing: Low Risk (04/05/2024)  Transportation Needs: Patient Unable To Answer (04/05/2024)  Utilities: Patient Unable To Answer (04/05/2024)  Depression (PHQ2-9): Low Risk (09/22/2022)  Financial Resource Strain: Low Risk (12/29/2023)   Received from [facility]  Social Connections: Moderately Integrated (04/05/2024)  Tobacco Use: Low Risk (05/25/2024)    Readmission Risk Interventions    04/08/2024   10:37 AM 11/03/2022    1:11 PM  Readmission Risk Prevention Plan  Post Dischage Appt  Complete  Medication Screening  Complete  Transportation Screening Complete Complete  HRI or Home Care Consult Complete   Social Work Consult for Recovery Care Planning/Counseling Complete   Palliative Care Screening Complete   Medication Review Oceanographer) Complete

## 2024-05-26 NOTE — Progress Notes (Signed)
 " PROGRESS NOTE  [name] FMW:[id number] DOB: 07-15-1929   PCP: [name], NP  Patient is from: SNF  DOA: 05/25/2024 LOS: 0  Chief complaints Chief Complaint  Patient presents with   Code Stroke     Brief Narrative / Interim history: 89 year old F with PMH of cognitive impairment, CVA, DM-2, neuropathy, HTN, HLD, CKD-3B and osteoarthritis brought to ED from SNF due to stroke and seizure-like activity.  Reportedly had some shaking with left gaze and right-sided weakness.  She presented as code stroke which was ruled out by neurology.  She was admitted with hyperosmolar hyperglycemic state and seizure-like activity.  In ED, SBP elevated to 190s but improved quickly.  CBG > 600.  Serum osmolality 337.  BMP glucose 683.  Na 132.  Bicarb 23.  AG 13.  Hgb 11.3.  EtOH level undetectable.  CT head without acute finding.  VBG reassuring.  Patient was started on insulin  drip and IV fluid and admitted for HHS and acute metabolic encephalopathy.  Neurology consulted and MRI brain ordered.  Patient spiked fever to 102.1 F overnight.  Blood cultures ordered.   Subjective: Seen and examined earlier this morning.  Spiked fever to 102.1 F overnight.  Mild leukocytosis this morning.  Patient is very somnolent and moves arms to sternal rub.   Assessment and plan: Poorly controlled IDDM-2 with hyperosmolar hyperglycemic state and neuropathy:  Due to noncompliance?  A1c 7.2% in 03/2024.  Supposed to be on Lantus  8 units at bedtime. Serum osmolality elevated to 337.  Hyperglycemia improved.  Recent Labs  Lab 05/26/24 0450 05/26/24 0604 05/26/24 0849 05/26/24 1131 05/26/24 1134  GLUCAP 182* 197* 183* 356* 275*  -Increase SSI to moderate-every 4 hours while NPO -Continue Lantus  6 units daily -Continue IV fluid hydration- -monitor serum osmolality  Acute metabolic encephalopathy: Multifactorial including HHS, infection, delirium and possible seizure.  Reportedly had shaking and left gaze  when she presented to ED.  CT head and CT angio head and neck without acute finding.  Does not seem to have focal neurodeficit but limited exam due to encephalopathy.  VBG reassuring.  RVP, UA, TSH, ammonia and B12 unrevealing.  She had significant fever to 102.1 earlier this morning.  She also have uncontrolled hypertension. -Start broad-spectrum antibiotics pending cultures -Follow MRI brain, EEG and UDS. -Continue Keppra  per neurology. -Manage HHS as above. -Blood pressure control - N.p.o. pending SLP eval  Seizure-like activity: Reportedly had some shaking and left gaze on presentation.  - Follow MRI and EEG - Continue Keppra  per neurology  SIRS: Patient with significant fever and mild leukocytosis.  Unclear source of infection.  UA and CXR unrevealing.  Lactic acid negative. -Start broad-spectrum antibiotics  Uncontrolled hypertension: SBP elevated to 190s.  Currently NPO.  She is on Zebeta  at home. - Decrease IV fluid - IV labetalol  as needed while NPO  History of cognitive impairment: History of memory loss and a problem list. -See encephalopathy for management  History of CVA: CT head and CT angio head and neck without acute finding -Follow MRI brain.  Hypokalemia -Monitor replenish K and Mg as appropriate   Anemia of chronic disease: Hgb at baseline. -Continue monitoring  Hyperlipidemia - Continue statin when able to take p.o. safely.   Body mass index is 19.76 kg/m.           DVT prophylaxis:  enoxaparin  (LOVENOX ) injection 30 mg Start: 05/26/24 1000  Code Status: DNR Family Communication: None at bedside Level of care: Progressive Status is:  Inpatient Remains inpatient appropriate because: Glycemic crisis, encephalopathy, seizure-like activity and SIRS   Final disposition: Yet to be determined   55 minutes with more than 50% spent in reviewing records, counseling patient/family and coordinating care.  Consultants:   Neurology  Procedures: None  Microbiology summarized: COVID-19, influenza and RSV PCR nonreactive A-20 pathogen RVP nonreactive Blood culture pending  Objective: Vitals:   05/26/24 1030 05/26/24 1111 05/26/24 1125 05/26/24 1139  BP:  (!) 178/159 (!) 201/63 (!) 194/68  Pulse: 75  72   Resp: (!) 25 20 (!) 21 20  Temp:   97.9 F (36.6 C)   TempSrc:   Oral   SpO2: 100% 97% 98%   Weight:        Examination:  GENERAL: Appears frail.  No apparent distress. HEENT: MMM.  Vision and hearing grossly intact.  NECK: Supple.  No apparent JVD.  RESP:  No IWOB.  Fair aeration bilaterally. CVS:  RRR. Heart sounds normal.  ABD/GI/GU: BS+. Abd soft, NTND.  MSK/EXT:  Moves extremities.  Significant muscle mass and subcu fat loss.  Mittens bilaterally. SKIN: no apparent skin lesion or wound NEURO: Somnolent.  Moves arms response to sternal rub.  No facial asymmetry.  PERRL.  No apparent focal neuro deficit but limited exam. PSYCH: Somnolent.  Sch Meds:  Scheduled Meds:  enoxaparin  (LOVENOX ) injection  30 mg Subcutaneous Q24H   insulin  aspart  0-15 Units Subcutaneous Q4H   insulin  glargine-yfgn  6 Units Subcutaneous Q24H   levETIRAcetam   500 mg Intravenous BID   Continuous Infusions:  lactated ringers  75 mL/hr at 05/26/24 0802   linezolid  (ZYVOX ) IV 600 mg (05/26/24 0950)   piperacillin -tazobactam (ZOSYN )  IV Stopped (05/26/24 1019)   PRN Meds:.dextrose , labetalol , LORazepam   Antimicrobials: Anti-infectives (From admission, onward)    Start     Dose/Rate Route Frequency Ordered Stop   05/26/24 1000  linezolid  (ZYVOX ) IVPB 600 mg        600 mg 300 mL/hr over 60 Minutes Intravenous Every 12 hours 05/26/24 0737     05/26/24 0830  piperacillin -tazobactam (ZOSYN ) IVPB 2.25 g        2.25 g 100 mL/hr over 30 Minutes Intravenous Every 8 hours 05/26/24 0820          I have personally reviewed the following labs and images: CBC: Recent Labs  Lab 05/25/24 2221 05/25/24 2222  05/25/24 2301 05/26/24 0044 05/26/24 0816  WBC 9.4  --   --  11.5*  --   NEUTROABS 5.8  --   --   --   --   HGB 11.3* 11.9* 10.5* 11.5* 10.9*  HCT 33.0* 35.0* 31.0* 34.2* 32.0*  MCV 97.9  --   --  97.4  --   PLT 265  --   --  224  --    BMP &GFR Recent Labs  Lab 05/25/24 2221 05/25/24 2222 05/25/24 2301 05/26/24 0044 05/26/24 0455 05/26/24 0816  NA 132* 136 136 139 140 141  K 4.1 4.2 3.5 4.1 3.4* 3.4*  CL 95* 96*  --  100 103  --   CO2 23  --   --  24 24  --   GLUCOSE 683* >700*  --  412* 182*  --   BUN 28* 30*  --  25* 22  --   CREATININE 1.75* 1.60*  --  1.83* 1.45*  --   CALCIUM  9.3  --   --  9.4 9.5  --    Estimated Creatinine Clearance: 19.1 mL/min (  A) (by C-G formula based on SCr of 1.45 mg/dL (H)). Liver & Pancreas: Recent Labs  Lab 05/25/24 2221  AST 38  ALT 22  ALKPHOS 150*  BILITOT 0.3  PROT 7.1  ALBUMIN 3.8   No results for input(s): LIPASE, AMYLASE in the last 168 hours. Recent Labs  Lab 05/26/24 0805  AMMONIA 19   Diabetic: No results for input(s): HGBA1C in the last 72 hours. Recent Labs  Lab 05/26/24 0450 05/26/24 0604 05/26/24 0849 05/26/24 1131 05/26/24 1134  GLUCAP 182* 197* 183* 356* 275*   Cardiac Enzymes: No results for input(s): CKTOTAL, CKMB, CKMBINDEX, TROPONINI in the last 168 hours. No results for input(s): PROBNP in the last 8760 hours. Coagulation Profile: Recent Labs  Lab 05/25/24 2221  INR 1.0   Thyroid  Function Tests: Recent Labs    05/26/24 0805  TSH 0.379   Lipid Profile: No results for input(s): CHOL, HDL, LDLCALC, TRIG, CHOLHDL, LDLDIRECT in the last 72 hours. Anemia Panel: Recent Labs    05/26/24 0805  VITAMINB12 686   Urine analysis:    Component Value Date/Time   COLORURINE STRAW (A) 05/26/2024 0542   APPEARANCEUR CLEAR 05/26/2024 0542   LABSPEC 1.027 05/26/2024 0542   PHURINE 6.0 05/26/2024 0542   GLUCOSEU >=500 (A) 05/26/2024 0542   HGBUR NEGATIVE 05/26/2024  0542   BILIRUBINUR NEGATIVE 05/26/2024 0542   KETONESUR NEGATIVE 05/26/2024 0542   PROTEINUR 100 (A) 05/26/2024 0542   UROBILINOGEN 0.2 07/29/2012 1237   NITRITE NEGATIVE 05/26/2024 0542   LEUKOCYTESUR NEGATIVE 05/26/2024 0542   Sepsis Labs: Invalid input(s): PROCALCITONIN, LACTICIDVEN  Microbiology: Recent Results (from the past 240 hours)  Resp panel by RT-PCR (RSV, Flu A&B, Covid)     Status: None   Collection Time: 05/26/24  5:10 AM   Specimen: Nasal Swab  Result Value Ref Range Status   SARS Coronavirus 2 by RT PCR NEGATIVE NEGATIVE Final   Influenza A by PCR NEGATIVE NEGATIVE Final   Influenza B by PCR NEGATIVE NEGATIVE Final    Comment: (NOTE) The Xpert Xpress SARS-CoV-2/FLU/RSV plus assay is intended as an aid in the diagnosis of influenza from Nasopharyngeal swab specimens and should not be used as a sole basis for treatment. Nasal washings and aspirates are unacceptable for Xpert Xpress SARS-CoV-2/FLU/RSV testing.  Fact Sheet for Patients: bloggercourse.com  Fact Sheet for Healthcare Providers: seriousbroker.it  This test is not yet approved or cleared by the [place]  FDA and has been authorized for detection and/or diagnosis of SARS-CoV-2 by FDA under an Emergency Use Authorization (EUA). This EUA will remain in effect (meaning this test can be used) for the duration of the COVID-19 declaration under Section 564(b)(1) of the Act, 21 U.S.C. section 360bbb-3(b)(1), unless the authorization is terminated or revoked.     Resp Syncytial Virus by PCR NEGATIVE NEGATIVE Final    Comment: (NOTE) Fact Sheet for Patients: bloggercourse.com  Fact Sheet for Healthcare Providers: seriousbroker.it  This test is not yet approved or cleared by the [place]  FDA and has been authorized for detection and/or diagnosis of SARS-CoV-2 by FDA under an Emergency Use  Authorization (EUA). This EUA will remain in effect (meaning this test can be used) for the duration of the COVID-19 declaration under Section 564(b)(1) of the Act, 21 U.S.C. section 360bbb-3(b)(1), unless the authorization is terminated or revoked.  Performed at [facility], [address], [place], [place] [address]   Culture, blood (x 2)     Status: None (Preliminary result)  Collection Time: 05/26/24  5:13 AM   Specimen: BLOOD  Result Value Ref Range Status   Specimen Description BLOOD RIGHT ANTECUBITAL  Final   Special Requests   Final    BOTTLES DRAWN AEROBIC AND ANAEROBIC Blood Culture adequate volume   Culture   Final    NO GROWTH < 12 HOURS Performed at [facility], [address], [place], [place] [address]    Report Status PENDING  Incomplete  Respiratory (~20 pathogens) panel by PCR     Status: None   Collection Time: 05/26/24  7:34 AM   Specimen: Nasopharyngeal Swab; Respiratory  Result Value Ref Range Status   Adenovirus NOT DETECTED NOT DETECTED Final   Coronavirus 229E NOT DETECTED NOT DETECTED Final    Comment: (NOTE) The Coronavirus on the Respiratory Panel, DOES NOT test for the novel  Coronavirus (2019 nCoV)    Coronavirus HKU1 NOT DETECTED NOT DETECTED Final   Coronavirus NL63 NOT DETECTED NOT DETECTED Final   Coronavirus OC43 NOT DETECTED NOT DETECTED Final   Metapneumovirus NOT DETECTED NOT DETECTED Final   Rhinovirus / Enterovirus NOT DETECTED NOT DETECTED Final   Influenza A NOT DETECTED NOT DETECTED Final   Influenza B NOT DETECTED NOT DETECTED Final   Parainfluenza Virus 1 NOT DETECTED NOT DETECTED Final   Parainfluenza Virus 2 NOT DETECTED NOT DETECTED Final   Parainfluenza Virus 3 NOT DETECTED NOT DETECTED Final   Parainfluenza Virus 4 NOT DETECTED NOT DETECTED Final   Respiratory Syncytial Virus NOT DETECTED NOT DETECTED Final   Bordetella pertussis NOT DETECTED NOT DETECTED Final   Bordetella Parapertussis NOT DETECTED NOT  DETECTED Final   Chlamydophila pneumoniae NOT DETECTED NOT DETECTED Final   Mycoplasma pneumoniae NOT DETECTED NOT DETECTED Final    Comment: Performed at [facility], [address] [place], [place] [address]    Radiology Studies: DG CHEST PORT 1 VIEW Result Date: 05/26/2024 EXAM: 1 VIEW(S) XRAY OF THE CHEST 05/26/2024 05:20:00 AM COMPARISON: 04/04/2024 CLINICAL HISTORY: Fever. FINDINGS: LUNGS AND PLEURA: No focal pulmonary opacity. No pleural effusion. No pneumothorax. HEART AND MEDIASTINUM: Aortic atherosclerosis. No acute abnormality of the cardiac and mediastinal silhouettes. BONES AND SOFT TISSUES: No acute osseous abnormality. IMPRESSION: 1. No acute findings. Electronically signed by: Waddell Calk MD 05/26/2024 05:25 AM EST RP Workstation: [id number]   CT ANGIO HEAD NECK W WO CM (CODE STROKE) Result Date: 05/25/2024 CLINICAL DATA:  Initial evaluation for acute neuro deficit, stroke. EXAM: CT ANGIOGRAPHY HEAD AND NECK WITH AND WITHOUT CONTRAST TECHNIQUE: Multidetector CT imaging of the head and neck was performed using the standard protocol during bolus administration of intravenous contrast. Multiplanar CT image reconstructions and MIPs were obtained to evaluate the vascular anatomy. Carotid stenosis measurements (when applicable) are obtained utilizing NASCET criteria, using the distal internal carotid diameter as the denominator. RADIATION DOSE REDUCTION: This exam was performed according to the departmental dose-optimization program which includes automated exposure control, adjustment of the mA and/or kV according to patient size and/or use of iterative reconstruction technique. CONTRAST:  75mL OMNIPAQUE  IOHEXOL  350 MG/ML SOLN COMPARISON:  CT from earlier the same day. FINDINGS: CTA NECK FINDINGS Aortic arch: Visualized aortic arch within normal limits for caliber with standard 3 vessel morphology. Aortic atherosclerosis. No significant stenosis about the origin the great vessels.  Right carotid system: Right common and internal carotid arteries are patent without dissection. Mild atheromatous change about the right carotid bulb without stenosis. Left carotid system: Left common and internal carotid arteries are  patent without dissection. Mild atheromatous change about the left carotid bulb without stenosis. Vertebral arteries: Both vertebral arteries arise from subclavian arteries vertebral arteries are patent without significant stenosis or dissection. Skeleton: Visualized osseous structures are diffusely heterogeneous. No discrete worrisome osseous lesions. Moderate multilevel cervical spondylosis. Other neck: No other acute finding. Upper chest: No other acute finding. Review of the MIP images confirms the above findings CTA HEAD FINDINGS Anterior circulation: Evaluation of the intracranial circulation limited by patient positioning. Atheromatous change about the carotid siphons without hemodynamically significant stenosis. A1 segments patent bilaterally. Left A1 dominant. Normal anterior communicating complex. Anterior cerebral arteries patent without stenosis. No M1 stenosis or occlusion. No proximal MCA branch occlusion. Distal MCA branches perfused and fairly symmetric. Posterior circulation: Both V4 segments patent without significant stenosis. Both PICA patent. Basilar patent without stenosis. Superior cerebral arteries patent bilaterally. Both PCAs primarily supplied via the basilar. Atheromatous change about the PCAs bilaterally with associated moderate multifocal right P2 stenoses (series 12, image 19). Left PCA patent without significant stenosis. Venous sinuses: Grossly patent allowing for timing the contrast bolus and positioning. Anatomic variants: None significant.  No visible aneurysm. Review of the MIP images confirms the above findings IMPRESSION: 1. Negative CTA for large vessel occlusion or other emergent finding. 2. Atheromatous change about the carotid bifurcations and  carotid siphons without hemodynamically significant stenosis. 3. Atheromatous change about the PCAs bilaterally with associated moderate multifocal right P2 stenoses. Aortic Atherosclerosis (ICD10-I70.0). These results were communicated to Dr. Vanessa at 10:56 p.m. on 05/25/2024 by text page via the [facility] messaging system. Electronically Signed   By: Morene Hoard M.D.   On: 05/25/2024 23:04   CT HEAD CODE STROKE WO CONTRAST (LKW 0-4.5h, LVO 0-24h) Result Date: 05/25/2024 CLINICAL DATA:  Code stroke. Initial evaluation for acute neuro deficit, stroke suspected. EXAM: CT HEAD WITHOUT CONTRAST TECHNIQUE: Contiguous axial images were obtained from the base of the skull through the vertex without intravenous contrast. RADIATION DOSE REDUCTION: This exam was performed according to the departmental dose-optimization program which includes automated exposure control, adjustment of the mA and/or kV according to patient size and/or use of iterative reconstruction technique. COMPARISON:  Prior study from 04/10/2024. FINDINGS: Brain: Generalized age-related cerebral atrophy with moderate chronic microvascular ischemic disease. No acute intracranial hemorrhage. No acute large vessel territory infarct. No mass lesion or midline shift. Diffuse ventricular prominence related global parenchymal volume loss without hydrocephalus. No extra-axial fluid collection. Vascular: No abnormal hyperdense vessel. Calcified atherosclerosis present at the skull base. Skull: Scalp soft tissues within normal limits.  Calvarium intact. Sinuses/Orbits: Globes and orbital soft tissues demonstrate no acute finding. Visualized paranasal sinuses are clear. No mastoid effusion. Other: None. ASPECTS [facility] Stroke Program Early CT Score) - Ganglionic level infarction (caudate, lentiform nuclei, internal capsule, insula, M1-M3 cortex): 7 - Supraganglionic infarction (M4-M6 cortex): 3 Total score (0-10 with 10 being normal): 10 IMPRESSION: 1.  No acute intracranial abnormality. 2. ASPECTS is 10. 3. Age-related cerebral atrophy with moderate chronic microvascular ischemic disease. These results were communicated to Dr. Vanessa at 10:33 pm on 05/25/2024 by text page via the [facility] messaging system. Electronically Signed   By: Morene Hoard M.D.   On: 05/25/2024 22:35      Denni France T. Becker [facility]  If 7PM-7AM, please contact night-coverage [email or web address] 05/26/2024, 12:05 PM   "

## 2024-05-26 NOTE — Progress Notes (Signed)
 Pharmacy Antibiotic Note  [name] is a 89 y.o. female admitted on 05/25/2024 with sepsis.  Pharmacy has been consulted for Zosyn  dosing.  Plan: Zosyn  2.25 g IV q8h (4 hour infusion).  Weight: 49 kg (108 lb 0.4 oz)  Temp (24hrs), Avg:100 F (37.8 C), Min:97.9 F (36.6 C), Max:102.1 F (38.9 C)  Recent Labs  Lab 05/25/24 2221 05/25/24 2222 05/26/24 0044 05/26/24 0455 05/26/24 0536  WBC 9.4  --  11.5*  --   --   CREATININE 1.75* 1.60* 1.83* 1.45*  --   LATICACIDVEN  --   --   --   --  1.3    Estimated Creatinine Clearance: 19.1 mL/min (A) (by C-G formula based on SCr of 1.45 mg/dL (H)).    Allergies[1]   Microbiology results: Pending   Thank you for allowing pharmacy to be a part of this patients care.  Mar Walmer M Francetta Ilg 05/26/2024 8:20 AM     [1]  Allergies Allergen Reactions   Ciprofloxacin  Other (See Comments)    Dizziness    Morphine And Codeine Other (See Comments)    Fuzzy feeling

## 2024-05-26 NOTE — ED Notes (Signed)
 Mildly restless sleep, NAD, calm, NSR on monitor, HR 70, VSS.

## 2024-05-26 NOTE — Procedures (Addendum)
 Patient Name: [name]  MRN: [id number]  Epilepsy Attending: Arlin MALVA Krebs  Referring Physician/Provider: Voncile Isles, MD  Date: 05/26/2024 Duration: 22.49 mins  Patient history: 89yo F who presents from her facility with R sided weakness and aphasia with L gaze deviation. EEG to evaluate for seizure  Level of alertness: Awake  AEDs during EEG study: LEV  Technical aspects: This EEG study was done with scalp electrodes positioned according to the 10-20 International system of electrode placement. Electrical activity was reviewed with band pass filter of 1-70Hz , sensitivity of 7 uV/mm, display speed of 30mm/sec with a 60Hz  notched filter applied as appropriate. EEG data were recorded continuously and digitally stored.  Video monitoring was available and reviewed as appropriate.  Description: EEG showed continuous generalized 3 to 6 Hz theta-delta slowing. Generalized periodic discharges with triphasic morphology at 0.25 to 1  Hz were also noted. Hyperventilation and photic stimulation were not performed.     ABNORMALITY - Periodic discharges with triphasic morphology, generalized ( GPDs) - Continuous slow, generalized  IMPRESSION: This study is suggestive of generalized cerebral dysfunction (encephalopathy). Generalized periodic discharges with triphasic morphology were also noted which can be on the ictal-interictal continuum. However, given the morphology and frequency, this is most likely related to toxic-metabolic etiology. No seizures or definite epileptiform discharges were seen throughout the recording.  Deeya Richeson O Tajanae Guilbault

## 2024-05-26 NOTE — H&P (Signed)
 " History and Physical    Patient: [name] FMW:[id number] DOB: 08/03/1929 DOA: 05/25/2024 DOS: the patient was seen and examined on 05/26/2024 PCP: [name], NP  Patient coming from: SNF  Chief Complaint:  Chief Complaint  Patient presents with   Code Stroke   HPI: [name] is a 89 y.o. female with medical history significant of type 2 diabetes, diabetic neuropathy, hyperlipidemia, essential hypertension, diabetic neuropathy, osteoarthritis, who was brought in from [place] for concern about stroke or seizure-like activity.  A code stroke was called and neurology consulted.  Stroke has been ruled out.  Patient was noted to be having shaking with left gaze preference and right side deficits.  EMS also reported markedly elevated CBG.  No prior history of seizure.  Patient blood glucose was more than 600.  Blood pressure 198/80.  Initial workup encouraging head CT was negative.  Patient appears to have hyperosmolar hyperglycemic state.  Patient unable to give any history at the moment.  She will be admitted for hyperosmolar hyperglycemia.  Review of Systems: As mentioned in the history of present illness. All other systems reviewed and are negative. Past Medical History:  Diagnosis Date   Arthritis    Chronic back pain    Cystoid macular edema of right eye 08/19/2019   Diabetic neuropathy (HCC)    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) 11/18/2019   Hypercholesteremia    Hypertension    Liver lesion    Neuropathy    Type II or unspecified type diabetes mellitus without mention of complication, not stated as uncontrolled    Vitamin D  deficiency    Wears glasses    Past Surgical History:  Procedure Laterality Date   ABDOMINAL HYSTERECTOMY     APPENDECTOMY     BACK SURGERY  1993   lumb lam-fusion   BREAST EXCISIONAL BIOPSY Right 1967   benign   CESAREAN SECTION     x 3    COLONOSCOPY     EYE SURGERY      both cataracts   TRIGGER FINGER RELEASE  01/10/2012   Procedure: RELEASE TRIGGER FINGER/A-1 PULLEY;  Surgeon: Sam KANDICE Herald, MD;  Location: [facility];  Service: Orthopedics;  Laterality: Right;  right ring trigger release   Social History:  reports that she has never smoked. She has never used smokeless tobacco. She reports that she does not drink alcohol and does not use drugs.  Allergies[1]  Family History  Problem Relation Age of Onset   Diabetes Sister    Liver disease Sister    Liver cancer Sister    Lymphoma Sister    Stroke Brother    Diabetes Brother    Kidney cancer Brother    Colon cancer Neg Hx     Prior to Admission medications  Medication Sig Start Date End Date Taking? Authorizing Provider  acetaminophen  (TYLENOL ) 325 MG tablet Take 325-650 mg by mouth every 6 (six) hours as needed (for pain or headaches).    [provider]  aspirin  EC 81 MG tablet Take 1 tablet (81 mg total) by mouth daily. Swallow whole. 04/12/24 04/12/25  Patsy Lenis, MD  bisoprolol  (ZEBETA ) 5 MG tablet Take 2.5 mg by mouth in the morning.    [provider]  cephALEXin  (KEFLEX ) 250 MG capsule Take 250 mg by mouth daily with breakfast.    [provider]  CRANBERRY PO Take 1 tablet by mouth See admin instructions. Cranberry 500 mg +  bacillus sporogenes + calcium  + Vitamin C = Take 1 caplet by mouth at bedtime    [provider]  D-MANNOSE PO Take 700 mg by mouth at bedtime.    [provider]  Insulin  Glargine (BASAGLAR  KWIKPEN) 100 UNIT/ML Inject 7 Units into the skin at bedtime as needed (for a BGL of 200 or greater). 04/12/24   Patsy Lenis, MD  Melatonin 5 MG CHEW Chew 5 mg by mouth at bedtime.    [provider]  Multiple Vitamins-Minerals (CENTRUM SILVER WOMEN 50+ PO) Take 1 tablet by mouth daily with breakfast.    [provider]  QUEtiapine  (SEROQUEL ) 25 MG tablet Take 0.5 tablets (12.5 mg total) by mouth at  bedtime. 04/12/24   Patsy Lenis, MD  rosuvastatin  (CRESTOR ) 20 MG tablet Take 10 mg by mouth in the morning.    [provider]  traMADol  (ULTRAM ) 50 MG tablet Take 1 tablet (50 mg total) by mouth every 12 (twelve) hours as needed. 04/12/24   Patsy Lenis, MD    Physical Exam: Vitals:   05/25/24 2200 05/25/24 2258 05/25/24 2301 05/25/24 2330  BP:   (!) 191/84 136/89  Pulse:   62 64  Resp:   17 16  SpO2:  98% 100% 100%  Weight: 49 kg      Constitutional: Obtunded, unresponsive, NAD, calm, comfortable Eyes: PERRL, lids and conjunctivae normal ENMT: Mucous membranes are dry. Posterior pharynx clear of any exudate or lesions.Normal dentition.  Neck: normal, supple, no masses, no thyromegaly Respiratory: clear to auscultation bilaterally, no wheezing, no crackles. Normal respiratory effort. No accessory muscle use.  Cardiovascular: Regular rate and rhythm, no murmurs / rubs / gallops. No extremity edema. 2+ pedal pulses. No carotid bruits.  Abdomen: no tenderness, no masses palpated. No hepatosplenomegaly. Bowel sounds positive.  Musculoskeletal: Good range of motion, no joint swelling or tenderness, Skin: no rashes, lesions, ulcers. No induration Neurologic: CN 2-12 grossly intact. Sensation intact, DTR normal. Strength 5/5 in all 4.  Psychiatric: Obtunded, confused, unresponsive  Data Reviewed:  Vitals are stable except blood pressure 198/84: pH is 7.4, glucose more than 700, sodium 132, BUN 30 creatinine 1.6 and calcium  1.1.  Osmolality 337 hemoglobin 10.5 beta-hydroxybutyrate acid 0.14 head CT without contrast showed no acute acute abnormalities.  CT angiogram head and neck is negative  Assessment and Plan:  #1 hyperosmolar hyperglycemic state: Patient has insulin -dependent diabetes.  Initiate aggressive fluid resuscitation and IV insulin .  Admit to progressive care.  Will continue the protocol until glucose improves.  #2 acute metabolic encephalopathy: Most likely due  to HHS.  Continue to monitor.  Neurology has ruled out CVA.  #3 insulin -dependent diabetes: As per above.  #4 chronic kidney disease stage IIIb: Continue close monitor  5 hyperlipidemia: Continue statin  #6 history of CVA: Will continue with previous medications when patient is able to take them.  #7 anemia of chronic disease: Continue to monitor H&H.    Advance Care Planning:   Code Status: Limited: Do not attempt resuscitation (DNR) -DNR-LIMITED -Do Not Intubate/DNI    Consults: None at the moment  Family Communication: No family at bedside  Severity of Illness: The appropriate patient status for this patient is INPATIENT. Inpatient status is judged to be reasonable and necessary in order to provide the required intensity of service to ensure the patient's safety. The patient's presenting symptoms, physical exam findings, and initial radiographic and laboratory data in the context of their chronic comorbidities is felt to place them at high  risk for further clinical deterioration. Furthermore, it is not anticipated that the patient will be medically stable for discharge from the hospital within 2 midnights of admission.   * I certify that at the point of admission it is my clinical judgment that the patient will require inpatient hospital care spanning beyond 2 midnights from the point of admission due to high intensity of service, high risk for further deterioration and high frequency of surveillance required.*  AuthorBETHA SIM KNOLL, MD 05/26/2024 12:06 AM  For on call review [email or web address].      [1]  Allergies Allergen Reactions   Ciprofloxacin  Other (See Comments)    Dizziness    Morphine And Codeine Other (See Comments)    Fuzzy feeling   "

## 2024-05-26 NOTE — ED Notes (Signed)
 Lab called, RVP added, pending result

## 2024-05-26 NOTE — Progress Notes (Signed)
 EEG complete - results pending. LTM EEG to follow routine.

## 2024-05-26 NOTE — Progress Notes (Signed)
 LTM EEG hooked up and running - no initial skin breakdown - push button tested - [facility] monitoring.CT leads used

## 2024-05-26 NOTE — Progress Notes (Addendum)
 NEUROLOGY CONSULT FOLLOW UP NOTE   Date of service: May 26, 2024 Patient Name: [name] MRN:  [id number] DOB:  Aug 30, 1929  Interval Hx/subjective   Patient remains hemodynamically stable and afebrile overnight.  She had received some Versed  prior to exam and was quite drowsy but did respond to noxious stimuli in all 4 extremities Vitals   Vitals:   05/26/24 0945 05/26/24 1000 05/26/24 1015 05/26/24 1030  BP:  (!) 160/67    Pulse: 77 77 77 75  Resp: 19 17 19  (!) 25  Temp:      SpO2: 100% 100% 100% 100%  Weight:         Body mass index is 19.76 kg/m.  Physical Exam   Constitutional: Frail-appearing elderly patient in no acute distress Eyes: No scleral injection.  HENT: No OP obstrucion.  Head: Normocephalic.  Cardiovascular: Normal rate and regular rhythm.  Respiratory: Effort normal, non-labored breathing.  Skin: WDI.   Neurologic Examination    NEURO:  Mental Status: Patient rests with eyes closed, unable to state name or answer questions, does not follow commands Speech/Language: No verbal output  Cranial Nerves:  VII: Face is symmetrical resting and grimacing. XII: Noncooperative with tongue protrusion Motor: Moves all 4 extremities to proximal pinch but does not lift lower extremities off the bed Tone: is normal and bulk is reduced Sensation- Intact to noxious throughout Coordination: Unable to perform Gait- deferred   Medications Current Medications[1]  Labs and Diagnostic Imaging   CBC:  Recent Labs  Lab 05/25/24 2221 05/25/24 2222 05/26/24 0044 05/26/24 0816  WBC 9.4  --  11.5*  --   NEUTROABS 5.8  --   --   --   HGB 11.3*   < > 11.5* 10.9*  HCT 33.0*   < > 34.2* 32.0*  MCV 97.9  --  97.4  --   PLT 265  --  224  --    < > = values in this interval not displayed.    Basic Metabolic Panel:  Lab Results  Component Value Date   NA 141 05/26/2024   K 3.4 (L) 05/26/2024   CO2 24 05/26/2024   GLUCOSE 182 (H) 05/26/2024   BUN 22  05/26/2024   CREATININE 1.45 (H) 05/26/2024   CALCIUM  9.5 05/26/2024   GFRNONAA 34 (L) 05/26/2024   GFRAA 41 (L) 05/21/2020   Lipid Panel:  Lab Results  Component Value Date   LDLCALC 34 02/21/2023   HgbA1c:  Lab Results  Component Value Date   HGBA1C 7.2 (H) 04/04/2024   Urine Drug Screen:     Component Value Date/Time   LABOPIA NEGATIVE 03/29/2024 1835   COCAINSCRNUR NEGATIVE 03/29/2024 1835   LABBENZ NEGATIVE 03/29/2024 1835   AMPHETMU NEGATIVE 03/29/2024 1835   THCU NEGATIVE 03/29/2024 1835   LABBARB NEGATIVE 03/29/2024 1835    Alcohol Level     Component Value Date/Time   ETH <15 05/25/2024 2221   INR  Lab Results  Component Value Date   INR 1.0 05/25/2024   APTT  Lab Results  Component Value Date   APTT 24 05/25/2024   CT Head without contrast(Personally reviewed): No acute abnormality, atrophy and moderate chronic small vessel ischemic disease  CT angio Head and Neck with contrast(Personally reviewed): No LVO, moderate right P2 stenosis  MRI Brain(Personally reviewed): Pending  Continuous EEG:  Pending  Assessment   [name] is a 89 y.o. female with history of diabetes with neuropathy, hypertension, hyperlipidemia, mild dementia and macular  degeneration who presented with seizure like activity followed by right-sided weakness and aphasia with left gaze deviation.  Initial CT and CTA were negative for acute abnormalities.  Her blood glucose was noted to be greater than 700 on presentation.  She was loaded on Keppra .  No further seizure activity has been observed, but patient continues to be drowsy and poorly responsive.  However, she did receive Versed  for nausea prior to exam, and this may contribute.  Suspect that there is also a component of toxic metabolic encephalopathy.  Will connect to LTM EEG to observe for further seizure activity and perform MRI brain.  Impression: Provoked seizure followed by toxic metabolic encephalopathy in the setting  of hyperglycemia  Recommendations  -EEG - Continue Keppra  500 mg twice daily at least until  hyperglycemia resolved - MRI brain - Treatment of hyperglycemia per primary team - Neurology will continue to follow ______________________________________________________________________ Patient seen by NP with MD, MD to edit note as needed.  Signed, Cortney E Everitt Clint Kill, NP [facility]    Attending Neurohospitalist Addendum Patient seen and examined with APP/Resident. Agree with the history and physical as documented above. Agree with the plan as documented, which I helped formulate. I have independently reviewed the chart, obtained history, review of systems and examined the patient.I have personally reviewed pertinent head/neck/spine imaging (CT/MRI).  EEG completed-shows triphasics but could also reflect changes on the ictal ictal interictal continuum.. Likely toxic metabolic encephalopathy after provoked seizure from toxic metabolic encephalopathy insult such as hyperglycemia, but since she is not completely back to baseline, will hook her up to LTM EEG.  Plan as above. Relayed to Dr. Kathrin  Please feel free to call with any questions.  -- Eligio Lav, MD Neurologist [facility]    Addendum MRI of the brain completed and reviewed. It reveals a focal area of restricted diffusion within the left posterior frontal corona radiata likely representing an acute lacunar infarct.  There is also a small focus of possible restricted diffusion within the right Curlee Popp of mildly at the frontoparietal junction.  There is foci of susceptibility/blooming within the right thalamus and left internal capsule most compatible with chronic microhemorrhage.  Moderate diffuse periventricular deep cerebral white matter disease. The strokes are likely small vessel etiology due to uncontrolled risk factors. I will order a full stroke risk factor workup as well.  -- Eligio Lav, MD Neurologist [facility]     [1]  Current Facility-Administered Medications:    dextrose  50 % solution 0-50 mL, 0-50 mL, Intravenous, PRN, Trifan, Donnice PARAS, MD   enoxaparin  (LOVENOX ) injection 30 mg, 30 mg, Subcutaneous, Q24H, Garba, Mohammad L, MD, 30 mg at 05/26/24 1002   insulin  aspart (novoLOG ) injection 0-9 Units, 0-9 Units, Subcutaneous, Q4H, Opyd, Timothy S, MD   insulin  glargine-yfgn injection 6 Units, 6 Units, Subcutaneous, Q24H, Opyd, Timothy S, MD, 6 Units at 05/26/24 9377   lactated ringers  bolus 980 mL, 20 mL/kg, Intravenous, Once, Sim Emery CROME, MD   lactated ringers  infusion, , Intravenous, Continuous, Gonfa, Taye T, MD, Last Rate: 75 mL/hr at 05/26/24 0802, Restarted at 05/26/24 0802   levETIRAcetam  (KEPPRA ) undiluted injection 500 mg, 500 mg, Intravenous, BID, Khaliqdina, Salman, MD, 500 mg at 05/26/24 9063   linezolid  (ZYVOX ) IVPB 600 mg, 600 mg, Intravenous, Q12H, Gonfa, Taye T, MD, Last Rate: 300 mL/hr at 05/26/24 0950, 600 mg at 05/26/24 0950   LORazepam  (ATIVAN ) injection 1 mg, 1 mg, Intravenous, Once PRN, Khaliqdina, Salman, MD   piperacillin -tazobactam (  ZOSYN ) IVPB 2.25 g, 2.25 g, Intravenous, Q8H, Denninger, Jade M, RPH, Stopped at 05/26/24 1019   potassium chloride  10 mEq in 100 mL IVPB, 10 mEq, Intravenous, Q1 Hr x 3, Gonfa, Taye T, MD, Last Rate: 100 mL/hr at 05/26/24 0942, 10 mEq at 05/26/24 [phone]

## 2024-05-26 NOTE — ED Notes (Addendum)
 No signs of sz activity, resting/ sleeping, NAD, calm, still. Intermittently restless. Mittens remain on hands. VSS. MAE to pain, x4.

## 2024-05-26 NOTE — Progress Notes (Signed)
 SLP Cancellation Note  Patient Details Name: [name] MRN: [id number] DOB: 02-17-1930   Cancelled treatment:        Attempted to see pt for swallowing assessment.  Pt has been very lethargic.  Neuro in room at time of attempt, pt responsive to pain but unable to rouse.  Per nursing, pt has received sedative medications. Will hold evaluation until pt is more alert.  SLP will return as schedule permits.  Please message via secure chat if pt is more alert later today.    Anette FORBES Grippe, MA, CCC-SLP Acute Rehabilitation Services Office: [phone] 05/26/2024, 10:12 AM

## 2024-05-26 NOTE — ED Notes (Signed)
 SLP into room, at [facility]. Neuro MD into room at [facility].

## 2024-05-26 NOTE — Plan of Care (Signed)
 MRI brain without acute stroke.  Neurology on board.  Updated patient's son, Ozell over the phone.

## 2024-05-26 NOTE — ED Notes (Signed)
 Vbg results to jon c.rn by at

## 2024-05-27 ENCOUNTER — Inpatient Hospital Stay (HOSPITAL_COMMUNITY)

## 2024-05-27 DIAGNOSIS — I639 Cerebral infarction, unspecified: Secondary | ICD-10-CM | POA: Diagnosis not present

## 2024-05-27 DIAGNOSIS — E1165 Type 2 diabetes mellitus with hyperglycemia: Secondary | ICD-10-CM | POA: Diagnosis not present

## 2024-05-27 DIAGNOSIS — I69391 Dysphagia following cerebral infarction: Secondary | ICD-10-CM | POA: Diagnosis not present

## 2024-05-27 DIAGNOSIS — R569 Unspecified convulsions: Secondary | ICD-10-CM | POA: Diagnosis not present

## 2024-05-27 DIAGNOSIS — I6389 Other cerebral infarction: Secondary | ICD-10-CM

## 2024-05-27 DIAGNOSIS — I6381 Other cerebral infarction due to occlusion or stenosis of small artery: Secondary | ICD-10-CM | POA: Diagnosis not present

## 2024-05-27 DIAGNOSIS — E1151 Type 2 diabetes mellitus with diabetic peripheral angiopathy without gangrene: Secondary | ICD-10-CM | POA: Diagnosis not present

## 2024-05-27 DIAGNOSIS — R509 Fever, unspecified: Secondary | ICD-10-CM

## 2024-05-27 DIAGNOSIS — N189 Chronic kidney disease, unspecified: Secondary | ICD-10-CM

## 2024-05-27 DIAGNOSIS — E118 Type 2 diabetes mellitus with unspecified complications: Secondary | ICD-10-CM | POA: Diagnosis not present

## 2024-05-27 DIAGNOSIS — Z794 Long term (current) use of insulin: Secondary | ICD-10-CM

## 2024-05-27 DIAGNOSIS — D72829 Elevated white blood cell count, unspecified: Secondary | ICD-10-CM | POA: Diagnosis not present

## 2024-05-27 DIAGNOSIS — G9341 Metabolic encephalopathy: Secondary | ICD-10-CM | POA: Diagnosis not present

## 2024-05-27 DIAGNOSIS — G9340 Encephalopathy, unspecified: Secondary | ICD-10-CM | POA: Diagnosis not present

## 2024-05-27 DIAGNOSIS — I10 Essential (primary) hypertension: Secondary | ICD-10-CM | POA: Diagnosis not present

## 2024-05-27 DIAGNOSIS — E1100 Type 2 diabetes mellitus with hyperosmolarity without nonketotic hyperglycemic-hyperosmolar coma (NKHHC): Secondary | ICD-10-CM

## 2024-05-27 DIAGNOSIS — E785 Hyperlipidemia, unspecified: Secondary | ICD-10-CM | POA: Diagnosis not present

## 2024-05-27 LAB — CSF CELL COUNT WITH DIFFERENTIAL
Eosinophils, CSF: 0 % (ref 0–1)
Lymphs, CSF: 7 % — ABNORMAL LOW (ref 40–80)
Monocyte-Macrophage-Spinal Fluid: 12 % — ABNORMAL LOW (ref 15–45)
RBC Count, CSF: 1 /mm3 — ABNORMAL HIGH
Segmented Neutrophils-CSF: 81 % — ABNORMAL HIGH (ref 0–6)
Tube #: 3
WBC, CSF: 48 /mm3 (ref 0–5)

## 2024-05-27 LAB — ECHOCARDIOGRAM COMPLETE
AR max vel: 2.41 cm2
AV Area VTI: 2.67 cm2
AV Area mean vel: 1.95 cm2
AV Mean grad: 4 mmHg
AV Peak grad: 5.5 mmHg
Ao pk vel: 1.17 m/s
Area-P 1/2: 1.68 cm2
Calc EF: 64.2 %
S' Lateral: 2.1 cm
Single Plane A2C EF: 67 %
Single Plane A4C EF: 56.8 %
Weight: 1728.41 [oz_av]

## 2024-05-27 LAB — COMPREHENSIVE METABOLIC PANEL WITH GFR
ALT: 18 U/L (ref 0–44)
AST: 44 U/L — ABNORMAL HIGH (ref 15–41)
Albumin: 3.8 g/dL (ref 3.5–5.0)
Alkaline Phosphatase: 84 U/L (ref 38–126)
Anion gap: 17 — ABNORMAL HIGH (ref 5–15)
BUN: 19 mg/dL (ref 8–23)
CO2: 22 mmol/L (ref 22–32)
Calcium: 9.4 mg/dL (ref 8.9–10.3)
Chloride: 100 mmol/L (ref 98–111)
Creatinine, Ser: 1.61 mg/dL — ABNORMAL HIGH (ref 0.44–1.00)
GFR, Estimated: 30 mL/min — ABNORMAL LOW
Glucose, Bld: 201 mg/dL — ABNORMAL HIGH (ref 70–99)
Potassium: 3.6 mmol/L (ref 3.5–5.1)
Sodium: 138 mmol/L (ref 135–145)
Total Bilirubin: 0.7 mg/dL (ref 0.0–1.2)
Total Protein: 7.1 g/dL (ref 6.5–8.1)

## 2024-05-27 LAB — GLUCOSE, CAPILLARY
Glucose-Capillary: 106 mg/dL — ABNORMAL HIGH (ref 70–99)
Glucose-Capillary: 120 mg/dL — ABNORMAL HIGH (ref 70–99)
Glucose-Capillary: 129 mg/dL — ABNORMAL HIGH (ref 70–99)
Glucose-Capillary: 187 mg/dL — ABNORMAL HIGH (ref 70–99)
Glucose-Capillary: 205 mg/dL — ABNORMAL HIGH (ref 70–99)
Glucose-Capillary: 249 mg/dL — ABNORMAL HIGH (ref 70–99)

## 2024-05-27 LAB — CRYPTOCOCCAL ANTIGEN, CSF: Crypto Ag: NEGATIVE

## 2024-05-27 LAB — MENINGITIS/ENCEPHALITIS PANEL (CSF)

## 2024-05-27 LAB — HEMOGLOBIN A1C
Hgb A1c MFr Bld: 10.6 % — ABNORMAL HIGH (ref 4.8–5.6)
Mean Plasma Glucose: 257.52 mg/dL

## 2024-05-27 LAB — LIPID PANEL
Cholesterol: 112 mg/dL (ref 0–200)
HDL: 48 mg/dL
LDL Cholesterol: 35 mg/dL (ref 0–99)
Total CHOL/HDL Ratio: 2.3 ratio
Triglycerides: 142 mg/dL
VLDL: 28 mg/dL (ref 0–40)

## 2024-05-27 LAB — CBC
HCT: 36.8 % (ref 36.0–46.0)
Hemoglobin: 12.8 g/dL (ref 12.0–15.0)
MCH: 33 pg (ref 26.0–34.0)
MCHC: 34.8 g/dL (ref 30.0–36.0)
MCV: 94.8 fL (ref 80.0–100.0)
Platelets: 182 10*3/uL (ref 150–400)
RBC: 3.88 MIL/uL (ref 3.87–5.11)
RDW: 12.5 % (ref 11.5–15.5)
WBC: 19.4 10*3/uL — ABNORMAL HIGH (ref 4.0–10.5)
nRBC: 0 % (ref 0.0–0.2)

## 2024-05-27 LAB — PROTEIN AND GLUCOSE, CSF
Glucose, CSF: 124 mg/dL — ABNORMAL HIGH (ref 40–70)
Total  Protein, CSF: 103 mg/dL — ABNORMAL HIGH (ref 15–45)

## 2024-05-27 LAB — LACTIC ACID, PLASMA
Lactic Acid, Venous: 3.6 mmol/L (ref 0.5–1.9)
Lactic Acid, Venous: 1.9 mmol/L (ref 0.5–1.9)
Lactic Acid, Venous: 2.3 mmol/L (ref 0.5–1.9)

## 2024-05-27 LAB — MAGNESIUM: Magnesium: 1.7 mg/dL (ref 1.7–2.4)

## 2024-05-27 LAB — SYPHILIS: RPR W/REFLEX TO RPR TITER AND TREPONEMAL ANTIBODIES, TRADITIONAL SCREENING AND DIAGNOSIS ALGORITHM: RPR Ser Ql: NONREACTIVE

## 2024-05-27 LAB — CK: Total CK: 505 U/L — ABNORMAL HIGH (ref 38–234)

## 2024-05-27 LAB — PHOSPHORUS: Phosphorus: 2 mg/dL — ABNORMAL LOW (ref 2.5–4.6)

## 2024-05-27 MED ORDER — SODIUM CHLORIDE 0.9 % IV SOLN
2.0000 g | Freq: Two times a day (BID) | INTRAVENOUS | Status: DC
  Administered 2024-05-27 – 2024-05-29 (×4): 2 g via INTRAVENOUS
  Filled 2024-05-27 (×4): qty 20

## 2024-05-27 MED ORDER — SODIUM CHLORIDE 0.9 % IV SOLN
2.0000 g | Freq: Three times a day (TID) | INTRAVENOUS | Status: DC
  Administered 2024-05-27 – 2024-05-28 (×3): 2 g via INTRAVENOUS
  Filled 2024-05-27 (×4): qty 2000

## 2024-05-27 MED ORDER — LIDOCAINE 1 % OPTIME INJ - NO CHARGE
5.0000 mL | Freq: Once | INTRAMUSCULAR | Status: AC
  Administered 2024-05-27: 1 mL via INTRADERMAL

## 2024-05-27 MED ORDER — VANCOMYCIN HCL IN DEXTROSE 1-5 GM/200ML-% IV SOLN
1000.0000 mg | Freq: Once | INTRAVENOUS | Status: AC
  Administered 2024-05-27: 1000 mg via INTRAVENOUS
  Filled 2024-05-27: qty 200

## 2024-05-27 MED ORDER — VANCOMYCIN HCL 500 MG/100ML IV SOLN
500.0000 mg | INTRAVENOUS | Status: DC
  Administered 2024-05-29: 500 mg via INTRAVENOUS
  Filled 2024-05-27 (×2): qty 100

## 2024-05-27 MED ORDER — DEXTROSE 5 % IV SOLN
500.0000 mg | INTRAVENOUS | Status: DC
  Administered 2024-05-27: 500 mg via INTRAVENOUS
  Filled 2024-05-27 (×2): qty 10

## 2024-05-27 MED ORDER — INSULIN GLARGINE-YFGN 100 UNIT/ML ~~LOC~~ SOLN
6.0000 [IU] | Freq: Two times a day (BID) | SUBCUTANEOUS | Status: DC
  Administered 2024-05-27 – 2024-05-28 (×3): 6 [IU] via SUBCUTANEOUS
  Filled 2024-05-27 (×4): qty 0.06

## 2024-05-27 NOTE — Procedures (Signed)
 PROCEDURE SUMMARY:  Successful fluoroscopic guided lumbar puncture.  Opening pressure was 15 cm H2O ~10 mL clear colorless fluid collected and sent for labs.  No immediate complications.  Pt tolerated well.   EBL = none  Please see full dictation in imaging section of Epic for procedure details.   Kross Swallows NP 05/27/2024 3:53 PM

## 2024-05-27 NOTE — Procedures (Signed)
 Patient Name: [name]  MRN: [id number]  Epilepsy Attending: Arlin MALVA Krebs  Referring Physician/Provider: Voncile Isles, MD  Duration: 05/26/2024 1640 to 05/27/2024 1640   Patient history: 89yo F who presents from her facility with R sided weakness and aphasia with L gaze deviation. EEG to evaluate for seizure   Level of alertness: Awake   AEDs during EEG study: LEV   Technical aspects: This EEG study was done with scalp electrodes positioned according to the 10-20 International system of electrode placement. Electrical activity was reviewed with band pass filter of 1-70Hz , sensitivity of 7 uV/mm, display speed of 29mm/sec with a 60Hz  notched filter applied as appropriate. EEG data were recorded continuously and digitally stored.  Video monitoring was available and reviewed as appropriate.   Description: EEG showed continuous generalized 3 to 6 Hz theta-delta slowing. Generalized periodic discharges with triphasic morphology at 0.25 to 1  Hz were also noted, more prominent when awake/stimulated. Hyperventilation and photic stimulation were not performed.      ABNORMALITY - Periodic discharges with triphasic morphology, generalized ( GPDs) - Continuous slow, generalized   IMPRESSION: This study is suggestive of generalized cerebral dysfunction (encephalopathy). Generalized periodic discharges with triphasic morphology were also noted which can be on the ictal-interictal continuum. However, given the morphology, frequency and reactivity to stimulation, this is most likely related to toxic-metabolic etiology. No seizures or definite epileptiform discharges were seen throughout the recording.   Taraneh Metheney O Elior Robinette

## 2024-05-27 NOTE — TOC Progression Note (Signed)
 Transition of Care [facility]) - Progression Note    Patient Details  Name: [name] MRN: [id number] Date of Birth: 09-30-1929  [facility] CM/SW Contact  Montie LOISE Louder, [place] Phone Number: 05/27/2024, 6:44 PM  Clinical Narrative:     CSW acknowledges recommendation for rehab at skilled nursing facility.  TOC following and will send out referrals once closer to medical readiness  Montie Louder, MSW, LCSW Clinical Social Worker                      Expected Discharge Plan and Services                                               Social Drivers of Health (SDOH) Interventions SDOH Screenings   Food Insecurity: Patient Unable To Answer (05/27/2024)  Housing: Low Risk (04/05/2024)  Transportation Needs: Patient Unable To Answer (04/05/2024)  Utilities: Patient Unable To Answer (04/05/2024)  Depression (PHQ2-9): Low Risk (09/22/2022)  Financial Resource Strain: Low Risk (12/29/2023)   Received from [facility]  Social Connections: Moderately Integrated (04/05/2024)  Tobacco Use: Low Risk (05/25/2024)    Readmission Risk Interventions    04/08/2024   10:37 AM 11/03/2022    1:11 PM  Readmission Risk Prevention Plan  Post Dischage Appt  Complete  Medication Screening  Complete  Transportation Screening Complete Complete  HRI or Home Care Consult Complete   Social Work Consult for Recovery Care Planning/Counseling Complete   Palliative Care Screening Complete   Medication Review Oceanographer) Complete

## 2024-05-27 NOTE — Plan of Care (Signed)
" °  Problem: Metabolic: Goal: Ability to maintain appropriate glucose levels will improve Outcome: Progressing   Problem: Skin Integrity: Goal: Risk for impaired skin integrity will decrease Outcome: Progressing   Problem: Tissue Perfusion: Goal: Adequacy of tissue perfusion will improve Outcome: Progressing   Problem: Fluid Volume: Goal: Ability to achieve a balanced intake and output will improve Outcome: Progressing   Problem: Metabolic: Goal: Ability to maintain appropriate glucose levels will improve Outcome: Progressing   Problem: Respiratory: Goal: Will regain and/or maintain adequate ventilation Outcome: Progressing   Problem: Urinary Elimination: Goal: Ability to achieve and maintain adequate renal perfusion and functioning will improve Outcome: Progressing   Problem: Clinical Measurements: Goal: Ability to maintain clinical measurements within normal limits will improve Outcome: Progressing Goal: Will remain free from infection Outcome: Progressing Goal: Diagnostic test results will improve Outcome: Progressing Goal: Respiratory complications will improve Outcome: Progressing Goal: Cardiovascular complication will be avoided Outcome: Progressing   Problem: Elimination: Goal: Will not experience complications related to bowel motility Outcome: Progressing Goal: Will not experience complications related to urinary retention Outcome: Progressing   Problem: Pain Managment: Goal: General experience of comfort will improve and/or be controlled Outcome: Progressing   Problem: Safety: Goal: Ability to remain free from injury will improve Outcome: Progressing   "

## 2024-05-27 NOTE — Plan of Care (Signed)

## 2024-05-27 NOTE — Progress Notes (Signed)
 OT Cancellation Note  Patient Details Name: LYNNEX FULP MRN: [id number] DOB: [birth year]   Cancelled Treatment:    Reason Eval/Treat Not Completed: Fatigue/lethargy limiting ability to participate (Spoke to nursing about level of alertness and will continue to follow.)  Shamika Pedregon K OTR/L  Acute Rehab Services  [phone] office number  Warrick Berber 05/27/2024, 7:38 AM

## 2024-05-27 NOTE — Progress Notes (Signed)
 SLP Cancellation Note  Patient Details Name: SHERRY BLACKARD MRN: [id number] DOB: [birth year]   Cancelled evaluation: Pt in procedure, with MD, then on third attempt, was not sufficiently rousable to participate in clinical swallowing evaluation.  Will continue efforts.  Kyrie Fludd L. Vona, MA CCC/SLP Clinical Specialist - Acute Care SLP Acute Rehabilitation Services Office number [phone]           [name] 05/27/2024, 3:15 PM

## 2024-05-27 NOTE — Progress Notes (Addendum)
 Pharmacy Antibiotic Note  [name] is a 89 y.o. female admitted on 05/25/2024 and now with concern for meningitis.  Pharmacy has been consulted for Vancomycin  and Acyclovir  dosing along with Ampicillin  and Rocephin .   Advanced age, low body weight, and CKD noted making CrCl hard to estimate. Will dose Vancomycin  for goal trough 15-20 mcg/ml while ruling out meningitis.   Plan: - Start Vancomycin  1g IV x 1 dose now followed by 500 mg IV every 24 hours (to start 2/3 @ 2200) - goal trough 15-20 mcg/ml - Start Acyclovir  500 mg IV every 24 hours - On NS @ 65 cc/hr for the next 24h - will f/u on continued MIVF tomorrow - Rocephin  2g IV every 12 hours - Ampicillin  2g IV every 8 hours - Will monitor LP results, renal function for dose adjustments  Weight: 49 kg (108 lb 0.4 oz)  Temp (24hrs), Avg:98.9 F (37.2 C), Min:97.7 F (36.5 C), Max:101 F (38.3 C)  Recent Labs  Lab 05/25/24 2221 05/25/24 2222 05/26/24 0044 05/26/24 0455 05/26/24 0536 05/26/24 0816 05/26/24 1403 05/27/24 0155 05/27/24 0834 05/27/24 1027  WBC 9.4  --  11.5*  --   --   --   --  19.4*  --   --   CREATININE 1.75* 1.60* 1.83* 1.45*  --   --   --  1.61*  --   --   LATICACIDVEN  --   --   --   --    < > 1.6 2.9* 3.6* 1.9 2.3*   < > = values in this interval not displayed.    Estimated Creatinine Clearance: 17.2 mL/min (A) (by C-G formula based on SCr of 1.61 mg/dL (H)).    Allergies[1]  Antimicrobials this admission: Zosyn  2/1 >> 2/2 Linezolid  2/1 >> 2/2 Vancomycin  2/2 >> Ampicillin  2/2 >> Rocephin  2/2 >>  Dose adjustments this admission:   Microbiology results: 2/1 Influenza/COVID/RSV >> neg 2/1 RVP >> neg 2/1 BCx >> ng<24h  Thank you for allowing pharmacy to be a part of this patients care.  Almarie Lunger, PharmD, BCPS, BCIDP Infectious Diseases Clinical Pharmacist 05/27/2024 1:38 PM   **Pharmacist phone directory can now be found on [email or web address] ([email or web address]).  Listed under [facility].       [1]  Allergies Allergen Reactions   Ciprofloxacin  Other (See Comments)    Dizziness    Morphine And Codeine Other (See Comments)    Fuzzy feeling

## 2024-05-28 ENCOUNTER — Inpatient Hospital Stay (HOSPITAL_COMMUNITY)

## 2024-05-28 DIAGNOSIS — I69391 Dysphagia following cerebral infarction: Secondary | ICD-10-CM | POA: Diagnosis not present

## 2024-05-28 DIAGNOSIS — G9340 Encephalopathy, unspecified: Secondary | ICD-10-CM | POA: Diagnosis not present

## 2024-05-28 DIAGNOSIS — E785 Hyperlipidemia, unspecified: Secondary | ICD-10-CM | POA: Diagnosis not present

## 2024-05-28 DIAGNOSIS — Z794 Long term (current) use of insulin: Secondary | ICD-10-CM | POA: Diagnosis not present

## 2024-05-28 DIAGNOSIS — Z7982 Long term (current) use of aspirin: Secondary | ICD-10-CM | POA: Diagnosis not present

## 2024-05-28 DIAGNOSIS — E118 Type 2 diabetes mellitus with unspecified complications: Secondary | ICD-10-CM | POA: Diagnosis not present

## 2024-05-28 DIAGNOSIS — E1165 Type 2 diabetes mellitus with hyperglycemia: Secondary | ICD-10-CM | POA: Diagnosis not present

## 2024-05-28 DIAGNOSIS — G030 Nonpyogenic meningitis: Secondary | ICD-10-CM

## 2024-05-28 DIAGNOSIS — E1100 Type 2 diabetes mellitus with hyperosmolarity without nonketotic hyperglycemic-hyperosmolar coma (NKHHC): Secondary | ICD-10-CM | POA: Diagnosis not present

## 2024-05-28 DIAGNOSIS — I6381 Other cerebral infarction due to occlusion or stenosis of small artery: Secondary | ICD-10-CM | POA: Diagnosis not present

## 2024-05-28 DIAGNOSIS — G9341 Metabolic encephalopathy: Secondary | ICD-10-CM | POA: Diagnosis not present

## 2024-05-28 DIAGNOSIS — R569 Unspecified convulsions: Secondary | ICD-10-CM | POA: Diagnosis not present

## 2024-05-28 DIAGNOSIS — D72829 Elevated white blood cell count, unspecified: Secondary | ICD-10-CM | POA: Diagnosis not present

## 2024-05-28 DIAGNOSIS — I10 Essential (primary) hypertension: Secondary | ICD-10-CM | POA: Diagnosis not present

## 2024-05-28 LAB — RENAL FUNCTION PANEL
Albumin: 3.3 g/dL — ABNORMAL LOW (ref 3.5–5.0)
Anion gap: 12 (ref 5–15)
BUN: 23 mg/dL (ref 8–23)
CO2: 25 mmol/L (ref 22–32)
Calcium: 8.9 mg/dL (ref 8.9–10.3)
Chloride: 105 mmol/L (ref 98–111)
Creatinine, Ser: 1.69 mg/dL — ABNORMAL HIGH (ref 0.44–1.00)
GFR, Estimated: 28 mL/min — ABNORMAL LOW
Glucose, Bld: 104 mg/dL — ABNORMAL HIGH (ref 70–99)
Phosphorus: 3.2 mg/dL (ref 2.5–4.6)
Potassium: 2.8 mmol/L — ABNORMAL LOW (ref 3.5–5.1)
Sodium: 142 mmol/L (ref 135–145)

## 2024-05-28 LAB — CBC
HCT: 32.6 % — ABNORMAL LOW (ref 36.0–46.0)
Hemoglobin: 11.6 g/dL — ABNORMAL LOW (ref 12.0–15.0)
MCH: 33.2 pg (ref 26.0–34.0)
MCHC: 35.6 g/dL (ref 30.0–36.0)
MCV: 93.4 fL (ref 80.0–100.0)
Platelets: 208 10*3/uL (ref 150–400)
RBC: 3.49 MIL/uL — ABNORMAL LOW (ref 3.87–5.11)
RDW: 12.8 % (ref 11.5–15.5)
WBC: 17.2 10*3/uL — ABNORMAL HIGH (ref 4.0–10.5)
nRBC: 0.2 % (ref 0.0–0.2)

## 2024-05-28 LAB — GLUCOSE, CAPILLARY
Glucose-Capillary: 97 mg/dL (ref 70–99)
Glucose-Capillary: 99 mg/dL (ref 70–99)
Glucose-Capillary: 84 mg/dL (ref 70–99)
Glucose-Capillary: 64 mg/dL — ABNORMAL LOW (ref 70–99)
Glucose-Capillary: 171 mg/dL — ABNORMAL HIGH (ref 70–99)
Glucose-Capillary: 111 mg/dL — ABNORMAL HIGH (ref 70–99)
Glucose-Capillary: 96 mg/dL (ref 70–99)

## 2024-05-28 LAB — VDRL, CSF: VDRL Quant, CSF: NONREACTIVE

## 2024-05-28 LAB — PATHOLOGIST SMEAR REVIEW

## 2024-05-28 LAB — MAGNESIUM: Magnesium: 1.7 mg/dL (ref 1.7–2.4)

## 2024-05-28 MED ORDER — DEXTROSE 50 % IV SOLN
12.5000 g | INTRAVENOUS | Status: AC
  Administered 2024-05-28: 12.5 g via INTRAVENOUS
  Filled 2024-05-28: qty 50

## 2024-05-28 MED ORDER — POTASSIUM CHLORIDE 10 MEQ/100ML IV SOLN
10.0000 meq | INTRAVENOUS | Status: DC
  Administered 2024-05-28 (×3): 10 meq via INTRAVENOUS
  Filled 2024-05-28 (×6): qty 100

## 2024-05-28 MED ORDER — POTASSIUM CHLORIDE 10 MEQ/100ML IV SOLN
10.0000 meq | INTRAVENOUS | Status: AC
  Administered 2024-05-28 (×3): 10 meq via INTRAVENOUS

## 2024-05-28 MED ORDER — POTASSIUM CHLORIDE 2 MEQ/ML IV SOLN
INTRAVENOUS | Status: DC
  Filled 2024-05-28 (×2): qty 1000

## 2024-05-28 MED ORDER — INSULIN ASPART 100 UNIT/ML IJ SOLN
0.0000 [IU] | INTRAMUSCULAR | Status: DC
  Administered 2024-05-29 – 2024-05-30 (×4): 1 [IU] via SUBCUTANEOUS
  Administered 2024-05-30: 2 [IU] via SUBCUTANEOUS
  Administered 2024-05-30 – 2024-05-31 (×3): 1 [IU] via SUBCUTANEOUS
  Administered 2024-05-31: 3 [IU] via SUBCUTANEOUS
  Filled 2024-05-28 (×2): qty 1
  Filled 2024-05-28: qty 2
  Filled 2024-05-28 (×2): qty 1
  Filled 2024-05-28: qty 3
  Filled 2024-05-28 (×3): qty 1

## 2024-05-28 MED ORDER — MAGNESIUM SULFATE IN D5W 1-5 GM/100ML-% IV SOLN
1.0000 g | Freq: Once | INTRAVENOUS | Status: AC
  Administered 2024-05-28: 1 g via INTRAVENOUS
  Filled 2024-05-28: qty 100

## 2024-05-28 NOTE — Progress Notes (Signed)
 Pt transferred from [phone]

## 2024-05-28 NOTE — Evaluation (Signed)
 Clinical/Bedside Swallow Evaluation Patient Details  Name: [name] MRN: [id number] Date of Birth: 01-19-1930  Today's Date: 05/28/2024 Time: SLP Start Time (ACUTE ONLY): 1538 SLP Stop Time (ACUTE ONLY): 1550 SLP Time Calculation (min) (ACUTE ONLY): 12 min  Past Medical History:  Past Medical History:  Diagnosis Date   Arthritis    Chronic back pain    Cystoid macular edema of right eye 08/19/2019   Diabetic neuropathy (HCC)    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) 11/18/2019   Hypercholesteremia    Hypertension    Liver lesion    Neuropathy    Type II or unspecified type diabetes mellitus without mention of complication, not stated as uncontrolled    Vitamin D  deficiency    Wears glasses    Past Surgical History:  Past Surgical History:  Procedure Laterality Date   ABDOMINAL HYSTERECTOMY     APPENDECTOMY     BACK SURGERY  1993   lumb lam-fusion   BREAST EXCISIONAL BIOPSY Right 1967   benign   CESAREAN SECTION     x 3    COLONOSCOPY     EYE SURGERY     both cataracts   TRIGGER FINGER RELEASE  01/10/2012   Procedure: RELEASE TRIGGER FINGER/A-1 PULLEY;  Surgeon: Avonell KANDICE Herald, MD;  Location: [facility];  Service: Orthopedics;  Laterality: Right;  right ring trigger release   HPI:  [name] is a 89 y.o. female who presented to the hospital on 05/26/24 from [facility] due to stroke and seizure-like activity including shaking and left gaze, right sided weakness. MRI brain concerning for acute lacunar infarct within left posterior frontal corona radiata and left centrum seminal ovale at the frontoparietal junction. CTH without acute finding. Neurology consulted. She was admitted with acute metabolic encephalopathy, seizure like activity. She underwent LP due to possible meningitis. She was kept NPO and SLP ordered for swallow and speech-language cognitive evaluation. PMH: macular degeneration, CVA's,  DM-2, neuropathy, HLD, OA.    Assessment / Plan / Recommendation  Clinical Impression  SLP recommending continue NPO but allow meds and sips of water with meds. SLP will proceed with MBS next date schedule permitting.  Patient presents with clinical s/s of dysphagia as per this bedside swallow evaluation. She exhibited disorganized oral phase, leading to instances of holding of large bolus of liquid in oral cavity and delayed oral transit. Swallow initiation appeared timely. She exhibited delayed coughing with thin liquids. Mastication was prolonged and inefficient, leading to PO residuals in oral cavity (trace amount).   SLP Visit Diagnosis: Dysphagia, unspecified (R13.10)    Aspiration Risk  Mild aspiration risk    Diet Recommendation NPO;NPO except meds    Liquid Administration via: Cup;Straw Medication Administration: Other (Comment) (as tolerated) Supervision: Full supervision/cueing for compensatory strategies;Staff to assist with self feeding Compensations: Slow rate;Small sips/bites Postural Changes: Seated upright at 90 degrees    Other Recommendations Oral Care Recommendations: Oral care BID     Swallow Evaluation Recommendations     Assistance Recommended at Discharge Frequent or constant Supervision/Assistance  Functional Status Assessment Patient has had a recent decline in their functional status and demonstrates the ability to make significant improvements in function in a reasonable and predictable amount of time.  Frequency and Duration min 2x/week          Prognosis Prognosis for improved oropharyngeal function: Good Barriers to Reach Goals: Cognitive deficits      Swallow Study  General HPI: [name] is a 89 y.o. female who presented to the hospital on 05/26/24 from SNF due to stroke and seizure-like activity including shaking and left gaze, right sided weakness. MRI brain concerning for acute lacunar infarct within left posterior frontal corona radiata and  left centrum seminal ovale at the frontoparietal junction. CTH without acute finding. Neurology consulted. She was admitted with acute metabolic encephalopathy, seizure like activity. She underwent LP due to possible meningitis. She was kept NPO and SLP ordered for swallow and speech-language cognitive evaluation. PMH: macular degeneration, CVA's, DM-2, neuropathy, HLD, OA. Type of Study: Bedside Swallow Evaluation Previous Swallow Assessment: none found Diet Prior to this Study: NPO Temperature Spikes Noted: No Respiratory Status: Room air History of Recent Intubation: No Behavior/Cognition: Alert;Cooperative;Pleasant mood Oral Cavity Assessment: Within Functional Limits Oral Care Completed by SLP: No Oral Cavity - Dentition: Adequate natural dentition Self-Feeding Abilities: Total assist Patient Positioning: Upright in bed Baseline Vocal Quality: Normal Volitional Cough: Cognitively unable to elicit Volitional Swallow: Unable to elicit    Oral/Motor/Sensory Function Overall Oral Motor/Sensory Function: Other (comment) (no focal weakness)   Ice Chips     Thin Liquid Thin Liquid: Impaired Presentation: Straw Oral Phase Functional Implications: Other (comment) (disorganized oral phase) Pharyngeal  Phase Impairments: Cough - Delayed    Nectar Thick     Honey Thick     Puree Puree: Impaired Presentation: Spoon Oral Phase Functional Implications: Other (comment) (disorganized oral phase)   Solid     Solid: Impaired Oral Phase Impairments: Impaired mastication Oral Phase Functional Implications: Impaired mastication;Prolonged oral transit     Norleen IVAR Blase, MA, CCC-SLP Speech Therapy  05/28/2024,4:50 PM

## 2024-05-28 NOTE — Progress Notes (Signed)
 LTM maint complete - no skin breakdown under: F7,CZ,GRND

## 2024-05-28 NOTE — Plan of Care (Signed)
 Updated patient's son over the phone.

## 2024-05-28 NOTE — Progress Notes (Signed)
 Hypoglycemic Event  CBG: 64  Treatment: D50 25 mL (12.5 gm)  Symptoms: None  Follow-up CBG: Time:1655 CBG Result:171  Possible Reasons for Event: Inadequate meal intake  Comments/MD notified:MD notified    Trishelle Devora, Peng-Sian

## 2024-05-28 NOTE — Progress Notes (Signed)
 Pt hasn't voided all night. Bladder scan showed 436 ml of urine in the bladder. On-call triad notified. Order received for I&O cath x1. I&O cath implemented with 550 ml of urine output.

## 2024-05-29 ENCOUNTER — Inpatient Hospital Stay (HOSPITAL_COMMUNITY)

## 2024-05-29 DIAGNOSIS — N179 Acute kidney failure, unspecified: Secondary | ICD-10-CM | POA: Diagnosis not present

## 2024-05-29 DIAGNOSIS — I10 Essential (primary) hypertension: Secondary | ICD-10-CM | POA: Diagnosis not present

## 2024-05-29 DIAGNOSIS — G030 Nonpyogenic meningitis: Secondary | ICD-10-CM | POA: Diagnosis not present

## 2024-05-29 DIAGNOSIS — R4182 Altered mental status, unspecified: Secondary | ICD-10-CM | POA: Diagnosis not present

## 2024-05-29 DIAGNOSIS — E1100 Type 2 diabetes mellitus with hyperosmolarity without nonketotic hyperglycemic-hyperosmolar coma (NKHHC): Secondary | ICD-10-CM | POA: Diagnosis not present

## 2024-05-29 DIAGNOSIS — D72829 Elevated white blood cell count, unspecified: Secondary | ICD-10-CM | POA: Diagnosis not present

## 2024-05-29 DIAGNOSIS — N189 Chronic kidney disease, unspecified: Secondary | ICD-10-CM | POA: Diagnosis not present

## 2024-05-29 DIAGNOSIS — R569 Unspecified convulsions: Secondary | ICD-10-CM | POA: Diagnosis not present

## 2024-05-29 DIAGNOSIS — I69391 Dysphagia following cerebral infarction: Secondary | ICD-10-CM | POA: Diagnosis not present

## 2024-05-29 DIAGNOSIS — E118 Type 2 diabetes mellitus with unspecified complications: Secondary | ICD-10-CM | POA: Diagnosis not present

## 2024-05-29 DIAGNOSIS — G9340 Encephalopathy, unspecified: Secondary | ICD-10-CM | POA: Diagnosis not present

## 2024-05-29 DIAGNOSIS — I6381 Other cerebral infarction due to occlusion or stenosis of small artery: Secondary | ICD-10-CM | POA: Diagnosis not present

## 2024-05-29 DIAGNOSIS — E1151 Type 2 diabetes mellitus with diabetic peripheral angiopathy without gangrene: Secondary | ICD-10-CM | POA: Diagnosis not present

## 2024-05-29 DIAGNOSIS — Z794 Long term (current) use of insulin: Secondary | ICD-10-CM | POA: Diagnosis not present

## 2024-05-29 DIAGNOSIS — G9341 Metabolic encephalopathy: Secondary | ICD-10-CM | POA: Diagnosis not present

## 2024-05-29 DIAGNOSIS — E1165 Type 2 diabetes mellitus with hyperglycemia: Secondary | ICD-10-CM | POA: Diagnosis not present

## 2024-05-29 DIAGNOSIS — I639 Cerebral infarction, unspecified: Secondary | ICD-10-CM | POA: Diagnosis not present

## 2024-05-29 DIAGNOSIS — E785 Hyperlipidemia, unspecified: Secondary | ICD-10-CM | POA: Diagnosis not present

## 2024-05-29 DIAGNOSIS — Z515 Encounter for palliative care: Secondary | ICD-10-CM | POA: Diagnosis not present

## 2024-05-29 LAB — MAGNESIUM
Magnesium: 1.5 mg/dL — ABNORMAL LOW (ref 1.7–2.4)
Magnesium: 1.7 mg/dL (ref 1.7–2.4)

## 2024-05-29 LAB — GLUCOSE, CAPILLARY
Glucose-Capillary: 117 mg/dL — ABNORMAL HIGH (ref 70–99)
Glucose-Capillary: 130 mg/dL — ABNORMAL HIGH (ref 70–99)
Glucose-Capillary: 155 mg/dL — ABNORMAL HIGH (ref 70–99)
Glucose-Capillary: 162 mg/dL — ABNORMAL HIGH (ref 70–99)
Glucose-Capillary: 71 mg/dL (ref 70–99)
Glucose-Capillary: 84 mg/dL (ref 70–99)

## 2024-05-29 LAB — CBC
HCT: 31.6 % — ABNORMAL LOW (ref 36.0–46.0)
Hemoglobin: 11 g/dL — ABNORMAL LOW (ref 12.0–15.0)
MCH: 32.8 pg (ref 26.0–34.0)
MCHC: 34.8 g/dL (ref 30.0–36.0)
MCV: 94.3 fL (ref 80.0–100.0)
Platelets: 183 10*3/uL (ref 150–400)
RBC: 3.35 MIL/uL — ABNORMAL LOW (ref 3.87–5.11)
RDW: 12.7 % (ref 11.5–15.5)
WBC: 10.8 10*3/uL — ABNORMAL HIGH (ref 4.0–10.5)
nRBC: 0 % (ref 0.0–0.2)

## 2024-05-29 LAB — ANAEROBIC CULTURE W GRAM STAIN

## 2024-05-29 LAB — RENAL FUNCTION PANEL
Albumin: 3 g/dL — ABNORMAL LOW (ref 3.5–5.0)
Anion gap: 10 (ref 5–15)
BUN: 14 mg/dL (ref 8–23)
CO2: 25 mmol/L (ref 22–32)
Calcium: 8.2 mg/dL — ABNORMAL LOW (ref 8.9–10.3)
Chloride: 106 mmol/L (ref 98–111)
Creatinine, Ser: 1.16 mg/dL — ABNORMAL HIGH (ref 0.44–1.00)
GFR, Estimated: 44 mL/min — ABNORMAL LOW
Glucose, Bld: 85 mg/dL (ref 70–99)
Phosphorus: 2.4 mg/dL — ABNORMAL LOW (ref 2.5–4.6)
Potassium: 3.5 mmol/L (ref 3.5–5.1)
Sodium: 141 mmol/L (ref 135–145)

## 2024-05-29 MED ORDER — METOPROLOL TARTRATE 5 MG/5ML IV SOLN
2.5000 mg | Freq: Four times a day (QID) | INTRAVENOUS | Status: DC
  Administered 2024-05-29: 2.5 mg via INTRAVENOUS
  Filled 2024-05-29: qty 5

## 2024-05-29 MED ORDER — ASPIRIN 300 MG RE SUPP: 300.0000 mg | Freq: Every day | RECTAL | Status: DC

## 2024-05-29 MED ORDER — ORAL CARE MOUTH RINSE: 15.0000 mL | OROMUCOSAL | Status: AC | PRN

## 2024-05-29 MED ORDER — POTASSIUM CHLORIDE 10 MEQ/100ML IV SOLN
10.0000 meq | INTRAVENOUS | Status: AC
  Administered 2024-05-29 (×3): 10 meq via INTRAVENOUS
  Filled 2024-05-29 (×3): qty 100

## 2024-05-29 MED ORDER — VANCOMYCIN HCL 500 MG/100ML IV SOLN: 500.0000 mg | INTRAVENOUS | Status: DC

## 2024-05-29 MED ORDER — ROSUVASTATIN CALCIUM 5 MG PO TABS
10.0000 mg | ORAL_TABLET | Freq: Every morning | ORAL | Status: AC
  Administered 2024-05-30 – 2024-05-31 (×2): 10 mg via ORAL
  Filled 2024-05-29 (×2): qty 2

## 2024-05-29 MED ORDER — KCL-LACTATED RINGERS-D5W 20 MEQ/L IV SOLN
INTRAVENOUS | Status: DC
  Filled 2024-05-29: qty 1000

## 2024-05-29 MED ORDER — ASPIRIN 81 MG PO TBEC: 81.0000 mg | DELAYED_RELEASE_TABLET | Freq: Every day | ORAL | Status: DC

## 2024-05-29 MED ORDER — IOHEXOL 9 MG/ML PO SOLN
500.0000 mL | ORAL | Status: AC
  Administered 2024-05-29: 500 mL via ORAL

## 2024-05-29 MED ORDER — CHLORHEXIDINE GLUCONATE CLOTH 2 % EX PADS: 6.0000 | MEDICATED_PAD | Freq: Every day | CUTANEOUS | Status: DC

## 2024-05-29 MED ORDER — MAGNESIUM SULFATE 2 GM/50ML IV SOLN
2.0000 g | Freq: Once | INTRAVENOUS | Status: AC
  Administered 2024-05-29: 2 g via INTRAVENOUS
  Filled 2024-05-29: qty 50

## 2024-05-29 MED ORDER — SODIUM CHLORIDE 0.9 % IV SOLN: 2.0000 g | INTRAVENOUS | Status: DC

## 2024-05-29 MED ORDER — CLOPIDOGREL BISULFATE 75 MG PO TABS
75.0000 mg | ORAL_TABLET | Freq: Every day | ORAL | Status: AC
  Administered 2024-05-29 – 2024-05-31 (×3): 75 mg via ORAL
  Filled 2024-05-29 (×3): qty 1

## 2024-05-29 MED ORDER — BISOPROLOL FUMARATE 5 MG PO TABS
5.0000 mg | ORAL_TABLET | Freq: Every day | ORAL | Status: DC
  Administered 2024-05-29: 5 mg via ORAL
  Filled 2024-05-29 (×2): qty 1

## 2024-05-29 MED ORDER — LEVETIRACETAM 500 MG PO TABS
500.0000 mg | ORAL_TABLET | Freq: Two times a day (BID) | ORAL | Status: AC
  Administered 2024-05-29 – 2024-05-31 (×5): 500 mg via ORAL
  Filled 2024-05-29 (×5): qty 1

## 2024-05-29 NOTE — Progress Notes (Addendum)
 Patient voided 100cc. Bladder scanned with 400cc. After nurse got a foley out to insert patient voided. Will bladder scan again around 9am.   Patient has order for CT with contrast. However, patient has EEG in progress. CT will contact dayshift nurse on 2/4.

## 2024-05-29 NOTE — Progress Notes (Signed)
 "                                                            [facility] Infectious Diseases Follow Up Note  Patient Identification: Patient Name: [name] MRN: [id number] Admit Date: 05/25/2024 10:16 PM Age: 89 y.o.Today's Date: 05/29/2024  Reason for Visit: Encephalopathy  Principal Problem:   Hyperosmolar hyperglycemic state (HHS) (HCC) Active Problems:   Type 2 diabetes mellitus with complication, with long-term current use of insulin  (HCC)   Acute metabolic encephalopathy   Antibiotics: IV acyclovir  2/2 IV ampicillin  2/2 IV ceftriaxone  2/2- IV vancomycin  2/2- Total days of antibiotics 4  Lines/Hardwares:   Interval Events: Remains afebrile Labs remarkable for creatinine down to 1.16.  WBC down to 10.8, hemoglobin 11 Swallow study done   Assessment 89 year old female with prior history as below including arthritis/chronic back pain, DM2, HTN, HLD, type II DM with neuropathy, vitamin D  deficiency who presented to the ED on 1/31 with right-sided weakness as well as confusion, left gaze.  EMS reported elevated CBG, BP 198/80   # Fevers - resolved # Leukocytosis - improved # Encephalopathy/Aseptic meningitis  - improving - MRI showed acute lacunar infarct in the left posterior frontal corona radiata and MRI brain - Her presentation was consistent with HHS. ? seizure - EEG 2/2 with generalized cerebral dysfunction.  No seizures or definite epileptiform discharges  - 2/2 LP with CSF analysis with WBC 48, neutrophilic, glucose 134, total protein 103, serum glucose 201, negative meningitis/encephalitis panel.  CSF Gram stain/culture NGTD. CSF VDRL NR, Cryptococcal ag negative, Oligoclonal bands pending. Path with neutrophilic inflammation  - clinically improving   # Leukocytosis - improving  # AKI on CKD - improving    Recommendations - continue Vancomycin , pharmacy to dose, will change ceftriaxone  to q daily dosing pending CT results - monitor CBC, CMP and Vancomycin  trough  -  standard isolation precautions  D/w primary team Following   Rest of the management as per the primary team. Thank you for the consult. Please page with pertinent questions or concerns.  ______________________________________________________________________ Subjective patient seen and examined at the bedside. Continued improvement in mental status.   Past Medical History:  Diagnosis Date   Arthritis    Chronic back pain    Cystoid macular edema of right eye 08/19/2019   Diabetic neuropathy (HCC)    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) 11/18/2019   Hypercholesteremia    Hypertension    Liver lesion    Neuropathy    Type II or unspecified type diabetes mellitus without mention of complication, not stated as uncontrolled    Vitamin D  deficiency    Wears glasses    Past Surgical History:  Procedure Laterality Date   ABDOMINAL HYSTERECTOMY     APPENDECTOMY     BACK SURGERY  1993   lumb lam-fusion   BREAST EXCISIONAL BIOPSY Right 1967   benign   CESAREAN SECTION     x 3    COLONOSCOPY     EYE SURGERY     both cataracts   TRIGGER FINGER RELEASE  01/10/2012   Procedure: RELEASE TRIGGER FINGER/A-1 PULLEY;  Surgeon: Eleyna KANDICE Herald, MD;  Location: [facility];  Service: Orthopedics;  Laterality: Right;  right ring trigger  release  '  Vitals BP (!) 148/68 (BP Location: Left Arm)   Pulse 73   Temp 97.9 F (36.6 C) (Oral)   Resp 18   Ht 5' 2 (1.575 m)   Wt 49 kg   SpO2 100%   BMI 19.76 kg/m     Physical Exam Constitutional: Awake, alert, follows some commands, oriented to name, place, some confusion +    Comments: HEENT wnl  Cardiovascular:     Rate and Rhythm: Normal rate    Heart sounds:  Pulmonary:     Effort: Pulmonary effort is normal.     Comments:   Abdominal:     Palpations: Abdomen is non distended     Tenderness:   Musculoskeletal:        General: No swelling or  tenderness.   Skin:    Comments: no rashes  Neurological:     General: moves all extremities   Psychiatric:        Mood and Affect: Mood normal.   Pertinent Microbiology Results for orders placed or performed during the hospital encounter of 05/25/24  Culture, blood (x 2)     Status: None (Preliminary result)   Collection Time: 05/26/24  5:08 AM   Specimen: BLOOD LEFT HAND  Result Value Ref Range Status   Specimen Description BLOOD LEFT HAND  Final   Special Requests   Final    BOTTLES DRAWN AEROBIC ONLY Blood Culture results may not be optimal due to an inadequate volume of blood received in culture bottles   Culture   Final    NO GROWTH 3 DAYS Performed at [facility], [address], [place], [place] [address]    Report Status PENDING  Incomplete  Resp panel by RT-PCR (RSV, Flu A&B, Covid)     Status: None   Collection Time: 05/26/24  5:10 AM   Specimen: Nasal Swab  Result Value Ref Range Status   SARS Coronavirus 2 by RT PCR NEGATIVE NEGATIVE Final   Influenza A by PCR NEGATIVE NEGATIVE Final   Influenza B by PCR NEGATIVE NEGATIVE Final    Comment: (NOTE) The Xpert Xpress SARS-CoV-2/FLU/RSV plus assay is intended as an aid in the diagnosis of influenza from Nasopharyngeal swab specimens and should not be used as a sole basis for treatment. Nasal washings and aspirates are unacceptable for Xpert Xpress SARS-CoV-2/FLU/RSV testing.  Fact Sheet for Patients: bloggercourse.com  Fact Sheet for Healthcare Providers: seriousbroker.it  This test is not yet approved or cleared by the [place]  FDA and has been authorized for detection and/or diagnosis of SARS-CoV-2 by FDA under an Emergency Use Authorization (EUA). This EUA will remain in effect (meaning this test can be used) for the duration of the COVID-19 declaration under Section 564(b)(1) of the Act, 21 U.S.C. section 360bbb-3(b)(1), unless the authorization  is terminated or revoked.     Resp Syncytial Virus by PCR NEGATIVE NEGATIVE Final    Comment: (NOTE) Fact Sheet for Patients: bloggercourse.com  Fact Sheet for Healthcare Providers: seriousbroker.it  This test is not yet approved or cleared by the [place]  FDA and has been authorized for detection and/or diagnosis of SARS-CoV-2 by FDA under an Emergency Use Authorization (EUA). This EUA will remain in effect (meaning this test can be used) for the duration of the COVID-19 declaration under Section 564(b)(1) of the Act, 21 U.S.C. section 360bbb-3(b)(1), unless the authorization is terminated or revoked.  Performed at [facility], [address], [place], [place] [address]  Culture, blood (x 2)     Status: None (Preliminary result)   Collection Time: 05/26/24  5:13 AM   Specimen: BLOOD  Result Value Ref Range Status   Specimen Description BLOOD RIGHT ANTECUBITAL  Final   Special Requests   Final    BOTTLES DRAWN AEROBIC AND ANAEROBIC Blood Culture adequate volume   Culture   Final    NO GROWTH 3 DAYS Performed at [facility], [address] [place], [place] [address]    Report Status PENDING  Incomplete  Respiratory (~20 pathogens) panel by PCR     Status: None   Collection Time: 05/26/24  7:34 AM   Specimen: Nasopharyngeal Swab; Respiratory  Result Value Ref Range Status   Adenovirus NOT DETECTED NOT DETECTED Final   Coronavirus 229E NOT DETECTED NOT DETECTED Final    Comment: (NOTE) The Coronavirus on the Respiratory Panel, DOES NOT test for the novel  Coronavirus (2019 nCoV)    Coronavirus HKU1 NOT DETECTED NOT DETECTED Final   Coronavirus NL63 NOT DETECTED NOT DETECTED Final   Coronavirus OC43 NOT DETECTED NOT DETECTED Final   Metapneumovirus NOT DETECTED NOT DETECTED Final   Rhinovirus / Enterovirus NOT DETECTED NOT DETECTED Final   Influenza A NOT DETECTED NOT DETECTED Final   Influenza B NOT  DETECTED NOT DETECTED Final   Parainfluenza Virus 1 NOT DETECTED NOT DETECTED Final   Parainfluenza Virus 2 NOT DETECTED NOT DETECTED Final   Parainfluenza Virus 3 NOT DETECTED NOT DETECTED Final   Parainfluenza Virus 4 NOT DETECTED NOT DETECTED Final   Respiratory Syncytial Virus NOT DETECTED NOT DETECTED Final   Bordetella pertussis NOT DETECTED NOT DETECTED Final   Bordetella Parapertussis NOT DETECTED NOT DETECTED Final   Chlamydophila pneumoniae NOT DETECTED NOT DETECTED Final   Mycoplasma pneumoniae NOT DETECTED NOT DETECTED Final    Comment: Performed at [facility], [address], [place], [place] [address]  CSF culture w Gram Stain     Status: None (Preliminary result)   Collection Time: 05/27/24  4:10 PM   Specimen: PATH Cytology CSF; Cerebrospinal Fluid  Result Value Ref Range Status   Specimen Description CSF  Final   Special Requests NONE  Final   Gram Stain   Final    WBC PRESENT, PREDOMINANTLY PMN NO ORGANISMS SEEN CYTOSPIN SMEAR    Culture   Final    NO GROWTH 2 DAYS Performed at [facility], [address], [place], [place] [address]    Report Status PENDING  Incomplete  Anaerobic culture w Gram Stain     Status: None (Preliminary result)   Collection Time: 05/27/24  4:40 PM   Specimen: Back; Cerebrospinal Fluid  Result Value Ref Range Status   Specimen Description CSF  Final   Special Requests   Final    NONE Performed at [facility], [address], [place], [place] [address]    Culture   Final    NO ANAEROBES ISOLATED; CULTURE IN PROGRESS FOR 5 DAYS   Report Status PENDING  Incomplete   Pertinent Lab.    Latest Ref Rng & Units 05/29/2024    8:07 AM 05/28/2024    4:03 AM 05/27/2024    1:55 AM  CBC  WBC 4.0 - 10.5 K/uL 10.8  17.2  19.4   Hemoglobin 12.0 - 15.0 g/dL 88.9  88.3  87.1   Hematocrit 36.0 - 46.0 % 31.6  32.6  36.8   Platelets 150 - 400 K/uL  183  208  182       Latest Ref Rng & Units 05/29/2024    8:07 AM 05/28/2024     4:03 AM 05/27/2024    1:55 AM  CMP  Glucose 70 - 99 mg/dL 85  895  798   BUN 8 - 23 mg/dL 14  23  19    Creatinine 0.44 - 1.00 mg/dL 8.83  8.30  8.38   Sodium 135 - 145 mmol/L 141  142  138   Potassium 3.5 - 5.1 mmol/L 3.5  2.8  3.6   Chloride 98 - 111 mmol/L 106  105  100   CO2 22 - 32 mmol/L 25  25  22    Calcium  8.9 - 10.3 mg/dL 8.2  8.9  9.4   Total Protein 6.5 - 8.1 g/dL   7.1   Total Bilirubin 0.0 - 1.2 mg/dL   0.7   Alkaline Phos 38 - 126 U/L   84   AST 15 - 41 U/L   44   ALT 0 - 44 U/L   18      Pertinent Imaging today Plain films and CT images have been personally visualized and interpreted; radiology reports have been reviewed. Decision making incorporated into the Impression.  DG Swallowing Func-Speech Pathology Result Date: 05/29/2024 Table formatting from the original result was not included. Modified Barium Swallow Study Patient Details Name: NATILIE KRABBENHOFT MRN: [id number] Date of Birth: September 25, 1929 Today's Date: 05/29/2024 HPI/PMH: HPI: Mayrene Bastarache is a 89 y.o. female who presented to the hospital on 05/26/24 from SNF due to stroke and seizure-like activity including shaking and left gaze, right sided weakness. MRI brain concerning for acute lacunar infarct within left posterior frontal corona radiata and left centrum seminal ovale at the frontoparietal junction. CTH without acute finding. Neurology consulted. She was admitted with acute metabolic encephalopathy, seizure like activity. She underwent LP due to possible meningitis. She was kept NPO and SLP ordered for swallow and speech-language cognitive evaluation. PMH: macular degeneration, CVA's, DM-2, neuropathy, HLD, OA. Clinical Impression: Clinical Impression: Pt demonstrates no acute swallowing impairment recognized on MBS. Pt has some inattention and restlessness and needs assist to take sips or bites. Reducing distractions is helpful and given one step instruction. Pt had one instance of sensed penetration to the vocal folds  (PAS4) with sensation and ejection when trying to take a whole pill with thin liquids. Mastication slow but complete. Recommend pt resume thin liquids with precautions - assisted feeding, upright position, pills whole in puree. SLP will f/u for tolerance with meals but pt unlikely to need ongoing interventions for dysphagia. Factors that may increase risk of adverse event in presence of aspiration Noe & Lianne 2021): Factors that may increase risk of adverse event in presence of aspiration Noe & Lianne 2021): Reduced cognitive function Recommendations/Plan: Swallowing Evaluation Recommendations Swallowing Evaluation Recommendations Recommendations: PO diet PO Diet Recommendation: Dysphagia 3 (Mechanical soft); Thin liquids (Level 0) Liquid Administration via: Cup; Straw Medication Administration: Whole meds with puree Supervision: Staff to assist with self-feeding Swallowing strategies  : Slow rate; Small bites/sips Postural changes: Position pt fully upright for meals Oral care recommendations: Oral care BID (2x/day) Treatment Plan Treatment Plan Treatment recommendations: Therapy as outlined in treatment plan below Follow-up recommendations: Follow physicians's recommendations for discharge plan and follow up therapies Functional status assessment: Patient has had a recent decline in their functional status and demonstrates the ability to make significant improvements in function in a reasonable and predictable amount of time.  Treatment frequency: Min 1x/week Treatment duration: 1 week Interventions: Aspiration precaution training; Patient/family education Recommendations Recommendations for follow up therapy are one component of a multi-disciplinary discharge planning process, led by the attending physician.  Recommendations may be updated based on patient status, additional functional criteria and insurance authorization. Assessment: Orofacial Exam: Orofacial Exam Oral Cavity - Dentition: Adequate  natural dentition Anatomy: No data recorded Boluses Administered: Boluses Administered Boluses Administered: Thin liquids (Level 0); Mildly thick liquids (Level 2, nectar thick); Moderately thick liquids (Level 3, honey thick); Puree; Solid  Oral Impairment Domain: Oral Impairment Domain Lip Closure: Interlabial escape, no progression to anterior lip Tongue control during bolus hold: Cohesive bolus between tongue to palatal seal Bolus preparation/mastication: Slow prolonged chewing/mashing with complete recollection Bolus transport/lingual motion: Delayed initiation of tongue motion (oral holding) Oral residue: Complete oral clearance Location of oral residue : N/A Initiation of pharyngeal swallow : Pyriform sinuses  Pharyngeal Impairment Domain: Pharyngeal Impairment Domain Laryngeal elevation: Complete superior movement of thyroid  cartilage with complete approximation of arytenoids to epiglottic petiole Anterior hyoid excursion: Complete anterior movement Epiglottic movement: Complete inversion Laryngeal vestibule closure: Complete, no air/contrast in laryngeal vestibule Pharyngeal stripping wave : Present - complete Pharyngoesophageal segment opening: Complete distension and complete duration, no obstruction of flow Tongue base retraction: No contrast between tongue base and posterior pharyngeal wall (PPW)  Esophageal Impairment Domain: No data recorded Pill: No data recorded Penetration/Aspiration Scale Score: Penetration/Aspiration Scale Score 1.  Material does not enter airway: Thin liquids (Level 0); Mildly thick liquids (Level 2, nectar thick); Puree; Moderately thick liquids (Level 3, honey thick); Solid; Pill 4.  Material enters airway, CONTACTS cords then ejected out: Thin liquids (Level 0) Compensatory Strategies: Compensatory Strategies Compensatory strategies: No   General Information: Caregiver present: No  Diet Prior to this Study: NPO   No data recorded  Respiratory Status: WFL   Supplemental O2:  Nasal cannula   History of Recent Intubation: No  Behavior/Cognition: Alert; Cooperative; Pleasant mood No data recorded Baseline vocal quality/speech: Normal Volitional Cough: Able to elicit Volitional Swallow: Unable to elicit Exam Limitations: No limitations Goal Planning: Prognosis for improved oropharyngeal function: Good Barriers to Reach Goals: Cognitive deficits No data recorded Patient/Family Stated Goal: nothing specific Consulted and agree with results and recommendations: Pt unable/family or caregiver not available Pain: Pain Assessment Pain Assessment: Faces Faces Pain Scale: 0 End of Session: Start Time:SLP Start Time (ACUTE ONLY): 0910 Stop Time: SLP Stop Time (ACUTE ONLY): 0930 Time Calculation:SLP Time Calculation (min) (ACUTE ONLY): 20 min Charges: SLP Evaluations $ SLP Speech Visit: 1 Visit SLP Evaluations $BSS Swallow: 1 Procedure $MBS Swallow: 1 Procedure $ SLP EVAL LANGUAGE/SOUND PRODUCTION: 1 Procedure $Swallowing Treatment: 1 Procedure SLP visit diagnosis: SLP Visit Diagnosis: Dysphagia, unspecified (R13.10) Past Medical History: Past Medical History: Diagnosis Date  Arthritis   Chronic back pain   Cystoid macular edema of right eye 08/19/2019  Diabetic neuropathy (HCC)   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) 11/18/2019  Hypercholesteremia   Hypertension   Liver lesion   Neuropathy   Type II or unspecified type diabetes mellitus without mention of complication, not stated as uncontrolled   Vitamin D  deficiency   Wears glasses  Past Surgical History: Past Surgical History: Procedure Laterality Date  ABDOMINAL HYSTERECTOMY    APPENDECTOMY    BACK SURGERY  1993  lumb lam-fusion  BREAST EXCISIONAL BIOPSY Right 1967  benign  CESAREAN SECTION    x 3   COLONOSCOPY  EYE SURGERY    both cataracts  TRIGGER FINGER RELEASE  01/10/2012  Procedure: RELEASE TRIGGER FINGER/A-1 PULLEY;  Surgeon: Irania KANDICE Herald, MD;  Location: [facility];  Service: Orthopedics;  Laterality: Right;  right ring trigger release DeBlois, Consuelo Fitch 05/29/2024, 10:08 AM  I personally spent a total of 50 minutes in the care of the patient today including preparing to see the patient, getting/reviewing separately obtained history, performing a medically appropriate exam/evaluation, placing orders, referring and communicating with other health care professionals, documenting clinical information in the EHR, independently interpreting results, and coordinating care.   Electronically signed by:   Annalee Orem, MD Infectious Disease Physician [facility] Infectious [facility] Pager: [phone]  "

## 2024-05-29 NOTE — Procedures (Addendum)
 Patient Name: [name]  MRN: [id number]  Epilepsy Attending: Arlin MALVA Krebs  Referring Physician/Provider: Voncile Isles, MD  Duration: 05/28/2024 1640 to 05/29/2024 9158   Patient history: 89yo F who presents from her facility with R sided weakness and aphasia with L gaze deviation. EEG to evaluate for seizure   Level of alertness: Awake, asleep   AEDs during EEG study: LEV   Technical aspects: This EEG study was done with scalp electrodes positioned according to the 10-20 International system of electrode placement. Electrical activity was reviewed with band pass filter of 1-70Hz , sensitivity of 7 uV/mm, display speed of 22mm/sec with a 60Hz  notched filter applied as appropriate. EEG data were recorded continuously and digitally stored.  Video monitoring was available and reviewed as appropriate.   Description: The posterior dominant rhythm consists of 7 Hz activity of moderate voltage (25-35 uV) seen predominantly in posterior head regions, symmetric and reactive to eye opening and eye closing. Sleep was characterized by sleep spindles (12 to 14 Hz), maximal frontocentral region. There is continuous generalized and lateralized left hemisphere predominantly 5 to 7 Hz theta slowing admixed with intermittent 2-3hz  delta slowing delta slowing. Hyperventilation and photic stimulation were not performed.      ABNORMALITY - Continuous slow, generalized and lateralized left hemisphere    IMPRESSION: This study is suggestive of cortical dysfunction arising from left hemisphere likely secondary to underlying structural abnormality. Additionally there is generalized cerebral dysfunction (encephalopathy). No seizures or definite epileptiform discharges were seen throughout the recording.   Cassey Hurrell O Rainah Kirshner

## 2024-05-29 NOTE — Progress Notes (Signed)
 " PROGRESS NOTE  GRETA YUNG FMW:[id number] DOB: 24-Mar-1930   PCP: [name], NP  Patient is from: SNF  DOA: 05/25/2024 LOS: 3  Chief complaints Chief Complaint  Patient presents with   Code Stroke     Brief Narrative / Interim history: 89 year old F with PMH of cognitive impairment, CVA, DM-2, neuropathy, HTN, HLD, CKD-3B and osteoarthritis brought to ED from SNF due to stroke and seizure-like activity.  Reportedly had some shaking with left gaze and right-sided weakness.  She presented as code stroke which was ruled out by neurology.  She was admitted with hyperosmolar hyperglycemic state and seizure-like activity.  In ED, SBP elevated to 190s but improved quickly.  CBG > 600.  Serum osmolality 337.  BMP glucose 683.  Na 132.  Bicarb 23.  AG 13.  Hgb 11.3.  EtOH level undetectable.  CT head without acute finding.  VBG reassuring.  Patient was started on insulin  drip and IV fluid and admitted for HHS and acute metabolic encephalopathy.  Neurology consulted and MRI brain ordered.  MRI brain concerning for acute lacunar infarct within left posterior frontal corona radiata and left centrum seminal ovale at the frontoparietal junction.  Patient continued to spike fever with leukocytosis and persistent somnolence.  ID consulted recommended LP to rule out meningitis.  They also adjusted antibiotics for possible meningitis.  Palliative medicine consulted as well.  Patient underwent fluoroscopy guided LP.  CSF fluid with WBC 48, neutrophilic, glucose 134, total protein 103, serum glucose 201, negative meningitis/encephalitis panel and cultures.  Ampicillin  and Valtrex discontinued.  Remains on ceftriaxone  and vancomycin .  Mental status improved.  ID, neurology and palliative following.   Subjective: Seen and examined earlier this morning.  No major events overnight of this morning.  No specific complaints but report hurting all over.  She is awake and alert and oriented to self, hospital and  state.  Follows some commands.  Assessment and plan: Poorly controlled IDDM-2 with hyperosmolar hyperglycemic state and neuropathy:  Due to noncompliance?  A1c 6% (7.2% in 03/2024).  Supposed to be on Lantus  8 units at bedtime. Serum osmolality elevated to 337.  Hyperglycemia resolved. Recent Labs  Lab 05/28/24 2029 05/28/24 2309 05/29/24 0455 05/29/24 0738 05/29/24 1103  GLUCAP 111* 96 84 71 117*  -Continue SSI-very sensitive  Acute metabolic encephalopathy: Multifactorial including HHS, infection (possible meningitis), CVA (MRI positive for acute lacunar CVA), delirium and possible seizure.  Reportedly had shaking and left gaze when she presented to ED.  CT head and CT angio head and neck without acute finding.  Does not seem to have focal neurodeficit but limited exam due to encephalopathy.  VBG reassuring.  RVP, UA, TSH, ammonia and B12 unrevealing.  LTM EEG negative for seizure or epileptiform discharge.  CSF fluid as above.  Mental status improved.  She is awake, oriented to self, hospital, state and follows some commands. -Appreciate help by ID-de-escalated antibiotics to ceftriaxone  and vancomycin . -Continue Keppra  per neurology. -Glycemic control as above. -Blood pressure control -Started dysphagia 3 diet after MBS. -Aspiration precaution -Follow further CSF studies including VDRL, oligoclonal bands and cultures.  Acute lacunar infarct/history of CVA: Likely ischemic. MRI brain concerning for acute lacunar infarct within left posterior frontal corona radiata and left centrum seminal ovale at the frontoparietal junction.  CT angio head and neck without LVO.  TTE without significant finding.  LDL 35.  A1c 10.6. - Continue aspirin  p.o. or rectal. - Continue PT/OT/SLP  Seizure-like activity: Reportedly had some shaking and left  gaze on presentation.  A.m. EEG without definite seizure or epileptiform discharge.  - Keppra  per neurology.  SIRS: Patient with significant fever and  mild leukocytosis.  Unclear source of infection.  CSF analysis does not suggest infection.  UA and CXR unrevealing.  Lactic acid resolved.  Fever resolved.  Leukocytosis resolving.  Mental status improved. - Antibiotics per ID - Follow CT chest, abdomen and pelvis  Paroxysmal atrial fibrillation: Intermittent A-fib on telemetry.  No history of this.  Currently rate controlled without meds. - Continue telemetry monitoring - Hold off anticoagulation  Uncontrolled hypertension: Blood pressure improved.  On Zebeta  at home. - Discontinue IV fluid - Resume home bisoprolol . - IV labetalol  as needed   AKI on CKD-3B: Unclear baseline but Cr improved to 1.16.  Relatively stable. - Continue monitoring - Avoid nephrotoxic meds  History of cognitive impairment: History of memory loss and a problem list. -See encephalopathy for management  Hypokalemia/hypomagnesemia -Monitor replenish K and Mg as appropriate   Anemia of chronic disease: Hgb at baseline. -Continue monitoring  Hyperlipidemia - Continue statin when able to take p.o. safely.  Leukocytosis/bandemia: Resolving. -See SIRS above  Nutrition Body mass index is 19.76 kg/m.           DVT prophylaxis:  enoxaparin  (LOVENOX ) injection 30 mg Start: 05/26/24 1000  Code Status: DNR Family Communication: Updated patient's son over the phone  Level of care: Progressive Status is: Inpatient Remains inpatient appropriate because: Glycemic crisis, encephalopathy, seizure-like activity, SIRS and possible meningitis   Final disposition: Yet to be determined   55 minutes with more than 50% spent in reviewing records, counseling patient/family and coordinating care.  Consultants:  Neurology Infectious disease Palliative medicine  Procedures: Lumbar puncture  Microbiology summarized: COVID-19, influenza and RSV PCR nonreactive A-20 pathogen RVP nonreactive Blood culture NGTD CSF culture NGTD  Objective: Vitals:    05/29/24 0756 05/29/24 0834 05/29/24 1031 05/29/24 1204  BP: (!) 189/77 (!) 179/75 (!) 193/69 (!) 148/68  Pulse: 77   73  Resp: 18     Temp: 97.8 F (36.6 C)   97.9 F (36.6 C)  TempSrc: Oral   Oral  SpO2: 98%   100%  Weight:        Examination:  GENERAL: Appears frail.  No apparent distress. HEENT: MMM.  Vision and hearing grossly intact.  NECK: Supple.  No apparent JVD.  RESP:  No IWOB.  Fair aeration bilaterally. CVS:  RRR. Heart sounds normal.  ABD/GI/GU: BS+. Abd soft, NTND.  MSK/EXT:  Moves extremities.  Significant muscle mass and subcu fat loss.  Mittens bilaterally. SKIN: no apparent skin lesion or wound NEURO: Awake and alert.  Oriented to self, hospital, state, and follow some commands.  Clear speech.  No facial asymmetry.  PERRL.  No apparent focal neuro deficit but limited exam. PSYCH: Calm.  No distress or agitation.  Sch Meds:  Scheduled Meds:  aspirin   300 mg Rectal Daily   Or   aspirin   325 mg Oral Daily   Chlorhexidine  Gluconate Cloth  6 each Topical Daily   enoxaparin  (LOVENOX ) injection  30 mg Subcutaneous Q24H   insulin  aspart  0-6 Units Subcutaneous Q4H   iohexol   500 mL Oral Q1H   levETIRAcetam   500 mg Intravenous BID   metoprolol  tartrate  2.5 mg Intravenous Q6H   Continuous Infusions:  cefTRIAXone  (ROCEPHIN )  IV 2 g (05/29/24 1147)   dextrose  5% lactated ringers  with KCl 20 mEq/L 40 mL/hr at 05/29/24 1142   potassium chloride   10 mEq (05/29/24 1204)   [START ON 05/30/2024] vancomycin      PRN Meds:.dextrose , labetalol , LORazepam   Antimicrobials: Anti-infectives (From admission, onward)    Start     Dose/Rate Route Frequency Ordered Stop   05/30/24 2200  vancomycin  (VANCOREADY) IVPB 500 mg/100 mL        500 mg 100 mL/hr over 60 Minutes Intravenous Every 48 hours 05/29/24 0808     05/28/24 2200  vancomycin  (VANCOREADY) IVPB 500 mg/100 mL  Status:  Discontinued        500 mg 100 mL/hr over 60 Minutes Intravenous Every 24 hours 05/27/24 1336  05/29/24 0808   05/27/24 1800  cefTRIAXone  (ROCEPHIN ) 2 g in sodium chloride  0.9 % 100 mL IVPB        2 g 200 mL/hr over 30 Minutes Intravenous Every 12 hours 05/27/24 1326     05/27/24 1500  acyclovir  (ZOVIRAX ) 500 mg in dextrose  5 % 100 mL IVPB  Status:  Discontinued        500 mg 110 mL/hr over 60 Minutes Intravenous Every 24 hours 05/27/24 1412 05/28/24 0905   05/27/24 1430  vancomycin  (VANCOCIN ) IVPB 1000 mg/200 mL premix        1,000 mg 200 mL/hr over 60 Minutes Intravenous  Once 05/27/24 1336 05/27/24 1456   05/27/24 1415  ampicillin  (OMNIPEN) 2 g in sodium chloride  0.9 % 100 mL IVPB  Status:  Discontinued        2 g 300 mL/hr over 20 Minutes Intravenous Every 8 hours 05/27/24 1326 05/28/24 0905   05/26/24 1000  linezolid  (ZYVOX ) IVPB 600 mg  Status:  Discontinued        600 mg 300 mL/hr over 60 Minutes Intravenous Every 12 hours 05/26/24 0737 05/27/24 1326   05/26/24 0830  piperacillin -tazobactam (ZOSYN ) IVPB 2.25 g  Status:  Discontinued        2.25 g 100 mL/hr over 30 Minutes Intravenous Every 8 hours 05/26/24 0820 05/27/24 1326        I have personally reviewed the following labs and images: CBC: Recent Labs  Lab 05/25/24 2221 05/25/24 2222 05/26/24 0044 05/26/24 0816 05/27/24 0155 05/28/24 0403 05/29/24 0807  WBC 9.4  --  11.5*  --  19.4* 17.2* 10.8*  NEUTROABS 5.8  --   --   --   --   --   --   HGB 11.3*   < > 11.5* 10.9* 12.8 11.6* 11.0*  HCT 33.0*   < > 34.2* 32.0* 36.8 32.6* 31.6*  MCV 97.9  --  97.4  --  94.8 93.4 94.3  PLT 265  --  224  --  182 208 183   < > = values in this interval not displayed.   BMP &GFR Recent Labs  Lab 05/26/24 0044 05/26/24 0455 05/26/24 0816 05/27/24 0155 05/28/24 0403 05/29/24 0130 05/29/24 0807  NA 139 140 141 138 142  --  141  K 4.1 3.4* 3.4* 3.6 2.8*  --  3.5  CL 100 103  --  100 105  --  106  CO2 24 24  --  22 25  --  25  GLUCOSE 412* 182*  --  201* 104*  --  85  BUN 25* 22  --  19 23  --  14  CREATININE 1.83*  1.45*  --  1.61* 1.69*  --  1.16*  CALCIUM  9.4 9.5  --  9.4 8.9  --  8.2*  MG  --   --   --  1.7 1.7 1.5* 1.7  PHOS  --   --   --  2.0* 3.2  --  2.4*   Estimated Creatinine Clearance: 23.9 mL/min (A) (by C-G formula based on SCr of 1.16 mg/dL (H)). Liver & Pancreas: Recent Labs  Lab 05/25/24 2221 05/27/24 0155 05/28/24 0403 05/29/24 0807  AST 38 44*  --   --   ALT 22 18  --   --   ALKPHOS 150* 84  --   --   BILITOT 0.3 0.7  --   --   PROT 7.1 7.1  --   --   ALBUMIN 3.8 3.8 3.3* 3.0*   No results for input(s): LIPASE, AMYLASE in the last 168 hours. Recent Labs  Lab 05/26/24 0805  AMMONIA 19   Diabetic: Recent Labs    05/27/24 0834  HGBA1C 10.6*   Recent Labs  Lab 05/28/24 2029 05/28/24 2309 05/29/24 0455 05/29/24 0738 05/29/24 1103  GLUCAP 111* 96 84 71 117*   Cardiac Enzymes: Recent Labs  Lab 05/26/24 1403 05/27/24 0155  CKTOTAL 222 505*   No results for input(s): PROBNP in the last 8760 hours. Coagulation Profile: Recent Labs  Lab 05/25/24 2221  INR 1.0   Thyroid  Function Tests: No results for input(s): TSH, T4TOTAL, FREET4, T3FREE, THYROIDAB in the last 72 hours.  Lipid Profile: Recent Labs    05/27/24 0155  CHOL 112  HDL 48  LDLCALC 35  TRIG 142  CHOLHDL 2.3   Anemia Panel: No results for input(s): VITAMINB12, FOLATE, FERRITIN, TIBC, IRON, RETICCTPCT in the last 72 hours.  Urine analysis:    Component Value Date/Time   COLORURINE STRAW (A) 05/26/2024 0542   APPEARANCEUR CLEAR 05/26/2024 0542   LABSPEC 1.027 05/26/2024 0542   PHURINE 6.0 05/26/2024 0542   GLUCOSEU >=500 (A) 05/26/2024 0542   HGBUR NEGATIVE 05/26/2024 0542   BILIRUBINUR NEGATIVE 05/26/2024 0542   KETONESUR NEGATIVE 05/26/2024 0542   PROTEINUR 100 (A) 05/26/2024 0542   UROBILINOGEN 0.2 07/29/2012 1237   NITRITE NEGATIVE 05/26/2024 0542   LEUKOCYTESUR NEGATIVE 05/26/2024 0542   Sepsis Labs: Invalid input(s): PROCALCITONIN,  LACTICIDVEN  Microbiology: Recent Results (from the past 240 hours)  Culture, blood (x 2)     Status: None (Preliminary result)   Collection Time: 05/26/24  5:08 AM   Specimen: BLOOD LEFT HAND  Result Value Ref Range Status   Specimen Description BLOOD LEFT HAND  Final   Special Requests   Final    BOTTLES DRAWN AEROBIC ONLY Blood Culture results may not be optimal due to an inadequate volume of blood received in culture bottles   Culture   Final    NO GROWTH 3 DAYS Performed at [facility], [address] [place], [place] [address]    Report Status PENDING  Incomplete  Resp panel by RT-PCR (RSV, Flu A&B, Covid)     Status: None   Collection Time: 05/26/24  5:10 AM   Specimen: Nasal Swab  Result Value Ref Range Status   SARS Coronavirus 2 by RT PCR NEGATIVE NEGATIVE Final   Influenza A by PCR NEGATIVE NEGATIVE Final   Influenza B by PCR NEGATIVE NEGATIVE Final    Comment: (NOTE) The Xpert Xpress SARS-CoV-2/FLU/RSV plus assay is intended as an aid in the diagnosis of influenza from Nasopharyngeal swab specimens and should not be used as a sole basis for treatment. Nasal washings and aspirates are unacceptable for Xpert Xpress SARS-CoV-2/FLU/RSV testing.  Fact Sheet for Patients: bloggercourse.com  Fact Sheet for  Healthcare Providers: seriousbroker.it  This test is not yet approved or cleared by the [place]  FDA and has been authorized for detection and/or diagnosis of SARS-CoV-2 by FDA under an Emergency Use Authorization (EUA). This EUA will remain in effect (meaning this test can be used) for the duration of the COVID-19 declaration under Section 564(b)(1) of the Act, 21 U.S.C. section 360bbb-3(b)(1), unless the authorization is terminated or revoked.     Resp Syncytial Virus by PCR NEGATIVE NEGATIVE Final    Comment: (NOTE) Fact Sheet for Patients: bloggercourse.com  Fact Sheet  for Healthcare Providers: seriousbroker.it  This test is not yet approved or cleared by the [place]  FDA and has been authorized for detection and/or diagnosis of SARS-CoV-2 by FDA under an Emergency Use Authorization (EUA). This EUA will remain in effect (meaning this test can be used) for the duration of the COVID-19 declaration under Section 564(b)(1) of the Act, 21 U.S.C. section 360bbb-3(b)(1), unless the authorization is terminated or revoked.  Performed at [facility], [address], [place], [place] [address]   Culture, blood (x 2)     Status: None (Preliminary result)   Collection Time: 05/26/24  5:13 AM   Specimen: BLOOD  Result Value Ref Range Status   Specimen Description BLOOD RIGHT ANTECUBITAL  Final   Special Requests   Final    BOTTLES DRAWN AEROBIC AND ANAEROBIC Blood Culture adequate volume   Culture   Final    NO GROWTH 3 DAYS Performed at [facility], [address] [place], [place] [address]    Report Status PENDING  Incomplete  Respiratory (~20 pathogens) panel by PCR     Status: None   Collection Time: 05/26/24  7:34 AM   Specimen: Nasopharyngeal Swab; Respiratory  Result Value Ref Range Status   Adenovirus NOT DETECTED NOT DETECTED Final   Coronavirus 229E NOT DETECTED NOT DETECTED Final    Comment: (NOTE) The Coronavirus on the Respiratory Panel, DOES NOT test for the novel  Coronavirus (2019 nCoV)    Coronavirus HKU1 NOT DETECTED NOT DETECTED Final   Coronavirus NL63 NOT DETECTED NOT DETECTED Final   Coronavirus OC43 NOT DETECTED NOT DETECTED Final   Metapneumovirus NOT DETECTED NOT DETECTED Final   Rhinovirus / Enterovirus NOT DETECTED NOT DETECTED Final   Influenza A NOT DETECTED NOT DETECTED Final   Influenza B NOT DETECTED NOT DETECTED Final   Parainfluenza Virus 1 NOT DETECTED NOT DETECTED Final   Parainfluenza Virus 2 NOT DETECTED NOT DETECTED Final   Parainfluenza Virus 3 NOT DETECTED NOT  DETECTED Final   Parainfluenza Virus 4 NOT DETECTED NOT DETECTED Final   Respiratory Syncytial Virus NOT DETECTED NOT DETECTED Final   Bordetella pertussis NOT DETECTED NOT DETECTED Final   Bordetella Parapertussis NOT DETECTED NOT DETECTED Final   Chlamydophila pneumoniae NOT DETECTED NOT DETECTED Final   Mycoplasma pneumoniae NOT DETECTED NOT DETECTED Final    Comment: Performed at [facility], [address] [place], [place] [address]  CSF culture w Gram Stain     Status: None (Preliminary result)   Collection Time: 05/27/24  4:10 PM   Specimen: PATH Cytology CSF; Cerebrospinal Fluid  Result Value Ref Range Status   Specimen Description CSF  Final   Special Requests NONE  Final   Gram Stain   Final    WBC PRESENT, PREDOMINANTLY PMN NO ORGANISMS SEEN CYTOSPIN SMEAR    Culture   Final    NO GROWTH 2 DAYS Performed  at [facility], [address], [place], [place] [address]    Report Status PENDING  Incomplete    Radiology Studies: DG Swallowing Func-Speech Pathology Result Date: 05/29/2024 Table formatting from the original result was not included. Modified Barium Swallow Study Patient Details Name: HAMNA ASA MRN: [id number] Date of Birth: [birth year] Today's Date: 05/29/2024 HPI/PMH: HPI: [name] is a 89 y.o. female who presented to the hospital on 05/26/24 from SNF due to stroke and seizure-like activity including shaking and left gaze, right sided weakness. MRI brain concerning for acute lacunar infarct within left posterior frontal corona radiata and left centrum seminal ovale at the frontoparietal junction. CTH without acute finding. Neurology consulted. She was admitted with acute metabolic encephalopathy, seizure like activity. She underwent LP due to possible meningitis. She was kept NPO and SLP ordered for swallow and speech-language cognitive evaluation. PMH: macular degeneration, CVA's, DM-2, neuropathy, HLD, OA. Clinical Impression: Clinical Impression: Pt  demonstrates no acute swallowing impairment recognized on MBS. Pt has some inattention and restlessness and needs assist to take sips or bites. Reducing distractions is helpful and given one step instruction. Pt had one instance of sensed penetration to the vocal folds (PAS4) with sensation and ejection when trying to take a whole pill with thin liquids. Mastication slow but complete. Recommend pt resume thin liquids with precautions - assisted feeding, upright position, pills whole in puree. SLP will f/u for tolerance with meals but pt unlikely to need ongoing interventions for dysphagia. Factors that may increase risk of adverse event in presence of aspiration Noe & Lianne 2021): Factors that may increase risk of adverse event in presence of aspiration Noe & Lianne 2021): Reduced cognitive function Recommendations/Plan: Swallowing Evaluation Recommendations Swallowing Evaluation Recommendations Recommendations: PO diet PO Diet Recommendation: Dysphagia 3 (Mechanical soft); Thin liquids (Level 0) Liquid Administration via: Cup; Straw Medication Administration: Whole meds with puree Supervision: Staff to assist with self-feeding Swallowing strategies  : Slow rate; Small bites/sips Postural changes: Position pt fully upright for meals Oral care recommendations: Oral care BID (2x/day) Treatment Plan Treatment Plan Treatment recommendations: Therapy as outlined in treatment plan below Follow-up recommendations: Follow physicians's recommendations for discharge plan and follow up therapies Functional status assessment: Patient has had a recent decline in their functional status and demonstrates the ability to make significant improvements in function in a reasonable and predictable amount of time. Treatment frequency: Min 1x/week Treatment duration: 1 week Interventions: Aspiration precaution training; Patient/family education Recommendations Recommendations for follow up therapy are one component of a  multi-disciplinary discharge planning process, led by the attending physician.  Recommendations may be updated based on patient status, additional functional criteria and insurance authorization. Assessment: Orofacial Exam: Orofacial Exam Oral Cavity - Dentition: Adequate natural dentition Anatomy: No data recorded Boluses Administered: Boluses Administered Boluses Administered: Thin liquids (Level 0); Mildly thick liquids (Level 2, nectar thick); Moderately thick liquids (Level 3, honey thick); Puree; Solid  Oral Impairment Domain: Oral Impairment Domain Lip Closure: Interlabial escape, no progression to anterior lip Tongue control during bolus hold: Cohesive bolus between tongue to palatal seal Bolus preparation/mastication: Slow prolonged chewing/mashing with complete recollection Bolus transport/lingual motion: Delayed initiation of tongue motion (oral holding) Oral residue: Complete oral clearance Location of oral residue : N/A Initiation of pharyngeal swallow : Pyriform sinuses  Pharyngeal Impairment Domain: Pharyngeal Impairment Domain Laryngeal elevation: Complete superior movement of thyroid  cartilage with complete approximation of arytenoids to epiglottic petiole Anterior hyoid excursion: Complete anterior movement Epiglottic movement: Complete inversion Laryngeal vestibule  closure: Complete, no air/contrast in laryngeal vestibule Pharyngeal stripping wave : Present - complete Pharyngoesophageal segment opening: Complete distension and complete duration, no obstruction of flow Tongue base retraction: No contrast between tongue base and posterior pharyngeal wall (PPW)  Esophageal Impairment Domain: No data recorded Pill: No data recorded Penetration/Aspiration Scale Score: Penetration/Aspiration Scale Score 1.  Material does not enter airway: Thin liquids (Level 0); Mildly thick liquids (Level 2, nectar thick); Puree; Moderately thick liquids (Level 3, honey thick); Solid; Pill 4.  Material enters airway,  CONTACTS cords then ejected out: Thin liquids (Level 0) Compensatory Strategies: Compensatory Strategies Compensatory strategies: No   General Information: Caregiver present: No  Diet Prior to this Study: NPO   No data recorded  Respiratory Status: WFL   Supplemental O2: Nasal cannula   History of Recent Intubation: No  Behavior/Cognition: Alert; Cooperative; Pleasant mood No data recorded Baseline vocal quality/speech: Normal Volitional Cough: Able to elicit Volitional Swallow: Unable to elicit Exam Limitations: No limitations Goal Planning: Prognosis for improved oropharyngeal function: Good Barriers to Reach Goals: Cognitive deficits No data recorded Patient/Family Stated Goal: nothing specific Consulted and agree with results and recommendations: Pt unable/family or caregiver not available Pain: Pain Assessment Pain Assessment: Faces Faces Pain Scale: 0 End of Session: Start Time:SLP Start Time (ACUTE ONLY): 0910 Stop Time: SLP Stop Time (ACUTE ONLY): 0930 Time Calculation:SLP Time Calculation (min) (ACUTE ONLY): 20 min Charges: SLP Evaluations $ SLP Speech Visit: 1 Visit SLP Evaluations $BSS Swallow: 1 Procedure $MBS Swallow: 1 Procedure $ SLP EVAL LANGUAGE/SOUND PRODUCTION: 1 Procedure $Swallowing Treatment: 1 Procedure SLP visit diagnosis: SLP Visit Diagnosis: Dysphagia, unspecified (R13.10) Past Medical History: Past Medical History: Diagnosis Date  Arthritis   Chronic back pain   Cystoid macular edema of right eye 08/19/2019  Diabetic neuropathy (HCC)   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) 11/18/2019  Hypercholesteremia   Hypertension   Liver lesion   Neuropathy   Type II or unspecified type diabetes mellitus without mention of complication, not stated as uncontrolled   Vitamin D  deficiency   Wears glasses  Past Surgical History: Past Surgical History: Procedure Laterality Date  ABDOMINAL HYSTERECTOMY    APPENDECTOMY    BACK SURGERY  1993   lumb lam-fusion  BREAST EXCISIONAL BIOPSY Right 1967  benign  CESAREAN SECTION    x 3   COLONOSCOPY    EYE SURGERY    both cataracts  TRIGGER FINGER RELEASE  01/10/2012  Procedure: RELEASE TRIGGER FINGER/A-1 PULLEY;  Surgeon: Lahela KANDICE Herald, MD;  Location: [facility];  Service: Orthopedics;  Laterality: Right;  right ring trigger release DeBlois, Consuelo Fitch 05/29/2024, 10:08 AM     Dymir Neeson T. Mathius Birkeland [facility]  If 7PM-7AM, please contact night-coverage [email or web address] 05/29/2024, 12:52 PM   "

## 2024-05-29 NOTE — Progress Notes (Signed)
 Transported to radiology, in bed, for swallow.

## 2024-05-29 NOTE — Progress Notes (Signed)
 LTM EEG D/C'd. No noted skin break down. [facility] was notified.

## 2024-05-29 NOTE — Progress Notes (Addendum)
 STROKE TEAM PROGRESS NOTE   INTERIM HISTORY/SUBJECTIVE Patient is seen in her room with no family at the bedside.  She has been hemodynamically stable and afebrile overnight.  She is alert and oriented to self only but conversant.  LTM EEG demonstrates no seizure activity and only slowing, so we will discontinue it today.  OBJECTIVE  CBC    Component Value Date/Time   WBC 10.8 (H) 05/29/2024 0807   RBC 3.35 (L) 05/29/2024 0807   HGB 11.0 (L) 05/29/2024 0807   HCT 31.6 (L) 05/29/2024 0807   PLT 183 05/29/2024 0807   MCV 94.3 05/29/2024 0807   MCH 32.8 05/29/2024 0807   MCHC 34.8 05/29/2024 0807   RDW 12.7 05/29/2024 0807   LYMPHSABS 2.5 05/25/2024 2221   MONOABS 0.9 05/25/2024 2221   EOSABS 0.1 05/25/2024 2221   BASOSABS 0.1 05/25/2024 2221    BMET    Component Value Date/Time   NA 141 05/29/2024 0807   K 3.5 05/29/2024 0807   CL 106 05/29/2024 0807   CO2 25 05/29/2024 0807   GLUCOSE 85 05/29/2024 0807   BUN 14 05/29/2024 0807   CREATININE 1.16 (H) 05/29/2024 0807   CREATININE 1.80 (H) 04/24/2023 0944   CALCIUM  8.2 (L) 05/29/2024 0807   EGFR 26 (L) 04/24/2023 0944   GFRNONAA 44 (L) 05/29/2024 0807   GFRNONAA 35 (L) 05/21/2020 1115    IMAGING past 24 hours DG Swallowing Func-Speech Pathology Result Date: 05/29/2024 Table formatting from the original result was not included. Modified Barium Swallow Study Patient Details Name: [name] MRN: [id number] Date of Birth: 1929/10/24 Today's Date: 05/29/2024 HPI/PMH: HPI: [name] is a 89 y.o. female who presented to the hospital on 05/26/24 from SNF due to stroke and seizure-like activity including shaking and left gaze, right sided weakness. MRI brain concerning for acute lacunar infarct within left posterior frontal corona radiata and left centrum seminal ovale at the frontoparietal junction. CTH without acute finding. Neurology consulted. She was admitted with acute metabolic encephalopathy, seizure like activity. She  underwent LP due to possible meningitis. She was kept NPO and SLP ordered for swallow and speech-language cognitive evaluation. PMH: macular degeneration, CVA's, DM-2, neuropathy, HLD, OA. Clinical Impression: Clinical Impression: Pt demonstrates no acute swallowing impairment recognized on MBS. Pt has some inattention and restlessness and needs assist to take sips or bites. Reducing distractions is helpful and given one step instruction. Pt had one instance of sensed penetration to the vocal folds (PAS4) with sensation and ejection when trying to take a whole pill with thin liquids. Mastication slow but complete. Recommend pt resume thin liquids with precautions - assisted feeding, upright position, pills whole in puree. SLP will f/u for tolerance with meals but pt unlikely to need ongoing interventions for dysphagia. Factors that may increase risk of adverse event in presence of aspiration Noe & Lianne 2021): Factors that may increase risk of adverse event in presence of aspiration Noe & Lianne 2021): Reduced cognitive function Recommendations/Plan: Swallowing Evaluation Recommendations Swallowing Evaluation Recommendations Recommendations: PO diet PO Diet Recommendation: Dysphagia 3 (Mechanical soft); Thin liquids (Level 0) Liquid Administration via: Cup; Straw Medication Administration: Whole meds with puree Supervision: Staff to assist with self-feeding Swallowing strategies  : Slow rate; Small bites/sips Postural changes: Position pt fully upright for meals Oral care recommendations: Oral care BID (2x/day) Treatment Plan Treatment Plan Treatment recommendations: Therapy as outlined in treatment plan below Follow-up recommendations: Follow physicians's recommendations for discharge plan and follow up therapies Functional status assessment: Patient  has had a recent decline in their functional status and demonstrates the ability to make significant improvements in function in a reasonable and  predictable amount of time. Treatment frequency: Min 1x/week Treatment duration: 1 week Interventions: Aspiration precaution training; Patient/family education Recommendations Recommendations for follow up therapy are one component of a multi-disciplinary discharge planning process, led by the attending physician.  Recommendations may be updated based on patient status, additional functional criteria and insurance authorization. Assessment: Orofacial Exam: Orofacial Exam Oral Cavity - Dentition: Adequate natural dentition Anatomy: No data recorded Boluses Administered: Boluses Administered Boluses Administered: Thin liquids (Level 0); Mildly thick liquids (Level 2, nectar thick); Moderately thick liquids (Level 3, honey thick); Puree; Solid  Oral Impairment Domain: Oral Impairment Domain Lip Closure: Interlabial escape, no progression to anterior lip Tongue control during bolus hold: Cohesive bolus between tongue to palatal seal Bolus preparation/mastication: Slow prolonged chewing/mashing with complete recollection Bolus transport/lingual motion: Delayed initiation of tongue motion (oral holding) Oral residue: Complete oral clearance Location of oral residue : N/A Initiation of pharyngeal swallow : Pyriform sinuses  Pharyngeal Impairment Domain: Pharyngeal Impairment Domain Laryngeal elevation: Complete superior movement of thyroid  cartilage with complete approximation of arytenoids to epiglottic petiole Anterior hyoid excursion: Complete anterior movement Epiglottic movement: Complete inversion Laryngeal vestibule closure: Complete, no air/contrast in laryngeal vestibule Pharyngeal stripping wave : Present - complete Pharyngoesophageal segment opening: Complete distension and complete duration, no obstruction of flow Tongue base retraction: No contrast between tongue base and posterior pharyngeal wall (PPW)  Esophageal Impairment Domain: No data recorded Pill: No data recorded Penetration/Aspiration Scale Score:  Penetration/Aspiration Scale Score 1.  Material does not enter airway: Thin liquids (Level 0); Mildly thick liquids (Level 2, nectar thick); Puree; Moderately thick liquids (Level 3, honey thick); Solid; Pill 4.  Material enters airway, CONTACTS cords then ejected out: Thin liquids (Level 0) Compensatory Strategies: Compensatory Strategies Compensatory strategies: No   General Information: Caregiver present: No  Diet Prior to this Study: NPO   No data recorded  Respiratory Status: WFL   Supplemental O2: Nasal cannula   History of Recent Intubation: No  Behavior/Cognition: Alert; Cooperative; Pleasant mood No data recorded Baseline vocal quality/speech: Normal Volitional Cough: Able to elicit Volitional Swallow: Unable to elicit Exam Limitations: No limitations Goal Planning: Prognosis for improved oropharyngeal function: Good Barriers to Reach Goals: Cognitive deficits No data recorded Patient/Family Stated Goal: nothing specific Consulted and agree with results and recommendations: Pt unable/family or caregiver not available Pain: Pain Assessment Pain Assessment: Faces Faces Pain Scale: 0 End of Session: Start Time:SLP Start Time (ACUTE ONLY): 0910 Stop Time: SLP Stop Time (ACUTE ONLY): 0930 Time Calculation:SLP Time Calculation (min) (ACUTE ONLY): 20 min Charges: SLP Evaluations $ SLP Speech Visit: 1 Visit SLP Evaluations $BSS Swallow: 1 Procedure $MBS Swallow: 1 Procedure $ SLP EVAL LANGUAGE/SOUND PRODUCTION: 1 Procedure $Swallowing Treatment: 1 Procedure SLP visit diagnosis: SLP Visit Diagnosis: Dysphagia, unspecified (R13.10) Past Medical History: Past Medical History: Diagnosis Date  Arthritis   Chronic back pain   Cystoid macular edema of right eye 08/19/2019  Diabetic neuropathy (HCC)   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) 11/18/2019  Hypercholesteremia   Hypertension   Liver lesion   Neuropathy   Type II or unspecified type diabetes mellitus  without mention of complication, not stated as uncontrolled   Vitamin D  deficiency   Wears glasses  Past Surgical History: Past Surgical History: Procedure Laterality Date  ABDOMINAL HYSTERECTOMY    APPENDECTOMY  BACK SURGERY  1993  lumb lam-fusion  BREAST EXCISIONAL BIOPSY Right 1967  benign  CESAREAN SECTION    x 3   COLONOSCOPY    EYE SURGERY    both cataracts  TRIGGER FINGER RELEASE  01/10/2012  Procedure: RELEASE TRIGGER FINGER/A-1 PULLEY;  Surgeon: Yanin KANDICE Herald, MD;  Location: [facility];  Service: Orthopedics;  Laterality: Right;  right ring trigger release Morrell Consuelo Fitch 05/29/2024, 10:08 AM   Vitals:   05/29/24 0756 05/29/24 0834 05/29/24 1031 05/29/24 1204  BP: (!) 189/77 (!) 179/75 (!) 193/69 (!) 148/68  Pulse: 77   73  Resp: 18     Temp: 97.8 F (36.6 C)   97.9 F (36.6 C)  TempSrc: Oral   Oral  SpO2: 98%   100%  Weight:         PHYSICAL EXAM General:  Alert, well-nourished, well-developed patient in no acute distress Psych:  Mood and affect appropriate for situation CV: Regular rate and rhythm on monitor Respiratory:  Regular, unlabored respirations on room air GI: Abdomen soft and nontender   NEURO:  Mental Status: Awake, drowsy but responds to voice briskly.  Able to state her name but disoriented to place time and situation speech is dysarthric Speech/Language: Speech is slightly dysarthric without aphasia  Cranial Nerves:  II: PERRL III, IV, VI: Eyes midline V: Sensation is intact to light touch and symmetrical to face.  VII: Face is symmetrical resting and smiling VIII: hearing intact to voice. KP:Yzji is midline  XII: tongue is midline without fasciculations. Motor/Sensory: Symmetrical antigravity strength in upper extremities, moves lower extremities but does not lift them off the bed today Tone: is normal and bulk is normal Gait- deferred   ASSESSMENT/PLAN  [name] is a 89 y.o. female with history of DM2 with  neuropathy, HLD, HTN, Age related macular degeneration, who presents from her facility with R sided weakness and aphasia with L gaze deviation.  NIH on Admission 21  Encephalopathy  Likely metabolic encephalopathy, multifactorial - fever, HHS, baseline dementia, ? Seizure with prolonged postictal, aseptic meningitis, hypertensive encephalopathy and stroke Managing underlying condition per primary team ID on board Blood culture NGTD S/p LP, CSF WBC 48, RBC 1, protein 103 and glucose 124  Meningitis encephalitis panel - negative  VDRL and crypto neg Culture no growth x 3 days On Rocephin  and vanco - ID managing  Stroke itself is small and not able to fully explain the encephalopathy  Stroke:  small acute infarct in the left CR and punctate subacute R SO infarct, likely small vessel disease based on location   Code Stroke CT head No acute abnormality. Small vessel disease. ASPECTS 10.    CTA head & neck Negative CTA for large vessel occlusion or other emergent finding. MRI  New focal area of restricted diffusion within the left posterior frontal corona radiata, likely representing an acute lacunar infarct. Small focus of possible restricted diffusion within the right centrum semiovale at the frontoparietal junction. Foci of susceptibility/blooming within the right thalamus and left external capsule, most compatible with chronic microhemorrhages. 2D Echo EF 65-70% LDL 35 HgbA1c 10.6 VTE prophylaxis - lovenox  aspirin  81 mg daily prior to admission, now on aspirin  81 and plavix  for 3 weeks and then plavix  alone.   Therapy recommendations:  SNF Disposition:  Pending   Seizure activity - could from HHS, stroke, aseptic meningitis Concerning for seizure at ALF No hx of seizure S/p Keppra  load, now on keppra  500mg  bid  05/26/2024 - rEEG- generalized cerebral dysfunction (encephalopathy). Generalized periodic discharges with triphasic morphology were also noted which can be on the ictal-interictal  continuum. However, given the morphology and frequency, this is most likely related to toxic-metabolic etiology.  On LTM EEG 2/1 - 05/29/2024 0845 similar as above. No seizures or definite epileptiform discharges were seen throughout the recording. D/c LTM EEG 2/4    History of stroke 12/2023 presented with episode of left-sided weakness and altered mental status.  CTA unremarkable, MRI no acute infarct.  LDL 45, A1c 6.5.  Discharged on DAPT and Crestor  20 02/2024 admitted for altered mental status.  MRI showed left parietal punctate infarct. 03/2024 MRI repeat negative.  Hypertension On home Bisoprolol   BP improved now Long term BP goal normotensive  Hyperlipidemia Home meds:  crestor  10mg , resumed in hospital LDL 35, goal < 70 Continue statin at discharge  Diabetes type II Uncontrolled Hyperglycemia HHS Home meds:  Lantus  Hyperglycemia on presentation, >600 HgbA1c 10.6, goal < 7.0 Off insulin  drip, on insulin  subq CBGs/SSI Recommend close follow-up with PCP for better DM control  Leukocytosis SIRS Aseptic meningitis likely Tmax 101->afebrile->afebrile WBC 11.5->19.4->17.2->10.8 B Cx NGTD CSF WBC 48, RBC 1, protein 103 and glucose 124 on vanco, rocephin , off amipicillin and acyclovir   ID on board  Dysphagia Patient has post-stroke dysphagia, SLP consulted Now on dys 3 and thin liquid Off IVF Encourage po intake  Other stroke risk factors Advanced age  Other Active Problems Mild dementia  Hospital day # 3   Patient seen and examined by NP/APP and then by MD. MD to update note as needed.   Cortney E Everitt Clint Kill , MSN, AGACNP-BC [facility] See Amion for schedule and pager information 05/29/2024 12:47 PM   ATTENDING NOTE: I reviewed above note and agree with the assessment and plan. Pt was seen and examined.   Son and palliative care NP Ronal are at the bedside. Pt reclining in bed for lunch, eyes open, awake alert, still moderate dysarthria, but  orientated to place, age, people, month and date but wrong on year. Moving all extremities. No fever, blood culture neg. On Rocephin  and vanco. EEG neg for Sz, will d/c LTM EEG. Passed swallow, on diet. Will switch keppra  to po. Will add plavix  to ASA for DAPT for 3 weeks and then plavix  alone. Resume statin. PT and OT recommend SNF.   For detailed assessment and plan, please refer to above as I have made changes wherever appropriate.   Neurology will sign off. Please call with questions. Pt will follow up with stroke clinic NP at [facility] in about 4 weeks. Thanks for the consult.   Ary Cummins, MD PhD Stroke Neurology 05/29/2024 1:32 PM    To contact Stroke Continuity provider, please refer to [email or web address]. After hours, contact General Neurology

## 2024-05-29 NOTE — Evaluation (Signed)
 Modified Barium Swallow Study  Patient Details  Name: [name] MRN: [id number] Date of Birth: 1930/03/23  Today's Date: 05/29/2024  Modified Barium Swallow completed.  Full report located under Chart Review in the Imaging Section.  History of Present Illness [name] is a 89 y.o. female who presented to the hospital on 05/26/24 from SNF due to stroke and seizure-like activity including shaking and left gaze, right sided weakness. MRI brain concerning for acute lacunar infarct within left posterior frontal corona radiata and left centrum seminal ovale at the frontoparietal junction. CTH without acute finding. Neurology consulted. She was admitted with acute metabolic encephalopathy, seizure like activity. She underwent LP due to possible meningitis. She was kept NPO and SLP ordered for swallow and speech-language cognitive evaluation. PMH: macular degeneration, CVA's, DM-2, neuropathy, HLD, OA.   Clinical Impression Pt demonstrates no acute swallowing impairment recognized on MBS. Pt has some inattention and restlessness and needs assist to take sips or bites. Reducing distractions is helpful and given one step instruction. Pt had one instance of sensed penetration to the vocal folds (PAS4) with sensation and ejection when trying to take a whole pill with thin liquids. Mastication slow but complete. Recommend pt resume thin liquids with precautions - assisted feeding, upright position, pills whole in puree. SLP will f/u for tolerance with meals but pt unlikely to need ongoing interventions for dysphagia. Factors that may increase risk of adverse event in presence of aspiration Noe & Lianne 2021): Reduced cognitive function  Swallow Evaluation Recommendations Recommendations: PO diet PO Diet Recommendation: Dysphagia 3 (Mechanical soft);Thin liquids (Level 0) Liquid Administration via: Cup;Straw Medication Administration: Whole meds with puree Supervision: Staff to assist with  self-feeding Swallowing strategies  : Slow rate;Small bites/sips Postural changes: Position pt fully upright for meals Oral care recommendations: Oral care BID (2x/day)    Consuelo Fort, MA CCC-SLP  Acute Rehabilitation Services Secure Chat Preferred Office [phone]   [place] Fitch 05/29/2024,10:07 AM

## 2024-05-29 NOTE — Progress Notes (Signed)
 Patient ID: JAHZARIA VARY, female   DOB: Apr 10, 1930, 89 y.o.   MRN: [id number]    Progress Note from the Palliative Medicine Team at [facility]   Patient Name: [name]        Date: 05/29/2024 DOB: 1930/02/12  Age: 89 y.o. MRN#: [id number] Attending Physician: Kathrin Mignon DASEN, MD Primary Care Physician: [name], NP Admit Date: 05/25/2024   Reason for Consultation/Follow-up   Establishing Goals of Care   HPI/ Brief Hospital Review  89 y.o. female   admitted on 05/25/2024 with PMH of cognitive impairment, CVA, DM-2, neuropathy, HTN, HLD, CKD-4 and osteoarthritis brought to ED from SNF due to stroke and seizure-like activity.   Patient is a resident of [facility]    Reportedly had some shaking with left gaze and right-sided weakness.  She presented as code stroke which was ruled out by neurology.  She was admitted with hyperosmolar hyperglycemic state and seizure-like activity.   Neurology consulted and MRI brain ordered.   MRI brain concerning for acute lacunar infarct within left posterior frontal corona radiata and left centrum seminal ovale at the frontoparietal junction.  Patient continued to spike fever with leukocytosis and persistent somnolence.  ID consulted recommended LP to rule out meningitis.  They also adjusted antibiotics for possible meningitis.     Patient underwent fluoroscopy guided LP.  CSF fluid with WBC 48, neutrophilic, glucose 134, total protein 103, serum glucose 201, negative meningitis/encephalitis panel and cultures.  Ampicillin  and Valtrex discontinued.  Remains on ceftriaxone  and vancomycin .  Mental status improved.     ID and neurology  also following.       Currently patient does not have medical decision making capacity.  Her 2 sons/next of kin face treatment option decisions, advanced directive decisions and anticipatory care needs.       Subjective  Extensive chart review has been completed prior to meeting with patient/family   including labs (WBCs 10.8), vital signs, imaging, progress/consult notes, orders, medications and available advance directive documents.    This NP assessed patient at the bedside as a follow up to  yesterday's GOCs meeting, and to assess for palliative needs and emotional support.   Son/Mike at bedside.  Patient is much more alert today, follows simple commands/slowly, is oriented to person and place.   She tolerated some of her lunch tray.    Continued conversation regarding current medical situation, Dr Jerri at bedside updating from neurology standpoint.  While in the room Dr Kathrin called and updated from attending standpoint  Plan of Care: -DNR/DNI- limited -Continue to treat the treatable and make ongoing decisions dependant on outcomes   Education offered today regarding  the importance of continued conversation with family and their  medical providers regarding overall plan of care and treatment options,  ensuring decisions are within the context of the patients values and GOCs.  Questions and concerns addressed   Discussed with primary team and nursing staff   Time: 50  minutes  Detailed review of medical records ( labs, imaging, vital signs), medically appropriate exam ( MS, skin, cardiac,  resp)   discussed with treatment team, counseling and education to patient, family, staff, documenting clinical information, medication management, coordination of care    Ronal Plants NP  Palliative Medicine Team Team Phone # [phone] Pager [phone]

## 2024-05-29 NOTE — Progress Notes (Signed)
 Occupational Therapy Treatment Patient Details Name: [name] MRN: [id number] DOB: 1929-08-10 Today's Date: 05/29/2024   History of present illness Pt is a 89 y.o. female who presented due to shaking and increase in L gaze and R side deficits. Pt was admitted due to hyperosmolar  hyperglycemic. 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 EEG showed generalized cerebral dysfunction.  LP completed to rule out meningitis. PMH: DM2, HTN, HLD, CVA   OT comments  Pt presented in bed with nursing present throughout the session. At this time pt noted to wave to therapist with R hand when reporting her name. She then was able to report her name and it appeared to attempt to report she was in the hospital. She engaged in light face washing with cues on sequencing and then attempted to then further in completion of UE bathing but reported I already did this. She then started to pull covers up and started to close her eyes. Patient will benefit from continued inpatient follow up therapy, <3 hours/day.       If plan is discharge home, recommend the following:  Two people to help with walking and/or transfers;Two people to help with bathing/dressing/bathroom;Assistance with cooking/housework;Assistance with feeding;Direct supervision/assist for medications management;Direct supervision/assist for financial management;Assist for transportation;Help with stairs or ramp for entrance;Supervision due to cognitive status   Equipment Recommendations   (TBD)    Recommendations for Other Services      Precautions / Restrictions Precautions Precautions: Fall Recall of Precautions/Restrictions: Impaired Precaution/Restrictions Comments: continuous EEG, mitts Restrictions Weight Bearing Restrictions Per Provider Order: No       Mobility Bed Mobility               General bed  mobility comments: reposition in bed to sitting position in session    Transfers                   General transfer comment: deffered     Balance                                           ADL either performed or assessed with clinical judgement   ADL Overall ADL's : Needs assistance/impaired     Grooming: Wash/dry face;Set up Grooming Details (indicate cue type and reason): with max cues on how to sequence       Lower Body Bathing Details (indicate cue type and reason): attempted to complete but then reported I already did this                            Extremity/Trunk Assessment Upper Extremity Assessment Upper Extremity Assessment: Difficult to assess due to impaired cognition (Pt did engage in use BUE to wash face and reach out in session)   Lower Extremity Assessment Lower Extremity Assessment: Defer to PT evaluation        Vision   Additional Comments: difficult to assess but noted to wave to therapist   Perception     Praxis     Communication Communication Communication: Impaired Factors Affecting Communication: Reduced clarity of speech;Difficulty expressing self   Cognition Arousal: Lethargic Behavior During Therapy: Flat affect Cognition: No family/caregiver present to determine baseline  OT - Cognition Comments: Pt noted to be awake for most of the session but by the end started to close eyes. She was able to report her name and sounded like she was saying hospital when asking where she was but then through the session at times did not appear to know where they were.                 Following commands: Impaired Following commands impaired: Follows one step commands inconsistently      Cueing   Cueing Techniques: Verbal cues, Gestural cues, Tactile cues, Visual cues  Exercises      Shoulder Instructions       General Comments      Pertinent Vitals/ Pain       Pain  Assessment Pain Assessment: 0-10 Faces Pain Scale: Hurts a little bit Breathing: normal Negative Vocalization: none Facial Expression: smiling or inexpressive Body Language: relaxed Consolability: no need to console PAINAD Score: 0 Pain Location: when pt asked about pain said all over Pain Descriptors / Indicators: Discomfort Pain Intervention(s): Limited activity within patient's tolerance, Monitored during session, Repositioned  Home Living                                          Prior Functioning/Environment              Frequency  Min 2X/week        Progress Toward Goals  OT Goals(current goals can now be found in the care plan section)  Progress towards OT goals: Progressing toward goals  Acute Rehab OT Goals Patient Stated Goal: none OT Goal Formulation: Patient unable to participate in goal setting Time For Goal Achievement: 06/10/24 Potential to Achieve Goals: Fair ADL Goals Pt Will Perform Grooming: with max assist;sitting Pt Will Perform Upper Body Bathing: with max assist;sitting Pt Will Perform Lower Body Bathing: with max assist;sitting/lateral leans Additional ADL Goal #1: Pt will engage in 1 ADL task in session with max cues  Plan      Co-evaluation                 AM-PAC OT 6 Clicks Daily Activity     Outcome Measure   Help from another person eating meals?: Total Help from another person taking care of personal grooming?: A Lot Help from another person toileting, which includes using toliet, bedpan, or urinal?: Total Help from another person bathing (including washing, rinsing, drying)?: Total Help from another person to put on and taking off regular upper body clothing?: Total Help from another person to put on and taking off regular lower body clothing?: Total 6 Click Score: 7    End of Session    OT Visit Diagnosis: Unsteadiness on feet (R26.81);Other abnormalities of gait and mobility (R26.89);Muscle  weakness (generalized) (M62.81);Pain Pain - part of body:  (general, unable to report where)   Activity Tolerance Patient limited by lethargy   Patient Left in bed;with call bell/phone within reach;with bed alarm set (chair position)   Nurse Communication Mobility status (was in the room throughout session)        Time: 8849-8784 OT Time Calculation (min): 25 min  Charges: OT General Charges $OT Visit: 1 Visit OT Treatments $Self Care/Home Management : 23-37 mins  Warrick POUR OTR/L  Acute Rehab Services  [phone] office number   Warrick Berber 05/29/2024, 1:38 PM

## 2024-05-29 NOTE — Progress Notes (Signed)
 Back from CT

## 2024-05-29 NOTE — Plan of Care (Addendum)
 CT head done. Her son visited her this morning. Had BP in higher side so PRN meds given, informed to NP about gradually bringing down the BP and getting it to normotensive range. New meds added.   Problem: Fluid Volume: Goal: Ability to maintain a balanced intake and output will improve 05/29/2024 1852 by Dene Harsh, RN Outcome: Progressing 05/29/2024 1618 by Dene Harsh, RN Outcome: Progressing   Problem: Nutritional: Goal: Maintenance of adequate nutrition will improve 05/29/2024 1852 by Dene Harsh, RN Outcome: Progressing 05/29/2024 1618 by Dene Harsh, RN Outcome: Progressing   Problem: Skin Integrity: Goal: Risk for impaired skin integrity will decrease 05/29/2024 1852 by Dene Harsh, RN Outcome: Progressing 05/29/2024 1618 by Dene Harsh, RN Outcome: Progressing   Problem: Tissue Perfusion: Goal: Adequacy of tissue perfusion will improve 05/29/2024 1852 by Dene Harsh, RN Outcome: Progressing 05/29/2024 1618 by Dene Harsh, RN Outcome: Progressing   Problem: Health Behavior/Discharge Planning: Goal: Ability to manage health-related needs will improve 05/29/2024 1852 by Dene Harsh, RN Outcome: Progressing 05/29/2024 1618 by Dene Harsh, RN Outcome: Progressing   Problem: Fluid Volume: Goal: Ability to achieve a balanced intake and output will improve 05/29/2024 1852 by Dene Harsh, RN Outcome: Progressing 05/29/2024 1618 by Dene Harsh, RN Outcome: Progressing   Problem: Nutritional: Goal: Maintenance of adequate nutrition will improve 05/29/2024 1852 by Dene Harsh, RN Outcome: Progressing 05/29/2024 1618 by Dene Harsh, RN Outcome: Progressing   Problem: Education: Goal: Knowledge of General Education information will improve Description: Including pain rating scale, medication(s)/side effects and non-pharmacologic comfort measures 05/29/2024 1852 by Dene Harsh, RN Outcome: Progressing 05/29/2024 1618 by  Dene Harsh, RN Outcome: Progressing   Problem: Clinical Measurements: Goal: Will remain free from infection 05/29/2024 1852 by Dene Harsh, RN Outcome: Progressing 05/29/2024 1618 by Dene Harsh, RN Outcome: Progressing   Problem: Activity: Goal: Risk for activity intolerance will decrease 05/29/2024 1852 by Dene Harsh, RN Outcome: Progressing 05/29/2024 1618 by Dene Harsh, RN Outcome: Progressing   Problem: Nutrition: Goal: Adequate nutrition will be maintained 05/29/2024 1852 by Dene Harsh, RN Outcome: Progressing 05/29/2024 1618 by Dene Harsh, RN Outcome: Progressing   Problem: Coping: Goal: Level of anxiety will decrease 05/29/2024 1852 by Dene Harsh, RN Outcome: Progressing 05/29/2024 1618 by Dene Harsh, RN Outcome: Progressing   Problem: Elimination: Goal: Will not experience complications related to bowel motility 05/29/2024 1852 by Dene Harsh, RN Outcome: Progressing 05/29/2024 1618 by Dene Harsh, RN Outcome: Progressing   Problem: Elimination: Goal: Will not experience complications related to urinary retention 05/29/2024 1852 by Dene Harsh, RN Outcome: Progressing 05/29/2024 1618 by Dene Harsh, RN Outcome: Progressing   Problem: Safety: Goal: Ability to remain free from injury will improve 05/29/2024 1852 by Dene Harsh, RN Outcome: Progressing 05/29/2024 1618 by Dene Harsh, RN Outcome: Progressing   Problem: Skin Integrity: Goal: Risk for impaired skin integrity will decrease 05/29/2024 1852 by Dene Harsh, RN Outcome: Progressing 05/29/2024 1618 by Dene Harsh, RN Outcome: Progressing   Problem: Education: Goal: Knowledge of disease or condition will improve 05/29/2024 1852 by Dene Harsh, RN Outcome: Progressing 05/29/2024 1618 by Dene Harsh, RN Outcome: Progressing

## 2024-05-30 ENCOUNTER — Other Ambulatory Visit (HOSPITAL_COMMUNITY): Payer: Self-pay

## 2024-05-30 DIAGNOSIS — E1100 Type 2 diabetes mellitus with hyperosmolarity without nonketotic hyperglycemic-hyperosmolar coma (NKHHC): Secondary | ICD-10-CM | POA: Diagnosis not present

## 2024-05-30 DIAGNOSIS — Z515 Encounter for palliative care: Secondary | ICD-10-CM

## 2024-05-30 DIAGNOSIS — G9341 Metabolic encephalopathy: Secondary | ICD-10-CM | POA: Diagnosis not present

## 2024-05-30 DIAGNOSIS — R4182 Altered mental status, unspecified: Secondary | ICD-10-CM

## 2024-05-30 DIAGNOSIS — I639 Cerebral infarction, unspecified: Secondary | ICD-10-CM | POA: Diagnosis not present

## 2024-05-30 LAB — CSF CULTURE W GRAM STAIN: Culture: NO GROWTH

## 2024-05-30 LAB — GLUCOSE, CAPILLARY
Glucose-Capillary: 122 mg/dL — ABNORMAL HIGH (ref 70–99)
Glucose-Capillary: 130 mg/dL — ABNORMAL HIGH (ref 70–99)
Glucose-Capillary: 153 mg/dL — ABNORMAL HIGH (ref 70–99)
Glucose-Capillary: 168 mg/dL — ABNORMAL HIGH (ref 70–99)
Glucose-Capillary: 173 mg/dL — ABNORMAL HIGH (ref 70–99)
Glucose-Capillary: 228 mg/dL — ABNORMAL HIGH (ref 70–99)

## 2024-05-30 LAB — COMPREHENSIVE METABOLIC PANEL WITH GFR
ALT: 17 U/L (ref 0–44)
AST: 36 U/L (ref 15–41)
Albumin: 3.2 g/dL — ABNORMAL LOW (ref 3.5–5.0)
Alkaline Phosphatase: 67 U/L (ref 38–126)
Anion gap: 11 (ref 5–15)
BUN: 12 mg/dL (ref 8–23)
CO2: 26 mmol/L (ref 22–32)
Calcium: 8.6 mg/dL — ABNORMAL LOW (ref 8.9–10.3)
Chloride: 104 mmol/L (ref 98–111)
Creatinine, Ser: 1.18 mg/dL — ABNORMAL HIGH (ref 0.44–1.00)
GFR, Estimated: 43 mL/min — ABNORMAL LOW
Glucose, Bld: 104 mg/dL — ABNORMAL HIGH (ref 70–99)
Potassium: 3.5 mmol/L (ref 3.5–5.1)
Sodium: 140 mmol/L (ref 135–145)
Total Bilirubin: 0.4 mg/dL (ref 0.0–1.2)
Total Protein: 6 g/dL — ABNORMAL LOW (ref 6.5–8.1)

## 2024-05-30 LAB — CBC
HCT: 33.9 % — ABNORMAL LOW (ref 36.0–46.0)
Hemoglobin: 11.7 g/dL — ABNORMAL LOW (ref 12.0–15.0)
MCH: 32.8 pg (ref 26.0–34.0)
MCHC: 34.5 g/dL (ref 30.0–36.0)
MCV: 95 fL (ref 80.0–100.0)
Platelets: 191 10*3/uL (ref 150–400)
RBC: 3.57 MIL/uL — ABNORMAL LOW (ref 3.87–5.11)
RDW: 12.7 % (ref 11.5–15.5)
WBC: 10.9 10*3/uL — ABNORMAL HIGH (ref 4.0–10.5)
nRBC: 0 % (ref 0.0–0.2)

## 2024-05-30 LAB — MAGNESIUM: Magnesium: 2.6 mg/dL — ABNORMAL HIGH (ref 1.7–2.4)

## 2024-05-30 LAB — PHOSPHORUS: Phosphorus: 2.5 mg/dL (ref 2.5–4.6)

## 2024-05-30 MED ORDER — ASPIRIN 81 MG PO TBEC: 81.0000 mg | DELAYED_RELEASE_TABLET | Freq: Every day | ORAL | Status: AC

## 2024-05-30 MED ORDER — INSULIN ASPART 100 UNIT/ML IJ SOLN: 0.0000 [IU] | INTRAMUSCULAR | Status: AC

## 2024-05-30 MED ORDER — ENSURE PLUS HIGH PROTEIN PO LIQD
237.0000 mL | Freq: Two times a day (BID) | ORAL | Status: AC
  Administered 2024-05-30 – 2024-05-31 (×3): 237 mL via ORAL

## 2024-05-30 MED ORDER — CLOPIDOGREL BISULFATE 75 MG PO TABS: 75.0000 mg | ORAL_TABLET | Freq: Every day | ORAL | Status: AC

## 2024-05-30 MED ORDER — LEVETIRACETAM 500 MG PO TABS: 500.0000 mg | ORAL_TABLET | Freq: Two times a day (BID) | ORAL | Status: AC

## 2024-05-30 MED ORDER — BISOPROLOL FUMARATE 10 MG PO TABS
10.0000 mg | ORAL_TABLET | Freq: Every day | ORAL | Status: AC
  Administered 2024-05-30 – 2024-05-31 (×2): 10 mg via ORAL
  Filled 2024-05-30 (×2): qty 1

## 2024-05-30 MED ORDER — AMOXICILLIN-POT CLAVULANATE 500-125 MG PO TABS
1.0000 | ORAL_TABLET | Freq: Two times a day (BID) | ORAL | Status: AC
  Administered 2024-05-30 – 2024-05-31 (×4): 1 via ORAL
  Filled 2024-05-30 (×4): qty 1

## 2024-05-30 MED ORDER — AMOXICILLIN-POT CLAVULANATE 500-125 MG PO TABS
1.0000 | ORAL_TABLET | Freq: Two times a day (BID) | ORAL | 0 refills | Status: DC
  Filled 2024-05-30: qty 14, 7d supply, fill #0

## 2024-05-30 MED ORDER — ACETAMINOPHEN 325 MG PO TABS: 650.0000 mg | ORAL_TABLET | Freq: Three times a day (TID) | ORAL | Status: AC

## 2024-05-30 MED ORDER — AMOXICILLIN-POT CLAVULANATE 500-125 MG PO TABS: 1.0000 | ORAL_TABLET | Freq: Two times a day (BID) | ORAL | Status: AC

## 2024-05-30 MED ORDER — ASPIRIN 81 MG PO CHEW
81.0000 mg | CHEWABLE_TABLET | Freq: Every day | ORAL | Status: AC
  Administered 2024-05-30 – 2024-05-31 (×2): 81 mg via ORAL
  Filled 2024-05-30 (×2): qty 1

## 2024-05-30 NOTE — Progress Notes (Addendum)
 " PROGRESS NOTE  SENECA GADBOIS FMW:[id number] DOB: 25-Apr-1930   PCP: [name], NP  Patient is from: ALF  DOA: 05/25/2024 LOS: 4  Chief complaints Chief Complaint  Patient presents with   Code Stroke     Brief Narrative / Interim history: 89 year old F with PMH of cognitive impairment, CVA, DM-2, neuropathy, HTN, HLD, CKD-3B and osteoarthritis brought to ED from SNF due to stroke and seizure-like activity.  Reportedly had some shaking with left gaze and right-sided weakness.  She presented as code stroke which was ruled out by neurology.  She was admitted with hyperosmolar hyperglycemic state and seizure-like activity.   In ED, SBP elevated to 190s but improved quickly.  CBG > 600.  Serum osmolality 337.  BMP glucose 683.  Na 132.  Bicarb 23.  AG 13.  Hgb 11.3.  EtOH level undetectable.  CT head without acute finding.  VBG reassuring.  Patient was started on insulin  drip and IV fluid and admitted for HHS and acute metabolic encephalopathy.  Neurology consulted and MRI brain ordered.   MRI brain concerning for acute lacunar infarct within left posterior frontal corona radiata and left centrum seminal ovale at the frontoparietal junction.  Patient continued to spike fever with leukocytosis and persistent somnolence.  ID consulted recommended LP to rule out meningitis.  They also adjusted antibiotics for possible meningitis.  Palliative medicine consulted as well.   Patient underwent fluoroscopy guided LP.  CSF fluid with WBC 48, neutrophilic, glucose 134, total protein 103, serum glucose 201, negative meningitis/encephalitis panel and cultures.  Ampicillin  and Valtrex discontinued.  Remains on ceftriaxone  and vancomycin .  Mental status improved.  Fever resolved.  Leukocytosis improved. CT chest, abdomen and pelvis on 2/4 raise concern for proctitis.  ID recommended p.o. Augmentin  for 7 days.  SLP recommended dysphagia 3 diet.   In regards to stroke, neurology recommended Plavix  and aspirin   for 3 weeks followed by Plavix  alone.   Medically stable for discharge.   Subjective: Seen and examined earlier this morning.  No major events overnight of this morning.  No complaints other than generalized body pain.  She is awake and alert and oriented to self, place Assessment and plan: Poorly controlled IDDM-2 with hyperosmolar hyperglycemic state and neuropathy:  Due to noncompliance?  A1c 6% (7.2% in 03/2024).  Supposed to be on Lantus  8 units at bedtime. Serum osmolality elevated to 337.  CBG within acceptable range. -Continue sliding scale insulin -very sensitive   Acute metabolic encephalopathy: Multifactorial including HHS, infection (possible meningitis), CVA (MRI positive for acute lacunar CVA), delirium and possible seizure.  Reportedly had shaking and left gaze when she presented to ED.  CT head and CT angio head and neck without acute finding.  Does not seem to have focal neurodeficit but limited exam due to encephalopathy.  VBG reassuring.  RVP, UA, TSH, ammonia and B12 unrevealing.  LTM EEG negative for seizure or epileptiform discharge.  CSF analysis negative.  Encephalopathy resolved.  She is awake and alert and oriented to self and place but not time which is likely baseline. -Continue Keppra  for seizure-like activity -Continue dysphagia 3 diet per SLP.   Acute lacunar infarct/history of CVA: Likely ischemic. MRI brain concerning for acute lacunar infarct within left posterior frontal corona radiata and left centrum seminal ovale at the frontoparietal junction.  CT angio head and neck without LVO.  TTE without significant finding.  LDL 35.  A1c 10.6. -Neurology recommended aspirin  and Plavix  for 3 weeks followed by Plavix  alone -Symptom  control as above. -Outpatient follow-up with neurology   Seizure-like activity: Reportedly had some shaking and left gaze on presentation.  A.m. EEG without definite seizure or epileptiform discharge.  - Continue Keppra  500 mg twice daily per  neurology.  SIRS/proctitis: Was febrile with leukocytosis.  Unclear source of infection except proctitis.  CSF analysis does not suggest infection.  UA and CXR unrevealing.  Fever, lactic acidosis and encephalopathy resolved.  Leukocytosis resolving. -ID recommended de-escalating antibiotics to p.o. Augmentin  for 7 days.   Paroxysmal atrial fibrillation: Brief A-fib on telemetry that has resolved. -Increase bisoprolol  mainly for blood pressure -Deferred anticoagulation given age and transient A-fib likely provoked   Uncontrolled hypertension: Blood pressure improved.  On Zebeta  at home. - Increased bisoprolol  to 10 mg daily.   AKI on CKD-3B: Unclear baseline but Cr improved to 1.16.  Relatively stable. CKD-4 ruled out. - Recheck renal function in 1 week -Optimize hydration   History of cognitive impairment: History of memory loss and a problem list. -See encephalopathy for management   Hypokalemia/hypomagnesemia: Resolved   Anemia of chronic disease: Hgb at baseline. - Check CBC in 1 week   Hyperlipidemia - Continue statin   Leukocytosis/bandemia: Resolving. - Recheck CBC in 1 week   Nutrition  Nutrition Body mass index is 19.76 kg/m.           DVT prophylaxis:  enoxaparin  (LOVENOX ) injection 30 mg Start: 05/26/24 1000  Code Status: DNR Family Communication: Updated patient's son over the phone  Level of care: Progressive Status is: Inpatient Remains inpatient appropriate because: Glycemic crisis, encephalopathy, seizure-like activity, SIRS and possible meningitis   Final disposition: Yet to be determined   55 minutes with more than 50% spent in reviewing records, counseling patient/family and coordinating care.  Consultants:  Neurology Infectious disease Palliative medicine  Procedures: Lumbar puncture  Microbiology summarized: COVID-19, influenza and RSV PCR nonreactive A-20 pathogen RVP nonreactive Blood culture NGTD CSF culture  NGTD  Objective: Vitals:   05/30/24 0750 05/30/24 0816 05/30/24 1227 05/30/24 1543  BP: (!) 149/120 (!) 181/135 (!) 154/90 (!) 145/67  Pulse: 80 85 76 73  Resp: 18 17 18 18   Temp: 97.9 F (36.6 C) (!) 97.5 F (36.4 C) 97.9 F (36.6 C) 97.6 F (36.4 C)  TempSrc:  Axillary  Oral  SpO2: 100%  97% 100%  Weight:      Height:        Examination:  GENERAL: No apparent distress.  Nontoxic. HEENT: MMM.  Vision and hearing grossly intact.  NECK: Supple.  No apparent JVD.  RESP:  No IWOB.  Fair aeration bilaterally. CVS:  RRR. Heart sounds normal.  ABD/GI/GU: BS+. Abd soft, NTND.  MSK/EXT:  Moves extremities. No apparent deformity. No edema.  SKIN: no apparent skin lesion or wound NEURO: Awake and alert. Oriented to self and place.  Follows commands.  No apparent focal neuro deficit. PSYCH: Calm. Normal affect.   Sch Meds:  Scheduled Meds:  amoxicillin -clavulanate  1 tablet Oral Q12H   aspirin   81 mg Oral Daily   bisoprolol   10 mg Oral Daily   clopidogrel   75 mg Oral Daily   enoxaparin  (LOVENOX ) injection  30 mg Subcutaneous Q24H   feeding supplement  237 mL Oral BID BM   insulin  aspart  0-6 Units Subcutaneous Q4H   levETIRAcetam   500 mg Oral BID   rosuvastatin   10 mg Oral q AM   Continuous Infusions:   PRN Meds:.dextrose , labetalol , LORazepam , mouth rinse  Antimicrobials: Anti-infectives (From admission, onward)  Start     Dose/Rate Route Frequency Ordered Stop   05/30/24 2200  vancomycin  (VANCOREADY) IVPB 500 mg/100 mL  Status:  Discontinued        500 mg 100 mL/hr over 60 Minutes Intravenous Every 48 hours 05/29/24 0808 05/30/24 0807   05/30/24 1000  cefTRIAXone  (ROCEPHIN ) 2 g in sodium chloride  0.9 % 100 mL IVPB  Status:  Discontinued        2 g 200 mL/hr over 30 Minutes Intravenous Every 24 hours 05/29/24 1538 05/30/24 0807   05/30/24 1000  amoxicillin -clavulanate (AUGMENTIN ) 500-125 MG per tablet 1 tablet        1 tablet Oral Every 12 hours 05/30/24 0807  06/06/24 0959   05/30/24 0000  amoxicillin -clavulanate (AUGMENTIN ) 500-125 MG tablet  Status:  Discontinued        1 tablet Oral Every 12 hours 05/30/24 1128 05/30/24    05/30/24 0000  amoxicillin -clavulanate (AUGMENTIN ) 500-125 MG tablet        1 tablet Oral Every 12 hours 05/30/24 1128 06/06/24 2359   05/28/24 2200  vancomycin  (VANCOREADY) IVPB 500 mg/100 mL  Status:  Discontinued        500 mg 100 mL/hr over 60 Minutes Intravenous Every 24 hours 05/27/24 1336 05/29/24 0808   05/27/24 1800  cefTRIAXone  (ROCEPHIN ) 2 g in sodium chloride  0.9 % 100 mL IVPB  Status:  Discontinued        2 g 200 mL/hr over 30 Minutes Intravenous Every 12 hours 05/27/24 1326 05/29/24 1538   05/27/24 1500  acyclovir  (ZOVIRAX ) 500 mg in dextrose  5 % 100 mL IVPB  Status:  Discontinued        500 mg 110 mL/hr over 60 Minutes Intravenous Every 24 hours 05/27/24 1412 05/28/24 0905   05/27/24 1430  vancomycin  (VANCOCIN ) IVPB 1000 mg/200 mL premix        1,000 mg 200 mL/hr over 60 Minutes Intravenous  Once 05/27/24 1336 05/27/24 1456   05/27/24 1415  ampicillin  (OMNIPEN) 2 g in sodium chloride  0.9 % 100 mL IVPB  Status:  Discontinued        2 g 300 mL/hr over 20 Minutes Intravenous Every 8 hours 05/27/24 1326 05/28/24 0905   05/26/24 1000  linezolid  (ZYVOX ) IVPB 600 mg  Status:  Discontinued        600 mg 300 mL/hr over 60 Minutes Intravenous Every 12 hours 05/26/24 0737 05/27/24 1326   05/26/24 0830  piperacillin -tazobactam (ZOSYN ) IVPB 2.25 g  Status:  Discontinued        2.25 g 100 mL/hr over 30 Minutes Intravenous Every 8 hours 05/26/24 0820 05/27/24 1326        I have personally reviewed the following labs and images: CBC: Recent Labs  Lab 05/25/24 2221 05/25/24 2222 05/26/24 0044 05/26/24 0816 05/27/24 0155 05/28/24 0403 05/29/24 0807 05/30/24 0200  WBC 9.4  --  11.5*  --  19.4* 17.2* 10.8* 10.9*  NEUTROABS 5.8  --   --   --   --   --   --   --   HGB 11.3*   < > 11.5* 10.9* 12.8 11.6* 11.0*  11.7*  HCT 33.0*   < > 34.2* 32.0* 36.8 32.6* 31.6* 33.9*  MCV 97.9  --  97.4  --  94.8 93.4 94.3 95.0  PLT 265  --  224  --  182 208 183 191   < > = values in this interval not displayed.   BMP &GFR Recent Labs  Lab 05/26/24 0455  05/26/24 0816 05/27/24 0155 05/28/24 0403 05/29/24 0130 05/29/24 0807 05/30/24 0200  NA 140 141 138 142  --  141 140  K 3.4* 3.4* 3.6 2.8*  --  3.5 3.5  CL 103  --  100 105  --  106 104  CO2 24  --  22 25  --  25 26  GLUCOSE 182*  --  201* 104*  --  85 104*  BUN 22  --  19 23  --  14 12  CREATININE 1.45*  --  1.61* 1.69*  --  1.16* 1.18*  CALCIUM  9.5  --  9.4 8.9  --  8.2* 8.6*  MG  --   --  1.7 1.7 1.5* 1.7 2.6*  PHOS  --   --  2.0* 3.2  --  2.4* 2.5   Estimated Creatinine Clearance: 23.5 mL/min (A) (by C-G formula based on SCr of 1.18 mg/dL (H)). Liver & Pancreas: Recent Labs  Lab 05/25/24 2221 05/27/24 0155 05/28/24 0403 05/29/24 0807 05/30/24 0200  AST 38 44*  --   --  36  ALT 22 18  --   --  17  ALKPHOS 150* 84  --   --  67  BILITOT 0.3 0.7  --   --  0.4  PROT 7.1 7.1  --   --  6.0*  ALBUMIN 3.8 3.8 3.3* 3.0* 3.2*   No results for input(s): LIPASE, AMYLASE in the last 168 hours. Recent Labs  Lab 05/26/24 0805  AMMONIA 19   Diabetic: No results for input(s): HGBA1C in the last 72 hours.  Recent Labs  Lab 05/29/24 2338 05/30/24 0304 05/30/24 0749 05/30/24 1226 05/30/24 1545  GLUCAP 130* 122* 168* 130* 153*   Cardiac Enzymes: Recent Labs  Lab 05/26/24 1403 05/27/24 0155  CKTOTAL 222 505*   No results for input(s): PROBNP in the last 8760 hours. Coagulation Profile: Recent Labs  Lab 05/25/24 2221  INR 1.0   Thyroid  Function Tests: No results for input(s): TSH, T4TOTAL, FREET4, T3FREE, THYROIDAB in the last 72 hours.  Lipid Profile: No results for input(s): CHOL, HDL, LDLCALC, TRIG, CHOLHDL, LDLDIRECT in the last 72 hours.  Anemia Panel: No results for input(s): VITAMINB12,  FOLATE, FERRITIN, TIBC, IRON, RETICCTPCT in the last 72 hours.  Urine analysis:    Component Value Date/Time   COLORURINE STRAW (A) 05/26/2024 0542   APPEARANCEUR CLEAR 05/26/2024 0542   LABSPEC 1.027 05/26/2024 0542   PHURINE 6.0 05/26/2024 0542   GLUCOSEU >=500 (A) 05/26/2024 0542   HGBUR NEGATIVE 05/26/2024 0542   BILIRUBINUR NEGATIVE 05/26/2024 0542   KETONESUR NEGATIVE 05/26/2024 0542   PROTEINUR 100 (A) 05/26/2024 0542   UROBILINOGEN 0.2 07/29/2012 1237   NITRITE NEGATIVE 05/26/2024 0542   LEUKOCYTESUR NEGATIVE 05/26/2024 0542   Sepsis Labs: Invalid input(s): PROCALCITONIN, LACTICIDVEN  Microbiology: Recent Results (from the past 240 hours)  Culture, blood (x 2)     Status: None (Preliminary result)   Collection Time: 05/26/24  5:08 AM   Specimen: BLOOD LEFT HAND  Result Value Ref Range Status   Specimen Description BLOOD LEFT HAND  Final   Special Requests   Final    BOTTLES DRAWN AEROBIC ONLY Blood Culture results may not be optimal due to an inadequate volume of blood received in culture bottles   Culture   Final    NO GROWTH 4 DAYS Performed at [facility], [address], [place], [place] [address]    Report Status PENDING  Incomplete  Resp  panel by RT-PCR (RSV, Flu A&B, Covid)     Status: None   Collection Time: 05/26/24  5:10 AM   Specimen: Nasal Swab  Result Value Ref Range Status   SARS Coronavirus 2 by RT PCR NEGATIVE NEGATIVE Final   Influenza A by PCR NEGATIVE NEGATIVE Final   Influenza B by PCR NEGATIVE NEGATIVE Final    Comment: (NOTE) The Xpert Xpress SARS-CoV-2/FLU/RSV plus assay is intended as an aid in the diagnosis of influenza from Nasopharyngeal swab specimens and should not be used as a sole basis for treatment. Nasal washings and aspirates are unacceptable for Xpert Xpress SARS-CoV-2/FLU/RSV testing.  Fact Sheet for Patients: bloggercourse.com  Fact Sheet for Healthcare  Providers: seriousbroker.it  This test is not yet approved or cleared by the [place]  FDA and has been authorized for detection and/or diagnosis of SARS-CoV-2 by FDA under an Emergency Use Authorization (EUA). This EUA will remain in effect (meaning this test can be used) for the duration of the COVID-19 declaration under Section 564(b)(1) of the Act, 21 U.S.C. section 360bbb-3(b)(1), unless the authorization is terminated or revoked.     Resp Syncytial Virus by PCR NEGATIVE NEGATIVE Final    Comment: (NOTE) Fact Sheet for Patients: bloggercourse.com  Fact Sheet for Healthcare Providers: seriousbroker.it  This test is not yet approved or cleared by the [place]  FDA and has been authorized for detection and/or diagnosis of SARS-CoV-2 by FDA under an Emergency Use Authorization (EUA). This EUA will remain in effect (meaning this test can be used) for the duration of the COVID-19 declaration under Section 564(b)(1) of the Act, 21 U.S.C. section 360bbb-3(b)(1), unless the authorization is terminated or revoked.  Performed at [facility], [address] [place], [place] [address]   Culture, blood (x 2)     Status: None (Preliminary result)   Collection Time: 05/26/24  5:13 AM   Specimen: BLOOD  Result Value Ref Range Status   Specimen Description BLOOD RIGHT ANTECUBITAL  Final   Special Requests   Final    BOTTLES DRAWN AEROBIC AND ANAEROBIC Blood Culture adequate volume   Culture   Final    NO GROWTH 4 DAYS Performed at [facility], [address], [place], [place] [address]    Report Status PENDING  Incomplete  Respiratory (~20 pathogens) panel by PCR     Status: None   Collection Time: 05/26/24  7:34 AM   Specimen: Nasopharyngeal Swab; Respiratory  Result Value Ref Range Status   Adenovirus NOT DETECTED NOT DETECTED Final   Coronavirus 229E NOT DETECTED NOT DETECTED Final     Comment: (NOTE) The Coronavirus on the Respiratory Panel, DOES NOT test for the novel  Coronavirus (2019 nCoV)    Coronavirus HKU1 NOT DETECTED NOT DETECTED Final   Coronavirus NL63 NOT DETECTED NOT DETECTED Final   Coronavirus OC43 NOT DETECTED NOT DETECTED Final   Metapneumovirus NOT DETECTED NOT DETECTED Final   Rhinovirus / Enterovirus NOT DETECTED NOT DETECTED Final   Influenza A NOT DETECTED NOT DETECTED Final   Influenza B NOT DETECTED NOT DETECTED Final   Parainfluenza Virus 1 NOT DETECTED NOT DETECTED Final   Parainfluenza Virus 2 NOT DETECTED NOT DETECTED Final   Parainfluenza Virus 3 NOT DETECTED NOT DETECTED Final   Parainfluenza Virus 4 NOT DETECTED NOT DETECTED Final   Respiratory Syncytial Virus NOT DETECTED NOT DETECTED Final   Bordetella pertussis NOT DETECTED NOT DETECTED Final   Bordetella Parapertussis NOT DETECTED NOT DETECTED Final  Chlamydophila pneumoniae NOT DETECTED NOT DETECTED Final   Mycoplasma pneumoniae NOT DETECTED NOT DETECTED Final    Comment: Performed at [facility], [address], [place], [place] [address]  CSF culture w Gram Stain     Status: None   Collection Time: 05/27/24  4:10 PM   Specimen: PATH Cytology CSF; Cerebrospinal Fluid  Result Value Ref Range Status   Specimen Description CSF  Final   Special Requests NONE  Final   Gram Stain   Final    WBC PRESENT, PREDOMINANTLY PMN NO ORGANISMS SEEN CYTOSPIN SMEAR    Culture   Final    NO GROWTH 3 DAYS Performed at [facility], [address], [place], [place] [address]    Report Status 05/30/2024 FINAL  Final  Anaerobic culture w Gram Stain     Status: None (Preliminary result)   Collection Time: 05/27/24  4:40 PM   Specimen: Back; Cerebrospinal Fluid  Result Value Ref Range Status   Specimen Description CSF  Final   Special Requests   Final    NONE Performed at [facility], [address], [place], [place] [address]    Culture   Final    NO ANAEROBES  ISOLATED; CULTURE IN PROGRESS FOR 5 DAYS   Report Status PENDING  Incomplete    Radiology Studies: No results found.     Rindi Beechy T. Mercedes Valeriano [facility]  If 7PM-7AM, please contact night-coverage [email or web address] 05/30/2024, 3:56 PM   "

## 2024-05-30 NOTE — Progress Notes (Signed)
 ID brief note   Remains afebrile  CT CAP with circumferential rectal wall thickening with perirectal and presacral fat stranding compatible with proctitis  Can be transitioned to PO augmentin  to complete 7 days tx through 2/8 ID will so, recall back with questions or concerns.   Annalee Orem, MD Infectious Disease Physician [facility] [address] [place], [place] [address] Phone: [phone]  Fax: [phone]

## 2024-05-30 NOTE — NC FL2 (Signed)
 " [place]  MEDICAID FL2 LEVEL OF CARE FORM     IDENTIFICATION  Patient Name: [name] Birthdate: Nov 03, 1929 Sex: female Admission Date (Current Location): 05/25/2024  [facility] and [place] Number:  Producer, Television/film/video and Address:  [facility] [place], [address] [place], [place] [address]      Provider Number: [id number]  Attending Physician Name and Address:  Kathrin Mignon DASEN, MD  Relative Name and Phone Number:       Current Level of Care: Hospital Recommended Level of Care: Skilled Nursing Facility Prior Approval Number:    Date Approved/Denied:   PASRR Number: [id number]  Discharge Plan: SNF    Current Diagnoses: Patient Active Problem List   Diagnosis Date Noted   Hyperosmolar hyperglycemic state (HHS) (HCC) 05/26/2024   ABLA (acute blood loss anemia) 04/11/2024   Fall 04/11/2024   Hematoma of left hip 04/11/2024   History of CVA (cerebrovascular accident) 04/09/2024   Malnutrition of moderate degree 04/08/2024   AKI (acute kidney injury) 04/04/2024   Acute metabolic encephalopathy 03/18/2024   CKD stage 3b, GFR 30-44 ml/min (HCC) 10/12/2023   Hypercalcemia 10/12/2023   Sepsis secondary to UTI (HCC) 10/12/2023   Lactic acidosis 10/12/2023   UTI (urinary tract infection) 11/02/2022   Acute renal failure superimposed on stage 3b chronic kidney disease (HCC) 11/02/2022   Frequent UTI 02/10/2020   Advanced nonexudative age-related macular degeneration of left eye with subfoveal involvement 01/08/2020   Memory changes 12/31/2019   Poorly controlled type 2 diabetes mellitus (HCC) 12/31/2019   Poor compliance with medication 12/31/2019   Advanced nonexudative age-related macular degeneration of right eye with subfoveal involvement 11/18/2019   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) 11/18/2019   Exudative age-related macular degeneration of right eye with active choroidal  neovascularization (HCC) 08/19/2019   Exudative age-related macular degeneration of left eye with active choroidal neovascularization (HCC) 08/19/2019   Long term (current) use of insulin  (HCC) 06/10/2019   Major depression, recurrent 12/20/2018   Type 2 diabetes mellitus with complication, with long-term current use of insulin  (HCC) 02/09/2017   Atherosclerosis of aorta (HCC) by AbdCTscan on 06/28/2017 11/06/2016   Goals of care, counseling/discussion 05/18/2015   Medication management 08/06/2013   CKD stage 3 due to type 2 diabetes mellitus (HCC)    Hyperlipidemia associated with type 2 diabetes mellitus (HCC)    Vitamin D  deficiency    Hypertension 07/29/2012   Chronic pain 07/29/2012    Orientation RESPIRATION BLADDER Height & Weight     Self  Normal Incontinent Weight: 108 lb 0.4 oz (49 kg) Height:  5' 2 (157.5 cm)  BEHAVIORAL SYMPTOMS/MOOD NEUROLOGICAL BOWEL NUTRITION STATUS      Incontinent Diet (see DC summary)  AMBULATORY STATUS COMMUNICATION OF NEEDS Skin   Extensive Assist Non-Verbally Normal                       Personal Care Assistance Level of Assistance  Bathing, Feeding, Dressing Bathing Assistance: Maximum assistance Feeding assistance: Maximum assistance Dressing Assistance: Maximum assistance     Functional Limitations Info  Speech     Speech Info: Impaired    SPECIAL CARE FACTORS FREQUENCY  PT (By licensed PT), OT (By licensed OT), Speech therapy     PT Frequency: 5x/wk OT Frequency: 5x/wk     Speech Therapy Frequency: 5x/wk      Contractures Contractures Info: Not present    Additional Factors Info  Code Status, Allergies Code Status Info: DNR Allergies Info: Ciprofloxacin , Morphine And Codeine           Current Medications (05/30/2024):  This is the current hospital active medication list Current Facility-Administered Medications  Medication Dose Route Frequency Provider Last Rate Last Admin   amoxicillin -clavulanate  (AUGMENTIN ) 500-125 MG per tablet 1 tablet  1 tablet Oral Q12H Gonfa, Taye T, MD       aspirin  EC tablet 81 mg  81 mg Oral Daily Gonfa, Taye T, MD       bisoprolol  (ZEBETA ) tablet 10 mg  10 mg Oral Daily Gonfa, Taye T, MD       clopidogrel  (PLAVIX ) tablet 75 mg  75 mg Oral Daily Jerri Pfeiffer, MD   75 mg at 05/29/24 1515   dextrose  50 % solution 0-50 mL  0-50 mL Intravenous PRN Cottie Donnice PARAS, MD       enoxaparin  (LOVENOX ) injection 30 mg  30 mg Subcutaneous Q24H Sim, Mohammad L, MD   30 mg at 05/29/24 1040   feeding supplement (ENSURE PLUS HIGH PROTEIN) liquid 237 mL  237 mL Oral BID BM Gonfa, Taye T, MD       insulin  aspart (novoLOG ) injection 0-6 Units  0-6 Units Subcutaneous Q4H Gonfa, Taye T, MD   1 Units at 05/30/24 0810   labetalol  (NORMODYNE ) injection 10 mg  10 mg Intravenous Q2H PRN Gonfa, Taye T, MD   10 mg at 05/30/24 0844   levETIRAcetam  (KEPPRA ) tablet 500 mg  500 mg Oral BID Jerri Pfeiffer, MD   500 mg at 05/29/24 2105   LORazepam  (ATIVAN ) injection 1 mg  1 mg Intravenous Once PRN Khaliqdina, Salman, MD       Oral care mouth rinse  15 mL Mouth Rinse PRN Gonfa, Taye T, MD       rosuvastatin  (CRESTOR ) tablet 10 mg  10 mg Oral q AM Jerri Pfeiffer, MD   10 mg at 05/30/24 9441     Discharge Medications: Please see discharge summary for a list of discharge medications.  Relevant Imaging Results:  Relevant Lab Results:   Additional Information SS#: [id number]  Almarie CHRISTELLA Goodie, LCSW     "

## 2024-05-30 NOTE — TOC Progression Note (Signed)
 Transition of Care [facility]) - Progression Note    Patient Details  Name: [name] MRN: [id number] Date of Birth: 1930-01-04  Transition [facility]) CM/SW Contact  Almarie CHRISTELLA [place]ie, [place] Phone Number: 05/30/2024, 1:36 PM  Clinical Narrative:   CSW spoke with patient's sons, Garrel and Medford, about recommendation for SNF. Sons in agreement, preference for either [facility] or [facility]. Patient had been in [place] for rehab prior to moving into [place]. CSW to fax out referral after updated therapy notes are entered. CSW updated MD with barriers to discharge.    Expected Discharge Plan: Skilled Nursing Facility Barriers to Discharge: Continued Medical Work up, English As A Second Language Teacher, SNF Pending bed offer               Expected Discharge Plan and Services         Expected Discharge Date: 05/30/24                                     Social Drivers of Health (SDOH) Interventions SDOH Screenings   Food Insecurity: Patient Unable To Answer (05/27/2024)  Housing: Low Risk (04/05/2024)  Transportation Needs: Patient Unable To Answer (04/05/2024)  Utilities: Patient Unable To Answer (04/05/2024)  Depression (PHQ2-9): Low Risk (09/22/2022)  Financial Resource Strain: Low Risk (12/29/2023)   Received from [facility]  Social Connections: Moderately Integrated (04/05/2024)  Tobacco Use: Low Risk (05/25/2024)    Readmission Risk Interventions    04/08/2024   10:37 AM 11/03/2022    1:11 PM  Readmission Risk Prevention Plan  Post Dischage Appt  Complete  Medication Screening  Complete  Transportation Screening Complete Complete  HRI or Home Care Consult Complete   Social Work Consult for Recovery Care Planning/Counseling Complete   Palliative Care Screening Complete   Medication Review Ocean[employer]) Complete

## 2024-05-30 NOTE — Plan of Care (Signed)

## 2024-05-30 NOTE — Plan of Care (Signed)
 Problem: Education: Goal: Ability to describe self-care measures that may prevent or decrease complications (Diabetes Survival Skills Education) will improve Outcome: Progressing Goal: Individualized Educational Video(s) Outcome: Progressing   Problem: Coping: Goal: Ability to adjust to condition or change in health will improve Outcome: Progressing   Problem: Fluid Volume: Goal: Ability to maintain a balanced intake and output will improve Outcome: Progressing   Problem: Health Behavior/Discharge Planning: Goal: Ability to identify and utilize available resources and services will improve Outcome: Progressing Goal: Ability to manage health-related needs will improve Outcome: Progressing   Problem: Metabolic: Goal: Ability to maintain appropriate glucose levels will improve Outcome: Progressing   Problem: Nutritional: Goal: Maintenance of adequate nutrition will improve Outcome: Progressing Goal: Progress toward achieving an optimal weight will improve Outcome: Progressing   Problem: Skin Integrity: Goal: Risk for impaired skin integrity will decrease Outcome: Progressing   Problem: Tissue Perfusion: Goal: Adequacy of tissue perfusion will improve Outcome: Progressing   Problem: Education: Goal: Ability to describe self-care measures that may prevent or decrease complications (Diabetes Survival Skills Education) will improve Outcome: Progressing Goal: Individualized Educational Video(s) Outcome: Progressing   Problem: Cardiac: Goal: Ability to maintain an adequate cardiac output will improve Outcome: Progressing   Problem: Health Behavior/Discharge Planning: Goal: Ability to identify and utilize available resources and services will improve Outcome: Progressing Goal: Ability to manage health-related needs will improve Outcome: Progressing   Problem: Fluid Volume: Goal: Ability to achieve a balanced intake and output will improve Outcome: Progressing    Problem: Metabolic: Goal: Ability to maintain appropriate glucose levels will improve Outcome: Progressing   Problem: Nutritional: Goal: Maintenance of adequate nutrition will improve Outcome: Progressing Goal: Maintenance of adequate weight for body size and type will improve Outcome: Progressing   Problem: Respiratory: Goal: Will regain and/or maintain adequate ventilation Outcome: Progressing   Problem: Urinary Elimination: Goal: Ability to achieve and maintain adequate renal perfusion and functioning will improve Outcome: Progressing   Problem: Education: Goal: Knowledge of General Education information will improve Description: Including pain rating scale, medication(s)/side effects and non-pharmacologic comfort measures Outcome: Progressing   Problem: Health Behavior/Discharge Planning: Goal: Ability to manage health-related needs will improve Outcome: Progressing   Problem: Clinical Measurements: Goal: Ability to maintain clinical measurements within normal limits will improve Outcome: Progressing Goal: Will remain free from infection Outcome: Progressing Goal: Diagnostic test results will improve Outcome: Progressing Goal: Respiratory complications will improve Outcome: Progressing Goal: Cardiovascular complication will be avoided Outcome: Progressing   Problem: Activity: Goal: Risk for activity intolerance will decrease Outcome: Progressing   Problem: Nutrition: Goal: Adequate nutrition will be maintained Outcome: Progressing   Problem: Coping: Goal: Level of anxiety will decrease Outcome: Progressing   Problem: Elimination: Goal: Will not experience complications related to bowel motility Outcome: Progressing Goal: Will not experience complications related to urinary retention Outcome: Progressing   Problem: Pain Managment: Goal: General experience of comfort will improve and/or be controlled Outcome: Progressing   Problem: Safety: Goal:  Ability to remain free from injury will improve Outcome: Progressing   Problem: Skin Integrity: Goal: Risk for impaired skin integrity will decrease Outcome: Progressing   Problem: Education: Goal: Knowledge of disease or condition will improve Outcome: Progressing Goal: Knowledge of secondary prevention will improve (MUST DOCUMENT ALL) Outcome: Progressing Goal: Knowledge of patient specific risk factors will improve (DELETE if not current risk factor) Outcome: Progressing   Problem: Ischemic Stroke/TIA Tissue Perfusion: Goal: Complications of ischemic stroke/TIA will be minimized Outcome: Progressing   Problem: Coping: Goal: Will verbalize  positive feelings about self Outcome: Progressing Goal: Will identify appropriate support needs Outcome: Progressing   Problem: Health Behavior/Discharge Planning: Goal: Ability to manage health-related needs will improve Outcome: Progressing Goal: Goals will be collaboratively established with patient/family Outcome: Progressing

## 2024-05-30 NOTE — Care Management Important Message (Addendum)
 Important Message  Patient Details  Name: [name] MRN: [id number] Date of Birth: May 29, 1929   Important Message Given:  Yes - Medicare IM Patient could not signe due to [employer] 05/30/2024, 2:26 PM

## 2024-05-31 LAB — GLUCOSE, CAPILLARY
Glucose-Capillary: 165 mg/dL — ABNORMAL HIGH (ref 70–99)
Glucose-Capillary: 157 mg/dL — ABNORMAL HIGH (ref 70–99)
Glucose-Capillary: 257 mg/dL — ABNORMAL HIGH (ref 70–99)
Glucose-Capillary: 368 mg/dL — ABNORMAL HIGH (ref 70–99)
Glucose-Capillary: 297 mg/dL — ABNORMAL HIGH (ref 70–99)

## 2024-05-31 LAB — CBC
HCT: 36.5 % (ref 36.0–46.0)
Hemoglobin: 12 g/dL (ref 12.0–15.0)
MCH: 32.1 pg (ref 26.0–34.0)
MCHC: 32.9 g/dL (ref 30.0–36.0)
MCV: 97.6 fL (ref 80.0–100.0)
Platelets: 193 10*3/uL (ref 150–400)
RBC: 3.74 MIL/uL — ABNORMAL LOW (ref 3.87–5.11)
RDW: 12.9 % (ref 11.5–15.5)
WBC: 13.5 10*3/uL — ABNORMAL HIGH (ref 4.0–10.5)
nRBC: 0 % (ref 0.0–0.2)

## 2024-05-31 LAB — CK: Total CK: 70 U/L (ref 38–234)

## 2024-05-31 LAB — CULTURE, BLOOD (ROUTINE X 2)
Culture: NO GROWTH
Culture: NO GROWTH
Special Requests: ADEQUATE

## 2024-05-31 LAB — RENAL FUNCTION PANEL
Albumin: 3.1 g/dL — ABNORMAL LOW (ref 3.5–5.0)
Anion gap: 13 (ref 5–15)
BUN: 20 mg/dL (ref 8–23)
CO2: 23 mmol/L (ref 22–32)
Calcium: 8.7 mg/dL — ABNORMAL LOW (ref 8.9–10.3)
Chloride: 101 mmol/L (ref 98–111)
Creatinine, Ser: 1.53 mg/dL — ABNORMAL HIGH (ref 0.44–1.00)
GFR, Estimated: 32 mL/min — ABNORMAL LOW
Glucose, Bld: 293 mg/dL — ABNORMAL HIGH (ref 70–99)
Phosphorus: 3.7 mg/dL (ref 2.5–4.6)
Potassium: 3.8 mmol/L (ref 3.5–5.1)
Sodium: 137 mmol/L (ref 135–145)

## 2024-05-31 LAB — MAGNESIUM: Magnesium: 2.1 mg/dL (ref 1.7–2.4)

## 2024-05-31 LAB — OLIGOCLONAL BANDS, CSF + SERM

## 2024-05-31 MED ORDER — POTASSIUM CHLORIDE IN NACL 20-0.9 MEQ/L-% IV SOLN
INTRAVENOUS | Status: AC
  Filled 2024-05-31: qty 1000

## 2024-05-31 MED ORDER — INSULIN ASPART 100 UNIT/ML IJ SOLN
0.0000 [IU] | Freq: Every day | INTRAMUSCULAR | Status: AC
  Administered 2024-05-31: 3 [IU] via SUBCUTANEOUS
  Filled 2024-05-31: qty 3

## 2024-05-31 MED ORDER — ACETAMINOPHEN 325 MG PO TABS
650.0000 mg | ORAL_TABLET | Freq: Four times a day (QID) | ORAL | Status: AC
  Administered 2024-05-31 (×2): 650 mg via ORAL
  Filled 2024-05-31 (×2): qty 2

## 2024-05-31 MED ORDER — INSULIN ASPART 100 UNIT/ML IJ SOLN
0.0000 [IU] | Freq: Three times a day (TID) | INTRAMUSCULAR | Status: AC
  Administered 2024-05-31: 9 [IU] via SUBCUTANEOUS
  Filled 2024-05-31: qty 9

## 2024-05-31 NOTE — Progress Notes (Signed)
 Physical Therapy Treatment Patient Details Name: GEANA WALTS MRN: [id number] DOB: 1930/01/25 Today's Date: 05/31/2024   History of Present Illness Pt is a 89 y.o. female who presented due to shaking and increase in L gaze and R side deficits. Pt was admitted due to hyperosmolar  hyperglycemic. 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 EEG showed generalized cerebral dysfunction.  LP completed to rule out meningitis. PMH: DM2, HTN, HLD, CVA    PT Comments  Pt tolerated treatment well today. Pt more alert compared to previous session with PT however still limited by very slow processing and poor command following. Pt noted to be soiled upon arrival requiring total A for pericare however demonstrated improvements in bed mobility  requiring Mod A and up to +2 Max A for step pivot transfer to recliner. No change in DC/DME recs at this time. PT will continue to follow.     If plan is discharge home, recommend the following: Two people to help with walking and/or transfers;Two people to help with bathing/dressing/bathroom;Assistance with feeding;Direct supervision/assist for medications management;Direct supervision/assist for financial management;Assist for transportation;Help with stairs or ramp for entrance;Supervision due to cognitive status   Can travel by private vehicle     No  Equipment Recommendations  Wheelchair (measurements PT);Wheelchair cushion (measurements PT)    Recommendations for Other Services       Precautions / Restrictions Precautions Precautions: Fall Recall of Precautions/Restrictions: Impaired Restrictions Weight Bearing Restrictions Per Provider Order: No     Mobility  Bed Mobility Overal bed mobility: Needs Assistance Bed Mobility: Supine to Sit     Supine to sit: Mod assist     General bed mobility comments: Poor initiation. Increased  cues for sequencing. Mod A to elevate trunk.    Transfers Overall transfer level: Needs assistance Equipment used: 2 person hand held assist Transfers: Sit to/from Stand, Bed to chair/wheelchair/BSC Sit to Stand: +2 physical assistance, Mod assist   Step pivot transfers: +2 physical assistance, Max assist       General transfer comment: +2 Mod A to stand twice for pericare. +2 Max A to step pivot towards recliner  with pt noted to have very heavy posterior lean with shuffling gait pattern.    Ambulation/Gait               General Gait Details: deferred for safety.   Stairs             Wheelchair Mobility     Tilt Bed    Modified Rankin (Stroke Patients Only)       Balance Overall balance assessment: Needs assistance Sitting-balance support: Feet supported, Bilateral upper extremity supported Sitting balance-Leahy Scale: Poor Sitting balance - Comments: CGA to sit EOB.   Standing balance support: Bilateral upper extremity supported, During functional activity Standing balance-Leahy Scale: Zero Standing balance comment: Reliant on therapist                            Communication Communication Communication: Impaired Factors Affecting Communication: Reduced clarity of speech;Difficulty expressing self  Cognition Arousal: Lethargic Behavior During Therapy: Flat affect   PT - Cognitive impairments: History of cognitive impairments, No family/caregiver present to determine baseline, Difficult to assess                       PT - Cognition Comments:  Very flat affect. More alert compared to previous session however still noted with very poor command following. Following commands: Impaired Following commands impaired: Follows one step commands inconsistently    Cueing Cueing Techniques: Verbal cues, Gestural cues, Tactile cues, Visual cues  Exercises      General Comments General comments (skin integrity, edema, etc.): VSS       Pertinent Vitals/Pain Pain Assessment Pain Assessment: Faces Faces Pain Scale: Hurts little more Pain Location: Pt reports pelvic pain Pain Descriptors / Indicators: Discomfort Pain Intervention(s): Monitored during session, Limited activity within patient's tolerance, Repositioned    Home Living                          Prior Function            PT Goals (current goals can now be found in the care plan section) Progress towards PT goals: Progressing toward goals    Frequency    Min 1X/week      PT Plan      Co-evaluation              AM-PAC PT 6 Clicks Mobility   Outcome Measure  Help needed turning from your back to your side while in a flat bed without using bedrails?: Total Help needed moving from lying on your back to sitting on the side of a flat bed without using bedrails?: Total Help needed moving to and from a bed to a chair (including a wheelchair)?: Total Help needed standing up from a chair using your arms (e.g., wheelchair or bedside chair)?: Total Help needed to walk in hospital room?: Total Help needed climbing 3-5 steps with a railing? : Total 6 Click Score: 6    End of Session Equipment Utilized During Treatment: Gait belt Activity Tolerance: Patient limited by lethargy;Patient tolerated treatment well Patient left: in chair;with call bell/phone within reach;with chair alarm set Nurse Communication: Mobility status PT Visit Diagnosis: Unsteadiness on feet (R26.81);Muscle weakness (generalized) (M62.81)     Time: 8954-8894 PT Time Calculation (min) (ACUTE ONLY): 20 min  Charges:    $Therapeutic Activity: 8-22 mins PT General Charges $$ ACUTE PT VISIT: 1 Visit                     Sueellen NOVAK, PT, DPT Acute Rehab Services [phone]    Wilbur Labuda 05/31/2024, 11:51 AM

## 2024-05-31 NOTE — Progress Notes (Signed)
 OT Cancellation Note  Patient Details Name: [name] MRN: [id number] DOB: 1929/06/29   Cancelled Treatment:    Reason Eval/Treat Not Completed: Other (comment) (Imminent order acknowledged. Pt is already on caseload. Pt to continue current POC.)  [name] 05/31/2024, 2:37 PM

## 2024-05-31 NOTE — Progress Notes (Signed)
 " PROGRESS NOTE  [name] FMW:[id number] DOB: 09-Dec-1929   PCP: [name], NP  Patient is from: ALF  DOA: 05/25/2024 LOS: 5  Chief complaints Chief Complaint  Patient presents with   Code Stroke     Brief Narrative / Interim history: 89 year old F with PMH of cognitive impairment, CVA, DM-2, neuropathy, HTN, HLD, CKD-3B and osteoarthritis brought to ED from SNF due to stroke and seizure-like activity.  Reportedly had some shaking with left gaze and right-sided weakness.  She presented as code stroke which was ruled out by neurology.  She was admitted with hyperosmolar hyperglycemic state and seizure-like activity.   In ED, SBP elevated to 190s but improved quickly.  CBG > 600.  Serum osmolality 337.  BMP glucose 683.  Na 132.  Bicarb 23.  AG 13.  Hgb 11.3.  EtOH level undetectable.  CT head without acute finding.  VBG reassuring.  Patient was started on insulin  drip and IV fluid and admitted for HHS and acute metabolic encephalopathy.  Neurology consulted and MRI brain ordered.   MRI brain concerning for acute lacunar infarct within left posterior frontal corona radiata and left centrum seminal ovale at the frontoparietal junction.  Patient continued to spike fever with leukocytosis and persistent somnolence.  ID consulted recommended LP to rule out meningitis.  They also adjusted antibiotics for possible meningitis.  Palliative medicine consulted as well.   Patient underwent fluoroscopy guided LP.  CSF fluid with WBC 48, neutrophilic, glucose 134, total protein 103, serum glucose 201, negative meningitis/encephalitis panel and cultures.  Ampicillin  and Valtrex discontinued.  Remains on ceftriaxone  and vancomycin .  Mental status improved.  Fever resolved.  Leukocytosis improved. CT chest, abdomen and pelvis on 2/4 raise concern for proctitis.  ID recommended p.o. Augmentin  for 7 days.  SLP recommended dysphagia 3 diet.   In regards to stroke, neurology recommended Plavix  and aspirin   for 3 weeks followed by Plavix  alone.   Therapy recommended SNF.  Subjective: Seen and examined earlier this morning and this afternoon.  Patient is awake but not quite as alert as yesterday.  She is oriented to self and place.  She reports pain all over.  [name] son, Medford at bedside this afternoon.  Assessment and plan: Poorly controlled IDDM-2 with hyperosmolar hyperglycemic state and neuropathy:  Due to noncompliance?  A1c 6% (7.2% in 03/2024).  Supposed to be on Lantus  8 units at bedtime. Serum osmolality elevated to 337.   Recent Labs  Lab 05/30/24 2056 05/30/24 2336 05/31/24 0456 05/31/24 0805 05/31/24 1201  GLUCAP 228* 173* 165* 157* 257*  - Increase SSI to sensitive.  Acute metabolic encephalopathy: Multifactorial including HHS, infection (possible meningitis), CVA (MRI positive for acute lacunar CVA), delirium and possible seizure.  Reportedly had shaking and left gaze when she presented to ED.  CT head and CT angio head and neck without acute finding.  Does not seem to have focal neurodeficit but limited exam due to encephalopathy.  VBG reassuring.  RVP, UA, TSH, ammonia and B12 unrevealing.  LTM EEG negative for seizure or epileptiform discharge.  CSF analysis negative.  Encephalopathy resolved.  Awake but not quite alert.  Oriented to self and place and follows some commands.  Waxing and waning -Continue Keppra  for seizure-like activity -Continue dysphagia 3 diet per SLP. -Reorientation and delirium precaution   Acute lacunar infarct/history of CVA: Likely ischemic. MRI brain concerning for acute lacunar infarct within left posterior frontal corona radiata and left centrum seminal ovale at the frontoparietal junction.  CT angio head and neck without LVO.  TTE without significant finding.  LDL 35.  A1c 10.6. -Neurology recommended aspirin  and Plavix  for 3 weeks followed by Plavix  alone -Symptom control as above. -Outpatient follow-up with neurology   Seizure-like activity:  Reportedly had some shaking and left gaze on presentation.  A.m. EEG without definite seizure or epileptiform discharge.  - Continue Keppra  500 mg twice daily per neurology.  SIRS/proctitis: Was febrile with leukocytosis.  Unclear source of infection except proctitis.  CSF analysis does not suggest infection.  UA and CXR unrevealing.  Fever, lactic acidosis and encephalopathy resolved.  Leukocytosis slightly up probably hemoconcentration from dehydration. -ID recommended de-escalating antibiotics to p.o. Augmentin  for 7 days. -Monitor leukocytosis.   Paroxysmal atrial fibrillation: Brief A-fib on telemetry that has resolved. -Continue increased dose of bisoprolol  -Deferred anticoagulation given age and transient A-fib likely provoked   Uncontrolled hypertension: Blood pressure improved.  On Zebeta  at home. - Increased bisoprolol  to 10 mg daily-continue.   AKI on CKD-3B: Unclear b/l but Cr improved to 1.16 before trending up.  CKD-4 ruled out.  At risk for dehydration due to cognitive impairment. - Start IV fluid -Optimize hydration   History of cognitive impairment: History of memory loss and a problem list. -See encephalopathy for management   Hypokalemia/hypomagnesemia: Resolved   Anemia of chronic disease: Hgb at baseline. - Check CBC in 1 week   Myalgia/generalized body pain: This is not acute.  Related to statin? -Scheduled Tylenol  every 6 hours while awake -Check CK  Leukocytosis/bandemia: Slightly up today.  Probably due to hemoconcentration. -Monitor   Hyperlipidemia - Continue statin   Nutrition Body mass index is 19.76 kg/m.           DVT prophylaxis:  enoxaparin  (LOVENOX ) injection 30 mg Start: 05/26/24 1000  Code Status: DNR Family Communication: Updated patient's son, Medford at bedside. Level of care: Progressive Status is: Inpatient Remains inpatient appropriate because: AKI, encephalopathy and dehydration.   Final disposition: SNF   55 minutes  with more than 50% spent in reviewing records, counseling patient/family and coordinating care.  Consultants:  Neurology Infectious disease Palliative medicine  Procedures: Lumbar puncture  Microbiology summarized: COVID-19, influenza and RSV PCR nonreactive A-20 pathogen RVP nonreactive Blood culture NGTD CSF culture NGTD  Objective: Vitals:   05/31/24 0748 05/31/24 0810 05/31/24 0853 05/31/24 1214  BP: (!) 191/68 (!) 180/80 (!) 146/74 (!) 120/48  Pulse: 67   72  Resp: 18   18  Temp: 98.1 F (36.7 C)   (!) 97.3 F (36.3 C)  TempSrc: Oral   Oral  SpO2: 97%   100%  Weight:      Height:        Examination:  GENERAL: No apparent distress.  Nontoxic. HEENT: MMM.  Vision and hearing grossly intact.  NECK: Supple.  No apparent JVD.  RESP:  No IWOB.  Fair aeration bilaterally. CVS:  RRR. Heart sounds normal.  ABD/GI/GU: BS+. Abd soft, NTND.  MSK/EXT:  Moves extremities. No apparent deformity. No edema.  SKIN: no apparent skin lesion or wound NEURO: Awake but not quite alert.  Oriented to self and place.  Follows some commands.  No apparent focal neuro deficit. PSYCH: Calm. Normal affect.   Sch Meds:  Scheduled Meds:  acetaminophen   650 mg Oral Q6H WA   amoxicillin -clavulanate  1 tablet Oral Q12H   aspirin   81 mg Oral Daily   bisoprolol   10 mg Oral Daily   clopidogrel   75 mg Oral Daily  enoxaparin  (LOVENOX ) injection  30 mg Subcutaneous Q24H   feeding supplement  237 mL Oral BID BM   insulin  aspart  0-6 Units Subcutaneous Q4H   levETIRAcetam   500 mg Oral BID   rosuvastatin   10 mg Oral q AM   Continuous Infusions:  0.9 % NaCl with KCl 20 mEq / L      PRN Meds:.dextrose , labetalol , LORazepam , mouth rinse  Antimicrobials: Anti-infectives (From admission, onward)    Start     Dose/Rate Route Frequency Ordered Stop   05/30/24 2200  vancomycin  (VANCOREADY) IVPB 500 mg/100 mL  Status:  Discontinued        500 mg 100 mL/hr over 60 Minutes Intravenous Every 48  hours 05/29/24 0808 05/30/24 0807   05/30/24 1000  cefTRIAXone  (ROCEPHIN ) 2 g in sodium chloride  0.9 % 100 mL IVPB  Status:  Discontinued        2 g 200 mL/hr over 30 Minutes Intravenous Every 24 hours 05/29/24 1538 05/30/24 0807   05/30/24 1000  amoxicillin -clavulanate (AUGMENTIN ) 500-125 MG per tablet 1 tablet        1 tablet Oral Every 12 hours 05/30/24 0807 06/06/24 0959   05/30/24 0000  amoxicillin -clavulanate (AUGMENTIN ) 500-125 MG tablet  Status:  Discontinued        1 tablet Oral Every 12 hours 05/30/24 1128 05/30/24    05/30/24 0000  amoxicillin -clavulanate (AUGMENTIN ) 500-125 MG tablet        1 tablet Oral Every 12 hours 05/30/24 1128 06/06/24 2359   05/28/24 2200  vancomycin  (VANCOREADY) IVPB 500 mg/100 mL  Status:  Discontinued        500 mg 100 mL/hr over 60 Minutes Intravenous Every 24 hours 05/27/24 1336 05/29/24 0808   05/27/24 1800  cefTRIAXone  (ROCEPHIN ) 2 g in sodium chloride  0.9 % 100 mL IVPB  Status:  Discontinued        2 g 200 mL/hr over 30 Minutes Intravenous Every 12 hours 05/27/24 1326 05/29/24 1538   05/27/24 1500  acyclovir  (ZOVIRAX ) 500 mg in dextrose  5 % 100 mL IVPB  Status:  Discontinued        500 mg 110 mL/hr over 60 Minutes Intravenous Every 24 hours 05/27/24 1412 05/28/24 0905   05/27/24 1430  vancomycin  (VANCOCIN ) IVPB 1000 mg/200 mL premix        1,000 mg 200 mL/hr over 60 Minutes Intravenous  Once 05/27/24 1336 05/27/24 1456   05/27/24 1415  ampicillin  (OMNIPEN) 2 g in sodium chloride  0.9 % 100 mL IVPB  Status:  Discontinued        2 g 300 mL/hr over 20 Minutes Intravenous Every 8 hours 05/27/24 1326 05/28/24 0905   05/26/24 1000  linezolid  (ZYVOX ) IVPB 600 mg  Status:  Discontinued        600 mg 300 mL/hr over 60 Minutes Intravenous Every 12 hours 05/26/24 0737 05/27/24 1326   05/26/24 0830  piperacillin -tazobactam (ZOSYN ) IVPB 2.25 g  Status:  Discontinued        2.25 g 100 mL/hr over 30 Minutes Intravenous Every 8 hours 05/26/24 0820 05/27/24  1326        I have personally reviewed the following labs and images: CBC: Recent Labs  Lab 05/25/24 2221 05/25/24 2222 05/27/24 0155 05/28/24 0403 05/29/24 0807 05/30/24 0200 05/31/24 1116  WBC 9.4   < > 19.4* 17.2* 10.8* 10.9* 13.5*  NEUTROABS 5.8  --   --   --   --   --   --   HGB 11.3*   < >  12.8 11.6* 11.0* 11.7* 12.0  HCT 33.0*   < > 36.8 32.6* 31.6* 33.9* 36.5  MCV 97.9   < > 94.8 93.4 94.3 95.0 97.6  PLT 265   < > 182 208 183 191 193   < > = values in this interval not displayed.   BMP &GFR Recent Labs  Lab 05/27/24 0155 05/28/24 0403 05/29/24 0130 05/29/24 0807 05/30/24 0200 05/31/24 1116  NA 138 142  --  141 140 137  K 3.6 2.8*  --  3.5 3.5 3.8  CL 100 105  --  106 104 101  CO2 22 25  --  25 26 23   GLUCOSE 201* 104*  --  85 104* 293*  BUN 19 23  --  14 12 20   CREATININE 1.61* 1.69*  --  1.16* 1.18* 1.53*  CALCIUM  9.4 8.9  --  8.2* 8.6* 8.7*  MG 1.7 1.7 1.5* 1.7 2.6* 2.1  PHOS 2.0* 3.2  --  2.4* 2.5 3.7   Estimated Creatinine Clearance: 18.1 mL/min (A) (by C-G formula based on SCr of 1.53 mg/dL (H)). Liver & Pancreas: Recent Labs  Lab 05/25/24 2221 05/27/24 0155 05/28/24 0403 05/29/24 0807 05/30/24 0200 05/31/24 1116  AST 38 44*  --   --  36  --   ALT 22 18  --   --  17  --   ALKPHOS 150* 84  --   --  67  --   BILITOT 0.3 0.7  --   --  0.4  --   PROT 7.1 7.1  --   --  6.0*  --   ALBUMIN 3.8 3.8 3.3* 3.0* 3.2* 3.1*   No results for input(s): LIPASE, AMYLASE in the last 168 hours. Recent Labs  Lab 05/26/24 0805  AMMONIA 19   Diabetic: No results for input(s): HGBA1C in the last 72 hours.  Recent Labs  Lab 05/30/24 2056 05/30/24 2336 05/31/24 0456 05/31/24 0805 05/31/24 1201  GLUCAP 228* 173* 165* 157* 257*   Cardiac Enzymes: Recent Labs  Lab 05/26/24 1403 05/27/24 0155  CKTOTAL 222 505*   No results for input(s): PROBNP in the last 8760 hours. Coagulation Profile: Recent Labs  Lab 05/25/24 2221  INR 1.0    Thyroid  Function Tests: No results for input(s): TSH, T4TOTAL, FREET4, T3FREE, THYROIDAB in the last 72 hours.  Lipid Profile: No results for input(s): CHOL, HDL, LDLCALC, TRIG, CHOLHDL, LDLDIRECT in the last 72 hours.  Anemia Panel: No results for input(s): VITAMINB12, FOLATE, FERRITIN, TIBC, IRON, RETICCTPCT in the last 72 hours.  Urine analysis:    Component Value Date/Time   COLORURINE STRAW (A) 05/26/2024 0542   APPEARANCEUR CLEAR 05/26/2024 0542   LABSPEC 1.027 05/26/2024 0542   PHURINE 6.0 05/26/2024 0542   GLUCOSEU >=500 (A) 05/26/2024 0542   HGBUR NEGATIVE 05/26/2024 0542   BILIRUBINUR NEGATIVE 05/26/2024 0542   KETONESUR NEGATIVE 05/26/2024 0542   PROTEINUR 100 (A) 05/26/2024 0542   UROBILINOGEN 0.2 07/29/2012 1237   NITRITE NEGATIVE 05/26/2024 0542   LEUKOCYTESUR NEGATIVE 05/26/2024 0542   Sepsis Labs: Invalid input(s): PROCALCITONIN, LACTICIDVEN  Microbiology: Recent Results (from the past 240 hours)  Culture, blood (x 2)     Status: None   Collection Time: 05/26/24  5:08 AM   Specimen: BLOOD LEFT HAND  Result Value Ref Range Status   Specimen Description BLOOD LEFT HAND  Final   Special Requests   Final    BOTTLES DRAWN AEROBIC ONLY Blood Culture results may not be optimal due  to an inadequate volume of blood received in culture bottles   Culture   Final    NO GROWTH 5 DAYS Performed at [facility], [address], [place], [place] [address]    Report Status 05/31/2024 FINAL  Final  Resp panel by RT-PCR (RSV, Flu A&B, Covid)     Status: None   Collection Time: 05/26/24  5:10 AM   Specimen: Nasal Swab  Result Value Ref Range Status   SARS Coronavirus 2 by RT PCR NEGATIVE NEGATIVE Final   Influenza A by PCR NEGATIVE NEGATIVE Final   Influenza B by PCR NEGATIVE NEGATIVE Final    Comment: (NOTE) The Xpert Xpress SARS-CoV-2/FLU/RSV plus assay is intended as an aid in the diagnosis of influenza from  Nasopharyngeal swab specimens and should not be used as a sole basis for treatment. Nasal washings and aspirates are unacceptable for Xpert Xpress SARS-CoV-2/FLU/RSV testing.  Fact Sheet for Patients: bloggercourse.com  Fact Sheet for Healthcare Providers: seriousbroker.it  This test is not yet approved or cleared by the [place]  FDA and has been authorized for detection and/or diagnosis of SARS-CoV-2 by FDA under an Emergency Use Authorization (EUA). This EUA will remain in effect (meaning this test can be used) for the duration of the COVID-19 declaration under Section 564(b)(1) of the Act, 21 U.S.C. section 360bbb-3(b)(1), unless the authorization is terminated or revoked.     Resp Syncytial Virus by PCR NEGATIVE NEGATIVE Final    Comment: (NOTE) Fact Sheet for Patients: bloggercourse.com  Fact Sheet for Healthcare Providers: seriousbroker.it  This test is not yet approved or cleared by the [place]  FDA and has been authorized for detection and/or diagnosis of SARS-CoV-2 by FDA under an Emergency Use Authorization (EUA). This EUA will remain in effect (meaning this test can be used) for the duration of the COVID-19 declaration under Section 564(b)(1) of the Act, 21 U.S.C. section 360bbb-3(b)(1), unless the authorization is terminated or revoked.  Performed at [facility], [address], [place], [place] [address]   Culture, blood (x 2)     Status: None   Collection Time: 05/26/24  5:13 AM   Specimen: BLOOD  Result Value Ref Range Status   Specimen Description BLOOD RIGHT ANTECUBITAL  Final   Special Requests   Final    BOTTLES DRAWN AEROBIC AND ANAEROBIC Blood Culture adequate volume   Culture   Final    NO GROWTH 5 DAYS Performed at [facility], [address], [place], [place] [address]    Report Status 05/31/2024 FINAL  Final  Respiratory  (~20 pathogens) panel by PCR     Status: None   Collection Time: 05/26/24  7:34 AM   Specimen: Nasopharyngeal Swab; Respiratory  Result Value Ref Range Status   Adenovirus NOT DETECTED NOT DETECTED Final   Coronavirus 229E NOT DETECTED NOT DETECTED Final    Comment: (NOTE) The Coronavirus on the Respiratory Panel, DOES NOT test for the novel  Coronavirus (2019 nCoV)    Coronavirus HKU1 NOT DETECTED NOT DETECTED Final   Coronavirus NL63 NOT DETECTED NOT DETECTED Final   Coronavirus OC43 NOT DETECTED NOT DETECTED Final   Metapneumovirus NOT DETECTED NOT DETECTED Final   Rhinovirus / Enterovirus NOT DETECTED NOT DETECTED Final   Influenza A NOT DETECTED NOT DETECTED Final   Influenza B NOT DETECTED NOT DETECTED Final   Parainfluenza Virus 1 NOT DETECTED NOT DETECTED Final   Parainfluenza Virus 2 NOT DETECTED NOT DETECTED Final   Parainfluenza  Virus 3 NOT DETECTED NOT DETECTED Final   Parainfluenza Virus 4 NOT DETECTED NOT DETECTED Final   Respiratory Syncytial Virus NOT DETECTED NOT DETECTED Final   Bordetella pertussis NOT DETECTED NOT DETECTED Final   Bordetella Parapertussis NOT DETECTED NOT DETECTED Final   Chlamydophila pneumoniae NOT DETECTED NOT DETECTED Final   Mycoplasma pneumoniae NOT DETECTED NOT DETECTED Final    Comment: Performed at [facility], [address], [place], [place] [address]  CSF culture w Gram Stain     Status: None   Collection Time: 05/27/24  4:10 PM   Specimen: PATH Cytology CSF; Cerebrospinal Fluid  Result Value Ref Range Status   Specimen Description CSF  Final   Special Requests NONE  Final   Gram Stain   Final    WBC PRESENT, PREDOMINANTLY PMN NO ORGANISMS SEEN CYTOSPIN SMEAR    Culture   Final    NO GROWTH 3 DAYS Performed at [facility], [address] [place], [place] [address]    Report Status 05/30/2024 FINAL  Final  Anaerobic culture w Gram Stain     Status: None (Preliminary result)   Collection Time: 05/27/24  4:40 PM    Specimen: Back; Cerebrospinal Fluid  Result Value Ref Range Status   Specimen Description CSF  Final   Special Requests   Final    NONE Performed at [facility], [address], [place], [place] [address]    Culture   Final    NO ANAEROBES ISOLATED; CULTURE IN PROGRESS FOR 5 DAYS   Report Status PENDING  Incomplete    Radiology Studies: No results found.     Tanda Morrissey T. Breionna Punt [facility]  If 7PM-7AM, please contact night-coverage [email or web address] 05/31/2024, 3:01 PM   "

## 2024-05-31 NOTE — Plan of Care (Signed)
" °  Problem: Coping: Goal: Ability to adjust to condition or change in health will improve Outcome: Progressing   Problem: Fluid Volume: Goal: Ability to maintain a balanced intake and output will improve Outcome: Progressing   Problem: Health Behavior/Discharge Planning: Goal: Ability to manage health-related needs will improve Outcome: Progressing   Problem: Metabolic: Goal: Ability to maintain appropriate glucose levels will improve Outcome: Progressing   Problem: Nutritional: Goal: Maintenance of adequate nutrition will improve Outcome: Progressing   Problem: Skin Integrity: Goal: Risk for impaired skin integrity will decrease Outcome: Progressing   Problem: Health Behavior/Discharge Planning: Goal: Ability to manage health-related needs will improve Outcome: Progressing   Problem: Clinical Measurements: Goal: Will remain free from infection Outcome: Progressing   Problem: Activity: Goal: Risk for activity intolerance will decrease Outcome: Progressing   Problem: Elimination: Goal: Will not experience complications related to bowel motility Outcome: Progressing   Problem: Elimination: Goal: Will not experience complications related to urinary retention Outcome: Progressing   Problem: Safety: Goal: Ability to remain free from injury will improve Outcome: Progressing   "

## 2024-05-31 NOTE — Progress Notes (Signed)
 Speech Language Pathology Treatment: Dysphagia  Patient Details Name: [name] MRN: [id number] DOB: 19-Jul-1929 Today's Date: 05/31/2024 Time: 8888-8866 SLP Time Calculation (min) (ACUTE ONLY): 22 min  Assessment / Plan / Recommendation Clinical Impression  Pt appeared lethargic during session, and became increasingly drowsy during SLP session. Pt consumed thin liquid with instances of delayed coughing 2x. However, adjusting to upright posture resolved instances of coughing. Pt consumed regular texture w/o overt s/s of aspiration. SLP reviewed general swallow precautions w/ pt.   PLAN: Recommend continuing current diet. Suspect pt able to manage regular diet, however Dys 3 to decrease risks associated with inattention. Continue general swallow precautions. Defer SLP f/u to next level of care.     HPI HPI: [name] is a 89 y.o. female who presented to the hospital on 05/26/24 from SNF due to stroke and seizure-like activity including shaking and left gaze, right sided weakness. MRI brain concerning for acute lacunar infarct within left posterior frontal corona radiata and left centrum seminal ovale at the frontoparietal junction. CTH without acute finding. Neurology consulted. She was admitted with acute metabolic encephalopathy, seizure like activity. She underwent LP due to possible meningitis. She was kept NPO and SLP ordered for swallow and speech-language cognitive evaluation. PMH: macular degeneration, CVA's, DM-2, neuropathy, HLD, OA.      SLP Plan  Continue with current plan of care        Swallow Evaluation Recommendations   Recommendations: PO diet PO Diet Recommendation: Dysphagia 3 (Mechanical soft);Thin liquids (Level 0) Liquid Administration via: Cup;Straw Medication Administration: Whole meds with puree Supervision: Staff to assist with self-feeding;Intermittent supervision/cueing for swallowing strategies Postural changes: Position pt fully upright for meals Oral  care recommendations: Oral care BID (2x/day)     Recommendations                     Oral care BID   Frequent or constant Supervision/Assistance Dysphagia, unspecified (R13.10)     Continue with current plan of care     Rocky Quan, Student SLP  05/31/2024, 12:48 PM

## 2024-05-31 NOTE — TOC Progression Note (Addendum)
 Transition of Care [facility]) - Progression Note    Patient Details  Name: [name] MRN: [id number] Date of Birth: 1930-03-02  [facility] of [facility] CM/SW Contact  Almarie CHRISTELLA [place], [place] Phone Number: 05/31/2024, 12:06 PM  Clinical Narrative:   CSW coordinated with PT assigned, and faxed out referral once note was entered. CSW contacted [facility] and [facility] to review. CSW to follow.  UPDATE 3:12 PM: Patient received bed offer at [facility], [facility] referral is still pending. CSW met with son, Medford, at bedside to provide update. Chris in agreement with [facility] when patient is stable. Per MD, possibly stable over the weekend. CSW contacted C[facility] to request insurance authorization. CSW to follow.    Expected Discharge Plan: Skilled Nursing Facility Barriers to Discharge: Continued Medical Work up, English As A Second Language Teacher, SNF Pending bed offer               Expected Discharge Plan and Services         Expected Discharge Date: 05/31/24                                     Social Drivers of Health (SDOH) Interventions SDOH Screenings   Food Insecurity: Patient Unable To Answer (05/27/2024)  Housing: Low Risk (04/05/2024)  Transportation Needs: Patient Unable To Answer (04/05/2024)  Utilities: Patient Unable To Answer (04/05/2024)  Depression (PHQ2-9): Low Risk (09/22/2022)  Financial Resource Strain: Low Risk (12/29/2023)   Received from [facility]  Social Connections: Moderately Integrated (04/05/2024)  Tobacco Use: Low Risk (05/25/2024)    Readmission Risk Interventions    04/08/2024   10:37 AM 11/03/2022    1:11 PM  Readmission Risk Prevention Plan  Post Dischage Appt  Complete  Medication Screening  Complete  Transportation Screening Complete Complete  HRI or Home Care Consult Complete   Social Work Consult for Recovery Care Planning/Counseling Complete   Palliative Care Screening Complete   Medication Review Oceanographer) Complete

## 2024-05-31 NOTE — Plan of Care (Signed)
 Family at bedside. Encouraged her to have her drinks and foods. Seems sleepy and lethargic most of the time and informed to doctor.   Problem: Education: Goal: Ability to describe self-care measures that may prevent or decrease complications (Diabetes Survival Skills Education) will improve Outcome: Progressing   Problem: Coping: Goal: Ability to adjust to condition or change in health will improve Outcome: Progressing   Problem: Fluid Volume: Goal: Ability to maintain a balanced intake and output will improve Outcome: Progressing   Problem: Metabolic: Goal: Ability to maintain appropriate glucose levels will improve Outcome: Progressing   Problem: Nutritional: Goal: Maintenance of adequate nutrition will improve Outcome: Progressing   Problem: Skin Integrity: Goal: Risk for impaired skin integrity will decrease Outcome: Progressing   Problem: Tissue Perfusion: Goal: Adequacy of tissue perfusion will improve Outcome: Progressing   Problem: Nutritional: Goal: Maintenance of adequate nutrition will improve Outcome: Progressing   Problem: Nutritional: Goal: Maintenance of adequate weight for body size and type will improve Outcome: Progressing   Problem: Clinical Measurements: Goal: Will remain free from infection Outcome: Progressing   Problem: Clinical Measurements: Goal: Diagnostic test results will improve Outcome: Progressing   Problem: Activity: Goal: Risk for activity intolerance will decrease Outcome: Progressing   Problem: Nutrition: Goal: Adequate nutrition will be maintained Outcome: Progressing   Problem: Coping: Goal: Level of anxiety will decrease Outcome: Progressing   Problem: Skin Integrity: Goal: Risk for impaired skin integrity will decrease Outcome: Progressing   Problem: Safety: Goal: Ability to remain free from injury will improve Outcome: Progressing   Problem: Coping: Goal: Will verbalize positive feelings about self Outcome:  Progressing
